# Patient Record
Sex: Male | Born: 1970 | Race: Black or African American | Hispanic: No | Marital: Married | Smoking: Former smoker
Health system: Southern US, Community
[De-identification: ages and names within clinical notes are randomized; demographics above are authoritative.]

## PROBLEM LIST (undated history)

## (undated) DIAGNOSIS — J189 Pneumonia, unspecified organism: Secondary | ICD-10-CM

## (undated) DIAGNOSIS — K219 Gastro-esophageal reflux disease without esophagitis: Secondary | ICD-10-CM

## (undated) DIAGNOSIS — E669 Obesity, unspecified: Secondary | ICD-10-CM

## (undated) DIAGNOSIS — K828 Other specified diseases of gallbladder: Secondary | ICD-10-CM

## (undated) DIAGNOSIS — G43909 Migraine, unspecified, not intractable, without status migrainosus: Secondary | ICD-10-CM

## (undated) DIAGNOSIS — K5090 Crohn's disease, unspecified, without complications: Secondary | ICD-10-CM

## (undated) DIAGNOSIS — Z8669 Personal history of other diseases of the nervous system and sense organs: Secondary | ICD-10-CM

## (undated) DIAGNOSIS — G4730 Sleep apnea, unspecified: Secondary | ICD-10-CM

## (undated) DIAGNOSIS — R0600 Dyspnea, unspecified: Secondary | ICD-10-CM

## (undated) DIAGNOSIS — T7840XA Allergy, unspecified, initial encounter: Secondary | ICD-10-CM

## (undated) DIAGNOSIS — E041 Nontoxic single thyroid nodule: Secondary | ICD-10-CM

## (undated) DIAGNOSIS — K911 Postgastric surgery syndromes: Secondary | ICD-10-CM

## (undated) DIAGNOSIS — I10 Essential (primary) hypertension: Secondary | ICD-10-CM

## (undated) DIAGNOSIS — Z87442 Personal history of urinary calculi: Secondary | ICD-10-CM

## (undated) DIAGNOSIS — F419 Anxiety disorder, unspecified: Secondary | ICD-10-CM

## (undated) DIAGNOSIS — N200 Calculus of kidney: Secondary | ICD-10-CM

## (undated) DIAGNOSIS — J3489 Other specified disorders of nose and nasal sinuses: Secondary | ICD-10-CM

## (undated) HISTORY — DX: Other specified diseases of gallbladder: K82.8

## (undated) HISTORY — PX: FLEXIBLE SIGMOIDOSCOPY: SHX1649

## (undated) HISTORY — PX: UPPER GASTROINTESTINAL ENDOSCOPY: SHX188

## (undated) HISTORY — DX: Calculus of kidney: N20.0

## (undated) HISTORY — DX: Gastro-esophageal reflux disease without esophagitis: K21.9

## (undated) HISTORY — DX: Sleep apnea, unspecified: G47.30

## (undated) HISTORY — DX: Allergy, unspecified, initial encounter: T78.40XA

## (undated) HISTORY — DX: Migraine, unspecified, not intractable, without status migrainosus: G43.909

## (undated) HISTORY — DX: Obesity, unspecified: E66.9

## (undated) HISTORY — DX: Personal history of other diseases of the nervous system and sense organs: Z86.69

## (undated) HISTORY — PX: ESOPHAGOGASTRODUODENOSCOPY: SHX1529

## (undated) HISTORY — DX: Pneumonia, unspecified organism: J18.9

## (undated) HISTORY — DX: Crohn's disease, unspecified, without complications: K50.90

## (undated) HISTORY — PX: SHOULDER SURGERY: SHX246

## (undated) HISTORY — DX: Nontoxic single thyroid nodule: E04.1

## (undated) HISTORY — DX: Other specified disorders of nose and nasal sinuses: J34.89

## (undated) HISTORY — DX: Anxiety disorder, unspecified: F41.9

## (undated) HISTORY — PX: VASECTOMY: SHX75

## (undated) HISTORY — DX: Essential (primary) hypertension: I10

---
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
## 2000-12-28 ENCOUNTER — Encounter: Admission: RE | Admit: 2000-12-28 | Discharge: 2001-01-30 | Payer: Self-pay | Admitting: *Deleted

## 2001-09-18 ENCOUNTER — Ambulatory Visit (HOSPITAL_COMMUNITY): Admission: RE | Admit: 2001-09-18 | Discharge: 2001-09-18 | Payer: Self-pay | Admitting: Gastroenterology

## 2002-04-09 ENCOUNTER — Encounter: Payer: Self-pay | Admitting: Emergency Medicine

## 2002-04-09 ENCOUNTER — Emergency Department (HOSPITAL_COMMUNITY): Admission: EM | Admit: 2002-04-09 | Discharge: 2002-04-09 | Payer: Self-pay | Admitting: Emergency Medicine

## 2002-04-12 ENCOUNTER — Encounter: Payer: Self-pay | Admitting: Emergency Medicine

## 2002-04-12 ENCOUNTER — Emergency Department (HOSPITAL_COMMUNITY): Admission: EM | Admit: 2002-04-12 | Discharge: 2002-04-12 | Payer: Self-pay | Admitting: Emergency Medicine

## 2003-07-04 HISTORY — PX: MULTIPLE TOOTH EXTRACTIONS: SHX2053

## 2004-12-29 ENCOUNTER — Emergency Department (HOSPITAL_COMMUNITY): Admission: EM | Admit: 2004-12-29 | Discharge: 2004-12-30 | Payer: Self-pay | Admitting: Emergency Medicine

## 2006-12-16 ENCOUNTER — Emergency Department (HOSPITAL_COMMUNITY): Admission: EM | Admit: 2006-12-16 | Discharge: 2006-12-16 | Payer: Self-pay | Admitting: Family Medicine

## 2007-02-09 ENCOUNTER — Ambulatory Visit: Payer: Self-pay | Admitting: Family Medicine

## 2007-02-09 ENCOUNTER — Inpatient Hospital Stay (HOSPITAL_COMMUNITY): Admission: EM | Admit: 2007-02-09 | Discharge: 2007-02-11 | Payer: Self-pay | Admitting: Emergency Medicine

## 2007-02-10 ENCOUNTER — Encounter: Payer: Self-pay | Admitting: Gastroenterology

## 2007-02-13 ENCOUNTER — Ambulatory Visit: Payer: Self-pay | Admitting: Gastroenterology

## 2009-07-03 HISTORY — PX: CHOLECYSTECTOMY: SHX55

## 2009-11-04 LAB — LIPID PANEL
HDL: 27 mg/dL — AB (ref 35–70)
LDL Cholesterol: 90 mg/dL
Triglycerides: 133 mg/dL (ref 40–160)

## 2009-12-02 ENCOUNTER — Ambulatory Visit (HOSPITAL_COMMUNITY): Admission: RE | Admit: 2009-12-02 | Discharge: 2009-12-02 | Payer: Self-pay | Admitting: Gastroenterology

## 2010-01-17 ENCOUNTER — Ambulatory Visit (HOSPITAL_COMMUNITY): Admission: RE | Admit: 2010-01-17 | Discharge: 2010-01-17 | Payer: Self-pay | Admitting: Urology

## 2010-09-18 LAB — COMPREHENSIVE METABOLIC PANEL
Albumin: 3.5 g/dL (ref 3.5–5.2)
Alkaline Phosphatase: 96 U/L (ref 39–117)
Calcium: 9.2 mg/dL (ref 8.4–10.5)
Chloride: 106 mEq/L (ref 96–112)
GFR calc Af Amer: >60 mL/min (ref >60)
GFR calc non Af Amer: >60 mL/min (ref >60)
Glucose, Bld: 97 mg/dL (ref 70–99)
Sodium: 141 mEq/L (ref 135–145)
Total Bilirubin: 0.4 mg/dL (ref 0.3–1.2)

## 2010-09-18 LAB — SURGICAL PCR SCREEN: Staphylococcus aureus: NEGATIVE

## 2010-09-18 LAB — DIFFERENTIAL
Basophils Absolute: 0 10*3/uL (ref 0.0–0.1)
Eosinophils Absolute: 0.2 10*3/uL (ref 0.0–0.7)
Lymphs Abs: 1.6 10*3/uL (ref 0.7–4.0)
Monocytes Relative: 17 % — ABNORMAL HIGH (ref 3–12)
Neutro Abs: 1.7 10*3/uL (ref 1.7–7.7)

## 2010-09-18 LAB — CBC
HCT: 43.7 % (ref 39.0–52.0)
MCH: 29.4 pg (ref 26.0–34.0)
MCV: 84.9 fL (ref 78.0–100.0)
Platelets: 188 10*3/uL (ref 150–400)
RDW: 13.9 % (ref 11.5–15.5)

## 2010-11-15 NOTE — H&P (Signed)
Craig Guzman, Craig Guzman                ACCOUNT NO.:  [id number]   MEDICAL RECORD NO.:  [id number]          PATIENT TYPE:  INP   LOCATION:  3086                         FACILITY:  [facility]   PHYSICIAN:  Kasandra Knudsen, M.D.   DATE OF BIRTH:  08/05/1970   DATE OF ADMISSION:  02/09/2007  DATE OF DISCHARGE:                              HISTORY & PHYSICAL   HISTORY OF PRESENT ILLNESS:  This is a 40 year old male with vomiting  blood and coffee grounds starting at 11:30 p.m.  He has vomited multiple  times, and cannot quantify.  This has never happened to him before.  He  was scheduled to see Dr. Collene Mares soon for a questionable EGD.  He has acid  reflux.  He felt normal self yesterday.  He complains of abdominal pain  that is mild.  He denies diarrhea.  He feels like something is caught in  his throat.  The patient is very difficult to understand due to his  mumbling.  His wife came later into the room and said that he has been  nauseous for 2 weeks with about every other day vomiting, decreased p.o.  intake and weight loss.   PAST MEDICAL HISTORY:  1. GERD.  2. Hypertension.  3. History of an antral ulcer, although it was gone on repeat EGD.   MEDICATIONS:  1. Lisinopril 20/25.  2. Verapamil 180 p.o. daily.  3. Nexium.   ALLERGIES:  ZITHROMAX, causes high blood pressure.   PAST SURGICAL HISTORY:  Endoscopy.   FAMILY HISTORY:  He has a dad with stomach problems, and his dad had an  MI in his 51s and a mild stroke.   SOCIAL HISTORY:  Lives with his wife and three kids.  He works at  [employer].  He smokes about one pack every one and a half  days.  Denies alcohol now, though he drank socially in the past.   REVIEW OF SYSTEMS:  See HPI.  In addition, he has noted weight loss, but  says he is trying to lose weight.  Normal bowel movements, no blood, no  tarry stools.  No chest pain.   PHYSICAL EXAMINATION:  VITAL SIGNS:  Temperature 97.6, heart rate 93,  blood pressure 145/94,  respiratory rate 20.  GENERAL:  Not in acute distress; however, ill and uncomfortable  appearing.  HEENT:  Pupils equal, round and react to light, extraocular muscles  intact.  The patient has dry mucous membranes.  There is mild erythema  in his throat.  No exudate.  Per the ED physician, there is edema of the  uvula; however, I was unable to examine the patient, as he refused exam  with tongue depressor.  CARDIOVASCULAR:  Regular rate and rhythm.  No rubs, gallops or murmurs.  PULMONARY:  Clear to auscultation bilaterally.  ABDOMEN:  Obese, soft, mild tender to palpation in the epigastric area.  No rebound, no guarding.  Hypoactive bowel sounds.  EXTREMITIES:  No edema, and 2+ pulses.  MUSCULOSKELETAL:  5/5 strength bilaterally in the upper and lower  extremities.  GU:  Deferred secondary to the patient's  vomiting, unable to tolerate.  SKIN:  No lesions, no contusions.  NEURO:  Alert and oriented x3.  Cranial nerves II-XII intact.   LABORATORY EVALUATION:  Gastric contents were heme positive.  Labs are  as follows:  Sodium 142, potassium 3.4, chloride 106, bicarb 26, BUN 15,  creatinine 1.3, glucose 100, AST elevated at 52, ALT 30, alk phos 92,  total bili 0.8, lipase 22.  White blood cells 7.3, hemoglobin 15.6,  hematocrit 46.9, platelets 229.   ASSESSMENT AND PLAN:  This is a 40 year old male with hematemesis.  1. Hematemesis:  Differential includes gastritis, peptic ulcer      disease, viral infection, food poisoning, Mallory-Weiss tear.      Hemodynamically stable with normal hemoglobin and hematocrit.  We      will keep n.p.o.  We will do a D5 one-half normal saline and 125      for maintenance fluids.  The patient has already received 1 liter      of fluids.  Heme positive of gastric contents could be from all of      the above.  We will start Protonix 40 b.i.d., as well as Zantac q.6      hours.  We are going to check a complete blood count in the morning      to check for  stability of hemoglobin and hematocrit.  Likely, we      will call gastrointestinal in the morning, as the patient was      established already with Dr. Collene Mares in the past.      Esophagogastroduodenoscopy may help [place] look for ulcers, tears and      gastritis.  It appears that the patient would benefit from      inpatient studies since he has been worse of late.  Zofran for      nausea and vomiting.  2. Nausea and vomiting:  Zofran 4 mg intravenous p.r.n. and p.o. with      intravenous fluids.  3. Uvula edema:  Given Benadryl in the emergency department.  Stable      without air compromise; however, we will give one dose of Solu-      Medrol now and repeat as necessary to decrease inflammation.      Telemetry bed for monitoring.  4. Bradycardia:  Mild.  We will get an electrocardiogram at 0800 to      make sure that the patient does not have prolonged QT secondary to      Zofran, as initially the patient was not bradycardic.  5. Hypertension:  Mild increased blood pressure, but the patient has      been actively vomiting.  We are going to hold the blood pressure      medicines while he is n.p.o.  If needed, we can give intravenous      hydralazine or intravenous Vasotec.  6. Prophylaxis:  Sequential compression devices and Protonix.      Kasandra Knudsen, M.D.  Electronically Signed     JT/MEDQ  D:  02/09/2007  T:  02/10/2007  Job:  [id number]

## 2010-11-18 NOTE — Discharge Summary (Signed)
Craig Guzman, Craig Guzman                ACCOUNT NO.:  [id number]   MEDICAL RECORD NO.:  [id number]          PATIENT TYPE:  INP   LOCATION:  5020                         FACILITY:  [facility]   PHYSICIAN:  Jamal Collin. Hensel, M.D.DATE OF BIRTH:  1970/08/13   DATE OF ADMISSION:  02/09/2007  DATE OF DISCHARGE:  02/11/2007                               DISCHARGE SUMMARY   PRIMARY CARE PHYSICIAN:  Dr. Carlota Raspberry at one of the urgency care centers.   CONSULTANTS:  GI, Nelwyn Salisbury, M.D.   PROCEDURES:  The patient had an EGD, which showed 2 antral ulcers and a  hiatal hernia, approximately 1 cm.  Biopsies were taken, and a CLOtest  was taken and is pending.   PERTINENT LABORATORIES:  The patient's hemoglobin on admission was 14.4,  has decreased to 13.4 and then 12.9 on discharge.  The patient glucose  was mildly elevated at 109 and 101, and this can be followed up his  primary care Donnette Macmullen.  The patient also had low albumin of 3.1 on day 2  of his hospitalization.   PRIMARY DIAGNOSES:  GI bleed with 2 antral ulcers identified.   SECONDARY DIAGNOSES:  1. Hiatal hernia.  2. Hypertension.   HOSPITAL COURSE:  1. Hematemesis and GI bleed:  The patient was admitted to the hospital      with history of nausea, vomiting, and hematemesis.  He underwent an      upper endoscopy, which showed 2 antral ulcers.  Biopsies were      taken.  CLO testing for H. pylori was done, and test is still      pending, and the patient was placed on Protonix 40 mg p.o. b.i.d.,      and then after the EGD was started on clears, and his diet was      advanced, which he tolerated well.  Also, on the day of discharge,      sucralfate was started per GI's recommendation.  2. Nausea and vomiting:  The patient was given Zofran, and his diet      was advanced.  By the time of discharge, the patient was taking      p.o. well.  3. Uvula edema:  The patient's uvula was noted to be somewhat      edematous.  He was placed on  Benadryl.  There was never any airway      compromise, and this problem resolved.  4. Bradycardia:  The patient's heart rate was in the high 40s to low      60s.  EKG showed sinus bradycardia but was otherwise normal.  There      was no increased QT.  No action was taken for this.  5. Hypertension:  The patient's blood pressure meds were held during      his hospital stay.  His blood pressure did begin to creep up      somewhat the last day of his hospital stay, up to the 150s/90s.      The patient was discontinued on his home medication regimen.  6. Hypokalemia:  The patient was  noted to be hypokalemic.  This was      repleted, and this problem resolved.   PENDING TEST RESULTS AT THE TIME OF DISCHARGE:  The CLO test for H.  pylori is pending.   CONDITION AT THE TIME OF DISCHARGE:  Stable.   DISPOSITION:  The patient was discharged to home.   DISCHARGE FOLLOWUP:  The patient was instructed to follow up with Dr.  Collene Mares, GI doctor, on September 2 at 10 a.m.  The patient was also  instructed to follow up with Dr. Carlota Raspberry at the urgent care, and the  patient was told to go back within 1 week to see Dr. Carlota Raspberry.   FOLLOWUP ISSUES:  Continued management of patient's blood pressure  medicines and monitoring of the bradycardia that we saw during this  admission and followup for patient's antral ulcers.   DISCHARGE MEDICATIONS:  The patient was discharged on the following  medications:  1. Lisinopril 20 mg p.o. daily.  2. Hydrochlorothiazide 25 mg p.o. daily.  3. Verapamil 180 mg p.o. daily.  4. Sucralfate 1 g by mouth 4 times a day, take 1 hour before meals and      at bedtime.  5. Omeprazole 40 mg tab, take 1 tab twice a day through August 13,      then take one tab per day.      Carin Hock, MD  Electronically Signed      Jamal Collin. Andria Frames, M.D.  Electronically Signed    SO/MEDQ  D:  02/12/2007  T:  02/12/2007  Job:  [id number]   cc:   Earleen Newport, M.D.

## 2010-11-18 NOTE — Procedures (Signed)
[place]  Patient:    Craig Guzman, Craig Guzman Visit Number: [id number] MRN: [id number]          Service Type: END Location: ENDO Attending Physician:  Juanita Craver Dictated by:   Nelwyn Salisbury, M.D. Proc. Date: 09/18/01 Admit Date:  09/18/2001   CC:         Sharene Butters, M.D.   Procedure Report  DATE OF BIRTH:  [birth year]  REFERRING PHYSICIAN:  Sharene Butters, M.D.  PROCEDURE PERFORMED:  Esophagogastroduodenoscopy.  ENDOSCOPIST:  Nelwyn Salisbury, M.D.  INSTRUMENT USED:  Olympus video panendoscope.  INDICATIONS FOR PROCEDURE:  The patient is a 40 year old African-American male with a history of an antral ulcer seen on a recent upper gastrointestinal series.  EGD is being done to further evaluate the patients symptoms and possibly do biopsies for Helicobacter pylori.  The patient has a history of heavy nonsteroidal use including Goody powders in the past.  PREPROCEDURE PREPARATION:  Informed consent was procured from the patient. The patient was fasted for eight hours prior to the procedure.  PREPROCEDURE PHYSICAL:  The patient is a morbidly obese African-American male in no acute distress.  The patient had stable vital signs.  Neck supple. Chest clear to auscultation.  S1, S2 regular.  Abdomen soft with normal bowel sounds.  Epigastric tenderness on palpation with minimal guarding.  No rebound, no rigidity, no hepatosplenomegaly.  DESCRIPTION OF PROCEDURE:  The patient was placed in left lateral decubitus position and sedated with 130 mg of Demerol and 13 mg of Versed intravenously. Once the patient was adequately sedated and maintained on low-flow oxygen and continuous cardiac monitoring, the Olympus video panendoscope was advanced through the mouthpiece, over the tongue, into the esophagus under direct vision.  The entire esophagus appeared normal and without lesions.  The scope was then advanced into the stomach.  There was  prominent antral gastritis with no masses or polyps seen.  The proximal small bowel appeared normal. Retroflexion was not possible as the patient was combative and pulled the scope out.  Biopsies could not be done because the patient was not cooperative.  IMPRESSION: 1. Normal-appearing esophagus and proximal small bowel. 2. Antral gastritis. 2. No ulcers or masses seen.  RECOMMENDATION: 1. Continue  proton pump inhibitors. 2. Avoid all nonsteroidals. 3. Check serology for Helicobacter pylori infection. 4. Outpatient follow-up in the next two weeks. 5. Avoid all nonsteroidals including aspirin. Dictated by:   Nelwyn Salisbury, M.D. Attending Physician:  Juanita Craver DD:  09/18/01 TD:  09/18/01 Job: [id number]

## 2011-03-29 LAB — HEPATIC FUNCTION PANEL
Alkaline Phosphatase: 90 U/L (ref 25–125)
Bilirubin, Total: 0.5 mg/dL

## 2011-04-05 ENCOUNTER — Other Ambulatory Visit: Payer: Self-pay | Admitting: Internal Medicine

## 2011-04-07 ENCOUNTER — Ambulatory Visit
Admission: RE | Admit: 2011-04-07 | Discharge: 2011-04-07 | Disposition: A | Discharge status: Home / Self Care | Payer: 59 | Source: Ambulatory Visit | Attending: Internal Medicine | Admitting: Internal Medicine

## 2011-04-12 ENCOUNTER — Encounter: Payer: Self-pay | Admitting: Internal Medicine

## 2011-04-17 LAB — DIFFERENTIAL
Basophils Absolute: 0
Basophils Relative: 0
Eosinophils Absolute: 0.2
Monocytes Relative: 10
Neutrophils Relative %: 65

## 2011-04-17 LAB — CBC
HCT: 38.5 — ABNORMAL LOW
HCT: 42.5
HCT: 46.9
Hemoglobin: 12.9 — ABNORMAL LOW
Hemoglobin: 13.4
Hemoglobin: 14.4
Hemoglobin: 15.6
MCHC: 33.3
MCHC: 34
MCV: 84.8
MCV: 85.6
Platelets: 178
Platelets: 204
Platelets: 206
RBC: 4.65
RBC: 5.05
RDW: 14
RDW: 14.1 — ABNORMAL HIGH
WBC: 5.4
WBC: 8.9

## 2011-04-17 LAB — TYPE AND SCREEN
ABO/RH(D): O POS
Antibody Screen: NEGATIVE

## 2011-04-17 LAB — BASIC METABOLIC PANEL
BUN: 7
Chloride: 110
GFR calc Af Amer: >60
GFR calc non Af Amer: >60
Glucose, Bld: 101 — ABNORMAL HIGH
Glucose, Bld: 116 — ABNORMAL HIGH
Potassium: 3.6
Potassium: 3.9
Sodium: 140

## 2011-04-17 LAB — COMPREHENSIVE METABOLIC PANEL
BUN: 11
CO2: 26
Calcium: 8.6
Chloride: 108
Creatinine, Ser: 0.88
GFR calc Af Amer: >60
Glucose, Bld: 109 — ABNORMAL HIGH
Total Bilirubin: 0.6

## 2011-04-17 LAB — I-STAT 8, (EC8 V) (CONVERTED LAB)
BUN: 15
Chloride: 106
Glucose, Bld: 100 — ABNORMAL HIGH
Operator id: 192351

## 2011-04-17 LAB — HEPATIC FUNCTION PANEL
AST: 52 — ABNORMAL HIGH
Bilirubin, Direct: 0.1
Indirect Bilirubin: 0.7

## 2011-04-17 LAB — CLOTEST (H. PYLORI), BIOPSY: Helicobacter screen: NEGATIVE

## 2011-04-17 LAB — GASTRIC OCCULT BLOOD (1-CARD TO LAB): Occult Blood, Gastric: POSITIVE — AB

## 2011-04-17 LAB — AMYLASE: Amylase: 85

## 2011-04-17 LAB — H. PYLORI ANTIBODY, IGG: H Pylori IgG: 0.6

## 2011-04-21 ENCOUNTER — Ambulatory Visit (INDEPENDENT_AMBULATORY_CARE_PROVIDER_SITE_OTHER): Payer: 59 | Admitting: Internal Medicine

## 2011-04-21 ENCOUNTER — Encounter: Payer: Self-pay | Admitting: Internal Medicine

## 2011-04-21 VITALS — BP 146/84 | HR 72 | Ht 74.0 in | Wt 287.0 lb

## 2011-04-21 DIAGNOSIS — K219 Gastro-esophageal reflux disease without esophagitis: Secondary | ICD-10-CM

## 2011-04-21 DIAGNOSIS — R1110 Vomiting, unspecified: Secondary | ICD-10-CM

## 2011-04-21 DIAGNOSIS — R6881 Early satiety: Secondary | ICD-10-CM

## 2011-04-21 NOTE — Progress Notes (Signed)
  Subjective:    Patient ID: Craig Guzman, male    DOB: 06-11-1971, 40 y.o.   MRN: [id number]  HPI 40 yo African-American man previously followed by Dr. Collene Mares for GERD. He wanted to establish with a new gastroenterologist. He is only able to "eat a certain amount". He has difficulty with regurgitating some food at the end of a meal. Chronic and intermittent. Dr. Collene Mares switched PPI therapy. He has had an EGD in past - last time had two ulcers with GI bleed. Some intermittent nausea at times, especially in early AM. Had cholecystectomy 20011 - biliary dyskinesia - with partial benefit. Has been once excellent for about 2 years now. No other GI complaints. Has had some headaches also - related to vomiting - 2 weeks ago. Migraine headaches x 5-6 years.  Has been going to school full-time and working full time. Reduced class load with headaches.  Eats small meals, irregular fashion. Weight fluctuates.  Review of Systems For allergies, back pain, fatigue insomnia. He also has anxiety issues. Describe up with what he calls migraine headaches. He said neck muscular injections for those. Topiramate was started in the summer the dose was reduced after cause some shakiness and increased anxiety and poor concentration. Followed [facility]. All other review of systems negative.    Objective:   Physical Exam General: Obese Well-developed, well-nourished and in no acute distress Vitals: Reviewed and listed above Eyes:anicteric. Mouth and posterior pharynx: normal.  Neck: supple w/o thyromegaly or mass.  Lungs: clear. Heart: S1S2, no rubs, murmurs, gallops. Abdomen: soft, non-tender, no hepatosplenomegaly, hernia, or mass and BS+. No splash Lymphatics: no cervical, Ava nodes. Extremities:  no edema Skin no rash. Neuro: nonfocal. A&O x 3.  Psych: appropriate mood and  affect.        Assessment & Plan:  Constellation of early satiety, regurgitation and reflux. History of peptic  ulcer disease though he abused and states in the past. Last endoscopy was then thousand 22. This is all in the setting of headaches, reported to be migraines. Etiology of this is not clear. Question if he has some form of gastroparesis or gastric dysrhythmia perhaps.   I'm going to have him do a gastric emptying study and review the records from his former gastroenterologist and make further determinations pending that. Possibilities include prokinetic agents such as metoclopramide though would try to avoid using those long-term. Possibility of using buspirone for functional dyspepsia problems was mentioned to the patient.

## 2011-04-21 NOTE — Patient Instructions (Addendum)
You have been given a separate informational sheet regarding your tobacco use, the importance of quitting and local resources to help you quit. You have been scheduled for a Gastric Emptying Study at [facility] on [address]. 05/02/11 at 1:00 pm, please arrive at the Radiology Dept. on the 1st floor at 12:45 pm.  Have nothing to eat or drink at least 8 hours prior to the test. Stop any stomach medications (Dexilant) 24 hours prior to test. We will call you with results and plans.

## 2011-05-02 ENCOUNTER — Encounter (HOSPITAL_COMMUNITY)
Admission: RE | Admit: 2011-05-02 | Discharge: 2011-05-02 | Disposition: A | Discharge status: Home / Self Care | Payer: 59 | Source: Ambulatory Visit | Attending: Internal Medicine | Admitting: Internal Medicine

## 2011-05-02 DIAGNOSIS — R112 Nausea with vomiting, unspecified: Secondary | ICD-10-CM | POA: Insufficient documentation

## 2011-05-02 DIAGNOSIS — K3189 Other diseases of stomach and duodenum: Secondary | ICD-10-CM | POA: Insufficient documentation

## 2011-05-02 DIAGNOSIS — R1013 Epigastric pain: Secondary | ICD-10-CM | POA: Insufficient documentation

## 2011-05-02 DIAGNOSIS — R6881 Early satiety: Secondary | ICD-10-CM

## 2011-05-02 DIAGNOSIS — K219 Gastro-esophageal reflux disease without esophagitis: Secondary | ICD-10-CM

## 2011-05-02 MED ORDER — TECHNETIUM TC 99M SULFUR COLLOID
2.0000 | Freq: Once | INTRAVENOUS | Status: AC | PRN
  Administered 2011-05-02: 2 via INTRAVENOUS

## 2011-05-02 NOTE — Progress Notes (Signed)
Quick Note:  Stomach not emptying well = ? Poor squeezing vs. Obstruction  He needs an EGD to evaluate the vomiting and early satiety and GERD - to exclude a blockage - please arrange [facility] is ok ______

## 2011-05-03 ENCOUNTER — Other Ambulatory Visit: Payer: Self-pay

## 2011-05-04 ENCOUNTER — Encounter: Payer: Self-pay | Admitting: Internal Medicine

## 2011-05-04 ENCOUNTER — Ambulatory Visit (AMBULATORY_SURGERY_CENTER): Payer: 59 | Admitting: Internal Medicine

## 2011-05-04 VITALS — BP 141/75 | HR 58 | Temp 97.0°F | Resp 18 | Ht 74.0 in | Wt 270.0 lb

## 2011-05-04 DIAGNOSIS — R933 Abnormal findings on diagnostic imaging of other parts of digestive tract: Secondary | ICD-10-CM

## 2011-05-04 DIAGNOSIS — K2970 Gastritis, unspecified, without bleeding: Secondary | ICD-10-CM

## 2011-05-04 DIAGNOSIS — R1110 Vomiting, unspecified: Secondary | ICD-10-CM

## 2011-05-04 DIAGNOSIS — R6881 Early satiety: Secondary | ICD-10-CM

## 2011-05-04 DIAGNOSIS — K219 Gastro-esophageal reflux disease without esophagitis: Secondary | ICD-10-CM

## 2011-05-04 DIAGNOSIS — K2940 Chronic atrophic gastritis without bleeding: Secondary | ICD-10-CM

## 2011-05-04 MED ORDER — SODIUM CHLORIDE 0.9 % IV SOLN: 500.0000 mL | INTRAVENOUS | Status: DC

## 2011-05-04 MED ORDER — METOCLOPRAMIDE HCL 10 MG PO TABS: 10.0000 mg | ORAL_TABLET | Freq: Three times a day (TID) | ORAL | Status: DC

## 2011-05-04 NOTE — Patient Instructions (Addendum)
The stomach looked inflamed and irritated. That is called gastritis. I took biopsies to understand it better and we will call with those results and further plans. Start metaclopramide - new medication to help with nausea and vomiting. Gatha Mayer, MD, [facility] Please refer to the neon green sheet for instructions regarding activity for the rest of today.  Handout on gastritis and a soft diet. Resume previous medications.

## 2011-05-05 ENCOUNTER — Telehealth: Payer: Self-pay

## 2011-05-05 NOTE — Telephone Encounter (Signed)

## 2011-05-12 ENCOUNTER — Telehealth: Payer: Self-pay | Admitting: Internal Medicine

## 2011-05-12 NOTE — Telephone Encounter (Signed)
Patient c.o cramping with reglan 10 mg.  He has been cutting it in half and that seems to be helping. He will continue 5 mg ac meals and will call back next week if still having problems or continued cramping.

## 2011-05-17 ENCOUNTER — Encounter: Payer: Self-pay | Admitting: Internal Medicine

## 2011-05-17 DIAGNOSIS — K5000 Crohn's disease of small intestine without complications: Secondary | ICD-10-CM | POA: Insufficient documentation

## 2011-05-17 DIAGNOSIS — D71 Functional disorders of polymorphonuclear neutrophils: Secondary | ICD-10-CM

## 2011-05-17 NOTE — Progress Notes (Signed)
This encounter was created in error - please disregard.

## 2011-05-17 NOTE — Progress Notes (Signed)
Quick Note:  Please have him get: 1) anti-neutrophilic cytoplasmic antibody panel 2) angiotensin converting enzyme level 3) CBC 4) ESR 5) VDRL 6) place PPD  All to evaluate the granulomatous gastritis - looking for cause  Use chronic granulomatous disease as dx 9on prob list) ______

## 2011-05-17 NOTE — Progress Notes (Signed)
Quick Note:  He has an inflammatory gastritis "granulomatous"  Cause is not clear  I am investigating more as to what other testing may be needed  He needs REV with me December ______

## 2011-05-18 ENCOUNTER — Other Ambulatory Visit: Payer: Self-pay

## 2011-05-18 DIAGNOSIS — D71 Functional disorders of polymorphonuclear neutrophils: Secondary | ICD-10-CM

## 2011-05-19 ENCOUNTER — Other Ambulatory Visit (INDEPENDENT_AMBULATORY_CARE_PROVIDER_SITE_OTHER): Payer: 59

## 2011-05-19 ENCOUNTER — Encounter (INDEPENDENT_AMBULATORY_CARE_PROVIDER_SITE_OTHER): Payer: 59 | Admitting: Internal Medicine

## 2011-05-19 DIAGNOSIS — D719 Functional disorders of polymorphonuclear neutrophils, unspecified: Secondary | ICD-10-CM

## 2011-05-19 DIAGNOSIS — D71 Functional disorders of polymorphonuclear neutrophils: Secondary | ICD-10-CM

## 2011-05-19 DIAGNOSIS — K219 Gastro-esophageal reflux disease without esophagitis: Secondary | ICD-10-CM

## 2011-05-19 LAB — CBC WITH DIFFERENTIAL/PLATELET
Basophils Absolute: 0 10*3/uL (ref 0.0–0.1)
Eosinophils Absolute: 0.2 10*3/uL (ref 0.0–0.7)
HCT: 42.6 % (ref 39.0–52.0)
Lymphs Abs: 1.7 10*3/uL (ref 0.7–4.0)
MCHC: 33.9 g/dL (ref 30.0–36.0)
MCV: 85.4 fl (ref 78.0–100.0)
Monocytes Absolute: 0.8 10*3/uL (ref 0.1–1.0)
Monocytes Relative: 15.3 % — ABNORMAL HIGH (ref 3.0–12.0)
Platelets: 200 10*3/uL (ref 150.0–400.0)
RDW: 14.8 % — ABNORMAL HIGH (ref 11.5–14.6)

## 2011-05-19 LAB — SEDIMENTATION RATE: Sed Rate: 17 mm/hr (ref 0–22)

## 2011-05-20 LAB — RPR

## 2011-05-20 LAB — ANGIOTENSIN CONVERTING ENZYME: Angiotensin-Converting Enzyme: 6 U/L — ABNORMAL LOW (ref 8–52)

## 2011-05-21 NOTE — Progress Notes (Signed)
This encounter was created in error - please disregard.

## 2011-05-22 ENCOUNTER — Emergency Department (HOSPITAL_COMMUNITY)
Admission: EM | Admit: 2011-05-22 | Discharge: 2011-05-23 | Disposition: A | Discharge status: Home / Self Care | Payer: 59 | Attending: Emergency Medicine | Admitting: Emergency Medicine

## 2011-05-22 ENCOUNTER — Encounter (HOSPITAL_COMMUNITY): Payer: Self-pay | Admitting: *Deleted

## 2011-05-22 ENCOUNTER — Encounter: Payer: Self-pay | Admitting: Internal Medicine

## 2011-05-22 DIAGNOSIS — H53149 Visual discomfort, unspecified: Secondary | ICD-10-CM | POA: Insufficient documentation

## 2011-05-22 DIAGNOSIS — E669 Obesity, unspecified: Secondary | ICD-10-CM | POA: Insufficient documentation

## 2011-05-22 DIAGNOSIS — I10 Essential (primary) hypertension: Secondary | ICD-10-CM | POA: Insufficient documentation

## 2011-05-22 DIAGNOSIS — R110 Nausea: Secondary | ICD-10-CM | POA: Insufficient documentation

## 2011-05-22 DIAGNOSIS — K219 Gastro-esophageal reflux disease without esophagitis: Secondary | ICD-10-CM | POA: Insufficient documentation

## 2011-05-22 DIAGNOSIS — G43909 Migraine, unspecified, not intractable, without status migrainosus: Secondary | ICD-10-CM | POA: Insufficient documentation

## 2011-05-22 DIAGNOSIS — K2940 Chronic atrophic gastritis without bleeding: Secondary | ICD-10-CM | POA: Insufficient documentation

## 2011-05-22 LAB — TB SKIN TEST
Induration: 0
TB Skin Test: NEGATIVE mm

## 2011-05-22 NOTE — ED Notes (Signed)
Headache since 1845 he has headaches.  Nausea no vomiting.  He has to have shots for the pain

## 2011-05-23 MED ORDER — PROMETHAZINE HCL 25 MG/ML IJ SOLN
25.0000 mg | Freq: Once | INTRAMUSCULAR | Status: AC
  Administered 2011-05-23: 25 mg via INTRAMUSCULAR
  Filled 2011-05-23: qty 1

## 2011-05-23 MED ORDER — KETOROLAC TROMETHAMINE 30 MG/ML IJ SOLN
60.0000 mg | Freq: Once | INTRAMUSCULAR | Status: AC
  Administered 2011-05-23: 60 mg via INTRAMUSCULAR
  Filled 2011-05-23 (×2): qty 1

## 2011-05-23 NOTE — ED Notes (Signed)
Pt states that he has a history of migraines. Pt states that he has had a HA since 18:30 this afternoon. Pt states that he tried to take his migraine medication with no relief. Pt states that his HA wraps around his head and is mainly in his occipital area. Pt states he is having light sensitivity and noise sensitivity. Pt alert and oriented and able to follow commands and move extremities.

## 2011-05-23 NOTE — ED Provider Notes (Addendum)
History     CSN: [id number] Arrival date & time: 05/22/2011 10:19 PM   First MD Initiated Contact with Patient 05/23/11 0052      Chief Complaint  Patient presents with  . Headache    (Consider location/radiation/quality/duration/timing/severity/associated sxs/prior treatment) HPI This is a 40 year old black male with a history of migraines. He had a migraine onset yesterday evening about 6:30. The pain is like previous migraines, moderate to severe in intensity. The pain is present in his for head bilaterally and is dull. It is also present in his parietal areas bilaterally which is throbbing. The headache is associated with photophobia and nausea but no vomiting. He denies any focal neurologic deficits. He is not aware of any visual changes but states he has not been wearing his glasses since the headache started so his vision is blurry. He states his headaches usually resolve with cyclobenzaprine, which he took. When this is our he states he usually gets relief with Phenergan and Toradol IM.  Past Medical History  Diagnosis Date  . GERD (gastroesophageal reflux disease)   . HTN (hypertension)   . Gastric ulcer     antral  . Hiatal hernia   . Biliary dyskinesia   . Anxiety   . Obesity   . History of migraine headaches   . Chronic gastritis     granulomatous  . Head ache     Past Surgical History  Procedure Date  . Cholecystectomy 2011    [place]  . Vasectomy     bilateral w/lysis of penale adhesions  . Esophagogastroduodenoscopy 2008, 05/04/11    2008:antral ulcers, hiatal hernia 2012: granulomatous gastritis    Family History  Problem Relation Age of Onset  . Colon polyps Father   . Diabetes Father   . Colon cancer Neg Hx     History  Substance Use Topics  . Smoking status: Current Everyday Smoker -- 0.5 packs/day for 10 years  . Smokeless tobacco: Never Used   Comment: Counseling sheet given in exam room   . Alcohol Use: No      Review of Systems    All other systems reviewed and are negative.    Allergies  Zithromax  Home Medications   Current Outpatient Rx  Name Route Sig Dispense Refill  . CITALOPRAM HYDROBROMIDE 20 MG PO TABS  One tablet by mouth once daily     . CYCLOBENZAPRINE HCL 10 MG PO TABS  As needed     . DEXILANT 60 MG PO CPDR  One daily     . LISINOPRIL-HYDROCHLOROTHIAZIDE 20-12.5 MG PO TABS Oral Take 2 tablets by mouth daily.      Marland Kitchen METOCLOPRAMIDE HCL 10 MG PO TABS Oral Take 5 mg by mouth 3 (three) times daily.      Marland Kitchen ONDANSETRON 4 MG PO TBDP  As needed     . TOPIRAMATE 25 MG PO TABS  50 mg at bedtime     . VERAPAMIL HCL 180 MG (CO) PO TB24 Oral Take 180 mg by mouth 2 (two) times daily.       BP 137/82  Pulse 55  Temp(Src) 98.1 F (36.7 C) (Oral)  Resp 16  SpO2 99%  Physical Exam General: Well-developed, well-nourished male in no acute distress; appearance consistent with age of record HENT: normocephalic, atraumatic Eyes: Photophobia Neck: supple Heart: regular rate and rhythm Lungs: clear to auscultation bilaterally Abdomen: soft; nondistended Extremities: No deformity; full range of motion Neurologic: Awake, alert and oriented; motor function intact in all  extremities and symmetric; no facial droop; normal speech and coordination Skin: Warm and dry     ED Course  Procedures (including critical care time)    MDM  2:40 AM Feels much better after IM Phenergan and Toradol. States is related to home. Photophobia is resolved.        Wynetta Fines, MD 05/23/11 [place], MD 05/23/11 [phone]

## 2011-05-24 ENCOUNTER — Telehealth: Payer: Self-pay | Admitting: Internal Medicine

## 2011-05-24 MED ORDER — PREDNISONE 10 MG PO TABS: 40.0000 mg | ORAL_TABLET | Freq: Every day | ORAL | Status: AC

## 2011-05-24 NOTE — Telephone Encounter (Signed)
I explained that lab work-up for granulomatous gastritis was negative at this tie. We may need additional ANCA antibodies but he is still struggling with epigastric pain and vomiting.  Metaclopramide helps some.  He is also now constipated.  1) Prednisone 40 mg day to start and they are to call in 1 week with update 2) MiraLax twice a day 3) Liquid, soft diet discussed.

## 2011-06-07 ENCOUNTER — Ambulatory Visit (INDEPENDENT_AMBULATORY_CARE_PROVIDER_SITE_OTHER): Payer: 59 | Admitting: Internal Medicine

## 2011-06-07 ENCOUNTER — Encounter: Payer: Self-pay | Admitting: Internal Medicine

## 2011-06-07 DIAGNOSIS — D71 Functional disorders of polymorphonuclear neutrophils: Secondary | ICD-10-CM

## 2011-06-07 DIAGNOSIS — K5900 Constipation, unspecified: Secondary | ICD-10-CM | POA: Insufficient documentation

## 2011-06-07 MED ORDER — DICYCLOMINE HCL 20 MG PO TABS: 20.0000 mg | ORAL_TABLET | Freq: Four times a day (QID) | ORAL | Status: DC | PRN

## 2011-06-07 MED ORDER — POLYETHYLENE GLYCOL 3350 17 GM/SCOOP PO POWD: 17.0000 g | Freq: Every day | ORAL | Status: AC

## 2011-06-07 NOTE — Patient Instructions (Addendum)
You have been given a Gastroparesis Diet to review and follow, please follow step 3. Your prescription(s) has(have) been sent to your pharmacy for you to pick up (Bentyl). Try taking at bedtime to see if that helps. You have been started on Miralax please take as prescribed on medication list. You can purchase this over the counter. Return to see [name] in 1 month. You have been given a work note for today

## 2011-06-07 NOTE — Progress Notes (Signed)
Subjective:    Patient ID: Craig Guzman, male    DOB: 05/31/1971, 40 y.o.   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 I had put him on metoclopramide initially but that caused a fair amount of cramping. He eventually stopped that. The cramping is better though not gone. He is still having problems in the middle night for a couple of hours which is helped by eating some food. He is not vomiting. He thinks the prednisone is helping since starting that about 10 days ago. He is constipated only moving his bowels every few days or so which has been a problem for a while but has become more apparent. He feels drained after a bowel movement. He denies any blood in the stool or rectal bleeding. Size of stool is smaller and sticky. Using apples and apple juice to help with constipation. Allergies  Allergen Reactions  . Zithromax (Azithromycin) Hypertension   Outpatient Prescriptions Prior to Visit  Medication Sig Dispense Refill  . citalopram (CELEXA) 20 MG tablet One tablet by mouth once daily       . cyclobenzaprine (FLEXERIL) 10 MG tablet As needed       . DEXILANT 60 MG capsule One daily       . lisinopril-hydrochlorothiazide (PRINZIDE,ZESTORETIC) 20-12.5 MG per tablet Take 2 tablets by mouth daily.        . ondansetron (ZOFRAN-ODT) 4 MG disintegrating tablet As needed       . topiramate (TOPAMAX) 25 MG tablet 50 mg at bedtime       . verapamil (COVERA HS) 180 MG (CO) 24 hr tablet Take 180 mg by mouth 2 (two) times daily.       . metoCLOPramide (REGLAN) 10 MG tablet Take 5 mg by mouth 3 (three) times daily.         Past Medical History  Diagnosis Date  . GERD (gastroesophageal reflux disease)   . HTN (hypertension)   . Gastric ulcer     antral  . Hiatal hernia   . Biliary dyskinesia   . Anxiety   . Obesity   . History of migraine headaches   . Chronic gastritis     granulomatous   Past Surgical History  Procedure Date  . Cholecystectomy 2011      Rosenbower  . Vasectomy     bilateral w/lysis of penale adhesions  . Esophagogastroduodenoscopy 05/04/11    gastritis   History   Social History  . Marital Status: Married    Spouse Name: N/A    Number of Children: 3  . Years of Education: N/A   Occupational History  . [employer]    Social History Main Topics  . Smoking status: Current Everyday Smoker -- 0.5 packs/day for 10 years    Types: Cigarettes  . Smokeless tobacco: Never Used   Comment: Counseling sheet given in exam room   . Alcohol Use: No  . Drug Use: No  . Sexually Active: None   Other Topics Concern  . None   Social History Narrative  . None   Family History  Problem Relation Age of Onset  . Colon polyps Father   . Diabetes Father   . Colon cancer Neg Hx         Review of Systems Some insomnia worsened with the prednisone    Objective:   Physical Exam Obese no acute distress Abdomen is soft nontender bowel sounds are present       Assessment &  Plan:  Granulomatous gastritis is working diagnosis. He may be responding to the prednisone. We'll continue that, treat the constipation and see him in a month. I will confer with another gastroenterologist about this diagnosis as well. She problem oriented charting also.

## 2011-06-07 NOTE — Assessment & Plan Note (Signed)
Improved somewhat on the prednisone. Not certain if this is cause-and-effect yet we'll continue that and try to find out more about this disorder and by consulting the [facility] physician over the phone first.

## 2011-06-07 NOTE — Assessment & Plan Note (Signed)
This problem raises the question of other bowel involvement. For the time being we are going to continue the prednisone and had been using MiraLax. Colonoscopy may be indicated here.

## 2011-06-22 ENCOUNTER — Telehealth: Payer: Self-pay | Admitting: Internal Medicine

## 2011-06-22 DIAGNOSIS — R109 Unspecified abdominal pain: Secondary | ICD-10-CM

## 2011-06-22 NOTE — Telephone Encounter (Signed)
Spoke to wife Patient still having upper abdominal pain and nausea and vomiting Constipation still a problem  Remains on prednisone 40 mg daily but he did skip a couple of days ? If making him worse  Working about 2 out of every 5 days work week   Told his wife we will order a CT scan abdomen and pelvis with IV and oral contrast re: abdominal pain (upper - 789,09) with nausea and vomiting and constipation  Has a granulomatous gastritis on EGD

## 2011-06-23 NOTE — Telephone Encounter (Signed)
Patient is scheduled for a CT scan on 06/26/11.  He will come pick up instructions and contrast.

## 2011-06-26 ENCOUNTER — Ambulatory Visit (INDEPENDENT_AMBULATORY_CARE_PROVIDER_SITE_OTHER)
Admission: RE | Admit: 2011-06-26 | Discharge: 2011-06-26 | Disposition: A | Discharge status: Home / Self Care | Payer: 59 | Source: Ambulatory Visit | Attending: Internal Medicine | Admitting: Internal Medicine

## 2011-06-26 DIAGNOSIS — R109 Unspecified abdominal pain: Secondary | ICD-10-CM

## 2011-06-26 DIAGNOSIS — K5900 Constipation, unspecified: Secondary | ICD-10-CM

## 2011-06-26 DIAGNOSIS — R112 Nausea with vomiting, unspecified: Secondary | ICD-10-CM

## 2011-06-26 MED ORDER — IOHEXOL 300 MG/ML  SOLN
100.0000 mL | Freq: Once | INTRAMUSCULAR | Status: AC | PRN
  Administered 2011-06-26: 100 mL via INTRAVENOUS

## 2011-06-26 NOTE — Progress Notes (Signed)
Quick Note:  This looks ok Needs to stay on current dose of prednisone on a daily basis I know he has an REV coming up but I think EGD and colonoscopy to follow-up the granulomatous gastritis and evaluate change in bowel habits ? Crohn's disease) is going to make sense later in January - he could schedule that instead of the REV if he wants   ______

## 2011-06-28 ENCOUNTER — Inpatient Hospital Stay: Admission: RE | Admit: 2011-06-28 | Payer: 59 | Source: Ambulatory Visit

## 2011-07-11 ENCOUNTER — Ambulatory Visit: Payer: 59 | Admitting: Internal Medicine

## 2011-07-18 ENCOUNTER — Ambulatory Visit (AMBULATORY_SURGERY_CENTER): Payer: 59 | Admitting: *Deleted

## 2011-07-18 ENCOUNTER — Encounter: Payer: Self-pay | Admitting: Internal Medicine

## 2011-07-18 VITALS — Ht 74.0 in | Wt 305.0 lb

## 2011-07-18 DIAGNOSIS — K5900 Constipation, unspecified: Secondary | ICD-10-CM

## 2011-07-18 DIAGNOSIS — Z1211 Encounter for screening for malignant neoplasm of colon: Secondary | ICD-10-CM

## 2011-07-18 DIAGNOSIS — D71 Functional disorders of polymorphonuclear neutrophils: Secondary | ICD-10-CM

## 2011-07-18 MED ORDER — PEG-KCL-NACL-NASULF-NA ASC-C 100 G PO SOLR: ORAL | Status: DC

## 2011-07-26 ENCOUNTER — Encounter: Payer: Self-pay | Admitting: Internal Medicine

## 2011-07-26 ENCOUNTER — Ambulatory Visit (AMBULATORY_SURGERY_CENTER): Payer: 59 | Admitting: Internal Medicine

## 2011-07-26 VITALS — BP 138/96 | HR 73 | Resp 14 | Ht 74.0 in | Wt 305.0 lb

## 2011-07-26 DIAGNOSIS — K5289 Other specified noninfective gastroenteritis and colitis: Secondary | ICD-10-CM

## 2011-07-26 DIAGNOSIS — K5900 Constipation, unspecified: Secondary | ICD-10-CM

## 2011-07-26 DIAGNOSIS — K2990 Gastroduodenitis, unspecified, without bleeding: Secondary | ICD-10-CM

## 2011-07-26 DIAGNOSIS — R198 Other specified symptoms and signs involving the digestive system and abdomen: Secondary | ICD-10-CM

## 2011-07-26 DIAGNOSIS — K2970 Gastritis, unspecified, without bleeding: Secondary | ICD-10-CM

## 2011-07-26 DIAGNOSIS — D71 Functional disorders of polymorphonuclear neutrophils: Secondary | ICD-10-CM

## 2011-07-26 DIAGNOSIS — K633 Ulcer of intestine: Secondary | ICD-10-CM

## 2011-07-26 DIAGNOSIS — Z1211 Encounter for screening for malignant neoplasm of colon: Secondary | ICD-10-CM

## 2011-07-26 HISTORY — PX: COLONOSCOPY: SHX174

## 2011-07-26 MED ORDER — PREDNISONE 10 MG PO TABS: 20.0000 mg | ORAL_TABLET | Freq: Every day | ORAL | Status: DC

## 2011-07-26 MED ORDER — SODIUM CHLORIDE 0.9 % IV SOLN: 500.0000 mL | INTRAVENOUS | Status: DC

## 2011-07-26 NOTE — Patient Instructions (Addendum)
The stomach inflammation looked better. There were two tiny areas of inflammation seen in the small and large intestine. Once the biopsies are back we will call. I sent another prednisone prescription to your pharmacy for 20 mg daily. Do not stop that unless I tell you to - call if ?'s about that. Gatha Mayer, MD, [facility]  Green and blue discharge instructions reviewed with patient and care partner.  Resume medications as you were taking them prior to your procedure. Take prednisone as prescribe by [name].

## 2011-07-26 NOTE — Progress Notes (Signed)
Patient did not experience any of the following events: a burn prior to discharge; a fall within the facility; wrong site/side/patient/procedure/implant event; or a hospital transfer or hospital admission upon discharge from the facility. (G8907) Patient did not have preoperative order for IV antibiotic SSI prophylaxis. (G8918)  

## 2011-07-26 NOTE — Progress Notes (Signed)
The pt was gagging and coughing with the scope advancement down the esophagus.  Medications were ordered by [name].  The tolerated the exam. Maw  The pt tolerated the colonoscopy well. maw

## 2011-07-26 NOTE — Op Note (Signed)
[facility]. [place], [place]  [id number]  COLONOSCOPY PROCEDURE REPORT  PATIENT:  Craig Guzman, Craig Guzman  MR#:  [id number] BIRTHDATE:  Sep 06, 1970, 40 yrs. old  GENDER:  male ENDOSCOPIST:  Gatha Mayer, MD, [facility]  PROCEDURE DATE:  07/26/2011 PROCEDURE:  Colonoscopy with biopsy ASA CLASS:  Class II INDICATIONS:  change in bowel habits ? Crohn's disease with granulomatous gastritis known MEDICATIONS:   These medications were titrated to patient response per physician's verbal order, There was residual sedation effect present from prior procedure., Fentanyl 50 mcg IV, Versed 4 mg IV  DESCRIPTION OF PROCEDURE:   After the risks benefits and alternatives of the procedure were thoroughly explained, informed consent was obtained.  Digital rectal exam was performed and revealed no abnormalities and normal prostate.   The LB [id number] endoscope was introduced through the anus and advanced to the terminal ileum which was intubated for a short distance, without limitations.  The quality of the prep was excellent, using MoviPrep.  The instrument was then slowly withdrawn as the colon was fully examined. <<PROCEDUREIMAGES>>  FINDINGS:  An ulcer was found terminal ileum One aphthous ulcer and a few red spots. Multiple biopsies were obtained and sent to pathology.  Ulcers in the cecum. In area of appendix - several tiny aphthous ulcers. Multiple biopsies were obtained and sent to pathology.  This was otherwise a normal examination of the colon. Retroflexed views in the rectum revealed no abnormalities.    The time to cecum = 2:21 minutes. The scope was then withdrawn in 10:45 minutes from the cecum and the procedure completed. COMPLICATIONS:  None ENDOSCOPIC IMPRESSION: 1) Ulcer in the terminal ileum 2) Ulcers in the cecum 3) Otherwise normal examination RECOMMENDATIONS: 1) Await biopsy results Will notify restart prednisone at 20 mg daily  Gatha Mayer, MD,  [facility]  CC:  Merri Ray, MD and The Patient  n. eSIGNED:   Gatha Mayer at 07/26/2011 03:23 PM  Wyn Quaker, [phone]

## 2011-07-26 NOTE — Op Note (Signed)
[facility]. [place], [place]  [id number]  ENDOSCOPY PROCEDURE REPORT  PATIENT:  Craig, Guzman  MR#:  [id number] BIRTHDATE:  1971/05/16, 40 yrs. old  GENDER:  male  ENDOSCOPIST:  Gatha Mayer, MD, [facility]  PROCEDURE DATE:  07/26/2011 PROCEDURE:  EGD with biopsy, [address] ASA CLASS:  Class II INDICATIONS:  Re-assess granulomatous gastritis after prednisone therapy  MEDICATIONS:   These medications were titrated to patient response per physician's verbal order, Benadryl 25 mg IV, Fentanyl 50 mcg, Versed 8 mg IV TOPICAL ANESTHETIC:  Cetacaine Spray  DESCRIPTION OF PROCEDURE:   After the risks benefits and alternatives of the procedure were thoroughly explained, informed consent was obtained.  The LB GIF-H180 [id number] endoscope was introduced through the mouth and advanced to the second portion of the duodenum, without limitations.  The instrument was slowly withdrawn as the mucosa was fully examined. <<PROCEDUREIMAGES>>  Moderate gastritis was found in the antrum. Erythema, scattered aphthous ulcers and friable edematous mucosa in most of antrum. Looks less severe than before prednisone. Multiple biopsies were obtained and sent to pathology.  Otherwise the examination was normal.    Retroflexed views revealed no abnormalities.    The scope was then withdrawn from the patient and the procedure completed.  COMPLICATIONS:  None  ENDOSCOPIC IMPRESSION: 1) Moderate gastritis in the antrum - biopsied 2) Otherwise normal examination RECOMMENDATIONS: Prednisone 20 mg daily MAC sedation with propofol next exam (retching and gagging) Will notify with results and plans via phone  Gatha Mayer, MD, [facility]  CC:  Merri Ray, MD The Patient  n. Lorrin Mais:   Gatha Mayer at 07/26/2011 03:16 PM  Wyn Quaker, [phone]

## 2011-07-27 ENCOUNTER — Telehealth: Payer: Self-pay | Admitting: *Deleted

## 2011-07-27 NOTE — Telephone Encounter (Signed)

## 2011-08-01 ENCOUNTER — Encounter: Payer: Self-pay | Admitting: Internal Medicine

## 2011-08-01 NOTE — Progress Notes (Signed)
Quick Note:  Office:  Let him/wife know that it looks like he has Crohn's disease  Need to discuss other treatment - office visit needed  [facility]:  No letter or recall  ______

## 2011-08-08 ENCOUNTER — Ambulatory Visit (INDEPENDENT_AMBULATORY_CARE_PROVIDER_SITE_OTHER): Payer: 59 | Admitting: Internal Medicine

## 2011-08-08 ENCOUNTER — Encounter: Payer: Self-pay | Admitting: Internal Medicine

## 2011-08-08 ENCOUNTER — Other Ambulatory Visit: Payer: 59

## 2011-08-08 VITALS — BP 140/98 | HR 72 | Ht 74.0 in | Wt 307.0 lb

## 2011-08-08 DIAGNOSIS — Z7960 Long term (current) use of unspecified immunomodulators and immunosuppressants: Secondary | ICD-10-CM | POA: Insufficient documentation

## 2011-08-08 DIAGNOSIS — Z79899 Other long term (current) drug therapy: Secondary | ICD-10-CM

## 2011-08-08 DIAGNOSIS — K5090 Crohn's disease, unspecified, without complications: Secondary | ICD-10-CM

## 2011-08-08 NOTE — Progress Notes (Signed)
Subjective:    Patient ID: Craig Guzman, male    DOB: [birth year], 41 y.o.   MRN: [id number]  HPI 41 yo AA man here to follow-up for newy diagnosed Crohn's disease of stomach and ileum/cecum. He has vomited one time since the colonoscopy. Moving his bowels at least every other day. No bleeding reported. Abdominal pain - nocturnal or early AM - epigastric and periumbilical. Severe once a week with an attack. Cramps and doubles over. Lasts just for a few seconds.  Allergies  Allergen Reactions  . Zithromax (Azithromycin) Hypertension   Outpatient Prescriptions Prior to Visit  Medication Sig Dispense Refill  . citalopram (CELEXA) 20 MG tablet One tablet by mouth once daily       . cyclobenzaprine (FLEXERIL) 10 MG tablet As needed       . DEXILANT 60 MG capsule One daily       . lisinopril-hydrochlorothiazide (PRINZIDE,ZESTORETIC) 20-12.5 MG per tablet Take 2 tablets by mouth daily.        . ondansetron (ZOFRAN-ODT) 4 MG disintegrating tablet As needed       . predniSONE (DELTASONE) 10 MG tablet Take 2 tablets (20 mg total) by mouth daily.  100 tablet  1  . topiramate (TOPAMAX) 25 MG tablet 50 mg at bedtime       . verapamil (COVERA HS) 180 MG (CO) 24 hr tablet Take 180 mg by mouth 2 (two) times daily.        Facility-Administered Medications Prior to Visit  Medication Dose Route Frequency Provider Last Rate Last Dose  . 0.9 %  sodium chloride infusion  500 mL Intravenous Continuous Gatha Mayer, MD       Past Medical History  Diagnosis Date  . GERD (gastroesophageal reflux disease)   . HTN (hypertension)   . Gastric ulcer     antral  . Hiatal hernia   . Biliary dyskinesia   . Anxiety   . Obesity   . History of migraine headaches   . Chronic gastritis     granulomatous  . Allergy     trees/ grasses/ animals/dust/mold  . Crohn's gastritis and ileo-colitis    Past Surgical History  Procedure Date  . Cholecystectomy 2011    Rosenbower  . Vasectomy     bilateral w/lysis of  penile adhesions  . Esophagogastroduodenoscopy 05/04/11, 07/26/11    granulomatous gastritis - Crohn's  . Multiple tooth extractions 2005  . Colonoscopy 07/26/11    Crohn's colitis, ileitis   History   Social History  . Marital Status: Married    Spouse Name: N/A    Number of Children: 3  . Years of Education: N/A   Occupational History  . [employer]    Social History Main Topics  . Smoking status: Current Everyday Smoker -- 0.5 packs/day for 10 years    Types: Cigarettes  . Smokeless tobacco: Former Systems developer   Comment: Counseling sheet given in exam room   . Alcohol Use: No  . Drug Use: No  .     Other Topics Concern  . None   Social History Narrative  . None   Family History  Problem Relation Age of Onset  . Colon polyps Father   . Diabetes Father   . Colon cancer Neg Hx   . Hypertension Father   . Hypertension Mother        Review of Systems Sleeping better but still some insomnia.     Objective:   Physical Exam General:  NAD Eyes:  anicteric Lungs:  clear Heart:  S1S2 no rubs, murmurs or gallops Abdomen:  soft and nontender, BS+             Assessment & Plan:

## 2011-08-08 NOTE — Assessment & Plan Note (Addendum)
Plan to start immunomodulator therapy. I reviewed the risks benefits and indications of this therapy and biologic therapy. Long-term prednisone therapy is not a good choice. Mesalamine is not adequate for Crohn's of the upper GI tract.   He will need TPMT phenotype test to determine dosing of the immunomodulators. I have explained the need for chronic lab testing. I reviewed risks of possible lymphoma or leukemia, leukopenia, hepatitis pancreatitis and increased risk of infections.  Will evaluate for hepatitis A and hepatitis B with antibody testing. He says he's been vaccinated for hepatitis B. He should consider a zoster vaccine, I need to check on the length of his steroid treatment to see if he is okay for that at this point. Have recommended he get a Pneumovax and the Tdap booster from primary care. PPD was negative in late 2012.  Once lab testing is in will determine timing of vaccinations and starting immunomodulator therapy. I've given him a handout from the [facility] about immunomodulator therapy as well. His wife is here also.

## 2011-08-08 NOTE — Patient Instructions (Addendum)
Please go to the basement upon leaving today to have your labs done. Please reduce your prednisone to 15 mg daily. Please get a pneumonia and Tdap vaccine and bring the dates back to our office at your next office visit. Return to see [name] in 2 months. Crohn's handout given for you to read and follow.

## 2011-08-08 NOTE — Assessment & Plan Note (Signed)
You're at the beginning of initiating long-term immunomodulator therapy for his Crohn's disease.

## 2011-08-15 ENCOUNTER — Telehealth: Payer: Self-pay | Admitting: Gastroenterology

## 2011-08-15 ENCOUNTER — Encounter: Payer: Self-pay | Admitting: Internal Medicine

## 2011-08-15 NOTE — Telephone Encounter (Signed)
Called [facility] labs to check on 08/08/11 lab results, they stated they would put results in [place] and fax. Waiting on results.

## 2011-08-15 NOTE — Telephone Encounter (Signed)
Message copied by Antonieta Pert on Tue Aug 15, 2011  2:27 PM ------      Message from: Gatha Mayer      Created: Tue Aug 15, 2011  8:00 AM      Regarding: Hepatitis A and B antibodies       Please check on the results of these from 2/5 - seems like they are overdue?

## 2011-08-17 ENCOUNTER — Encounter: Payer: Self-pay | Admitting: Internal Medicine

## 2011-08-17 ENCOUNTER — Telehealth: Payer: Self-pay

## 2011-08-17 DIAGNOSIS — K7460 Unspecified cirrhosis of liver: Secondary | ICD-10-CM

## 2011-08-17 MED ORDER — MERCAPTOPURINE 50 MG PO TABS: ORAL_TABLET | ORAL | Status: DC

## 2011-08-17 NOTE — Telephone Encounter (Signed)
Results received

## 2011-08-17 NOTE — Telephone Encounter (Signed)
Message copied by Marlon Pel on Thu Aug 17, 2011  2:48 PM ------      Message from: Silvano Rusk E      Created: Thu Aug 17, 2011  1:04 PM      Regarding: START 6 mp       Let him/wife know that testing is back (had to get a paper copy from [place] on hepatitis serologies - not resulted in EMR yet - they need to do that            He should reduce prednisone to 15 mg (1 and 1/2 tab)  daily for 2 weeks and then go to 10 mg (1 tab) daily             He cannot have the Zoster vaccine while on current dose of steroids, if he is doing ok we can wean the steroids down further and he could get the vaccine but he will need to stay off immunosuppressants for 30 days after - not sure he will tolerate that.            So I suggest he start 6MP 125 mg daily and have a CBC in 1-2 weeks and then will arrange further labs from there.            He should schedule a follow-up visit for April.            Will need Hepatitis A vaccine but not urgent - is supposed to get Tdap and Pneumovax via primary care.            Let me know if he is having problems (please check) - he had ben improving - if not still ok then will hold on reducing prednisone

## 2011-08-17 NOTE — Telephone Encounter (Signed)
Patient's wife advised of the results and Dr Celesta Aver recommendations he is doing about the same.  She will have the patient get all vaccines at his primary care MD.  I will copy Dr Carlota Raspberry on this note.  I have sent myself a reminder to contact the patient about labs in 2 weeks and to schedule an office visit for April when the new schedule comes out.  She will have Mr Tyrique call back for any questions or concerns.  He will pick up 67m today and get started.

## 2011-08-21 ENCOUNTER — Other Ambulatory Visit: Payer: Self-pay | Admitting: Family Medicine

## 2011-08-21 MED ORDER — CITALOPRAM HYDROBROMIDE 20 MG PO TABS: 20.0000 mg | ORAL_TABLET | Freq: Every day | ORAL | Status: DC

## 2011-08-25 ENCOUNTER — Other Ambulatory Visit: Payer: Self-pay | Admitting: Family Medicine

## 2011-08-31 ENCOUNTER — Encounter: Payer: Self-pay | Admitting: *Deleted

## 2011-09-01 ENCOUNTER — Encounter: Payer: Self-pay | Admitting: Family Medicine

## 2011-09-01 ENCOUNTER — Ambulatory Visit (INDEPENDENT_AMBULATORY_CARE_PROVIDER_SITE_OTHER): Payer: 59 | Admitting: Family Medicine

## 2011-09-01 DIAGNOSIS — I10 Essential (primary) hypertension: Secondary | ICD-10-CM

## 2011-09-01 DIAGNOSIS — K5090 Crohn's disease, unspecified, without complications: Secondary | ICD-10-CM

## 2011-09-01 DIAGNOSIS — Z23 Encounter for immunization: Secondary | ICD-10-CM

## 2011-09-01 DIAGNOSIS — Z0000 Encounter for general adult medical examination without abnormal findings: Secondary | ICD-10-CM

## 2011-09-01 DIAGNOSIS — R351 Nocturia: Secondary | ICD-10-CM

## 2011-09-01 LAB — LIPID PANEL
Cholesterol: 171 mg/dL (ref 0–200)
HDL: 32 mg/dL — ABNORMAL LOW (ref >39)
Total CHOL/HDL Ratio: 5.3 Ratio
Triglycerides: 156 mg/dL — ABNORMAL HIGH (ref <150)

## 2011-09-01 LAB — COMPREHENSIVE METABOLIC PANEL
AST: 20 U/L (ref 0–37)
Albumin: 4.1 g/dL (ref 3.5–5.2)
BUN: 15 mg/dL (ref 6–23)
CO2: 23 mEq/L (ref 19–32)
Calcium: 9.3 mg/dL (ref 8.4–10.5)
Chloride: 106 mEq/L (ref 96–112)
Creat: 0.98 mg/dL (ref 0.50–1.35)
Glucose, Bld: 90 mg/dL (ref 70–99)

## 2011-09-01 MED ORDER — LISINOPRIL-HYDROCHLOROTHIAZIDE 20-12.5 MG PO TABS: 2.0000 | ORAL_TABLET | Freq: Every day | ORAL | Status: DC

## 2011-09-01 MED ORDER — VERAPAMIL HCL 180 MG (CO) PO TB24: 180.0000 mg | ORAL_TABLET | Freq: Two times a day (BID) | ORAL | Status: DC

## 2011-09-01 NOTE — Progress Notes (Signed)
  Subjective:    Patient ID: Craig Guzman, male    DOB: Nov 24, 1970, 41 y.o.   MRN: [id number]  HPI Craig Guzman is a 41 y.o. male Here for CPE.  Last physical with me December 2010. Multiple Ov's past year various sx's - ultimately  diagnosed with Crohn's by [name] in January.  Told needs vaccines that are not live.  Needs tetanus, pneumonia vaccines, hepatitis A vaccination. Has had Hep B vaccination already.  HTN -tolerating current doses.  No new side effects.Episodic HA, but improved recently  Soda at 11 am, no other food today.    Review of Systems  See 13 point ROS on PHS - scanned copy for other items discussed.       Objective:   Physical Exam  Constitutional: He is oriented to person, place, and time. He appears well-developed and well-nourished.       overweight  HENT:  Head: Normocephalic and atraumatic.  Right Ear: External ear normal.  Left Ear: External ear normal.  Eyes: Conjunctivae and EOM are normal. Pupils are equal, round, and reactive to light.  Neck: Neck supple.  Cardiovascular: Normal rate, regular rhythm, normal heart sounds and intact distal pulses.   No murmur heard. Pulmonary/Chest: Effort normal and breath sounds normal.  Abdominal: He exhibits no distension. There is no tenderness. There is no rebound.  Musculoskeletal: Normal range of motion. He exhibits no edema and no tenderness.  Lymphadenopathy:    He has no cervical adenopathy.  Neurological: He is alert and oriented to person, place, and time.  Skin: Skin is warm and dry.  Psychiatric: He has a normal mood and affect. His behavior is normal.       Assessment & Plan:  Craig Guzman is a 41 y.o. male CPE - check CMP, lipids CBC, PSA (nocturia and frequency, but longstanding, und suspect component due to nighttime water intake, prior PSA WNL).  Tdap, Pneumovax given.  Newly diagnosed with Crohn's dz - has follow up with [name].  Hep A vaccine given, and repeat in 6 months.  Pneumovax given.  HTN- controlled at home.  No new med changes.   Recheck 1 month.

## 2011-09-06 ENCOUNTER — Other Ambulatory Visit (INDEPENDENT_AMBULATORY_CARE_PROVIDER_SITE_OTHER): Payer: 59

## 2011-09-06 DIAGNOSIS — K7460 Unspecified cirrhosis of liver: Secondary | ICD-10-CM

## 2011-09-06 LAB — CBC WITH DIFFERENTIAL/PLATELET
Basophils Absolute: 0 10*3/uL (ref 0.0–0.1)
HCT: 43.1 % (ref 39.0–52.0)
Hemoglobin: 14.3 g/dL (ref 13.0–17.0)
Lymphs Abs: 1.7 10*3/uL (ref 0.7–4.0)
MCV: 85.8 fl (ref 78.0–100.0)
Monocytes Absolute: 0.8 10*3/uL (ref 0.1–1.0)
Neutro Abs: 1.9 10*3/uL (ref 1.4–7.7)
Platelets: 181 10*3/uL (ref 150.0–400.0)
RDW: 14.2 % (ref 11.5–14.6)

## 2011-09-07 NOTE — Progress Notes (Signed)
Quick Note:  He needed LFT's also and will need to set up April REV ______

## 2011-09-07 NOTE — Progress Notes (Signed)
Quick Note:  He started 6 MP and needs LFT's by next week ______

## 2011-09-08 ENCOUNTER — Other Ambulatory Visit: Payer: Self-pay

## 2011-09-08 DIAGNOSIS — K5090 Crohn's disease, unspecified, without complications: Secondary | ICD-10-CM

## 2011-09-19 ENCOUNTER — Ambulatory Visit (INDEPENDENT_AMBULATORY_CARE_PROVIDER_SITE_OTHER): Payer: 59 | Admitting: Family Medicine

## 2011-09-19 VITALS — BP 142/94 | HR 75 | Temp 98.8°F | Resp 18 | Ht 72.0 in | Wt 312.6 lb

## 2011-09-19 DIAGNOSIS — R319 Hematuria, unspecified: Secondary | ICD-10-CM

## 2011-09-19 DIAGNOSIS — M549 Dorsalgia, unspecified: Secondary | ICD-10-CM

## 2011-09-19 DIAGNOSIS — R350 Frequency of micturition: Secondary | ICD-10-CM

## 2011-09-19 LAB — POCT CBC
Lymph, poc: 2.3 (ref 0.6–3.4)
MCHC: 33.4 g/dL (ref 31.8–35.4)
MPV: 9.6 fL (ref 0–99.8)
POC Granulocyte: 2.1 (ref 2–6.9)
POC LYMPH PERCENT: 46.8 %L (ref 10–50)
POC MID %: 10.7 %M (ref 0–12)
Platelet Count, POC: 244 10*3/uL (ref 142–424)
RDW, POC: 14.6 %

## 2011-09-19 LAB — POCT URINALYSIS DIPSTICK
Bilirubin, UA: NEGATIVE
Ketones, UA: NEGATIVE
Leukocytes, UA: NEGATIVE
Spec Grav, UA: >=1.03
pH, UA: 5.5

## 2011-09-19 LAB — POCT UA - MICROSCOPIC ONLY
Bacteria, U Microscopic: NEGATIVE
Mucus, UA: POSITIVE
WBC, Ur, HPF, POC: NEGATIVE

## 2011-09-19 MED ORDER — CIPROFLOXACIN HCL 500 MG PO TABS: 500.0000 mg | ORAL_TABLET | Freq: Two times a day (BID) | ORAL | Status: AC

## 2011-09-19 MED ORDER — HYDROCODONE-ACETAMINOPHEN 5-325 MG PO TABS: 1.0000 | ORAL_TABLET | Freq: Four times a day (QID) | ORAL | Status: AC | PRN

## 2011-09-19 NOTE — Progress Notes (Signed)
Subjective:    Patient ID: Craig Guzman, male    DOB: 06-Jan-1971, 41 y.o.   MRN: [id number]  HPI Craig Guzman is a 41 y.o. male Low back pain "from one side to other". Started yesterday, NKI, stomach spasms, but no diarrhea or constipation, no N/V/fever.  Urinating more frequently.  No dysuria.  feels like pain when getting sick.   Headache starting - hx of mirgraine - usually takes flexeril - has some at home available.  No fever, no sick contacts.  No change in activity or known injury.  Last uti few years ago.  No known hx prostatitis.  Review of Systems  Constitutional: Negative for fever and chills.  Respiratory: Negative for cough.   Gastrointestinal: Negative for nausea, vomiting and abdominal pain.  Genitourinary: Positive for urgency, frequency and flank pain. Negative for dysuria, hematuria and difficulty urinating.       Objective:   Physical Exam  Constitutional: He is oriented to person, place, and time. He appears well-developed and well-nourished.  HENT:  Head: Normocephalic and atraumatic.       Appears uncomfortable with headache, but no other distress.  Cardiovascular: Normal rate, regular rhythm, normal heart sounds and intact distal pulses.   Pulmonary/Chest: Effort normal and breath sounds normal.  Abdominal: Soft. There is no tenderness.  Musculoskeletal:       Lumbar back: He exhibits tenderness. He exhibits normal range of motion, no bony tenderness and no edema.       Back:       No apparent cvat.  Lower lumbar ttp. Able to heel and toe walk without difficulty.  Pain in low back when extending from flexed position.  Neurological: He is alert and oriented to person, place, and time.  Skin: Skin is warm and dry. No rash noted.  Psychiatric: He has a normal mood and affect. His behavior is normal.   Results for orders placed in visit on 09/19/11  POCT URINALYSIS DIPSTICK      Component Value Range   Color, UA orange     Clarity, UA cloudy     Glucose,  UA neg     Bilirubin, UA neg     Ketones, UA neg     Spec Grav, UA >=1.030     Blood, UA trace-intact     pH, UA 5.5     Protein, UA neg     Urobilinogen, UA 0.2     Nitrite, UA neg     Leukocytes, UA Negative    POCT UA - MICROSCOPIC ONLY      Component Value Range   WBC, Ur, HPF, POC neg     RBC, urine, microscopic 0-1     Bacteria, U Microscopic neg     Mucus, UA positive     Epithelial cells, urine per micros neg     Crystals, Ur, HPF, POC neg     Casts, Ur, LPF, POC neg     Yeast, UA neg     Amorphous positive    POCT CBC      Component Value Range   WBC 5.0  4.6 - 10.2 (K/uL)   Lymph, poc 2.3  0.6 - 3.4    POC LYMPH PERCENT 46.8  10 - 50 (%L)   MID (cbc) 0.5  0 - 0.9    POC MID % 10.7  0 - 12 (%M)   POC Granulocyte 2.1  2 - 6.9    Granulocyte percent 42.5  37 - 80 (%G)  RBC 5.28  4.69 - 6.13 (M/uL)   Hemoglobin 15.0  14.1 - 18.1 (g/dL)   HCT, POC 44.9  43.5 - 53.7 (%)   MCV 85.0  80 - 97 (fL)   MCH, POC 28.4  27 - 31.2 (pg)   MCHC 33.4  31.8 - 35.4 (g/dL)   RDW, POC 14.6     Platelet Count, POC 244  142 - 424 (K/uL)   MPV 9.6  0 - 99.8 (fL)          Assessment & Plan:  Craig Guzman is a 41 y.o. male  1. Back pain  POCT urinalysis dipstick, POCT UA - Microscopic Only, POCT CBC, PSA  2. Urinary frequency  POCT urinalysis dipstick, POCT UA - Microscopic Only, POCT CBC, PSA    LBP - bilaterral, acute onset with frequency/urgency and hematuria.  DDX prostatitis vs nephrolithiasis (had kidney stone about 10 years ago.)  Start cipro 526m BID, check urine culture, push fluids, filter for urine, Lortab q6hprn, and recheck in 48 hours if not improving

## 2011-09-20 LAB — PSA: PSA: 0.2 ng/mL (ref <=4.00)

## 2011-10-06 ENCOUNTER — Ambulatory Visit (INDEPENDENT_AMBULATORY_CARE_PROVIDER_SITE_OTHER): Payer: 59 | Admitting: Family Medicine

## 2011-10-06 ENCOUNTER — Encounter: Payer: Self-pay | Admitting: Family Medicine

## 2011-10-06 VITALS — BP 135/89 | HR 90 | Temp 97.5°F | Resp 16 | Ht 71.5 in | Wt 316.0 lb

## 2011-10-06 DIAGNOSIS — I10 Essential (primary) hypertension: Secondary | ICD-10-CM

## 2011-10-06 DIAGNOSIS — E785 Hyperlipidemia, unspecified: Secondary | ICD-10-CM

## 2011-10-06 DIAGNOSIS — R319 Hematuria, unspecified: Secondary | ICD-10-CM

## 2011-10-06 DIAGNOSIS — E782 Mixed hyperlipidemia: Secondary | ICD-10-CM

## 2011-10-06 DIAGNOSIS — E876 Hypokalemia: Secondary | ICD-10-CM

## 2011-10-06 LAB — BASIC METABOLIC PANEL
BUN: 16 mg/dL (ref 6–23)
Calcium: 9.1 mg/dL (ref 8.4–10.5)
Creat: 1.32 mg/dL (ref 0.50–1.35)
Glucose, Bld: 83 mg/dL (ref 70–99)
Potassium: 3.8 mEq/L (ref 3.5–5.3)

## 2011-10-06 NOTE — Progress Notes (Signed)
  Subjective:    Patient ID: Craig Guzman, male    DOB: 02-19-1971, 41 y.o.   MRN: [id number]  HPI  S/p CPE 09/01/11, and OV 09/19/11 for LBP, hematuria - suspected nephrolith.  See ov for details. Urine culture and PSA were normal.  Passed few stones, then few days later felt better. No further pain or blood in urine.  Hypokalemia - borderline on last labs.  Takes hctz 12.66m in zestoretic.   Borderline in past as well.   Hyperlipidemia - Had gained weight on prednisone for crohn's.  Now losing weight.  Taking fish oil.  LDL increased to 108. Lipids reviewed with patient from last office visit.   Review of Systems  Constitutional: Negative for fatigue and unexpected weight change.  Eyes: Negative for visual disturbance.  Respiratory: Negative for cough, chest tightness and shortness of breath.   Cardiovascular: Negative for chest pain, palpitations and leg swelling.  Neurological: Negative for dizziness, light-headedness and headaches.       Objective:   Physical Exam  Constitutional: He appears well-developed and well-nourished.  HENT:  Head: Normocephalic and atraumatic.  Eyes: Conjunctivae are normal. Pupils are equal, round, and reactive to light.  Neck: Normal range of motion.  Cardiovascular: Normal rate, regular rhythm, normal heart sounds and intact distal pulses.   Pulmonary/Chest: Effort normal and breath sounds normal.  Abdominal: There is no CVA tenderness.  Musculoskeletal: He exhibits no edema.  Skin: Skin is warm and dry.  Psychiatric: He has a normal mood and affect. His behavior is normal.         Assessment & Plan:  [name] a 41y.o. male  1. Hypokalemia  Basic metabolic panel  2. HTN (hypertension)  Basic metabolic panel  3. Hematuria    4. Hyperlipidemia      HTN, with borderline hypokalemia recently:  Recheck BMP.  eat banana each day since on HCTZ.  `  Hyperlipidemia - borderline. Should improve off prednisone with weight loss.  Recheck  levels in 3 - 4 months.  Hematuria - likely nephrolithiasis - now asx. Consider repeat u/a next ov.  If recurs, bring in stone for analysis. Followup office visit in 3-4 months

## 2011-10-09 ENCOUNTER — Ambulatory Visit (INDEPENDENT_AMBULATORY_CARE_PROVIDER_SITE_OTHER): Payer: 59 | Admitting: Internal Medicine

## 2011-10-09 ENCOUNTER — Other Ambulatory Visit (INDEPENDENT_AMBULATORY_CARE_PROVIDER_SITE_OTHER): Payer: 59

## 2011-10-09 ENCOUNTER — Encounter: Payer: Self-pay | Admitting: Internal Medicine

## 2011-10-09 VITALS — BP 132/90 | HR 84 | Ht 74.0 in | Wt 321.0 lb

## 2011-10-09 DIAGNOSIS — Z79899 Other long term (current) drug therapy: Secondary | ICD-10-CM

## 2011-10-09 DIAGNOSIS — K5090 Crohn's disease, unspecified, without complications: Secondary | ICD-10-CM

## 2011-10-09 DIAGNOSIS — K7460 Unspecified cirrhosis of liver: Secondary | ICD-10-CM

## 2011-10-09 DIAGNOSIS — K219 Gastro-esophageal reflux disease without esophagitis: Secondary | ICD-10-CM

## 2011-10-09 LAB — CBC WITH DIFFERENTIAL/PLATELET
Basophils Absolute: 0 10*3/uL (ref 0.0–0.1)
Basophils Relative: 0.6 % (ref 0.0–3.0)
Eosinophils Absolute: 0.2 10*3/uL (ref 0.0–0.7)
HCT: 46.4 % (ref 39.0–52.0)
Hemoglobin: 15.4 g/dL (ref 13.0–17.0)
Lymphs Abs: 1.9 10*3/uL (ref 0.7–4.0)
MCHC: 33.1 g/dL (ref 30.0–36.0)
Neutro Abs: 2.2 10*3/uL (ref 1.4–7.7)
RBC: 5.38 Mil/uL (ref 4.22–5.81)
RDW: 14.7 % — ABNORMAL HIGH (ref 11.5–14.6)

## 2011-10-09 LAB — HEPATIC FUNCTION PANEL
AST: 23 U/L (ref 0–37)
Albumin: 3.9 g/dL (ref 3.5–5.2)
Total Protein: 7.5 g/dL (ref 6.0–8.3)

## 2011-10-09 MED ORDER — MERCAPTOPURINE 50 MG PO TABS: ORAL_TABLET | ORAL | Status: DC

## 2011-10-09 MED ORDER — HYOSCYAMINE SULFATE 0.125 MG SL SUBL: 0.1250 mg | SUBLINGUAL_TABLET | SUBLINGUAL | Status: DC | PRN

## 2011-10-09 NOTE — Patient Instructions (Addendum)
You have been given a separate informational sheet regarding your tobacco use, the importance of quitting and local resources to help you quit. Your physician has requested that you go to the basement for the following lab work before leaving today: CBC, Hepatic Function  We have sent the following medications to your pharmacy for you to pick up at your convenience: Levsin SL and 6MP  Follow-up in 4 months.

## 2011-10-09 NOTE — Assessment & Plan Note (Signed)
Improved - stay on dexilant

## 2011-10-09 NOTE — Progress Notes (Signed)
  Subjective:    Patient ID: Craig Guzman, male    DOB: 1971-06-17, 41 y.o.   MRN: [id number]  HPI The patient returns for followup of his Crohn's disease, which involves the stomach ileum and colon. He started 6-mercaptopurine about 2 months ago. He has improved. He still gets severe cramps in the upper abdomen the lesser one to 2 minutes and make it difficult to breathe. This occurs once or twice a week. He is not having significant nausea and vomiting. He is off prednisone. He there have procedure started recommended vaccinations through his primary care physician.   Review of Systems Has not lost much of the weight gain on the prednisone    Objective:   Physical Exam General:  NAD obese Eyes:   anicteric Lungs:  clear Heart:  S1S2 no rubs, murmurs or gallops Abdomen:  soft and nontender, BS+     Data Reviewed:  Lab Results  Component Value Date   WBC 5.0 09/19/2011   HGB 15.0 09/19/2011   HCT 44.9 09/19/2011   MCV 85.0 09/19/2011   PLT 181.0 09/06/2011     Chemistry      Component Value Date/Time   NA 141 10/06/2011 1714   K 3.8 10/06/2011 1714   CL 108 10/06/2011 1714   CO2 22 10/06/2011 1714   BUN 16 10/06/2011 1714   CREATININE 1.32 10/06/2011 1714   CREATININE 1.07 01/10/2010 1040      Component Value Date/Time   CALCIUM 9.1 10/06/2011 1714   ALKPHOS 82 09/01/2011 1544   AST 20 09/01/2011 1544   ALT 23 09/01/2011 1544   BILITOT 0.5 09/01/2011 1544               Assessment & Plan:   1. Crohn's disease of stomach and colon   2. Long-term use of immunosuppressant medication   3. GERD (gastroesophageal reflux disease)       He is improved overall. Please see the problem oriented charting below and in the encounter section.  Cirrhosis diagnosis was inadvertently associated with his 6 MP prescription and I could not remove that. That is a mistake.

## 2011-10-09 NOTE — Assessment & Plan Note (Signed)
Vaccinations given/started Lab follow-up today

## 2011-10-09 NOTE — Progress Notes (Signed)
Quick Note:  These labs are ok - let him know He will need a CBC and hepatic function panel again in 3 months ______

## 2011-10-09 NOTE — Assessment & Plan Note (Addendum)
improved on 6-MP 125 mg daily. Fortunately he is off prednisone. I explained it will take some time to see the full effects of medication. I anticipate seeing him in 4 months, at some point we'll need to get 6-TG and 6 MM NP levels.

## 2011-10-10 ENCOUNTER — Other Ambulatory Visit: Payer: Self-pay

## 2011-10-10 DIAGNOSIS — K5090 Crohn's disease, unspecified, without complications: Secondary | ICD-10-CM

## 2011-11-11 ENCOUNTER — Other Ambulatory Visit: Payer: Self-pay | Admitting: Family Medicine

## 2011-11-11 ENCOUNTER — Other Ambulatory Visit: Payer: Self-pay | Admitting: Physician Assistant

## 2011-11-13 ENCOUNTER — Ambulatory Visit (INDEPENDENT_AMBULATORY_CARE_PROVIDER_SITE_OTHER): Payer: 59 | Admitting: Family Medicine

## 2011-11-13 VITALS — BP 130/87 | HR 70 | Temp 98.4°F | Resp 18 | Ht 71.5 in | Wt 321.8 lb

## 2011-11-13 DIAGNOSIS — Z209 Contact with and (suspected) exposure to unspecified communicable disease: Secondary | ICD-10-CM

## 2011-11-13 DIAGNOSIS — R5381 Other malaise: Secondary | ICD-10-CM

## 2011-11-13 DIAGNOSIS — I10 Essential (primary) hypertension: Secondary | ICD-10-CM

## 2011-11-13 DIAGNOSIS — IMO0001 Reserved for inherently not codable concepts without codable children: Secondary | ICD-10-CM

## 2011-11-13 DIAGNOSIS — R5383 Other fatigue: Secondary | ICD-10-CM

## 2011-11-13 DIAGNOSIS — Z79899 Other long term (current) drug therapy: Secondary | ICD-10-CM

## 2011-11-13 DIAGNOSIS — R42 Dizziness and giddiness: Secondary | ICD-10-CM

## 2011-11-13 DIAGNOSIS — M791 Myalgia, unspecified site: Secondary | ICD-10-CM

## 2011-11-13 LAB — POCT CBC
Granulocyte percent: 41 %G (ref 37–80)
Hemoglobin: 15.2 g/dL (ref 14.1–18.1)
MCH, POC: 28.9 pg (ref 27–31.2)
MID (cbc): 0.5 (ref 0–0.9)
MPV: 9.7 fL (ref 0–99.8)
POC MID %: 13.2 %M — AB (ref 0–12)
Platelet Count, POC: 234 10*3/uL (ref 142–424)
RBC: 5.28 M/uL (ref 4.69–6.13)
WBC: 4.1 10*3/uL — AB (ref 4.6–10.2)

## 2011-11-13 LAB — COMPREHENSIVE METABOLIC PANEL
ALT: 21 U/L (ref 0–53)
AST: 24 U/L (ref 0–37)
CO2: 27 mEq/L (ref 19–32)
Chloride: 106 mEq/L (ref 96–112)
Creat: 1.02 mg/dL (ref 0.50–1.35)
Sodium: 140 mEq/L (ref 135–145)
Total Bilirubin: 0.5 mg/dL (ref 0.3–1.2)
Total Protein: 7.3 g/dL (ref 6.0–8.3)

## 2011-11-13 LAB — CK: Total CK: 160 U/L (ref 7–232)

## 2011-11-13 NOTE — Progress Notes (Signed)
Subjective:    Patient ID: Craig Guzman, male    DOB: 1970-07-05, 41 y.o.   MRN: [id number]  HPI Craig Guzman is a 41 y.o. male Hx of Crohn's DZ - on mercaptopurine, HTN, GERD.  Started 2 days ago - diffuse body aches.  Skin felt tight. No fever.  No sore throat, cough, abdominal pain or other new symptoms.  C/o back and neck pain today. Dizzy this am.  Off balance yesterday. No pain in ears.  Decreased food recently.  Foods taste funny, but able to drink water.  Too much pain to dress self today.  Similar sx's in past when has the flu. No recent change in activity or new workouts.  No known sick contacts dtr with runny nose 1 week ago.  Some worsening of reflux.  - has appt this week with GI.  Review of Systems  Constitutional: Negative for fever and chills.  HENT: Negative for hearing loss, ear pain, congestion, rhinorrhea, tinnitus and ear discharge.   Respiratory: Negative for cough, chest tightness and shortness of breath.   Cardiovascular: Negative for chest pain and palpitations.  Musculoskeletal: Positive for myalgias and arthralgias.  Neurological: Positive for dizziness, light-headedness and headaches. Negative for speech difficulty and weakness.       Objective:   Physical Exam  Constitutional: He is oriented to person, place, and time. He appears well-developed and well-nourished. No distress.  HENT:  Head: Normocephalic and atraumatic.  Right Ear: External ear normal. No middle ear effusion.  Left Ear: External ear normal.  No middle ear effusion.  Mouth/Throat: Oropharynx is clear and moist. No oropharyngeal exudate.  Eyes: Conjunctivae and EOM are normal. Pupils are equal, round, and reactive to light. Right eye exhibits no nystagmus. Left eye exhibits no nystagmus.  Cardiovascular: Normal rate, regular rhythm, normal heart sounds and intact distal pulses.  Exam reveals no gallop.   No murmur heard. Pulmonary/Chest: Effort normal and breath sounds normal.  Abdominal:  Soft.  Musculoskeletal:       Guarded with standing - slow to start, but no focal weakness.   Neurological: He is alert and oriented to person, place, and time. He has normal strength. He is not disoriented. No sensory deficit. He displays a negative Romberg sign.       No pronator drift, normal heel to toe. nonfocal.  Skin: Skin is warm and dry. He is not diaphoretic.  Psychiatric: He has a normal mood and affect. His behavior is normal.   Results for orders placed in visit on 11/13/11  POCT CBC      Component Value Range   WBC 4.1 (*) 4.6 - 10.2 (K/uL)   Lymph, poc 1.9  0.6 - 3.4    POC LYMPH PERCENT 45.8  10 - 50 (%L)   MID (cbc) 0.5  0 - 0.9    POC MID % 13.2 (*) 0 - 12 (%M)   POC Granulocyte 1.7 (*) 2 - 6.9    Granulocyte percent 41.0  37 - 80 (%G)   RBC 5.28  4.69 - 6.13 (M/uL)   Hemoglobin 15.2  14.1 - 18.1 (g/dL)   HCT, POC 45.4  43.5 - 53.7 (%)   MCV 85.9  80 - 97 (fL)   MCH, POC 28.9  27 - 31.2 (pg)   MCHC 33.5  31.8 - 35.4 (g/dL)   RDW, POC 14.6     Platelet Count, POC 234  142 - 424 (K/uL)   MPV 9.7  0 - 99.8 (fL)  Assessment & Plan:  Craig Guzman is a 41 y.o. male 1. Myalgia   2. Malaise and fatigue   3. HTN (hypertension)   4. High risk medication use   5. Dizziness    nonfocal neuro exam.  Possible early viral illness vs med reaction, vs lytes.  Check CMP, CK with high risk med use.  Follow up in next 48-72 hours

## 2011-11-21 ENCOUNTER — Telehealth: Payer: Self-pay | Admitting: Internal Medicine

## 2011-11-21 MED ORDER — METOCLOPRAMIDE HCL 5 MG/5ML PO SOLN: 10.0000 mg | Freq: Every day | ORAL | Status: DC

## 2011-11-21 NOTE — Telephone Encounter (Signed)
Left message for patient to call back  

## 2011-11-21 NOTE — Telephone Encounter (Signed)
Patient is having continued issues with reflux.  He reports being woke up at night with reflux.  He is taking his meds prior to a meal, but not helping.  I have asked him to elevate the head, he is not eating within several hours of going to bed.  Dr Carlean Purl do you have any additional recommendations?

## 2011-11-21 NOTE — Telephone Encounter (Signed)
metaclopramide 10 mg before bed #30 1 refill  Rev me in June (2-4 weeks)  This should help within a few days - if it doesn't call back

## 2011-11-21 NOTE — Telephone Encounter (Signed)
Patient advised.  REV scheduled for 12/08/11 8:30.  He is advised to call back if his symptoms don't improve

## 2011-12-08 ENCOUNTER — Other Ambulatory Visit (INDEPENDENT_AMBULATORY_CARE_PROVIDER_SITE_OTHER): Payer: 59

## 2011-12-08 ENCOUNTER — Encounter: Payer: Self-pay | Admitting: Internal Medicine

## 2011-12-08 ENCOUNTER — Ambulatory Visit (INDEPENDENT_AMBULATORY_CARE_PROVIDER_SITE_OTHER): Payer: 59 | Admitting: Internal Medicine

## 2011-12-08 VITALS — BP 124/64 | HR 88 | Ht 74.0 in | Wt 326.6 lb

## 2011-12-08 DIAGNOSIS — G43909 Migraine, unspecified, not intractable, without status migrainosus: Secondary | ICD-10-CM

## 2011-12-08 DIAGNOSIS — Z79899 Other long term (current) drug therapy: Secondary | ICD-10-CM

## 2011-12-08 DIAGNOSIS — K5090 Crohn's disease, unspecified, without complications: Secondary | ICD-10-CM

## 2011-12-08 DIAGNOSIS — K3184 Gastroparesis: Secondary | ICD-10-CM | POA: Insufficient documentation

## 2011-12-08 DIAGNOSIS — G43009 Migraine without aura, not intractable, without status migrainosus: Secondary | ICD-10-CM | POA: Insufficient documentation

## 2011-12-08 LAB — C-REACTIVE PROTEIN: CRP: 1 mg/dL (ref 1–20)

## 2011-12-08 MED ORDER — ONDANSETRON 4 MG PO TBDP: 4.0000 mg | ORAL_TABLET | Freq: Three times a day (TID) | ORAL | Status: DC | PRN

## 2011-12-08 MED ORDER — DIPHENOXYLATE-ATROPINE 2.5-0.025 MG PO TABS: 1.0000 | ORAL_TABLET | Freq: Four times a day (QID) | ORAL | Status: DC | PRN

## 2011-12-08 NOTE — Assessment & Plan Note (Addendum)
?   Toxicity Amylase and lipase are okay so is not suffering pancreatitis from this drug.

## 2011-12-08 NOTE — Assessment & Plan Note (Addendum)
This certainly could be causing some of his GI symptoms, specifically nausea and vomiting and maybe even abdominal pain. Difficult to know for sure in  overall situation though his headache went away when he tooksumitriptan.

## 2011-12-08 NOTE — Assessment & Plan Note (Addendum)
Worse or persistently symptomatic on an intermittent basis at least. He has been on 6 mercaptopurine for about 4 months there should be reaching benefit. I will check thiopurine metabolite levels. Need to consider biologic therapy and have discussed this with him.  His C-reactive protein is only one which goes against active inflammation. I may need to repeat an upper endoscopy plus minus colonoscopy though at least an upper endoscopy. I would probably do that prior to starting biologic therapy. I have prescribed diphenoxylate and atropine for his diarrhea and cramps.  Ondansetron hs refilled  6TG level below therapeutic at 153 and 6MMPN level below toxic at 1189 - increase 6MP to 150/day

## 2011-12-08 NOTE — Patient Instructions (Addendum)
Your physician has requested that you go to the basement for the following lab work before leaving today: Amylase, Lipase, CRP, [facility] Thiopurine Metabolites  We have sent the following medications to your pharmacy for you to pick up at your convenience: Zofran, and written RX given today for Lomotil  You have been given information today on Crohn's and Colitis.  You have also been given a work note.

## 2011-12-08 NOTE — Progress Notes (Addendum)
  Subjective:    Patient ID: Craig Guzman, male    DOB: 05-20-1971, 41 y.o.   MRN: [id number]  HPI The patient returns for followup of his gastric and ileocolonic Crohn's disease. He is still having problems with nausea and vomiting. He seems to have good and bad weeks. Last week was a good week but this which has been bed with intermittent abdominal cramps was doubled him over, diarrhea and nausea and vomiting. He also developed a migraine headache a couple of days ago which seems to be better after using the last of his sumitriptan.  He is planning to go to [place] to visit his mother at the end of the month is concerned he will not be able to eat home cooking.  .Medications, allergies, past medical history, past surgical history, family history and social history are reviewed and updated in the EMR.   Review of Systems No fevers or chills. He does feel weak and more not a lot. He saw his primary care physician in May and was thought to possibly have a viral syndrome.    Objective:   Physical Exam General:  NAD, obese Eyes:   anicteric Lungs:  clear Heart:  S1S2 no rubs, murmurs or gallops Abdomen:  soft and nontender, BS+, obese Ext:   no edema         Assessment & Plan:   1. Crohn's disease of stomach and colon   2. Long-term use of immunosuppressant medication   3. Gastroparesis   4. Migraine headache    6TGN levels are below therapeutic at 153 and 6 MMPN levels are below toxicity range at 1189 Will increase dose to 150 mg /day

## 2011-12-08 NOTE — Assessment & Plan Note (Signed)
This was documented on gastric emptying study. I thought it was probably due to the inflammatory process in his stomach. He is on metoclopramide but he still has intermittent symptoms.

## 2011-12-12 NOTE — Progress Notes (Signed)
Quick Note:  Let him know that labs I did are ok - two were to check pancreas and its ok (can be inflamed by 6 MP) and the other is a marker of inflammation that is ok - suggesting no inflammation though not a foolproof test.  I am waiting on thiopurine metabolite levels from [facility] and we will call those results and plans.  He should hear from [place] by June 21 - if he does not ask him to call us ______

## 2011-12-20 ENCOUNTER — Telehealth: Payer: Self-pay | Admitting: Internal Medicine

## 2011-12-20 DIAGNOSIS — K7460 Unspecified cirrhosis of liver: Secondary | ICD-10-CM

## 2011-12-20 DIAGNOSIS — K5090 Crohn's disease, unspecified, without complications: Secondary | ICD-10-CM

## 2011-12-20 MED ORDER — MERCAPTOPURINE 50 MG PO TABS: 150.0000 mg | ORAL_TABLET | Freq: Every day | ORAL | Status: DC

## 2011-12-20 NOTE — Telephone Encounter (Signed)
Need to increase 6 MP to 150 mg/day as levels were low  How is he doing? I sent new Rx for 30 day supply and 1 refill to [facility]  He needs CBC and LFT's in 2 weeks  Will arrange REV after I see labs - may need an EGD - am weighing options

## 2011-12-20 NOTE — Telephone Encounter (Signed)
Left message for patient to call back  

## 2011-12-20 NOTE — Telephone Encounter (Signed)
[name] please clarify the Rx for me.  141m per day or 125 mg per day ?  Thank you Sir.

## 2011-12-21 MED ORDER — MERCAPTOPURINE 50 MG PO TABS: ORAL_TABLET | ORAL | Status: DC

## 2011-12-21 NOTE — Telephone Encounter (Signed)
Increasing to 150 mg daily - 6 mercaptopurine

## 2011-12-21 NOTE — Telephone Encounter (Signed)
Pharmacy informed of the correct dose , 150 mg per day.

## 2011-12-21 NOTE — Telephone Encounter (Signed)
Patient advised.  I have sent a new rx to the pharmacy with the correct instructions.  He is out of town in 2 weeks.  He will come for labs on 01/10/12.  He states he is doing fine, but does report some occasional vomiting about 2 x a week

## 2011-12-27 ENCOUNTER — Encounter: Payer: Self-pay | Admitting: Family Medicine

## 2012-01-09 ENCOUNTER — Other Ambulatory Visit (INDEPENDENT_AMBULATORY_CARE_PROVIDER_SITE_OTHER): Payer: 59

## 2012-01-09 ENCOUNTER — Other Ambulatory Visit: Payer: Self-pay

## 2012-01-09 DIAGNOSIS — Z79899 Other long term (current) drug therapy: Secondary | ICD-10-CM

## 2012-01-09 DIAGNOSIS — K5090 Crohn's disease, unspecified, without complications: Secondary | ICD-10-CM

## 2012-01-09 LAB — CBC WITH DIFFERENTIAL/PLATELET
Basophils Absolute: 0 10*3/uL (ref 0.0–0.1)
Basophils Relative: 0.6 % (ref 0.0–3.0)
Eosinophils Absolute: 0.2 10*3/uL (ref 0.0–0.7)
Lymphocytes Relative: 32.3 % (ref 12.0–46.0)
MCHC: 33.6 g/dL (ref 30.0–36.0)
MCV: 85.9 fl (ref 78.0–100.0)
Monocytes Absolute: 0.5 10*3/uL (ref 0.1–1.0)
Neutrophils Relative %: 53 % (ref 43.0–77.0)
Platelets: 221 10*3/uL (ref 150.0–400.0)
RDW: 14.4 % (ref 11.5–14.6)

## 2012-01-09 LAB — HEPATIC FUNCTION PANEL
Bilirubin, Direct: 0 mg/dL (ref 0.0–0.3)
Total Bilirubin: 0.6 mg/dL (ref 0.3–1.2)
Total Protein: 7.6 g/dL (ref 6.0–8.3)

## 2012-01-09 NOTE — Progress Notes (Signed)
Quick Note:  Let him know labs look fine  Repeat CBC and LFT's in 1 month re: log-term use immunosuppressants  Please get a symptom update ______

## 2012-01-18 ENCOUNTER — Encounter (HOSPITAL_COMMUNITY): Payer: Self-pay | Admitting: *Deleted

## 2012-01-18 ENCOUNTER — Emergency Department (HOSPITAL_COMMUNITY): Payer: 59

## 2012-01-18 DIAGNOSIS — I10 Essential (primary) hypertension: Secondary | ICD-10-CM | POA: Insufficient documentation

## 2012-01-18 DIAGNOSIS — K219 Gastro-esophageal reflux disease without esophagitis: Secondary | ICD-10-CM | POA: Insufficient documentation

## 2012-01-18 DIAGNOSIS — F411 Generalized anxiety disorder: Secondary | ICD-10-CM | POA: Insufficient documentation

## 2012-01-18 DIAGNOSIS — R51 Headache: Secondary | ICD-10-CM | POA: Insufficient documentation

## 2012-01-18 DIAGNOSIS — R079 Chest pain, unspecified: Secondary | ICD-10-CM | POA: Insufficient documentation

## 2012-01-18 DIAGNOSIS — F17200 Nicotine dependence, unspecified, uncomplicated: Secondary | ICD-10-CM | POA: Insufficient documentation

## 2012-01-18 DIAGNOSIS — K449 Diaphragmatic hernia without obstruction or gangrene: Secondary | ICD-10-CM | POA: Insufficient documentation

## 2012-01-18 LAB — POCT I-STAT, CHEM 8
BUN: 11 mg/dL (ref 6–23)
Calcium, Ion: 1.2 mmol/L (ref 1.12–1.23)
Creatinine, Ser: 1.1 mg/dL (ref 0.50–1.35)
TCO2: 20 mmol/L (ref 0–100)

## 2012-01-18 LAB — CBC
HCT: 44.2 % (ref 39.0–52.0)
Hemoglobin: 15.5 g/dL (ref 13.0–17.0)
MCH: 29.2 pg (ref 26.0–34.0)
MCV: 83.4 fL (ref 78.0–100.0)
RBC: 5.3 MIL/uL (ref 4.22–5.81)

## 2012-01-18 NOTE — ED Notes (Signed)
Pt to ED c/o chest pain.  Pt was laying on his bed and began experiencing intermittent anxiety attacks tonight as well (pt becomes tachycardic, diaphoretic), but this was different.  It felt like his chest was being squeezed.  Pt is being tx for headaches.  He took a flexeril with no relief.

## 2012-01-19 ENCOUNTER — Other Ambulatory Visit: Payer: Self-pay

## 2012-01-19 ENCOUNTER — Emergency Department (HOSPITAL_COMMUNITY)
Admission: EM | Admit: 2012-01-19 | Discharge: 2012-01-19 | Disposition: A | Discharge status: Home / Self Care | Payer: 59 | Attending: Emergency Medicine | Admitting: Emergency Medicine

## 2012-01-19 DIAGNOSIS — R51 Headache: Secondary | ICD-10-CM

## 2012-01-19 DIAGNOSIS — F410 Panic disorder [episodic paroxysmal anxiety] without agoraphobia: Secondary | ICD-10-CM

## 2012-01-19 MED ORDER — SODIUM CHLORIDE 0.9 % IV BOLUS (SEPSIS)
1000.0000 mL | Freq: Once | INTRAVENOUS | Status: AC
  Administered 2012-01-19: 1000 mL via INTRAVENOUS

## 2012-01-19 MED ORDER — LORAZEPAM 1 MG PO TABS
1.0000 mg | ORAL_TABLET | Freq: Once | ORAL | Status: AC
  Administered 2012-01-19: 1 mg via ORAL
  Filled 2012-01-19: qty 1

## 2012-01-19 MED ORDER — METOCLOPRAMIDE HCL 5 MG/ML IJ SOLN
10.0000 mg | Freq: Once | INTRAMUSCULAR | Status: AC
  Administered 2012-01-19: 10 mg via INTRAVENOUS
  Filled 2012-01-19: qty 2

## 2012-01-19 MED ORDER — DIPHENHYDRAMINE HCL 50 MG/ML IJ SOLN
25.0000 mg | Freq: Once | INTRAMUSCULAR | Status: AC
  Administered 2012-01-19: 25 mg via INTRAVENOUS
  Filled 2012-01-19: qty 1

## 2012-01-19 MED ORDER — DEXAMETHASONE SODIUM PHOSPHATE 4 MG/ML IJ SOLN
10.0000 mg | Freq: Once | INTRAMUSCULAR | Status: AC
  Administered 2012-01-19: 10 mg via INTRAVENOUS
  Filled 2012-01-19: qty 1

## 2012-01-19 NOTE — ED Provider Notes (Signed)
History     CSN: [id number]  Arrival date & time 01/18/12  2215   First MD Initiated Contact with Patient 01/19/12 0102      Chief Complaint  Patient presents with  . Chest Pain  . Anxiety  . Headache    (Consider location/radiation/quality/duration/timing/severity/associated sxs/prior treatment) HPI HX per PT, around 7pm started having "anxiety attack" with palpitations and heavy breathing, lasting on and off for 20-30 seconds, multiple episodes. C/o associated chest tightness. Takes lorazepam and one around 7pm, did not help. Now starting to get MHA. MHA located R sided and radiates to back of his head, has associated nausea and photophobia, gets these regualrily. All of his symptoms including his anxiety attack migraine headache are all typical for him. Moderate in severity.   Past Medical History  Diagnosis Date  . GERD (gastroesophageal reflux disease)   . HTN (hypertension)   . Gastric ulcer     antral  . Hiatal hernia   . Biliary dyskinesia   . Anxiety   . Obesity   . History of migraine headaches   . Allergy     trees/ grasses/ animals/dust/mold  . Crohn's disease     Stomach, terminal ileum, cecum  . Kidney stone   . Crohn's disease   . Ulcer     Past Surgical History  Procedure Date  . Cholecystectomy 2011    [facility]  . Vasectomy     bilateral w/lysis of penile adhesions  . Esophagogastroduodenoscopy 05/04/11, 07/26/11    granulomatous gastritis - Crohn's  . Multiple tooth extractions 2005  . Colonoscopy 07/26/11    Crohn's colitis, ileitis    Family History  Problem Relation Age of Onset  . Colon polyps Father   . Diabetes Father   . Colon cancer Neg Hx   . Hypertension Father   . Hypertension Mother     History  Substance Use Topics  . Smoking status: Current Everyday Smoker -- 0.5 packs/day for 10 years    Types: Cigarettes  . Smokeless tobacco: Former Systems developer   Comment: Counseling sheet given in exam room   . Alcohol Use: No       Review of Systems  Constitutional: Negative for fever and chills.  HENT: Negative for neck pain and neck stiffness.   Eyes: Negative for pain.  Respiratory: Negative for shortness of breath.   Cardiovascular: Positive for chest pain.  Gastrointestinal: Negative for abdominal pain.  Genitourinary: Negative for dysuria.  Musculoskeletal: Negative for back pain.  Skin: Negative for rash.  Neurological: Positive for headaches.  Psychiatric/Behavioral: The patient is nervous/anxious.   All other systems reviewed and are negative.    Allergies  Zithromax  Home Medications   Current Outpatient Rx  Name Route Sig Dispense Refill  . BACLOFEN 10 MG PO TABS Oral Take 10 mg by mouth 2 (two) times daily.    Marland Kitchen DEXILANT 60 MG PO CPDR  TAKE ONE CAPSULE BY MOUTH EVERY DAY RECHECK BEFORE OUT 90 capsule 0  . HYOSCYAMINE SULFATE 0.125 MG SL SUBL Sublingual Place 1 tablet (0.125 mg total) under the tongue every 4 (four) hours as needed for cramping. May take 2 tablets 60 tablet 2  . LISINOPRIL-HYDROCHLOROTHIAZIDE 20-12.5 MG PO TABS Oral Take 2 tablets by mouth daily. 180 tablet 1  . MERCAPTOPURINE 50 MG PO TABS Oral Take 150 mg by mouth daily. Give on an empty stomach 1 hour before or 2 hours after meals. Caution: Chemotherapy.    Marland Kitchen METOCLOPRAMIDE HCL 10 MG PO  TABS Oral Take 1 tablet (10 mg total) by mouth 3 (three) times daily before meals. 90 tablet 1  . TOPIRAMATE 25 MG PO TABS Oral Take 150 mg by mouth daily. 50 mg at bedtime    . VERAPAMIL HCL ER (CO) 180 MG PO TB24 Oral Take 1 tablet (180 mg total) by mouth 2 (two) times daily. 180 tablet 1    BP 141/87  Pulse 87  Temp 98.2 F (36.8 C) (Oral)  Resp 15  SpO2 100%  Physical Exam  Constitutional: He is oriented to person, place, and time. He appears well-developed and well-nourished.  HENT:  Head: Normocephalic and atraumatic.  Eyes: Conjunctivae and EOM are normal. Pupils are equal, round, and reactive to light.  Neck: Trachea  normal. Neck supple. No thyromegaly present.  Cardiovascular: Normal rate, regular rhythm, S1 normal, S2 normal and normal pulses.     No systolic murmur is present   No diastolic murmur is present  Pulses:      Radial pulses are 2+ on the right side, and 2+ on the left side.  Pulmonary/Chest: Effort normal and breath sounds normal. He has no wheezes. He has no rhonchi. He has no rales. He exhibits no tenderness.  Abdominal: Soft. Normal appearance and bowel sounds are normal. There is no tenderness. There is no CVA tenderness and negative Murphy's sign.  Musculoskeletal:       BLE:s Calves nontender, no cords or erythema, negative Homans sign  Neurological: He is alert and oriented to person, place, and time. He has normal strength. He displays normal reflexes. No cranial nerve deficit or sensory deficit. Coordination normal. GCS eye subscore is 4. GCS verbal subscore is 5. GCS motor subscore is 6.  Skin: Skin is warm and dry. No rash noted. He is not diaphoretic.  Psychiatric: His speech is normal.       Anxious, otherwise Cooperative and appropriate    ED Course  Procedures (including critical care time)   Results for orders placed during the hospital encounter of 01/19/12  CBC      Component Value Range   WBC 4.3  4.0 - 10.5 K/uL   RBC 5.30  4.22 - 5.81 MIL/uL   Hemoglobin 15.5  13.0 - 17.0 g/dL   HCT 44.2  39.0 - 52.0 %   MCV 83.4  78.0 - 100.0 fL   MCH 29.2  26.0 - 34.0 pg   MCHC 35.1  30.0 - 36.0 g/dL   RDW 14.8  11.5 - 15.5 %   Platelets 239  150 - 400 K/uL  POCT I-STAT, CHEM 8      Component Value Range   Sodium 144  135 - 145 mEq/L   Potassium 3.5  3.5 - 5.1 mEq/L   Chloride 110  96 - 112 mEq/L   BUN 11  6 - 23 mg/dL   Creatinine, Ser 1.10  0.50 - 1.35 mg/dL   Glucose, Bld 100 (*) 70 - 99 mg/dL   Calcium, Ion 1.20  1.12 - 1.23 mmol/L   TCO2 20  0 - 100 mmol/L   Hemoglobin 15.6  13.0 - 17.0 g/dL   HCT 46.0  39.0 - 52.0 %  POCT I-STAT TROPONIN I      Component  Value Range   Troponin i, poc 0.02  0.00 - 0.08 ng/mL   Comment 3           POCT I-STAT TROPONIN I      Component Value Range   Troponin  i, poc 0.00  0.00 - 0.08 ng/mL   Comment 3            Dg Chest 2 View  01/18/2012  *RADIOLOGY REPORT*  Clinical Data: Chest pain  CHEST - 2 VIEW  Comparison: 01/10/2010  Findings: Lung volumes are low with crowding of the bronchovascular markings.  Heart size upper limits of normal.  No pleural effusion. No acute osseous finding.  IMPRESSION: No acute cardiopulmonary process.  Original Report Authenticated By: Arline Asp, M.D.     Date: 01/19/2012  Rate: 85  Rhythm: normal sinus rhythm  QRS Axis: normal  Intervals: normal  ST/T Wave abnormalities: nonspecific ST/T changes  Conduction Disutrbances:none  Narrative Interpretation:   Old EKG Reviewed: none available  IV fluids. Headache cocktail provided. EKG, labs and imaging obtained and reviewed as above.  Medications does not suggest ACS. Screening EKG and serial troponins within normal limits.  Headache improved with medications and fluids as above. Anxiety improved. No change on exam on recheck. Stable for discharge home and primary care followup. Plan close primary care followup.  MDM   Anxiety attack headache  Nursing notes reviewed. Vital signs reviewed. Old records reviewed. Labs, EKG and imaging obtained and reviewed as above. Improved with IV medications as above.      Teressa Lower, MD 01/19/12 [phone]

## 2012-01-19 NOTE — ED Notes (Signed)
Pt states he is having an anxiety attack, Dr Loistine Chance informed. Comfort measures attempted. Wife at bedside

## 2012-01-24 NOTE — Progress Notes (Signed)
Quick Note:  Ask him to set up a follow-up visit with me in august ______

## 2012-02-06 ENCOUNTER — Telehealth: Payer: Self-pay | Admitting: Internal Medicine

## 2012-02-07 ENCOUNTER — Ambulatory Visit (INDEPENDENT_AMBULATORY_CARE_PROVIDER_SITE_OTHER): Payer: 59 | Admitting: Emergency Medicine

## 2012-02-07 VITALS — BP 138/87 | HR 73 | Temp 98.2°F | Resp 18 | Ht 72.0 in | Wt 326.0 lb

## 2012-02-07 DIAGNOSIS — M6281 Muscle weakness (generalized): Secondary | ICD-10-CM

## 2012-02-07 LAB — COMPREHENSIVE METABOLIC PANEL
Albumin: 4.1 g/dL (ref 3.5–5.2)
Alkaline Phosphatase: 90 U/L (ref 39–117)
CO2: 27 mEq/L (ref 19–32)
Glucose, Bld: 87 mg/dL (ref 70–99)
Potassium: 4 mEq/L (ref 3.5–5.3)
Sodium: 139 mEq/L (ref 135–145)
Total Protein: 7.3 g/dL (ref 6.0–8.3)

## 2012-02-07 LAB — POCT CBC
HCT, POC: 50.3 % (ref 43.5–53.7)
Hemoglobin: 15.9 g/dL (ref 14.1–18.1)
Lymph, poc: 1.8 (ref 0.6–3.4)
MCHC: 31.6 g/dL — AB (ref 31.8–35.4)
POC Granulocyte: 1.6 — AB (ref 2–6.9)
RBC: 5.62 M/uL (ref 4.69–6.13)

## 2012-02-07 LAB — TSH: TSH: 1.867 u[IU]/mL (ref 0.350–4.500)

## 2012-02-07 MED ORDER — SUCRALFATE 1 G PO TABS: 1.0000 g | ORAL_TABLET | Freq: Four times a day (QID) | ORAL | Status: DC

## 2012-02-07 NOTE — Telephone Encounter (Signed)
Left message for patient to call back  

## 2012-02-07 NOTE — Telephone Encounter (Signed)
Patient having continued vomiting episodes and continuous nausea despite metoclopramide and dexilant.  He will come in and see [name] tomorrow at 9:45.

## 2012-02-07 NOTE — Progress Notes (Signed)
Date:  02/07/2012   Name:  Craig Guzman   DOB:  Jun 14, 1971   MRN:  [id number]  PCP:  [name], MD    Chief Complaint: Follow-up, Nausea and Fatigue   History of Present Illness:  Craig Guzman is a 41 y.o. very pleasant male patient who presents with the following:  Worsening symptoms of reflux.  waterbrash and nocturnal vomiting.  Nausea and vomiting when bends over.  No fever or chills, loose stool, melena, or BRBPR  Says has long term weakness in arms that interfere with his work and under workup by neurology.  Had EMG/NCS in July but does not know the results of the test yet.  No neck pain or radicular symptoms.  Patient Active Problem List  Diagnosis  . GERD (gastroesophageal reflux disease)  . Crohn's disease of stomach and colon  . Constipation  . Long-term use of immunosuppressant medication  . Gastroparesis  . Migraine headache    Past Medical History  Diagnosis Date  . GERD (gastroesophageal reflux disease)   . HTN (hypertension)   . Gastric ulcer     antral  . Hiatal hernia   . Biliary dyskinesia   . Anxiety   . Obesity   . History of migraine headaches   . Allergy     trees/ grasses/ animals/dust/mold  . Crohn's disease     Stomach, terminal ileum, cecum  . Kidney stone   . Crohn's disease   . Ulcer     Past Surgical History  Procedure Date  . Cholecystectomy 2011    [place]  . Vasectomy     bilateral w/lysis of penile adhesions  . Esophagogastroduodenoscopy 05/04/11, 07/26/11    granulomatous gastritis - Crohn's  . Multiple tooth extractions 2005  . Colonoscopy 07/26/11    Crohn's colitis, ileitis    History  Substance Use Topics  . Smoking status: Current Everyday Smoker -- 0.5 packs/day for 10 years    Types: Cigarettes  . Smokeless tobacco: Former Systems developer   Comment: Counseling sheet given in exam room   . Alcohol Use: No    Family History  Problem Relation Age of Onset  . Colon polyps Father   . Diabetes Father   . Colon  cancer Neg Hx   . Hypertension Father   . Hypertension Mother     Allergies  Allergen Reactions  . Zithromax (Azithromycin) Hypertension    Medication list has been reviewed and updated.  Current Outpatient Prescriptions on File Prior to Visit  Medication Sig Dispense Refill  . baclofen (LIORESAL) 10 MG tablet Take 10 mg by mouth 2 (two) times daily.      Marland Kitchen DEXILANT 60 MG capsule TAKE ONE CAPSULE BY MOUTH EVERY DAY RECHECK BEFORE OUT  90 capsule  0  . hyoscyamine (LEVSIN SL) 0.125 MG SL tablet Place 1 tablet (0.125 mg total) under the tongue every 4 (four) hours as needed for cramping. May take 2 tablets  60 tablet  2  . lisinopril-hydrochlorothiazide (PRINZIDE,ZESTORETIC) 20-12.5 MG per tablet Take 2 tablets by mouth daily.  180 tablet  1  . mercaptopurine (PURINETHOL) 50 MG tablet Take 150 mg by mouth daily. Give on an empty stomach 1 hour before or 2 hours after meals. Caution: Chemotherapy.      . metoCLOPramide (REGLAN) 10 MG tablet Take 1 tablet (10 mg total) by mouth 3 (three) times daily before meals.  90 tablet  1  . topiramate (TOPAMAX) 25 MG tablet Take 150 mg by mouth daily. 50 mg  at bedtime      . verapamil (COVERA HS) 180 MG (CO) 24 hr tablet Take 1 tablet (180 mg total) by mouth 2 (two) times daily.  180 tablet  1    Review of Systems:  As per HPI, otherwise negative.    Physical Examination: Filed Vitals:   02/07/12 1536  BP: 138/87  Pulse: 73  Temp: 98.2 F (36.8 C)  Resp: 18   Filed Vitals:   02/07/12 1536  Height: 6' (1.829 m)  Weight: 326 lb (147.873 kg)   Body mass index is 44.21 kg/(m^2). Ideal Body Weight: Weight in (lb) to have BMI = 25: 183.9   GEN: WDWN, NAD, Non-toxic, A & O x 3 HEENT: Atraumatic, Normocephalic. Neck supple. No masses, No LAD. Ears and Nose: No external deformity. CV: RRR, No M/G/R. No JVD. No thrill. No extra heart sounds. PULM: CTA B, no wheezes, crackles, rhonchi. No retractions. No resp. distress. No accessory muscle  use. ABD: S, NT, ND, +BS. No rebound. No HSM. EXTR: No c/c/e NEURO Normal gait.  PSYCH: Normally interactive. Conversant. Not depressed or anxious appearing.  Calm demeanor.    Assessment and Plan: crohns GERD Weakness EMG and NCS pending results Has appt with GI in am. Start on carafate  Follow up as needed.  Check labs r/o low thyroid and potassium  Roselee Culver, MD

## 2012-02-08 ENCOUNTER — Encounter: Payer: Self-pay | Admitting: Internal Medicine

## 2012-02-08 ENCOUNTER — Ambulatory Visit (INDEPENDENT_AMBULATORY_CARE_PROVIDER_SITE_OTHER): Payer: 59 | Admitting: Internal Medicine

## 2012-02-08 VITALS — BP 134/98 | HR 88 | Ht 72.0 in | Wt 323.1 lb

## 2012-02-08 DIAGNOSIS — IMO0001 Reserved for inherently not codable concepts without codable children: Secondary | ICD-10-CM

## 2012-02-08 DIAGNOSIS — K5090 Crohn's disease, unspecified, without complications: Secondary | ICD-10-CM

## 2012-02-08 DIAGNOSIS — M791 Myalgia, unspecified site: Secondary | ICD-10-CM

## 2012-02-08 DIAGNOSIS — M25511 Pain in right shoulder: Secondary | ICD-10-CM

## 2012-02-08 DIAGNOSIS — M25519 Pain in unspecified shoulder: Secondary | ICD-10-CM

## 2012-02-08 LAB — VITAMIN D 25 HYDROXY (VIT D DEFICIENCY, FRACTURES): Vit D, 25-Hydroxy: 11 ng/mL — ABNORMAL LOW (ref 30–89)

## 2012-02-08 NOTE — Assessment & Plan Note (Signed)
EGD

## 2012-02-08 NOTE — Patient Instructions (Addendum)
  You have been given a separate informational sheet regarding your tobacco use, the importance of quitting and local resources to help you quit.  You have been scheduled for an endoscopy with propofol. Please follow written instructions given to you at your visit today. If you use inhalers (even only as needed), please bring them with you on the day of your procedure.  Make an appointment with the orthopedic Dr. to get your right shoulder pain evaluated.   Try and wait 3 hours between eating and lying down.  You may take Carafate as needed.   We will obtain [facility] for [name].   Hold your Reglan for now.  Thank you for choosing me and [facility].  Gatha Mayer, M.D., [facility]

## 2012-02-08 NOTE — Progress Notes (Addendum)
  Subjective:    Patient ID: Craig Guzman, male    DOB: 07-21-1970, 41 y.o.   MRN: [id number]  HPI The patient returns for followup of his Crohn's disease of the stomach and ileocecum. He still has upper abdominal pain and vomiting. Vomiting remains a predominant issue. He is not making it to work every day and having to come home and is working less and that is causing financial difficulties according to his wife. He notices that if he does not eat for 2 hours before work, with a shift that starts at 11 AM, he will do much better. His constipation is improved, his pain is less though he still has some. He is having heartburn despite his PPI therapy.  His headache therapy was changed to baclofen. He is not convinced that is helping at this point. He has other issues with twitching in his upper extremities and his hands and pain. When questioned more closely has not myalgia, he has shoulder problems particularly on the right, he is unable or raise that up above his head without significant pain if at all. He had an EMG performed he tells me was normal. I do not have that result.  Also having some muscle weakness in arms perceived, he ?'s prednisone. Medications, allergies, past medical history, past surgical history, family history and social history are reviewed and updated in the EMR.   Review of Systems As above    Objective:   Physical Exam Obese, NAD Eyes anicteric Lungs are clear Heart S1S2 no murmur Abdomen is obese, soft and nontender Ext: right shoulder - unable to raise without pain, left is ok        Assessment & Plan:   1. Crohn's disease of stomach and colon   This is improved though he still has to many symptoms. He may need biologic therapy. I discussed this in brief with him today. The first step is to repeat an EGD to assess disease activity in the stomach. She continued diet modification prior to going to work. Hopefully that'll help him work more. He may need  short-term disability depending upon what going on.  See below also.  2. Muscle pain   I think he is probably having side effects her metoclopramide. I've asked him to stop metoclopramide. Note that he does not have any tardive dyskinesia. I have asked for copies of the EMG and [facility].   3. Right shoulder pain   I think he probably has some sort of a rotator cuff issue and she C. orthopedics. His son sees [facility], his wife says she will make an appointment. I asked them to make sure I get a copy.    CC: [name], MD    EMG report of 01/16/2012 is reviewed and is normal.  [facility] note of 01/15/2012 Dr. Ruthann Cancer C. Domingo Cocking - reviewed.  Makes me think that some of his current sxs may be migraine as opposed to Crohn's though hard to sort it out.

## 2012-02-12 ENCOUNTER — Encounter: Payer: Self-pay | Admitting: Emergency Medicine

## 2012-02-12 ENCOUNTER — Other Ambulatory Visit: Payer: Self-pay | Admitting: Emergency Medicine

## 2012-02-12 MED ORDER — ERGOCALCIFEROL 1.25 MG (50000 UT) PO CAPS: 50000.0000 [IU] | ORAL_CAPSULE | ORAL | Status: DC

## 2012-02-19 ENCOUNTER — Ambulatory Visit: Payer: 59 | Admitting: Internal Medicine

## 2012-02-22 ENCOUNTER — Ambulatory Visit (AMBULATORY_SURGERY_CENTER): Payer: 59 | Admitting: Internal Medicine

## 2012-02-22 ENCOUNTER — Encounter: Payer: Self-pay | Admitting: Internal Medicine

## 2012-02-22 VITALS — BP 135/76 | HR 69 | Temp 98.5°F | Resp 16 | Ht 72.0 in | Wt 323.0 lb

## 2012-02-22 DIAGNOSIS — K2970 Gastritis, unspecified, without bleeding: Secondary | ICD-10-CM

## 2012-02-22 DIAGNOSIS — K5090 Crohn's disease, unspecified, without complications: Secondary | ICD-10-CM

## 2012-02-22 DIAGNOSIS — D1330 Benign neoplasm of unspecified part of small intestine: Secondary | ICD-10-CM

## 2012-02-22 DIAGNOSIS — IMO0001 Reserved for inherently not codable concepts without codable children: Secondary | ICD-10-CM

## 2012-02-22 DIAGNOSIS — K2990 Gastroduodenitis, unspecified, without bleeding: Secondary | ICD-10-CM

## 2012-02-22 MED ORDER — SODIUM CHLORIDE 0.9 % IV SOLN: 500.0000 mL | INTRAVENOUS | Status: DC

## 2012-02-22 NOTE — Patient Instructions (Addendum)
The stomach looks better. There was a possible ulcer in the duodenum (intestine) Will call with biopsy results and recommendations.  Thank you for choosing me and [facility].  Gatha Mayer, MD, FACG  YOU HAD AN ENDOSCOPIC PROCEDURE TODAY AT [facility]: Refer to the procedure report that was given to you for any specific questions about what was found during the examination.  If the procedure report does not answer your questions, please call your gastroenterologist to clarify.  If you requested that your care partner not be given the details of your procedure findings, then the procedure report has been included in a sealed envelope for you to review at your convenience later.  YOU SHOULD EXPECT: Some feelings of bloating in the abdomen. Passage of more gas than usual.  Walking can help get rid of the air that was put into your GI tract during the procedure and reduce the bloating. If you had a lower endoscopy (such as a colonoscopy or flexible sigmoidoscopy) you may notice spotting of blood in your stool or on the toilet paper. If you underwent a bowel prep for your procedure, then you may not have a normal bowel movement for a few days.  DIET: Your first meal following the procedure should be a light meal and then it is ok to progress to your normal diet.  A half-sandwich or bowl of soup is an example of a good first meal.  Heavy or fried foods are harder to digest and may make you feel nauseous or bloated.  Likewise meals heavy in dairy and vegetables can cause extra gas to form and this can also increase the bloating.  Drink plenty of fluids but you should avoid alcoholic beverages for 24 hours.  ACTIVITY: Your care partner should take you home directly after the procedure.  You should plan to take it easy, moving slowly for the rest of the day.  You can resume normal activity the day after the procedure however you should NOT DRIVE or use heavy machinery for 24  hours (because of the sedation medicines used during the test).    SYMPTOMS TO REPORT IMMEDIATELY: A gastroenterologist can be reached at any hour.  During normal business hours, 8:30 AM to 5:00 PM Monday through Friday, call [phone].  After hours and on weekends, please call the GI answering service at [phone] who will take a message and have the physician on call contact you.   Following lower endoscopy (colonoscopy or flexible sigmoidoscopy):  Excessive amounts of blood in the stool  Significant tenderness or worsening of abdominal pains  Swelling of the abdomen that is new, acute  Fever of 100F or higher  Following upper endoscopy (EGD)  Vomiting of blood or coffee ground material  New chest pain or pain under the shoulder blades  Painful or persistently difficult swallowing  New shortness of breath  Fever of 100F or higher  Black, tarry-looking stools  FOLLOW UP: If any biopsies were taken you will be contacted by phone or by letter within the next 1-3 weeks.  Call your gastroenterologist if you have not heard about the biopsies in 3 weeks.  Our staff will call the home number listed on your records the next business day following your procedure to check on you and address any questions or concerns that you may have at that time regarding the information given to you following your procedure. This is a courtesy call and so if there is no answer at  the home number and we have not heard from you through the emergency physician on call, we will assume that you have returned to your regular daily activities without incident.  SIGNATURES/CONFIDENTIALITY: You and/or your care partner have signed paperwork which will be entered into your electronic medical record.  These signatures attest to the fact that that the information above on your After Visit Summary has been reviewed and is understood.  Full responsibility of the confidentiality of this discharge information lies with  you and/or your care-partner.   Please follow all discharge instructions given to you by the recovery room nurse. If you have any questions or problems after discharge please call one of the numbers listed above. You will receive a phone call in the am to see how you are doing and answer any questions you may have. Thank you for choosing [facility] for your health care needs.

## 2012-02-22 NOTE — Op Note (Signed)
[facility] [address]  [address] [place], [address]   ENDOSCOPY PROCEDURE REPORT  PATIENT: Craig, Guzman  MR#: [id number] BIRTHDATE: 1970-10-08 , 41  yrs. old GENDER: Male ENDOSCOPIST: Gatha Mayer, MD, [facility] REFERRED BY: PROCEDURE DATE:  02/22/2012 PROCEDURE:  EGD w/ biopsy ASA CLASS:     Class II INDICATIONS: MEDICATIONS: propofol (Diprivan) 336m IV, MAC sedation, administered by CRNA, and These medications were titrated to patient response per physician's verbal order TOPICAL ANESTHETIC: Cetacaine Spray  DESCRIPTION OF PROCEDURE: After the risks benefits and alternatives of the procedure were thoroughly explained, informed consent was obtained.  The LB GIF-H180 [id number] was introduced through the mouth and advanced to the second portion of the duodenum. Without limitations.  The instrument was slowly withdrawn as the mucosa was fully examined.      DUODENUM: A small punctate ulcer was found in the 2nd part of the duodenum.  Biopsies were taken at the center of the ulcer.  STOMACH: Moderate chronic gastritis (inflammation) was found in the gastric antrum.  Multiple biopsies were performed using cold forceps.  Sample sent for histology.  ESOPHAGUS: The mucosa of the esophagus appeared normal.  Retroflexed views revealed no abnormalities.     The scope was then withdrawn from the patient and the procedure completed.  COMPLICATIONS: There were no complications. ENDOSCOPIC IMPRESSION: 1.   Small ulcer was found in the 2nd part of the duodenum 2.   Chronic gastritis (inflammation) was found in the gastric antrum; multiple biopsies this looks better than before - no ulcers today 3.   The mucosa of the esophagus appeared normal  RECOMMENDATIONS: 1.  Await pathology results 2.   Will call with results  REPEAT EXAM:  eSigned:  CGatha Mayer MD, [facility]08/22/2013 12:19 PM   CAE:SLPNPYYGCarlota Raspberry MD and The Patient

## 2012-02-22 NOTE — Progress Notes (Signed)
Patient did not experience any of the following events: a burn prior to discharge; a fall within the facility; wrong site/side/patient/procedure/implant event; or a hospital transfer or hospital admission upon discharge from the facility. (G8907) Patient did not have preoperative order for IV antibiotic SSI prophylaxis. (G8918)  

## 2012-02-23 ENCOUNTER — Telehealth: Payer: Self-pay | Admitting: *Deleted

## 2012-02-23 NOTE — Telephone Encounter (Signed)
  Follow up Call-  Call back number 02/22/2012 05/04/2011  Post procedure Call Back phone  # [phone]  Permission to leave phone message Yes -     Patient questions:  No message left.."person is out of the calling area."

## 2012-03-08 ENCOUNTER — Ambulatory Visit: Payer: 59 | Admitting: Family Medicine

## 2012-03-15 ENCOUNTER — Ambulatory Visit: Payer: 59 | Admitting: Family Medicine

## 2012-03-21 ENCOUNTER — Telehealth: Payer: Self-pay

## 2012-03-21 NOTE — Telephone Encounter (Signed)
[facility] is calling to request medical records on pt and is needing most recent ov notes, labs and ekg. She is also needing sleep study and stress test that was done in 2008 or 2009, please fax info to [phone] if any questions please contact Sunday Spillers @ [phone] ext [phone]

## 2012-03-21 NOTE — Telephone Encounter (Signed)
Faxed over the office note, labs, ekg, and sleep study.  They will need to get the stress test from [facility] we do not have a copy here in our office.

## 2012-03-22 ENCOUNTER — Encounter: Payer: Self-pay | Admitting: Family Medicine

## 2012-03-22 ENCOUNTER — Ambulatory Visit (INDEPENDENT_AMBULATORY_CARE_PROVIDER_SITE_OTHER): Payer: 59 | Admitting: Family Medicine

## 2012-03-22 VITALS — BP 152/103 | HR 85 | Temp 98.0°F | Resp 18 | Ht 72.0 in | Wt 330.4 lb

## 2012-03-22 DIAGNOSIS — Z23 Encounter for immunization: Secondary | ICD-10-CM

## 2012-03-22 DIAGNOSIS — K5900 Constipation, unspecified: Secondary | ICD-10-CM

## 2012-03-22 DIAGNOSIS — I10 Essential (primary) hypertension: Secondary | ICD-10-CM

## 2012-03-22 NOTE — Progress Notes (Signed)
  Subjective:    Patient ID: Craig Guzman, male    DOB: [birth year], 41 y.o.   MRN: [id number]  HPI Craig Guzman is a 41 y.o. 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 Onnie Graham at [facility] ortho.  Scheduled for surgery in 4 days. Pain since January. Taking hydrocodone for pain - 3 - 4 pills per day.  More sore today and attributes the elevated pressure to this today.Pain 6/10, but hasn't taken hydrocodone today. Last BM yesterday.   Usually BP 140/98.  Has been in physical therapy. No chest pains, lightheadedness or dizziness. Did miss 2 days of BP meds this week.   Working on paperwork for short term disability - working with GI office on this.  Hep A vaccine #1 given 09/01/11. No side effects to vaccine. No recent fever.    Review of Systems As above.     Objective:   Physical Exam  Constitutional: He is oriented to person, place, and time. He appears well-developed and well-nourished.       Overweight.   HENT:  Head: Normocephalic and atraumatic.  Eyes: EOM are normal. Pupils are equal, round, and reactive to light.  Neck: No JVD present. Carotid bruit is not present.  Cardiovascular: Normal rate, regular rhythm and normal heart sounds.   No murmur heard. Pulmonary/Chest: Effort normal and breath sounds normal. He has no rales.  Abdominal: Soft. Bowel sounds are normal.  Musculoskeletal: He exhibits no edema.  Neurological: He is alert and oriented to person, place, and time.  Skin: Skin is warm and dry.  Psychiatric: He has a normal mood and affect. His behavior is normal.        Assessment & Plan:  Craig Guzman is a 41 y.o. male 1. Hypertension   2. Constipation   3. Need for hepatitis A immunization    Hep A vaccine # 2 given.   Constipation with narcotic use - start colace as stool softener, increase fluids with fiber. miralax x 1 if needed, or mineral oil enema.   HTN - increase today likely combo of pain and recent missed doses of antihypertensives.   Continue same doses of meds, check home BP's and if remain above 150/100 - call in few days as dose adjustment may be necessary before surgery. rtc precautions.

## 2012-03-29 ENCOUNTER — Ambulatory Visit (INDEPENDENT_AMBULATORY_CARE_PROVIDER_SITE_OTHER): Payer: 59 | Admitting: Internal Medicine

## 2012-03-29 ENCOUNTER — Encounter: Payer: Self-pay | Admitting: Internal Medicine

## 2012-03-29 VITALS — BP 132/84 | HR 80 | Ht 72.0 in | Wt 333.4 lb

## 2012-03-29 DIAGNOSIS — K5900 Constipation, unspecified: Secondary | ICD-10-CM

## 2012-03-29 DIAGNOSIS — Z79899 Other long term (current) drug therapy: Secondary | ICD-10-CM

## 2012-03-29 DIAGNOSIS — K5090 Crohn's disease, unspecified, without complications: Secondary | ICD-10-CM

## 2012-03-29 MED ORDER — ONDANSETRON 4 MG PO TBDP: 4.0000 mg | ORAL_TABLET | Freq: Three times a day (TID) | ORAL | Status: DC | PRN

## 2012-03-29 NOTE — Patient Instructions (Addendum)
You have been given a separate informational sheet regarding your tobacco use, the importance of quitting and local resources to help you quit.  We have sent the following medications to your pharmacy for you to pick up at your convenience: Generic Zofran  Use Miralax every day and take Dulcolax as needed for constipation.  These are both over the counter medicines.  Come to our lab in November and have labs drawn for :  CBC, LFT's   No appointment needed, they are open 7:30-5:30pm.  Thank you for choosing me and [facility].  Gatha Mayer, M.D., [facility]

## 2012-03-29 NOTE — Progress Notes (Signed)
  Subjective:    Patient ID: Craig Guzman, male    DOB: 01-30-1971, 41 y.o.   MRN: [id number]  HPI Patient returns in followup with his wife. He is better with some regurgitation at times but much less nausea and vomiting. He feels he overeats then he'll regurgitate. His last endoscopy showed marked improvement in the inflammation in the stomach.  His headaches are better.  He is more constipated using narcotics for his shoulder. He has been using some oxycodone, he had arthroscopy this week and apparently did not have a rotator cuff problem but had some odynophagia and or fragments perhaps. He is due to start physical therapy.  He has been out of work since early August, apparently unable to get onto short-term disability for his GI problems or for the orthopedic problems. He and his wife indicate there will be some more paper work coming to the office which I told him I be happy to fill out and to try to help as best as possible as I do think that his recurrent gastrointestinal problems and his shoulder problems were making it difficult for him to work. Sounds like a shoulder problems would be the issue.  Medications, allergies, past medical history, past surgical history, family history and social history are reviewed and updated in the EMR.  Review of Systems As above    Objective:   Physical Exam General:  NAD though he is in some pain with a shoulder Eyes:   anicteric Abdomen:  soft and nontender, BS+       Assessment & Plan:   1. Crohn's disease of stomach and colon - improved   2. Long-term use of immunosuppressant medication   3. Constipation from narcotic use because of shoulder pain    1. Please see problem oriented charting as well. 2. Miralax and when necessary Dulcolax for constipation 3. He check LFTs and CBC in November as part of protocol on 6 MP 4. Turned to see me in one month approximately. I think I will need to recheck him related to the out of work status. I  have suggested he try for the short-term disability before because he was leaving work so often when the stomach was bothering him. I think that was reasonable and will try to provide supportive documentation on the forms. I don't think he can work now because of the shoulder and suggested that they contact orthopedics or sent paperwork to them for the appropriate dates as well.  CC: [name], MD and Lennette Bihari SUPPLE, MD

## 2012-03-29 NOTE — Assessment & Plan Note (Signed)
Labs ok Recheck in November (CBC, LFT's)

## 2012-03-29 NOTE — Assessment & Plan Note (Signed)
Improving. Reminded he will still have some dysfunction from probable chronic damage to stomach - though minimal inflammation only now. Narcotics may also affect.

## 2012-04-04 ENCOUNTER — Telehealth: Payer: Self-pay | Admitting: Internal Medicine

## 2012-04-04 NOTE — Telephone Encounter (Signed)
Advised him to speak to [employer].

## 2012-04-04 NOTE — Telephone Encounter (Signed)
Patient's company has denied his short term disability.  He is requesting a letter from [name] stating that he recommended or at least discussed the possibility of short term disability at the office visit on 02/08/12.  He is about to have shoulder surgery so he only needs it from August till the now.  His orthopedic MD is taking care of the future dates for his shoulder.

## 2012-04-05 ENCOUNTER — Encounter: Payer: Self-pay | Admitting: Internal Medicine

## 2012-09-02 ENCOUNTER — Telehealth: Payer: Self-pay

## 2012-09-02 NOTE — Telephone Encounter (Signed)
Patient reports that he is taking his 6MP that this must be a mistake on the insurance part.  He is scheduled for an office visit on 09/25/12

## 2012-09-02 NOTE — Telephone Encounter (Signed)
Left message for patient to call back  

## 2012-09-02 NOTE — Telephone Encounter (Signed)
Message copied by Marlon Pel on Mon Sep 02, 2012  8:58 AM ------      Message from: Gatha Mayer      Created: Fri Aug 30, 2012  5:09 PM      Regarding: compliance?       Not seen since Sept 2013            Received letter from [place] re: not filling meds            Please follow-up with patient ------

## 2012-09-20 DIAGNOSIS — N200 Calculus of kidney: Secondary | ICD-10-CM

## 2012-09-20 HISTORY — DX: Calculus of kidney: N20.0

## 2012-09-25 ENCOUNTER — Other Ambulatory Visit (INDEPENDENT_AMBULATORY_CARE_PROVIDER_SITE_OTHER): Payer: 59

## 2012-09-25 ENCOUNTER — Encounter: Payer: Self-pay | Admitting: Internal Medicine

## 2012-09-25 ENCOUNTER — Ambulatory Visit (INDEPENDENT_AMBULATORY_CARE_PROVIDER_SITE_OTHER): Payer: 59 | Admitting: Internal Medicine

## 2012-09-25 VITALS — BP 100/80 | HR 80 | Ht 72.0 in | Wt 313.2 lb

## 2012-09-25 DIAGNOSIS — K5090 Crohn's disease, unspecified, without complications: Secondary | ICD-10-CM

## 2012-09-25 DIAGNOSIS — Z7960 Long term (current) use of unspecified immunomodulators and immunosuppressants: Secondary | ICD-10-CM

## 2012-09-25 DIAGNOSIS — Z79899 Other long term (current) drug therapy: Secondary | ICD-10-CM

## 2012-09-25 DIAGNOSIS — K3184 Gastroparesis: Secondary | ICD-10-CM

## 2012-09-25 LAB — CBC WITH DIFFERENTIAL/PLATELET
Basophils Relative: 0.6 % (ref 0.0–3.0)
Eosinophils Relative: 4 % (ref 0.0–5.0)
HCT: 46.9 % (ref 39.0–52.0)
Hemoglobin: 15.6 g/dL (ref 13.0–17.0)
Lymphs Abs: 1.7 10*3/uL (ref 0.7–4.0)
MCV: 87.3 fl (ref 78.0–100.0)
Monocytes Absolute: 0.8 10*3/uL (ref 0.1–1.0)
Neutro Abs: 2 10*3/uL (ref 1.4–7.7)
Platelets: 237 10*3/uL (ref 150.0–400.0)
RBC: 5.37 Mil/uL (ref 4.22–5.81)
WBC: 4.7 10*3/uL (ref 4.5–10.5)

## 2012-09-25 LAB — COMPREHENSIVE METABOLIC PANEL
Alkaline Phosphatase: 99 U/L (ref 39–117)
BUN: 15 mg/dL (ref 6–23)
Creatinine, Ser: 1.4 mg/dL (ref 0.4–1.5)
GFR: 71.33 mL/min (ref >60.00)
Glucose, Bld: 104 mg/dL — ABNORMAL HIGH (ref 70–99)
Total Bilirubin: 0.7 mg/dL (ref 0.3–1.2)

## 2012-09-25 LAB — C-REACTIVE PROTEIN: CRP: 0.6 mg/dL (ref 0.5–20.0)

## 2012-09-25 MED ORDER — MERCAPTOPURINE 50 MG PO TABS: 150.0000 mg | ORAL_TABLET | Freq: Every day | ORAL | Status: DC

## 2012-09-25 MED ORDER — METOCLOPRAMIDE HCL 10 MG PO TABS: 10.0000 mg | ORAL_TABLET | Freq: Three times a day (TID) | ORAL | Status: DC | PRN

## 2012-09-25 NOTE — Assessment & Plan Note (Signed)
Improved but still having vomiting and intermittent diarrhea

## 2012-09-25 NOTE — Progress Notes (Signed)
  Subjective:    Patient ID: Craig Guzman, male    DOB: May 07, 1971, 42 y.o.   MRN: [id number]  HPI He has been better overall but every 2-3 weeks has diarrhea. He will vomit or regurgitate if he eats solid food before going to work so he has been eating soft foods like yogurt. This worries his wife. Wt Readings from Last 3 Encounters:  09/25/12 313 lb 3.2 oz (142.067 kg)  03/29/12 333 lb 6 oz (151.218 kg)  03/22/12 330 lb 6.4 oz (149.868 kg)   Medications, allergies, past medical history, past surgical history, family history and social history are reviewed and updated in the EMR.  Review of Systems Shoulder better after surgery Still has some muscle twitching - he says orthopedist told him that will happen    Objective:   Physical Exam General:  NAD Eyes:   anicteric Lungs:  clear Heart:  S1S2 no rubs, murmurs or gallops Abdomen:  soft and nontender, BS+, obese, no splash     Data Reviewed:  Previous GI notes and last EGD/colonoscopy        Assessment & Plan:  Crohn's disease of stomach and colon - Plan: Thiopurine Metabolites  Gastroparesis - Plan: metoCLOPramide (REGLAN) 10 MG tablet  Long-term use of immunosuppressant medication - Plan: Thiopurine Metabolites  1. Retry metaclopramide - watch for side effects 2. Cbc, cmet, crp, thiopurine metabolites 3. Rev 6 weeks 4. Discussed biologics briefly    Chemistry      Component Value Date/Time   NA 141 09/25/2012 1101   K 5.3* 09/25/2012 1101   CL 107 09/25/2012 1101   CO2 27 09/25/2012 1101   BUN 15 09/25/2012 1101   CREATININE 1.4 09/25/2012 1101   CREATININE 1.11 02/07/2012 1610      Component Value Date/Time   CALCIUM 9.7 09/25/2012 1101   ALKPHOS 99 09/25/2012 1101   AST 22 09/25/2012 1101   ALT 30 09/25/2012 1101   BILITOT 0.7 09/25/2012 1101     Lab Results  Component Value Date   WBC 4.7 09/25/2012   HGB 15.6 09/25/2012   HCT 46.9 09/25/2012   MCV 87.3 09/25/2012   PLT 237.0 09/25/2012

## 2012-09-25 NOTE — Patient Instructions (Addendum)
Your physician has requested that you go to the basement for lab work before leaving today.  We have sent medications to your pharmacy for you to pick up at your convenience.  Follow up with [place] in 6 weeks.  Thank you for choosing me and [facility].  Gatha Mayer, M.D., [facility]

## 2012-10-05 ENCOUNTER — Ambulatory Visit (INDEPENDENT_AMBULATORY_CARE_PROVIDER_SITE_OTHER): Payer: 59 | Admitting: Family Medicine

## 2012-10-05 VITALS — BP 140/90 | HR 71 | Temp 98.9°F | Resp 16 | Ht 72.0 in | Wt 318.0 lb

## 2012-10-05 DIAGNOSIS — R110 Nausea: Secondary | ICD-10-CM

## 2012-10-05 DIAGNOSIS — E0789 Other specified disorders of thyroid: Secondary | ICD-10-CM

## 2012-10-05 DIAGNOSIS — E041 Nontoxic single thyroid nodule: Secondary | ICD-10-CM

## 2012-10-05 LAB — POCT CBC
Granulocyte percent: 47.2 %G (ref 37–80)
HCT, POC: 44.9 % (ref 43.5–53.7)
Hemoglobin: 14.3 g/dL (ref 14.1–18.1)
MCV: 89.8 fL (ref 80–97)
POC Granulocyte: 2.5 (ref 2–6.9)

## 2012-10-05 LAB — T4, FREE: Free T4: 1.03 ng/dL (ref 0.80–1.80)

## 2012-10-05 MED ORDER — ONDANSETRON 4 MG PO TBDP: 4.0000 mg | ORAL_TABLET | Freq: Three times a day (TID) | ORAL | Status: DC | PRN

## 2012-10-05 NOTE — Progress Notes (Signed)
[address] [place] [address]   Phone [phone]  Subjective:    Patient ID: Craig Guzman, male    DOB: [birth year], 42 y.o.   MRN: [id number]  HPI Pt presents to clinic with a lump on R side of neck that stared 3 days ago and then yesterday he started to feel sick with a sore throat. - OTC meds - none.  No sick contacts.  He has seasonal allergies but really just lives with the symptoms because all the medications upset his stomach - even the nasal sprays he has used in the past upset his Crohn's.    Review of Systems  Constitutional: Positive for fever (subjective) and chills.  HENT: Positive for ear pain, congestion (typically congested due to his allergies - not different) and sore throat.   Respiratory: Positive for cough.   Musculoskeletal: Positive for myalgias.       Objective:   Physical Exam  Vitals reviewed. Constitutional: He is oriented to person, place, and time. He appears well-developed and well-nourished.  HENT:  Head: Normocephalic and atraumatic.  Right Ear: Hearing, tympanic membrane, external ear and ear canal normal.  Left Ear: Hearing, tympanic membrane, external ear and ear canal normal.  Nose: Mucosal edema (pale) present.  Mouth/Throat: Oropharynx is clear and moist. No oropharyngeal exudate, posterior oropharyngeal edema, posterior oropharyngeal erythema or tonsillar abscesses.  Neck: Trachea normal. Neck supple. Mass (tender mass on the R side about 2-3 cm in size) and thyromegaly (both sides enlarged but only tender mass on the right side) present.  Cardiovascular: Normal rate, regular rhythm and normal heart sounds.   No murmur heard. Pulmonary/Chest: Effort normal and breath sounds normal.  Neurological: He is alert and oriented to person, place, and time.  Skin: Skin is warm and dry. No rash noted. No erythema.  Psychiatric: He has a normal mood and affect. His behavior is normal. Judgment and thought content normal.   Results for orders  placed in visit on 10/05/12  POCT CBC      Result Value Range   WBC 5.4  4.6 - 10.2 K/uL   Lymph, poc 2.2  0.6 - 3.4   POC LYMPH PERCENT 40.5  10 - 50 %L   MID (cbc) 0.7  0 - 0.9   POC MID % 12.3 (*) 0 - 12 %M   POC Granulocyte 2.5  2 - 6.9   Granulocyte percent 47.2  37 - 80 %G   RBC 5.00  4.69 - 6.13 M/uL   Hemoglobin 14.3  14.1 - 18.1 g/dL   HCT, POC 44.9  43.5 - 53.7 %   MCV 89.8  80 - 97 fL   MCH, POC 28.6  27 - 31.2 pg   MCHC 31.8  31.8 - 35.4 g/dL   RDW, POC 15.0     Platelet Count, POC 241  142 - 424 K/uL   MPV 9.3  0 - 99.8 fL        Assessment & Plan:  Solitary nodule of right lobe of thyroid - Plan: TSH, Thyroid Peroxidase Antibody, T4, Free, Thyroid Stimulating Immunoglobulin, POCT CBC  Painful thyroid - Plan: US Soft Tissue Head/Neck  Pt has tender new thyroid nodule and it is concerning for thyroiditis even with a normal pulse.  Will check lab work and [place].  Pt is unable to tolerate NSAIDs due to his Crohn's and I am concerned that putting him on prednisone before his thyroid US might change the results.  Pt is in  agreement to watch and wait. Once we get his results we can make a better decisions on the next step.  Nausea - due to Crohns and his GI forgot to refill - Plan: ondansetron (ZOFRAN-ODT) 4 MG disintegrating tablet   Pt was seen and examined by Dr Linna Darner.

## 2012-10-05 NOTE — Progress Notes (Signed)
Patient examined with Horris Latino PAC.  Very large right thyroid nodule, tender.  Left feels a little large also.  Discussed diagnosis and treatment.

## 2012-10-06 NOTE — Progress Notes (Signed)
Quick Note:  Drug levels ok Will see him at follow-up ______

## 2012-10-07 LAB — THYROID PEROXIDASE ANTIBODY: Thyroperoxidase Ab SerPl-aCnc: <10 IU/mL (ref <35.0)

## 2012-10-07 NOTE — Progress Notes (Signed)
Quick Note:  OK Will discuss further at f/u ?domperidone Continue diet modifications  ______

## 2012-10-08 ENCOUNTER — Ambulatory Visit
Admission: RE | Admit: 2012-10-08 | Discharge: 2012-10-08 | Disposition: A | Discharge status: Home / Self Care | Payer: 59 | Source: Ambulatory Visit | Attending: Physician Assistant | Admitting: Physician Assistant

## 2012-10-08 DIAGNOSIS — E0789 Other specified disorders of thyroid: Secondary | ICD-10-CM

## 2012-10-09 ENCOUNTER — Other Ambulatory Visit: Payer: Self-pay | Admitting: Physician Assistant

## 2012-10-09 DIAGNOSIS — E079 Disorder of thyroid, unspecified: Secondary | ICD-10-CM

## 2012-10-10 LAB — THYROID STIMULATING IMMUNOGLOBULIN: TSI: 103 % baseline (ref <140)

## 2012-10-15 ENCOUNTER — Ambulatory Visit
Admission: RE | Admit: 2012-10-15 | Discharge: 2012-10-15 | Disposition: A | Discharge status: Home / Self Care | Payer: 59 | Source: Ambulatory Visit | Attending: Physician Assistant | Admitting: Physician Assistant

## 2012-10-15 ENCOUNTER — Other Ambulatory Visit (HOSPITAL_COMMUNITY)
Admission: RE | Admit: 2012-10-15 | Discharge: 2012-10-15 | Disposition: A | Discharge status: Home / Self Care | Payer: 59 | Source: Ambulatory Visit | Attending: Diagnostic Radiology | Admitting: Diagnostic Radiology

## 2012-10-15 DIAGNOSIS — E049 Nontoxic goiter, unspecified: Secondary | ICD-10-CM | POA: Insufficient documentation

## 2012-10-15 DIAGNOSIS — E079 Disorder of thyroid, unspecified: Secondary | ICD-10-CM

## 2012-10-22 ENCOUNTER — Other Ambulatory Visit: Payer: Self-pay | Admitting: Physician Assistant

## 2012-10-22 DIAGNOSIS — R9389 Abnormal findings on diagnostic imaging of other specified body structures: Secondary | ICD-10-CM

## 2012-10-28 ENCOUNTER — Ambulatory Visit (INDEPENDENT_AMBULATORY_CARE_PROVIDER_SITE_OTHER): Payer: 59 | Admitting: Surgery

## 2012-10-28 ENCOUNTER — Encounter (INDEPENDENT_AMBULATORY_CARE_PROVIDER_SITE_OTHER): Payer: Self-pay | Admitting: Surgery

## 2012-10-28 VITALS — BP 160/90 | HR 53 | Temp 97.0°F | Resp 18 | Ht 73.0 in | Wt 318.8 lb

## 2012-10-28 DIAGNOSIS — E041 Nontoxic single thyroid nodule: Secondary | ICD-10-CM

## 2012-10-28 LAB — T4: T4, Total: 7.9 ug/dL (ref 5.0–12.5)

## 2012-10-28 NOTE — Progress Notes (Signed)
Patient ID: Craig Guzman., male   DOB: 1970/10/20, 42 y.o.   MRN: [id number]  Chief Complaint  Patient presents with  . New Evaluation    eval abn thyroid    HPI Craig Guzman. is a 42 y.o. male.  Patient sent request of Dr.Auvbere 4 right thyroid nodule. He had some tenderness in his thyroid gland. Ultrasound showed a 3.5 cm cystic lesion right thyroid lobe and multiple other sub centimeter lesions. The 3.5 centimeters mass was aspirated with benign findings. The cyst appeared to resolve. He is start therapy for his Crohn's disease and he is sent in consultation for this cyst. No history of hyper or hypothyroidism. Denies any significant neck pain and has had minimal hoarseness yesterday but otherwise has felt well. HPI  Past Medical History  Diagnosis Date  . GERD (gastroesophageal reflux disease)   . HTN (hypertension)   . Gastric ulcer     antral  . Biliary dyskinesia   . Anxiety   . Obesity   . History of migraine headaches   . Allergy     trees/ grasses/ animals/dust/mold  . Crohn's disease     Stomach, terminal ileum, cecum  . Kidney stone 09/20/2012    Past Surgical History  Procedure Laterality Date  . Cholecystectomy  2011    [place]  . Vasectomy      bilateral w/lysis of penile adhesions  . Esophagogastroduodenoscopy  05/04/11, 07/26/11    granulomatous gastritis - Crohn's  . Multiple tooth extractions  2005  . Colonoscopy  07/26/11    Crohn's colitis, ileitis  . Shoulder surgery      right  . Joint replacement      Family History  Problem Relation Age of Onset  . Colon polyps Father   . Diabetes Father   . Hypertension Father   . Colon cancer Neg Hx   . Rectal cancer Neg Hx   . Stomach cancer Neg Hx   . Hypertension Mother   . Asthma Mother   . Heart disease Maternal Grandmother     Social History History  Substance Use Topics  . Smoking status: Current Every Day Smoker -- 1.00 packs/day for 10 years    Types: Cigarettes  . Smokeless  tobacco: Former Systems developer     Comment: Counseling sheet given in exam room   . Alcohol Use: No    Allergies  Allergen Reactions  . Zithromax (Azithromycin) Hypertension    Current Outpatient Prescriptions  Medication Sig Dispense Refill  . cyclobenzaprine (FLEXERIL) 10 MG tablet Take 10 mg by mouth 3 (three) times daily as needed.      Marland Kitchen DEXILANT 60 MG capsule TAKE ONE CAPSULE BY MOUTH EVERY DAY RECHECK BEFORE OUT  90 capsule  0  . lisinopril-hydrochlorothiazide (PRINZIDE,ZESTORETIC) 20-12.5 MG per tablet Take 2 tablets by mouth daily.  180 tablet  1  . mercaptopurine (PURINETHOL) 50 MG tablet Take 3 tablets (150 mg total) by mouth daily. Give on an empty stomach 1 hour before or 2 hours after meals. Caution: Chemotherapy.  90 tablet  3  . topiramate (TOPAMAX) 25 MG tablet Take 75 mg by mouth at bedtime.       . verapamil (COVERA HS) 180 MG (CO) 24 hr tablet Take 1 tablet (180 mg total) by mouth 2 (two) times daily.  180 tablet  1  . ondansetron (ZOFRAN-ODT) 4 MG disintegrating tablet Take 1 tablet (4 mg total) by mouth every 8 (eight) hours as needed for nausea. As needed  20 tablet  0   No current facility-administered medications for this visit.    Review of Systems Review of Systems  Constitutional: Negative.   HENT: Negative.   Eyes: Negative.   Respiratory: Negative.   Cardiovascular: Negative.   Gastrointestinal: Positive for abdominal pain and blood in stool.  Endocrine: Negative.   Genitourinary: Negative.   Musculoskeletal: Negative.   Skin: Negative.   Neurological: Negative.   Hematological: Negative.   Psychiatric/Behavioral: Negative.     Blood pressure 160/90, pulse 53, temperature 97 F (36.1 C), temperature source Temporal, resp. rate 18, height 6' 1"  (1.854 m), weight 318 lb 12.8 oz (144.607 kg).  Physical Exam Physical Exam  Constitutional: He is oriented to person, place, and time. He appears well-developed and well-nourished.  HENT:  Head: Normocephalic  and atraumatic.  Eyes: EOM are normal. Pupils are equal, round, and reactive to light.  Neck: Trachea normal, normal range of motion and phonation normal. Neck supple. No tracheal tenderness present. No tracheal deviation present. Thyromegaly present. No mass present.  Cardiovascular: Normal rate.   Pulmonary/Chest: Effort normal.  Abdominal: Soft.  Lymphadenopathy:    He has no cervical adenopathy.  Neurological: He is alert and oriented to person, place, and time.  Skin: Skin is warm and dry.  Psychiatric: He has a normal mood and affect. His behavior is normal. Judgment and thought content normal.    Data Reviewed  Clinical Data : tender thyroid mass.  THYROID ULTRASOUND  Technique: Ultrasound examination of the thyroid gland and adjacent  soft tissues was performed.  Comparison: None.  Findings:  Right thyroid lobe: 35 x 38 x 59 mm, homogeneous background  parenchyma  Left thyroid lobe: 18 x 23 x 47 mm  Isthmus: 8.1 mm in thickness  Focal nodules: 27 x 31 x 35 mm complex mostly cystic, inferior  right  At least four additional small hypoechoic / cystic lesions  bilaterally all less than 7 mm maximum diameter.  Lymphadenopathy: None visualized.  IMPRESSION:  1. 3.5 cm dominant right thyroid lesion. Findings meet consensus  criteria for biopsy. Ultrasound-guided fine needle aspiration  should be considered, as per the consensus statement: Management of  Thyroid Nodules Detected at [place]: Society of Radiologists in  [place]. Radiology 2005; 237:794-  800.  Original Report Authenticated By: D. 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 This appears to be a benign colloid nodule with no signs of atypia. Risk of malignancy is under 5%. Recommend close followup for now and may begin therapy for Crohn's. Return to clinic 6 months for  ultrasound. Will check TSH and T4 levels. Discussed with patient and family today.       [name] A. 10/28/2012, 12:35 PM

## 2012-10-28 NOTE — Patient Instructions (Signed)
Return in 6 months. Will check some blood work.

## 2012-11-04 ENCOUNTER — Ambulatory Visit (INDEPENDENT_AMBULATORY_CARE_PROVIDER_SITE_OTHER): Payer: 59 | Admitting: Internal Medicine

## 2012-11-04 ENCOUNTER — Other Ambulatory Visit: Payer: 59

## 2012-11-04 ENCOUNTER — Encounter: Payer: Self-pay | Admitting: Internal Medicine

## 2012-11-04 ENCOUNTER — Telehealth: Payer: Self-pay | Admitting: Internal Medicine

## 2012-11-04 VITALS — BP 144/90 | HR 84 | Ht 72.05 in | Wt 315.0 lb

## 2012-11-04 DIAGNOSIS — K5090 Crohn's disease, unspecified, without complications: Secondary | ICD-10-CM

## 2012-11-04 DIAGNOSIS — K3184 Gastroparesis: Secondary | ICD-10-CM

## 2012-11-04 MED ORDER — AMBULATORY NON FORMULARY MEDICATION: 10.0000 mg | Freq: Three times a day (TID) | Status: DC

## 2012-11-04 NOTE — Telephone Encounter (Signed)
Patient reports that he is having epigastric pain.  He reports that he notes he has terrible pain in his stomach if he doesn't get enough sleep.  He has been taking his Dexilant.  He will come in today and see [name] at 4:00

## 2012-11-04 NOTE — Progress Notes (Signed)
  Subjective:    Patient ID: Craig Guzman, male    DOB: June 01, 1971, 42 y.o.   MRN: [id number]  HPI The patient is here for followup. When last seen he was having upper abdominal pain, nausea and vomiting intermittently. He was modified his diet to help with that somewhat. He has been missing quite a bit of work which is a problem for him. If he misses more time, he did lose his job. FMLA paperwork will be filled out soon.  He tried metoclopramide, we newly had some problems with that in the past, see if that would help that that caused quite a bit of cramping he said he couldn't tolerate it. His 6-TG levels were therapeutic. He continues to have some diarrhea and upper abdominal cramps and intermittent nausea and vomiting. Things are really about the same.  Medications, allergies, past medical history, past surgical history, family history and social history are reviewed and updated in the EMR.  Review of Systems He had a thyroid nodule - it was aspirated and was a benign colloid nodule.    Objective:   Physical Exam General:  NAD Eyes:   anicteric Abdomen:  soft and  Mildly tender upper abdomen, BS+ Ext:   no edema    Assessment & Plan:   1. Crohn's disease of stomach and colon   2. Gastroparesis    1. He is about the same - could not tolerate the metaclopramide due to cramps 2. Time to check IBD serology and will also try domperidone 10 mg tid 3. If this not helpful then biologic Tx seems like next step 4. Emmi biologic Tx patient education tool to be viewed

## 2012-11-04 NOTE — Patient Instructions (Addendum)
Your physician has requested that you go to the basement for the following lab work before leaving today: IBD expanded panel  Today we have faxed the rx for Domperidone to [facility], they will mail this to you.  Please go to [email or web address] and view a short video on Crohn's disease and treatment options.  Thank you for choosing me and [facility].  Gatha Mayer, M.D., [facility]   Mailed patient emmi code.

## 2012-11-06 LAB — IBD EXPANDED PANEL
ALCA: 4 units (ref 0–60)
AMCA: 36 units (ref 0–100)
Atypical pANCA: NEGATIVE

## 2012-11-11 ENCOUNTER — Ambulatory Visit: Payer: 59 | Admitting: Internal Medicine

## 2012-11-11 NOTE — Progress Notes (Signed)
Quick Note:  The IBD panel is not consistent with Crohn's  We do know he has an inflammatory process in the stomach and colon, though  At this point would be less likely to use Remcade or other biologics Would like to see how he does with domperidone - should be getting this mailed to him soon  Please check that he has heard from pharmacy and set up REV in about 4-6 weeks ______

## 2012-11-12 ENCOUNTER — Telehealth: Payer: Self-pay

## 2012-11-12 NOTE — Telephone Encounter (Signed)
Message copied by Marlon Pel on Tue Nov 12, 2012 10:23 AM ------      Message from: Silvano Rusk E      Created: Mon Nov 11, 2012  8:11 PM       The IBD panel is not consistent with Crohn's       We do know he has an inflammatory process in the stomach and colon, though            At this point would be less likely to use Remcade or other biologics      Would like to see how he does with domperidone - should be getting this mailed to him soon       Please check that he has heard from pharmacy and set up REV in about 4-6 weeks ------

## 2012-11-12 NOTE — Telephone Encounter (Signed)
Patient aware He is scheduled for 12/24/12 11:30 He is provided the number to [facility] [phone] so they can verify his demographics for domperidone

## 2012-11-26 ENCOUNTER — Ambulatory Visit (INDEPENDENT_AMBULATORY_CARE_PROVIDER_SITE_OTHER): Payer: 59 | Admitting: Family Medicine

## 2012-11-26 VITALS — BP 154/108 | HR 96 | Temp 98.4°F | Resp 20 | Ht 72.0 in | Wt 309.8 lb

## 2012-11-26 DIAGNOSIS — G43901 Migraine, unspecified, not intractable, with status migrainosus: Secondary | ICD-10-CM

## 2012-11-26 DIAGNOSIS — I10 Essential (primary) hypertension: Secondary | ICD-10-CM | POA: Insufficient documentation

## 2012-11-26 DIAGNOSIS — M549 Dorsalgia, unspecified: Secondary | ICD-10-CM

## 2012-11-26 LAB — POCT URINALYSIS DIPSTICK
Blood, UA: NEGATIVE
Glucose, UA: NEGATIVE
Ketones, UA: 15
Leukocytes, UA: NEGATIVE
Nitrite, UA: NEGATIVE
Protein, UA: 30
Spec Grav, UA: 1.025
Urobilinogen, UA: 1
pH, UA: 6

## 2012-11-26 MED ORDER — HYDROCODONE-ACETAMINOPHEN 5-325 MG PO TABS: 1.0000 | ORAL_TABLET | Freq: Four times a day (QID) | ORAL | Status: DC | PRN

## 2012-11-26 MED ORDER — KETOROLAC TROMETHAMINE 60 MG/2ML IM SOLN
60.0000 mg | Freq: Once | INTRAMUSCULAR | Status: AC
  Administered 2012-11-26: 60 mg via INTRAMUSCULAR

## 2012-11-26 MED ORDER — CYCLOBENZAPRINE HCL 10 MG PO TABS: 10.0000 mg | ORAL_TABLET | Freq: Three times a day (TID) | ORAL | Status: DC | PRN

## 2012-11-26 MED ORDER — TOPIRAMATE 25 MG PO TABS: 75.0000 mg | ORAL_TABLET | Freq: Every day | ORAL | Status: DC

## 2012-11-26 NOTE — Progress Notes (Signed)
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  He has taken flexeril, hydrocodone, and oxycodone without much relief.  He did vomit Sunday night.  He did have scotoma yesterday  He missed work today.Marland Kitchen  He states the headae is bifrontal and bitemporal radiating back to occiput.  The headache is nonpositional, but worse when he sits down and stops moving.  No fever, head injury  He has had a stiffness in his back for three weeks.  Objective: alert, seen with wife Mental status:  Normal Neck:  Supple Fundi: Neuro:  Intact cranial nerves, gait Results for orders placed in visit on 11/26/12  POCT URINALYSIS DIPSTICK      Result Value Range   Color, UA amber     Clarity, UA clear     Glucose, UA neg     Bilirubin, UA small     Ketones, UA 15     Spec Grav, UA 1.025     Blood, UA neg     pH, UA 6.0     Protein, UA 30     Urobilinogen, UA 1.0     Nitrite, UA neg     Leukocytes, UA Negative      Assessment:  Status migrainous, back pain  Plan:  Restart the  topamax Hydrocodone tonight for pain Note for work  [employer]

## 2012-11-26 NOTE — Patient Instructions (Addendum)
Migraine Headache A migraine headache is an intense, throbbing pain on one or both sides of your head. A migraine can last for 30 minutes to several hours. CAUSES  The exact cause of a migraine headache is not always known. However, a migraine may be caused when nerves in the brain become irritated and release chemicals that cause inflammation. This causes pain. SYMPTOMS  Pain on one or both sides of your head.  Pulsating or throbbing pain.  Severe pain that prevents daily activities.  Pain that is aggravated by any physical activity.  Nausea, vomiting, or both.  Dizziness.  Pain with exposure to bright lights, loud noises, or activity.  General sensitivity to bright lights, loud noises, or smells. Before you get a migraine, you may get warning signs that a migraine is coming (aura). An aura may include:  Seeing flashing lights.  Seeing bright spots, halos, or zig-zag lines.  Having tunnel vision or blurred vision.  Having feelings of numbness or tingling.  Having trouble talking.  Having muscle weakness. MIGRAINE TRIGGERS  Alcohol.  Smoking.  Stress.  Menstruation.  Aged cheeses.  Foods or drinks that contain nitrates, glutamate, aspartame, or tyramine.  Lack of sleep.  Chocolate.  Caffeine.  Hunger.  Physical exertion.  Fatigue.  Medicines used to treat chest pain (nitroglycerine), birth control pills, estrogen, and some blood pressure medicines. DIAGNOSIS  A migraine headache is often diagnosed based on:  Symptoms.  Physical examination.  A CT scan or MRI of your head. TREATMENT Medicines may be given for pain and nausea. Medicines can also be given to help prevent recurrent migraines.  HOME CARE INSTRUCTIONS  Only take over-the-counter or prescription medicines for pain or discomfort as directed by your caregiver. The use of long-term narcotics is not recommended.  Lie down in a dark, quiet room when you have a migraine.  Keep a journal  to find out what may trigger your migraine headaches. For example, write down:  What you eat and drink.  How much sleep you get.  Any change to your diet or medicines.  Limit alcohol consumption.  Quit smoking if you smoke.  Get 7 to 9 hours of sleep, or as recommended by your caregiver.  Limit stress.  Keep lights dim if bright lights bother you and make your migraines worse. SEEK IMMEDIATE MEDICAL CARE IF:   Your migraine becomes severe.  You have a fever.  You have a stiff neck.  You have vision loss.  You have muscular weakness or loss of muscle control.  You start losing your balance or have trouble walking.  You feel faint or pass out.  You have severe symptoms that are different from your first symptoms. MAKE SURE YOU:   Understand these instructions.  Will watch your condition.  Will get help right away if you are not doing well or get worse. Document Released: 06/19/2005 Document Revised: 09/11/2011 Document Reviewed: 06/09/2011 [facility] Patient Information 2014 [place], [place].

## 2012-12-24 ENCOUNTER — Other Ambulatory Visit (INDEPENDENT_AMBULATORY_CARE_PROVIDER_SITE_OTHER): Payer: 59

## 2012-12-24 ENCOUNTER — Ambulatory Visit (INDEPENDENT_AMBULATORY_CARE_PROVIDER_SITE_OTHER): Payer: 59 | Admitting: Internal Medicine

## 2012-12-24 ENCOUNTER — Encounter: Payer: Self-pay | Admitting: Internal Medicine

## 2012-12-24 VITALS — BP 142/94 | HR 78 | Ht 74.0 in | Wt 322.4 lb

## 2012-12-24 DIAGNOSIS — Z79899 Other long term (current) drug therapy: Secondary | ICD-10-CM

## 2012-12-24 DIAGNOSIS — K50919 Crohn's disease, unspecified, with unspecified complications: Secondary | ICD-10-CM

## 2012-12-24 DIAGNOSIS — K5090 Crohn's disease, unspecified, without complications: Secondary | ICD-10-CM

## 2012-12-24 DIAGNOSIS — K3184 Gastroparesis: Secondary | ICD-10-CM

## 2012-12-24 LAB — COMPREHENSIVE METABOLIC PANEL
ALT: 30 U/L (ref 0–53)
AST: 24 U/L (ref 0–37)
Alkaline Phosphatase: 105 U/L (ref 39–117)
Sodium: 139 mEq/L (ref 135–145)
Total Bilirubin: 0.5 mg/dL (ref 0.3–1.2)
Total Protein: 7.6 g/dL (ref 6.0–8.3)

## 2012-12-24 LAB — CBC WITH DIFFERENTIAL/PLATELET
Basophils Absolute: 0.1 10*3/uL (ref 0.0–0.1)
HCT: 43.7 % (ref 39.0–52.0)
Lymphs Abs: 1.8 10*3/uL (ref 0.7–4.0)
MCV: 87 fl (ref 78.0–100.0)
Monocytes Absolute: 0.7 10*3/uL (ref 0.1–1.0)
Neutrophils Relative %: 45.4 % (ref 43.0–77.0)
Platelets: 208 10*3/uL (ref 150.0–400.0)
RDW: 15.7 % — ABNORMAL HIGH (ref 11.5–14.6)

## 2012-12-24 MED ORDER — DIPHENOXYLATE-ATROPINE 2.5-0.025 MG PO TABS: 1.0000 | ORAL_TABLET | Freq: Four times a day (QID) | ORAL | Status: AC | PRN

## 2012-12-24 MED ORDER — ONDANSETRON 8 MG PO TBDP: 8.0000 mg | ORAL_TABLET | Freq: Three times a day (TID) | ORAL | Status: DC | PRN

## 2012-12-24 NOTE — Patient Instructions (Addendum)
We have sent the following medications to your pharmacy for you to pick up at your convenience: CBC/diff, CMET, Hepatitis B surface Antigen  We have sent the following medications to your pharmacy for you to pick up at your convenience: Zofran  We have given you a printed rx for Lomotil.  Today you have been given a PPD, please come back on Thurs. To have it read within 48-72 hours.  No appointment needed.  We are giving you information on Biologic Therapy  To read.  Barb Merino, CGRN who is Dr. Celesta Aver nurse will be in touch about starting this.  We are giving you some web sites to view information on this as well.   I appreciate the opportunity to care for you.

## 2012-12-24 NOTE — Progress Notes (Signed)
  Subjective:    Patient ID: Craig Guzman, male    DOB: 09-19-1970, 42 y.o.   MRN: [id number]  HPI Patient is here with his wife. He was last seen on May 5. At that time and IBD panel was drawn, and that came back negative for inflammatory bowel disease or Crohn's disease. Domperidone was begun. He does think things are somewhat better. He is only able to take liquids most of the time before he works, if he eats solid foods he will bring this back up and vomited. He continues to struggle of migraines at times as well, and at the end of may need a refill and is migraine medication because he had run out. He will get nausea and vomiting with those as well. He has noted an increase in diarrhea and continues with persistent intermittent crampy abdominal pain. He is having 5 stools at night, they're loose and watery about every other night, as opposed to once or twice a week before. That may be worse since starting domperidone but he is not entirely clear. Overall he feels somewhat better on the domperidone but still is not doing well with the ongoing issues as described above and previous notes. Medications, allergies, past medical history, past surgical history, family history and social history are reviewed and updated in the EMR.   Review of Systems He has a neck and headache today, he thinks he might have slept drawn.    Objective:   Physical Exam General:  Mildly ill, obese no acute distress Eyes:   anicteric Lungs:  clear Heart:  S1S2 no rubs, murmurs or gallops Abdomen:  soft and nontender, BS+, there is no succussion splash   Data Reviewed:  As per history of present illness Also reviewed primary care notes from may     Assessment & Plan:   1. Crohn's disease, unspecified complication   2. Gastroparesis   3. Long-term use of immunosuppressant medication

## 2012-12-24 NOTE — Assessment & Plan Note (Signed)
He is intolerant of metoclopramide. It sounds like he is somewhat better on domperidone though only a little bit. I think  believe this is based on his chronic granulomatous inflammation thought to be Crohn's disease, the next that will be biologic therapy. Continue domperidone for now. Keep in mind that it could be causing some of the increase in diarrhea.

## 2012-12-24 NOTE — Assessment & Plan Note (Addendum)
He is due for liver tests today, he had a CBC 2 months ago but am going to get that as well prior to his biologic therapy. Otherwise no red flag here as best I can tell.

## 2012-12-24 NOTE — Assessment & Plan Note (Addendum)
I think overall he has been better since he's been on immune modulator, I have proven decreasing gastritis an endoscopy but he still has a significant number of symptoms. He continues with nausea and vomiting and difficulty tolerating solid foods, he has an increase in diarrhea. He is on a good dose of immunomodulators. Therapeutic levels in the past. The next that will be biologic therapy. I've explained the risks benefits and indications. I've also explained that the IBD profile was negative for IBD but that that's not perfect and we know he has a granulomatous disease. I offered tertiary referral. He and his wife have decided to stay the course year and we will go ahead and pursue biologic therapy. I reviewed the risks benefits and indications, a Crohn's and colitis Foundation handout was given again and have also asked him to review the cc FA video on biologic therapy available through [facility]. PPD administered today hepatitis B surface antigen checked as well, he is immune to hepatitis B as best I know but I being complete here. Lomotil generic refill #60. I hope this will not interfere with as gastric harasses symptoms but he has cramps and diarrhea at times as well.

## 2012-12-25 NOTE — Progress Notes (Signed)
Quick Note:  Labs ok - waiting on PPD to get started w/ biologic ______

## 2012-12-26 ENCOUNTER — Other Ambulatory Visit: Payer: Self-pay

## 2012-12-26 MED ORDER — ADALIMUMAB 40 MG/0.8ML ~~LOC~~ KIT: PACK | SUBCUTANEOUS | Status: DC

## 2012-12-30 ENCOUNTER — Telehealth: Payer: Self-pay | Admitting: Internal Medicine

## 2012-12-30 NOTE — Telephone Encounter (Signed)
His wife will contact the pharmacy and check on the status of the Humira rx.  They will call me for a discount card prior to picking it up.

## 2012-12-31 NOTE — Telephone Encounter (Signed)
I spoke with [facility] and they are processing the rx for Humira [phone]

## 2013-01-01 ENCOUNTER — Encounter: Payer: Self-pay | Admitting: Internal Medicine

## 2013-01-01 NOTE — Telephone Encounter (Signed)
Robin aware that I am waiting for prior auth to come from [place] and she is asked to come pick up talking pen and discount card.

## 2013-01-01 NOTE — Telephone Encounter (Signed)
Error

## 2013-01-02 ENCOUNTER — Other Ambulatory Visit: Payer: Self-pay | Admitting: Gastroenterology

## 2013-01-06 NOTE — Telephone Encounter (Signed)
Prior auth form sent to [facility]

## 2013-01-07 NOTE — Telephone Encounter (Signed)
Humira has been approved until 01/2015.  I have left a message for the patient and his wife to give [facility] the discount card information and when they have the Humira to call me to arrange teaching

## 2013-01-14 ENCOUNTER — Ambulatory Visit (INDEPENDENT_AMBULATORY_CARE_PROVIDER_SITE_OTHER): Payer: 59 | Admitting: Emergency Medicine

## 2013-01-14 VITALS — BP 140/102 | HR 86 | Temp 98.0°F | Resp 16 | Ht 74.0 in | Wt 324.0 lb

## 2013-01-14 DIAGNOSIS — G43109 Migraine with aura, not intractable, without status migrainosus: Secondary | ICD-10-CM

## 2013-01-14 DIAGNOSIS — G43901 Migraine, unspecified, not intractable, with status migrainosus: Secondary | ICD-10-CM

## 2013-01-14 MED ORDER — KETOROLAC TROMETHAMINE 60 MG/2ML IM SOLN
60.0000 mg | Freq: Once | INTRAMUSCULAR | Status: AC
  Administered 2013-01-14: 60 mg via INTRAMUSCULAR

## 2013-01-14 MED ORDER — HYDROCODONE-ACETAMINOPHEN 5-325 MG PO TABS: 1.0000 | ORAL_TABLET | Freq: Four times a day (QID) | ORAL | Status: DC | PRN

## 2013-01-14 MED ORDER — PROMETHAZINE HCL 25 MG/ML IJ SOLN
50.0000 mg | Freq: Once | INTRAMUSCULAR | Status: AC
  Administered 2013-01-14: 50 mg via INTRAMUSCULAR

## 2013-01-14 NOTE — Patient Instructions (Addendum)
Migraine Headache A migraine headache is an intense, throbbing pain on one or both sides of your head. A migraine can last for 30 minutes to several hours. CAUSES  The exact cause of a migraine headache is not always known. However, a migraine may be caused when nerves in the brain become irritated and release chemicals that cause inflammation. This causes pain. SYMPTOMS  Pain on one or both sides of your head.  Pulsating or throbbing pain.  Severe pain that prevents daily activities.  Pain that is aggravated by any physical activity.  Nausea, vomiting, or both.  Dizziness.  Pain with exposure to bright lights, loud noises, or activity.  General sensitivity to bright lights, loud noises, or smells. Before you get a migraine, you may get warning signs that a migraine is coming (aura). An aura may include:  Seeing flashing lights.  Seeing bright spots, halos, or zig-zag lines.  Having tunnel vision or blurred vision.  Having feelings of numbness or tingling.  Having trouble talking.  Having muscle weakness. MIGRAINE TRIGGERS  Alcohol.  Smoking.  Stress.  Menstruation.  Aged cheeses.  Foods or drinks that contain nitrates, glutamate, aspartame, or tyramine.  Lack of sleep.  Chocolate.  Caffeine.  Hunger.  Physical exertion.  Fatigue.  Medicines used to treat chest pain (nitroglycerine), birth control pills, estrogen, and some blood pressure medicines. DIAGNOSIS  A migraine headache is often diagnosed based on:  Symptoms.  Physical examination.  A CT scan or MRI of your head. TREATMENT Medicines may be given for pain and nausea. Medicines can also be given to help prevent recurrent migraines.  HOME CARE INSTRUCTIONS  Only take over-the-counter or prescription medicines for pain or discomfort as directed by your caregiver. The use of long-term narcotics is not recommended.  Lie down in a dark, quiet room when you have a migraine.  Keep a journal  to find out what may trigger your migraine headaches. For example, write down:  What you eat and drink.  How much sleep you get.  Any change to your diet or medicines.  Limit alcohol consumption.  Quit smoking if you smoke.  Get 7 to 9 hours of sleep, or as recommended by your caregiver.  Limit stress.  Keep lights dim if bright lights bother you and make your migraines worse. SEEK IMMEDIATE MEDICAL CARE IF:   Your migraine becomes severe.  You have a fever.  You have a stiff neck.  You have vision loss.  You have muscular weakness or loss of muscle control.  You start losing your balance or have trouble walking.  You feel faint or pass out.  You have severe symptoms that are different from your first symptoms. MAKE SURE YOU:   Understand these instructions.  Will watch your condition.  Will get help right away if you are not doing well or get worse. Document Released: 06/19/2005 Document Revised: 09/11/2011 Document Reviewed: 06/09/2011 [facility] Patient Information 2014 [place], [place].

## 2013-01-14 NOTE — Progress Notes (Signed)
[facility] [address], [place] [address] [phone]  Date:  01/14/2013   Name:  Adhvik Canady   DOB:  [birth year]   MRN:  [id number]  PCP:  [name], MD    Chief Complaint: Migraine   History of Present Illness:  Horrace Hanak is a 42 y.o. very pleasant male patient who presents with the following:  Says that his headache has been present since Sunday.  Nauseated and vomiting.  No relief with current medications.  Has missed several workdays.  No fever or chills. No wheezing or shortness of breath, antecedent illness or injury.  Pain is consistent with his usual headache.  Has photophobia.  Seen two weeks ago by Dr Joseph Art.  No improvement with over the counter medications or other home remedies. Denies other complaint or health concern today.   Patient Active Problem List   Diagnosis Date Noted  . Hypertension 11/26/2012  . Gastroparesis 12/08/2011  . Migraine headache 12/08/2011  . Long-term use of immunosuppressant medication 08/08/2011  . Constipation 06/07/2011  . Crohn's disease of stomach and colon 05/17/2011  . GERD (gastroesophageal reflux disease) 04/21/2011    Past Medical History  Diagnosis Date  . GERD (gastroesophageal reflux disease)   . HTN (hypertension)   . Gastric ulcer     antral  . Biliary dyskinesia   . Anxiety   . Obesity   . History of migraine headaches   . Allergy     trees/ grasses/ animals/dust/mold  . Crohn's disease     Stomach, terminal ileum, cecum  . Kidney stone 09/20/2012  . Colloid thyroid nodule     Past Surgical History  Procedure Laterality Date  . Cholecystectomy  2011    Rosenbower  . Vasectomy      bilateral w/lysis of penile adhesions  . Esophagogastroduodenoscopy  05/04/11, 07/26/11    granulomatous gastritis - Crohn's  . Multiple tooth extractions  2005  . Colonoscopy  07/26/11    Crohn's colitis, ileitis  . Shoulder surgery      right  . Joint replacement      History   Substance Use Topics  . Smoking status: Current Every Day Smoker -- 1.00 packs/day for 14 years    Types: Cigarettes  . Smokeless tobacco: Former Systems developer     Comment: Counseling sheet given in exam room   . Alcohol Use: No    Family History  Problem Relation Age of Onset  . Colon polyps Father   . Diabetes Father   . Hypertension Father   . Colon cancer Neg Hx   . Rectal cancer Neg Hx   . Stomach cancer Neg Hx   . Hypertension Mother   . Asthma Mother   . Heart disease Maternal Grandmother     Allergies  Allergen Reactions  . Zithromax (Azithromycin) Hypertension    Medication list has been reviewed and updated.  Current Outpatient Prescriptions on File Prior to Visit  Medication Sig Dispense Refill  . adalimumab (HUMIRA PEN STARTER) 40 MG/0.8ML injection Inject 160 mg sq on day 1, then 80 mg sq in 14 days  1 each  0  . AMBULATORY NON FORMULARY MEDICATION Take 10 mg by mouth 3 (three) times daily before meals. Medication Name: Domperidone  90 capsule  1  . cyclobenzaprine (FLEXERIL) 10 MG tablet Take 1 tablet (10 mg total) by mouth 3 (three) times daily as needed.  30 tablet  11  . DEXILANT 60 MG capsule TAKE  ONE CAPSULE BY MOUTH EVERY DAY RECHECK BEFORE OUT  90 capsule  0  . HYDROcodone-acetaminophen (NORCO) 5-325 MG per tablet Take 1 tablet by mouth every 6 (six) hours as needed for pain.  30 tablet  0  . lisinopril-hydrochlorothiazide (PRINZIDE,ZESTORETIC) 20-12.5 MG per tablet Take 2 tablets by mouth daily.  180 tablet  1  . mercaptopurine (PURINETHOL) 50 MG tablet Take 3 tablets (150 mg total) by mouth daily. Give on an empty stomach 1 hour before or 2 hours after meals. Caution: Chemotherapy.  90 tablet  3  . ondansetron (ZOFRAN-ODT) 8 MG disintegrating tablet Take 1 tablet (8 mg total) by mouth every 8 (eight) hours as needed for nausea.  30 tablet  3  . topiramate (TOPAMAX) 25 MG tablet Take 3 tablets (75 mg total) by mouth at bedtime.  90 tablet  11  . verapamil (COVERA  HS) 180 MG (CO) 24 hr tablet Take 1 tablet (180 mg total) by mouth 2 (two) times daily.  180 tablet  1  . adalimumab (HUMIRA PEN) 40 MG/0.8ML injection Inject 40 mg sq every 14 days, after the completion of starter kit  2 each  4   No current facility-administered medications on file prior to visit.    Review of Systems:  As per HPI, otherwise negative.    Physical Examination: Filed Vitals:   01/14/13 1501  BP: 140/102  Pulse: 86  Temp: 98 F (36.7 C)  Resp: 16   Filed Vitals:   01/14/13 1501  Height: 6' 2"  (1.88 m)  Weight: 324 lb (146.965 kg)   Body mass index is 41.58 kg/(m^2). Ideal Body Weight: Weight in (lb) to have BMI = 25: 194.3  GEN: morbidly obsese, ill appearing, Non-toxic, A & O x 3 HEENT: Atraumatic, Normocephalic. Neck supple. No masses, No LAD. Ears and Nose: No external deformity. CV: RRR, No M/G/R. No JVD. No thrill. No extra heart sounds. PULM: CTA B, no wheezes, crackles, rhonchi. No retractions. No resp. distress. No accessory muscle use. ABD: S, NT, ND, +BS. No rebound. No HSM. EXTR: No c/c/e NEURO Normal gait. PRRERLA EOMI CN 2-12 intact PSYCH: Normally interactive. Conversant. Not depressed or anxious appearing.  Calm demeanor.    Assessment and Plan: Migraine Dr Federico Flake refilled his norco Toradol 60 and phenergan 50 Follow up with headache clinic   Signed,  Ellison Carwin, MD

## 2013-01-21 ENCOUNTER — Telehealth: Payer: Self-pay | Admitting: Internal Medicine

## 2013-01-21 NOTE — Telephone Encounter (Signed)
Patient picked up Humira today.  They will come for teaching tomorrow at 9:30

## 2013-01-24 ENCOUNTER — Other Ambulatory Visit: Payer: Self-pay | Admitting: Family Medicine

## 2013-02-26 ENCOUNTER — Other Ambulatory Visit (INDEPENDENT_AMBULATORY_CARE_PROVIDER_SITE_OTHER): Payer: Self-pay

## 2013-02-26 DIAGNOSIS — E041 Nontoxic single thyroid nodule: Secondary | ICD-10-CM

## 2013-02-27 ENCOUNTER — Telehealth (INDEPENDENT_AMBULATORY_CARE_PROVIDER_SITE_OTHER): Payer: Self-pay | Admitting: General Surgery

## 2013-02-27 NOTE — Telephone Encounter (Signed)
Spoke with wife and explained that we have set up her husband for his LFT [place] head/neck at [facility] located at [place] on 03/28/13 at 1:00.  Explained that there is no prep but that Sharyn Lull, Dr. Josetta Huddle assistant, mailed some [facility] lab work to their house and that he would need to have those labs drawn the same day and in the same building.  The wife expressed understanding.

## 2013-03-20 ENCOUNTER — Other Ambulatory Visit: Payer: Self-pay

## 2013-03-20 DIAGNOSIS — I10 Essential (primary) hypertension: Secondary | ICD-10-CM

## 2013-03-20 DIAGNOSIS — G43901 Migraine, unspecified, not intractable, with status migrainosus: Secondary | ICD-10-CM

## 2013-03-20 MED ORDER — TOPIRAMATE 25 MG PO TABS: 75.0000 mg | ORAL_TABLET | Freq: Every day | ORAL | Status: DC

## 2013-03-28 ENCOUNTER — Ambulatory Visit
Admission: RE | Admit: 2013-03-28 | Discharge: 2013-03-28 | Disposition: A | Discharge status: Home / Self Care | Payer: 59 | Source: Ambulatory Visit | Attending: Surgery | Admitting: Surgery

## 2013-03-28 DIAGNOSIS — E041 Nontoxic single thyroid nodule: Secondary | ICD-10-CM

## 2013-04-03 ENCOUNTER — Telehealth: Payer: Self-pay | Admitting: Internal Medicine

## 2013-04-03 MED ORDER — ADALIMUMAB 40 MG/0.8ML ~~LOC~~ KIT: 40.0000 mg | PACK | Freq: Once | SUBCUTANEOUS | Status: DC

## 2013-04-03 NOTE — Telephone Encounter (Signed)
I spoke with the patient's wife  The pen failed when injecting.  She states that everything went as normal, but when they pulled the pen back all the medicine ran out.  She states she could see the needle mark on the skin, but he did not feel any burning with the injection.  I will provide a replacement pen out of our sample stock, [name] will come pick it up in the am. Humira will replace the pen to the office

## 2013-04-03 NOTE — Telephone Encounter (Signed)
Left message for patient to call back  

## 2013-04-04 ENCOUNTER — Ambulatory Visit (INDEPENDENT_AMBULATORY_CARE_PROVIDER_SITE_OTHER): Payer: 59 | Admitting: Surgery

## 2013-04-04 ENCOUNTER — Encounter (INDEPENDENT_AMBULATORY_CARE_PROVIDER_SITE_OTHER): Payer: Self-pay | Admitting: Surgery

## 2013-04-04 VITALS — BP 150/100 | HR 88 | Temp 98.2°F | Resp 14 | Ht 74.0 in | Wt 326.6 lb

## 2013-04-04 DIAGNOSIS — E041 Nontoxic single thyroid nodule: Secondary | ICD-10-CM

## 2013-04-04 NOTE — Progress Notes (Signed)
Patient ID: Craig Guzman, male   DOB: 10-26-1970, 42 y.o.   MRN: [id number]  Chief Complaint  Patient presents with  . Follow-up    LTFU 6 mo thyroid    HPI Craig Guzman is a 42 y.o. male.  Patient sent request of Dr.Auvbere for  right thyroid nodule. He had some tenderness in his thyroid gland. Ultrasound showed a 3.5 cm cystic lesion right thyroid lobe and multiple other sub centimeter lesions. The 3.5 centimeters mass was aspirated with benign findings. The cyst appeared to resolve. He is start therapy for his Crohn's disease and he is sent in consultation for this cyst. No history of hyper or hypothyroidism. Denies any significant neck pain and has had minimal hoarseness yesterday but otherwise has felt well. Pt here for 6 month follow up.  On Humara for Crohn's. HPI  Past Medical History  Diagnosis Date  . GERD (gastroesophageal reflux disease)   . HTN (hypertension)   . Gastric ulcer     antral  . Biliary dyskinesia   . Anxiety   . Obesity   . History of migraine headaches   . Allergy     trees/ grasses/ animals/dust/mold  . Crohn's disease     Stomach, terminal ileum, cecum  . Kidney stone 09/20/2012  . Colloid thyroid nodule     Past Surgical History  Procedure Laterality Date  . Cholecystectomy  2011    Rosenbower  . Vasectomy      bilateral w/lysis of penile adhesions  . Esophagogastroduodenoscopy  05/04/11, 07/26/11    granulomatous gastritis - Crohn's  . Multiple tooth extractions  2005  . Colonoscopy  07/26/11    Crohn's colitis, ileitis  . Shoulder surgery      right  . Joint replacement      Family History  Problem Relation Age of Onset  . Colon polyps Father   . Diabetes Father   . Hypertension Father   . Colon cancer Neg Hx   . Rectal cancer Neg Hx   . Stomach cancer Neg Hx   . Hypertension Mother   . Asthma Mother   . Heart disease Maternal Grandmother     Social History History  Substance Use Topics  . Smoking status: Current Every Day  Smoker -- 1.00 packs/day for 14 years    Types: Cigarettes  . Smokeless tobacco: Former Systems developer     Comment: Counseling sheet given in exam room   . Alcohol Use: No    Allergies  Allergen Reactions  . Zithromax [Azithromycin] Hypertension    Current Outpatient Prescriptions  Medication Sig Dispense Refill  . adalimumab (HUMIRA PEN STARTER) 40 MG/0.8ML injection Inject 160 mg sq on day 1, then 80 mg sq in 14 days  1 each  0  . adalimumab (HUMIRA PEN) 40 MG/0.8ML injection Inject 40 mg sq every 14 days, after the completion of starter kit  2 each  4  . adalimumab (HUMIRA PEN) 40 MG/0.8ML injection Inject 0.8 mLs (40 mg total) into the skin once.  1 each  0  . cyclobenzaprine (FLEXERIL) 10 MG tablet Take 1 tablet (10 mg total) by mouth 3 (three) times daily as needed.  30 tablet  11  . DEXILANT 60 MG capsule TAKE ONE CAPSULE BY MOUTH EVERY DAY RECHECK BEFORE OUT  90 capsule  0  . HYDROcodone-acetaminophen (NORCO) 5-325 MG per tablet Take 1 tablet by mouth every 6 (six) hours as needed for pain.  30 tablet  0  . lisinopril-hydrochlorothiazide (  PRINZIDE,ZESTORETIC) 20-12.5 MG per tablet TAKE 2 TABLETS BY MOUTH DAILY.  180 tablet  0  . mercaptopurine (PURINETHOL) 50 MG tablet Take 3 tablets (150 mg total) by mouth daily. Give on an empty stomach 1 hour before or 2 hours after meals. Caution: Chemotherapy.  90 tablet  3  . ondansetron (ZOFRAN-ODT) 8 MG disintegrating tablet Take 1 tablet (8 mg total) by mouth every 8 (eight) hours as needed for nausea.  30 tablet  3  . topiramate (TOPAMAX) 25 MG tablet Take 3 tablets (75 mg total) by mouth at bedtime.  270 tablet  2  . verapamil (COVERA HS) 180 MG (CO) 24 hr tablet Take 1 tablet (180 mg total) by mouth 2 (two) times daily.  180 tablet  1   No current facility-administered medications for this visit.    Review of Systems Review of Systems  Constitutional: Negative.   HENT: Negative.   Eyes: Negative.   Respiratory: Negative.   Cardiovascular:  Negative.   Gastrointestinal: Positive for abdominal pain and blood in stool.  Endocrine: Negative.   Genitourinary: Negative.   Musculoskeletal: Negative.   Skin: Negative.   Neurological: Negative.   Hematological: Negative.   Psychiatric/Behavioral: Negative.     Blood pressure 150/100, pulse 88, temperature 98.2 F (36.8 C), temperature source Temporal, resp. rate 14, height 6' 2"  (1.88 m), weight 326 lb 9.6 oz (148.145 kg).  Physical Exam Physical Exam  Constitutional: He is oriented to person, place, and time. He appears well-developed and well-nourished.  HENT:  Head: Normocephalic and atraumatic.  Eyes: EOM are normal. Pupils are equal, round, and reactive to light.  Neck: Trachea normal, normal range of motion and phonation normal. Neck supple. No tracheal tenderness present. No tracheal deviation present. Thyromegaly present. No mass present.  Cardiovascular: Normal rate.   Pulmonary/Chest: Effort normal.  Abdominal: Soft.  Lymphadenopathy:    He has no cervical adenopathy.  Neurological: He is alert and oriented to person, place, and time.  Skin: Skin is warm and dry.  Psychiatric: He has a normal mood and affect. His behavior is normal. Judgment and thought content normal.    Data Reviewed CLINICAL DATA: Followup thyroid nodule.  EXAM:  THYROID ULTRASOUND  TECHNIQUE:  Ultrasound examination of the thyroid gland and adjacent soft  tissues was performed.  COMPARISON: 10/08/2012.  FINDINGS:  Right thyroid lobe  Measurements: 5.1 cm x 1.7 cm x 2.2 cm. The gland demonstrates a  diffusely heterogeneous echotexture. Small hypoechoic nodule in the  posterior midpole measures 9.3 mm in greatest dimension. It contains  central calcification. There is a 4 mm cystic nodule along the upper  pole.  Left thyroid lobe  Measurements: 4.8 cm x 1.7 cm x 2.5 cm. The gland is diffusely  heterogeneous in its echotexture. There are several small nodules.  These are all  hypoechoic or cystic. There is a 5.7 mm nodule that is  more complex with small internal calcifications. This lies in the  posterior midpole. A 5.6 mm cystic nodule lies in the lower pole.  Isthmus  Thickness: 9 mm. 7.6 mm heterogeneous hypoechoic nodule lies along  the right aspect of the isthmus.  Lymphadenopathy  There are scattered prominent lymph nodes, the largest on the left  measuring 9 mm in short axis.  IMPRESSION:  1. The large complex cystic nodule seen in the right lobe on the  prior study is no longer evident. It was biopsied on 10/15/12. The  hypoechoic nodule with small calcifications which now lies  in the  midpole may reflect the residual of that larger previous nodule.  2. Multiple small nodules are evident as described above. No nodule  is larger than 9 mm in greatest dimension. A nodule along the right  isthmus was not evident on the prior study.  3. Prominent to slightly enlarged lymph nodes, the largest measuring  9 mm in short axis. Only 1 small lymph node is present on the prior  exam, suggesting that the lymph nodes may have increased in the  interval.  4. Despite the small size of the thyroid nodules, in presence of  prominent to slightly enlarged lymph nodes indicates continued close  surveillance.  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  Return 6 months.  Smoking cessation discussed.  Mild LA probably secondary to Humira. Will follow.       Bradee Common A. 04/04/2013, 12:12 PM

## 2013-04-04 NOTE — Patient Instructions (Signed)
Return 6 months.  U/S looks good.  Stop smoking.

## 2013-04-05 LAB — TSH: TSH: 1.295 u[IU]/mL (ref 0.350–4.500)

## 2013-04-05 LAB — T4: T4, Total: 10.4 ug/dL (ref 5.0–12.5)

## 2013-04-24 ENCOUNTER — Telehealth: Payer: Self-pay | Admitting: Internal Medicine

## 2013-04-24 NOTE — Telephone Encounter (Signed)
I have left a message for Craig Guzman or Craig Guzman that I have scheduled an appt for tomorrow at 11:00.  They are asked to call back and confirm the appt.

## 2013-04-25 ENCOUNTER — Ambulatory Visit: Payer: 59 | Admitting: Internal Medicine

## 2013-04-25 NOTE — Telephone Encounter (Signed)
Patient did not return call or come for today's appt.  I have canceled the appt (not a no show).  He is currently scheduled for 06/03/13.  I will await a return call from the patient

## 2013-04-29 NOTE — Telephone Encounter (Signed)
Left message for patient to call back  

## 2013-04-29 NOTE — Telephone Encounter (Signed)
Patient is rescheduled to 05/01/13 10:30 with [name]

## 2013-04-29 NOTE — Telephone Encounter (Signed)
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

## 2013-05-01 ENCOUNTER — Ambulatory Visit (INDEPENDENT_AMBULATORY_CARE_PROVIDER_SITE_OTHER): Payer: 59 | Admitting: Internal Medicine

## 2013-05-01 ENCOUNTER — Encounter: Payer: Self-pay | Admitting: Internal Medicine

## 2013-05-01 ENCOUNTER — Other Ambulatory Visit (INDEPENDENT_AMBULATORY_CARE_PROVIDER_SITE_OTHER): Payer: 59

## 2013-05-01 VITALS — BP 134/100 | HR 84 | Ht 71.75 in | Wt 318.5 lb

## 2013-05-01 DIAGNOSIS — Z79899 Other long term (current) drug therapy: Secondary | ICD-10-CM

## 2013-05-01 DIAGNOSIS — K3184 Gastroparesis: Secondary | ICD-10-CM

## 2013-05-01 DIAGNOSIS — Z23 Encounter for immunization: Secondary | ICD-10-CM

## 2013-05-01 DIAGNOSIS — K5090 Crohn's disease, unspecified, without complications: Secondary | ICD-10-CM

## 2013-05-01 LAB — CBC WITH DIFFERENTIAL/PLATELET
Basophils Absolute: 0.1 10*3/uL (ref 0.0–0.1)
Basophils Relative: 1.2 % (ref 0.0–3.0)
Hemoglobin: 16.2 g/dL (ref 13.0–17.0)
Lymphocytes Relative: 40.8 % (ref 12.0–46.0)
Monocytes Relative: 12.9 % — ABNORMAL HIGH (ref 3.0–12.0)
Neutro Abs: 2.5 10*3/uL (ref 1.4–7.7)
RBC: 5.57 Mil/uL (ref 4.22–5.81)
RDW: 14.4 % (ref 11.5–14.6)
WBC: 6.2 10*3/uL (ref 4.5–10.5)

## 2013-05-01 LAB — HEPATIC FUNCTION PANEL
AST: 29 U/L (ref 0–37)
Albumin: 4.3 g/dL (ref 3.5–5.2)
Alkaline Phosphatase: 89 U/L (ref 39–117)

## 2013-05-01 NOTE — Assessment & Plan Note (Addendum)
Better on Humira but still has abdominal pain and nausea vomiting though less- to continue this and azathioprine RTC 6 months

## 2013-05-01 NOTE — Assessment & Plan Note (Addendum)
Try 5 mg metaclopramide and diet modification Refer to Dr. Derrill Kay at [facility] for additional thoughts on this complicated situation Gastroparesis diet again Add Boost/Ensure

## 2013-05-01 NOTE — Assessment & Plan Note (Signed)
Flu vaccine today CBC and LFT's today

## 2013-05-01 NOTE — Patient Instructions (Addendum)
You have been given a separate informational sheet regarding your tobacco use, the importance of quitting and local resources to help you quit.  Your physician has requested that you go to the basement for the following lab work before leaving today: CBC/diff, LFT's   Today you have been given a flu vaccine.  We will be in touch about an appointment with Dr. Derrill Kay at [facility] in [place].  Today we are providing you with a Gastroparesis diet handout to read and follow.  I appreciate the opportunity to care for you.

## 2013-05-01 NOTE — Progress Notes (Signed)
Subjective:    Patient ID: Craig Guzman, male    DOB: [birth year], 42 y.o.   MRN: [id number]  HPI Area returns for followup of his Crohn's disease of the upper and lower GI tract as well as gastroparesis. He still struggling vomiting if she eats solid food, but typically going to work so he will avoid eating solid food before and during work other than eating small amounts of peanut butter crackers and drinking fluids. Ondansetron has not helped his nausea and vomiting. He is been on Humira since July and believes that he is improved overall with less pain and less attacks of nausea and vomiting. He has not had fever or chills. He reports that he try the domperidone and that did not seem to make a difference. He is asking about trying a lower dose of metoclopramide, and trying 5 mg dose to see if that makes a difference without causing crampy abdominal pain like he had when he took 10 mg dose. When I had seen his wife last week, she indicated that he was under some pressure from his employer to try to quit work angle 1 disability which he is not interested in doing. Wt Readings from Last 3 Encounters:  05/01/13 318 lb 8 oz (144.471 kg)  04/04/13 326 lb 9.6 oz (148.145 kg)  01/14/13 324 lb (146.965 kg)   Allergies  Allergen Reactions  . Zithromax [Azithromycin] Hypertension   Outpatient Prescriptions Prior to Visit  Medication Sig Dispense Refill  . cyclobenzaprine (FLEXERIL) 10 MG tablet Take 1 tablet (10 mg total) by mouth 3 (three) times daily as needed.  30 tablet  11  . DEXILANT 60 MG capsule TAKE ONE CAPSULE BY MOUTH EVERY DAY RECHECK BEFORE OUT  90 capsule  0  . HYDROcodone-acetaminophen (NORCO) 5-325 MG per tablet Take 1 tablet by mouth every 6 (six) hours as needed for pain.  30 tablet  0  . lisinopril-hydrochlorothiazide (PRINZIDE,ZESTORETIC) 20-12.5 MG per tablet TAKE 2 TABLETS BY MOUTH DAILY.  180 tablet  0  . mercaptopurine (PURINETHOL) 50 MG tablet Take 3 tablets (150 mg total) by  mouth daily. Give on an empty stomach 1 hour before or 2 hours after meals. Caution: Chemotherapy.  90 tablet  3  . ondansetron (ZOFRAN-ODT) 8 MG disintegrating tablet Take 1 tablet (8 mg total) by mouth every 8 (eight) hours as needed for nausea.  30 tablet  3  . topiramate (TOPAMAX) 25 MG tablet Take 3 tablets (75 mg total) by mouth at bedtime.  270 tablet  2  . verapamil (COVERA HS) 180 MG (CO) 24 hr tablet Take 1 tablet (180 mg total) by mouth 2 (two) times daily.  180 tablet  1  . adalimumab (HUMIRA PEN STARTER) 40 MG/0.8ML injection Inject 160 mg sq on day 1, then 80 mg sq in 14 days  1 each  0  . adalimumab (HUMIRA PEN) 40 MG/0.8ML injection Inject 40 mg sq every 14 days, after the completion of starter kit  2 each  4  . adalimumab (HUMIRA PEN) 40 MG/0.8ML injection Inject 0.8 mLs (40 mg total) into the skin once.  1 each  0   No facility-administered medications prior to visit.   Past Medical History  Diagnosis Date  . GERD (gastroesophageal reflux disease)   . HTN (hypertension)   . Gastric ulcer     antral  . Biliary dyskinesia   . Anxiety   . Obesity   . History of migraine headaches   . Allergy  trees/ grasses/ animals/dust/mold  . Crohn's disease     Stomach, terminal ileum, cecum  . Kidney stone 09/20/2012  . Colloid thyroid nodule    Past Surgical History  Procedure Laterality Date  . Cholecystectomy  2011    Rosenbower  . Vasectomy      bilateral w/lysis of penile adhesions  . Esophagogastroduodenoscopy  05/04/11, 07/26/11    granulomatous gastritis - Crohn's  . Multiple tooth extractions  2005  . Colonoscopy  07/26/11    Crohn's colitis, ileitis  . Shoulder surgery      right  . Joint replacement     History   Social History  . Marital Status: Married    Spouse Name: N/A    Number of Children: 3  . Years of Education: N/A   Occupational History  . [employer]   .     Social History Main Topics  . Smoking status: Current Every Day Smoker -- 1.00  packs/day for 14 years    Types: Cigarettes  . Smokeless tobacco: Former Systems developer     Comment: Counseling sheet given in exam room   . Alcohol Use: No  . Drug Use: No  . Sexual Activity: None   Other Topics Concern  . None   Social History Narrative  . None   Family History  Problem Relation Age of Onset  . Colon polyps Father   . Diabetes Father   . Hypertension Father   . Colon cancer Neg Hx   . Rectal cancer Neg Hx   . Stomach cancer Neg Hx   . Hypertension Mother   . Asthma Mother   . Heart disease Maternal Grandmother    Review of Systems As above and he has had a colloid thyroid nodule aspirated and followed up with surgery about this is in observation    Objective:   Physical Exam General:  NAD, obese Eyes:   anicteric Lungs:  clear Heart:  S1S2 no rubs, murmurs or gallops Abdomen:  soft and nontender, BS+, obese, no splash Ext:   no edema  Data Reviewed:  Lab Results  Component Value Date   WBC 5.1 12/24/2012   HGB 15.0 12/24/2012   HCT 43.7 12/24/2012   MCV 87.0 12/24/2012   PLT 208.0 12/24/2012   Lab Results  Component Value Date   ALT 30 12/24/2012   AST 24 12/24/2012   ALKPHOS 105 12/24/2012   BILITOT 0.5 12/24/2012      Assessment & Plan:   1. Crohn's disease   2. Gastroparesis   3. Long-term use of immunosuppressant medication

## 2013-05-03 NOTE — Progress Notes (Signed)
Quick Note:  Labs are ok Needs to repeat LFT/CBC in 3 months ______

## 2013-05-05 ENCOUNTER — Other Ambulatory Visit: Payer: Self-pay

## 2013-05-05 DIAGNOSIS — K5090 Crohn's disease, unspecified, without complications: Secondary | ICD-10-CM

## 2013-06-03 ENCOUNTER — Ambulatory Visit: Payer: 59 | Admitting: Internal Medicine

## 2013-07-08 ENCOUNTER — Telehealth: Payer: Self-pay | Admitting: Internal Medicine

## 2013-07-08 DIAGNOSIS — R197 Diarrhea, unspecified: Secondary | ICD-10-CM

## 2013-07-08 NOTE — Telephone Encounter (Signed)
I spoke with [name], patient's wife, she will pick up the containers today and return them in the am.  He will keep the appt for tomorrow at 3:00.  He is aware to push fluids and remain on a bland diet until office visit tomorrow

## 2013-07-08 NOTE — Telephone Encounter (Signed)
Stool c diff pcr, cx and wbc if possible before visit

## 2013-07-08 NOTE — Telephone Encounter (Signed)
Left message for patient to call back  

## 2013-07-08 NOTE — Telephone Encounter (Signed)
Patient with 3 day history of continuous watery diarrhea.  NO vomiting or fever.  Nobody else in the house has been sick.  No recent antibiotics.  He is scheduled to come in and see Tye Savoy RNP tomorrow at 3:00.  [name] any other recommendations until office visit tomorrow?

## 2013-07-09 ENCOUNTER — Ambulatory Visit (INDEPENDENT_AMBULATORY_CARE_PROVIDER_SITE_OTHER): Payer: 59 | Admitting: Nurse Practitioner

## 2013-07-09 ENCOUNTER — Encounter: Payer: Self-pay | Admitting: Nurse Practitioner

## 2013-07-09 ENCOUNTER — Ambulatory Visit: Payer: 59

## 2013-07-09 VITALS — BP 140/92 | HR 82 | Ht 73.0 in | Wt 324.6 lb

## 2013-07-09 DIAGNOSIS — K5090 Crohn's disease, unspecified, without complications: Secondary | ICD-10-CM

## 2013-07-09 DIAGNOSIS — R197 Diarrhea, unspecified: Secondary | ICD-10-CM

## 2013-07-09 NOTE — Patient Instructions (Addendum)
Please follow B R A T diet Keep drinking plenty of fluids We are waiting on stool samples to come back, once we have them we will call you You can purchase Imodium over the counter and take 2 mg as needed for diarrhea _________________________________________________________________________  Diet for Diarrhea, Adult Frequent, runny stools (diarrhea) may be caused or worsened by food or drink. Diarrhea may be relieved by changing your diet. Since diarrhea can last up to 7 days, it is easy for you to lose too much fluid from the body and become dehydrated. Fluids that are lost need to be replaced. Along with a modified diet, make sure you drink enough fluids to keep your urine clear or pale yellow. DIET INSTRUCTIONS  Ensure adequate fluid intake (hydration): have 1 cup (8 oz) of fluid for each diarrhea episode. Avoid fluids that contain simple sugars or sports drinks, fruit juices, whole milk products, and sodas. Your urine should be clear or pale yellow if you are drinking enough fluids. Hydrate with an oral rehydration solution that you can purchase at pharmacies, retail stores, and online. You can prepare an oral rehydration solution at home by mixing the following ingredients together:    tsp table salt.   tsp baking soda.   tsp salt substitute containing potassium chloride.  1  tablespoons sugar.  1 L (34 oz) of water.  Certain foods and beverages may increase the speed at which food moves through the gastrointestinal (GI) tract. These foods and beverages should be avoided and include:  Caffeinated and alcoholic beverages.  High-fiber foods, such as raw fruits and vegetables, nuts, seeds, and whole grain breads and cereals.  Foods and beverages sweetened with sugar alcohols, such as xylitol, sorbitol, and mannitol.  Some foods may be well tolerated and may help thicken stool including:  Starchy foods, such as rice, toast, pasta, low-sugar cereal, oatmeal, grits, baked potatoes,  crackers, and bagels.   Bananas.   Applesauce.  Add probiotic-rich foods to help increase healthy bacteria in the GI tract, such as yogurt and fermented milk products. RECOMMENDED FOODS AND BEVERAGES Starches Choose foods with less than 2 g of fiber per serving.  Recommended:  White, [place], and pita breads, plain rolls, buns, bagels. Plain muffins, matzo. Soda, saltine, or graham crackers. Pretzels, melba toast, zwieback. Cooked cereals made with water: cornmeal, farina, cream cereals. Dry cereals: refined corn, wheat, rice. Potatoes prepared any way without skins, refined macaroni, spaghetti, noodles, refined rice.  Avoid:  Bread, rolls, or crackers made with whole wheat, multi-grains, rye, bran seeds, nuts, or coconut. Corn tortillas or taco shells. Cereals containing whole grains, multi-grains, bran, coconut, nuts, raisins. Cooked or dry oatmeal. Coarse wheat cereals, granola. Cereals advertised as "high-fiber." Potato skins. Whole grain pasta, wild or brown rice. Popcorn. Sweet potatoes, yams. Sweet rolls, doughnuts, waffles, pancakes, sweet breads. Vegetables  Recommended: Strained tomato and vegetable juices. Most well-cooked and canned vegetables without seeds. Fresh: Tender lettuce, cucumber without the skin, cabbage, spinach, bean sprouts.  Avoid: Fresh, cooked, or canned: Artichokes, baked beans, beet greens, broccoli, Brussels sprouts, corn, kale, legumes, peas, sweet potatoes. Cooked: Green or red cabbage, spinach. Avoid large servings of any vegetables because vegetables shrink when cooked, and they contain more fiber per serving than fresh vegetables. Fruit  Recommended: Cooked or canned: Apricots, applesauce, cantaloupe, cherries, fruit cocktail, grapefruit, grapes, kiwi, mandarin oranges, peaches, pears, plums, watermelon. Fresh: Apples without skin, ripe banana, grapes, cantaloupe, cherries, grapefruit, peaches, oranges, plums. Keep servings limited to  cup or 1  piece.  Avoid: Fresh: Apples with skin, apricots, mangoes, pears, raspberries, strawberries. Prune juice, stewed or dried prunes. Dried fruits, raisins, dates. Large servings of all fresh fruits. Protein  Recommended: Ground or well-cooked tender beef, ham, veal, lamb, pork, or poultry. Eggs. Fish, oysters, shrimp, lobster, other seafoods. Liver, organ meats.  Avoid: Tough, fibrous meats with gristle. Peanut butter, smooth or chunky. Cheese, nuts, seeds, legumes, dried peas, beans, lentils. Dairy  Recommended: Yogurt, lactose-free milk, kefir, drinkable yogurt, buttermilk, soy milk, or plain hard cheese.  Avoid: Milk, chocolate milk, beverages made with milk, such as milkshakes. Soups  Recommended: Bouillon, broth, or soups made from allowed foods. Any strained soup.  Avoid: Soups made from vegetables that are not allowed, cream or milk-based soups. Desserts and Sweets  Recommended: Sugar-free gelatin, sugar-free frozen ice pops made without sugar alcohol.  Avoid: Plain cakes and cookies, pie made with fruit, pudding, custard, cream pie. Gelatin, fruit, ice, sherbet, frozen ice pops. Ice cream, ice milk without nuts. Plain hard candy, honey, jelly, molasses, syrup, sugar, chocolate syrup, gumdrops, marshmallows. Fats and Oils  Recommended: Limit fats to less than 8 tsp per day.  Avoid: Seeds, nuts, olives, avocados. Margarine, butter, cream, mayonnaise, salad oils, plain salad dressings. Plain gravy, crisp bacon without rind. Beverages  Recommended: Water, decaffeinated teas, oral rehydration solutions, sugar-free beverages not sweetened with sugar alcohols.  Avoid: Fruit juices, caffeinated beverages (coffee, tea, soda), alcohol, sports drinks, or lemon-lime soda. Condiments  Recommended: Ketchup, mustard, horseradish, vinegar, cocoa powder. Spices in moderation: allspice, basil, bay leaves, celery powder or leaves, cinnamon, cumin powder, curry powder, ginger, mace, marjoram,  onion or garlic powder, oregano, paprika, parsley flakes, ground pepper, rosemary, sage, savory, tarragon, thyme, turmeric.  Avoid: Coconut, honey. Document Released: 09/09/2003 Document Revised: 03/13/2012 Document Reviewed: 11/03/2011 [facility] Patient Information 2014 [place].

## 2013-07-09 NOTE — Progress Notes (Signed)
     History of Present Illness:  Patient is a 43 year old male followed by Dr Carlean Purl for Crohn's disease of the upper and lower GI tract as well as gastroparesis. He is on 169m 631mand 4070mf Humira Q 2 weeks. Patient was seen in follow up late October as which time he complained on ongoing vomiting with solids.  Zofran had not helped. Domeperidone didn't really help either. He was tried on low dose Reglan and [facility].   Patient is here with wife for a three day history of diarrhea. His typical bowel pattern consists of 1 to 2 solid bowel movements a day. Early Monday morning he developed acute diarrhea, had about 9 bowel movements that day. Yesterday stools still watery, about 7 bowel movements total. Today he's only had 2 watery bowel movements. Stools nonbloody. No abdominal pain just a lot of gurgling in bowel gas. No fevers. No recent antibiotics. No sick contacts. Patient has already submitted stool studies to us,[place]ults are pending.   Current Medications, Allergies, Past Medical History, Past Surgical History, Family History and Social History were reviewed in [employer].  Physical Exam: General: Pleasant, Obese black male in no acute distress Head: Normocephalic and atraumatic Eyes:  sclerae anicteric, conjunctiva pink  Ears: Normal auditory acuity Lungs: Clear throughout to auscultation Heart: Regular rate and rhythm Abdomen: Soft, non distended, non-tender. No masses, no hepatomegaly. Normal bowel sounds Musculoskeletal: Symmetrical with no gross deformities  Extremities: No edema  Neurological: Alert oriented x 4, grossly nonfocal Psychological:  Alert and cooperative. Normal mood and affect  Assessment and Recommendations: 1. [age]ar old male with upper and lower GI tract Crohn's disease, maintained on 6-MP 150 mg daily and Humira 40 mg subcutaneous every 2 weeks. Here with acute diarrhea x3  days. Though stools still watery, he's had only 2 bowel movements today (he was having close to 9 a day at onset). Suspect this is infectious in nature. At this point, since diarrhea is getting better, will await stool studies to make further recommendations. Patient does not look toxic, abdominal exam is benign.  2. Gastroparesis. He is still having difficult with solids but doing okay with liquids, especially since adding low dose Reglan. [facility]

## 2013-07-10 ENCOUNTER — Encounter: Payer: Self-pay | Admitting: Nurse Practitioner

## 2013-07-10 DIAGNOSIS — R197 Diarrhea, unspecified: Secondary | ICD-10-CM | POA: Insufficient documentation

## 2013-07-10 LAB — CLOSTRIDIUM DIFFICILE BY PCR: Toxigenic C. Difficile by PCR: NOT DETECTED

## 2013-07-10 LAB — FECAL LACTOFERRIN, QUANT: Lactoferrin: NEGATIVE

## 2013-07-11 ENCOUNTER — Encounter: Payer: Self-pay | Admitting: Internal Medicine

## 2013-07-11 ENCOUNTER — Other Ambulatory Visit: Payer: Self-pay

## 2013-07-11 ENCOUNTER — Other Ambulatory Visit (INDEPENDENT_AMBULATORY_CARE_PROVIDER_SITE_OTHER): Payer: 59

## 2013-07-11 ENCOUNTER — Ambulatory Visit (AMBULATORY_SURGERY_CENTER): Payer: Self-pay | Admitting: *Deleted

## 2013-07-11 ENCOUNTER — Telehealth: Payer: Self-pay

## 2013-07-11 VITALS — Ht 73.0 in | Wt 324.0 lb

## 2013-07-11 DIAGNOSIS — R197 Diarrhea, unspecified: Secondary | ICD-10-CM

## 2013-07-11 DIAGNOSIS — K5090 Crohn's disease, unspecified, without complications: Secondary | ICD-10-CM

## 2013-07-11 LAB — CBC WITH DIFFERENTIAL/PLATELET
BASOS PCT: 0.6 % (ref 0.0–3.0)
Basophils Absolute: 0 10*3/uL (ref 0.0–0.1)
EOS PCT: 8.1 % — AB (ref 0.0–5.0)
Eosinophils Absolute: 0.4 10*3/uL (ref 0.0–0.7)
HCT: 47.1 % (ref 39.0–52.0)
Hemoglobin: 16.2 g/dL (ref 13.0–17.0)
LYMPHS PCT: 40.8 % (ref 12.0–46.0)
Lymphs Abs: 2.1 10*3/uL (ref 0.7–4.0)
MCHC: 34.4 g/dL (ref 30.0–36.0)
MCV: 85 fl (ref 78.0–100.0)
MONOS PCT: 17.9 % — AB (ref 3.0–12.0)
Monocytes Absolute: 0.9 10*3/uL (ref 0.1–1.0)
NEUTROS ABS: 1.7 10*3/uL (ref 1.4–7.7)
Neutrophils Relative %: 32.6 % — ABNORMAL LOW (ref 43.0–77.0)
Platelets: 199 10*3/uL (ref 150.0–400.0)
RBC: 5.55 Mil/uL (ref 4.22–5.81)
RDW: 14.3 % (ref 11.5–14.6)
WBC: 5.1 10*3/uL (ref 4.5–10.5)

## 2013-07-11 LAB — HEPATIC FUNCTION PANEL
ALBUMIN: 3.5 g/dL (ref 3.5–5.2)
ALK PHOS: 96 U/L (ref 39–117)
ALT: 36 U/L (ref 0–53)
AST: 29 U/L (ref 0–37)
Bilirubin, Direct: 0.1 mg/dL (ref 0.0–0.3)
TOTAL PROTEIN: 7.4 g/dL (ref 6.0–8.3)
Total Bilirubin: 0.5 mg/dL (ref 0.3–1.2)

## 2013-07-11 MED ORDER — DIPHENOXYLATE-ATROPINE 2.5-0.025 MG PO TABS: ORAL_TABLET | ORAL | Status: DC

## 2013-07-11 NOTE — Addendum Note (Signed)
Addended by: Marlon Pel on: 07/11/2013 11:49 AM   Modules accepted: Orders

## 2013-07-11 NOTE — Progress Notes (Signed)
Quick Note:  Lomotil 1-2 q 6 hrs prn # 90 no refill Stay hydrated - stay on BRAT Order blood test "Cytomegalovirus by PCR - qualtitative" need to get today Any fever? ______

## 2013-07-11 NOTE — Telephone Encounter (Signed)
Message copied by Marlon Pel on Fri Jul 11, 2013 11:50 AM ------      Message from: Gatha Mayer      Created: Fri Jul 11, 2013 10:49 AM      Regarding: flex sig       Please also book him into Monday flex sig spot - opening Mon AM also            Needs enema x 1 to prep - we could give when he gets here if necessary ------

## 2013-07-11 NOTE — Progress Notes (Signed)
Pt told he can either pick up an enema at the drug store and give it himself at home or we could do it here when he gets here Monday

## 2013-07-11 NOTE — Telephone Encounter (Signed)
Pre-visit today 1:00, flex 07/14/13 10:30

## 2013-07-11 NOTE — Progress Notes (Signed)
Agree with Ms. Guenther's assessment and plan. Gatha Mayer, MD, [facility]

## 2013-07-11 NOTE — Progress Notes (Signed)
Quick Note:  Stool studies looking negative Please get a symptom update ______

## 2013-07-11 NOTE — Progress Notes (Unsigned)
This encounter was created in error - please disregard.

## 2013-07-13 ENCOUNTER — Other Ambulatory Visit: Payer: Self-pay | Admitting: Family Medicine

## 2013-07-13 LAB — STOOL CULTURE

## 2013-07-13 LAB — CYTOMEGALOVIRUS PCR, QUALITATIVE: CMV DNA, Qual PCR: NOT DETECTED

## 2013-07-14 ENCOUNTER — Other Ambulatory Visit: Payer: Self-pay | Admitting: Internal Medicine

## 2013-07-14 ENCOUNTER — Ambulatory Visit (AMBULATORY_SURGERY_CENTER): Payer: 59 | Admitting: Internal Medicine

## 2013-07-14 ENCOUNTER — Encounter: Payer: Self-pay | Admitting: Internal Medicine

## 2013-07-14 VITALS — BP 147/85 | HR 66 | Temp 98.6°F | Resp 16 | Ht 73.0 in | Wt 324.0 lb

## 2013-07-14 DIAGNOSIS — K5289 Other specified noninfective gastroenteritis and colitis: Secondary | ICD-10-CM

## 2013-07-14 DIAGNOSIS — K50919 Crohn's disease, unspecified, with unspecified complications: Secondary | ICD-10-CM

## 2013-07-14 DIAGNOSIS — K5090 Crohn's disease, unspecified, without complications: Secondary | ICD-10-CM

## 2013-07-14 DIAGNOSIS — R197 Diarrhea, unspecified: Secondary | ICD-10-CM

## 2013-07-14 MED ORDER — SODIUM CHLORIDE 0.9 % IV SOLN: 500.0000 mL | INTRAVENOUS | Status: DC

## 2013-07-14 NOTE — Assessment & Plan Note (Signed)
Slight patchy erythema in one area of left colon - bxs taken

## 2013-07-14 NOTE — Op Note (Signed)
[place]  [address] [place], [id number]   FLEXIBLE SIGMOIDOSCOPY PROCEDURE REPORT  PATIENT: Craig, Guzman  MR#: #[id number] BIRTHDATE: [birth year] , 42  yrs. old GENDER: Male ENDOSCOPIST: Gatha Mayer, MD, [facility] PROCEDURE DATE:  07/14/2013 PROCEDURE:   Sigmoidoscopy with biopsy ASA CLASS:   Class III INDICATIONS:unexplained diarrhea.   high risk patient with previously diagnosed Crohn's Disease. MEDICATIONS: propofol (Diprivan) 269m IV, MAC sedation, administered by CRNA, and These medications were titrated to patient response per physician's verbal order  DESCRIPTION OF PROCEDURE:   After the risks benefits and alternatives of the procedure were thoroughly explained, informed consent was obtained.  revealed tenderness of the rectum. The LB PFC-H190 [id number] endoscope was introduced through the anus  and advanced to the descending colon , limited by nothing.  No adverse events experienced.   The quality of the prep was good .  The instrument was then slowly withdrawn as the mucosa was fully examined.       There was some patchy erythema in the descending colon - very mild. Otherwise normal. I took cold biopsies of the erythema and normal mucosa.        Retroflexed views revealed no abnormalities.    The scope was then withdrawn from the patient and the procedure terminated.  COMPLICATIONS: There were no complications.  ENDOSCOPIC IMPRESSION: Mild erythema - small area, otherwise normal. Biopsies taken. - sounds like may finally be improving RECOMMENDATIONS: 1.  await biopsy results - will call 2.   Work note for today      eSigned:  CGatha Mayer MD, [facility]01/06/2014 11:09 AM   CC:The Patient

## 2013-07-14 NOTE — Progress Notes (Signed)
Procedure ends, to recovery, report given and VSS. 

## 2013-07-14 NOTE — Progress Notes (Signed)
Called to room to assist during endoscopic procedure.  Patient ID and intended procedure confirmed with present staff. Received instructions for my participation in the procedure from the performing physician.  

## 2013-07-14 NOTE — Patient Instructions (Addendum)
It sounds like things may be coming back to normal. I took biopsies of the colon to look for infection - no infections on other studies but the biopsies help me look for a virus that may only show up that way.  We will call with results. i hope you continue to improve.  I appreciate the opportunity to care for you. Gatha Mayer, MD, FACG  YOU HAD AN ENDOSCOPIC PROCEDURE TODAY AT [facility]: Refer to the procedure report that was given to you for any specific questions about what was found during the examination.  If the procedure report does not answer your questions, please call your gastroenterologist to clarify.  If you requested that your care partner not be given the details of your procedure findings, then the procedure report has been included in a sealed envelope for you to review at your convenience later.  YOU SHOULD EXPECT: Some feelings of bloating in the abdomen. Passage of more gas than usual.  Walking can help get rid of the air that was put into your GI tract during the procedure and reduce the bloating. If you had a lower endoscopy (such as a colonoscopy or flexible sigmoidoscopy) you may notice spotting of blood in your stool or on the toilet paper. If you underwent a bowel prep for your procedure, then you may not have a normal bowel movement for a few days.  DIET: Your first meal following the procedure should be a light meal and then it is ok to progress to your normal diet.  A half-sandwich or bowl of soup is an example of a good first meal.  Heavy or fried foods are harder to digest and may make you feel nauseous or bloated.  Likewise meals heavy in dairy and vegetables can cause extra gas to form and this can also increase the bloating.  Drink plenty of fluids but you should avoid alcoholic beverages for 24 hours.  ACTIVITY: Your care partner should take you home directly after the procedure.  You should plan to take it easy, moving slowly for the rest of  the day.  You can resume normal activity the day after the procedure however you should NOT DRIVE or use heavy machinery for 24 hours (because of the sedation medicines used during the test).    SYMPTOMS TO REPORT IMMEDIATELY: A gastroenterologist can be reached at any hour.  During normal business hours, 8:30 AM to 5:00 PM Monday through Friday, call [phone].  After hours and on weekends, please call the GI answering service at [phone] who will take a message and have the physician on call contact you.   Following lower endoscopy (colonoscopy or flexible sigmoidoscopy):  Excessive amounts of blood in the stool  Significant tenderness or worsening of abdominal pains  Swelling of the abdomen that is new, acute  Fever of 100F or higher  FOLLOW UP: If any biopsies were taken you will be contacted by phone or by letter within the next 1-3 weeks.  Call your gastroenterologist if you have not heard about the biopsies in 3 weeks.  Our staff will call the home number listed on your records the next business day following your procedure to check on you and address any questions or concerns that you may have at that time regarding the information given to you following your procedure. This is a courtesy call and so if there is no answer at the home number and we have not heard from you through  the emergency physician on call, we will assume that you have returned to your regular daily activities without incident.  SIGNATURES/CONFIDENTIALITY: You and/or your care partner have signed paperwork which will be entered into your electronic medical record.  These signatures attest to the fact that that the information above on your After Visit Summary has been reviewed and is understood.  Full responsibility of the confidentiality of this discharge information lies with you and/or your care-partner.  Biopsies taken, wait results, will call with results.  Work note given.

## 2013-07-15 ENCOUNTER — Telehealth: Payer: Self-pay | Admitting: *Deleted

## 2013-07-15 NOTE — Telephone Encounter (Signed)
Number identifier, left message, follow-up

## 2013-07-21 NOTE — Progress Notes (Signed)
Quick Note:  OK Ask him to follow-up in Feb and call back sooner if problems/worse ______

## 2013-07-21 NOTE — Progress Notes (Signed)
Quick Note:  Slight inflammation only No infections  How is he?  No letter or recall from [place] ______

## 2013-08-06 ENCOUNTER — Telehealth: Payer: Self-pay | Admitting: Internal Medicine

## 2013-08-06 NOTE — Telephone Encounter (Signed)
Patient's wife calling to report that when patient burps it smells like it does when he passes gas. He continues to have diarrhea and some nausea. Denies stomach pain. Moved patient's return OV to 08/08/13 at 9:45 AM.

## 2013-08-08 ENCOUNTER — Encounter: Payer: Self-pay | Admitting: Internal Medicine

## 2013-08-08 ENCOUNTER — Ambulatory Visit (INDEPENDENT_AMBULATORY_CARE_PROVIDER_SITE_OTHER): Payer: 59 | Admitting: Internal Medicine

## 2013-08-08 VITALS — BP 162/92 | HR 80 | Ht 71.75 in | Wt 328.2 lb

## 2013-08-08 DIAGNOSIS — K3184 Gastroparesis: Secondary | ICD-10-CM

## 2013-08-08 DIAGNOSIS — K5090 Crohn's disease, unspecified, without complications: Secondary | ICD-10-CM

## 2013-08-08 DIAGNOSIS — R197 Diarrhea, unspecified: Secondary | ICD-10-CM

## 2013-08-08 MED ORDER — SACCHAROMYCES BOULARDII 250 MG PO CAPS: 250.0000 mg | ORAL_CAPSULE | Freq: Two times a day (BID) | ORAL | Status: DC

## 2013-08-08 NOTE — Assessment & Plan Note (Addendum)
Overall better I think - he is recovering from the diarrheal illness of unclear cause Wait to see Dr. Derrill Kay - if deteriorates again then will have him undergo CT-E

## 2013-08-08 NOTE — Assessment & Plan Note (Addendum)
resolving at this time though he has lingering problems with bloating and belching and gas. Will add Florastor probiotic twice a day for one month. Should this flare again, I would consider a CT enterography.

## 2013-08-08 NOTE — Patient Instructions (Addendum)
You have been given a separate informational sheet regarding your tobacco use, the importance of quitting and local resources to help you quit.  Today you have been given Florastor probiotic to take one capsule twice a day.  Call us after your visit to Dr. Derrill Kay in March with an update.   I appreciate the opportunity to care for you.

## 2013-08-08 NOTE — Assessment & Plan Note (Addendum)
Waiting to see Dr. Derrill Kay, he continues to do better than he had in the past though he does still have intermittent symptoms.

## 2013-08-08 NOTE — Progress Notes (Signed)
         Subjective:    Patient ID: Airen Stiehl, male    DOB: 29-Dec-1970, 43 y.o.   MRN: [id number]  HPI Areas here for followup of his upper and lower Crohn's disease. He has recently had diarrhea of unclear etiology. It has spontaneously improved. Flexible sigmoidoscopy was unrevealing. He reports resolution of loose stool sometime in the last week. He continues to have intermittent regurgitation and vomiting issues. He is complaining of foul-smelling flatus and belching, saying that his burps smell just as bad as his flatus. He is due to go to [facility] to see Dr. Derrill Kay, in March. Wt Readings from Last 3 Encounters:  08/08/13 328 lb 4 oz (148.893 kg)  07/14/13 324 lb (146.965 kg)  07/11/13 324 lb (146.965 kg)   Review of Systems As above    Objective:   Physical Exam General:  NAD, obese Heart:  S1S2 no rubs, murmurs or gallops Abdomen:  soft and nontender, BS+    Data Reviewed:  Flexible sigmoidoscopy and biopsies from last month     Assessment & Plan:   1. Crohn's disease of stomach and colon   2. Diarrhea   3. Gastroparesis

## 2013-08-22 ENCOUNTER — Other Ambulatory Visit: Payer: Self-pay | Admitting: Internal Medicine

## 2013-08-22 NOTE — Telephone Encounter (Signed)
May I refill Craig Guzman?

## 2013-08-22 NOTE — Telephone Encounter (Signed)
This came to my basket this AM, would you like me to refill?

## 2013-08-25 ENCOUNTER — Ambulatory Visit (INDEPENDENT_AMBULATORY_CARE_PROVIDER_SITE_OTHER): Payer: 59 | Admitting: Family Medicine

## 2013-08-25 ENCOUNTER — Encounter: Payer: Self-pay | Admitting: Family Medicine

## 2013-08-25 VITALS — BP 178/118 | HR 81 | Temp 98.2°F | Resp 16 | Ht 72.5 in | Wt 324.0 lb

## 2013-08-25 DIAGNOSIS — G43909 Migraine, unspecified, not intractable, without status migrainosus: Secondary | ICD-10-CM

## 2013-08-25 DIAGNOSIS — I10 Essential (primary) hypertension: Secondary | ICD-10-CM

## 2013-08-25 DIAGNOSIS — E785 Hyperlipidemia, unspecified: Secondary | ICD-10-CM

## 2013-08-25 DIAGNOSIS — I160 Hypertensive urgency: Secondary | ICD-10-CM

## 2013-08-25 LAB — COMPLETE METABOLIC PANEL WITH GFR
ALT: 30 U/L (ref 0–53)
AST: 26 U/L (ref 0–37)
Albumin: 4.1 g/dL (ref 3.5–5.2)
Alkaline Phosphatase: 95 U/L (ref 39–117)
BUN: 10 mg/dL (ref 6–23)
CALCIUM: 9 mg/dL (ref 8.4–10.5)
CO2: 26 meq/L (ref 19–32)
Chloride: 106 mEq/L (ref 96–112)
Creat: 1.03 mg/dL (ref 0.50–1.35)
GFR, Est Non African American: >89 mL/min
Glucose, Bld: 96 mg/dL (ref 70–99)
Potassium: 4.4 mEq/L (ref 3.5–5.3)
SODIUM: 139 meq/L (ref 135–145)
TOTAL PROTEIN: 7.5 g/dL (ref 6.0–8.3)
Total Bilirubin: 0.4 mg/dL (ref 0.2–1.2)

## 2013-08-25 LAB — LIPID PANEL
Cholesterol: 158 mg/dL (ref 0–200)
HDL: 30 mg/dL — AB (ref >39)
LDL CALC: 88 mg/dL (ref 0–99)
Total CHOL/HDL Ratio: 5.3 Ratio
Triglycerides: 201 mg/dL — ABNORMAL HIGH (ref <150)
VLDL: 40 mg/dL (ref 0–40)

## 2013-08-25 MED ORDER — VERAPAMIL HCL 180 MG (CO) PO TB24: 360.0000 mg | ORAL_TABLET | Freq: Every day | ORAL | Status: DC

## 2013-08-25 MED ORDER — LISINOPRIL-HYDROCHLOROTHIAZIDE 20-12.5 MG PO TABS: ORAL_TABLET | ORAL | Status: DC

## 2013-08-25 MED ORDER — HYDROCODONE-ACETAMINOPHEN 5-325 MG PO TABS: 1.0000 | ORAL_TABLET | Freq: Four times a day (QID) | ORAL | Status: DC | PRN

## 2013-08-25 NOTE — Progress Notes (Signed)
Subjective:    Patient ID: Craig Guzman, male    DOB: 1970/11/05, 43 y.o.   MRN: [id number]  HPI Craig Guzman is a 43 y.o. male  Here for follow up HTN - last seen by me in 03/2012. Also has appt scheduled March 30th for CPE. Has been under care for Crohn's with [name], and appt with [facility] next month.   Out of verapamil 19m qd. for past week. Takes lisinopril/hct 20/12.581m- 2 qd - no missed doses. Staff note reviewed, but he had leftover rx that was taken. Compliant with meds except verapamil recently. Headache for past 2 days - no slurred speech, no arm/leg weakness.no chest pain. Urinating normally.  No diplopia or vision changes. Usual BP 124/82 when taking medicines. No new side effects of meds when taking them.exercise 2x/week.   Fasting currently.   additionally needs refill of hydrocodone for migraines - did not see HA specialist yet.  Typical ha today as migraines in past, but out of hydrocodone that has helped with pain before.   Lab Results  Component Value Date   CHOL 171 09/01/2011   HDL 32* 09/01/2011   LDLCALC 108* 09/01/2011   TRIG 156* 09/01/2011   CHOLHDL 5.3 09/01/2011         Chemistry      Component Value Date/Time   NA 139 12/24/2012 1239   K 3.7 12/24/2012 1239   CL 106 12/24/2012 1239   CO2 26 12/24/2012 1239   BUN 11 12/24/2012 1239   CREATININE 1.1 12/24/2012 1239   CREATININE 1.11 02/07/2012 1610      Component Value Date/Time   CALCIUM 9.1 12/24/2012 1239   ALKPHOS 96 07/11/2013 1246   AST 29 07/11/2013 1246   ALT 36 07/11/2013 1246   BILITOT 0.5 07/11/2013 1246        Patient Active Problem List   Diagnosis Date Noted  . Diarrhea 07/10/2013  . 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 Hypertension 11/26/2012  . Gastroparesis 12/08/2011  . Migraine headache 12/08/2011  . Long-term use of immunosuppressant medication- 6 MPand Humira 08/08/2011  . Crohn's disease of stomach and colon 05/17/2011  . GERD (gastroesophageal reflux disease) 04/21/2011    Past Medical History  Diagnosis Date  . GERD (gastroesophageal reflux disease)   . HTN (hypertension)   . Gastric ulcer     antral  . Biliary dyskinesia   . Anxiety   . Obesity   . History of migraine headaches   . Allergy     trees/ grasses/ animals/dust/mold  . Crohn's disease     Stomach, terminal ileum, cecum  . Kidney stone 09/20/2012  . Colloid thyroid nodule    Past Surgical History  Procedure Laterality Date  . Cholecystectomy  2011    Rosenbower  . Vasectomy      bilateral w/lysis of penile adhesions  . Esophagogastroduodenoscopy  05/04/11, 07/26/11    granulomatous gastritis - Crohn's  . Multiple tooth extractions  2005  . Colonoscopy  07/26/11    Crohn's colitis, ileitis  . Shoulder surgery      right  . Joint replacement    . Flexible sigmoidoscopy     Allergies  Allergen Reactions  . Zithromax [Azithromycin] Hypertension   Prior to Admission medications   Medication Sig Start Date End Date Taking? Authorizing Provider  cyclobenzaprine (FLEXERIL) 10 MG tablet Take 1 tablet (10 mg total) by mouth 3 (three) times daily as needed. 11/26/12  Yes KuRobyn HaberMD  DERoss Marcus  60 MG capsule TAKE ONE CAPSULE BY MOUTH EVERY DAY RECHECK BEFORE OUT 11/11/11  Yes Ryan M Dunn, PA-C  diphenoxylate-atropine (LOMOTIL) 2.5-0.025 MG per tablet Take 1-2 po q 6 hours prn diarrhea 07/11/13  Yes Gatha Mayer, MD  HUMIRA PEN 40 MG/0.8ML injection INJECT 1 PEN              SUBCUTANEOUSLY EVERY      OTHER WEEK 08/22/13  Yes Gatha Mayer, MD  HYDROcodone-acetaminophen (NORCO) 5-325 MG per tablet Take 1 tablet by mouth every 6 (six) hours as needed for pain. 01/14/13  Yes Robyn Haber, MD  lisinopril-hydrochlorothiazide (PRINZIDE,ZESTORETIC) 20-12.5 MG per tablet TAKE 2 TABLETS BY MOUTH DAILY. 01/24/13  Yes Robyn Haber, MD  mercaptopurine (PURINETHOL) 50 MG tablet Take 3 tablets (150 mg total) by mouth daily. Give on an empty stomach 1 hour before or 2 hours after meals. Caution:  Chemotherapy. 09/25/12  Yes Gatha Mayer, MD  ondansetron (ZOFRAN-ODT) 8 MG disintegrating tablet Take 1 tablet (8 mg total) by mouth every 8 (eight) hours as needed for nausea. 12/24/12  Yes Gatha Mayer, MD  saccharomyces boulardii (FLORASTOR) 250 MG capsule Take 1 capsule (250 mg total) by mouth 2 (two) times daily. 08/08/13  Yes Gatha Mayer, MD  topiramate (TOPAMAX) 25 MG tablet Take 3 tablets (75 mg total) by mouth at bedtime. 03/20/13  Yes Robyn Haber, MD  verapamil (COVERA HS) 180 MG (CO) 24 hr tablet Take 1 tablet (180 mg total) by mouth 2 (two) times daily. 09/01/11  Yes [name], MD   History   Social History  . Marital Status: Married    Spouse Name: N/A    Number of Children: 3  . Years of Education: N/A   Occupational History  . [employer]   .     Social History Main Topics  . Smoking status: Current Every Day Smoker -- 1.00 packs/day for 14 years    Types: Cigarettes  . Smokeless tobacco: Former Systems developer     Comment: Counseling sheet given in exam room   . Alcohol Use: No  . Drug Use: No  . Sexual Activity: Not on file   Other Topics Concern  . Not on file   Social History Narrative  . No narrative on file      Review of Systems  Constitutional: Negative for fever, chills, fatigue and unexpected weight change.  Eyes: Negative for visual disturbance.  Respiratory: Negative for cough, chest tightness and shortness of breath.   Cardiovascular: Negative for chest pain, palpitations and leg swelling.  Gastrointestinal: Negative for abdominal pain and blood in stool.  Genitourinary: Negative for decreased urine volume and difficulty urinating.  Skin: Negative for rash.  Neurological: Positive for headaches. Negative for dizziness, syncope, facial asymmetry, speech difficulty, weakness and light-headedness.       Objective:   Physical Exam  Vitals reviewed. Constitutional: He is oriented to person, place, and time. He appears well-developed and  well-nourished.  HENT:  Head: Normocephalic and atraumatic.  Eyes: EOM are normal. Pupils are equal, round, and reactive to light.  Neck: No JVD present. Carotid bruit is not present.  Cardiovascular: Normal rate, regular rhythm and normal heart sounds.   No murmur heard. Pulmonary/Chest: Effort normal and breath sounds normal. He has no rales.  Musculoskeletal: He exhibits no edema.  Neurological: He is alert and oriented to person, place, and time. He has normal strength. No cranial nerve deficit. He displays a negative Romberg sign.  Coordination and gait normal. GCS eye subscore is 4. GCS verbal subscore is 5. GCS motor subscore is 6.  nonfocal exam, no pronator drift.   Skin: Skin is warm and dry.  Psychiatric: He has a normal mood and affect.       Assessment & Plan:   Craig Guzman is a 43 y.o. male Migraine - Plan: HYDROcodone-acetaminophen (Sobieski) 5-325 MG per tablet without mention of status migrainosus - typical HA, migraine possible but may also be d/t uncontrolled HTN. nonfocal neuro exam, but ER/911 precautions discussed below. Will return in next week to discuss mgt plan further, including HA specialist and preventative regimen.   Hypertensive urgency - Plan: lisinopril-hydrochlorothiazide (PRINZIDE,ZESTORETIC) 20-12.5 MG per tablet, verapamil (COVERA HS) 180 MG (CO) 24 hr tablet - med nonadherent to verapamil.  No concerning findings on exam, restart verapamil, continue zestoretic at same dose, check home BP's and RTC/ER precautions given.   Hyperlipidemia - lipid panel pending as not checked since 2013 - borderline low HDL, elevated HDL and trigs then.    Meds ordered this encounter  Medications  . HYDROcodone-acetaminophen (NORCO) 5-325 MG per tablet    Sig: Take 1 tablet by mouth every 6 (six) hours as needed.    Dispense:  20 tablet    Refill:  0  . lisinopril-hydrochlorothiazide (PRINZIDE,ZESTORETIC) 20-12.5 MG per tablet    Sig: TAKE 2 TABLETS BY MOUTH DAILY.      Dispense:  180 tablet    Refill:  1  . verapamil (COVERA HS) 180 MG (CO) 24 hr tablet    Sig: Take 2 tablets (360 mg total) by mouth daily.    Dispense:  180 tablet    Refill:  1   Patient Instructions  Restart verapamil and check your blood pressures on home machine.  If not under 140/90 in next 3-4 days - return for recheck, otherwise recheck with me next week to check blood pressure on medicines and discuss your migraines. I will refill hydrocodone today until we can discuss this in more detail next office visit. Return to the clinic or go to the nearest emergency room if any of your symptoms worsen or new symptoms occur including but not limited to chest pain, worsened headache, vision changes, dizziness or weakness.  You should receive a call or letter about your lab results within the next week to 10 days.  Hypertension As your heart beats, it forces blood through your arteries. This force is your blood pressure. If the pressure is too high, it is called hypertension (HTN) or high blood pressure. HTN is dangerous because you may have it and not know it. High blood pressure may mean that your heart has to work harder to pump blood. Your arteries may be narrow or stiff. The extra work puts you at risk for heart disease, stroke, and other problems.  Blood pressure consists of two numbers, a higher number over a lower, 110/72, for example. It is stated as "110 over 72." The ideal is below 120 for the top number (systolic) and under 80 for the bottom (diastolic). Write down your blood pressure today. You should pay close attention to your blood pressure if you have certain conditions such as:  Heart failure.  Prior heart attack.  Diabetes  Chronic kidney disease.  Prior stroke.  Multiple risk factors for heart disease. To see if you have HTN, your blood pressure should be measured while you are seated with your arm held at the level of the heart. It  should be measured at least twice. A  one-time elevated blood pressure reading (especially in the Emergency Department) does not mean that you need treatment. There may be conditions in which the blood pressure is different between your right and left arms. It is important to see your caregiver soon for a recheck. Most people have essential hypertension which means that there is not a specific cause. This type of high blood pressure may be lowered by changing lifestyle factors such as:  Stress.  Smoking.  Lack of exercise.  Excessive weight.  Drug/tobacco/alcohol use.  Eating less salt. Most people do not have symptoms from high blood pressure until it has caused damage to the body. Effective treatment can often prevent, delay or reduce that damage. TREATMENT  When a cause has been identified, treatment for high blood pressure is directed at the cause. There are a large number of medications to treat HTN. These fall into several categories, and your caregiver will help you select the medicines that are best for you. Medications may have side effects. You should review side effects with your caregiver. If your blood pressure stays high after you have made lifestyle changes or started on medicines,   Your medication(s) may need to be changed.  Other problems may need to be addressed.  Be certain you understand your prescriptions, and know how and when to take your medicine.  Be sure to follow up with your caregiver within the time frame advised (usually within two weeks) to have your blood pressure rechecked and to review your medications.  If you are taking more than one medicine to lower your blood pressure, make sure you know how and at what times they should be taken. Taking two medicines at the same time can result in blood pressure that is too low. SEEK IMMEDIATE MEDICAL CARE IF:  You develop a severe headache, blurred or changing vision, or confusion.  You have unusual weakness or numbness, or a faint feeling.  You  have severe chest or abdominal pain, vomiting, or breathing problems. MAKE SURE YOU:   Understand these instructions.  Will watch your condition.  Will get help right away if you are not doing well or get worse. Document Released: 06/19/2005 Document Revised: 09/11/2011 Document Reviewed: 02/07/2008 [facility] Patient Information 2014 [place].

## 2013-08-25 NOTE — Patient Instructions (Addendum)
Restart verapamil and check your blood pressures on home machine.  If not under 140/90 in next 3-4 days - return for recheck, otherwise recheck with me next week to check blood pressure on medicines and discuss your migraines. I will refill hydrocodone today until we can discuss this in more detail next office visit. Return to the clinic or go to the nearest emergency room if any of your symptoms worsen or new symptoms occur including but not limited to chest pain, worsened headache, vision changes, dizziness or weakness.  You should receive a call or letter about your lab results within the next week to 10 days.  Hypertension As your heart beats, it forces blood through your arteries. This force is your blood pressure. If the pressure is too high, it is called hypertension (HTN) or high blood pressure. HTN is dangerous because you may have it and not know it. High blood pressure may mean that your heart has to work harder to pump blood. Your arteries may be narrow or stiff. The extra work puts you at risk for heart disease, stroke, and other problems.  Blood pressure consists of two numbers, a higher number over a lower, 110/72, for example. It is stated as "110 over 72." The ideal is below 120 for the top number (systolic) and under 80 for the bottom (diastolic). Write down your blood pressure today. You should pay close attention to your blood pressure if you have certain conditions such as:  Heart failure.  Prior heart attack.  Diabetes  Chronic kidney disease.  Prior stroke.  Multiple risk factors for heart disease. To see if you have HTN, your blood pressure should be measured while you are seated with your arm held at the level of the heart. It should be measured at least twice. A one-time elevated blood pressure reading (especially in the Emergency Department) does not mean that you need treatment. There may be conditions in which the blood pressure is different between your right and left  arms. It is important to see your caregiver soon for a recheck. Most people have essential hypertension which means that there is not a specific cause. This type of high blood pressure may be lowered by changing lifestyle factors such as:  Stress.  Smoking.  Lack of exercise.  Excessive weight.  Drug/tobacco/alcohol use.  Eating less salt. Most people do not have symptoms from high blood pressure until it has caused damage to the body. Effective treatment can often prevent, delay or reduce that damage. TREATMENT  When a cause has been identified, treatment for high blood pressure is directed at the cause. There are a large number of medications to treat HTN. These fall into several categories, and your caregiver will help you select the medicines that are best for you. Medications may have side effects. You should review side effects with your caregiver. If your blood pressure stays high after you have made lifestyle changes or started on medicines,   Your medication(s) may need to be changed.  Other problems may need to be addressed.  Be certain you understand your prescriptions, and know how and when to take your medicine.  Be sure to follow up with your caregiver within the time frame advised (usually within two weeks) to have your blood pressure rechecked and to review your medications.  If you are taking more than one medicine to lower your blood pressure, make sure you know how and at what times they should be taken. Taking two medicines at the  same time can result in blood pressure that is too low. SEEK IMMEDIATE MEDICAL CARE IF:  You develop a severe headache, blurred or changing vision, or confusion.  You have unusual weakness or numbness, or a faint feeling.  You have severe chest or abdominal pain, vomiting, or breathing problems. MAKE SURE YOU:   Understand these instructions.  Will watch your condition.  Will get help right away if you are not doing well or get  worse. Document Released: 06/19/2005 Document Revised: 09/11/2011 Document Reviewed: 02/07/2008 [facility] Patient Information 2014 [place].

## 2013-08-25 NOTE — Progress Notes (Signed)
Spoke to pharmacy [facility], patient last filled 90 day supply of the Lisinopril in July 2014, and has not filled the Verapamil there ever. Patient does not appear to be taking his medications as directed.

## 2013-08-27 ENCOUNTER — Ambulatory Visit: Payer: 59 | Admitting: Internal Medicine

## 2013-08-28 ENCOUNTER — Telehealth: Payer: Self-pay

## 2013-08-28 NOTE — Telephone Encounter (Signed)
Patient's wife is calling to update Dr. Carlota Raspberry with patient's blood pressure reading that he did at home. They are as follows: Monday- 148/98, Tuesday- 163/98, Wednesday- 148/92; 134/84; 143/92, Thursday- 165/120;198/120  Please call [phone]

## 2013-08-29 ENCOUNTER — Other Ambulatory Visit: Payer: Self-pay | Admitting: Internal Medicine

## 2013-08-29 ENCOUNTER — Telehealth: Payer: Self-pay

## 2013-08-29 NOTE — Telephone Encounter (Signed)
Informed patient and he said he will arrive early there to fill out the paperwork because his got miss placed.

## 2013-08-29 NOTE — Telephone Encounter (Signed)
Message copied by [place], Monea Pesantez E on Fri Aug 29, 2013  8:56 AM ------      Message from: [place], Bronc Brosseau E      Created: Wed Aug 20, 2013  8:28 AM                   ----- Message -----         From: Kurtis Anastasia E [place], CMA         Sent: 08/20/2013           To: Nija Koopman E [place], [place]            Call and remind him of his Dr. Derrill Kay appointment at [facility] 09/03/2013 at 9 AM.  ------

## 2013-08-31 NOTE — Telephone Encounter (Signed)
Still some variability and still elevated. Recheck this week to look at hypertension regimen as planned and bring meter to make sure these readings are accurate. Any change/increase in HA or other new symptoms - here or ER sooner.

## 2013-09-01 NOTE — Telephone Encounter (Signed)
Pt.notified

## 2013-09-19 ENCOUNTER — Ambulatory Visit (INDEPENDENT_AMBULATORY_CARE_PROVIDER_SITE_OTHER): Payer: 59 | Admitting: Family Medicine

## 2013-09-19 ENCOUNTER — Telehealth: Payer: Self-pay | Admitting: Internal Medicine

## 2013-09-19 VITALS — BP 148/86 | HR 72 | Temp 98.1°F | Resp 16 | Ht 73.0 in | Wt 324.0 lb

## 2013-09-19 DIAGNOSIS — Z113 Encounter for screening for infections with a predominantly sexual mode of transmission: Secondary | ICD-10-CM

## 2013-09-19 DIAGNOSIS — Z202 Contact with and (suspected) exposure to infections with a predominantly sexual mode of transmission: Secondary | ICD-10-CM

## 2013-09-19 DIAGNOSIS — I10 Essential (primary) hypertension: Secondary | ICD-10-CM

## 2013-09-19 NOTE — Progress Notes (Addendum)
[facility] [address] [place] [address] [phone]  Date:  09/19/2013   Name:  Craig Guzman   DOB:  09-03-1970   MRN:  [id number]  PCP:  [name], MD    Chief Complaint: Hypertension and Exposure to STD   History of Present Illness:  Craig Guzman is a 43 y.o. very pleasant male patient who presents with the following:  He was here about 3 weeks ago- at that time he has been out of her verapamil for about one week.  He was still on his lisinopril/ hctz.  He was started back on her verapamil.  At that time BP was 178/110.   His wife was recently tested for STI and found to be positive for chlamydia.  She did not have gonorrhea.    Checking his BP at home- running 120-140/80-90.   He has not noted any urinary sx- no penile discharge.   He last urinated a couple of hours ago.    Patient Active Problem List   Diagnosis Date Noted  . Diarrhea 07/10/2013  . 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 Hypertension 11/26/2012  . Gastroparesis 12/08/2011  . Migraine headache 12/08/2011  . Long-term use of immunosuppressant medication- 6 MPand Humira 08/08/2011  . Crohn's disease of stomach and colon 05/17/2011  . GERD (gastroesophageal reflux disease) 04/21/2011    Past Medical History  Diagnosis Date  . GERD (gastroesophageal reflux disease)   . HTN (hypertension)   . Gastric ulcer     antral  . Biliary dyskinesia   . Anxiety   . Obesity   . History of migraine headaches   . Allergy     trees/ grasses/ animals/dust/mold  . Crohn's disease     Stomach, terminal ileum, cecum  . Kidney stone 09/20/2012  . Colloid thyroid nodule     Past Surgical History  Procedure Laterality Date  . Cholecystectomy  2011    Rosenbower  . Vasectomy      bilateral w/lysis of penile adhesions  . Esophagogastroduodenoscopy  05/04/11, 07/26/11    granulomatous gastritis - Crohn's  . Multiple tooth extractions  2005  . Colonoscopy  07/26/11    Crohn's  colitis, ileitis  . Shoulder surgery      right  . Joint replacement    . Flexible sigmoidoscopy      History  Substance Use Topics  . Smoking status: Current Every Day Smoker -- 1.00 packs/day for 14 years    Types: Cigarettes  . Smokeless tobacco: Former Systems developer     Comment: Counseling sheet given in exam room   . Alcohol Use: No    Family History  Problem Relation Age of Onset  . Colon polyps Father   . Diabetes Father   . Hypertension Father   . Colon cancer Neg Hx   . Rectal cancer Neg Hx   . Stomach cancer Neg Hx   . Esophageal cancer Neg Hx   . Hypertension Mother   . Asthma Mother   . Heart disease Maternal Grandmother     Allergies  Allergen Reactions  . Zithromax [Azithromycin] Hypertension    Medication list has been reviewed and updated.  Current Outpatient Prescriptions on File Prior to Visit  Medication Sig Dispense Refill  . cyclobenzaprine (FLEXERIL) 10 MG tablet Take 1 tablet (10 mg total) by mouth 3 (three) times daily as needed.  30 tablet  11  . DEXILANT 60 MG capsule TAKE ONE CAPSULE BY MOUTH EVERY  DAY RECHECK BEFORE OUT  90 capsule  0  . diphenoxylate-atropine (LOMOTIL) 2.5-0.025 MG per tablet Take 1-2 po q 6 hours prn diarrhea  90 tablet  0  . HUMIRA PEN 40 MG/0.8ML injection INJECT 1 PEN              SUBCUTANEOUSLY EVERY      OTHER WEEK  2 each  5  . HYDROcodone-acetaminophen (NORCO) 5-325 MG per tablet Take 1 tablet by mouth every 6 (six) hours as needed.  20 tablet  0  . lisinopril-hydrochlorothiazide (PRINZIDE,ZESTORETIC) 20-12.5 MG per tablet TAKE 2 TABLETS BY MOUTH DAILY.  180 tablet  1  . mercaptopurine (PURINETHOL) 50 MG tablet Take 3 tablets (150 mg total) by mouth daily. Give on an empty stomach 1 hour before or 2 hours after meals. Caution: Chemotherapy.  90 tablet  3  . ondansetron (ZOFRAN-ODT) 8 MG disintegrating tablet Take 1 tablet (8 mg total) by mouth every 8 (eight) hours as needed for nausea.  30 tablet  3  . saccharomyces  boulardii (FLORASTOR) 250 MG capsule Take 1 capsule (250 mg total) by mouth 2 (two) times daily.  60 capsule  0  . topiramate (TOPAMAX) 25 MG tablet Take 3 tablets (75 mg total) by mouth at bedtime.  270 tablet  2  . verapamil (COVERA HS) 180 MG (CO) 24 hr tablet Take 2 tablets (360 mg total) by mouth daily.  180 tablet  1   No current facility-administered medications on file prior to visit.    Review of Systems:  As per HPI- otherwise negative. He denies any sx- no penile discharge or lesions, no pain.  Physical Examination: Filed Vitals:   09/19/13 1738  BP: 148/86  Pulse: 72  Temp: 98.1 F (36.7 C)  Resp: 16   Filed Vitals:   09/19/13 1738  Height: 6' 1"  (1.854 m)  Weight: 324 lb (146.965 kg)   Body mass index is 42.76 kg/(m^2). Ideal Body Weight: Weight in (lb) to have BMI = 25: 189.1  GEN: WDWN, NAD, Non-toxic, A & O x 3, morbid obestiy HEENT: Atraumatic, Normocephalic. Neck supple. No masses, No LAD. Ears and Nose: No external deformity. CV: RRR, No M/G/R. No JVD. No thrill. No extra heart sounds. PULM: CTA B, no wheezes, crackles, rhonchi. No retractions. No resp. distress. No accessory muscle use. EXTR: No c/c/e NEURO Normal gait.  PSYCH: Normally interactive. Conversant. Not depressed or anxious appearing.  Calm demeanor.    Assessment and Plan: Routine screening for STI (sexually transmitted infection) - Plan: GC/Chlamydia Probe Amp  HTN (hypertension)  Bp is under better control.  Continue medications.   Will do uriprobe today.  He is on humira and has Crohn disease- await results prior to tx.    Signed Lamar Blinks, MD  Called 3/23: received his uriprobe. It is negative.  However given his wife's recent positive result he would like to be treated just to be on the safe side.  Treat with doxy 100 BID for 7 days as he is allergic to azithromycin

## 2013-09-19 NOTE — Patient Instructions (Signed)
I will be in touch with your labs.  No sex until we have your results and you are treated if necessary.  Continue to take your BP medications.

## 2013-09-19 NOTE — Telephone Encounter (Signed)
Wife recently diagnosed with chlamydia.  Patient is asymptomatic.  She is asking if he needs to be screened.  Per [name] patient needs to go to an Urgent Care and be tested.  They should call with the results.

## 2013-09-22 ENCOUNTER — Encounter: Payer: Self-pay | Admitting: Family Medicine

## 2013-09-22 LAB — GC/CHLAMYDIA PROBE AMP
CT PROBE, AMP APTIMA: NEGATIVE
GC Probe RNA: NEGATIVE

## 2013-09-22 MED ORDER — DOXYCYCLINE HYCLATE 100 MG PO TABS: 100.0000 mg | ORAL_TABLET | Freq: Two times a day (BID) | ORAL | Status: DC

## 2013-09-22 NOTE — Addendum Note (Signed)
Addended by: Lamar Blinks C on: 09/22/2013 08:19 PM   Modules accepted: Orders

## 2013-09-29 ENCOUNTER — Encounter: Payer: 59 | Admitting: Family Medicine

## 2013-10-01 ENCOUNTER — Other Ambulatory Visit: Payer: Self-pay

## 2013-10-01 ENCOUNTER — Telehealth: Payer: Self-pay

## 2013-10-01 DIAGNOSIS — K5090 Crohn's disease, unspecified, without complications: Secondary | ICD-10-CM

## 2013-10-01 NOTE — Telephone Encounter (Signed)
Patient advised to come for CBC and hepatic routine labs.

## 2013-10-01 NOTE — Telephone Encounter (Signed)
Message copied by Marlon Pel on Wed Oct 01, 2013  9:50 AM ------      Message from: Marlon Pel      Created: Tue Aug 05, 2013 10:53 AM       Needs labs- Carlean Purl reviewed 1/9 labs and changed it to April ------

## 2013-10-20 ENCOUNTER — Other Ambulatory Visit (INDEPENDENT_AMBULATORY_CARE_PROVIDER_SITE_OTHER): Payer: 59

## 2013-10-20 DIAGNOSIS — K5090 Crohn's disease, unspecified, without complications: Secondary | ICD-10-CM

## 2013-10-20 LAB — HEPATIC FUNCTION PANEL
ALK PHOS: 98 U/L (ref 39–117)
ALT: 41 U/L (ref 0–53)
AST: 34 U/L (ref 0–37)
Albumin: 3.8 g/dL (ref 3.5–5.2)
BILIRUBIN DIRECT: 0.1 mg/dL (ref 0.0–0.3)
BILIRUBIN TOTAL: 0.7 mg/dL (ref 0.3–1.2)
TOTAL PROTEIN: 7.9 g/dL (ref 6.0–8.3)

## 2013-10-20 LAB — CBC WITH DIFFERENTIAL/PLATELET
BASOS ABS: 0 10*3/uL (ref 0.0–0.1)
Basophils Relative: 0.5 % (ref 0.0–3.0)
EOS ABS: 0.2 10*3/uL (ref 0.0–0.7)
Eosinophils Relative: 3.3 % (ref 0.0–5.0)
HCT: 46.5 % (ref 39.0–52.0)
Hemoglobin: 15.8 g/dL (ref 13.0–17.0)
Lymphocytes Relative: 42.2 % (ref 12.0–46.0)
Lymphs Abs: 2.5 10*3/uL (ref 0.7–4.0)
MCHC: 33.9 g/dL (ref 30.0–36.0)
MCV: 85.4 fl (ref 78.0–100.0)
MONO ABS: 0.9 10*3/uL (ref 0.1–1.0)
Monocytes Relative: 14.8 % — ABNORMAL HIGH (ref 3.0–12.0)
NEUTROS PCT: 39.2 % — AB (ref 43.0–77.0)
Neutro Abs: 2.3 10*3/uL (ref 1.4–7.7)
PLATELETS: 203 10*3/uL (ref 150.0–400.0)
RBC: 5.44 Mil/uL (ref 4.22–5.81)
RDW: 13.9 % (ref 11.5–14.6)
WBC: 5.9 10*3/uL (ref 4.5–10.5)

## 2013-10-21 ENCOUNTER — Other Ambulatory Visit: Payer: Self-pay

## 2013-10-21 ENCOUNTER — Encounter (INDEPENDENT_AMBULATORY_CARE_PROVIDER_SITE_OTHER): Payer: Self-pay | Admitting: Surgery

## 2013-10-21 ENCOUNTER — Ambulatory Visit (INDEPENDENT_AMBULATORY_CARE_PROVIDER_SITE_OTHER): Payer: 59 | Admitting: Surgery

## 2013-10-21 VITALS — BP 142/84 | HR 80 | Temp 97.6°F | Resp 16 | Ht 73.0 in | Wt 321.0 lb

## 2013-10-21 DIAGNOSIS — K5090 Crohn's disease, unspecified, without complications: Secondary | ICD-10-CM

## 2013-10-21 DIAGNOSIS — E041 Nontoxic single thyroid nodule: Secondary | ICD-10-CM

## 2013-10-21 NOTE — Patient Instructions (Signed)
WILL CHECK U/S  AND BLOOD WORK TO ENSURE STABILITY.  IF U/S UNCHANGED,  NO FURTHER FOLLOW UP NEEDED.

## 2013-10-21 NOTE — Progress Notes (Signed)
Patient ID: Craig Guzman, male   DOB: June 25, 1971, 43 y.o.   MRN: [id number]  Chief Complaint  Patient presents with  . Follow-up    LTFU    HPI Craig Guzman is a 43 y.o. male.  Patient sent request of Dr.Auvbere for  right thyroid nodule. He had some tenderness in his thyroid gland. Ultrasound showed a 3.5 cm cystic lesion right thyroid lobe and multiple other sub centimeter lesions. The 3.5 centimeters mass was aspirated with benign findings. The cyst appeared to resolve. He is start therapy for his Crohn's disease and he is sent in consultation for this cyst. No history of hyper or hypothyroidism. Denies any significant neck pain and has had minimal hoarseness yesterday but otherwise has felt well. Pt here for 6 month follow up.  On Humara for Crohn's. HPI  Past Medical History  Diagnosis Date  . GERD (gastroesophageal reflux disease)   . HTN (hypertension)   . Gastric ulcer     antral  . Biliary dyskinesia   . Anxiety   . Obesity   . History of migraine headaches   . Allergy     trees/ grasses/ animals/dust/mold  . Crohn's disease     Stomach, terminal ileum, cecum  . Kidney stone 09/20/2012  . Colloid thyroid nodule     Past Surgical History  Procedure Laterality Date  . Cholecystectomy  2011    [place]  . Vasectomy      bilateral w/lysis of penile adhesions  . Esophagogastroduodenoscopy  05/04/11, 07/26/11    granulomatous gastritis - Crohn's  . Multiple tooth extractions  2005  . Colonoscopy  07/26/11    Crohn's colitis, ileitis  . Shoulder surgery      right  . Joint replacement    . Flexible sigmoidoscopy      Family History  Problem Relation Age of Onset  . Colon polyps Father   . Diabetes Father   . Hypertension Father   . Colon cancer Neg Hx   . Rectal cancer Neg Hx   . Stomach cancer Neg Hx   . Esophageal cancer Neg Hx   . Hypertension Mother   . Asthma Mother   . Heart disease Maternal Grandmother     Social History History  Substance Use  Topics  . Smoking status: Current Every Day Smoker -- 1.00 packs/day for 14 years    Types: Cigarettes  . Smokeless tobacco: Former Systems developer     Comment: Counseling sheet given in exam room   . Alcohol Use: No    Allergies  Allergen Reactions  . Zithromax [Azithromycin] Hypertension    Current Outpatient Prescriptions  Medication Sig Dispense Refill  . cyclobenzaprine (FLEXERIL) 10 MG tablet Take 1 tablet (10 mg total) by mouth 3 (three) times daily as needed.  30 tablet  11  . DEXILANT 60 MG capsule TAKE ONE CAPSULE BY MOUTH EVERY DAY RECHECK BEFORE OUT  90 capsule  0  . diphenoxylate-atropine (LOMOTIL) 2.5-0.025 MG per tablet Take 1-2 po q 6 hours prn diarrhea  90 tablet  0  . doxycycline (VIBRA-TABS) 100 MG tablet Take 1 tablet (100 mg total) by mouth 2 (two) times daily.  14 tablet  0  . HUMIRA PEN 40 MG/0.8ML injection INJECT 1 PEN              SUBCUTANEOUSLY EVERY      OTHER WEEK  2 each  5  . HYDROcodone-acetaminophen (NORCO) 5-325 MG per tablet Take 1 tablet by mouth every  6 (six) hours as needed.  20 tablet  0  . lisinopril-hydrochlorothiazide (PRINZIDE,ZESTORETIC) 20-12.5 MG per tablet TAKE 2 TABLETS BY MOUTH DAILY.  180 tablet  1  . mercaptopurine (PURINETHOL) 50 MG tablet Take 3 tablets (150 mg total) by mouth daily. Give on an empty stomach 1 hour before or 2 hours after meals. Caution: Chemotherapy.  90 tablet  3  . ondansetron (ZOFRAN-ODT) 8 MG disintegrating tablet Take 1 tablet (8 mg total) by mouth every 8 (eight) hours as needed for nausea.  30 tablet  3  . saccharomyces boulardii (FLORASTOR) 250 MG capsule Take 1 capsule (250 mg total) by mouth 2 (two) times daily.  60 capsule  0  . topiramate (TOPAMAX) 25 MG tablet Take 3 tablets (75 mg total) by mouth at bedtime.  270 tablet  2  . verapamil (COVERA HS) 180 MG (CO) 24 hr tablet Take 2 tablets (360 mg total) by mouth daily.  180 tablet  1   No current facility-administered medications for this visit.    Review of  Systems Review of Systems  Constitutional: Negative.   HENT: Negative.   Eyes: Negative.   Respiratory: Negative.   Cardiovascular: Negative.   Gastrointestinal: Positive for abdominal pain and blood in stool.  Endocrine: Negative.   Genitourinary: Negative.   Musculoskeletal: Negative.   Skin: Negative.   Neurological: Negative.   Hematological: Negative.   Psychiatric/Behavioral: Negative.     Blood pressure 142/84, pulse 80, temperature 97.6 F (36.4 C), temperature source Temporal, resp. rate 16, height 6' 1"  (1.854 m), weight 321 lb (145.605 kg).  Physical Exam Physical Exam  Constitutional: He is oriented to person, place, and time. He appears well-developed and well-nourished.  HENT:  Head: Normocephalic and atraumatic.  Eyes: EOM are normal. Pupils are equal, round, and reactive to light.  Neck: Trachea normal, normal range of motion and phonation normal. Neck supple. No tracheal tenderness present. No tracheal deviation present. Thyromegaly present. No mass present.  Cardiovascular: Normal rate.   Pulmonary/Chest: Effort normal.  Abdominal: Soft.  Lymphadenopathy:    He has no cervical adenopathy.  Neurological: He is alert and oriented to person, place, and time.  Skin: Skin is warm and dry.  Psychiatric: He has a normal mood and affect. His behavior is normal. Judgment and thought content normal.    Data Reviewed CLINICAL DATA: Followup thyroid nodule.  EXAM:  THYROID ULTRASOUND  TECHNIQUE:  Ultrasound examination of the thyroid gland and adjacent soft  tissues was performed.  COMPARISON: 10/08/2012.  FINDINGS:  Right thyroid lobe  Measurements: 5.1 cm x 1.7 cm x 2.2 cm. The gland demonstrates a  diffusely heterogeneous echotexture. Small hypoechoic nodule in the  posterior midpole measures 9.3 mm in greatest dimension. It contains  central calcification. There is a 4 mm cystic nodule along the upper  pole.  Left thyroid lobe  Measurements: 4.8 cm x 1.7  cm x 2.5 cm. The gland is diffusely  heterogeneous in its echotexture. There are several small nodules.  These are all hypoechoic or cystic. There is a 5.7 mm nodule that is  more complex with small internal calcifications. This lies in the  posterior midpole. A 5.6 mm cystic nodule lies in the lower pole.  Isthmus  Thickness: 9 mm. 7.6 mm heterogeneous hypoechoic nodule lies along  the right aspect of the isthmus.  Lymphadenopathy  There are scattered prominent lymph nodes, the largest on the left  measuring 9 mm in short axis.  IMPRESSION:  1. The  large complex cystic nodule seen in the right lobe on the  prior study is no longer evident. It was biopsied on 10/15/12. The  hypoechoic nodule with small calcifications which now lies in the  midpole may reflect the residual of that larger previous nodule.  2. Multiple small nodules are evident as described above. No nodule  is larger than 9 mm in greatest dimension. A nodule along the right  isthmus was not evident on the prior study.  3. Prominent to slightly enlarged lymph nodes, the largest measuring  9 mm in short axis. Only 1 small lymph node is present on the prior  exam, suggesting that the lymph nodes may have increased in the  interval.  4. Despite the small size of the thyroid nodules, in presence of  prominent to slightly enlarged lymph nodes indicates continued close  surveillance.  Assessment    Thyroid nodule with benign FNA favoring colloid nodule    Plan   stable exam.  Repeat U/S.  If stable no further follow up needed.        Jaelee Laughter A. Denesha Brouse 10/21/2013, 5:17 PM

## 2013-10-23 ENCOUNTER — Telehealth (INDEPENDENT_AMBULATORY_CARE_PROVIDER_SITE_OTHER): Payer: Self-pay

## 2013-10-23 ENCOUNTER — Ambulatory Visit
Admission: RE | Admit: 2013-10-23 | Discharge: 2013-10-23 | Disposition: A | Discharge status: Home / Self Care | Payer: 59 | Source: Ambulatory Visit | Attending: Surgery | Admitting: Surgery

## 2013-10-23 DIAGNOSIS — E041 Nontoxic single thyroid nodule: Secondary | ICD-10-CM

## 2013-10-23 LAB — TSH: TSH: 1.513 u[IU]/mL (ref 0.350–4.500)

## 2013-10-23 LAB — T4: T4, Total: 8 ug/dL (ref 5.0–12.5)

## 2013-10-23 NOTE — Telephone Encounter (Signed)
LMOM> [place] stable, No further follow up needed in our office.

## 2013-10-23 NOTE — Telephone Encounter (Signed)
Patient return call to office- patient given [place] results (Stable) and made aware No further follow up needed in our office.

## 2013-10-23 NOTE — Telephone Encounter (Signed)
Message copied by Carlene Coria on Thu Oct 23, 2013  3:46 PM ------      Message from: Erroll Luna A      Created: Thu Oct 23, 2013  1:56 PM       Looks good no further follow up needed. ------

## 2013-10-27 ENCOUNTER — Ambulatory Visit (INDEPENDENT_AMBULATORY_CARE_PROVIDER_SITE_OTHER): Payer: 59 | Admitting: Family Medicine

## 2013-10-27 ENCOUNTER — Encounter: Payer: Self-pay | Admitting: Family Medicine

## 2013-10-27 VITALS — BP 140/92 | HR 78 | Temp 98.7°F | Resp 16 | Ht 72.5 in | Wt 318.0 lb

## 2013-10-27 DIAGNOSIS — Z0000 Encounter for general adult medical examination without abnormal findings: Secondary | ICD-10-CM

## 2013-10-27 DIAGNOSIS — E669 Obesity, unspecified: Secondary | ICD-10-CM

## 2013-10-27 DIAGNOSIS — I10 Essential (primary) hypertension: Secondary | ICD-10-CM

## 2013-10-27 LAB — POCT URINALYSIS DIPSTICK
Bilirubin, UA: NEGATIVE
Blood, UA: NEGATIVE
GLUCOSE UA: NEGATIVE
LEUKOCYTES UA: NEGATIVE
NITRITE UA: NEGATIVE
SPEC GRAV UA: 1.025
UROBILINOGEN UA: 1
pH, UA: 7

## 2013-10-27 NOTE — Patient Instructions (Addendum)
Keep a record of your blood pressures outside of the office and bring them to the next office visit - if they are remaining above 140/90 over the next month or two - we may need to adjust your medicines.  Work on walking for exercise - 150 minutes per week, and watching food choices for weight loss.  Call me before your medicines run out - recheck in 6 months.   Keeping you healthy  Get these tests  Blood pressure- Have your blood pressure checked once a year by your healthcare provider.  Normal blood pressure is 120/80.  Weight- Have your body mass index (BMI) calculated to screen for obesity.  BMI is a measure of body fat based on height and weight. You can also calculate your own BMI at [employer]it.  Cholesterol- Have your cholesterol checked regularly starting at age 20, sooner may be necessary if you have diabetes, high blood pressure, if a family member developed heart diseases at an early age or if you smoke.   Chlamydia, HIV, and other sexual transmitted disease- Get screened each year until the age of 76 then within three months of each new sexual partner.  Diabetes- Have your blood sugar checked regularly if you have high blood pressure, high cholesterol, a family history of diabetes or if you are overweight.  Get these vaccines  Flu shot- Every fall.  Tetanus shot- Every 10 years.  Menactra- Single dose; prevents meningitis.  Take these steps  Don't smoke- If you do smoke, ask your healthcare provider about quitting. For tips on how to quit, go to www.smokefree.gov or call 1-800-QUIT-NOW.  Be physically active- Exercise 5 days a week for at least 30 minutes.  If you are not already physically active start slow and gradually work up to 30 minutes of moderate physical activity.  Examples of moderate activity include walking briskly, mowing the yard, dancing, swimming bicycling, etc.  Eat a healthy diet- Eat a variety of healthy foods such as fruits, vegetables,  low fat milk, low fat cheese, yogurt, lean meats, poultry, fish, beans, tofu, etc.  For more information on healthy eating, go to www.thenutritionsource.org  Drink alcohol in moderation- Limit alcohol intake two drinks or less a day.  Never drink and drive.  Dentist- Brush and floss teeth twice daily; visit your dentis twice a year.  Depression-Your emotional health is as important as your physical health.  If you're feeling down, losing interest in things you normally enjoy please talk with your healthcare provider.  Gun Safety- If you keep a gun in your home, keep it unloaded and with the safety lock on.  Bullets should be stored separately.  Helmet use- Always wear a helmet when riding a motorcycle, bicycle, rollerblading or skateboarding.  Safe sex- If you may be exposed to a sexually transmitted infection, use a condom  Seat belts- Seat bels can save your life; always wear one.  Smoke/Carbon Monoxide detectors- These detectors need to be installed on the appropriate level of your home.  Replace batteries at least once a year.  Skin Cancer- When out in the sun, cover up and use sunscreen SPF 15 or higher. Violence- If anyone is threatening or hurting you, please tell your healthcare provider.

## 2013-10-27 NOTE — Progress Notes (Signed)
   Subjective:    Patient ID: Craig Guzman, male    DOB: 03-26-1971, 43 y.o.   MRN: [id number]  HPI    Review of Systems  Constitutional: Negative.   Eyes: Negative.   Respiratory: Negative.   Cardiovascular: Negative.   Gastrointestinal: Positive for nausea, vomiting, abdominal pain, diarrhea and constipation.  Endocrine: Negative.   Genitourinary: Negative.   Musculoskeletal: Negative.   Skin: Negative.   Allergic/Immunologic: Positive for environmental allergies.  Neurological: Positive for headaches.  Hematological: Negative.   Psychiatric/Behavioral: Negative.        Objective:   Physical Exam        Assessment & Plan:

## 2013-10-27 NOTE — Progress Notes (Addendum)
Subjective:  This chart was scribed for Craig Ray, MD by [name], Scribe.  This patient was seen in [facility] and the patient's care was started at 4:36 PM.   Patient ID: Craig Guzman, male    DOB: 21-Sep-1970, 43 y.o.   MRN: [id number]  HPI  Craig Guzman is a 43 y.o. male PCP: [name], MD   Pt presents for an annual exam and medication refills.  Multiple medical problems per problem list, including migraine headaches, Crohn's disease, hypertension, GERD, and gastroparesis (thought to be from Crohn's).    Hypertension: Last office visit with me on 08/25/13.  He had been out of his verapamil for the past week but had been taking his lisinopril-HCTZ 2 pills/day.  BP at that visit was 178/118.  He did have a CMET and lipid panel performed on 2/23.  GFR was greater than 89, remainder of CMP normal.  Total cholesterol was 158, LDL 88, HDL 30.  He had been checking BP at home but states his cuff is too small for his arm.  He was getting readings around 160/120, with one reading of 198/135 which he thinks was due to the cuff being too small.  At [facility] he has been getting readings of around 140-142/89-92.  He has had issues with his diet due to his GI issues.  He states he only eats about 1x/day.  Physical: Tetanus - Last Tdap March 2013. Dentist - He had a dental appointment today which was cancelled, but he will return in 3 days Ophtho - Last saw ophthalmologist last year and plans to make an appointment next week. Shingles - States he recently received vaccine Pneumonia - Received pneumovax March 2013 Hepatitis A - Received vaccination March 2013 Prostate cancer screening - Normal PSA at 0.20 in March 2013.  He denies family h/o prostate cancer. Exercise - He has been walking some, but he has to moderate this because if he eats while walking he vomits.  Recent possible STD exposure:  Pt was recently treated for possible exposure to chlamydia in March.  He has had no  symptoms since then including pain in testicles or penis, discharge, or any other GU symptoms.  He has not been with any new partners.  GI Issues: Received stomach emptying test at [facility] last week.  Will receive colonoscopy and endoscopy next month.  GI at [facility] is [name] and he is also seen at [facility].    Migraine headaches: Occur after vomiting and GI has determines that emesis is the likely cause of his headaches.  He is not seen by a neurologist for this.  He has had no headache in the past 2 1/2 weeks.  Rash: He reports a rash on both ears which he is wondering whether may be a side effect of Humira.  He describes the rash as "itchy bumps" behind the ears.  He has not attempted to treat rash.  "Warts" on left and right 3rd fingers: Pt states these have been present for several years.      Patient Active Problem List   Diagnosis Date Noted  . Morbid obesity 09/19/2013  . Diarrhea 07/10/2013  . 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 Hypertension 11/26/2012  . Gastroparesis 12/08/2011  . Migraine headache 12/08/2011  . Long-term use of immunosuppressant medication- 6 MPand Humira 08/08/2011  . Crohn's disease of stomach and colon 05/17/2011  . GERD (gastroesophageal reflux disease) 04/21/2011    Past Medical History  Diagnosis Date  .  GERD (gastroesophageal reflux disease)   . HTN (hypertension)   . Gastric ulcer     antral  . Biliary dyskinesia   . Anxiety   . Obesity   . History of migraine headaches   . Allergy     trees/ grasses/ animals/dust/mold  . Crohn's disease     Stomach, terminal ileum, cecum  . Kidney stone 09/20/2012  . Colloid thyroid nodule     Past Surgical History  Procedure Laterality Date  . Cholecystectomy  2011    [place]  . Vasectomy      bilateral w/lysis of penile adhesions  . Esophagogastroduodenoscopy  05/04/11, 07/26/11    granulomatous gastritis - Crohn's  . Multiple tooth extractions  2005  . Colonoscopy  07/26/11    Crohn's  colitis, ileitis  . Shoulder surgery      right  . Joint replacement    . Flexible sigmoidoscopy      Allergies  Allergen Reactions  . Zithromax [Azithromycin] Hypertension    Prior to Admission medications   Medication Sig Start Date End Date Taking? Authorizing Provider  cyclobenzaprine (FLEXERIL) 10 MG tablet Take 1 tablet (10 mg total) by mouth 3 (three) times daily as needed. 11/26/12   Robyn Haber, MD  DEXILANT 60 MG capsule TAKE ONE CAPSULE BY MOUTH EVERY DAY RECHECK BEFORE OUT 11/11/11   Rise Mu, PA-C  diphenoxylate-atropine (LOMOTIL) 2.5-0.025 MG per tablet Take 1-2 po q 6 hours prn diarrhea 07/11/13   Gatha Mayer, MD  doxycycline (VIBRA-TABS) 100 MG tablet Take 1 tablet (100 mg total) by mouth 2 (two) times daily. 09/22/13   Darreld Mclean, MD  HUMIRA PEN 40 MG/0.8ML injection INJECT 1 PEN              SUBCUTANEOUSLY EVERY      OTHER WEEK 08/22/13   Gatha Mayer, MD  HYDROcodone-acetaminophen [facility]) 5-325 MG per tablet Take 1 tablet by mouth every 6 (six) hours as needed. 08/25/13   [name], MD  lisinopril-hydrochlorothiazide (PRINZIDE,ZESTORETIC) 20-12.5 MG per tablet TAKE 2 TABLETS BY MOUTH DAILY. 08/25/13   [name], MD  mercaptopurine (PURINETHOL) 50 MG tablet Take 3 tablets (150 mg total) by mouth daily. Give on an empty stomach 1 hour before or 2 hours after meals. Caution: Chemotherapy. 09/25/12   Gatha Mayer, MD  ondansetron (ZOFRAN-ODT) 8 MG disintegrating tablet Take 1 tablet (8 mg total) by mouth every 8 (eight) hours as needed for nausea. 12/24/12   Gatha Mayer, MD  saccharomyces boulardii (FLORASTOR) 250 MG capsule Take 1 capsule (250 mg total) by mouth 2 (two) times daily. 08/08/13   Gatha Mayer, MD  topiramate (TOPAMAX) 25 MG tablet Take 3 tablets (75 mg total) by mouth at bedtime. 03/20/13   Robyn Haber, MD  verapamil (COVERA HS) 180 MG (CO) 24 hr tablet Take 2 tablets (360 mg total) by mouth daily. 08/25/13   [name], MD     History   Social History  . Marital Status: Married    Spouse Name: N/A    Number of Children: 3  . Years of Education: N/A   Occupational History  . [employer]   .     Social History Main Topics  . Smoking status: Current Every Day Smoker -- 1.00 packs/day for 14 years    Types: Cigarettes  . Smokeless tobacco: Former Systems developer     Comment: Counseling sheet given in exam room   . Alcohol Use:  No  . Drug Use: No  . Sexual Activity: Yes   Other Topics Concern  . Not on file   Social History Narrative   Married. Education: [employer].      Review of Systems  Constitutional: Negative.   Eyes: Negative.   Respiratory: Negative.   Cardiovascular: Negative.   Gastrointestinal: Positive for nausea, vomiting, abdominal pain, diarrhea and constipation.  Endocrine: Negative.   Genitourinary: Negative.   Musculoskeletal: Negative.   Skin: Positive for rash.       "warts" on left and right 3rd fingers  Allergic/Immunologic: Positive for environmental allergies.  Neurological: Positive for headaches.  Hematological: Negative.   Psychiatric/Behavioral: Negative.         Objective:   Physical Exam  Skin:  Few small papules behind left and right ear.  Minimal excoriation Thickened skin over volar aspect of left 3rd finger PIP, with a central indentation     Filed Vitals:   10/27/13 1622  BP: 140/92  Pulse: 78  Temp: 98.7 F (37.1 C)  TempSrc: Oral  Resp: 16  Height: 6' 0.5" (1.842 m)  Weight: 318 lb (144.244 kg)  SpO2: 97%  Body mass index is 42.51 kg/(m^2).    Visual Acuity Screening   Right eye Left eye Both eyes  Without correction:     With correction: 20/15 20/15 20/15     Results for orders placed in visit on 10/27/13  POCT URINALYSIS DIPSTICK      Result Value Ref Range   Color, UA yellow     Clarity, UA clear     Glucose, UA neg     Bilirubin, UA neg     Ketones, UA trace     Spec Grav, UA 1.025     Blood, UA neg     pH, UA 7.0     Protein,  UA trace     Urobilinogen, UA 1.0     Nitrite, UA neg     Leukocytes, UA Negative          Assessment & Plan:  Domingos Riggi is a 43 y.o. male Routine general medical examination at a health care facility - Plan: POCT urinalysis dipstick  Anticipatory guidance given per handout below. No bloodwork today as reviewed recent labs from other providers. Discussed exercise/weight loss, but some challenges with his underlying Crohn's dz and gastroparesis. Goal of moderate intensity exercise 150 mins per week.   Unspecified essential hypertension - improved reading now that he is back on usual regimen. Plans on obtaining appropriate home BP cuff, and checking outside BP's. If still elevated, consider adding low dose spirinolactone based on current higher doses of other meds.    Obesity, unspecified - as above. Gaol of moderate intensity exercise or even walking as tolerated with careful food choices.   Recheck in 6 months. Sooner if BP remains elevated out of office.    No orders of the defined types were placed in this encounter.   Patient Instructions  Keep a record of your blood pressures outside of the office and bring them to the next office visit - if they are remaining above 140/90 over the next month or two - we may need to adjust your medicines.  Work on walking for exercise - 150 minutes per week, and watching food choices for weight loss.  Call me before your medicines run out - recheck in 6 months.   Keeping you healthy  Get these tests  Blood pressure- Have your blood pressure checked once  a year by your healthcare provider.  Normal blood pressure is 120/80.  Weight- Have your body mass index (BMI) calculated to screen for obesity.  BMI is a measure of body fat based on height and weight. You can also calculate your own BMI at GravelBags.it.  Cholesterol- Have your cholesterol checked regularly starting at age 56, sooner may be necessary if you have diabetes,  high blood pressure, if a family member developed heart diseases at an early age or if you smoke.   Chlamydia, HIV, and other sexual transmitted disease- Get screened each year until the age of 47 then within three months of each new sexual partner.  Diabetes- Have your blood sugar checked regularly if you have high blood pressure, high cholesterol, a family history of diabetes or if you are overweight.  Get these vaccines  Flu shot- Every fall.  Tetanus shot- Every 10 years.  Menactra- Single dose; prevents meningitis.  Take these steps  Don't smoke- If you do smoke, ask your healthcare provider about quitting. For tips on how to quit, go to www.smokefree.gov or call 1-800-QUIT-NOW.  Be physically active- Exercise 5 days a week for at least 30 minutes.  If you are not already physically active start slow and gradually work up to 30 minutes of moderate physical activity.  Examples of moderate activity include walking briskly, mowing the yard, dancing, swimming bicycling, etc.  Eat a healthy diet- Eat a variety of healthy foods such as fruits, vegetables, low fat milk, low fat cheese, yogurt, lean meats, poultry, fish, beans, tofu, etc.  For more information on healthy eating, go to www.thenutritionsource.org  Drink alcohol in moderation- Limit alcohol intake two drinks or less a day.  Never drink and drive.  Dentist- Brush and floss teeth twice daily; visit your dentis twice a year.  Depression-Your emotional health is as important as your physical health.  If you're feeling down, losing interest in things you normally enjoy please talk with your healthcare provider.  Gun Safety- If you keep a gun in your home, keep it unloaded and with the safety lock on.  Bullets should be stored separately.  Helmet use- Always wear a helmet when riding a motorcycle, bicycle, rollerblading or skateboarding.  Safe sex- If you may be exposed to a sexually transmitted infection, use a condom  Seat  belts- Seat bels can save your life; always wear one.  Smoke/Carbon Monoxide detectors- These detectors need to be installed on the appropriate level of your home.  Replace batteries at least once a year.  Skin Cancer- When out in the sun, cover up and use sunscreen SPF 15 or higher. Violence- If anyone is threatening or hurting you, please tell your healthcare provider.       I personally performed the services described in this documentation, which was scribed in my presence. The recorded information has been reviewed and considered, and addended by me as needed.

## 2013-12-30 ENCOUNTER — Telehealth: Payer: Self-pay | Admitting: Internal Medicine

## 2013-12-30 NOTE — Telephone Encounter (Signed)
Spoke with the spouse. The patient has recently had to work swing shifts (going from 1st shift to 3rd shift). Since starting this he has had headaches, GI upset and decreased appetite. He has also had nausea and vomiting. Stools are diarrhea but he does not report bloody stools. Craig Guzman has FMLA for these issues. He is asking for a letter from you stating the swing shifts likely cause an increase in his GI symptoms and could cause him missed time cfrom work. Can this letter be written or does he need an office visit to discuss? His referral to Dr Lacinda Axon at [facility] has been rescheduled to November.

## 2013-12-31 ENCOUNTER — Encounter: Payer: Self-pay | Admitting: Internal Medicine

## 2013-12-31 DIAGNOSIS — R110 Nausea: Secondary | ICD-10-CM | POA: Insufficient documentation

## 2013-12-31 NOTE — Telephone Encounter (Signed)
Spouse notified-she will come pick up the letter from the front desk-appointment scheduled for 03/02/14 at 3:45pm

## 2013-12-31 NOTE — Telephone Encounter (Signed)
Letter will be written - will get to you He needs to schedule a f/u me July or August

## 2014-02-11 ENCOUNTER — Telehealth: Payer: Self-pay | Admitting: Gastroenterology

## 2014-02-11 NOTE — Telephone Encounter (Signed)
He called tonight with several days of worse than usual diarrhea (usually 2-3 loose stools daily, now with 9 loose stools daily, non-bloody).  ABd pain but no distention.  Foul smelling belches, lots of gas.    Unclear what is causing all this, likely combination of crohns, gastroparesis.    He does not sound like he needs to go to ER  Patty, Can he be seen Thurs or Friday by MD or extender? He is expecting a call in AM on Thursday.  Thanks

## 2014-02-12 NOTE — Telephone Encounter (Signed)
The pt has had abd pain that doubles him over with very foul gas and diarrhea approx 9 times daily appt with Nevin Bloodgood for 02/13/14 130 pm he is aware

## 2014-02-13 ENCOUNTER — Ambulatory Visit: Payer: 59 | Admitting: Nurse Practitioner

## 2014-02-27 ENCOUNTER — Ambulatory Visit (INDEPENDENT_AMBULATORY_CARE_PROVIDER_SITE_OTHER): Payer: 59 | Admitting: Family Medicine

## 2014-02-27 ENCOUNTER — Ambulatory Visit (INDEPENDENT_AMBULATORY_CARE_PROVIDER_SITE_OTHER): Payer: 59

## 2014-02-27 VITALS — BP 138/86 | HR 79 | Temp 98.4°F | Resp 16 | Ht 72.0 in | Wt 311.0 lb

## 2014-02-27 DIAGNOSIS — R05 Cough: Secondary | ICD-10-CM

## 2014-02-27 DIAGNOSIS — J189 Pneumonia, unspecified organism: Secondary | ICD-10-CM

## 2014-02-27 DIAGNOSIS — D849 Immunodeficiency, unspecified: Secondary | ICD-10-CM

## 2014-02-27 DIAGNOSIS — D899 Disorder involving the immune mechanism, unspecified: Secondary | ICD-10-CM

## 2014-02-27 DIAGNOSIS — R059 Cough, unspecified: Secondary | ICD-10-CM

## 2014-02-27 DIAGNOSIS — R0602 Shortness of breath: Secondary | ICD-10-CM

## 2014-02-27 HISTORY — DX: Pneumonia, unspecified organism: J18.9

## 2014-02-27 LAB — POCT CBC
Granulocyte percent: 41.9 %G (ref 37–80)
HCT, POC: 49.3 % (ref 43.5–53.7)
HEMOGLOBIN: 15.7 g/dL (ref 14.1–18.1)
LYMPH, POC: 2.6 (ref 0.6–3.4)
MCH, POC: 27.5 pg (ref 27–31.2)
MCHC: 31.9 g/dL (ref 31.8–35.4)
MCV: 86.1 fL (ref 80–97)
MID (cbc): 0.9 (ref 0–0.9)
MPV: 8.5 fL (ref 0–99.8)
POC Granulocyte: 2.5 (ref 2–6.9)
POC LYMPH PERCENT: 43.5 %L (ref 10–50)
POC MID %: 14.6 % — AB (ref 0–12)
Platelet Count, POC: 192 10*3/uL (ref 142–424)
RBC: 5.72 M/uL (ref 4.69–6.13)
RDW, POC: 14.3 %
WBC: 6 10*3/uL (ref 4.6–10.2)

## 2014-02-27 MED ORDER — HYDROCOD POLST-CHLORPHEN POLST 10-8 MG/5ML PO LQCR: 5.0000 mL | Freq: Two times a day (BID) | ORAL | Status: DC | PRN

## 2014-02-27 MED ORDER — GUAIFENESIN ER 1200 MG PO TB12: 1.0000 | ORAL_TABLET | Freq: Two times a day (BID) | ORAL | Status: AC

## 2014-02-27 MED ORDER — DOXYCYCLINE HYCLATE 100 MG PO TABS: 100.0000 mg | ORAL_TABLET | Freq: Two times a day (BID) | ORAL | Status: DC

## 2014-02-27 NOTE — Progress Notes (Signed)
Subjective:    Patient ID: Craig Guzman, male    DOB: 1971-01-13, 43 y.o.   MRN: [id number]  HPI Pt presents to clinic with 6 day h/o cold symptoms and chest congestion and the feeling that he has mucus in his chest and throat but he cannot get it out.  He has some SOB yesterday but no wheezing.  He thinks his mother has asthma but it is allergy related from when they moved in [place].  He is a smoker.   Review of Systems  Constitutional: Positive for chills. Negative for fever.  HENT: Positive for congestion, postnasal drip (green) and sore throat. Negative for rhinorrhea.   Respiratory: Positive for cough and shortness of breath (yesterday with activty). Negative for wheezing.        No h/o asthma, smoker - 1/2 ppd for 10 years  Gastrointestinal: Negative.   Neurological: Positive for headaches.  Psychiatric/Behavioral: Positive for sleep disturbance (2nd to cough).        Objective:   Physical Exam  Vitals reviewed. Constitutional: He is oriented to person, place, and time. He appears well-developed and well-nourished.  HENT:  Head: Normocephalic and atraumatic.  Right Ear: Hearing, tympanic membrane, external ear and ear canal normal.  Left Ear: Hearing, tympanic membrane, external ear and ear canal normal.  Nose: Nose normal.  Mouth/Throat: Uvula is midline, oropharynx is clear and moist and mucous membranes are normal.  Cardiovascular: Normal rate and normal heart sounds.   No murmur heard. Pulmonary/Chest: Effort normal and breath sounds normal. He has no wheezes.  Wheezing sounding cough  Neurological: He is alert and oriented to person, place, and time.  Skin: Skin is warm and dry.  Psychiatric: He has a normal mood and affect. His behavior is normal. Judgment and thought content normal.    Results for orders placed in visit on 02/27/14  POCT CBC      Result Value Ref Range   WBC 6.0  4.6 - 10.2 K/uL   Lymph, poc 2.6  0.6 - 3.4   POC LYMPH PERCENT 43.5  10  - 50 %L   MID (cbc) 0.9  0 - 0.9   POC MID % 14.6 (*) 0 - 12 %M   POC Granulocyte 2.5  2 - 6.9   Granulocyte percent 41.9  37 - 80 %G   RBC 5.72  4.69 - 6.13 M/uL   Hemoglobin 15.7  14.1 - 18.1 g/dL   HCT, POC 49.3  43.5 - 53.7 %   MCV 86.1  80 - 97 fL   MCH, POC 27.5  27 - 31.2 pg   MCHC 31.9  31.8 - 35.4 g/dL   RDW, POC 14.3     Platelet Count, POC 192  142 - 424 K/uL   MPV 8.5  0 - 99.8 fL   [facility] reading (PRIMARY) by  Dr. Tamala Julian.  ? Patchy RML infiltrate     Assessment & Plan:  Cough - Plan: POCT CBC, DG Chest 2 View, chlorpheniramine-HYDROcodone (TUSSIONEX PENNKINETIC ER) 10-8 MG/5ML LQCR, Guaifenesin (MUCINEX MAXIMUM STRENGTH) 1200 MG TB12, doxycycline (VIBRA-TABS) 100 MG tablet  SOB (shortness of breath)  Immunocompromised patient  Pt has either a small or early PNA in the RML and with his immunocompromised status we will cover for atypicals which is the most likely type of bacteria.  We will treat symptoms and cover for infections.  Windell Hummingbird PA-C  [facility] 02/27/2014 8:23 PM

## 2014-02-28 NOTE — Progress Notes (Signed)
History and physical examinations reviewed.  CXR reviewed. Agree with assessment and plan.

## 2014-03-02 ENCOUNTER — Telehealth: Payer: Self-pay | Admitting: Family Medicine

## 2014-03-02 ENCOUNTER — Other Ambulatory Visit (INDEPENDENT_AMBULATORY_CARE_PROVIDER_SITE_OTHER): Payer: 59

## 2014-03-02 ENCOUNTER — Encounter: Payer: Self-pay | Admitting: Internal Medicine

## 2014-03-02 ENCOUNTER — Ambulatory Visit (INDEPENDENT_AMBULATORY_CARE_PROVIDER_SITE_OTHER): Payer: 59 | Admitting: Internal Medicine

## 2014-03-02 VITALS — BP 160/120 | HR 86 | Ht 72.0 in | Wt 315.4 lb

## 2014-03-02 DIAGNOSIS — Z79899 Other long term (current) drug therapy: Secondary | ICD-10-CM

## 2014-03-02 DIAGNOSIS — K219 Gastro-esophageal reflux disease without esophagitis: Secondary | ICD-10-CM

## 2014-03-02 DIAGNOSIS — K5090 Crohn's disease, unspecified, without complications: Secondary | ICD-10-CM

## 2014-03-02 DIAGNOSIS — K50919 Crohn's disease, unspecified, with unspecified complications: Secondary | ICD-10-CM

## 2014-03-02 DIAGNOSIS — Z7960 Long term (current) use of unspecified immunomodulators and immunosuppressants: Secondary | ICD-10-CM

## 2014-03-02 DIAGNOSIS — K3184 Gastroparesis: Secondary | ICD-10-CM

## 2014-03-02 LAB — HEPATIC FUNCTION PANEL
ALT: 57 U/L — ABNORMAL HIGH (ref 0–53)
AST: 46 U/L — ABNORMAL HIGH (ref 0–37)
Albumin: 3.5 g/dL (ref 3.5–5.2)
Alkaline Phosphatase: 113 U/L (ref 39–117)
BILIRUBIN TOTAL: 0.5 mg/dL (ref 0.2–1.2)
Bilirubin, Direct: 0.1 mg/dL (ref 0.0–0.3)
Total Protein: 7.6 g/dL (ref 6.0–8.3)

## 2014-03-02 LAB — CBC WITH DIFFERENTIAL/PLATELET
BASOS PCT: 0.4 % (ref 0.0–3.0)
Basophils Absolute: 0 10*3/uL (ref 0.0–0.1)
Eosinophils Absolute: 0.3 10*3/uL (ref 0.0–0.7)
Eosinophils Relative: 7.1 % — ABNORMAL HIGH (ref 0.0–5.0)
HCT: 44.7 % (ref 39.0–52.0)
Hemoglobin: 14.9 g/dL (ref 13.0–17.0)
Lymphocytes Relative: 41 % (ref 12.0–46.0)
Lymphs Abs: 1.8 10*3/uL (ref 0.7–4.0)
MCHC: 33.4 g/dL (ref 30.0–36.0)
MCV: 84.7 fl (ref 78.0–100.0)
MONO ABS: 1 10*3/uL (ref 0.1–1.0)
NEUTROS PCT: 27.5 % — AB (ref 43.0–77.0)
Neutro Abs: 1.2 10*3/uL — ABNORMAL LOW (ref 1.4–7.7)
Platelets: 194 10*3/uL (ref 150.0–400.0)
RBC: 5.28 Mil/uL (ref 4.22–5.81)
RDW: 14.1 % (ref 11.5–15.5)
WBC: 4.4 10*3/uL (ref 4.0–10.5)

## 2014-03-02 NOTE — Telephone Encounter (Signed)
LMOM to make f/u appt with Dr. Carlota Raspberry

## 2014-03-02 NOTE — Patient Instructions (Addendum)
Your physician has requested that you go to the basement for the following lab work before leaving today: Hepatic Function Panel  We want you to make sure and take your blood pressure medicine and follow up with [facility] about your elevated readings.  [name] wants you to re-start your Humira after your done with your pneumonia treatment.  Follow up with [place] in 6 months.   I appreciate the opportunity to care for you.

## 2014-03-02 NOTE — Assessment & Plan Note (Addendum)
Stay on PPI and Carafate. Weight loss is recommended. Dietitian referral will be made.

## 2014-03-02 NOTE — Assessment & Plan Note (Signed)
LFTs 

## 2014-03-02 NOTE — Telephone Encounter (Signed)
Message copied by Chinita Pester on Mon Mar 02, 2014  4:20 PM ------      Message from: Mancel Bale      Created: Fri Feb 27, 2014  8:19 PM       Recheck with Dr Carlota Raspberry as soon as he has an appt available ------

## 2014-03-02 NOTE — Assessment & Plan Note (Addendum)
Patient tells me that Dr.Koch does not think he has Crohn's disease. That is certainly possible, though biopsies in 2010 suggested that particularly in the EGD with the granulomatous gastritis. The patient and his wife are aware that I've been uncertain of this diagnosis but given his persistent symptoms I have treated him for inflammatory bowel disease and he doesn't seem to have responded at least some.  I will discuss with Dr. Derrill Kay, perhaps the biopsies from before should be reviewed. It may make sense to try to withdrawal the immunosuppressants rather than commit to lifetime of this.

## 2014-03-02 NOTE — Progress Notes (Addendum)
   Subjective:    Patient ID: Craig Guzman, male    DOB: Nov 01, 1970, 43 y.o.   MRN: [id number]  HPI Craig Guzman is here for followup. Dr. Derrill Kay has evaluated him, and thinks that his upper GI symptoms are probably related to GERD which is atypical. He is on lansoprazole 30 mg twice a day and Carafate and seems somewhat better. However he is back on third shift and this is upsetting him some and causing some more nausea and vomiting. Had recent diarrhea which resolved. He takes his blood pressure medicines at night before he goes to work. Blood pressure elevated today. No symptoms from that.  Review of Systems Suspected atypical pneumonia or bronchitis, being treated with doxycycline.    Objective:   Physical Exam General:  NAD, obese pleasant black male Eyes:   anicteric Lungs:  clear Heart:  S1S2 no rubs, murmurs or gallops Abdomen:  Obese, soft and nontender, BS+ Ext:   no edema    Data Reviewed:  [facility] GI notes, he had a four-hour gastric emptying study that showed a normal emptying so it is not thought that he has gastroparesis at this time.  The 24 hour pH impedance study off PPI showed normal acid reflux 5 of 8 episodes of nausea correlated with reflux events.  Esophageal manometry showed normal LES function and low amplitude peristaltic contractions with decreased bolus clearance  Gastric antrum biopsy showed chronic gastritis with intestinal metaplasia. No granulomas.  Colonoscopy was normal except for internal hemorrhoids  He does not have bacterial overgrowth per hydrogen breath test       Assessment & Plan:  Crohn's disease of stomach and colon-suspected Patient tells me that Dr.Koch does not think he has Crohn's disease. That is certainly possible, though biopsies in 2010 suggested that particularly in the EGD with the granulomatous gastritis. The patient and his wife are aware that I've been uncertain of this diagnosis but given his persistent symptoms I have  treated him for inflammatory bowel disease and he doesn't seem to have responded at least some.  I will discuss with Dr. Derrill Kay, perhaps the biopsies from before should be reviewed. It may make sense to try to withdrawal the immunosuppressants rather than commit to lifetime of this.  Long-term use of immunosuppressant medication- 6 MPand Humira LFT's   Gastroparesis??? 4 hour gastric emptying study now NL Could be that since granulomatous gastritis is Tx the gastric emptying is better   GERD (gastroesophageal reflux disease) Stay on PPI and Carafate. Weight loss is recommended. Dietitian referral will be made.   Cc: Milderd Meager, MD

## 2014-03-02 NOTE — Assessment & Plan Note (Signed)
4 hour gastric emptying study now NL Could be that since granulomatous gastritis is Tx the gastric emptying is better

## 2014-03-04 ENCOUNTER — Telehealth: Payer: Self-pay

## 2014-03-04 DIAGNOSIS — K2960 Other gastritis without bleeding: Secondary | ICD-10-CM

## 2014-03-04 NOTE — Telephone Encounter (Signed)
I have faxed a request to [facility] pathology to transfer the slides to [facility] to Dr. Reinaldo Meeker.

## 2014-03-04 NOTE — Telephone Encounter (Signed)
I have left a message at [facility] pathology with Meribeth Mattes to arrange transfer of pathology slides

## 2014-03-04 NOTE — Telephone Encounter (Signed)
Message copied by Marlon Pel on Wed Mar 04, 2014  3:23 PM ------      Message from: Silvano Rusk E      Created: Wed Mar 04, 2014  2:51 PM      Regarding: RE: outside pathology review       Forgot to say some key things            Want his pathology reviewd at [facility] by Dr. Blythe Stanford      ----- Message -----         From: Kellie Moor, RN         Sent: 03/04/2014   1:53 PM           To: Gatha Mayer, MD      Subject: RE: outside pathology review                             I am not following, what are you asking me to do??       ----- Message -----         From: Gatha Mayer, MD         Sent: 03/04/2014  12:22 PM           To: Kellie Moor, RN      Subject: outside pathology review                                 I need to have an outside pathology review of Craig Guzman's pathology (GI biopsies) from 2008 forward please                  Reason is to render an opinion on granulomatous gastritis              ------

## 2014-03-25 ENCOUNTER — Other Ambulatory Visit: Payer: Self-pay

## 2014-03-25 MED ORDER — MERCAPTOPURINE 50 MG PO TABS: 150.0000 mg | ORAL_TABLET | Freq: Every day | ORAL | Status: DC

## 2014-05-11 ENCOUNTER — Ambulatory Visit: Payer: Self-pay | Admitting: Family Medicine

## 2014-08-24 ENCOUNTER — Ambulatory Visit (INDEPENDENT_AMBULATORY_CARE_PROVIDER_SITE_OTHER): Payer: Self-pay | Admitting: Family Medicine

## 2014-08-24 VITALS — BP 128/82 | HR 74 | Temp 98.0°F | Resp 17 | Ht 72.0 in | Wt 296.0 lb

## 2014-08-24 DIAGNOSIS — G43011 Migraine without aura, intractable, with status migrainosus: Secondary | ICD-10-CM

## 2014-08-24 DIAGNOSIS — F17200 Nicotine dependence, unspecified, uncomplicated: Secondary | ICD-10-CM

## 2014-08-24 DIAGNOSIS — K2100 Gastro-esophageal reflux disease with esophagitis, without bleeding: Secondary | ICD-10-CM

## 2014-08-24 DIAGNOSIS — R197 Diarrhea, unspecified: Secondary | ICD-10-CM

## 2014-08-24 DIAGNOSIS — G43009 Migraine without aura, not intractable, without status migrainosus: Secondary | ICD-10-CM

## 2014-08-24 DIAGNOSIS — Z720 Tobacco use: Secondary | ICD-10-CM

## 2014-08-24 DIAGNOSIS — J322 Chronic ethmoidal sinusitis: Secondary | ICD-10-CM

## 2014-08-24 DIAGNOSIS — I10 Essential (primary) hypertension: Secondary | ICD-10-CM

## 2014-08-24 DIAGNOSIS — R002 Palpitations: Secondary | ICD-10-CM

## 2014-08-24 LAB — POCT CBC
GRANULOCYTE PERCENT: 48.5 % (ref 37–80)
HEMATOCRIT: 49.5 % (ref 43.5–53.7)
HEMOGLOBIN: 16.1 g/dL (ref 14.1–18.1)
Lymph, poc: 2.5 (ref 0.6–3.4)
MCH: 27.9 pg (ref 27–31.2)
MCHC: 32.5 g/dL (ref 31.8–35.4)
MCV: 85.8 fL (ref 80–97)
MID (cbc): 0.6 (ref 0–0.9)
MPV: 8.4 fL (ref 0–99.8)
POC GRANULOCYTE: 2.9 (ref 2–6.9)
POC LYMPH PERCENT: 41.3 %L (ref 10–50)
POC MID %: 10.2 % (ref 0–12)
Platelet Count, POC: 215 10*3/uL (ref 142–424)
RBC: 5.77 M/uL (ref 4.69–6.13)
RDW, POC: 15.2 %
WBC: 6 10*3/uL (ref 4.6–10.2)

## 2014-08-24 LAB — COMPREHENSIVE METABOLIC PANEL
ALK PHOS: 160 U/L — AB (ref 39–117)
ALT: 44 U/L (ref 0–53)
AST: 29 U/L (ref 0–37)
Albumin: 3.9 g/dL (ref 3.5–5.2)
BUN: 13 mg/dL (ref 6–23)
CO2: 24 mEq/L (ref 19–32)
Calcium: 9.4 mg/dL (ref 8.4–10.5)
Chloride: 106 mEq/L (ref 96–112)
Creat: 1.09 mg/dL (ref 0.50–1.35)
Glucose, Bld: 87 mg/dL (ref 70–99)
Potassium: 4 mEq/L (ref 3.5–5.3)
SODIUM: 138 meq/L (ref 135–145)
Total Bilirubin: 0.4 mg/dL (ref 0.2–1.2)
Total Protein: 7.9 g/dL (ref 6.0–8.3)

## 2014-08-24 MED ORDER — DIPHENOXYLATE-ATROPINE 2.5-0.025 MG PO TABS: ORAL_TABLET | ORAL | Status: DC

## 2014-08-24 MED ORDER — VERAPAMIL HCL 180 MG (CO) PO TB24: 360.0000 mg | ORAL_TABLET | Freq: Every day | ORAL | Status: DC

## 2014-08-24 MED ORDER — CEFDINIR 300 MG PO CAPS: 600.0000 mg | ORAL_CAPSULE | Freq: Every day | ORAL | Status: DC

## 2014-08-24 MED ORDER — FLUTICASONE PROPIONATE 50 MCG/ACT NA SUSP: 2.0000 | Freq: Every day | NASAL | Status: DC

## 2014-08-24 MED ORDER — LISINOPRIL-HYDROCHLOROTHIAZIDE 20-12.5 MG PO TABS: ORAL_TABLET | ORAL | Status: DC

## 2014-08-24 MED ORDER — TOPIRAMATE 25 MG PO TABS: 75.0000 mg | ORAL_TABLET | Freq: Every day | ORAL | Status: DC

## 2014-08-24 MED ORDER — LANSOPRAZOLE 30 MG PO CPDR: 30.0000 mg | DELAYED_RELEASE_CAPSULE | Freq: Two times a day (BID) | ORAL | Status: DC

## 2014-08-24 NOTE — Patient Instructions (Signed)
Seriously consider stopping smoking. Pick a focal point at a date about 4-6 weeks ahead of time. [place] out on your calendar, right down how many cigarettes you have a lot of for each day such as 20 a day for 3 days, 19 a day for 3 days, 18 a day for 3 days, etc. Set your goal at being down to 6 or 8 cigarettes by your quit date. On that day stop all cigarettes and puffiness out. If you need to you can to some nicotine gum or wear nicotine patches, but the most important thing is that you get off all cigarettes. If you do use the gum or patch, you will then need to wean off that.  Get daily exercise  Continue current medications  Use fluticasone nose spray 2 sprays each nostril twice daily for about 4 days, then once daily to open up your sinuses  Take the antibiotic, cefdinir, one pill twice daily for infection  Referral is being made to a cardiologist for you. Someone will contact you as to when that referral is for. If you have any major or prolonged episodes of the heart racing you should come in sooner or go to the emergency room if necessary.  Plan to follow-up with Dr. Nyoka Cowden in about June. Called to find out when he is in the office and come in and see him, or go ahead and make an appointment to see him at the [facility] next door.

## 2014-08-24 NOTE — Progress Notes (Signed)
Subjective: The patient is here for many things.  He needs refills on all  Medications.  He has been having problems with little episodes of anxiety. He feels his heart racing a way for a few minutes. The longest it is happened was for about 4 min. Last week. It is been happening for some time. It gets very anxious feeling. However these episodes do not come on In he is anxious. Rather they come on and make him feel anxious.He does not have any chest pain associated with them. No nausea or vomiting.   He has been having problems all so with head congestion for the past 10 days or so. He has been blowing some mucus out of his nose. He feels a lot of pressure behind his eyes which gives him a headache. He says this is how he has felt with sinus infections in the past  He has a history of migraine headaches. He rarely uses his Norco pain reliever, but does on occasion. The last prescription has lasted since a year ago. He also takes a muscle relaxant for this. He is on Topamax for this.  He has a history of reflux fairly well controlled with his medication for acid reduction, but he hasAbout run out of this.  He has hypertensive history and needs his medications for that.  Review of systems included the head congestin and pain. No breathing difficulty or call. Heart regular without murmur's.Occasional diarrhea for which he takes low modal  Objective: TMs normal. Eyes perrl. Throat clear. Has a little exudate on the left tonsil actually. The throat is a little sore. Chest clear. Heart regular. Abdomen soft and nontender. Extremities without edema.  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 Labs pending. rrewrote his medications. Please see orders. Refer to cardiology.  I suspect that  The palpitations are what is causing his anxious feeling, and he  probably will need a  Continue other medications. Treat him for the prolonged sinus infection.  (computer problems with this report)

## 2014-09-02 ENCOUNTER — Encounter: Payer: Self-pay | Admitting: Internal Medicine

## 2014-09-04 ENCOUNTER — Encounter: Payer: Self-pay | Admitting: Cardiology

## 2014-09-04 NOTE — Progress Notes (Signed)
Patient ID: Craig Guzman, male   DOB: [birth year], 44 y.o.   MRN: [id number]   Ranell, Skibinski    Date of visit:  09/04/2014 DOB:  1970-08-08    Age:  89 yrs. Medical record number:  [id number]     Account number:  [phone] Primary Care Provider: XMIWOE,HOZYY ____________________________ CURRENT DIAGNOSES  1. Palpitations  2. Essential hypertension  3. Crohn's Disease Of Small Intestine  4. Morbid (severe) obesity ____________________________ ALLERGIES  Zithromax, Intolerance-unknown ____________________________ MEDICATIONS  1. cyclobenzaprine 10 mg tablet, PRN  2. Lomotil 2.5 mg-0.025 mg tablet, PRN  3. Prevacid 30 mg capsule,delayed release, BID  4. lisinopril 20 mg-hydrochlorothiazide 12.5 mg tablet, 2 p.o. daily  5. Topamax 25 mg tablet, 3 qhs  6. verapamil ER 180 mg 24 hr capsule,extended release, 2 p.o. daily  7. cefdinir 300 mg capsule, BID ____________________________ CHIEF COMPLAINTS  Palpitations ____________________________ HISTORY OF PRESENT ILLNESS This 44 year old male is seen for evaluation of palpitations. The patient has a history of possible Crohn's disease and is on immunosuppressives for several years but evidently has been taken off of this now. He has been morbidly obese and has hypertension but states it is normally under good control. He denies angina and has no, orthopnea or edema. He recently has had some episodes of what he calls anxiety were his heart will race. The longest it has lasted has been about 10 minutes. He states that they occur about twice a month and associated with an anxious feeling although he has not checked his pulse during the episodes. He has been having these evidently for about 8 years. There are really no precipitating or aggravating factors. ____________________________ PAST HISTORY  Past Medical Illnesses:  hypertension, Crohn's disease, hiatal hernia, morbid obesity;  Cardiovascular Illnesses:  no previous history of cardiac disease.;   Surgical Procedures:  cholecystectomy (lap), shoulder repair-rt, vasectomy, cyst neck;  NYHA Classification:  I;  Canadian Angina Classification:  Class 0: Asymptomatic;  Cardiology Procedures-Invasive:  no history of prior cardiac procedures;  Cardiology Procedures-Noninvasive:  treadmill, event monitor March 2016;  LVEF not documented,   ____________________________ CARDIO-PULMONARY TEST DATES EKG Date:  09/04/2014;  Holter/Event Monitor Date: 09/04/2014;  Chest Xray Date: 02/28/2014;   ____________________________ FAMILY HISTORY Brother -- Brother alive and well Father -- Father alive with problem, Diabetes mellitus type 2, Hypertension Mother -- Mother alive with problem, Hypertension, Asthma Sister -- Sister alive and well ____________________________ SOCIAL HISTORY Alcohol Use:  no alcohol use;  Smoking:  smokes, 20 pack year history;  Diet:  regular diet;  Lifestyle:  married;  Exercise:  no regular exercise;  Occupation:  unemployed and Environmental health practitioner;  Residence:  lives with wife;   ____________________________ [place]:  obesity  Integumentary:no rashes or new skin lesions. Eyes: wears eye glasses/contact lenses Ears, Nose, Throat, Mouth:  denies any hearing loss, epistaxis, hoarseness or difficulty speaking. Respiratory: denies dyspnea, cough, wheezing or hemoptysis. Cardiovascular:  please review HPI Abdominal: see HPI Genitourinary-Male: no dysuria, urgency, frequency, or nocturia  Musculoskeletal:  denies arthritis, venous insufficiency, or muscle weakness Neurological:  history of migraine headaches Psychiatric:  denies depression or anxiety  ____________________________ PHYSICAL EXAMINATION VITAL SIGNS  Blood Pressure:  182/110 Sitting, Right arm, large cuff  , 176/114 Standing, Right arm and large cuff   Pulse:  76/min. Weight:  300.00 lbs. Height:  72"BMI: 40  Constitutional:  pleasant African Americian male in no acute distress, severely obese Skin:  warm  and dry to touch, no apparent skin  lesions, or masses noted. Head:  normocephalic, normal hair pattern, no masses or tenderness Eyes:  EOMS Intact, PERRLA, C and S clear, Funduscopic exam not done. ENT:  ears, nose and throat reveal no gross abnormalities.  Dentition good. Neck:  supple, without massess. No JVD, thyromegaly or carotid bruits. Carotid upstroke normal. Chest:  normal symmetry, clear to auscultation. Cardiac:  regular rhythm, normal S1 and S2, No S3 or S4, no murmurs, gallops or rubs detected. Abdomen:  abdomen soft,non-tender, no masses, no hepatospenomegaly, or aneurysm noted Peripheral Pulses:  the femoral,dorsalis pedis, and posterior tibial pulses are full and equal bilaterally with no bruits auscultated. Extremities & Back:  no deformities, clubbing, cyanosis, erythema or edema observed. Normal muscle strength and tone. Neurological:  no gross motor or sensory deficits noted, affect appropriate, oriented x3. ____________________________ IMPRESSIONS/PLAN  1. Episodic palpitations rule out SVT or other arrhythmia 2. Hypertension-not well controlled in the office today 3. Morbid obesity 4. Possible history of Crohn's disease  Recommendations:  Have patient wear an event monitor. His EKG shows voltage for LVH but is otherwise normal. ____________________________ TODAYS ORDERS  1. [place]: Today  2. Return Visit: 1 month  3. 12 Lead EKG: Today                       ____________________________ Cardiology Physician:  Kerry Hough MD [facility]

## 2014-11-25 ENCOUNTER — Other Ambulatory Visit: Payer: Self-pay | Admitting: Family Medicine

## 2014-12-01 ENCOUNTER — Other Ambulatory Visit: Payer: Self-pay | Admitting: Internal Medicine

## 2014-12-01 ENCOUNTER — Ambulatory Visit (INDEPENDENT_AMBULATORY_CARE_PROVIDER_SITE_OTHER): Payer: Self-pay | Admitting: Internal Medicine

## 2014-12-01 VITALS — HR 90 | Temp 98.9°F | Ht 73.0 in | Wt 295.0 lb

## 2014-12-01 DIAGNOSIS — M79604 Pain in right leg: Secondary | ICD-10-CM

## 2014-12-01 DIAGNOSIS — R5383 Other fatigue: Secondary | ICD-10-CM

## 2014-12-01 DIAGNOSIS — J0100 Acute maxillary sinusitis, unspecified: Secondary | ICD-10-CM

## 2014-12-01 DIAGNOSIS — M545 Low back pain: Secondary | ICD-10-CM

## 2014-12-01 DIAGNOSIS — R748 Abnormal levels of other serum enzymes: Secondary | ICD-10-CM

## 2014-12-01 DIAGNOSIS — R059 Cough, unspecified: Secondary | ICD-10-CM

## 2014-12-01 DIAGNOSIS — R109 Unspecified abdominal pain: Secondary | ICD-10-CM

## 2014-12-01 DIAGNOSIS — R05 Cough: Secondary | ICD-10-CM

## 2014-12-01 LAB — POCT UA - MICROSCOPIC ONLY
CASTS, UR, LPF, POC: NEGATIVE
CRYSTALS, UR, HPF, POC: NEGATIVE
Yeast, UA: NEGATIVE

## 2014-12-01 LAB — GLUCOSE, POCT (MANUAL RESULT ENTRY): POC GLUCOSE: 87 mg/dL (ref 70–99)

## 2014-12-01 LAB — POCT CBC
Granulocyte percent: 59.7 %G (ref 37–80)
HCT, POC: 49.2 % (ref 43.5–53.7)
Hemoglobin: 15.8 g/dL (ref 14.1–18.1)
LYMPH, POC: 2.4 (ref 0.6–3.4)
MCH, POC: 27.3 pg (ref 27–31.2)
MCHC: 32.2 g/dL (ref 31.8–35.4)
MCV: 84.9 fL (ref 80–97)
MID (cbc): 0.5 (ref 0–0.9)
MPV: 8.4 fL (ref 0–99.8)
PLATELET COUNT, POC: 212 10*3/uL (ref 142–424)
POC GRANULOCYTE: 4.4 (ref 2–6.9)
POC LYMPH %: 32.9 % (ref 10–50)
POC MID %: 7.4 % (ref 0–12)
RBC: 5.8 M/uL (ref 4.69–6.13)
RDW, POC: 14.2 %
WBC: 7.4 10*3/uL (ref 4.6–10.2)

## 2014-12-01 LAB — POCT URINALYSIS DIPSTICK
GLUCOSE UA: NEGATIVE
Ketones, UA: 15
Leukocytes, UA: NEGATIVE
Nitrite, UA: NEGATIVE
PH UA: 6
Protein, UA: 30
RBC UA: NEGATIVE
SPEC GRAV UA: 1.025
UROBILINOGEN UA: 1

## 2014-12-01 MED ORDER — CYCLOBENZAPRINE HCL 10 MG PO TABS: 10.0000 mg | ORAL_TABLET | Freq: Every day | ORAL | Status: DC

## 2014-12-01 MED ORDER — AMOXICILLIN 500 MG PO CAPS: 1000.0000 mg | ORAL_CAPSULE | Freq: Two times a day (BID) | ORAL | Status: AC

## 2014-12-01 MED ORDER — HYDROCODONE-ACETAMINOPHEN 5-325 MG PO TABS: 1.0000 | ORAL_TABLET | Freq: Four times a day (QID) | ORAL | Status: DC | PRN

## 2014-12-01 NOTE — Progress Notes (Signed)
Subjective:    Patient ID: Craig Guzman, male    DOB: 04-13-1971, 44 y.o.   MRN: [id number] This chart was scribed for Tami Lin, MD by Marti Sleigh, Medical Scribe. This patient was seen in Room 11 and the patient's care was started a 9:21 PM.  Chief Complaint  Patient presents with  . Cough  . Headache  . Tinnitus  . Nasal Congestion  . Flank Pain    Right-Poss. Kidney Stone  . Hand Injury    Left-Smashed fingers in car window today    HPI HPI Comments: Craig Guzman is a 44 y.o. male with a past hx of repeated kidney stones and crones disease who presents to [facility] reporting for multiple complaints. Pt has not felt well for the last two months and has had a productive cough and nasal congestion intermittently, but states it worsened in the last week. Pt also states he has had back pain with associated trouble bending over since Thursday or Friday. Pt endorses nocturia, increased from baseline. Pt states he had to get up and urinate every 15-20 minutes to urinate. Pt denies fever or blood in stool. Pt states if he eats and walks he usually vomits. Pt state he must sit after he eats in order to keep food down. 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 Pt endorses night sweats yesterday. Pt states he has a past diagnosis of crohnes disease. Pt states he has been off humera for two months. Dx unsure.  Patient Active Problem List   Diagnosis Date Noted  . Morbid obesity 09/19/2013  . Diarrhea 07/10/2013  . 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 Hypertension 11/26/2012  . Gastroparesis??? 12/08/2011  . Migraine headache 12/08/2011  . Long-term use of immunosuppressant medication- 6 MPand Humira 08/08/2011  . Crohn's disease of stomach and colon-suspected 05/17/2011  . GERD (gastroesophageal reflux disease) 04/21/2011   Cough prod in am No fever Lots of sin press L>R No ST,CP No night sw  Pt works at night, and sleeps through the day. Pt's wife  states he has not been feeling well, and he's been spending more time in bed  Hx elev Alk Ptase needing reck  New job 1 week  Review of Systems  Constitutional: Positive for diaphoresis. Negative for fever, chills and unexpected weight change.       Fatigue 1 week  HENT: Positive for congestion and rhinorrhea. Negative for trouble swallowing.   Eyes: Negative for visual disturbance.  Respiratory: Positive for cough. Negative for shortness of breath and wheezing.   Cardiovascular: Negative for chest pain, palpitations and leg swelling.  Gastrointestinal: Positive for vomiting. Negative for abdominal pain.       Posttussive  Genitourinary: Positive for frequency and flank pain. Negative for dysuria, hematuria and testicular pain.  Musculoskeletal: Positive for back pain. Negative for gait problem.  Neurological: Positive for headaches. Negative for dizziness and light-headedness.  Psychiatric/Behavioral: Negative for behavioral problems.       Objective:   Physical Exam  Constitutional: He is oriented to person, place, and time. He appears well-developed and well-nourished.  HENT:  Head: Normocephalic and atraumatic.  Right Ear: External ear normal.  Left Ear: External ear normal.  Mouth/Throat: Oropharynx is clear and moist.  Purulent nasal d/c and tender L max area  Eyes: Conjunctivae and EOM are normal. Pupils are equal, round, and reactive to light.  Neck: Normal range of motion. Neck supple. No thyromegaly present.  Cardiovascular: Normal rate,  regular rhythm and normal heart sounds.   No murmur heard. Pulmonary/Chest: Effort normal and breath sounds normal. No respiratory distress. He has no wheezes.  Abdominal: There is no tenderness.  Musculoskeletal: Normal range of motion. He exhibits no edema.  Straight leg raise positive on right and L at 75degr No distal sens or moor losses DTR symm.  Lymphadenopathy:    He has no cervical adenopathy.  Neurological: He is alert  and oriented to person, place, and time. Coordination normal.  Skin: Skin is warm and dry. He is not diaphoretic.  Psychiatric: He has a normal mood and affect. His behavior is normal.  Nursing note and vitals reviewed. Pulse 90  Temp(Src) 98.9 F (37.2 C) (Oral)  Ht 6' 1"  (1.854 m)  Wt 295 lb (133.811 kg)  BMI 38.93 kg/m2  SpO2 99%   Results for orders placed or performed in visit on 12/01/14  POCT UA - Microscopic Only  Result Value Ref Range   WBC, Ur, HPF, POC 2-4    RBC, urine, microscopic 1-3    Bacteria, U Microscopic trace    Mucus, UA 1+    Epithelial cells, urine per micros 2-3    Crystals, Ur, HPF, POC neg    Casts, Ur, LPF, POC neg    Yeast, UA neg   POCT urinalysis dipstick  Result Value Ref Range   Color, UA dark yellow    Clarity, UA clear    Glucose, UA neg    Bilirubin, UA small    Ketones, UA 15    Spec Grav, UA 1.025    Blood, UA neg    pH, UA 6.0    Protein, UA 30    Urobilinogen, UA 1.0    Nitrite, UA neg    Leukocytes, UA Negative         Assessment & Plan:  Right flank pain - Plan: POCT UA - Microscopic Only, POCT urinalysis dipstick, Comprehensive metabolic panel  Other fatigue -? 2 to #4  Cough -   Acute maxillary sinusitis- rx amox  Lumbar pain with radiation down both legs-exercises/meds 2weeks then reck if no change  Elevated alkaline phosphatase level-if incr,fractionate-   Meds ordered this encounter  Medications  . amoxicillin (AMOXIL) 500 MG capsule    Sig: Take 2 capsules (1,000 mg total) by mouth 2 (two) times daily.    Dispense:  40 capsule    Refill:  0  . cyclobenzaprine (FLEXERIL) 10 MG tablet    Sig: Take 1 tablet (10 mg total) by mouth at bedtime.    Dispense:  30 tablet    Refill:  0  . HYDROcodone-acetaminophen (NORCO) 5-325 MG per tablet    Sig: Take 1 tablet by mouth every 6 (six) hours as needed.    Dispense:  25 tablet    Refill:  0    I have completed the patient encounter in its entirety as  documented by the scribe, with editing by me where necessary. Jariyah Hackley P. Laney Pastor, M.D.

## 2014-12-02 LAB — COMPREHENSIVE METABOLIC PANEL
ALT: 21 U/L (ref 0–53)
AST: 23 U/L (ref 0–37)
Albumin: 3.7 g/dL (ref 3.5–5.2)
Alkaline Phosphatase: 127 U/L — ABNORMAL HIGH (ref 39–117)
BUN: 10 mg/dL (ref 6–23)
CO2: 27 meq/L (ref 19–32)
Calcium: 9.1 mg/dL (ref 8.4–10.5)
Chloride: 101 mEq/L (ref 96–112)
Creat: 1.11 mg/dL (ref 0.50–1.35)
GLUCOSE: 89 mg/dL (ref 70–99)
POTASSIUM: 3.8 meq/L (ref 3.5–5.3)
SODIUM: 138 meq/L (ref 135–145)
Total Bilirubin: 0.6 mg/dL (ref 0.2–1.2)
Total Protein: 7.2 g/dL (ref 6.0–8.3)

## 2014-12-08 LAB — ALKALINE PHOSPHATASE ISOENZYMES
Alkaline Phonsphatase: 111 U/L (ref 40–115)
Bone Isoenzymes: 32 % (ref 28–66)
Intestinal Isoenzymes: 0 % — ABNORMAL LOW (ref 1–24)
Liver Isoenzymes: 68 % (ref 25–69)
Macrohepatic isoenzymes: 0 %

## 2014-12-17 ENCOUNTER — Telehealth: Payer: Self-pay | Admitting: Internal Medicine

## 2014-12-17 NOTE — Telephone Encounter (Signed)
Left message for patient to call back  

## 2014-12-18 NOTE — Telephone Encounter (Signed)
Patient reports that he has changed jobs and since starting he has had two episodes "I actually think it was what I ate" but he had to be out of work.  His new insurance is active in August and I have scheduled him an office visit with you for 02/15/15.  His job has asked that he file for intermittent FMLA.  He is asked to deliver the papers to [facility] and we will take care of it.  He is doing well otherwise.  He will call me for any problems prior to the office visit in August.

## 2014-12-22 ENCOUNTER — Encounter (HOSPITAL_COMMUNITY): Payer: Self-pay | Admitting: Emergency Medicine

## 2014-12-22 ENCOUNTER — Emergency Department (HOSPITAL_COMMUNITY): Payer: Self-pay

## 2014-12-22 ENCOUNTER — Emergency Department (HOSPITAL_COMMUNITY)
Admission: EM | Admit: 2014-12-22 | Discharge: 2014-12-22 | Disposition: A | Discharge status: Home / Self Care | Payer: Self-pay | Attending: Emergency Medicine | Admitting: Emergency Medicine

## 2014-12-22 DIAGNOSIS — K219 Gastro-esophageal reflux disease without esophagitis: Secondary | ICD-10-CM | POA: Insufficient documentation

## 2014-12-22 DIAGNOSIS — Z8701 Personal history of pneumonia (recurrent): Secondary | ICD-10-CM | POA: Insufficient documentation

## 2014-12-22 DIAGNOSIS — E669 Obesity, unspecified: Secondary | ICD-10-CM | POA: Insufficient documentation

## 2014-12-22 DIAGNOSIS — Z79899 Other long term (current) drug therapy: Secondary | ICD-10-CM | POA: Insufficient documentation

## 2014-12-22 DIAGNOSIS — Z7951 Long term (current) use of inhaled steroids: Secondary | ICD-10-CM | POA: Insufficient documentation

## 2014-12-22 DIAGNOSIS — K529 Noninfective gastroenteritis and colitis, unspecified: Secondary | ICD-10-CM | POA: Insufficient documentation

## 2014-12-22 DIAGNOSIS — Z87442 Personal history of urinary calculi: Secondary | ICD-10-CM | POA: Insufficient documentation

## 2014-12-22 DIAGNOSIS — K5090 Crohn's disease, unspecified, without complications: Secondary | ICD-10-CM | POA: Insufficient documentation

## 2014-12-22 DIAGNOSIS — Z720 Tobacco use: Secondary | ICD-10-CM | POA: Insufficient documentation

## 2014-12-22 DIAGNOSIS — G43909 Migraine, unspecified, not intractable, without status migrainosus: Secondary | ICD-10-CM | POA: Insufficient documentation

## 2014-12-22 DIAGNOSIS — I10 Essential (primary) hypertension: Secondary | ICD-10-CM | POA: Insufficient documentation

## 2014-12-22 DIAGNOSIS — Z8659 Personal history of other mental and behavioral disorders: Secondary | ICD-10-CM | POA: Insufficient documentation

## 2014-12-22 LAB — CBC WITH DIFFERENTIAL/PLATELET
BASOS ABS: 0 10*3/uL (ref 0.0–0.1)
BASOS PCT: 0 % (ref 0–1)
EOS ABS: 0.2 10*3/uL (ref 0.0–0.7)
Eosinophils Relative: 3 % (ref 0–5)
HCT: 43.3 % (ref 39.0–52.0)
Hemoglobin: 15.1 g/dL (ref 13.0–17.0)
Lymphocytes Relative: 34 % (ref 12–46)
Lymphs Abs: 1.9 10*3/uL (ref 0.7–4.0)
MCH: 28.9 pg (ref 26.0–34.0)
MCHC: 34.9 g/dL (ref 30.0–36.0)
MCV: 82.8 fL (ref 78.0–100.0)
Monocytes Absolute: 0.7 10*3/uL (ref 0.1–1.0)
Monocytes Relative: 13 % — ABNORMAL HIGH (ref 3–12)
NEUTROS ABS: 2.8 10*3/uL (ref 1.7–7.7)
NEUTROS PCT: 50 % (ref 43–77)
PLATELETS: 190 10*3/uL (ref 150–400)
RBC: 5.23 MIL/uL (ref 4.22–5.81)
RDW: 14.3 % (ref 11.5–15.5)
WBC: 5.5 10*3/uL (ref 4.0–10.5)

## 2014-12-22 LAB — URINALYSIS, ROUTINE W REFLEX MICROSCOPIC
Bilirubin Urine: NEGATIVE
Glucose, UA: NEGATIVE mg/dL
Hgb urine dipstick: NEGATIVE
KETONES UR: 15 mg/dL — AB
LEUKOCYTES UA: NEGATIVE
NITRITE: NEGATIVE
PROTEIN: 30 mg/dL — AB
SPECIFIC GRAVITY, URINE: 1.021 (ref 1.005–1.030)
Urobilinogen, UA: 1 mg/dL (ref 0.0–1.0)
pH: 7 (ref 5.0–8.0)

## 2014-12-22 LAB — COMPREHENSIVE METABOLIC PANEL
ALBUMIN: 3.4 g/dL — AB (ref 3.5–5.0)
ALK PHOS: 101 U/L (ref 38–126)
ALT: 28 U/L (ref 17–63)
AST: 29 U/L (ref 15–41)
Anion gap: 6 (ref 5–15)
BUN: 8 mg/dL (ref 6–20)
CO2: 27 mmol/L (ref 22–32)
CREATININE: 1.24 mg/dL (ref 0.61–1.24)
Calcium: 8.9 mg/dL (ref 8.9–10.3)
Chloride: 106 mmol/L (ref 101–111)
GFR calc Af Amer: >60 mL/min (ref >60)
GFR calc non Af Amer: >60 mL/min (ref >60)
Glucose, Bld: 94 mg/dL (ref 65–99)
Potassium: 3.6 mmol/L (ref 3.5–5.1)
Sodium: 139 mmol/L (ref 135–145)
Total Bilirubin: 0.6 mg/dL (ref 0.3–1.2)
Total Protein: 7.2 g/dL (ref 6.5–8.1)

## 2014-12-22 LAB — URINE MICROSCOPIC-ADD ON

## 2014-12-22 MED ORDER — MORPHINE SULFATE 4 MG/ML IJ SOLN
4.0000 mg | Freq: Once | INTRAMUSCULAR | Status: AC
  Administered 2014-12-22: 4 mg via INTRAVENOUS
  Filled 2014-12-22: qty 1

## 2014-12-22 MED ORDER — PROMETHAZINE HCL 25 MG PO TABS: 25.0000 mg | ORAL_TABLET | Freq: Three times a day (TID) | ORAL | Status: DC | PRN

## 2014-12-22 MED ORDER — IOHEXOL 300 MG/ML  SOLN: 25.0000 mL | Freq: Once | INTRAMUSCULAR | Status: DC | PRN

## 2014-12-22 MED ORDER — SODIUM CHLORIDE 0.9 % IV BOLUS (SEPSIS)
1000.0000 mL | Freq: Once | INTRAVENOUS | Status: AC
  Administered 2014-12-22: 1000 mL via INTRAVENOUS

## 2014-12-22 MED ORDER — OXYCODONE-ACETAMINOPHEN 5-325 MG PO TABS: 1.0000 | ORAL_TABLET | Freq: Four times a day (QID) | ORAL | Status: DC | PRN

## 2014-12-22 MED ORDER — IOHEXOL 300 MG/ML  SOLN
100.0000 mL | Freq: Once | INTRAMUSCULAR | Status: AC | PRN
  Administered 2014-12-22: 100 mL via INTRAVENOUS

## 2014-12-22 NOTE — ED Notes (Signed)
RN attempt IV;2nd RN to attempt to place IV

## 2014-12-22 NOTE — ED Notes (Signed)
Patient transported to CT 

## 2014-12-22 NOTE — ED Notes (Signed)
Unable to obtain IV access x3 nurses. Dr. Aline Brochure made aware of need for ultrasound IV. Equipment at bedside.

## 2014-12-22 NOTE — ED Notes (Signed)
Attempted IV access with no success.

## 2014-12-22 NOTE — Progress Notes (Signed)
[facility] [phone]  Spoke to patient regarding primary care resources and the [facility] orange card. Follow up appointment made with the [facility] for Friday June 24,2016 @ 3:30pm, pt verbalized understanding of the appointment. My contact information provided for any future questions or concerns. No other [facility] needs identified at this time.

## 2014-12-22 NOTE — ED Provider Notes (Signed)
CSN: [id number]     Arrival date & time 12/22/14  7482 History   First MD Initiated Contact with Patient 12/22/14 [phone]     Chief Complaint  Patient presents with  . Abdominal Pain     (Consider location/radiation/quality/duration/timing/severity/associated sxs/prior Treatment) HPI Patient presents to the emergency department with left lower abdominal pain that started last week.  The patient states that he did have an episode of vomiting yesterday and some diarrhea.  The patient states that he did not take any medications prior to arrival.  Patient denies headache, blurred vision, weakness, dizziness, chest pain, shortness of breath, back pain, dysuria, bloody stool, hematemesis, fever or syncope.  The patient states that palpation seems to make this pain worse Past Medical History  Diagnosis Date  . GERD (gastroesophageal reflux disease)   . HTN (hypertension)   . Gastric ulcer     antral  . Biliary dyskinesia   . Anxiety   . Obesity   . History of migraine headaches   . Allergy     trees/ grasses/ animals/dust/mold  . Crohn's disease     Stomach, terminal ileum, cecum  . Kidney stone 09/20/2012  . Colloid thyroid nodule   . Pneumonia 02/27/2014    ED   Past Surgical History  Procedure Laterality Date  . Cholecystectomy  2011    [place]  . Vasectomy      bilateral w/lysis of penile adhesions  . Esophagogastroduodenoscopy  05/04/11, 07/26/11    granulomatous gastritis - Crohn's  . Multiple tooth extractions  2005  . Colonoscopy  07/26/11    Crohn's colitis, ileitis  . Shoulder surgery      right  . Joint replacement    . Flexible sigmoidoscopy     Family History  Problem Relation Age of Onset  . Colon polyps Father   . Diabetes Father   . Hypertension Father   . Colon cancer Neg Hx   . Rectal cancer Neg Hx   . Stomach cancer Neg Hx   . Esophageal cancer Neg Hx   . Hypertension Mother   . Asthma Mother   . Heart disease Maternal Grandmother    History   Substance Use Topics  . Smoking status: Current Every Day Smoker -- 1.00 packs/day for 21 years    Types: Cigarettes  . Smokeless tobacco: Former Systems developer    Types: Snuff     Comment: Counseling sheet given in exam room   . Alcohol Use: No    Review of Systems All other systems negative except as documented in the HPI. All pertinent positives and negatives as reviewed in the HPI.    Allergies  Zithromax  Home Medications   Prior to Admission medications   Medication Sig Start Date End Date Taking? Authorizing Provider  acetaminophen (TYLENOL) 325 MG tablet Take 650 mg by mouth every 6 (six) hours as needed for mild pain.   Yes Historical Provider, MD  cyclobenzaprine (FLEXERIL) 10 MG tablet Take 1 tablet (10 mg total) by mouth 3 (three) times daily as needed. 11/26/12  Yes Robyn Haber, MD  lansoprazole (PREVACID) 30 MG capsule Take 1 capsule (30 mg total) by mouth 2 (two) times daily before a meal. 08/24/14  Yes Posey Boyer, MD  lisinopril-hydrochlorothiazide (PRINZIDE,ZESTORETIC) 20-12.5 MG per tablet TAKE 2 TABLETS BY MOUTH DAILY. 08/24/14  Yes Posey Boyer, MD  verapamil (CALAN-SR) 180 MG CR tablet TAKE 2 TABLETS (360 MG TOTAL) BY MOUTH DAILY. 11/26/14  Yes Mancel Bale, PA-C  cyclobenzaprine (FLEXERIL) 10 MG tablet Take 1 tablet (10 mg total) by mouth at bedtime. Patient not taking: Reported on 12/22/2014 12/01/14   Leandrew Koyanagi, MD  diphenoxylate-atropine (LOMOTIL) 2.5-0.025 MG per tablet Take 1-2 po q 6 hours prn diarrhea Patient not taking: Reported on 12/22/2014 08/24/14   Posey Boyer, MD  fluticasone [facility]) 50 MCG/ACT nasal spray Place 2 sprays into both nostrils daily. Patient not taking: Reported on 12/22/2014 08/24/14   Posey Boyer, MD  HYDROcodone-acetaminophen [facility]) 5-325 MG per tablet Take 1 tablet by mouth every 6 (six) hours as needed. Patient not taking: Reported on 12/22/2014 12/01/14   Leandrew Koyanagi, MD  topiramate (TOPAMAX) 25 MG tablet Take 3  tablets (75 mg total) by mouth at bedtime. Patient not taking: Reported on 12/22/2014 08/24/14   Posey Boyer, MD   BP 154/94 mmHg  Pulse 59  Temp(Src) 98.3 F (36.8 C) (Oral)  Resp 16  Ht 6' 1"  (1.854 m)  Wt 295 lb (133.811 kg)  BMI 38.93 kg/m2  SpO2 97% Physical Exam  Constitutional: He is oriented to person, place, and time. He appears well-developed and well-nourished. No distress.  HENT:  Head: Normocephalic and atraumatic.  Mouth/Throat: Oropharynx is clear and moist.  Eyes: Pupils are equal, round, and reactive to light.  Neck: Normal range of motion. Neck supple.  Cardiovascular: Normal rate, regular rhythm and normal heart sounds.  Exam reveals no gallop and no friction rub.   No murmur heard. Pulmonary/Chest: Effort normal and breath sounds normal. No respiratory distress.  Abdominal: Soft. Bowel sounds are normal. He exhibits no distension. There is tenderness. There is no rebound and no guarding.  Neurological: He is alert and oriented to person, place, and time. He exhibits normal muscle tone. Coordination normal.  Skin: Skin is warm and dry. No rash noted. No erythema.  Nursing note and vitals reviewed.   ED Course  Procedures (including critical care time) Labs Review Labs Reviewed  CBC WITH DIFFERENTIAL/PLATELET - Abnormal; Notable for the following:    Monocytes Relative 13 (*)    All other components within normal limits  COMPREHENSIVE METABOLIC PANEL - Abnormal; Notable for the following:    Albumin 3.4 (*)    All other components within normal limits  URINALYSIS, ROUTINE W REFLEX MICROSCOPIC (NOT AT [facility]) - Abnormal; Notable for the following:    Color, Urine AMBER (*)    Ketones, ur 15 (*)    Protein, ur 30 (*)    All other components within normal limits  URINE MICROSCOPIC-ADD ON - Abnormal; Notable for the following:    Casts GRANULAR CAST (*)    All other components within normal limits    Imaging Review Ct Abdomen Pelvis W  Contrast  12/22/2014   CLINICAL DATA:  Left-sided abdominal pain. History of Crohn's disease  EXAM: CT ABDOMEN AND PELVIS WITH CONTRAST  TECHNIQUE: Multidetector CT imaging of the abdomen and pelvis was performed using the standard protocol following bolus administration of intravenous contrast. Oral contrast was also administered.  CONTRAST:  124m OMNIPAQUE IOHEXOL 300 MG/ML  SOLN  COMPARISON:  June 26, 2011  FINDINGS: There is slight bibasilar lung atelectatic change. Lung bases are otherwise clear. No renal or ureteral calculi. No hydronephrosis.  No focal liver lesions are identified. Gallbladder is absent. There is no appreciable biliary duct dilatation.  Spleen, pancreas, and adrenals appear normal.  There is a prominent column of Bertin in the left kidney, an anatomic variant which is stable. There  is no renal mass or hydronephrosis on either side. There is no renal or ureteral calculus on either side.  In the pelvis, the urinary bladder is midline with wall thickness within normal limits. There is no appreciable pelvic mass or pelvic fluid collection. Appendix region appears normal.  There is some mild thickening of the mesentery in the left lower quadrant anteriorly with a nearby loop of bowel at the origin of the sigmoid colon. There is no localized diverticulum in this area. Bowel wall does not appear appreciably thickened in this area.  There are several small peripancreatic lymph nodes which appear stable compared to the prior study. No new lymph node prominence is identified. There is no frank adenopathy in the abdomen or pelvis by size criteria. There is no bowel obstruction. No free air or portal venous air. There is no ascites or abscess in the abdomen pelvis. There is no abdominal aortic aneurysm. There are no blastic or lytic bone lesions. There is degenerative change in the lumbar spine with vacuum phenomenon at L4-5 and L5-S1.  IMPRESSION: There is mild mesenteric inflammation in the left  lower quadrant adjacent to the origin of the sigmoid colon. There is no appreciable diverticulum in this area. The wall of the colon in this area does not appear thickened, although it is subtly ill-defined. Suspect localized colitis in this area with adjacent mesenteric inflammation. No abscess or evidence of perforation.  No other inflammatory focus identified. Gallbladder absent. No bowel obstruction.  No renal or ureteral calculi.  No hydronephrosis.  Note that the region of the terminal ileum appears unremarkable by CT.   Electronically Signed   By: Lowella Grip III M.D.   On: 12/22/2014 09:33      The patient will be discharged home and he is given follow-up appointment for this Friday by the case manager.  Patient is advised return here as needed.  We will give him symptomatic control  Dalia Heading, PA-C 12/22/14 1138  Pamella Pert, MD 12/22/14 2248

## 2014-12-22 NOTE — Discharge Instructions (Signed)
Follow-up as directed Friday with your doctor.  Return here as needed

## 2014-12-22 NOTE — ED Notes (Signed)
Per pt he began experiencing sharp lower left-sided abdominal pain although there is dull pain that goes the entire lower abdomen.  Pt reports reports he vomited on Sunday after he ate.  Hx of Crones, however he states this is much different.  Was able to keep food down since 3am this morning.

## 2014-12-23 ENCOUNTER — Telehealth: Payer: Self-pay | Admitting: Internal Medicine

## 2014-12-23 NOTE — Telephone Encounter (Signed)
Patient went to the ER for abdominal pain and vomiting.  He was sent home with oxycodone for the pain.  Feeling better has only taken one dose of oxycodone last night at 11:00.  He reports that his pain is a 3 on a scale of 1-10.  He dropped off his intermittent FMLA paperwork on Monday.  CT in the ER showed mesenteric inflammation around the sigmoid colon.  [name] will you please review ER note and the CT and advise what we need to do from here.  He asked that when I call back I call his wife [name], he just got off work and is going to try and go to sleep.

## 2014-12-23 NOTE — Telephone Encounter (Signed)
I spoke with [name] she will have Craig Guzman here tomorrow at 3:30

## 2014-12-23 NOTE — Telephone Encounter (Signed)
He is too complicated to handle over the phone in my opinion so I am willing to see him tomorrow afternoon at 330 if we could do that.

## 2014-12-23 NOTE — Telephone Encounter (Signed)
The med list in his chart is accurate.  I reviewed them all with his wife Craig Guzman.  He has not had Humira since December.  She notes not adverse effects being off of it.

## 2014-12-23 NOTE — Telephone Encounter (Signed)
I think CT scan is ok - so are his labs Sounds like some of the same problems he has had over the years  I need an accurate list of meds he is taking now  Also how long since he has had Humira and do they think it was helping?

## 2014-12-24 ENCOUNTER — Encounter: Payer: Self-pay | Admitting: Internal Medicine

## 2014-12-24 ENCOUNTER — Ambulatory Visit (INDEPENDENT_AMBULATORY_CARE_PROVIDER_SITE_OTHER): Payer: Self-pay | Admitting: Internal Medicine

## 2014-12-24 VITALS — BP 158/102 | HR 80 | Ht 72.0 in | Wt 290.0 lb

## 2014-12-24 DIAGNOSIS — R1031 Right lower quadrant pain: Secondary | ICD-10-CM

## 2014-12-24 DIAGNOSIS — K50919 Crohn's disease, unspecified, with unspecified complications: Secondary | ICD-10-CM

## 2014-12-24 DIAGNOSIS — R1032 Left lower quadrant pain: Secondary | ICD-10-CM

## 2014-12-24 DIAGNOSIS — K3184 Gastroparesis: Secondary | ICD-10-CM

## 2014-12-24 NOTE — Patient Instructions (Signed)
  Please  Keep your August 15th  9:30AM appointment with Dr Carlean Purl.     Today we are giving you a low fiber diet to read and follow.  Please follow up with your PCP regarding your elevated blood pressure.  I appreciate the opportunity to care for you. Silvano Rusk, MD, [facility]

## 2014-12-24 NOTE — Progress Notes (Signed)
   Subjective:    Patient ID: Craig Guzman, male    DOB: 05-19-1971, 44 y.o.   MRN: [id number] Cc: abdominal pain HPI Jafari returns after a long interval..he has been changing jobs and is not yet on new insurance. He has a long hx of granulomatous gastritis and also had right colon ulcers and dx of Crohn's. Had been treated with Humira but not on that x several months now. Does not think he is worse re: chronic intermittent vomiting off that medication overall but had a severe lower abdominal pain that led to ED visit in past few days. He is better - labs and CT abd/pelvis were unrevealing. Says that was a new type of pain. Has promethazine and narcotic analgesic at home.  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 Wife says has missed work a fair amount and concerned about maintaining that job.  Wt Readings from Last 3 Encounters:  12/24/14 290 lb (131.543 kg)  12/22/14 295 lb (133.811 kg)  12/01/14 295 lb (133.811 kg)   Moving bowels ok. Does get headaches when he has nausea and vomiting  Medications, allergies, past medical history, past surgical history, family history and social history are reviewed and updated in the EMR.   Review of Systems Has lost weight in past year was #315 in 01/2014    Objective:   Physical Exam @BP  158/102 mmHg  Pulse 80  Ht 6' (1.829 m)  Wt 290 lb (131.543 kg)  BMI 39.32 kg/m2@  General:  NAD Eyes:   anicteric Lungs:  clear Heart:: S1S2 no rubs, murmurs or gallops Abdomen:  soft and nontender, BS+ - bese Ext:   no edema, cyanosis or clubbing    Data Reviewed:  Labs and CT reports in EMR    Assessment & Plan:  Bilateral lower abdominal pain  Crohn's disease, unspecified complication  Gastroparesis???  Crohn's disease of stomach and colon-suspected Need to see if he can get Humira samples or restart when new Rx plan begins ? If this abd pain episode related to Crohn's   Gastroparesis??? Still has the intermittent  vomiting Still ? Possible migraine association Cause not entirely clear Low fiber diet

## 2014-12-25 ENCOUNTER — Ambulatory Visit: Payer: Self-pay

## 2014-12-25 ENCOUNTER — Telehealth: Payer: Self-pay

## 2014-12-25 NOTE — Telephone Encounter (Signed)
I spoke with Craig Guzman patient's wife and they believe that it will be [employer]. I have enough Cimza samples for the induction and the first maintenance injections assuming you order 400 mg on 0,2,4 weeks then 400 mg every month.  This would last till the last week in August then he would be due for the next one last week of September.  This would give [place] time to get it pre-certed with his insurance and if he has been on the drug and doing well should get approved.  Please advise if ok to start him on the Cimzia samples and ok to keep the above schedule?

## 2014-12-25 NOTE — Assessment & Plan Note (Addendum)
Still has the intermittent vomiting Still ? Possible migraine association Cause not entirely clear Low fiber diet

## 2014-12-25 NOTE — Telephone Encounter (Signed)
-----   Message from Gatha Mayer, MD sent at 12/25/2014  4:29 AM EDT ----- Regarding: ? Humira samples vs Cimzia Please ask wife or patient what insurance the new insurance company will be  Considering restarting Humira vs trying Cimzia

## 2014-12-25 NOTE — Assessment & Plan Note (Addendum)
Need to see if he can get Humira samples or restart when new Rx plan begins ? If this abd pain episode related to Crohn's

## 2014-12-25 NOTE — Telephone Encounter (Signed)
Let's do that and also ? If he is up to date on TB testing

## 2014-12-25 NOTE — Telephone Encounter (Signed)
I spoke with Craig Guzman.  She will have Hakan come Monday 12/28/14 9:45 ish for PPD.  They are aware that he will need to come back Wed 12/30/14 to have it read and Cimzia instructions.  He has used Humira in the past, just needs instructions for self/significant other Cimzia injection.

## 2014-12-25 NOTE — Telephone Encounter (Signed)
Patient needs PPD I left a message for his wife to call back.

## 2014-12-28 ENCOUNTER — Ambulatory Visit (INDEPENDENT_AMBULATORY_CARE_PROVIDER_SITE_OTHER): Payer: Self-pay | Admitting: Internal Medicine

## 2014-12-28 DIAGNOSIS — K50919 Crohn's disease, unspecified, with unspecified complications: Secondary | ICD-10-CM

## 2014-12-28 DIAGNOSIS — Z7960 Long term (current) use of unspecified immunomodulators and immunosuppressants: Secondary | ICD-10-CM

## 2014-12-28 DIAGNOSIS — Z79899 Other long term (current) drug therapy: Secondary | ICD-10-CM

## 2014-12-30 ENCOUNTER — Encounter: Payer: Self-pay | Admitting: Internal Medicine

## 2014-12-30 ENCOUNTER — Telehealth: Payer: Self-pay | Admitting: Internal Medicine

## 2014-12-30 LAB — TB SKIN TEST
Induration: 0 mm
TB SKIN TEST: NEGATIVE

## 2014-12-30 MED ORDER — CERTOLIZUMAB PEGOL 2 X 200 MG/ML ~~LOC~~ KIT: PACK | SUBCUTANEOUS | Status: DC

## 2014-12-30 NOTE — Telephone Encounter (Signed)
Yes and yes Note on the way

## 2014-12-30 NOTE — Telephone Encounter (Signed)
Patient and his wife came in for PPD and Cimzia teaching.  His wife properly demonstrated injection using aseptic technique.  They were given enough samples today of Cimzia for weeks 2, 4 and then one month.  They will call once they have the insurance information so we can start the pre-cert process for Cimzia.  They understand to call me for any additional questions or concerns

## 2014-12-30 NOTE — Telephone Encounter (Signed)
Patient has been out of work again since Monday due to abdominal pain and nausea/  Can we write him a note.  He is due to come today for PPD reading. Can I start him on Cimzia today if PPD negative, I have it here???????

## 2014-12-30 NOTE — Telephone Encounter (Signed)
Patient will come today between 2 and 3 for teaching and to have PPD read

## 2014-12-30 NOTE — Telephone Encounter (Signed)
I left a message for [name] to call back to set up a time to come today for the injections and have PPD read

## 2015-02-10 ENCOUNTER — Emergency Department (HOSPITAL_COMMUNITY)
Admission: EM | Admit: 2015-02-10 | Discharge: 2015-02-11 | Disposition: A | Discharge status: Home / Self Care | Payer: BLUE CROSS/BLUE SHIELD | Attending: Emergency Medicine | Admitting: Emergency Medicine

## 2015-02-10 ENCOUNTER — Encounter (HOSPITAL_COMMUNITY): Payer: Self-pay | Admitting: Emergency Medicine

## 2015-02-10 DIAGNOSIS — I10 Essential (primary) hypertension: Secondary | ICD-10-CM | POA: Diagnosis not present

## 2015-02-10 DIAGNOSIS — G43909 Migraine, unspecified, not intractable, without status migrainosus: Secondary | ICD-10-CM | POA: Insufficient documentation

## 2015-02-10 DIAGNOSIS — K219 Gastro-esophageal reflux disease without esophagitis: Secondary | ICD-10-CM | POA: Diagnosis not present

## 2015-02-10 DIAGNOSIS — Z720 Tobacco use: Secondary | ICD-10-CM | POA: Insufficient documentation

## 2015-02-10 DIAGNOSIS — Z79899 Other long term (current) drug therapy: Secondary | ICD-10-CM | POA: Insufficient documentation

## 2015-02-10 DIAGNOSIS — E669 Obesity, unspecified: Secondary | ICD-10-CM | POA: Diagnosis not present

## 2015-02-10 DIAGNOSIS — Z7951 Long term (current) use of inhaled steroids: Secondary | ICD-10-CM | POA: Insufficient documentation

## 2015-02-10 DIAGNOSIS — Z8701 Personal history of pneumonia (recurrent): Secondary | ICD-10-CM | POA: Diagnosis not present

## 2015-02-10 DIAGNOSIS — N390 Urinary tract infection, site not specified: Secondary | ICD-10-CM | POA: Insufficient documentation

## 2015-02-10 DIAGNOSIS — K5900 Constipation, unspecified: Secondary | ICD-10-CM | POA: Diagnosis not present

## 2015-02-10 DIAGNOSIS — R300 Dysuria: Secondary | ICD-10-CM | POA: Diagnosis present

## 2015-02-10 DIAGNOSIS — Z87442 Personal history of urinary calculi: Secondary | ICD-10-CM | POA: Insufficient documentation

## 2015-02-10 DIAGNOSIS — Z8659 Personal history of other mental and behavioral disorders: Secondary | ICD-10-CM | POA: Diagnosis not present

## 2015-02-10 NOTE — ED Provider Notes (Signed)
CSN: [id number]     Arrival date & time 02/10/15  2244 History  This chart was scribed for Rolland Porter, MD by Hansel Feinstein, ED Scribe. This patient was seen in room WA02/WA02 and the patient's care was started at 1:01 AM.     Chief Complaint  Patient presents with  . Dysuria   The history is provided by the patient. No language interpreter was used.    HPI Comments: Craig Guzman is a 44 y.o. male with Hx of GERD, HTN, biliary dyskinesia, Crohn's disease, renal calculi who presents to the Emergency Department complaining of moderate dysuria onset 2 days ago. He states associated constipation (last BM was yesterday; first time in 2 days), lower back pain (worse on the right), chills, dark urine, penile and testicular pain before and immediately after urination, but not during urination. Pt notes that back pain is worsened with ambulation and in semi-fowlers position. He states similar Sx from past episodes of renal calculi. He states he is normally able to pass his kidney stones without urological intervention. Pt states no sick contact.  Pt smokes 1 ppd. He is a non-drinker. He works in Theatre manager at [employer]. He denies new nausea (baseline due to Crohn's), new vomiting (last episode one week ago), diarrhea, fever, scrotal swelling, penile or testicular/scotum swelling.     PCP Dr. Carlota Raspberry.  Past Medical History  Diagnosis Date  . GERD (gastroesophageal reflux disease)   . HTN (hypertension)   . Gastric ulcer     antral  . Biliary dyskinesia   . Anxiety   . Obesity   . History of migraine headaches   . Allergy     trees/ grasses/ animals/dust/mold  . Crohn's disease     Stomach, terminal ileum, cecum  . Kidney stone 09/20/2012  . Colloid thyroid nodule   . Pneumonia 02/27/2014    ED   Past Surgical History  Procedure Laterality Date  . Cholecystectomy  2011    [place]  . Vasectomy      bilateral w/lysis of penile adhesions  . Esophagogastroduodenoscopy  05/04/11,  07/26/11    granulomatous gastritis - Crohn's  . Multiple tooth extractions  2005  . Colonoscopy  07/26/11    Crohn's colitis, ileitis  . Shoulder surgery      right  . Joint replacement    . Flexible sigmoidoscopy     Family History  Problem Relation Age of Onset  . Colon polyps Father   . Diabetes Father   . Hypertension Father   . Colon cancer Neg Hx   . Rectal cancer Neg Hx   . Stomach cancer Neg Hx   . Esophageal cancer Neg Hx   . Hypertension Mother   . Asthma Mother   . Heart disease Maternal Grandmother    Social History  Substance Use Topics  . Smoking status: Current Every Day Smoker -- 1.00 packs/day for 21 years    Types: Cigarettes  . Smokeless tobacco: Former Systems developer    Types: Snuff     Comment: Counseling sheet given in exam room   . Alcohol Use: No  employed Lives with spouse  Review of Systems  Constitutional: Positive for chills. Negative for fever.  Gastrointestinal: Positive for constipation. Negative for nausea, vomiting and diarrhea.  Genitourinary: Positive for dysuria, penile pain and testicular pain. Negative for penile swelling and scrotal swelling.  Musculoskeletal: Positive for back pain.  All other systems reviewed and are negative.  Allergies  Zithromax  Home Medications  Prior to Admission medications   Medication Sig Start Date End Date Taking? Authorizing Provider  acetaminophen (TYLENOL) 325 MG tablet Take 650 mg by mouth every 6 (six) hours as needed for mild pain.   Yes Historical Provider, MD  Certolizumab Pegol (CIMZIA PREFILLED) 2 X 200 MG/ML KIT Inject 400 mg into skin on day 0, 2 and 4 weeks then 400 mg into skin monthly 12/30/14  Yes Gatha Mayer, MD  cyclobenzaprine (FLEXERIL) 10 MG tablet Take 1 tablet (10 mg total) by mouth 3 (three) times daily as needed. 11/26/12  Yes Robyn Haber, MD  diphenoxylate-atropine (LOMOTIL) 2.5-0.025 MG per tablet Take 1-2 po q 6 hours prn diarrhea 08/24/14  Yes Posey Boyer, MD  fluticasone  [facility]) 50 MCG/ACT nasal spray Place 2 sprays into both nostrils daily. 08/24/14  Yes Posey Boyer, MD  HYDROcodone-acetaminophen (NORCO) 5-325 MG per tablet Take 1 tablet by mouth every 6 (six) hours as needed. 12/01/14  Yes Leandrew Koyanagi, MD  lansoprazole (PREVACID) 30 MG capsule Take 1 capsule (30 mg total) by mouth 2 (two) times daily before a meal. 08/24/14  Yes Posey Boyer, MD  lisinopril-hydrochlorothiazide (PRINZIDE,ZESTORETIC) 20-12.5 MG per tablet TAKE 2 TABLETS BY MOUTH DAILY. Patient taking differently: Take 2 tablets by mouth daily.  08/24/14  Yes Posey Boyer, MD  Multiple Vitamin (MULTIVITAMIN WITH MINERALS) TABS tablet Take 1 tablet by mouth daily.   Yes Historical Provider, MD  oxyCODONE-acetaminophen (PERCOCET/ROXICET) 5-325 MG per tablet Take 1 tablet by mouth every 6 (six) hours as needed for severe pain. 12/22/14  Yes Christopher Lawyer, PA-C  promethazine (PHENERGAN) 25 MG tablet Take 1 tablet (25 mg total) by mouth every 8 (eight) hours as needed for nausea or vomiting. 12/22/14  Yes Dalia Heading, PA-C  topiramate (TOPAMAX) 25 MG tablet Take 3 tablets (75 mg total) by mouth at bedtime. 08/24/14  Yes Posey Boyer, MD  verapamil (CALAN-SR) 180 MG CR tablet TAKE 2 TABLETS (360 MG TOTAL) BY MOUTH DAILY. 11/26/14  Yes Mancel Bale, PA-C  cephALEXin (KEFLEX) 500 MG capsule Take 1 capsule (500 mg total) by mouth 3 (three) times daily. 02/11/15   Rolland Porter, MD  phenazopyridine (PYRIDIUM) 200 MG tablet Take 1 tablet (200 mg total) by mouth 3 (three) times daily. 02/11/15   Rolland Porter, MD   BP 159/97 mmHg  Pulse 82  Temp(Src) 98.8 F (37.1 C) (Oral)  Resp 22  SpO2 100%  Vital signs normal    Physical Exam  Constitutional: He is oriented to person, place, and time. He appears well-developed and well-nourished.  Non-toxic appearance. He does not appear ill. No distress.  HENT:  Head: Normocephalic and atraumatic.  Right Ear: External ear normal.  Left Ear: External  ear normal.  Nose: Nose normal. No mucosal edema or rhinorrhea.  Mouth/Throat: Oropharynx is clear and moist and mucous membranes are normal. No dental abscesses or uvula swelling.  Eyes: Conjunctivae and EOM are normal. Pupils are equal, round, and reactive to light.  Neck: Normal range of motion and full passive range of motion without pain. Neck supple.  Cardiovascular: Normal rate, regular rhythm and normal heart sounds.  Exam reveals no gallop and no friction rub.   No murmur heard. Pulmonary/Chest: Effort normal and breath sounds normal. No respiratory distress. He has no wheezes. He has no rhonchi. He has no rales. He exhibits no tenderness and no crepitus.  Abdominal: Soft. Normal appearance and bowel sounds are normal. He exhibits no distension.  There is tenderness. There is no rebound and no guarding.    Lower right flank pain to palpation. Diffuse right-sided abdominal TTP.   Genitourinary:  Normal external genitalia. Not circumcised. Tenderness of the right testicle, but it is not enlarged.  Musculoskeletal: Normal range of motion. He exhibits no edema or tenderness.       Back:  Moves all extremities well.   Neurological: He is alert and oriented to person, place, and time. He has normal strength. No cranial nerve deficit.  Skin: Skin is warm, dry and intact. No rash noted. No erythema. No pallor.  Psychiatric: He has a normal mood and affect. His speech is normal and behavior is normal. His mood appears not anxious.  Nursing note and vitals reviewed.   ED Course  Procedures (including critical care time)  Medications  0.9 %  sodium chloride infusion (0 mLs Intravenous Stopped 02/11/15 0315)    Followed by  0.9 %  sodium chloride infusion (not administered)  fentaNYL (SUBLIMAZE) injection 50 mcg (50 mcg Intravenous Given 02/11/15 0217)  ondansetron (ZOFRAN) injection 4 mg (4 mg Intravenous Given 02/11/15 0217)  cefTRIAXone (ROCEPHIN) 2 g in dextrose 5 % 50 mL IVPB (0 g  Intravenous Stopped 02/11/15 0230)  sodium chloride 0.9 % bolus 1,000 mL (0 mLs Intravenous Stopped 02/11/15 0445)  phenazopyridine (PYRIDIUM) tablet 200 mg (200 mg Oral Given 02/11/15 0348)  acetaminophen (TYLENOL) tablet 1,000 mg (1,000 mg Oral Given 02/11/15 0525)    DIAGNOSTIC STUDIES: Oxygen Saturation is 100% on RA, normal by my interpretation.    COORDINATION OF CARE: 1:17 AM Discussed treatment plan with pt at bedside and pt agreed to plan.   Patient was given IV fluids and IV antibiotics. He also was given IV pain and nausea medicine.  3:28 AM Discussed labs. Advised pt that he had dark urine and elevated leukocytes, likely indicating a UTI. Discussed CAT scan findings that he had some edema of the bladder wall consistent with a UTI, but no renal calculi. The inflammation that was seen in his left colon on his last CT scan is gone. Pt was advised to increase fluid intake. Notified pt of symptoms that would cause him to return to the ED.   Labs Review Results for orders placed or performed during the hospital encounter of 02/10/15  Urinalysis, Routine w reflex microscopic-may I&O cath if menses (not at [facility])  Result Value Ref Range   Color, Urine ORANGE (A) YELLOW   APPearance CLOUDY (A) CLEAR   Specific Gravity, Urine 1.033 (H) 1.005 - 1.030   pH 6.0 5.0 - 8.0   Glucose, UA 100 (A) NEGATIVE mg/dL   Hgb urine dipstick NEGATIVE NEGATIVE   Bilirubin Urine SMALL (A) NEGATIVE   Ketones, ur NEGATIVE NEGATIVE mg/dL   Protein, ur 30 (A) NEGATIVE mg/dL   Urobilinogen, UA 1.0 0.0 - 1.0 mg/dL   Nitrite NEGATIVE NEGATIVE   Leukocytes, UA MODERATE (A) NEGATIVE  Urine microscopic-add on  Result Value Ref Range   Squamous Epithelial / LPF RARE RARE   WBC, UA 21-50 <3 WBC/hpf   RBC / HPF 0-2 <3 RBC/hpf   Bacteria, UA FEW (A) RARE   Urine-Other MUCOUS PRESENT   Comprehensive metabolic panel  Result Value Ref Range   Sodium 138 135 - 145 mmol/L   Potassium 3.5 3.5 - 5.1 mmol/L    Chloride 107 101 - 111 mmol/L   CO2 23 22 - 32 mmol/L   Glucose, Bld 98 65 - 99 mg/dL  BUN 12 6 - 20 mg/dL   Creatinine, Ser 1.05 0.61 - 1.24 mg/dL   Calcium 8.8 (L) 8.9 - 10.3 mg/dL   Total Protein 7.4 6.5 - 8.1 g/dL   Albumin 3.7 3.5 - 5.0 g/dL   AST 25 15 - 41 U/L   ALT 25 17 - 63 U/L   Alkaline Phosphatase 93 38 - 126 U/L   Total Bilirubin 0.9 0.3 - 1.2 mg/dL   GFR calc non Af Amer >60 >60 mL/min   GFR calc Af Amer >60 >60 mL/min   Anion gap 8 5 - 15  CBC with Differential  Result Value Ref Range   WBC 9.8 4.0 - 10.5 K/uL   RBC 4.82 4.22 - 5.81 MIL/uL   Hemoglobin 13.8 13.0 - 17.0 g/dL   HCT 40.5 39.0 - 52.0 %   MCV 84.0 78.0 - 100.0 fL   MCH 28.6 26.0 - 34.0 pg   MCHC 34.1 30.0 - 36.0 g/dL   RDW 14.5 11.5 - 15.5 %   Platelets 159 150 - 400 K/uL   Neutrophils Relative % 63 43 - 77 %   Neutro Abs 6.2 1.7 - 7.7 K/uL   Lymphocytes Relative 21 12 - 46 %   Lymphs Abs 2.1 0.7 - 4.0 K/uL   Monocytes Relative 14 (H) 3 - 12 %   Monocytes Absolute 1.3 (H) 0.1 - 1.0 K/uL   Eosinophils Relative 2 0 - 5 %   Eosinophils Absolute 0.2 0.0 - 0.7 K/uL   Basophils Relative 0 0 - 1 %   Basophils Absolute 0.0 0.0 - 0.1 K/uL    Laboratory interpretation all normal except UTI   Imaging Review Ct Renal Stone Study  02/11/2015   CLINICAL DATA:  Dysuria and groin region pain, more severe on the right than on the left. History of Crohn's disease  EXAM: CT ABDOMEN AND PELVIS WITHOUT CONTRAST  TECHNIQUE: Multidetector CT imaging of the abdomen and pelvis was performed following the standard protocol without oral or intravenous contrast material administration.  COMPARISON:  December 22, 2014  FINDINGS: Lung bases are clear.  No focal liver lesions are identified on this noncontrast enhanced study. Gallbladder is surgically absent. There is no biliary duct dilatation.  Spleen, pancreas, and adrenals appear normal.  Kidneys bilaterally show no mass or hydronephrosis on either side. There is no renal or  ureteral calculus on either side.  In the pelvis, the urinary bladder is partially decompressed. The wall thickness of the bladder is upper normal. Prostate is slightly prominent for age. There is no pelvic mass or pelvic fluid collection. Since the prior study, there has been interval resolution of the mesenteric inflammation in the left lower quadrant. No inflammation is noted in the mesenteric a on this study.  There is a small ventral hernia containing only fat. The appendix region appears normal. Terminal ileum appears normal on this study.  There is no bowel obstruction. No free air or portal venous air. There is no ascites, adenopathy, or abscess in the abdomen or pelvis. There is no abdominal aortic aneurysm. There is degenerative change in the lumbar spine with vacuum phenomenon at L4-5 and L5-S1. There are no blastic or lytic bone lesions.  IMPRESSION: No renal or ureteral calculus. No hydronephrosis. Urinary bladder wall thickness is upper normal. Given patient's clinical symptoms, it may be prudent to correlate with urinalysis to assess for possible degree of cystitis. Prostate is noted to be rather prominent for age.  No bowel obstruction. No  abscess. No mesenteric inflammation or bowel wall thickening. Terminal ileum region appears unremarkable. No fistula is seen. No periappendiceal region inflammation. Gallbladder absent.   Electronically Signed   By: Lowella Grip III M.D.   On: 02/11/2015 02:37     EKG Interpretation None      MDM   Final diagnoses:  Urinary tract infection without hematuria, site unspecified    Discharge Medication List as of 02/11/2015  4:58 AM    START taking these medications   Details  cephALEXin (KEFLEX) 500 MG capsule Take 1 capsule (500 mg total) by mouth 3 (three) times daily., Starting 02/11/2015, Until Discontinued, Print    phenazopyridine (PYRIDIUM) 200 MG tablet Take 1 tablet (200 mg total) by mouth 3 (three) times daily., Starting 02/11/2015,  Until Discontinued, Print        Plan discharge  Rolland Porter, MD, FACEP   I personally performed the services described in this documentation, which was scribed in my presence. The recorded information has been reviewed and considered.  Rolland Porter, MD, FACEP   I personally performed the services described in this documentation, which was scribed in my presence. The recorded information has been reviewed and considered.  Rolland Porter, MD, Barbette Or, MD 02/11/15 [phone]

## 2015-02-10 NOTE — ED Notes (Signed)
Pt states it is painful to urinate  Pt states it hurts when he starts to pee and at the end of the stream  Pt states when he is done he has a throbbing pain in his testicles  Pt is c/o lower back pain  Pt states sxs started a couple days ago  Pt states his urine also has a bad odor to  Pt states his urine had blood in it Monday but it has gotten lighter in color

## 2015-02-11 ENCOUNTER — Emergency Department (HOSPITAL_COMMUNITY): Payer: BLUE CROSS/BLUE SHIELD

## 2015-02-11 LAB — URINALYSIS, ROUTINE W REFLEX MICROSCOPIC
GLUCOSE, UA: 100 mg/dL — AB
HGB URINE DIPSTICK: NEGATIVE
Ketones, ur: NEGATIVE mg/dL
Nitrite: NEGATIVE
PROTEIN: 30 mg/dL — AB
Specific Gravity, Urine: 1.033 — ABNORMAL HIGH (ref 1.005–1.030)
Urobilinogen, UA: 1 mg/dL (ref 0.0–1.0)
pH: 6 (ref 5.0–8.0)

## 2015-02-11 LAB — CBC WITH DIFFERENTIAL/PLATELET
BASOS PCT: 0 % (ref 0–1)
Basophils Absolute: 0 10*3/uL (ref 0.0–0.1)
EOS ABS: 0.2 10*3/uL (ref 0.0–0.7)
Eosinophils Relative: 2 % (ref 0–5)
HCT: 40.5 % (ref 39.0–52.0)
Hemoglobin: 13.8 g/dL (ref 13.0–17.0)
Lymphocytes Relative: 21 % (ref 12–46)
Lymphs Abs: 2.1 10*3/uL (ref 0.7–4.0)
MCH: 28.6 pg (ref 26.0–34.0)
MCHC: 34.1 g/dL (ref 30.0–36.0)
MCV: 84 fL (ref 78.0–100.0)
Monocytes Absolute: 1.3 10*3/uL — ABNORMAL HIGH (ref 0.1–1.0)
Monocytes Relative: 14 % — ABNORMAL HIGH (ref 3–12)
Neutro Abs: 6.2 10*3/uL (ref 1.7–7.7)
Neutrophils Relative %: 63 % (ref 43–77)
PLATELETS: 159 10*3/uL (ref 150–400)
RBC: 4.82 MIL/uL (ref 4.22–5.81)
RDW: 14.5 % (ref 11.5–15.5)
WBC: 9.8 10*3/uL (ref 4.0–10.5)

## 2015-02-11 LAB — COMPREHENSIVE METABOLIC PANEL
ALBUMIN: 3.7 g/dL (ref 3.5–5.0)
ALK PHOS: 93 U/L (ref 38–126)
ALT: 25 U/L (ref 17–63)
AST: 25 U/L (ref 15–41)
Anion gap: 8 (ref 5–15)
BUN: 12 mg/dL (ref 6–20)
CO2: 23 mmol/L (ref 22–32)
Calcium: 8.8 mg/dL — ABNORMAL LOW (ref 8.9–10.3)
Chloride: 107 mmol/L (ref 101–111)
Creatinine, Ser: 1.05 mg/dL (ref 0.61–1.24)
GFR calc Af Amer: >60 mL/min (ref >60)
GFR calc non Af Amer: >60 mL/min (ref >60)
GLUCOSE: 98 mg/dL (ref 65–99)
POTASSIUM: 3.5 mmol/L (ref 3.5–5.1)
SODIUM: 138 mmol/L (ref 135–145)
TOTAL PROTEIN: 7.4 g/dL (ref 6.5–8.1)
Total Bilirubin: 0.9 mg/dL (ref 0.3–1.2)

## 2015-02-11 LAB — URINE MICROSCOPIC-ADD ON

## 2015-02-11 MED ORDER — PHENAZOPYRIDINE HCL 200 MG PO TABS
200.0000 mg | ORAL_TABLET | Freq: Once | ORAL | Status: AC
  Administered 2015-02-11: 200 mg via ORAL
  Filled 2015-02-11: qty 1

## 2015-02-11 MED ORDER — FENTANYL CITRATE (PF) 100 MCG/2ML IJ SOLN
50.0000 ug | Freq: Once | INTRAMUSCULAR | Status: AC
  Administered 2015-02-11: 50 ug via INTRAVENOUS
  Filled 2015-02-11: qty 2

## 2015-02-11 MED ORDER — SODIUM CHLORIDE 0.9 % IV BOLUS (SEPSIS)
1000.0000 mL | Freq: Once | INTRAVENOUS | Status: AC
  Administered 2015-02-11: 1000 mL via INTRAVENOUS

## 2015-02-11 MED ORDER — SODIUM CHLORIDE 0.9 % IV SOLN
1000.0000 mL | Freq: Once | INTRAVENOUS | Status: AC
  Administered 2015-02-11: 1000 mL via INTRAVENOUS

## 2015-02-11 MED ORDER — PHENAZOPYRIDINE HCL 200 MG PO TABS: 200.0000 mg | ORAL_TABLET | Freq: Three times a day (TID) | ORAL | Status: DC

## 2015-02-11 MED ORDER — SODIUM CHLORIDE 0.9 % IV SOLN: 1000.0000 mL | INTRAVENOUS | Status: DC

## 2015-02-11 MED ORDER — ACETAMINOPHEN 500 MG PO TABS
1000.0000 mg | ORAL_TABLET | Freq: Once | ORAL | Status: AC
  Administered 2015-02-11: 1000 mg via ORAL
  Filled 2015-02-11: qty 2

## 2015-02-11 MED ORDER — CEPHALEXIN 500 MG PO CAPS: 500.0000 mg | ORAL_CAPSULE | Freq: Three times a day (TID) | ORAL | Status: DC

## 2015-02-11 MED ORDER — ONDANSETRON HCL 4 MG/2ML IJ SOLN
4.0000 mg | Freq: Once | INTRAMUSCULAR | Status: AC
  Administered 2015-02-11: 4 mg via INTRAVENOUS
  Filled 2015-02-11: qty 2

## 2015-02-11 MED ORDER — DEXTROSE 5 % IV SOLN
2.0000 g | Freq: Once | INTRAVENOUS | Status: AC
  Administered 2015-02-11: 2 g via INTRAVENOUS
  Filled 2015-02-11 (×2): qty 2

## 2015-02-11 NOTE — ED Notes (Signed)
Error in documentation blood culture drawn in right hand and not peripheral

## 2015-02-11 NOTE — Discharge Instructions (Signed)
Drink plenty of fluids. Take the antibiotics until gone. Take the pyridium for pain on urination. Take ibuprofen 600 mg + acetaminophen 1000 mg 4 times a day for pain or fever. Return to the ED if you develop fever, pain in your flank or have uncontrolled vomiting or pain.  Urinary Tract Infection A urinary tract infection (UTI) can occur any place along the urinary tract. The tract includes the kidneys, ureters, bladder, and urethra. A type of germ called bacteria often causes a UTI. UTIs are often helped with antibiotic medicine.  HOME CARE   If given, take antibiotics as told by your doctor. Finish them even if you start to feel better.  Drink enough fluids to keep your pee (urine) clear or pale yellow.  Avoid tea, drinks with caffeine, and bubbly (carbonated) drinks.  Pee often. Avoid holding your pee in for a long time.  Pee before and after having sex (intercourse).  Wipe from front to back after you poop (bowel movement) if you are a woman. Use each tissue only once. GET HELP RIGHT AWAY IF:   You have back pain.  You have lower belly (abdominal) pain.  You have chills.  You feel sick to your stomach (nauseous).  You throw up (vomit).  Your burning or discomfort with peeing does not go away.  You have a fever.  Your symptoms are not better in 3 days. MAKE SURE YOU:   Understand these instructions.  Will watch your condition.  Will get help right away if you are not doing well or get worse. Document Released: 12/06/2007 Document Revised: 03/13/2012 Document Reviewed: 01/18/2012 [facility] Patient Information 2015 [place], [place]. This information is not intended to replace advice given to you by your health care provider. Make sure you discuss any questions you have with your health care provider.

## 2015-02-13 LAB — URINE CULTURE

## 2015-02-15 ENCOUNTER — Ambulatory Visit: Payer: Self-pay | Admitting: Internal Medicine

## 2015-02-15 ENCOUNTER — Telehealth: Payer: Self-pay | Admitting: Internal Medicine

## 2015-02-15 ENCOUNTER — Telehealth (HOSPITAL_COMMUNITY): Payer: Self-pay

## 2015-02-15 NOTE — Telephone Encounter (Signed)
Left message and asked him to call back re: symptom update

## 2015-02-15 NOTE — Telephone Encounter (Signed)
Post ED Visit - Positive Culture Follow-up  Culture report reviewed by antimicrobial stewardship pharmacist: []  Wes [place], Pharm.D., BCPS []  Heide Guile, Pharm.D., BCPS []  Alycia Rossetti, Pharm.D., BCPS [x]  [place], Pharm.D., BCPS, AAHIVP []  Legrand [place]o, Pharm.D., BCPS, AAHIVP []  Isac Sarna, Pharm.D., BCPS  Positive Urine culture, >/= 100,000 colonies -> E Coli Treated with Cephalexin, organism sensitive to the same and no further patient follow-up is required at this time.  Dortha Kern 02/15/2015, 4:47 AM

## 2015-02-16 LAB — CULTURE, BLOOD (ROUTINE X 2)
CULTURE: NO GROWTH
Culture: NO GROWTH

## 2015-02-23 NOTE — Telephone Encounter (Signed)
Spoke to wife Overall better but still w/ episodic vomiting Last week had nausea and vomiting problems with UTI and the Abx  Needs Cinmzia samples for Sept as is still waiting for insurance benefits

## 2015-02-23 NOTE — Telephone Encounter (Signed)
I spoke with Craig Guzman, he will get me another set of samples for Craig Guzman.  He is not due until the end of September.  I will contact the patient once the samples arrive.

## 2015-02-24 NOTE — Telephone Encounter (Signed)
I left a message for the patient to come pick up a sample of cimzia

## 2015-02-25 NOTE — Telephone Encounter (Signed)
Craig Guzman will come pick up a sample this afternoon

## 2015-03-18 ENCOUNTER — Other Ambulatory Visit: Payer: Self-pay | Admitting: Internal Medicine

## 2015-03-18 ENCOUNTER — Encounter (HOSPITAL_COMMUNITY): Payer: Self-pay | Admitting: *Deleted

## 2015-03-18 ENCOUNTER — Emergency Department (HOSPITAL_COMMUNITY)
Admission: EM | Admit: 2015-03-18 | Discharge: 2015-03-18 | Disposition: A | Discharge status: Home / Self Care | Payer: BLUE CROSS/BLUE SHIELD | Attending: Emergency Medicine | Admitting: Emergency Medicine

## 2015-03-18 DIAGNOSIS — Z87442 Personal history of urinary calculi: Secondary | ICD-10-CM | POA: Insufficient documentation

## 2015-03-18 DIAGNOSIS — K219 Gastro-esophageal reflux disease without esophagitis: Secondary | ICD-10-CM | POA: Diagnosis not present

## 2015-03-18 DIAGNOSIS — Z79899 Other long term (current) drug therapy: Secondary | ICD-10-CM | POA: Diagnosis not present

## 2015-03-18 DIAGNOSIS — Z7951 Long term (current) use of inhaled steroids: Secondary | ICD-10-CM | POA: Diagnosis not present

## 2015-03-18 DIAGNOSIS — Z720 Tobacco use: Secondary | ICD-10-CM | POA: Diagnosis not present

## 2015-03-18 DIAGNOSIS — N12 Tubulo-interstitial nephritis, not specified as acute or chronic: Secondary | ICD-10-CM | POA: Insufficient documentation

## 2015-03-18 DIAGNOSIS — E669 Obesity, unspecified: Secondary | ICD-10-CM | POA: Diagnosis not present

## 2015-03-18 DIAGNOSIS — Z792 Long term (current) use of antibiotics: Secondary | ICD-10-CM | POA: Diagnosis not present

## 2015-03-18 DIAGNOSIS — Z8701 Personal history of pneumonia (recurrent): Secondary | ICD-10-CM | POA: Insufficient documentation

## 2015-03-18 DIAGNOSIS — Z87440 Personal history of urinary (tract) infections: Secondary | ICD-10-CM | POA: Insufficient documentation

## 2015-03-18 DIAGNOSIS — K5090 Crohn's disease, unspecified, without complications: Secondary | ICD-10-CM | POA: Insufficient documentation

## 2015-03-18 DIAGNOSIS — Z8659 Personal history of other mental and behavioral disorders: Secondary | ICD-10-CM | POA: Insufficient documentation

## 2015-03-18 DIAGNOSIS — I10 Essential (primary) hypertension: Secondary | ICD-10-CM | POA: Insufficient documentation

## 2015-03-18 DIAGNOSIS — R300 Dysuria: Secondary | ICD-10-CM | POA: Diagnosis present

## 2015-03-18 LAB — URINALYSIS, ROUTINE W REFLEX MICROSCOPIC
Glucose, UA: NEGATIVE mg/dL
Ketones, ur: 15 mg/dL — AB
NITRITE: POSITIVE — AB
PROTEIN: 30 mg/dL — AB
Specific Gravity, Urine: 1.029 (ref 1.005–1.030)
UROBILINOGEN UA: 1 mg/dL (ref 0.0–1.0)
pH: 6 (ref 5.0–8.0)

## 2015-03-18 LAB — URINE MICROSCOPIC-ADD ON

## 2015-03-18 MED ORDER — LEVOFLOXACIN 750 MG PO TABS
750.0000 mg | ORAL_TABLET | Freq: Once | ORAL | Status: AC
  Administered 2015-03-18: 750 mg via ORAL
  Filled 2015-03-18: qty 1

## 2015-03-18 MED ORDER — HYDROCHLOROTHIAZIDE 12.5 MG PO CAPS
12.5000 mg | ORAL_CAPSULE | Freq: Once | ORAL | Status: AC
  Administered 2015-03-18: 12.5 mg via ORAL
  Filled 2015-03-18: qty 1

## 2015-03-18 MED ORDER — LISINOPRIL 20 MG PO TABS
20.0000 mg | ORAL_TABLET | Freq: Once | ORAL | Status: AC
Start: 2015-03-18 — End: 2015-03-18
  Administered 2015-03-18: 20 mg via ORAL
  Filled 2015-03-18: qty 1

## 2015-03-18 MED ORDER — LEVOFLOXACIN 750 MG PO TABS: 750.0000 mg | ORAL_TABLET | Freq: Every day | ORAL | Status: DC

## 2015-03-18 MED ORDER — IBUPROFEN 800 MG PO TABS
800.0000 mg | ORAL_TABLET | Freq: Once | ORAL | Status: DC
  Filled 2015-03-18: qty 1

## 2015-03-18 MED ORDER — VERAPAMIL HCL ER 180 MG PO TBCR
180.0000 mg | EXTENDED_RELEASE_TABLET | Freq: Once | ORAL | Status: AC
  Administered 2015-03-18: 180 mg via ORAL
  Filled 2015-03-18: qty 1

## 2015-03-18 MED ORDER — VERAPAMIL HCL 120 MG PO TABS
180.0000 mg | ORAL_TABLET | Freq: Once | ORAL | Status: DC
  Filled 2015-03-18: qty 1.5

## 2015-03-18 MED ORDER — METOCLOPRAMIDE HCL 10 MG PO TABS
10.0000 mg | ORAL_TABLET | Freq: Once | ORAL | Status: AC
  Administered 2015-03-18: 10 mg via ORAL
  Filled 2015-03-18: qty 1

## 2015-03-18 MED ORDER — ACETAMINOPHEN 325 MG PO TABS
650.0000 mg | ORAL_TABLET | Freq: Once | ORAL | Status: AC
  Administered 2015-03-18: 650 mg via ORAL
  Filled 2015-03-18: qty 2

## 2015-03-18 NOTE — ED Provider Notes (Signed)
CSN: [id number]     Arrival date & time 03/18/15  0009 History  This chart was scribed for Everlene Balls, MD by Meriel Pica, ED Scribe. This patient was seen in room D30C/D30C and the patient's care was started 2:39 AM.   Chief Complaint  Patient presents with  . Dysuria   The history is provided by the patient. No language interpreter was used.   HPI Comments: Craig Guzman is a 44 y.o. male, with a PMhx of UTI, renal calculi, Crohn's disease, and HTN, who presents to the Emergency Department complaining of sudden onset, constant, moderate dysuria that began 1 day ago. Pt reports pain before and after urination which he notes is similar to his past experience with a UTI. He associates a malodorous smell to urine and bilateral lower back pain that has been present for 1 week. Per wife, the pt has also been experiencing redness to distal penis.The pt was diagnosed with a UTI 3 weeks ago and given IV antibiotics, and prescribed Keflex and Pyridium which he was compliant with. PFhx of enlarged prostate. Denies hematuria or PMhx of enlarged prostate.   Past Medical History  Diagnosis Date  . GERD (gastroesophageal reflux disease)   . HTN (hypertension)   . Gastric ulcer     antral  . Biliary dyskinesia   . Anxiety   . Obesity   . History of migraine headaches   . Allergy     trees/ grasses/ animals/dust/mold  . Crohn's disease     Stomach, terminal ileum, cecum  . Kidney stone 09/20/2012  . Colloid thyroid nodule   . Pneumonia 02/27/2014    ED   Past Surgical History  Procedure Laterality Date  . Cholecystectomy  2011    [place]  . Vasectomy      bilateral w/lysis of penile adhesions  . Esophagogastroduodenoscopy  05/04/11, 07/26/11    granulomatous gastritis - Crohn's  . Multiple tooth extractions  2005  . Colonoscopy  07/26/11    Crohn's colitis, ileitis  . Shoulder surgery      right  . Joint replacement    . Flexible sigmoidoscopy     Family History  Problem Relation  Age of Onset  . Colon polyps Father   . Diabetes Father   . Hypertension Father   . Colon cancer Neg Hx   . Rectal cancer Neg Hx   . Stomach cancer Neg Hx   . Esophageal cancer Neg Hx   . Hypertension Mother   . Asthma Mother   . Heart disease Maternal Grandmother    Social History  Substance Use Topics  . Smoking status: Current Every Day Smoker -- 1.00 packs/day for 21 years    Types: Cigarettes  . Smokeless tobacco: Former Systems developer    Types: Snuff     Comment: Counseling sheet given in exam room   . Alcohol Use: No    Review of Systems 10 Systems reviewed and are negative for acute change except as noted in the HPI.  Allergies  Zithromax  Home Medications   Prior to Admission medications   Medication Sig Start Date End Date Taking? Authorizing Provider  acetaminophen (TYLENOL) 325 MG tablet Take 650 mg by mouth every 6 (six) hours as needed for mild pain.    Historical Provider, MD  cephALEXin (KEFLEX) 500 MG capsule Take 1 capsule (500 mg total) by mouth 3 (three) times daily. 02/11/15   Rolland Porter, MD  Certolizumab Pegol (CIMZIA PREFILLED) 2 X 200 MG/ML KIT Inject  400 mg into skin on day 0, 2 and 4 weeks then 400 mg into skin monthly 12/30/14   Gatha Mayer, MD  cyclobenzaprine (FLEXERIL) 10 MG tablet Take 1 tablet (10 mg total) by mouth 3 (three) times daily as needed. 11/26/12   Robyn Haber, MD  diphenoxylate-atropine (LOMOTIL) 2.5-0.025 MG per tablet Take 1-2 po q 6 hours prn diarrhea 08/24/14   Posey Boyer, MD  fluticasone [facility]) 50 MCG/ACT nasal spray Place 2 sprays into both nostrils daily. 08/24/14   Posey Boyer, MD  HYDROcodone-acetaminophen (NORCO) 5-325 MG per tablet Take 1 tablet by mouth every 6 (six) hours as needed. 12/01/14   Leandrew Koyanagi, MD  lansoprazole (PREVACID) 30 MG capsule Take 1 capsule (30 mg total) by mouth 2 (two) times daily before a meal. 08/24/14   Posey Boyer, MD  lisinopril-hydrochlorothiazide (PRINZIDE,ZESTORETIC) 20-12.5 MG  per tablet TAKE 2 TABLETS BY MOUTH DAILY. Patient taking differently: Take 2 tablets by mouth daily.  08/24/14   Posey Boyer, MD  Multiple Vitamin (MULTIVITAMIN WITH MINERALS) TABS tablet Take 1 tablet by mouth daily.    Historical Provider, MD  oxyCODONE-acetaminophen (PERCOCET/ROXICET) 5-325 MG per tablet Take 1 tablet by mouth every 6 (six) hours as needed for severe pain. 12/22/14   Dalia Heading, PA-C  phenazopyridine (PYRIDIUM) 200 MG tablet Take 1 tablet (200 mg total) by mouth 3 (three) times daily. 02/11/15   Rolland Porter, MD  promethazine (PHENERGAN) 25 MG tablet Take 1 tablet (25 mg total) by mouth every 8 (eight) hours as needed for nausea or vomiting. 12/22/14   Dalia Heading, PA-C  topiramate (TOPAMAX) 25 MG tablet Take 3 tablets (75 mg total) by mouth at bedtime. 08/24/14   Posey Boyer, MD  verapamil (CALAN-SR) 180 MG CR tablet TAKE 2 TABLETS (360 MG TOTAL) BY MOUTH DAILY. 11/26/14   Mancel Bale, PA-C   BP 174/107 mmHg  Pulse 74  Temp(Src) 98.3 F (36.8 C) (Oral)  Resp 20  Ht _0  (1.854 m)  Wt 289 lb 7 oz (131.288 kg)  BMI 38.19 kg/m2  SpO2 98% Physical Exam  Constitutional: He is oriented to person, place, and time. Vital signs are normal. He appears well-developed and well-nourished.  Non-toxic appearance. He does not appear ill. No distress.  HENT:  Head: Normocephalic and atraumatic.  Nose: Nose normal.  Mouth/Throat: Oropharynx is clear and moist. No oropharyngeal exudate.  Eyes: Conjunctivae and EOM are normal. Pupils are equal, round, and reactive to light. No scleral icterus.  Neck: Normal range of motion. Neck supple. No tracheal deviation, no edema, no erythema and normal range of motion present. No thyroid mass and no thyromegaly present.  Cardiovascular: Normal rate, regular rhythm, S1 normal, S2 normal, normal heart sounds, intact distal pulses and normal pulses.  Exam reveals no gallop and no friction rub.   No murmur heard. Pulses:      Radial  pulses are 2+ on the right side, and 2+ on the left side.       Dorsalis pedis pulses are 2+ on the right side, and 2+ on the left side.  Pulmonary/Chest: Effort normal and breath sounds normal. No respiratory distress. He has no wheezes. He has no rhonchi. He has no rales.  Abdominal: Soft. Normal appearance and bowel sounds are normal. He exhibits no distension, no ascites and no mass. There is no hepatosplenomegaly. There is no tenderness. There is no rebound, no guarding and no CVA tenderness.  Genitourinary:  Uncircumcised  male, no tenderness, no redness, no lesions   Musculoskeletal: Normal range of motion. He exhibits no edema or tenderness.  Lymphadenopathy:    He has no cervical adenopathy.  Neurological: He is alert and oriented to person, place, and time. He has normal strength. No cranial nerve deficit or sensory deficit.  Skin: Skin is warm, dry and intact. No petechiae and no rash noted. He is not diaphoretic. No erythema. No pallor.  Psychiatric: He has a normal mood and affect. His behavior is normal. Judgment normal.  Nursing note and vitals reviewed.   ED Course  Procedures  DIAGNOSTIC STUDIES: Oxygen Saturation is 98% on RA, normal by my interpretation.    COORDINATION OF CARE: 2:45 AM Discussed treatment plan with pt. Pt acknowledges and agrees to plan.   Labs Review Labs Reviewed  URINALYSIS, ROUTINE W REFLEX MICROSCOPIC (NOT AT [facility]) - Abnormal; Notable for the following:    APPearance CLOUDY (*)    Hgb urine dipstick LARGE (*)    Bilirubin Urine SMALL (*)    Ketones, ur 15 (*)    Protein, ur 30 (*)    Nitrite POSITIVE (*)    Leukocytes, UA LARGE (*)    All other components within normal limits  URINE MICROSCOPIC-ADD ON - Abnormal; Notable for the following:    Squamous Epithelial / LPF FEW (*)    Bacteria, UA FEW (*)    All other components within normal limits    Imaging Review No results found. I have personally reviewed and evaluated these images  and lab results as part of my medical decision-making.   EKG Interpretation None      MDM   Final diagnoses:  None    Patient presents to the emergency primary for dysuria and redness around his penis. Physical exam does not reveal any infection around the uncircumcised penis. His urinalysis does reveal an infection. He'll be treated with Levaquin. He does have a history in his family of enlarged prostate although he states that his prostate is normal.  He also has intermittent kidney stones for currently is not having any pain. I do not believe CT scan is warranted. Patient recently began a new immunosuppressive for his Crohn's disease, this may be because of his frequent UTIs. He has an appointment with his Crohn's disease physician and will discuss his urine infections with them. We'll discharge with 4 days of Levaquin to complete his treatment. He appears well and in no acute distress, his vital signs remain within his normal limits, he is asymptomatic from his high blood pressure, he is safe for discharge.  I personally performed the services described in this documentation, which was scribed in my presence. The recorded information has been reviewed and is accurate.   Everlene Balls, MD 03/18/15 [phone]

## 2015-03-18 NOTE — ED Notes (Signed)
Patient left at this time with all belongings. 

## 2015-03-18 NOTE — Discharge Instructions (Signed)
Urinary Tract Infection Mr. Gerding, your urine shows an infection, take antibiotics for 5 days for treatment. Follow-up with your doctors regarding your new medication and frequent urinary infections. If symptoms worsen come back to emergency department immediately. Thank you. A urinary tract infection (UTI) can occur any place along the urinary tract. The tract includes the kidneys, ureters, bladder, and urethra. A type of germ called bacteria often causes a UTI. UTIs are often helped with antibiotic medicine.  HOME CARE   If given, take antibiotics as told by your doctor. Finish them even if you start to feel better.  Drink enough fluids to keep your pee (urine) clear or pale yellow.  Avoid tea, drinks with caffeine, and bubbly (carbonated) drinks.  Pee often. Avoid holding your pee in for a long time.  Pee before and after having sex (intercourse).  Wipe from front to back after you poop (bowel movement) if you are a woman. Use each tissue only once. GET HELP RIGHT AWAY IF:   You have back pain.  You have lower belly (abdominal) pain.  You have chills.  You feel sick to your stomach (nauseous).  You throw up (vomit).  Your burning or discomfort with peeing does not go away.  You have a fever.  Your symptoms are not better in 3 days. MAKE SURE YOU:   Understand these instructions.  Will watch your condition.  Will get help right away if you are not doing well or get worse. Document Released: 12/06/2007 Document Revised: 03/13/2012 Document Reviewed: 01/18/2012 [facility] Patient Information 2015 [place], [place]. This information is not intended to replace advice given to you by your health care provider. Make sure you discuss any questions you have with your health care provider.

## 2015-03-18 NOTE — ED Notes (Signed)
Patient was seen at [facility] for UTI and is having the same symptoms

## 2015-04-07 ENCOUNTER — Ambulatory Visit (INDEPENDENT_AMBULATORY_CARE_PROVIDER_SITE_OTHER): Payer: BLUE CROSS/BLUE SHIELD | Admitting: Internal Medicine

## 2015-04-07 ENCOUNTER — Encounter: Payer: Self-pay | Admitting: Internal Medicine

## 2015-04-07 VITALS — BP 160/90 | HR 75 | Ht 73.0 in | Wt 294.2 lb

## 2015-04-07 DIAGNOSIS — Z23 Encounter for immunization: Secondary | ICD-10-CM | POA: Diagnosis not present

## 2015-04-07 DIAGNOSIS — Z79899 Other long term (current) drug therapy: Secondary | ICD-10-CM

## 2015-04-07 DIAGNOSIS — Z7960 Long term (current) use of unspecified immunomodulators and immunosuppressants: Secondary | ICD-10-CM

## 2015-04-07 DIAGNOSIS — K219 Gastro-esophageal reflux disease without esophagitis: Secondary | ICD-10-CM | POA: Diagnosis not present

## 2015-04-07 DIAGNOSIS — K50918 Crohn's disease, unspecified, with other complication: Secondary | ICD-10-CM | POA: Diagnosis not present

## 2015-04-07 DIAGNOSIS — K3184 Gastroparesis: Secondary | ICD-10-CM | POA: Diagnosis not present

## 2015-04-07 MED ORDER — OMEPRAZOLE-SODIUM BICARBONATE 40-1100 MG PO CAPS: 1.0000 | ORAL_CAPSULE | Freq: Every day | ORAL | Status: DC

## 2015-04-07 MED ORDER — PROCHLORPERAZINE 25 MG RE SUPP: 25.0000 mg | Freq: Two times a day (BID) | RECTAL | Status: DC | PRN

## 2015-04-07 NOTE — Patient Instructions (Addendum)
Today you have been given a flu shot.   We have sent the medications to your pharmacy for you to pick up at your convenience.  Follow up with [place] in 4 months please call in January for an appointment.  I appreciate the opportunity to care for you. Silvano Rusk, MD, [facility]

## 2015-04-07 NOTE — Assessment & Plan Note (Signed)
Continue Cimzia

## 2015-04-07 NOTE — Assessment & Plan Note (Addendum)
Compazine suppository prn Zegerid qhs

## 2015-04-07 NOTE — Assessment & Plan Note (Signed)
Flu vaccine

## 2015-04-07 NOTE — Assessment & Plan Note (Signed)
Zegerid qhs

## 2015-04-07 NOTE — Progress Notes (Signed)
   Subjective:    Patient ID: Craig Guzman, male    DOB: 18-Oct-1970, 44 y.o.   MRN: [id number] Chief complaint: Follow-up Crohn's disease recurrent vomiting HPI  This 44 year old African-American man is here for follow-up, I have him diagnosed with Crohn's of the upper GI tract and the colon and ileum. He's had problems with recurrent vomiting and delayed gastric emptying. He is currently on Cimzia Still vomiting - nausea when awakens and vomitis bile, etc  BM's ok no constipation or diarrhea  Wt Readings from Last 3 Encounters:  04/07/15 294 lb 3.2 oz (133.448 kg)  03/18/15 289 lb 7 oz (131.288 kg)  12/24/14 290 lb (131.543 kg)  Medications, allergies, past medical history, past surgical history, family history and social history are reviewed and updated in the EMR.  Review of Systems As above no fevers.    Objective:   Physical Exam @BP  160/90 mmHg  Pulse 75  Ht 6' 1"  (1.854 m)  Wt 294 lb 3.2 oz (133.448 kg)  BMI 38.82 kg/m2@  General:  NAD Eyes:   anicteric Lungs:  clear Heart:: S1S2 no rubs, murmurs or gallops Abdomen:  soft and nontender, BS+ Ext:   no edema, cyanosis or clubbing     Assessment & Plan:  Gastroparesis??? Compazine suppository prn Zegerid qhs   GERD (gastroesophageal reflux disease) Zegerid qhs  Crohn's disease of stomach and colon-suspected Continue Cimzia  Long-term use of immunosuppressant medication- Cimzia Flu vaccine   See me 4 months   Unfortunately. Call back 2 days after the visit and he has lost his job. We will get him samples of the Cimzia for right now. He is asking about pursuing disability. It's been difficult to help him with his vomiting problems even though he has had histologic improvement on the inflammation. I have [facility] to [facility] and we haven't really turned up much else either. I'm hopeful that taking Zegerid before he goes to bed may help with this problem he has with nausea and regurgitation vomiting upon  awakening.  At this point without insurance it would be problematic but I think an evaluation at another center could prove useful. He doesn't seem to have what sounds like rumination syndrome to me but I have not been able to completely determine why he has the problems he does, though my working diagnosis is inflammatory bowel disease.

## 2015-04-09 ENCOUNTER — Telehealth: Payer: Self-pay | Admitting: Internal Medicine

## 2015-04-09 NOTE — Telephone Encounter (Signed)
Craig Guzman lost his job today and has no insurance Not able to afford rx sent yesterday was $202 I have a call into [facility] about options for Boomer Winders and his wife are asking if you thought he should try to file for disability? In your opinion is his case severe enough to get it approved?

## 2015-04-09 NOTE — Telephone Encounter (Signed)
Craig Guzman will sample him.  We will get together paperwork to apply for indigent program.

## 2015-04-13 NOTE — Telephone Encounter (Signed)
Patient notified He will come pick up a sample for this month of Cimzia and sign [employer]implicity form.  He is aware that we are applying to the free assistance program for Cimzia.   Form in your office please sign and give back to me.

## 2015-04-13 NOTE — Telephone Encounter (Signed)
Maybe is answer for the disability  i want to look over everything on him again and I will contact him in coming days

## 2015-04-14 ENCOUNTER — Other Ambulatory Visit: Payer: Self-pay

## 2015-04-14 MED ORDER — CERTOLIZUMAB PEGOL 2 X 200 MG/ML ~~LOC~~ KIT: 400.0000 mg | PACK | SUBCUTANEOUS | Status: DC

## 2015-04-14 NOTE — Progress Notes (Signed)
Application for Cimzia faxed.  Patient and his wife will come sign application tomorrow

## 2015-05-13 NOTE — Telephone Encounter (Signed)
I spoke with assistance program and they are waiting of income verification from the patient. I spoke with the patient's wife and she will contact them and get all that information to them

## 2015-06-07 ENCOUNTER — Ambulatory Visit (INDEPENDENT_AMBULATORY_CARE_PROVIDER_SITE_OTHER): Payer: Self-pay | Admitting: Emergency Medicine

## 2015-06-07 VITALS — BP 142/102 | HR 99 | Temp 98.5°F | Resp 16 | Ht 73.0 in | Wt 302.0 lb

## 2015-06-07 DIAGNOSIS — I10 Essential (primary) hypertension: Secondary | ICD-10-CM

## 2015-06-07 DIAGNOSIS — J209 Acute bronchitis, unspecified: Secondary | ICD-10-CM

## 2015-06-07 DIAGNOSIS — J0140 Acute pansinusitis, unspecified: Secondary | ICD-10-CM

## 2015-06-07 MED ORDER — AMOXICILLIN-POT CLAVULANATE 875-125 MG PO TABS: 1.0000 | ORAL_TABLET | Freq: Two times a day (BID) | ORAL | Status: DC

## 2015-06-07 MED ORDER — HYDROCOD POLST-CPM POLST ER 10-8 MG/5ML PO SUER: 5.0000 mL | Freq: Two times a day (BID) | ORAL | Status: DC

## 2015-06-07 NOTE — Patient Instructions (Signed)

## 2015-06-07 NOTE — Progress Notes (Signed)
Subjective:  Patient ID: Craig Guzman, male    DOB: 1970-11-23  Age: 44 y.o. MRN: [id number]  CC: Nasal Congestion; nose bleed; Hoarse; and Cough   HPI Craig Guzman presents  he has some pain history of 4 days of intermittent wheeze. He is concerned that he has sinus infection. Has nasal congestion and a purulent occasional brown nasal drainage. Postnasal drainage which is purulent color. He has a cough productive purulent sputum. He has no wheezing or shortness of breath. He has no sore throat or ear pain. Has no fever or chills. No nausea or vomiting. No stool change. No rash. No improvement with over-the-counter medication she history of prior sinus infection  History Craig Guzman has a past medical history of GERD (gastroesophageal reflux disease); HTN (hypertension); Gastric ulcer; Biliary dyskinesia; Anxiety; Obesity; History of migraine headaches; Allergy; Crohn's disease ([place]); Kidney stone (09/20/2012); Colloid thyroid nodule; and Pneumonia (02/27/2014).   He has past surgical history that includes Cholecystectomy (2011); Vasectomy; Esophagogastroduodenoscopy (05/04/11, 07/26/11); Multiple tooth extractions (2005); Colonoscopy (07/26/11); Shoulder surgery; Joint replacement; and Flexible sigmoidoscopy.   His  family history includes Asthma in his mother; Colon polyps in his father; Diabetes in his father; Heart disease in his maternal grandmother; Hypertension in his father and mother. There is no history of Colon cancer, Rectal cancer, Stomach cancer, or Esophageal cancer.  He   reports that he has been smoking Cigarettes.  He has a 21 pack-year smoking history. He has quit using smokeless tobacco. His smokeless tobacco use included Snuff. He reports that he does not drink alcohol or use illicit drugs.  Outpatient Prescriptions Prior to Visit  Medication Sig Dispense Refill  . Certolizumab Pegol (CIMZIA PREFILLED) 2 X 200 MG/ML KIT Inject 400 mg into the skin every 30 (thirty) days.  Inject 400 mg or 2 pens into skin once a month 6 each 3  . cyclobenzaprine (FLEXERIL) 10 MG tablet Take 1 tablet (10 mg total) by mouth 3 (three) times daily as needed. 30 tablet 11  . lisinopril-hydrochlorothiazide (PRINZIDE,ZESTORETIC) 20-12.5 MG per tablet TAKE 2 TABLETS BY MOUTH DAILY. (Patient taking differently: Take 2 tablets by mouth daily. ) 180 tablet 3  . omeprazole-sodium bicarbonate (ZEGERID) 40-1100 MG capsule Take 1 capsule by mouth at bedtime. 30 capsule 5  . prochlorperazine (COMPRO) 25 MG suppository Place 1 suppository (25 mg total) rectally every 12 (twelve) hours as needed for nausea or vomiting. 12 suppository 1  . verapamil (CALAN-SR) 180 MG CR tablet TAKE 2 TABLETS (360 MG TOTAL) BY MOUTH DAILY. 180 tablet 0   No facility-administered medications prior to visit.    Social History   Social History  . Marital Status: Married    Spouse Name: N/A  . Number of Children: 3  . Years of Education: N/A   Occupational History  . [employer]    Social History Main Topics  . Smoking status: Current Every Day Smoker -- 1.00 packs/day for 21 years    Types: Cigarettes  . Smokeless tobacco: Former Systems developer    Types: Snuff     Comment: Counseling sheet given in exam room   . Alcohol Use: No  . Drug Use: No  . Sexual Activity: Yes   Other Topics Concern  . None   Social History Narrative   Married. Education: [employer].      Review of Systems  Constitutional: Negative for fever, chills and appetite change.  HENT: Positive for congestion, nosebleeds, postnasal drip, sinus pressure and sore throat. Negative for  ear pain.   Eyes: Negative for pain and redness.  Respiratory: Positive for cough. Negative for shortness of breath and wheezing.   Cardiovascular: Negative for leg swelling.  Gastrointestinal: Negative for nausea, vomiting, abdominal pain, diarrhea, constipation and blood in stool.  Endocrine: Negative for polyuria.  Genitourinary: Negative for  dysuria, urgency, frequency and flank pain.  Musculoskeletal: Negative for gait problem.  Skin: Negative for rash.  Neurological: Negative for weakness and headaches.  Psychiatric/Behavioral: Negative for confusion and decreased concentration. The patient is not nervous/anxious.     Objective:  BP 142/102 mmHg  Pulse 99  Temp(Src) 98.5 F (36.9 C) (Oral)  Resp 16  Ht _0  (1.854 m)  Wt 302 lb (136.986 kg)  BMI 39.85 kg/m2  SpO2 98%  Physical Exam  Constitutional: He is oriented to person, place, and time. He appears well-developed and well-nourished. No distress.  HENT:  Head: Normocephalic and atraumatic.  Right Ear: External ear normal.  Left Ear: External ear normal.  Nose: Nose normal.  Eyes: Conjunctivae and EOM are normal. Pupils are equal, round, and reactive to light. No scleral icterus.  Neck: Normal range of motion. Neck supple. No tracheal deviation present.  Cardiovascular: Normal rate, regular rhythm and normal heart sounds.   Pulmonary/Chest: Effort normal. No respiratory distress. He has no wheezes. He has no rales.  Abdominal: He exhibits no mass. There is no tenderness. There is no rebound and no guarding.  Musculoskeletal: He exhibits no edema.  Lymphadenopathy:    He has no cervical adenopathy.  Neurological: He is alert and oriented to person, place, and time. Coordination normal.  Skin: Skin is warm and dry. No rash noted.  Psychiatric: He has a normal mood and affect. His behavior is normal. Thought content normal.      Assessment & Plan:   Arend was seen today for nasal congestion, nose bleed, hoarse and cough.  Diagnoses and all orders for this visit:  Acute bronchitis, unspecified organism  Acute pansinusitis, recurrence not specified  Essential hypertension, benign  Other orders -     chlorpheniramine-HYDROcodone (TUSSIONEX PENNKINETIC ER) 10-8 MG/5ML SUER; Take 5 mLs by mouth 2 (two) times daily. -     amoxicillin-clavulanate  (AUGMENTIN) 875-125 MG tablet; Take 1 tablet by mouth 2 (two) times daily.  I am having Craig Guzman start on chlorpheniramine-HYDROcodone and amoxicillin-clavulanate. I am also having him maintain his cyclobenzaprine, lisinopril-hydrochlorothiazide, verapamil, omeprazole-sodium bicarbonate, prochlorperazine, and Certolizumab Pegol.  Meds ordered this encounter  Medications  . chlorpheniramine-HYDROcodone (TUSSIONEX PENNKINETIC ER) 10-8 MG/5ML SUER    Sig: Take 5 mLs by mouth 2 (two) times daily.    Dispense:  60 mL    Refill:  0  . amoxicillin-clavulanate (AUGMENTIN) 875-125 MG tablet    Sig: Take 1 tablet by mouth 2 (two) times daily.    Dispense:  20 tablet    Refill:  0    Appropriate red flag conditions were discussed with the patient as well as actions that should be taken.  Patient expressed his understanding.  Follow-up: Return if symptoms worsen or fail to improve.  Roselee Culver, MD

## 2015-11-03 ENCOUNTER — Other Ambulatory Visit: Payer: Self-pay | Admitting: Family Medicine

## 2016-03-10 ENCOUNTER — Ambulatory Visit (INDEPENDENT_AMBULATORY_CARE_PROVIDER_SITE_OTHER): Payer: Managed Care, Other (non HMO) | Admitting: Family Medicine

## 2016-03-10 VITALS — BP 156/98 | HR 60 | Temp 98.2°F | Resp 20 | Ht 71.5 in | Wt 299.4 lb

## 2016-03-10 DIAGNOSIS — I10 Essential (primary) hypertension: Secondary | ICD-10-CM | POA: Diagnosis not present

## 2016-03-10 DIAGNOSIS — K219 Gastro-esophageal reflux disease without esophagitis: Secondary | ICD-10-CM

## 2016-03-10 DIAGNOSIS — Z13220 Encounter for screening for lipoid disorders: Secondary | ICD-10-CM

## 2016-03-10 DIAGNOSIS — G43809 Other migraine, not intractable, without status migrainosus: Secondary | ICD-10-CM

## 2016-03-10 DIAGNOSIS — L84 Corns and callosities: Secondary | ICD-10-CM

## 2016-03-10 MED ORDER — OMEPRAZOLE-SODIUM BICARBONATE 40-1100 MG PO CAPS: 1.0000 | ORAL_CAPSULE | Freq: Every day | ORAL | 1 refills | Status: DC

## 2016-03-10 MED ORDER — LISINOPRIL-HYDROCHLOROTHIAZIDE 20-12.5 MG PO TABS: 2.0000 | ORAL_TABLET | Freq: Every day | ORAL | 1 refills | Status: DC

## 2016-03-10 MED ORDER — VERAPAMIL HCL ER 180 MG PO TBCR: EXTENDED_RELEASE_TABLET | ORAL | 1 refills | Status: DC

## 2016-03-10 MED ORDER — CYCLOBENZAPRINE HCL 10 MG PO TABS: 10.0000 mg | ORAL_TABLET | Freq: Three times a day (TID) | ORAL | 3 refills | Status: DC | PRN

## 2016-03-10 NOTE — Patient Instructions (Addendum)
  Same dose of blood pressure medications for now, follow up with me in 2 weeks to make sure that the levels are controlled. Bring a record of your outside readings.  I referred you to neurology for second opinion on migraine treatment. Flexeril was refilled for now.Return to the clinic or go to the nearest emergency room if any of your symptoms worsen or new symptoms occur.  The areas on her feet appear to be corns or less likely plantar warts. Try the over-the-counter corn treatment and pads as discussed, and recheck in 2 weeks for blood pressure as well as to examine those areas.  Return to the clinic or go to the nearest emergency room if any of your symptoms worsen or new symptoms occur.    IF you received an x-ray today, you will receive an invoice from [facility]. Please contact [facility] at [phone] with questions or concerns regarding your invoice.   IF you received labwork today, you will receive an invoice from [employer]. Please contact [facility] at [phone] with questions or concerns regarding your invoice.   Our billing staff will not be able to assist you with questions regarding bills from these companies.  You will be contacted with the lab results as soon as they are available. The fastest way to get your results is to activate your My Chart account. Instructions are located on the last page of this paperwork. If you have not heard from [place] regarding the results in 2 weeks, please contact this office.

## 2016-03-10 NOTE — Progress Notes (Signed)
Subjective:  By signing my name below, I, Moises Blood, attest that this documentation has been prepared under the direction and in the presence of Merri Ray, MD. Electronically Signed: Moises Blood, [place]. 03/10/2016 , 6:18 PM .  Patient was seen in Room 1 .   Patient ID: Craig Guzman, male    DOB: 1971-03-27, 45 y.o.   MRN: [id number] Chief Complaint  Patient presents with  . Medication Refill    Flexeril; Lisinopril/HCTZ; Zegrid; Calan-SR   HPI Craig Guzman is a 45 y.o. male Usual patient of mine, but hasn't been seen for some time. Here for refill of his medications. His last full meal was last night; though he did have some Cheetos earlier today.   HTN He's on lisinopril-HCTZ 2 tablets and Calan-SR 2 tablets daily.   Lab Results  Component Value Date   CREATININE 1.05 02/11/2015   His BP has been usually running around 130/90. He denies chest pain, shortness of breath, chronic cough, lightheadedness or dizziness.   Crohn's disease He has history of Crohn's disease. He's followed by [name] for this.   GERD He has history of GERD and takes Zegerid for it. He's also followed by [name] for this as well. He hasn't seen [name] in a while.   Headache  He's been having his usual migraine headaches, with similar issues in the past. He was previously seen by Dr. Earley Favor but after some complications, he has stopped going to him for follow up. He has an aura with his migraines where the front and back of his head will tense up. Afterwards, sound will become piercing along with light sensitivity with nausea. He takes flexeril prn for relief.   Foot pain He also mentions having some pain with scaling dried skin in the bottom of his feet bilaterally.   Family His son is currently studying Sports Medicine and playing football at [employer].   Patient Active Problem List   Diagnosis Date Noted  . Morbid obesity ([place]) 09/19/2013  . Diarrhea 07/10/2013  . 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 Hypertension 11/26/2012  . Gastroparesis??? 12/08/2011  . Migraine headache 12/08/2011  . Long-term use of immunosuppressant medication- Cimzia 08/08/2011  . Crohn's disease of stomach and colon-suspected 05/17/2011  . GERD (gastroesophageal reflux disease) 04/21/2011   Past Medical History:  Diagnosis Date  . Allergy    trees/ grasses/ animals/dust/mold  . Anxiety   . Biliary dyskinesia   . Colloid thyroid nodule   . Crohn's disease ([place])    Stomach, terminal ileum, cecum  . Gastric ulcer    antral  . GERD (gastroesophageal reflux disease)   . History of migraine headaches   . HTN (hypertension)   . Kidney stone 09/20/2012  . Obesity   . Pneumonia 02/27/2014   ED   Past Surgical History:  Procedure Laterality Date  . CHOLECYSTECTOMY  2011   [place]  . COLONOSCOPY  07/26/11   Crohn's colitis, ileitis  . ESOPHAGOGASTRODUODENOSCOPY  05/04/11, 07/26/11   granulomatous gastritis - Crohn's  . FLEXIBLE SIGMOIDOSCOPY    . JOINT REPLACEMENT    . MULTIPLE TOOTH EXTRACTIONS  2005  . SHOULDER SURGERY     right  . VASECTOMY     bilateral w/lysis of penile adhesions   Allergies  Allergen Reactions  . Zithromax [Azithromycin] Hypertension   Prior to Admission medications   Medication Sig Start Date End Date Taking? Authorizing Provider  Certolizumab Pegol (CIMZIA PREFILLED) 2 X 200 MG/ML KIT  Inject 400 mg into the skin every 30 (thirty) days. Inject 400 mg or 2 pens into skin once a month 04/14/15  Yes Gatha Mayer, MD  cyclobenzaprine (FLEXERIL) 10 MG tablet Take 1 tablet (10 mg total) by mouth 3 (three) times daily as needed. 11/26/12  Yes Robyn Haber, MD  lisinopril-hydrochlorothiazide (PRINZIDE,ZESTORETIC) 20-12.5 MG tablet TAKE 2 TABLETS BY MOUTH DAILY. 11/06/15  Yes Posey Boyer, MD  omeprazole-sodium bicarbonate (ZEGERID) 40-1100 MG capsule Take 1 capsule by mouth at bedtime. 04/07/15  Yes Gatha Mayer, MD  prochlorperazine (COMPRO) 25 MG  suppository Place 1 suppository (25 mg total) rectally every 12 (twelve) hours as needed for nausea or vomiting. 04/07/15  Yes Gatha Mayer, MD  verapamil (CALAN-SR) 180 MG CR tablet TAKE 2 TABLETS (360 MG TOTAL) BY MOUTH DAILY. 11/26/14  Yes Mancel Bale, PA-C   Social History   Social History  . Marital status: Married    Spouse name: N/A  . Number of children: 3  . Years of education: N/A   Occupational History  . [employer]    Social History Main Topics  . Smoking status: Current Every Day Smoker    Packs/day: 1.00    Years: 21.00    Types: Cigarettes  . Smokeless tobacco: Former Systems developer    Types: Snuff     Comment: Counseling sheet given in exam room   . Alcohol use No  . Drug use: No  . Sexual activity: Yes   Other Topics Concern  . Not on file   Social History Narrative   Married. Education: [employer].    Review of Systems  Constitutional: Negative for fatigue and unexpected weight change.  Eyes: Negative for visual disturbance.  Respiratory: Negative for cough, chest tightness and shortness of breath.   Cardiovascular: Negative for chest pain, palpitations and leg swelling.  Gastrointestinal: Negative for abdominal pain and blood in stool.  Musculoskeletal: Negative for gait problem and myalgias.  Skin:       Dried scaly skin, bottom of his feet  Neurological: Positive for headaches. Negative for dizziness and light-headedness.       Objective:   Physical Exam  Constitutional: He is oriented to person, place, and time. He appears well-developed and well-nourished.  HENT:  Head: Normocephalic and atraumatic.  Eyes: EOM are normal. Pupils are equal, round, and reactive to light.  Neck: No JVD present. Carotid bruit is not present.  Cardiovascular: Normal rate, regular rhythm and normal heart sounds.   No murmur heard. Pulmonary/Chest: Effort normal and breath sounds normal. He has no rales.  Musculoskeletal: He exhibits no edema.  Neurological: He  is alert and oriented to person, place, and time.  Skin: Skin is warm and dry.  Dried skin in both feet: Left foot: small possible corn in the arch area of his left foot, no surrounding erythema with slightly thickened callus Right foot: similar appearing lesion in his left foot at the base of the 4th or 5th metatarsal distal aspect with slight verruca appearance  Psychiatric: He has a normal mood and affect.  Vitals reviewed.   Vitals:   03/10/16 1726  BP: (!) 156/98  Pulse: 60  Resp: 20  Temp: 98.2 F (36.8 C)  TempSrc: Oral  SpO2: 98%  Weight: 299 lb 6.4 oz (135.8 kg)  Height: 5' 11.5" (1.816 m)       Assessment & Plan:    Craig Guzman is a 45 y.o. male Essential hypertension - Plan: lisinopril-hydrochlorothiazide ([place])  20-12.5 MG tablet, verapamil (CALAN-SR) 180 MG CR tablet, COMPLETE METABOLIC PANEL WITH GFR, Lipid panel  -Decreased control, but in setting of headache, may be slightly elevated. Monitor home blood pressure readings, recheck in the next few weeks as headache improves to see if additional medication changes needed. Sooner if new symptoms.  Other migraine without status migrainosus, not intractable - Plan: Ambulatory referral to Neurology, cyclobenzaprine (FLEXERIL) 10 MG tablet  - Refilled Flexeril as this has helped his symptoms in the past. Indicating possible tension component. Requested new headache specialist. Neuro referral placed.  Gastroesophageal reflux disease, esophagitis presence not specified - Plan: omeprazole-sodium bicarbonate (ZEGERID) 40-1100 MG capsule  -Continue Zegerid. Discussed if this is not covered, can discuss either with his gastroneurologist or consider other PPI.  Corn of foot  -Suspected corns on feet versus plantar wart. Over-the-counter salicylic acid and corn treatment, recheck in 2 weeks.  Screening for hyperlipidemia - Plan: COMPLETE METABOLIC PANEL WITH GFR, Lipid panel   Meds ordered this encounter    Medications  . cyclobenzaprine (FLEXERIL) 10 MG tablet    Sig: Take 1 tablet (10 mg total) by mouth 3 (three) times daily as needed.    Dispense:  30 tablet    Refill:  3  . lisinopril-hydrochlorothiazide (PRINZIDE,ZESTORETIC) 20-12.5 MG tablet    Sig: Take 2 tablets by mouth daily.    Dispense:  180 tablet    Refill:  1  . omeprazole-sodium bicarbonate (ZEGERID) 40-1100 MG capsule    Sig: Take 1 capsule by mouth at bedtime.    Dispense:  90 capsule    Refill:  1  . verapamil (CALAN-SR) 180 MG CR tablet    Sig: TAKE 2 TABLETS (360 MG TOTAL) BY MOUTH DAILY.    Dispense:  180 tablet    Refill:  1   Patient Instructions     Same dose of blood pressure medications for now, follow up with me in 2 weeks to make sure that the levels are controlled. Bring a record of your outside readings.  I referred you to neurology for second opinion on migraine treatment. Flexeril was refilled for now.Return to the clinic or go to the nearest emergency room if any of your symptoms worsen or new symptoms occur.  The areas on her feet appear to be corns or less likely plantar warts. Try the over-the-counter corn treatment and pads as discussed, and recheck in 2 weeks for blood pressure as well as to examine those areas.  Return to the clinic or go to the nearest emergency room if any of your symptoms worsen or new symptoms occur.    IF you received an x-ray today, you will receive an invoice from [facility]. Please contact [facility] at [phone] with questions or concerns regarding your invoice.   IF you received labwork today, you will receive an invoice from [facility]. Please contact [facility] at [phone] with questions or concerns regarding your invoice.   Our billing staff will not be able to assist you with questions regarding bills from these companies.  You will be contacted with the lab results as soon as they are available. The fastest  way to get your results is to activate your My Chart account. Instructions are located on the last page of this paperwork. If you have not heard from [place] regarding the results in 2 weeks, please contact this office.        I personally performed the services described in this documentation, which was scribed  in my presence. The recorded information has been reviewed and considered, and addended by me as needed.   Signed,   Merri Ray, MD [facility].  03/12/16 1:18 PM

## 2016-03-11 LAB — LIPID PANEL
Cholesterol: 161 mg/dL (ref 125–200)
HDL: 31 mg/dL — AB (ref >=40)
LDL CALC: 98 mg/dL (ref <130)
TRIGLYCERIDES: 162 mg/dL — AB (ref <150)
Total CHOL/HDL Ratio: 5.2 Ratio — ABNORMAL HIGH (ref <=5.0)
VLDL: 32 mg/dL — AB (ref <30)

## 2016-03-11 LAB — COMPLETE METABOLIC PANEL WITH GFR
ALT: 26 U/L (ref 9–46)
AST: 23 U/L (ref 10–40)
Albumin: 4.1 g/dL (ref 3.6–5.1)
Alkaline Phosphatase: 97 U/L (ref 40–115)
BUN: 11 mg/dL (ref 7–25)
CHLORIDE: 103 mmol/L (ref 98–110)
CO2: 23 mmol/L (ref 20–31)
CREATININE: 1.17 mg/dL (ref 0.60–1.35)
Calcium: 9.1 mg/dL (ref 8.6–10.3)
GFR, EST AFRICAN AMERICAN: 87 mL/min (ref >=60)
GFR, EST NON AFRICAN AMERICAN: 75 mL/min (ref >=60)
Glucose, Bld: 89 mg/dL (ref 65–99)
Potassium: 4.1 mmol/L (ref 3.5–5.3)
Sodium: 141 mmol/L (ref 135–146)
Total Bilirubin: 0.6 mg/dL (ref 0.2–1.2)
Total Protein: 7.5 g/dL (ref 6.1–8.1)

## 2016-03-16 ENCOUNTER — Telehealth: Payer: Self-pay

## 2016-03-16 NOTE — Telephone Encounter (Signed)
PA approved on covermymeds for Zegerid 40 mg QD through 07/02/16. Notified pharm.

## 2016-03-25 ENCOUNTER — Ambulatory Visit (INDEPENDENT_AMBULATORY_CARE_PROVIDER_SITE_OTHER): Payer: Managed Care, Other (non HMO) | Admitting: Family Medicine

## 2016-03-25 ENCOUNTER — Encounter: Payer: Self-pay | Admitting: Family Medicine

## 2016-03-25 VITALS — BP 160/112 | HR 84 | Temp 98.0°F | Resp 18 | Ht 71.5 in | Wt 306.0 lb

## 2016-03-25 DIAGNOSIS — J069 Acute upper respiratory infection, unspecified: Secondary | ICD-10-CM

## 2016-03-25 DIAGNOSIS — I10 Essential (primary) hypertension: Secondary | ICD-10-CM | POA: Diagnosis not present

## 2016-03-25 DIAGNOSIS — R351 Nocturia: Secondary | ICD-10-CM

## 2016-03-25 DIAGNOSIS — E669 Obesity, unspecified: Secondary | ICD-10-CM | POA: Diagnosis not present

## 2016-03-25 DIAGNOSIS — G4710 Hypersomnia, unspecified: Secondary | ICD-10-CM

## 2016-03-25 DIAGNOSIS — R400 Somnolence: Secondary | ICD-10-CM

## 2016-03-25 DIAGNOSIS — R0683 Snoring: Secondary | ICD-10-CM

## 2016-03-25 LAB — PSA: PSA: 0.2 ng/mL (ref <=4.0)

## 2016-03-25 LAB — POC MICROSCOPIC URINALYSIS (UMFC)

## 2016-03-25 LAB — POCT URINALYSIS DIP (MANUAL ENTRY)
Glucose, UA: NEGATIVE
Leukocytes, UA: NEGATIVE
NITRITE UA: NEGATIVE
PH UA: 5.5
RBC UA: NEGATIVE
Spec Grav, UA: 1.025
Urobilinogen, UA: 1

## 2016-03-25 MED ORDER — DOXAZOSIN MESYLATE 1 MG PO TABS: 1.0000 mg | ORAL_TABLET | Freq: Every day | ORAL | 1 refills | Status: DC

## 2016-03-25 NOTE — Patient Instructions (Addendum)
The decongestant use last night may be part of the reason your blood pressure was elevated today. Avoid cold medicines other than saline or salt water nasal spray, drink plenty of fluids and rest, sore throat lozenges or Mucinex is okay.   Check your blood pressure outside of the office off of decongestants. I did start additional medication to take at night. It may help blood pressure and nighttime urination. If pressure over 140/90 in the next 2 weeks, return to further discuss medications. I also referred you for sleep study as sleep apnea may be causing your blood pressure to elevate.  Avoid fast food, minimize salt intake as much as possible. I included a "dash" eating plan about foods to eat with blood pressure.  I will check a PSA blood test as well as a urine test to look for other causes of nighttime urination, but try to drink fluids in the earlier part of the day and not right before bedtime.  Return to the clinic or go to the nearest emergency room if any of your symptoms worsen or new symptoms occur.   DASH Eating Plan DASH stands for "Dietary Approaches to Stop Hypertension." The DASH eating plan is a healthy eating plan that has been shown to reduce high blood pressure (hypertension). Additional health benefits may include reducing the risk of type 2 diabetes mellitus, heart disease, and stroke. The DASH eating plan may also help with weight loss. WHAT DO I NEED TO KNOW ABOUT THE DASH EATING PLAN? For the DASH eating plan, you will follow these general guidelines:  Choose foods with a percent daily value for sodium of less than 5% (as listed on the food label).  Use salt-free seasonings or herbs instead of table salt or sea salt.  Check with your health care provider or pharmacist before using salt substitutes.  Eat lower-sodium products, often labeled as "lower sodium" or "no salt added."  Eat fresh foods.  Eat more vegetables, fruits, and low-fat dairy products.  Choose  whole grains. Look for the word "whole" as the first word in the ingredient list.  Choose fish and skinless chicken or [place] more often than red meat. Limit fish, poultry, and meat to 6 oz (170 g) each day.  Limit sweets, desserts, sugars, and sugary drinks.  Choose heart-healthy fats.  Limit cheese to 1 oz (28 g) per day.  Eat more home-cooked food and less restaurant, buffet, and fast food.  Limit fried foods.  Cook foods using methods other than frying.  Limit canned vegetables. If you do use them, rinse them well to decrease the sodium.  When eating at a restaurant, ask that your food be prepared with less salt, or no salt if possible. WHAT FOODS CAN I EAT? Seek help from a dietitian for individual calorie needs. Grains Whole grain or whole wheat bread. Brown rice. Whole grain or whole wheat pasta. Quinoa, bulgur, and whole grain cereals. Low-sodium cereals. Corn or whole wheat flour tortillas. Whole grain cornbread. Whole grain crackers. Low-sodium crackers. Vegetables Fresh or frozen vegetables (raw, steamed, roasted, or grilled). Low-sodium or reduced-sodium tomato and vegetable juices. Low-sodium or reduced-sodium tomato sauce and paste. Low-sodium or reduced-sodium canned vegetables.  Fruits All fresh, canned (in natural juice), or frozen fruits. Meat and Other Protein Products Ground beef (85% or leaner), grass-fed beef, or beef trimmed of fat. Skinless chicken or [place]. Ground chicken or [place]. Pork trimmed of fat. All fish and seafood. Eggs. Dried beans, peas, or lentils. Unsalted nuts and seeds.  Unsalted canned beans. Dairy Low-fat dairy products, such as skim or 1% milk, 2% or reduced-fat cheeses, low-fat ricotta or cottage cheese, or plain low-fat yogurt. Low-sodium or reduced-sodium cheeses. Fats and Oils Tub margarines without trans fats. Light or reduced-fat mayonnaise and salad dressings (reduced sodium). Avocado. Safflower, olive, or canola oils. Natural peanut  or almond butter. Other Unsalted popcorn and pretzels. The items listed above may not be a complete list of recommended foods or beverages. Contact your dietitian for more options. WHAT FOODS ARE NOT RECOMMENDED? Grains White bread. White pasta. White rice. Refined cornbread. Bagels and croissants. Crackers that contain trans fat. Vegetables Creamed or fried vegetables. Vegetables in a cheese sauce. Regular canned vegetables. Regular canned tomato sauce and paste. Regular tomato and vegetable juices. Fruits Dried fruits. Canned fruit in light or heavy syrup. Fruit juice. Meat and Other Protein Products Fatty cuts of meat. Ribs, chicken wings, bacon, sausage, bologna, salami, chitterlings, fatback, hot dogs, bratwurst, and packaged luncheon meats. Salted nuts and seeds. Canned beans with salt. Dairy Whole or 2% milk, cream, half-and-half, and cream cheese. Whole-fat or sweetened yogurt. Full-fat cheeses or blue cheese. Nondairy creamers and whipped toppings. Processed cheese, cheese spreads, or cheese curds. Condiments Onion and garlic salt, seasoned salt, table salt, and sea salt. Canned and packaged gravies. Worcestershire sauce. Tartar sauce. Barbecue sauce. Teriyaki sauce. Soy sauce, including reduced sodium. Steak sauce. Fish sauce. Oyster sauce. Cocktail sauce. Horseradish. Ketchup and mustard. Meat flavorings and tenderizers. Bouillon cubes. Hot sauce. Tabasco sauce. Marinades. Taco seasonings. Relishes. Fats and Oils Butter, stick margarine, lard, shortening, ghee, and bacon fat. Coconut, palm kernel, or palm oils. Regular salad dressings. Other Pickles and olives. Salted popcorn and pretzels. The items listed above may not be a complete list of foods and beverages to avoid. Contact your dietitian for more information. WHERE CAN I FIND MORE INFORMATION? National Heart, Lung, and Blood Institute: travelstabloid.com   This information is not intended to  replace advice given to you by your health care provider. Make sure you discuss any questions you have with your health care provider.   Document Released: 06/08/2011 Document Revised: 07/10/2014 Document Reviewed: 04/23/2013 Elsevier Interactive Patient Education 2016 [employer].  Hypertension Hypertension, commonly called high blood pressure, is when the force of blood pumping through your arteries is too strong. Your arteries are the blood vessels that carry blood from your heart throughout your body. A blood pressure reading consists of a higher number over a lower number, such as 110/72. The higher number (systolic) is the pressure inside your arteries when your heart pumps. The lower number (diastolic) is the pressure inside your arteries when your heart relaxes. Ideally you want your blood pressure below 120/80. Hypertension forces your heart to work harder to pump blood. Your arteries may become narrow or stiff. Having untreated or uncontrolled hypertension can cause heart attack, stroke, kidney disease, and other problems. RISK FACTORS Some risk factors for high blood pressure are controllable. Others are not.  Risk factors you cannot control include:   Race. You may be at higher risk if you are African American.  Age. Risk increases with age.  Gender. Men are at higher risk than women before age 16 years. After age 48, women are at higher risk than men. Risk factors you can control include:  Not getting enough exercise or physical activity.  Being overweight.  Getting too much fat, sugar, calories, or salt in your diet.  Drinking too much alcohol. SIGNS AND SYMPTOMS Hypertension does not  usually cause signs or symptoms. Extremely high blood pressure (hypertensive crisis) may cause headache, anxiety, shortness of breath, and nosebleed. DIAGNOSIS To check if you have hypertension, your health care provider will measure your blood pressure while you are seated, with your arm  held at the level of your heart. It should be measured at least twice using the same arm. Certain conditions can cause a difference in blood pressure between your right and left arms. A blood pressure reading that is higher than normal on one occasion does not mean that you need treatment. If it is not clear whether you have high blood pressure, you may be asked to return on a different day to have your blood pressure checked again. Or, you may be asked to monitor your blood pressure at home for 1 or more weeks. TREATMENT Treating high blood pressure includes making lifestyle changes and possibly taking medicine. Living a healthy lifestyle can help lower high blood pressure. You may need to change some of your habits. Lifestyle changes may include:  Following the DASH diet. This diet is high in fruits, vegetables, and whole grains. It is low in salt, red meat, and added sugars.  Keep your sodium intake below 2,300 mg per day.  Getting at least 30-45 minutes of aerobic exercise at least 4 times per week.  Losing weight if necessary.  Not smoking.  Limiting alcoholic beverages.  Learning ways to reduce stress. Your health care provider may prescribe medicine if lifestyle changes are not enough to get your blood pressure under control, and if one of the following is true:  You are 61-6 years of age and your systolic blood pressure is above 140.  You are 68 years of age or older, and your systolic blood pressure is above 150.  Your diastolic blood pressure is above 90.  You have diabetes, and your systolic blood pressure is over 419 or your diastolic blood pressure is over 90.  You have kidney disease and your blood pressure is above 140/90.  You have heart disease and your blood pressure is above 140/90. Your personal target blood pressure may vary depending on your medical conditions, your age, and other factors. HOME CARE INSTRUCTIONS  Have your blood pressure rechecked as directed  by your health care provider.   Take medicines only as directed by your health care provider. Follow the directions carefully. Blood pressure medicines must be taken as prescribed. The medicine does not work as well when you skip doses. Skipping doses also puts you at risk for problems.  Do not smoke.   Monitor your blood pressure at home as directed by your health care provider. SEEK MEDICAL CARE IF:   You think you are having a reaction to medicines taken.  You have recurrent headaches or feel dizzy.  You have swelling in your ankles.  You have trouble with your vision. SEEK IMMEDIATE MEDICAL CARE IF:  You develop a severe headache or confusion.  You have unusual weakness, numbness, or feel faint.  You have severe chest or abdominal pain.  You vomit repeatedly.  You have trouble breathing. MAKE SURE YOU:   Understand these instructions.  Will watch your condition.  Will get help right away if you are not doing well or get worse.   This information is not intended to replace advice given to you by your health care provider. Make sure you discuss any questions you have with your health care provider.   Document Released: 06/19/2005 Document Revised: 11/03/2014 Document Reviewed:  04/11/2013 Elsevier Interactive Patient Education 2016 [employer]  Upper Respiratory Infection, Adult Most upper respiratory infections (URIs) are a viral infection of the air passages leading to the lungs. A URI affects the nose, throat, and upper air passages. The most common type of URI is nasopharyngitis and is typically referred to as "the common cold." URIs run their course and usually go away on their own. Most of the time, a URI does not require medical attention, but sometimes a bacterial infection in the upper airways can follow a viral infection. This is called a secondary infection. Sinus and middle ear infections are common types of secondary upper respiratory  infections. Bacterial pneumonia can also complicate a URI. A URI can worsen asthma and chronic obstructive pulmonary disease (COPD). Sometimes, these complications can require emergency medical care and may be life threatening.  CAUSES Almost all URIs are caused by viruses. A virus is a type of germ and can spread from one person to another.  RISKS FACTORS You may be at risk for a URI if:   You smoke.   You have chronic heart or lung disease.  You have a weakened defense (immune) system.   You are very young or very old.   You have nasal allergies or asthma.  You work in crowded or poorly ventilated areas.  You work in health care facilities or schools. SIGNS AND SYMPTOMS  Symptoms typically develop 2-3 days after you come in contact with a cold virus. Most viral URIs last 7-10 days. However, viral URIs from the influenza virus (flu virus) can last 14-18 days and are typically more severe. Symptoms may include:   Runny or stuffy (congested) nose.   Sneezing.   Cough.   Sore throat.   Headache.   Fatigue.   Fever.   Loss of appetite.   Pain in your forehead, behind your eyes, and over your cheekbones (sinus pain).  Muscle aches.  DIAGNOSIS  Your health care provider may diagnose a URI by:  Physical exam.  Tests to check that your symptoms are not due to another condition such as:  Strep throat.  Sinusitis.  Pneumonia.  Asthma. TREATMENT  A URI goes away on its own with time. It cannot be cured with medicines, but medicines may be prescribed or recommended to relieve symptoms. Medicines may help:  Reduce your fever.  Reduce your cough.  Relieve nasal congestion. HOME CARE INSTRUCTIONS   Take medicines only as directed by your health care provider.   Gargle warm saltwater or take cough drops to comfort your throat as directed by your health care provider.  Use a warm mist humidifier or inhale steam from a shower to increase air moisture.  This may make it easier to breathe.  Drink enough fluid to keep your urine clear or pale yellow.   Eat soups and other clear broths and maintain good nutrition.   Rest as needed.   Return to work when your temperature has returned to normal or as your health care provider advises. You may need to stay home longer to avoid infecting others. You can also use a face mask and careful hand washing to prevent spread of the virus.  Increase the usage of your inhaler if you have asthma.   Do not use any tobacco products, including cigarettes, chewing tobacco, or electronic cigarettes. If you need help quitting, ask your health care provider. PREVENTION  The best way to protect yourself from getting a cold is to practice good hygiene.  Avoid oral or hand contact with people with cold symptoms.   Wash your hands often if contact occurs.  There is no clear evidence that vitamin C, vitamin E, echinacea, or exercise reduces the chance of developing a cold. However, it is always recommended to get plenty of rest, exercise, and practice good nutrition.  SEEK MEDICAL CARE IF:   You are getting worse rather than better.   Your symptoms are not controlled by medicine.   You have chills.  You have worsening shortness of breath.  You have brown or red mucus.  You have yellow or brown nasal discharge.  You have pain in your face, especially when you bend forward.  You have a fever.  You have swollen neck glands.  You have pain while swallowing.  You have white areas in the back of your throat. SEEK IMMEDIATE MEDICAL CARE IF:   You have severe or persistent:  Headache.  Ear pain.  Sinus pain.  Chest pain.  You have chronic lung disease and any of the following:  Wheezing.  Prolonged cough.  Coughing up blood.  A change in your usual mucus.  You have a stiff neck.  You have changes in your:  Vision.  Hearing.  Thinking.  Mood. MAKE SURE YOU:    Understand these instructions.  Will watch your condition.  Will get help right away if you are not doing well or get worse.   This information is not intended to replace advice given to you by your health care provider. Make sure you discuss any questions you have with your health care provider.   Document Released: 12/13/2000 Document Revised: 11/03/2014 Document Reviewed: 09/24/2013 Elsevier Interactive Patient Education 2016 [employer].    IF you received an x-ray today, you will receive an invoice from [facility]. Please contact [facility] at [phone] with questions or concerns regarding your invoice.   IF you received labwork today, you will receive an invoice from [employer]. Please contact [facility] at [phone] with questions or concerns regarding your invoice.   Our billing staff will not be able to assist you with questions regarding bills from these companies.  You will be contacted with the lab results as soon as they are available. The fastest way to get your results is to activate your My Chart account. Instructions are located on the last page of this paperwork. If you have not heard from [place] regarding the results in 2 weeks, please contact this office.

## 2016-03-25 NOTE — Progress Notes (Signed)
Subjective:    Patient ID: Craig Guzman, male    DOB: 1970/10/21, 45 y.o.   MRN: [id number]  HPI Craig Guzman is a 45 y.o. male  Hypertension:  - See last office visit on September 8th. Blood pressure 156/98 at that time, but he was also experiencing some migraine/headache symptoms. He was continued on same regimen at that time.  - missed one day's worth of meds last week, but that was 4 days ago. Otherwise taking meds qd. No home readings. Slight HA one day, not now.   -sleep study 10 years ago, told was normal. Occasional snoring, choking sensation in night, has vomited in sleep. Some daytime somnolence.  - 3-4 episodes nocturia per nigh    Lab Results  Component Value Date   CREATININE 1.17 03/10/2016   Cold symptoms. Started 3 days ago, with nasal congestion. Sinus congestion and pressure, PND,  No fever.  Cough, rare sneeze. Scratchy throat, and change in voice yesterday.  Sick contacts with similar sx's.   Tx: otc coricidin, then theraflu last night (containing phenelphrine, dextromethorphan, guaifenesin and tylenol).  Hx of obesity Wt Readings from Last 3 Encounters:  03/25/16 (!) 306 lb (138.8 kg)  03/10/16 299 lb 6.4 oz (135.8 kg)  06/07/15 (!) 302 lb (137 kg)    Patient Active Problem List   Diagnosis Date Noted  . Morbid obesity ([place]) 09/19/2013  . Diarrhea 07/10/2013  . 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 Hypertension 11/26/2012  . Gastroparesis??? 12/08/2011  . Migraine headache 12/08/2011  . Long-term use of immunosuppressant medication- Cimzia 08/08/2011  . Crohn's disease of stomach and colon-suspected 05/17/2011  . GERD (gastroesophageal reflux disease) 04/21/2011   Past Medical History:  Diagnosis Date  . Allergy    trees/ grasses/ animals/dust/mold  . Anxiety   . Biliary dyskinesia   . Colloid thyroid nodule   . Crohn's disease ([place])    Stomach, terminal ileum, cecum  . Gastric ulcer    antral  . GERD (gastroesophageal reflux disease)   .  History of migraine headaches   . HTN (hypertension)   . Kidney stone 09/20/2012  . Obesity   . Pneumonia 02/27/2014   ED   Past Surgical History:  Procedure Laterality Date  . CHOLECYSTECTOMY  2011   [place]  . COLONOSCOPY  07/26/11   Crohn's colitis, ileitis  . ESOPHAGOGASTRODUODENOSCOPY  05/04/11, 07/26/11   granulomatous gastritis - Crohn's  . FLEXIBLE SIGMOIDOSCOPY    . JOINT REPLACEMENT    . MULTIPLE TOOTH EXTRACTIONS  2005  . SHOULDER SURGERY     right  . VASECTOMY     bilateral w/lysis of penile adhesions   Allergies  Allergen Reactions  . Zithromax [Azithromycin] Hypertension   Prior to Admission medications   Medication Sig Start Date End Date Taking? Authorizing Provider  Certolizumab Pegol (CIMZIA PREFILLED) 2 X 200 MG/ML KIT Inject 400 mg into the skin every 30 (thirty) days. Inject 400 mg or 2 pens into skin once a month 04/14/15  Yes Gatha Mayer, MD  cyclobenzaprine (FLEXERIL) 10 MG tablet Take 1 tablet (10 mg total) by mouth 3 (three) times daily as needed. 03/10/16  Yes [name], MD  lisinopril-hydrochlorothiazide (PRINZIDE,ZESTORETIC) 20-12.5 MG tablet Take 2 tablets by mouth daily. 03/10/16  Yes [name], MD  omeprazole-sodium bicarbonate (ZEGERID) 40-1100 MG capsule Take 1 capsule by mouth at bedtime. 03/10/16  Yes [name], MD  prochlorperazine (COMPRO) 25 MG suppository Place 1 suppository (25 mg total)  rectally every 12 (twelve) hours as needed for nausea or vomiting. 04/07/15  Yes Gatha Mayer, MD  verapamil (CALAN-SR) 180 MG CR tablet TAKE 2 TABLETS (360 MG TOTAL) BY MOUTH DAILY. 03/10/16  Yes [name], MD   Social History   Social History  . Marital status: Married    Spouse name: N/A  . Number of children: 3  . Years of education: N/A   Occupational History  . [employer]    Social History Main Topics  . Smoking status: Current Every Day Smoker    Packs/day: 1.00    Years: 21.00    Types: Cigarettes  .  Smokeless tobacco: Former Systems developer    Types: Snuff     Comment: Counseling sheet given in exam room   . Alcohol use No  . Drug use: No  . Sexual activity: Yes   Other Topics Concern  . Not on file   Social History Narrative   Married. Education: [employer].       Review of Systems  Constitutional: Negative for chills, fatigue, fever and unexpected weight change.  HENT: Positive for congestion, postnasal drip, sinus pressure and sneezing.   Eyes: Negative for visual disturbance.  Respiratory: Positive for cough and choking (occasional night). Negative for chest tightness and shortness of breath.   Cardiovascular: Negative for chest pain, palpitations and leg swelling.  Gastrointestinal: Negative for abdominal pain and blood in stool.  Endocrine: Positive for polyuria.  Genitourinary:       Drinks fluids at night, but increased nocturia for years.  3-4 episodes per night. No recent changes.   Neurological: Negative for dizziness, light-headedness and headaches (minimal ).       Objective:   Physical Exam  Constitutional: He is oriented to person, place, and time. He appears well-developed and well-nourished.  HENT:  Head: Normocephalic and atraumatic.  Right Ear: Tympanic membrane, external ear and ear canal normal.  Left Ear: Tympanic membrane, external ear and ear canal normal.  Nose: No rhinorrhea.  Mouth/Throat: Oropharynx is clear and moist and mucous membranes are normal. No oropharyngeal exudate or posterior oropharyngeal erythema.  Eyes: Conjunctivae and EOM are normal. Pupils are equal, round, and reactive to light.  Neck: Normal range of motion. Neck supple. No JVD present. Carotid bruit is not present. No thyromegaly present.  Cardiovascular: Normal rate, regular rhythm, normal heart sounds and intact distal pulses.   No murmur heard. Pulmonary/Chest: Effort normal and breath sounds normal. No respiratory distress. He has no wheezes. He has no rhonchi. He has no rales.    Abdominal: Soft. He exhibits no distension. There is no tenderness.  Genitourinary: Prostate normal.  Lymphadenopathy:    He has no cervical adenopathy.  Neurological: He is alert and oriented to person, place, and time. He has normal reflexes.  Skin: Skin is warm and dry. No rash noted.  Psychiatric: He has a normal mood and affect. His behavior is normal.  Vitals reviewed.  Vitals:   03/25/16 1219  BP: (!) 160/110  Pulse: 84  Resp: 18  Temp: 98 F (36.7 C)  TempSrc: Oral  SpO2: 98%  Weight: (!) 306 lb (138.8 kg)  Height: 5' 11.5" (1.816 m)   Results for orders placed or performed in visit on 03/10/16  COMPLETE METABOLIC PANEL WITH GFR  Result Value Ref Range   Sodium 141 135 - 146 mmol/L   Potassium 4.1 3.5 - 5.3 mmol/L   Chloride 103 98 - 110 mmol/L   CO2 23 20 -  31 mmol/L   Glucose, Bld 89 65 - 99 mg/dL   BUN 11 7 - 25 mg/dL   Creat 1.17 0.60 - 1.35 mg/dL   Total Bilirubin 0.6 0.2 - 1.2 mg/dL   Alkaline Phosphatase 97 40 - 115 U/L   AST 23 10 - 40 U/L   ALT 26 9 - 46 U/L   Total Protein 7.5 6.1 - 8.1 g/dL   Albumin 4.1 3.6 - 5.1 g/dL   Calcium 9.1 8.6 - 10.3 mg/dL   GFR, Est African American 87 >=60 mL/min   GFR, Est Non African American 75 >=60 mL/min  Lipid panel  Result Value Ref Range   Cholesterol 161 125 - 200 mg/dL   Triglycerides 162 (H) <150 mg/dL   HDL 31 (L) >=40 mg/dL   Total CHOL/HDL Ratio 5.2 (H) <=5.0 Ratio   VLDL 32 (H) <30 mg/dL   LDL Cholesterol 98 <130 mg/dL         Assessment & Plan:    Kendrik Mcshan is a 45 y.o. male Essential hypertension - Plan: POCT urinalysis dipstick, doxazosin (CARDURA) 1 MG tablet  - Decongestant use night prior, and one missed day of medications earlier in the week, but suspect blood pressure is still running too high even with these factors.  -start Cardura low-dose given his nighttime urination symptoms. Recheck in the next 2 weeks, sooner or to ER if symptomatic with elevated blood pressures.    Obesity  -Diet, increase activity, portion control and avoidance of fast food/calorie containing beverages. Can discuss other options at future visit.  Nocturia - Plan: PSA, POCT urinalysis dipstick, POCT Microscopic Urinalysis ([facility]), doxazosin (CARDURA) 1 MG tablet  - Check PSA, may be related to HCTZ. May improve with Cardura. Follow-up to discuss further next few weeks.  Acute upper respiratory infection  -Likely viral illness, symptomatic care discussed (avoid decongestants), RTC precautions  Daytime somnolence - Plan: Ambulatory referral to Sleep Studies Snoring - Plan: Ambulatory referral to Sleep Studies  - Refer for sleep study/eval for of certain sleep apnea as this may also be contributed to difficult to treat hypertension.   Meds ordered this encounter  Medications  . doxazosin (CARDURA) 1 MG tablet    Sig: Take 1 tablet (1 mg total) by mouth daily.    Dispense:  30 tablet    Refill:  1   Patient Instructions    The decongestant use last night may be part of the reason your blood pressure was elevated today. Avoid cold medicines other than saline or salt water nasal spray, drink plenty of fluids and rest, sore throat lozenges or Mucinex is okay.   Check your blood pressure outside of the office off of decongestants. I did start additional medication to take at night. It may help blood pressure and nighttime urination. If pressure over 140/90 in the next 2 weeks, return to further discuss medications. I also referred you for sleep study as sleep apnea may be causing your blood pressure to elevate.  Avoid fast food, minimize salt intake as much as possible. I included a "dash" eating plan about foods to eat with blood pressure.  I will check a PSA blood test as well as a urine test to look for other causes of nighttime urination, but try to drink fluids in the earlier part of the day and not right before bedtime.  Return to the clinic or go to the nearest emergency room if  any of your symptoms worsen or new symptoms occur.  DASH Eating Plan DASH stands for "Dietary Approaches to Stop Hypertension." The DASH eating plan is a healthy eating plan that has been shown to reduce high blood pressure (hypertension). Additional health benefits may include reducing the risk of type 2 diabetes mellitus, heart disease, and stroke. The DASH eating plan may also help with weight loss. WHAT DO I NEED TO KNOW ABOUT THE DASH EATING PLAN? For the DASH eating plan, you will follow these general guidelines:  Choose foods with a percent daily value for sodium of less than 5% (as listed on the food label).  Use salt-free seasonings or herbs instead of table salt or sea salt.  Check with your health care provider or pharmacist before using salt substitutes.  Eat lower-sodium products, often labeled as "lower sodium" or "no salt added."  Eat fresh foods.  Eat more vegetables, fruits, and low-fat dairy products.  Choose whole grains. Look for the word "whole" as the first word in the ingredient list.  Choose fish and skinless chicken or [place] more often than red meat. Limit fish, poultry, and meat to 6 oz (170 g) each day.  Limit sweets, desserts, sugars, and sugary drinks.  Choose heart-healthy fats.  Limit cheese to 1 oz (28 g) per day.  Eat more home-cooked food and less restaurant, buffet, and fast food.  Limit fried foods.  Cook foods using methods other than frying.  Limit canned vegetables. If you do use them, rinse them well to decrease the sodium.  When eating at a restaurant, ask that your food be prepared with less salt, or no salt if possible. WHAT FOODS CAN I EAT? Seek help from a dietitian for individual calorie needs. Grains Whole grain or whole wheat bread. Brown rice. Whole grain or whole wheat pasta. Quinoa, bulgur, and whole grain cereals. Low-sodium cereals. Corn or whole wheat flour tortillas. Whole grain cornbread. Whole grain crackers.  Low-sodium crackers. Vegetables Fresh or frozen vegetables (raw, steamed, roasted, or grilled). Low-sodium or reduced-sodium tomato and vegetable juices. Low-sodium or reduced-sodium tomato sauce and paste. Low-sodium or reduced-sodium canned vegetables.  Fruits All fresh, canned (in natural juice), or frozen fruits. Meat and Other Protein Products Ground beef (85% or leaner), grass-fed beef, or beef trimmed of fat. Skinless chicken or [place]. Ground chicken or [place]. Pork trimmed of fat. All fish and seafood. Eggs. Dried beans, peas, or lentils. Unsalted nuts and seeds. Unsalted canned beans. Dairy Low-fat dairy products, such as skim or 1% milk, 2% or reduced-fat cheeses, low-fat ricotta or cottage cheese, or plain low-fat yogurt. Low-sodium or reduced-sodium cheeses. Fats and Oils Tub margarines without trans fats. Light or reduced-fat mayonnaise and salad dressings (reduced sodium). Avocado. Safflower, olive, or canola oils. Natural peanut or almond butter. Other Unsalted popcorn and pretzels. The items listed above may not be a complete list of recommended foods or beverages. Contact your dietitian for more options. WHAT FOODS ARE NOT RECOMMENDED? Grains White bread. White pasta. White rice. Refined cornbread. Bagels and croissants. Crackers that contain trans fat. Vegetables Creamed or fried vegetables. Vegetables in a cheese sauce. Regular canned vegetables. Regular canned tomato sauce and paste. Regular tomato and vegetable juices. Fruits Dried fruits. Canned fruit in light or heavy syrup. Fruit juice. Meat and Other Protein Products Fatty cuts of meat. Ribs, chicken wings, bacon, sausage, bologna, salami, chitterlings, fatback, hot dogs, bratwurst, and packaged luncheon meats. Salted nuts and seeds. Canned beans with salt. Dairy Whole or 2% milk, cream, half-and-half, and cream cheese. Whole-fat or sweetened yogurt. Full-fat  cheeses or blue cheese. Nondairy creamers and whipped  toppings. Processed cheese, cheese spreads, or cheese curds. Condiments Onion and garlic salt, seasoned salt, table salt, and sea salt. Canned and packaged gravies. Worcestershire sauce. Tartar sauce. Barbecue sauce. Teriyaki sauce. Soy sauce, including reduced sodium. Steak sauce. Fish sauce. Oyster sauce. Cocktail sauce. Horseradish. Ketchup and mustard. Meat flavorings and tenderizers. Bouillon cubes. Hot sauce. Tabasco sauce. Marinades. Taco seasonings. Relishes. Fats and Oils Butter, stick margarine, lard, shortening, ghee, and bacon fat. Coconut, palm kernel, or palm oils. Regular salad dressings. Other Pickles and olives. Salted popcorn and pretzels. The items listed above may not be a complete list of foods and beverages to avoid. Contact your dietitian for more information. WHERE CAN I FIND MORE INFORMATION? National Heart, Lung, and Blood Institute: travelstabloid.com   This information is not intended to replace advice given to you by your health care provider. Make sure you discuss any questions you have with your health care provider.   Document Released: 06/08/2011 Document Revised: 07/10/2014 Document Reviewed: 04/23/2013 Elsevier Interactive Patient Education 2016 [employer].  Hypertension Hypertension, commonly called high blood pressure, is when the force of blood pumping through your arteries is too strong. Your arteries are the blood vessels that carry blood from your heart throughout your body. A blood pressure reading consists of a higher number over a lower number, such as 110/72. The higher number (systolic) is the pressure inside your arteries when your heart pumps. The lower number (diastolic) is the pressure inside your arteries when your heart relaxes. Ideally you want your blood pressure below 120/80. Hypertension forces your heart to work harder to pump blood. Your arteries may become narrow or stiff. Having untreated or uncontrolled  hypertension can cause heart attack, stroke, kidney disease, and other problems. RISK FACTORS Some risk factors for high blood pressure are controllable. Others are not.  Risk factors you cannot control include:   Race. You may be at higher risk if you are African American.  Age. Risk increases with age.  Gender. Men are at higher risk than women before age 96 years. After age 46, women are at higher risk than men. Risk factors you can control include:  Not getting enough exercise or physical activity.  Being overweight.  Getting too much fat, sugar, calories, or salt in your diet.  Drinking too much alcohol. SIGNS AND SYMPTOMS Hypertension does not usually cause signs or symptoms. Extremely high blood pressure (hypertensive crisis) may cause headache, anxiety, shortness of breath, and nosebleed. DIAGNOSIS To check if you have hypertension, your health care provider will measure your blood pressure while you are seated, with your arm held at the level of your heart. It should be measured at least twice using the same arm. Certain conditions can cause a difference in blood pressure between your right and left arms. A blood pressure reading that is higher than normal on one occasion does not mean that you need treatment. If it is not clear whether you have high blood pressure, you may be asked to return on a different day to have your blood pressure checked again. Or, you may be asked to monitor your blood pressure at home for 1 or more weeks. TREATMENT Treating high blood pressure includes making lifestyle changes and possibly taking medicine. Living a healthy lifestyle can help lower high blood pressure. You may need to change some of your habits. Lifestyle changes may include:  Following the DASH diet. This diet is high in fruits, vegetables, and whole  grains. It is low in salt, red meat, and added sugars.  Keep your sodium intake below 2,300 mg per day.  Getting at least 30-45  minutes of aerobic exercise at least 4 times per week.  Losing weight if necessary.  Not smoking.  Limiting alcoholic beverages.  Learning ways to reduce stress. Your health care provider may prescribe medicine if lifestyle changes are not enough to get your blood pressure under control, and if one of the following is true:  You are 103-3 years of age and your systolic blood pressure is above 140.  You are 53 years of age or older, and your systolic blood pressure is above 150.  Your diastolic blood pressure is above 90.  You have diabetes, and your systolic blood pressure is over 456 or your diastolic blood pressure is over 90.  You have kidney disease and your blood pressure is above 140/90.  You have heart disease and your blood pressure is above 140/90. Your personal target blood pressure may vary depending on your medical conditions, your age, and other factors. HOME CARE INSTRUCTIONS  Have your blood pressure rechecked as directed by your health care provider.   Take medicines only as directed by your health care provider. Follow the directions carefully. Blood pressure medicines must be taken as prescribed. The medicine does not work as well when you skip doses. Skipping doses also puts you at risk for problems.  Do not smoke.   Monitor your blood pressure at home as directed by your health care provider. SEEK MEDICAL CARE IF:   You think you are having a reaction to medicines taken.  You have recurrent headaches or feel dizzy.  You have swelling in your ankles.  You have trouble with your vision. SEEK IMMEDIATE MEDICAL CARE IF:  You develop a severe headache or confusion.  You have unusual weakness, numbness, or feel faint.  You have severe chest or abdominal pain.  You vomit repeatedly.  You have trouble breathing. MAKE SURE YOU:   Understand these instructions.  Will watch your condition.  Will get help right away if you are not doing well or get  worse.   This information is not intended to replace advice given to you by your health care provider. Make sure you discuss any questions you have with your health care provider.   Document Released: 06/19/2005 Document Revised: 11/03/2014 Document Reviewed: 04/11/2013 Elsevier Interactive Patient Education 2016 [employer]  Upper Respiratory Infection, Adult Most upper respiratory infections (URIs) are a viral infection of the air passages leading to the lungs. A URI affects the nose, throat, and upper air passages. The most common type of URI is nasopharyngitis and is typically referred to as "the common cold." URIs run their course and usually go away on their own. Most of the time, a URI does not require medical attention, but sometimes a bacterial infection in the upper airways can follow a viral infection. This is called a secondary infection. Sinus and middle ear infections are common types of secondary upper respiratory infections. Bacterial pneumonia can also complicate a URI. A URI can worsen asthma and chronic obstructive pulmonary disease (COPD). Sometimes, these complications can require emergency medical care and may be life threatening.  CAUSES Almost all URIs are caused by viruses. A virus is a type of germ and can spread from one person to another.  RISKS FACTORS You may be at risk for a URI if:   You smoke.   You have chronic heart or  lung disease.  You have a weakened defense (immune) system.   You are very young or very old.   You have nasal allergies or asthma.  You work in crowded or poorly ventilated areas.  You work in health care facilities or schools. SIGNS AND SYMPTOMS  Symptoms typically develop 2-3 days after you come in contact with a cold virus. Most viral URIs last 7-10 days. However, viral URIs from the influenza virus (flu virus) can last 14-18 days and are typically more severe. Symptoms may include:   Runny or stuffy (congested) nose.    Sneezing.   Cough.   Sore throat.   Headache.   Fatigue.   Fever.   Loss of appetite.   Pain in your forehead, behind your eyes, and over your cheekbones (sinus pain).  Muscle aches.  DIAGNOSIS  Your health care provider may diagnose a URI by:  Physical exam.  Tests to check that your symptoms are not due to another condition such as:  Strep throat.  Sinusitis.  Pneumonia.  Asthma. TREATMENT  A URI goes away on its own with time. It cannot be cured with medicines, but medicines may be prescribed or recommended to relieve symptoms. Medicines may help:  Reduce your fever.  Reduce your cough.  Relieve nasal congestion. HOME CARE INSTRUCTIONS   Take medicines only as directed by your health care provider.   Gargle warm saltwater or take cough drops to comfort your throat as directed by your health care provider.  Use a warm mist humidifier or inhale steam from a shower to increase air moisture. This may make it easier to breathe.  Drink enough fluid to keep your urine clear or pale yellow.   Eat soups and other clear broths and maintain good nutrition.   Rest as needed.   Return to work when your temperature has returned to normal or as your health care provider advises. You may need to stay home longer to avoid infecting others. You can also use a face mask and careful hand washing to prevent spread of the virus.  Increase the usage of your inhaler if you have asthma.   Do not use any tobacco products, including cigarettes, chewing tobacco, or electronic cigarettes. If you need help quitting, ask your health care provider. PREVENTION  The best way to protect yourself from getting a cold is to practice good hygiene.   Avoid oral or hand contact with people with cold symptoms.   Wash your hands often if contact occurs.  There is no clear evidence that vitamin C, vitamin E, echinacea, or exercise reduces the chance of developing a cold.  However, it is always recommended to get plenty of rest, exercise, and practice good nutrition.  SEEK MEDICAL CARE IF:   You are getting worse rather than better.   Your symptoms are not controlled by medicine.   You have chills.  You have worsening shortness of breath.  You have brown or red mucus.  You have yellow or brown nasal discharge.  You have pain in your face, especially when you bend forward.  You have a fever.  You have swollen neck glands.  You have pain while swallowing.  You have white areas in the back of your throat. SEEK IMMEDIATE MEDICAL CARE IF:   You have severe or persistent:  Headache.  Ear pain.  Sinus pain.  Chest pain.  You have chronic lung disease and any of the following:  Wheezing.  Prolonged cough.  Coughing up blood.  A change in your usual mucus.  You have a stiff neck.  You have changes in your:  Vision.  Hearing.  Thinking.  Mood. MAKE SURE YOU:   Understand these instructions.  Will watch your condition.  Will get help right away if you are not doing well or get worse.   This information is not intended to replace advice given to you by your health care provider. Make sure you discuss any questions you have with your health care provider.   Document Released: 12/13/2000 Document Revised: 11/03/2014 Document Reviewed: 09/24/2013 Elsevier Interactive Patient Education 2016 [employer].    IF you received an x-ray today, you will receive an invoice from [facility]. Please contact [facility] at [phone] with questions or concerns regarding your invoice.   IF you received labwork today, you will receive an invoice from [employer]. Please contact [facility] at [phone] with questions or concerns regarding your invoice.   Our billing staff will not be able to assist you with questions regarding bills from these companies.  You will be contacted with the  lab results as soon as they are available. The fastest way to get your results is to activate your My Chart account. Instructions are located on the last page of this paperwork. If you have not heard from [place] regarding the results in 2 weeks, please contact this office.       Signed,   Merri Ray, MD [facility].  03/26/16 10:59 PM

## 2016-04-06 ENCOUNTER — Ambulatory Visit (INDEPENDENT_AMBULATORY_CARE_PROVIDER_SITE_OTHER): Payer: Managed Care, Other (non HMO) | Admitting: Neurology

## 2016-04-06 ENCOUNTER — Encounter: Payer: Self-pay | Admitting: Neurology

## 2016-04-06 VITALS — BP 157/103 | HR 73 | Ht 73.0 in | Wt 307.0 lb

## 2016-04-06 DIAGNOSIS — G43009 Migraine without aura, not intractable, without status migrainosus: Secondary | ICD-10-CM

## 2016-04-06 MED ORDER — RIZATRIPTAN BENZOATE 10 MG PO TBDP: 10.0000 mg | ORAL_TABLET | Freq: Three times a day (TID) | ORAL | 3 refills | Status: DC | PRN

## 2016-04-06 NOTE — Progress Notes (Signed)
Reason for visit: Migraine headache  Referring physician: Dr. Dot Lanes [name] is a 45 y.o. male  History of present illness:  Craig Guzman is a 45 year old right-handed black male with a history of migraine headaches since 1990. The patient has previously been seen by Dr. Earley Favor for treatment. Currently, he is having 2 or 3 headaches a month, the headaches usually are bifrontal in nature, and may spread to the back of the head. The patient may have nausea and vomiting, photophobia and phonophobia when the headaches become severe. The patient denies any neck stiffness. He denies any numbness or weakness of the face, arms, or legs. Occasionally weather changes or allergy related exposure such as grass or mold may bring on a headache. The patient claims that the headache usually is not associated with any visual changes, the headache may last for 5 hours. He takes Flexeril for the headache which seems to be helpful if he can catch it early. He is sent to this office for an evaluation. His mother also has a history of migraine headache.  Past Medical History:  Diagnosis Date  . Allergy    trees/ grasses/ animals/dust/mold  . Anxiety   . Biliary dyskinesia   . Colloid thyroid nodule   . Crohn's disease ([place])    Stomach, terminal ileum, cecum  . Gastric ulcer    antral  . GERD (gastroesophageal reflux disease)   . History of migraine headaches   . HTN (hypertension)   . Kidney stone 09/20/2012  . Migraine   . Obesity   . Pneumonia 02/27/2014   ED    Past Surgical History:  Procedure Laterality Date  . CHOLECYSTECTOMY  2011   [facility]ower  . COLONOSCOPY  07/26/11   Crohn's colitis, ileitis  . ESOPHAGOGASTRODUODENOSCOPY  05/04/11, 07/26/11   granulomatous gastritis - Crohn's  . FLEXIBLE SIGMOIDOSCOPY    . JOINT REPLACEMENT    . MULTIPLE TOOTH EXTRACTIONS  2005  . SHOULDER SURGERY     right  . VASECTOMY     bilateral w/lysis of penile adhesions    Family History  Problem  Relation Age of Onset  . Colon polyps Father   . Diabetes Father   . Hypertension Father   . Hypertension Mother   . Asthma Mother   . Heart disease Maternal Grandmother   . Colon cancer Neg Hx   . Rectal cancer Neg Hx   . Stomach cancer Neg Hx   . Esophageal cancer Neg Hx     Social history:  reports that he has been smoking Cigarettes.  He has a 21.00 pack-year smoking history. He has quit using smokeless tobacco. His smokeless tobacco use included Snuff. He reports that he does not drink alcohol or use drugs.  Medications:  Prior to Admission medications   Medication Sig Start Date End Date Taking? Authorizing Provider  Certolizumab Pegol (CIMZIA PREFILLED) 2 X 200 MG/ML KIT Inject 400 mg into the skin every 30 (thirty) days. Inject 400 mg or 2 pens into skin once a month 04/14/15  Yes Gatha Mayer, MD  cyclobenzaprine (FLEXERIL) 10 MG tablet Take 1 tablet (10 mg total) by mouth 3 (three) times daily as needed. 03/10/16  Yes [name], MD  doxazosin (CARDURA) 1 MG tablet Take 1 tablet (1 mg total) by mouth daily. 03/25/16  Yes [name], MD  lisinopril-hydrochlorothiazide (PRINZIDE,ZESTORETIC) 20-12.5 MG tablet Take 2 tablets by mouth daily. 03/10/16  Yes [name], MD  omeprazole-sodium bicarbonate (  ZEGERID) 40-1100 MG capsule Take 1 capsule by mouth at bedtime. 03/10/16  Yes [name], MD  prochlorperazine (COMPRO) 25 MG suppository Place 1 suppository (25 mg total) rectally every 12 (twelve) hours as needed for nausea or vomiting. 04/07/15  Yes Gatha Mayer, MD  verapamil (CALAN-SR) 180 MG CR tablet TAKE 2 TABLETS (360 MG TOTAL) BY MOUTH DAILY. 03/10/16  Yes [name], MD      Allergies  Allergen Reactions  . Zithromax [Azithromycin] Hypertension    ROS:  Out of a complete 14 system review of symptoms, the patient complains only of the following symptoms, and all other reviewed systems are negative.  Eye pain Headache Allergies Change in  appetite  Blood pressure (!) 157/103, pulse 73, height 6' 1"  (1.854 m), weight (!) 307 lb (139.3 kg).  Physical Exam  General: The patient is alert and cooperative at the time of the examination. The patient is markedly obese.  Eyes: Pupils are equal, round, and reactive to light. Discs are flat bilaterally.  Neck: The neck is supple, no carotid bruits are noted.  Respiratory: The respiratory examination is clear.  Cardiovascular: The cardiovascular examination reveals a regular rate and rhythm, no obvious murmurs or rubs are noted.  Skin: Extremities are without significant edema.  Neurologic Exam  Mental status: The patient is alert and oriented x 3 at the time of the examination. The patient has apparent normal recent and remote memory, with an apparently normal attention span and concentration ability.  Cranial nerves: Facial symmetry is present. There is good sensation of the face to pinprick and soft touch bilaterally. The strength of the facial muscles and the muscles to head turning and shoulder shrug are normal bilaterally. Speech is well enunciated, no aphasia or dysarthria is noted. Extraocular movements are full. Visual fields are full. The tongue is midline, and the patient has symmetric elevation of the soft palate. No obvious hearing deficits are noted.  Motor: The motor testing reveals 5 over 5 strength of all 4 extremities. Good symmetric motor tone is noted throughout.  Sensory: Sensory testing is intact to pinprick, soft touch, vibration sensation, and position sense on all 4 extremities. No evidence of extinction is noted.  Coordination: Cerebellar testing reveals good finger-nose-finger and heel-to-shin bilaterally.  Gait and station: Gait is normal. Tandem gait is normal. Romberg is negative. No drift is seen.  Reflexes: Deep tendon reflexes are symmetric and normal bilaterally. Toes are downgoing bilaterally.   CT head 04/07/11:  IMPRESSION: Normal head  CT.  * CT scan images were reviewed online. I agree with the written report.    Assessment/Plan:  1. Common migraine headache  The patient is commonly having headaches during the weekend when he is not working, representing the "let down period". The patient will be given Maxalt to take if needed for the headache, he can still take the Flexeril. Will follow-up in 6 months, he will contact our office if he is not doing well.  Jill Alexanders MD 04/06/2016 4:06 PM  [facility] [address] [place], [place] [address]  Phone [phone] Fax [phone]

## 2016-04-06 NOTE — Patient Instructions (Addendum)

## 2016-04-27 ENCOUNTER — Institutional Professional Consult (permissible substitution): Payer: Managed Care, Other (non HMO) | Admitting: Neurology

## 2016-05-15 ENCOUNTER — Encounter: Payer: Self-pay | Admitting: Neurology

## 2016-05-15 ENCOUNTER — Ambulatory Visit (INDEPENDENT_AMBULATORY_CARE_PROVIDER_SITE_OTHER): Payer: Managed Care, Other (non HMO) | Admitting: Neurology

## 2016-05-15 VITALS — BP 148/94 | HR 69 | Resp 20 | Ht 73.0 in | Wt 299.0 lb

## 2016-05-15 DIAGNOSIS — F17210 Nicotine dependence, cigarettes, uncomplicated: Secondary | ICD-10-CM

## 2016-05-15 DIAGNOSIS — Z72820 Sleep deprivation: Secondary | ICD-10-CM | POA: Diagnosis not present

## 2016-05-15 DIAGNOSIS — J9691 Respiratory failure, unspecified with hypoxia: Secondary | ICD-10-CM | POA: Diagnosis not present

## 2016-05-15 DIAGNOSIS — E662 Morbid (severe) obesity with alveolar hypoventilation: Secondary | ICD-10-CM

## 2016-05-15 DIAGNOSIS — G4736 Sleep related hypoventilation in conditions classified elsewhere: Secondary | ICD-10-CM | POA: Diagnosis not present

## 2016-05-15 DIAGNOSIS — R51 Headache: Secondary | ICD-10-CM

## 2016-05-15 DIAGNOSIS — J9692 Respiratory failure, unspecified with hypercapnia: Secondary | ICD-10-CM

## 2016-05-15 DIAGNOSIS — R519 Headache, unspecified: Secondary | ICD-10-CM | POA: Insufficient documentation

## 2016-05-15 DIAGNOSIS — G4726 Circadian rhythm sleep disorder, shift work type: Secondary | ICD-10-CM | POA: Insufficient documentation

## 2016-05-15 NOTE — Patient Instructions (Signed)
Please remember to try to maintain good sleep hygiene, which means: Keep a regular sleep and wake schedule, try not to exercise or have a meal within 2 hours of your bedtime, try to keep your bedroom conducive for sleep, that is, cool and dark, without light distractors such as an illuminated alarm clock, and refrain from watching TV right before sleep or in the middle of the night and do not keep the TV or radio on during the night. Also, try not to use or play on electronic devices at bedtime, such as your cell phone, tablet PC or laptop. If you like to read at bedtime on an electronic device, try to dim the background light as much as possible. Do not eat in the middle of the night.   We will request a sleep study.    We will look for leg twitching and snoring or sleep apnea.   For chronic insomnia, you are best followed by a psychiatrist and/or sleep psychologist.   We will call you with the sleep study results and make a follow up appointment if needed.

## 2016-05-15 NOTE — Progress Notes (Signed)
SLEEP MEDICINE CLINIC   Provider:  Larey Seat, M D  Referring Provider: [name], MD Primary Care Physician:  [name], MD  Chief Complaint  Patient presents with  . New Patient (Initial Visit)    snores had night, had normal sleep study years ago    HPI:  Craig Guzman is a 45 y.o. male , seen here as a referral from [facility] for a sleep consultation,  My partner, Dr. Floyde Parkins, has seen Mr. Dech for migraines but also found that he has a lot of risk factors for obstructive sleep apnea and snoring. Mr. Kornegay endorsed item pain, fatigue, snoring, allergic rhinitis, daytime sleepiness and the feeling of not having enough energy and daytime and this interest in activities at once give him pleasure. He never feels he has enough sleep. He has a past medical history of Crohn's disease, GERD, hiatal hernia, colonic ulcers, migraine headaches and underwent a cholecystectomy in 2011.   Sleep habits are as follows: Because of his Crohn's disease he is forced to eat at night and cannot tolerate food while at work. He has reduced his intake to once a day. 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 He also reaches home between 3 and 4:00 but only stays asleep for about 2 hours after that, unable to go back to sleep until 1 AM. He has to rise to go to work at 3:30 AM. This allows for only for the half hours of sleep in a 24-hour period. His wife confirms that this is his typical amount of sleep. She has witnessed him to snore and have pauses in his breathing at night. He also coughs in his sleep a lot. He will go to the bathroom about hourly interrupting his sleep further. About 9 or 10 years ago he underwent a sleep study that returned negative for sleep apnea as he reports.   About a year ago he shifted from night shifts too early shifts and he feels that this has been better for him. He had been a night shift worker for 15  years Social history:  Married, total of 4 children and a dog. He is a 20 year smoker, smokes about 1 pack per day. He does not drink any alcohol, he drinks about 3 caffeinated sodas per day.    Dr Jannifer Franklin notes_ Reason for visit: Migraine headache.Referring physician: Dr. Carlota Raspberry Mr. Kryder is a 45 year old right-handed black male with a history of migraine headaches since 1990. The patient has previously been seen by Dr. Earley Favor for treatment. Currently, he is having 2 or 3 headaches a month, the headaches usually are bifrontal in nature, and may spread to the back of the head. The patient may have nausea and vomiting, photophobia and phonophobia when the headaches become severe. The patient denies any neck stiffness. He denies any numbness or weakness of the face, arms, or legs. Occasionally weather changes or allergy related exposure such as grass or mold may bring on a headache. The patient claims that the headache usually is not associated with any visual changes, the headache may last for 5 hours. He takes Flexeril for the headache which seems to be helpful if he can catch it early. He is sent to this office for an evaluation. His mother also has a history of migraine headache.    Review of Systems: Out of a complete 14 system review, the patient complains of only the following  symptoms, and all other reviewed systems are negative. See above  Epworth score 10, Fatigue severity score 51   , depression score 5/15    Social History   Social History  . Marital status: Married    Spouse name: N/A  . Number of children: 3  . Years of education: Some college   Occupational History  . [employer]    Social History Main Topics  . Smoking status: Current Every Day Smoker    Packs/day: 1.00    Years: 21.00    Types: Cigarettes  . Smokeless tobacco: Former Systems developer    Types: Snuff     Comment: Counseling sheet given in exam room   . Alcohol use No  . Drug use: No  . Sexual activity: Yes    Other Topics Concern  . Not on file   Social History Narrative   Married. Education: [employer].    Lives at home w/ his wife and kids   Right-handed   Caffeine: 3 sodas per day    Family History  Problem Relation Age of Onset  . Colon polyps Father   . Diabetes Father   . Hypertension Father   . Hypertension Mother   . Asthma Mother   . Heart disease Maternal Grandmother   . Colon cancer Neg Hx   . Rectal cancer Neg Hx   . Stomach cancer Neg Hx   . Esophageal cancer Neg Hx     Past Medical History:  Diagnosis Date  . Allergy    trees/ grasses/ animals/dust/mold  . Anxiety   . Biliary dyskinesia   . Colloid thyroid nodule   . Crohn's disease ([place])    Stomach, terminal ileum, cecum  . Gastric ulcer    antral  . GERD (gastroesophageal reflux disease)   . History of migraine headaches   . HTN (hypertension)   . Kidney stone 09/20/2012  . Migraine   . Obesity   . Pneumonia 02/27/2014   ED    Past Surgical History:  Procedure Laterality Date  . CHOLECYSTECTOMY  2011   [facility]ower  . COLONOSCOPY  07/26/11   Crohn's colitis, ileitis  . ESOPHAGOGASTRODUODENOSCOPY  05/04/11, 07/26/11   granulomatous gastritis - Crohn's  . FLEXIBLE SIGMOIDOSCOPY    . JOINT REPLACEMENT    . MULTIPLE TOOTH EXTRACTIONS  2005  . SHOULDER SURGERY     right  . VASECTOMY     bilateral w/lysis of penile adhesions    Current Outpatient Prescriptions  Medication Sig Dispense Refill  . Certolizumab Pegol (CIMZIA PREFILLED) 2 X 200 MG/ML KIT Inject 400 mg into the skin every 30 (thirty) days. Inject 400 mg or 2 pens into skin once a month 6 each 3  . cyclobenzaprine (FLEXERIL) 10 MG tablet Take 1 tablet (10 mg total) by mouth 3 (three) times daily as needed. 30 tablet 3  . doxazosin (CARDURA) 1 MG tablet Take 1 tablet (1 mg total) by mouth daily. 30 tablet 1  . lisinopril-hydrochlorothiazide (PRINZIDE,ZESTORETIC) 20-12.5 MG tablet Take 2 tablets by mouth daily. 180 tablet 1  .  omeprazole-sodium bicarbonate (ZEGERID) 40-1100 MG capsule Take 1 capsule by mouth at bedtime. 90 capsule 1  . prochlorperazine (COMPRO) 25 MG suppository Place 1 suppository (25 mg total) rectally every 12 (twelve) hours as needed for nausea or vomiting. 12 suppository 1  . rizatriptan (MAXALT-MLT) 10 MG disintegrating tablet Take 1 tablet (10 mg total) by mouth 3 (three) times daily as needed for migraine. 9 tablet 3  . verapamil (  CALAN-SR) 180 MG CR tablet TAKE 2 TABLETS (360 MG TOTAL) BY MOUTH DAILY. 180 tablet 1   No current facility-administered medications for this visit.     Allergies as of 05/15/2016 - Review Complete 05/15/2016  Allergen Reaction Noted  . Zithromax [azithromycin] Hypertension 04/14/2011    Vitals: BP (!) 148/94   Pulse 69   Resp 20   Ht _0  (1.854 m)   Wt 299 lb (135.6 kg)   BMI 39.45 kg/m  Last Weight:  Wt Readings from Last 1 Encounters:  05/15/16 299 lb (135.6 kg)   DPO:EUMP mass index is 39.45 kg/m.     Last Height:   Ht Readings from Last 1 Encounters:  05/15/16 _1  (1.854 m)    Physical exam:  General: The patient is awake, alert and appears not in acute distress. The patient is well groomed. Head: Normocephalic, atraumatic. Neck is supple. Mallampati 5  neck circumference:17.75 . Nasal airflow restricted , Cardiovascular:  Regular rate and rhythm, without  murmurs or carotid bruit, and without distended neck veins. Respiratory: Lungs are clear to auscultation. Skin:  Without evidence of edema, or rash Trunk: BMI is elevated .  Neurologic exam : The patient is awake and alert, oriented to place and time.   Attention span & concentration ability appears normal.  Speech is fluent,  without   dysarthria, dysphonia or aphasia.  Mood and affect are appropriate.  Cranial nerves: Pupils are equal and briskly reactive to light.  Extraocular movements  in vertical and horizontal planes intact and without nystagmus. Visual fields by finger  perimetry are intact. Hearing to finger rub intact. Facial sensation intact to fine touch.Facial motor strength is symmetric and tongue and uvula move midline. Shoulder shrug was symmetrical.  Motor exam: Normal tone, muscle bulk and symmetric strength in all extremities. Sensory:  Fine touch, pinprick and vibration were tested in all extremities. Proprioception tested in the upper extremities was normal. Coordination: Rapid alternating movements in the fingers/hands was normal. Finger-to-nose maneuver  normal without evidence of ataxia, dysmetria or tremor. Gait and station: Patient walks without assistive device and is able unassisted to climb up to the exam table. Strength within normal limits.  Stance is stable and normal.  Deep tendon reflexes: in the  upper and lower extremities are symmetric and intact.   The patient was advised of the nature of the diagnosed sleep disorder , the treatment options and risks for general a health and wellness arising from not treating the condition.  I spent more than 40 minutes of face to face time with the patient. Greater than 50% of time was spent in counseling and coordination of care. We have discussed the diagnosis and differential and I answered the patient's questions.     Assessment:  After physical and neurologic examination, review of laboratory studies,  Personal review of imaging studies, reports of other /same  Imaging studies ,  Results of polysomnography/ neurophysiology testing and pre-existing records as far as provided in visit., my assessment is   1) Mr. Feighner has multiple risk factors for obstructive sleep apnea including his elevated body mass index partially attributed to steroid therapy. Larger than average neck circumference, high-grade Mallampati. Allergic rhinitis falls asleep and to breathe through the mouth. We were evaluating him for OSA and attended sleep study with possible split.  2) the underlying condition of Crohn's disease  as an outer immune disorder can contribute to her high degree of daytime fatigue. Inflammatory markers usually promote muscle ache  soreness fatigue the feeling of weakness or heaviness.  3) he is definitely sleep deprived partially by his work schedule, all bite he reports this is an improvement over his night shift sleep habits until a year ago.  Plan:  Treatment plan and additional workup :  Sleep hygiene has to improve partially by promoting smoking cessation, allowing him to sleep in a dark cool and very quiet room if necessary with earplugs or eyeshades. Melatonin may help to improve the overall sleep duration but I would not be reluctant to prescribe Ambien if needed. He has to be aware that after he takes a sleep aid such as Ambien is not allowed to drive or operate machinery for 7-8 hours. Any accident would be considered is happening under the influence. My main goal is however to evaluate him for sleep apnea and to treat apnea is found. For this reason I will invite him for a split night polysomnography.     Asencion Partridge Jaymari Cromie MD  05/15/2016   CC: [name], [address] [place], [place] [address]

## 2016-05-21 ENCOUNTER — Other Ambulatory Visit: Payer: Self-pay | Admitting: Internal Medicine

## 2016-05-21 DIAGNOSIS — K3184 Gastroparesis: Secondary | ICD-10-CM

## 2016-05-22 ENCOUNTER — Telehealth: Payer: Self-pay | Admitting: Neurology

## 2016-05-22 DIAGNOSIS — G4733 Obstructive sleep apnea (adult) (pediatric): Secondary | ICD-10-CM

## 2016-05-22 NOTE — Addendum Note (Signed)
Addended by: Larey Seat on: 05/22/2016 05:11 PM   Modules accepted: Orders

## 2016-05-22 NOTE — Telephone Encounter (Signed)
Dr Carlean Purl sent in the refills.

## 2016-05-22 NOTE — Telephone Encounter (Signed)
[employer] denied Split, suggested HST.  Can I get an order for HST?

## 2016-05-22 NOTE — Telephone Encounter (Signed)
Ok to refill Sir?

## 2016-05-23 ENCOUNTER — Encounter: Payer: Self-pay | Admitting: Internal Medicine

## 2016-05-23 ENCOUNTER — Other Ambulatory Visit: Payer: Managed Care, Other (non HMO)

## 2016-05-23 ENCOUNTER — Ambulatory Visit (INDEPENDENT_AMBULATORY_CARE_PROVIDER_SITE_OTHER): Payer: Managed Care, Other (non HMO) | Admitting: Internal Medicine

## 2016-05-23 VITALS — BP 170/100 | HR 84 | Ht 73.0 in | Wt 303.2 lb

## 2016-05-23 DIAGNOSIS — Z79899 Other long term (current) drug therapy: Secondary | ICD-10-CM

## 2016-05-23 DIAGNOSIS — K5090 Crohn's disease, unspecified, without complications: Secondary | ICD-10-CM

## 2016-05-23 DIAGNOSIS — Z7960 Long term (current) use of unspecified immunomodulators and immunosuppressants: Secondary | ICD-10-CM

## 2016-05-23 NOTE — Assessment & Plan Note (Signed)
Quantiferon today

## 2016-05-23 NOTE — Assessment & Plan Note (Signed)
Improved

## 2016-05-23 NOTE — Patient Instructions (Signed)
   Glad your doing well.   Your physician has requested that you go to the basement for the lab work before leaving today.   Follow up with Dr Carlean Purl in 6 months.     I appreciate the opportunity to care for you. Silvano Rusk, MD, [facility]

## 2016-05-23 NOTE — Progress Notes (Signed)
   Craig Guzman 45 y.o. 07/01/1971 [id number]  Assessment & Plan:   Encounter Diagnoses  Name Primary?  . Crohn's disease involving stomach ([place]) Yes  . Long-term use of immunosuppressant medication- Cimzia    Overall he seems to be doing reasonably well considering what is been through. His blood pressure is up today from I think a headache. He will take his medicine for that when he gets home. QuantiFERON Gold tests today. Continue symptoms he appeared  Return in 6 months.  I appreciate the opportunity to care for this patient. CC: [name], MD   Subjective:   Chief Complaint: Follow-up Crohn's disease, nausea and vomiting  HPI Craig Guzman is here today, overall he has been better in the past year. He is been able to keep his job and rarely misses work. He says he is still taking the symptoms he had injections. Lately he's had some nausea and vomiting, he's also been passing a kidney stone. He has had recurrent kidney stones. Migraine treatment with Maxalt helps his headaches. He is about to have a sleep study cousin is not resting well, question sleep apnea. He does have a lot of nocturia and stays up. If he gets hot he gets nauseous in the morning and will vomit. He is eating some during the day, in the past he would avoid that. No lower GI symptoms. I recently refilled some Compazine suppositories.  Medications, allergies, past medical history, past surgical history, family history and social history are reviewed and updated in the EMR.  Review of Systems As above  Objective:   Physical Exam @BP  (!) 170/100   Pulse 84   Ht 6' 1"  (1.854 m)   Wt (!) 303 lb 3.2 oz (137.5 kg)   BMI 40.00 kg/m @  General:  NAD Eyes:   anicteric Lungs:  clear Heart::  S1S2 no rubs, murmurs or gallops Abdomen:  soft and nontender, BS+ no succussion splash Ext:   no edema, cyanosis or clubbing    Data Reviewed:   Wt Readings from Last 3 Encounters:  05/23/16 (!) 303 lb 3.2 oz  (137.5 kg)  05/15/16 299 lb (135.6 kg)  04/06/16 (!) 307 lb (139.3 kg)

## 2016-05-27 LAB — QUANTIFERON TB GOLD ASSAY (BLOOD)
Interferon Gamma Release Assay: NEGATIVE
Quantiferon Nil Value: 0.04 IU/mL
Quantiferon Tb Ag Minus Nil Value: 0.01 IU/mL

## 2016-05-28 ENCOUNTER — Emergency Department (HOSPITAL_COMMUNITY)
Admission: EM | Admit: 2016-05-28 | Discharge: 2016-05-28 | Disposition: A | Discharge status: Home / Self Care | Payer: Managed Care, Other (non HMO) | Attending: Emergency Medicine | Admitting: Emergency Medicine

## 2016-05-28 ENCOUNTER — Encounter (HOSPITAL_COMMUNITY): Payer: Self-pay

## 2016-05-28 ENCOUNTER — Emergency Department (HOSPITAL_COMMUNITY): Payer: Managed Care, Other (non HMO)

## 2016-05-28 DIAGNOSIS — I10 Essential (primary) hypertension: Secondary | ICD-10-CM | POA: Insufficient documentation

## 2016-05-28 DIAGNOSIS — R109 Unspecified abdominal pain: Secondary | ICD-10-CM | POA: Insufficient documentation

## 2016-05-28 DIAGNOSIS — F17210 Nicotine dependence, cigarettes, uncomplicated: Secondary | ICD-10-CM | POA: Insufficient documentation

## 2016-05-28 DIAGNOSIS — Z79899 Other long term (current) drug therapy: Secondary | ICD-10-CM | POA: Diagnosis not present

## 2016-05-28 LAB — BASIC METABOLIC PANEL
Anion gap: 8 (ref 5–15)
BUN: 14 mg/dL (ref 6–20)
CO2: 25 mmol/L (ref 22–32)
CREATININE: 1.22 mg/dL (ref 0.61–1.24)
Calcium: 8.9 mg/dL (ref 8.9–10.3)
Chloride: 104 mmol/L (ref 101–111)
GFR calc Af Amer: >60 mL/min (ref >60)
Glucose, Bld: 97 mg/dL (ref 65–99)
POTASSIUM: 3.6 mmol/L (ref 3.5–5.1)
SODIUM: 137 mmol/L (ref 135–145)

## 2016-05-28 LAB — CBC WITH DIFFERENTIAL/PLATELET
BASOS PCT: 0 %
Basophils Absolute: 0 10*3/uL (ref 0.0–0.1)
EOS ABS: 0.3 10*3/uL (ref 0.0–0.7)
EOS PCT: 5 %
HCT: 45.9 % (ref 39.0–52.0)
Hemoglobin: 15.7 g/dL (ref 13.0–17.0)
LYMPHS ABS: 3.2 10*3/uL (ref 0.7–4.0)
Lymphocytes Relative: 50 %
MCH: 28.8 pg (ref 26.0–34.0)
MCHC: 34.2 g/dL (ref 30.0–36.0)
MCV: 84.1 fL (ref 78.0–100.0)
Monocytes Absolute: 0.7 10*3/uL (ref 0.1–1.0)
Monocytes Relative: 11 %
Neutro Abs: 2.2 10*3/uL (ref 1.7–7.7)
Neutrophils Relative %: 34 %
PLATELETS: 176 10*3/uL (ref 150–400)
RBC: 5.46 MIL/uL (ref 4.22–5.81)
RDW: 14.1 % (ref 11.5–15.5)
WBC: 6.4 10*3/uL (ref 4.0–10.5)

## 2016-05-28 LAB — URINALYSIS, ROUTINE W REFLEX MICROSCOPIC
BILIRUBIN URINE: NEGATIVE
Glucose, UA: NEGATIVE mg/dL
HGB URINE DIPSTICK: NEGATIVE
KETONES UR: NEGATIVE mg/dL
Leukocytes, UA: NEGATIVE
Nitrite: NEGATIVE
PROTEIN: NEGATIVE mg/dL
SPECIFIC GRAVITY, URINE: 1.019 (ref 1.005–1.030)
pH: 7.5 (ref 5.0–8.0)

## 2016-05-28 MED ORDER — KETOROLAC TROMETHAMINE 30 MG/ML IJ SOLN
30.0000 mg | Freq: Once | INTRAMUSCULAR | Status: AC
  Administered 2016-05-28: 30 mg via INTRAVENOUS
  Filled 2016-05-28: qty 1

## 2016-05-28 MED ORDER — ONDANSETRON HCL 4 MG/2ML IJ SOLN
4.0000 mg | Freq: Once | INTRAMUSCULAR | Status: AC
  Administered 2016-05-28: 4 mg via INTRAVENOUS
  Filled 2016-05-28: qty 2

## 2016-05-28 MED ORDER — HYDROCODONE-ACETAMINOPHEN 5-325 MG PO TABS: 1.0000 | ORAL_TABLET | Freq: Four times a day (QID) | ORAL | 0 refills | Status: DC | PRN

## 2016-05-28 NOTE — Progress Notes (Signed)
TB test is negative

## 2016-05-28 NOTE — ED Triage Notes (Signed)
Pt here with rt flank pain. Passed 2 kidney stones last week.  Pain has come back and worsened.  Denies new injury.

## 2016-05-28 NOTE — ED Provider Notes (Signed)
[place] DEPT Provider Note   CSN: [id number] Arrival date & time: 05/28/16  1733     History   Chief Complaint Chief Complaint  Patient presents with  . Flank Pain    HPI Kaleth Koy is a 45 y.o. male.  Patient presents emergency department with chief complaint of right flank pain. He reports history of kidney stones. He thinks that he is passing another. He denies any pain with urination, but believes that he did see 2 kidney stones passed last week. He denies any fevers or chills. He states that he felt nauseated and had some vomiting earlier in the week but none today. He has not taken anything for his symptoms. He states that he has had approximately 16 kidney stones in his life. He denies ever needing surgery to have stone removed. He is not currently followed by a urologist.   The history is provided by the patient. No language interpreter was used.    Past Medical History:  Diagnosis Date  . Allergy    trees/ grasses/ animals/dust/mold  . Anxiety   . Biliary dyskinesia   . Colloid thyroid nodule   . Crohn's disease ([place])    Stomach, terminal ileum, cecum  . Gastric ulcer    antral  . GERD (gastroesophageal reflux disease)   . History of migraine headaches   . HTN (hypertension)   . Kidney stone 09/20/2012  . Migraine   . Obesity   . Pneumonia 02/27/2014   ED    Patient Active Problem List   Diagnosis Date Noted  . Cigarette nicotine dependence without complication 69/62/9528  . Shifting sleep-work schedule, affecting sleep 05/15/2016  . Sleep deprivation 05/15/2016  . Sleep related hypoventilation in conditions classified elsewhere 05/15/2016  . Sleep related headaches 05/15/2016  . Morbid obesity ([place]) 09/19/2013  . Diarrhea 07/10/2013  . 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 Hypertension 11/26/2012  . Gastroparesis??? 12/08/2011  . Common migraine without aura 12/08/2011  . Long-term use of immunosuppressant medication- Cimzia 08/08/2011  .  Crohn's disease of stomach and colon-suspected 05/17/2011  . GERD (gastroesophageal reflux disease) 04/21/2011    Past Surgical History:  Procedure Laterality Date  . CHOLECYSTECTOMY  2011   [place]  . COLONOSCOPY  07/26/11   Crohn's colitis, ileitis  . ESOPHAGOGASTRODUODENOSCOPY  05/04/11, 07/26/11   granulomatous gastritis - Crohn's  . FLEXIBLE SIGMOIDOSCOPY    . JOINT REPLACEMENT    . MULTIPLE TOOTH EXTRACTIONS  2005  . SHOULDER SURGERY     right  . VASECTOMY     bilateral w/lysis of penile adhesions       Home Medications    Prior to Admission medications   Medication Sig Start Date End Date Taking? Authorizing Provider  Certolizumab Pegol (CIMZIA PREFILLED) 2 X 200 MG/ML KIT Inject 400 mg into the skin every 30 (thirty) days. Inject 400 mg or 2 pens into skin once a month 04/14/15   Gatha Mayer, MD  COMPRO 25 MG suppository INSERT 1 SUPPOSITORY RECTALLY EVERY 12 HOURS AS NEEDED FOR NAUSEA AND VOMITING 05/22/16   Gatha Mayer, MD  cyclobenzaprine (FLEXERIL) 10 MG tablet Take 1 tablet (10 mg total) by mouth 3 (three) times daily as needed. 03/10/16   [name], MD  doxazosin (CARDURA) 1 MG tablet Take 1 tablet (1 mg total) by mouth daily. 03/25/16   [name], MD  lisinopril-hydrochlorothiazide (PRINZIDE,ZESTORETIC) 20-12.5 MG tablet Take 2 tablets by mouth daily. 03/10/16   [name], MD  omeprazole-sodium bicarbonate (ZEGERID) 40-1100 MG capsule Take 1 capsule by mouth at bedtime. 03/10/16   [name], MD  rizatriptan (MAXALT-MLT) 10 MG disintegrating tablet Take 1 tablet (10 mg total) by mouth 3 (three) times daily as needed for migraine. 04/06/16   Kathrynn Ducking, MD  verapamil (CALAN-SR) 180 MG CR tablet TAKE 2 TABLETS (360 MG TOTAL) BY MOUTH DAILY. 03/10/16   [name], MD    Family History Family History  Problem Relation Age of Onset  . Colon polyps Father   . Diabetes Father   . Hypertension Father   . Hypertension Mother   .  Asthma Mother   . Heart disease Maternal Grandmother   . Colon cancer Neg Hx   . Rectal cancer Neg Hx   . Stomach cancer Neg Hx   . Esophageal cancer Neg Hx     Social History Social History  Substance Use Topics  . Smoking status: Current Every Day Smoker    Packs/day: 1.00    Years: 21.00    Types: Cigarettes  . Smokeless tobacco: Former Systems developer    Types: Snuff     Comment: Counseling sheet given in exam room   . Alcohol use No     Allergies   Zithromax [azithromycin]   Review of Systems Review of Systems  All other systems reviewed and are negative.    Physical Exam Updated Vital Signs BP (!) 187/116 (BP Location: Right Arm)   Pulse 77   Temp 98.1 F (36.7 C) (Oral)   Resp 18   SpO2 98%   Physical Exam  Constitutional: He is oriented to person, place, and time. He appears well-developed and well-nourished.  HENT:  Head: Normocephalic and atraumatic.  Eyes: Conjunctivae and EOM are normal. Pupils are equal, round, and reactive to light. Right eye exhibits no discharge. Left eye exhibits no discharge. No scleral icterus.  Neck: Normal range of motion. Neck supple. No JVD present.  Cardiovascular: Normal rate, regular rhythm and normal heart sounds.  Exam reveals no gallop and no friction rub.   No murmur heard. Pulmonary/Chest: Effort normal and breath sounds normal. No respiratory distress. He has no wheezes. He has no rales. He exhibits no tenderness.  Abdominal: Soft. He exhibits no distension and no mass. There is no tenderness. There is no rebound and no guarding.  Musculoskeletal: Normal range of motion. He exhibits no edema or tenderness.  Neurological: He is alert and oriented to person, place, and time.  Skin: Skin is warm and dry.  Psychiatric: He has a normal mood and affect. His behavior is normal. Judgment and thought content normal.  Nursing note and vitals reviewed.    ED Treatments / Results  Labs (all labs ordered are listed, but only  abnormal results are displayed) Labs Reviewed  URINALYSIS, ROUTINE W REFLEX MICROSCOPIC (NOT AT [facility])  CBC WITH DIFFERENTIAL/PLATELET  BASIC METABOLIC PANEL    EKG  EKG Interpretation None       Radiology No results found.  Procedures Procedures (including critical care time)  Medications Ordered in ED Medications  ketorolac (TORADOL) 30 MG/ML injection 30 mg (not administered)  ondansetron (ZOFRAN) injection 4 mg (not administered)     Initial Impression / Assessment and Plan / ED Course  I have reviewed the triage vital signs and the nursing notes.  Pertinent labs & imaging results that were available during my care of the patient were reviewed by me and considered in my medical decision making (see chart for  details).  Clinical Course     Patient with right flank pain. History of kidney stones. Physical exam appearance consistent with kidney stone. Patient inquires about getting a CT scan because the stone is not seen to be passing. Feel this is appropriate. We'll treat patient's pain and will reassess.  CT is negative for kidney stone. Also normal-appearing appendix. Prostate is enlarged. I discussed with patient. I have a very low suspicion for prostatitis. Normal blood counts. Patient is afebrile. Vital signs are stable. Patient feels much better after the Toradol. Question whether he recently passed a stone and just had some residual ureteral spasm.  Will recommend urology follow-up. Patient understands and agrees the plan. He is stable and ready for discharge.  Final Clinical Impressions(s) / ED Diagnoses   Final diagnoses:  Flank pain    New Prescriptions New Prescriptions   HYDROCODONE-ACETAMINOPHEN (NORCO/VICODIN) 5-325 MG TABLET    Take 1-2 tablets by mouth every 6 (six) hours as needed.     Montine Circle, PA-C 05/28/16 [place], MD 05/29/16 [phone]

## 2016-06-05 ENCOUNTER — Ambulatory Visit (INDEPENDENT_AMBULATORY_CARE_PROVIDER_SITE_OTHER): Payer: Managed Care, Other (non HMO) | Admitting: Neurology

## 2016-06-05 ENCOUNTER — Telehealth: Payer: Self-pay | Admitting: Internal Medicine

## 2016-06-05 DIAGNOSIS — G4733 Obstructive sleep apnea (adult) (pediatric): Secondary | ICD-10-CM

## 2016-06-05 NOTE — Telephone Encounter (Signed)
Do you want to consider Humira?  I thought we had him on Humira a few years ago?

## 2016-06-05 NOTE — Telephone Encounter (Signed)
We should try Humira

## 2016-06-06 MED ORDER — ADALIMUMAB 40 MG/0.8ML ~~LOC~~ AJKT: 40.0000 mg | AUTO-INJECTOR | SUBCUTANEOUS | 6 refills | Status: DC

## 2016-06-06 MED ORDER — ADALIMUMAB 40 MG/0.8ML ~~LOC~~ AJKT: 160.0000 mg | AUTO-INJECTOR | SUBCUTANEOUS | Status: DC

## 2016-06-06 NOTE — Telephone Encounter (Signed)
Patient's wife notified that we will switch to Humira.  Rx discount card placed out front.  She is advised that we should hear in the next week or so about approval.

## 2016-06-12 NOTE — Procedures (Signed)
[facility] @[facility] [address], [place],  [address] NAME: Craig Guzman, Craig Guzman DOB: Apr 10, 1971 MEDICAL RECORD [id number] DOS:  06-05-16 REFERRING PHYSICIAN: Merri Ray, MD  STUDY PERFORMED:  HST/ Out of Center Sleep Test  HISTORY: This patient is a 45 y.o male with a medical history of superobesity ( BMI 40) Crohn's disease, colon ulcers, GERD, hiatal hernia, and  migraine headaches.  His sleep concerns are snoring, excessive daytime sleepiness, and feeling like he does not have any energy. 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 Mallampati grade 5, neck  17.75".  Epworth Sleepiness score endorsed at 10,  the Fatigue Severity score at 51 points. Medication listed in EPIC.   STUDY RESULTS:  Total Recording Time:  9h 1mTotal Apnea/Hypopnea Index (AHI) 7.9 Average Oxygen Desaturation: SPO2 94 Lowest Oxygen desaturation: 82%  IMPRESSION: Mild sleep apnea with a AHI of 7.9 /hr. of recording. RDI 11.3. Snoring is likely present.  No significant hypoxemia. Tachy- brady arrhythmia, between 43 and 121 bpm.   RECOMMENDATION: Given the mild degree of apnea without hypoxemia, I recommend to treat either with a dental device or CPAP.  The patient has the choice.  In addition, weight loss is strongly recommended. I certify that I have reviewed the raw data recording prior to the issuance of this report in accordance with the standards of Accreditation of the American Academy of Sleep medicine (AASM)  CLarey Seat MD   06-05-2016  Diplomat, American Board of Psychiatry and Neurology  Diplomat, American Board of SHardeeDirector, [employer]

## 2016-06-12 NOTE — Telephone Encounter (Signed)
Patient notified that Humira has been approved.

## 2016-06-13 ENCOUNTER — Telehealth: Payer: Self-pay

## 2016-06-13 DIAGNOSIS — G4733 Obstructive sleep apnea (adult) (pediatric): Secondary | ICD-10-CM

## 2016-06-13 NOTE — Telephone Encounter (Signed)
-----   Message from Larey Seat, MD sent at 06/12/2016  4:49 PM EST ----- Mild apnea , but cardiac rhythm abnormality indicates some stress associated with OSA. Rec CPAP auto or dental device. Patients preference.  Weight loss is encouraged.

## 2016-06-13 NOTE — Telephone Encounter (Signed)
LM for patient to call back for results of sleep study.

## 2016-06-13 NOTE — Telephone Encounter (Signed)
Patient called back. I was able to give results and recommendations. Patient would like to start PAP therapy. I will send order to DME company. I will send report to PCP. Patient will get a letter reminding them to make f/u appt and stress the importance of compliance.

## 2016-06-14 ENCOUNTER — Other Ambulatory Visit: Payer: Self-pay

## 2016-06-14 DIAGNOSIS — R351 Nocturia: Secondary | ICD-10-CM

## 2016-06-14 DIAGNOSIS — I10 Essential (primary) hypertension: Secondary | ICD-10-CM

## 2016-06-14 MED ORDER — DOXAZOSIN MESYLATE 1 MG PO TABS: 1.0000 mg | ORAL_TABLET | Freq: Every day | ORAL | 0 refills | Status: DC

## 2016-06-14 NOTE — Telephone Encounter (Signed)
Patient would like to start PAP treatment, please place order.

## 2016-06-14 NOTE — Addendum Note (Signed)
Addended by: Larey Seat on: 06/14/2016 12:02 PM   Modules accepted: Orders

## 2016-06-14 NOTE — Telephone Encounter (Signed)
Fax req [facility] - seen 03/2016 & asked to return in 2 weeks. No OV since.   Please advise.

## 2016-06-14 NOTE — Telephone Encounter (Signed)
30 days prescribed, needs office visit for further refills. Please call let him know this plan as last visit in September planned to follow-up within a few weeks.

## 2016-06-14 NOTE — Telephone Encounter (Signed)
IC & spoke with wife re: pt needs appt asap prior to running out of meds per Dr. Vonna Kotyk msg.

## 2016-06-23 ENCOUNTER — Inpatient Hospital Stay (HOSPITAL_COMMUNITY)
Admission: EM | Admit: 2016-06-23 | Discharge: 2016-06-26 | DRG: 439 | Disposition: A | Discharge status: Home / Self Care | Payer: Managed Care, Other (non HMO) | Attending: Internal Medicine | Admitting: Internal Medicine

## 2016-06-23 DIAGNOSIS — Z23 Encounter for immunization: Secondary | ICD-10-CM

## 2016-06-23 DIAGNOSIS — K853 Drug induced acute pancreatitis without necrosis or infection: Principal | ICD-10-CM | POA: Diagnosis present

## 2016-06-23 DIAGNOSIS — Z833 Family history of diabetes mellitus: Secondary | ICD-10-CM

## 2016-06-23 DIAGNOSIS — K5000 Crohn's disease of small intestine without complications: Secondary | ICD-10-CM | POA: Diagnosis present

## 2016-06-23 DIAGNOSIS — Z6839 Body mass index (BMI) 39.0-39.9, adult: Secondary | ICD-10-CM

## 2016-06-23 DIAGNOSIS — K5010 Crohn's disease of large intestine without complications: Secondary | ICD-10-CM | POA: Diagnosis present

## 2016-06-23 DIAGNOSIS — T502X5A Adverse effect of carbonic-anhydrase inhibitors, benzothiadiazides and other diuretics, initial encounter: Secondary | ICD-10-CM | POA: Diagnosis present

## 2016-06-23 DIAGNOSIS — Z825 Family history of asthma and other chronic lower respiratory diseases: Secondary | ICD-10-CM

## 2016-06-23 DIAGNOSIS — K859 Acute pancreatitis without necrosis or infection, unspecified: Secondary | ICD-10-CM

## 2016-06-23 DIAGNOSIS — Z8249 Family history of ischemic heart disease and other diseases of the circulatory system: Secondary | ICD-10-CM

## 2016-06-23 DIAGNOSIS — F17210 Nicotine dependence, cigarettes, uncomplicated: Secondary | ICD-10-CM | POA: Diagnosis present

## 2016-06-23 DIAGNOSIS — E669 Obesity, unspecified: Secondary | ICD-10-CM | POA: Diagnosis present

## 2016-06-23 DIAGNOSIS — Y92009 Unspecified place in unspecified non-institutional (private) residence as the place of occurrence of the external cause: Secondary | ICD-10-CM

## 2016-06-23 DIAGNOSIS — Z79899 Other long term (current) drug therapy: Secondary | ICD-10-CM

## 2016-06-23 DIAGNOSIS — E876 Hypokalemia: Secondary | ICD-10-CM | POA: Diagnosis present

## 2016-06-23 DIAGNOSIS — I10 Essential (primary) hypertension: Secondary | ICD-10-CM | POA: Diagnosis present

## 2016-06-23 DIAGNOSIS — Z8371 Family history of colonic polyps: Secondary | ICD-10-CM

## 2016-06-23 NOTE — ED Provider Notes (Signed)
[facility] DEPT Provider Note   CSN: [id number] Arrival date & time: 06/23/16  2353  By signing my name below, I, [name], attest that this documentation has been prepared under the direction and in the presence of Ezequiel Essex, MD . Electronically Signed: [name], Scribe. 06/23/2016. 12:06 AM.  History   Chief Complaint Chief Complaint  Patient presents with  . Abdominal Pain    hx chrons   The history is provided by the patient. No language interpreter was used.   HPI Comments: Craig Guzman is a 45 y.o. male with a PMHx of Chron's disease an HTN who presents to the Emergency Department complaining of constant moderate upper pain that started about 2 hours ago. He states he started having pain when laying down after eating dinner. He reports that the pain started off as a "gas bubble" sensation in his mid-back that moved to his abdomen. He reports coming to the ED for an episode of flank pain similar to kidney stone. Today's pain is different than the kidney stone pain. He was given 90m zofran and 100 mcg fentanyl en route by EMS with some relief. He reports associated symptoms of vomiting (8 episodes), diaphoresis, mild back pain, and right flank pain. He describes the emesis as containing red streaks. He states that he is on humira for his Chron's last dose with his last dose being 6 days ago. He admits to a hx of cholcystectomy, endoscopy, and colonoscopy. He denies any hx of cardiac problem, prior hx of similar complaint, hx of DM, diarrhea, fever, chest pain, dysuria, hematuria, blood in stool, testicular pain, or other complaints.     PCP: GWendie Agreste MD  Past Medical History:  Diagnosis Date  . Allergy    trees/ grasses/ animals/dust/mold  . Anxiety   . Biliary dyskinesia   . Colloid thyroid nodule   . Crohn's disease ([place]    Stomach, terminal ileum, cecum  . Gastric ulcer    antral  . GERD (gastroesophageal reflux disease)   . History of  migraine headaches   . HTN (hypertension)   . Kidney stone 09/20/2012  . Migraine   . Obesity   . Pneumonia 02/27/2014   ED    Patient Active Problem List   Diagnosis Date Noted  . Cigarette nicotine dependence without complication 135/70/1779 . Shifting sleep-work schedule, affecting sleep 05/15/2016  . Sleep deprivation 05/15/2016  . Sleep related hypoventilation in conditions classified elsewhere 05/15/2016  . Sleep related headaches 05/15/2016  . Morbid [place] 09/19/2013  . Diarrhea 07/10/2013  . 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 Hypertension 11/26/2012  . Gastroparesis??? 12/08/2011  . Common migraine without aura 12/08/2011  . Long-term use of immunosuppressant medication- Cimzia 08/08/2011  . Crohn's disease of stomach and colon-suspected 05/17/2011  . GERD (gastroesophageal reflux disease) 04/21/2011    Past Surgical History:  Procedure Laterality Date  . CHOLECYSTECTOMY  2011   [place]  . COLONOSCOPY  07/26/11   Crohn's colitis, ileitis  . ESOPHAGOGASTRODUODENOSCOPY  05/04/11, 07/26/11   granulomatous gastritis - Crohn's  . FLEXIBLE SIGMOIDOSCOPY    . JOINT REPLACEMENT    . MULTIPLE TOOTH EXTRACTIONS  2005  . SHOULDER SURGERY     right  . VASECTOMY     bilateral w/lysis of penile adhesions       Home Medications    Prior to Admission medications   Medication Sig Start Date End Date Taking? Authorizing Provider  Adalimumab (HUMIRA PEN) 40 MG/0.8ML PNKT Inject 40 mg  into the skin every 14 (fourteen) days. After completion of the starter 06/06/16   Gatha Mayer, MD  Adalimumab [facility] PEN-CROHNS STARTER) 40 MG/0.8ML PNKT Inject 160 mg into the skin every 14 (fourteen) days. Inject 160 mg into skin on day 0, 80 mg into skin on day 14 06/06/16   Gatha Mayer, MD  Certolizumab Pegol [facility] PREFILLED) 2 X 200 MG/ML KIT Inject 400 mg into the skin every 30 (thirty) days. Inject 400 mg or 2 pens into skin once a month 04/14/15   Gatha Mayer, MD    cyclobenzaprine (FLEXERIL) 10 MG tablet Take 10 mg by mouth 3 (three) times daily as needed (for migraines).    Historical Provider, MD  doxazosin (CARDURA) 1 MG tablet Take 1 tablet (1 mg total) by mouth daily. 06/14/16   [name], MD  HYDROcodone-acetaminophen (NORCO/VICODIN) 5-325 MG tablet Take 1-2 tablets by mouth every 6 (six) hours as needed. 05/28/16   Montine Circle, PA-C  lisinopril-hydrochlorothiazide (PRINZIDE,ZESTORETIC) 20-12.5 MG tablet Take 2 tablets by mouth daily. 03/10/16   [name], MD  omeprazole-sodium bicarbonate (ZEGERID) 40-1100 MG capsule Take 1 capsule by mouth daily with supper.    Historical Provider, MD  prochlorperazine (COMPAZINE) 25 MG suppository Place 25 mg rectally every 12 (twelve) hours as needed for nausea or vomiting.    Historical Provider, MD  rizatriptan (MAXALT-MLT) 10 MG disintegrating tablet Take 1 tablet (10 mg total) by mouth 3 (three) times daily as needed for migraine. 04/06/16   Kathrynn Ducking, MD  verapamil (CALAN-SR) 180 MG CR tablet Take 360 mg by mouth daily.    Historical Provider, MD    Family History Family History  Problem Relation Age of Onset  . Colon polyps Father   . Diabetes Father   . Hypertension Father   . Hypertension Mother   . Asthma Mother   . Heart disease Maternal Grandmother   . Colon cancer Neg Hx   . Rectal cancer Neg Hx   . Stomach cancer Neg Hx   . Esophageal cancer Neg Hx     Social History Social History  Substance Use Topics  . Smoking status: Current Every Day Smoker    Packs/day: 0.50    Years: 21.00    Types: Cigarettes  . Smokeless tobacco: Former Systems developer    Types: Snuff     Comment: Counseling sheet given in exam room   . Alcohol use No     Allergies   Zithromax [azithromycin]   Review of Systems Review of Systems A complete 10 system review of systems was obtained and all systems are negative except as noted in the HPI and PMH.   Physical Exam Updated Vital Signs BP  150/99   Pulse 72   Temp 98 F (36.7 C) (Oral)   Resp 21   Ht 6' 1" (1.854 m)   Wt 300 lb (136.1 kg)   SpO2 100%   BMI 39.58 kg/m   Physical Exam  Constitutional: He is oriented to person, place, and time. He appears well-developed and well-nourished.  Appears uncomfortable.   HENT:  Head: Normocephalic and atraumatic.  Mouth/Throat: Oropharynx is clear and moist. No oropharyngeal exudate.  Eyes: Conjunctivae and EOM are normal. Pupils are equal, round, and reactive to light.  Neck: Normal range of motion. Neck supple.  No meningismus.  Cardiovascular: Normal rate, regular rhythm, normal heart sounds and intact distal pulses.   No murmur heard. Pulmonary/Chest: Effort normal and breath sounds normal. No  respiratory distress.  Abdominal: Soft. There is tenderness. There is guarding. There is no rebound.  Epigastric TTP. Right lower and upper quadrant TTP, worse in the epigastrium with voluntary guarding. No CVA TTP. Intact DP and PT pulses.   Musculoskeletal: Normal range of motion. He exhibits no edema or tenderness.  Neurological: He is alert and oriented to person, place, and time. No cranial nerve deficit. He exhibits normal muscle tone. Coordination normal.   5/5 strength throughout. CN 2-12 intact.Equal grip strength.   Skin: Skin is warm.  Psychiatric: He has a normal mood and affect. His behavior is normal.  Nursing note and vitals reviewed.    ED Treatments / Results  ,DIAGNOSTIC STUDIES: Oxygen Saturation is 100% on RA, normal by my interpretation.    COORDINATION OF CARE: 12:10 AM- Pt advised of plan for treatment and pt agrees.  Labs (all labs ordered are listed, but only abnormal results are displayed) Labs Reviewed  CBC WITH DIFFERENTIAL/PLATELET - Abnormal; Notable for the following:       Result Value   Monocytes Absolute 1.1 (*)    All other components within normal limits  COMPREHENSIVE METABOLIC PANEL - Abnormal; Notable for the following:    CO2 21  (*)    Glucose, Bld 142 (*)    Calcium 8.6 (*)    AST 56 (*)    All other components within normal limits  LIPASE, BLOOD - Abnormal; Notable for the following:    Lipase 3,440 (*)    All other components within normal limits  URINALYSIS, ROUTINE W REFLEX MICROSCOPIC - Abnormal; Notable for the following:    Protein, ur 30 (*)    Squamous Epithelial / LPF 0-5 (*)    All other components within normal limits  COMPREHENSIVE METABOLIC PANEL - Abnormal; Notable for the following:    Glucose, Bld 101 (*)    Calcium 8.4 (*)    AST 42 (*)    All other components within normal limits  LIPID PANEL - Abnormal; Notable for the following:    HDL 31 (*)    LDL Cholesterol 106 (*)    All other components within normal limits  I-STAT CG4 LACTIC ACID, ED - Abnormal; Notable for the following:    Lactic Acid, Venous 3.31 (*)    All other components within normal limits  TROPONIN I  CBC  LACTIC ACID, PLASMA  I-STAT CG4 LACTIC ACID, ED    EKG  EKG Interpretation  Date/Time:  Saturday June 24 2016 00:00:17 EST Ventricular Rate:  76 PR Interval:    QRS Duration: 106 QT Interval:  423 QTC Calculation: 476 R Axis:   74 Text Interpretation:  Sinus rhythm Abnormal inferior Q waves Minimal ST depression, inferior leads Borderline prolonged QT interval No significant change was found Confirmed by Wyvonnia Dusky  MD,  [phone]) on 06/24/2016 12:34:23 AM       Radiology Dg Abdomen Acute W/chest  Result Date: 06/24/2016 CLINICAL DATA:  Abdominal pain and vomiting. EXAM: DG ABDOMEN ACUTE W/ 1V CHEST COMPARISON:  CT 05/28/2016 FINDINGS: The abdominal gas pattern is negative for obstruction or perforation. Stool and air is present throughout the colon. Cholecystectomy clips are visible in the right upper quadrant. The upright view of the chest demonstrates mild cardiomegaly, unchanged from 02/27/2014. No acute cardiopulmonary findings. IMPRESSION: Negative abdominal radiographs.  No acute  cardiopulmonary disease. Electronically Signed   By: Andreas Newport M.D.   On: 06/24/2016 01:09   Ct Angio Chest/abd/pel For Dissection W And/or Wo Contrast  Result Date: 06/24/2016 CLINICAL DATA:  Chest and back pain EXAM: CT ANGIOGRAPHY CHEST, ABDOMEN AND PELVIS TECHNIQUE: Multidetector CT imaging through the chest, abdomen and pelvis was performed using the standard protocol during bolus administration of intravenous contrast. Multiplanar reconstructed images and MIPs were obtained and reviewed to evaluate the vascular anatomy. CONTRAST:  100 mL Isovue 370 COMPARISON:  None. FINDINGS: CTA CHEST FINDINGS Cardiovascular: Precontrast imaging shows no evidence of aortic intramural hematoma. There is a normal variant aortic arch branching pattern with the brachiocephalic and left common carotid arteries sharing a common origin. There is no aortic dissection or aneurysm. The pulmonary arteries are normal, within the limitation that the timing of this study with optimized for the aorta. Mediastinum/Nodes: No mediastinal, hilar or axillary lymphadenopathy. The visualized thyroid and thoracic esophageal course are unremarkable. Lungs/Pleura: There is bibasilar dependent subsegmental atelectasis. Musculoskeletal: No chest wall abnormality. No acute or significant osseous findings. Review of the MIP images confirms the above findings. CTA ABDOMEN AND PELVIS FINDINGS VASCULAR Aorta: Normal caliber aorta without aneurysm, dissection, vasculitis or significant stenosis. Celiac: Patent without evidence of aneurysm, dissection, vasculitis or significant stenosis. SMA: Patent without evidence of aneurysm, dissection, vasculitis or significant stenosis. Renals: Both renal arteries are patent without evidence of aneurysm, dissection, vasculitis, fibromuscular dysplasia or significant stenosis. IMA: Patent without evidence of aneurysm, dissection, vasculitis or significant stenosis. Inflow: Patent without evidence of  aneurysm, dissection, vasculitis or significant stenosis. Review of the MIP images confirms the above findings. NON-VASCULAR Hepatobiliary: Status post cholecystectomy. There is mild hepatomegaly. No focal liver lesion. Pancreas: There is inflammatory stranding surrounding the pancreas, greatest at the body and head. No focal fluid collection. The pancreatic duct is not dilated. Spleen: Normal Adrenals/Urinary Tract: The adrenal glands are normal. Both kidneys are normal. Stomach/Bowel: No dilated small bowel or other evidence of obstruction. No enteric or colonic inflammation. Despite review of multiplanar reformatted images in 3 orthogonal planes, the appendix could not be adequately visualized. However, there is no inflammatory stranding or free fluid within the right lower quadrant. Lymphatic: There are multiple celiac reactive lymph nodes. Reproductive: Normal prostate and seminal vesicles. Other: No abdominal wall hernia or abnormality. No abdominopelvic ascites. Musculoskeletal: No acute or significant osseous findings. Review of the MIP images confirms the above findings. IMPRESSION: 1. Normal aorta without acute aortic syndrome. 2. Acute pancreatitis without focal fluid collection. Electronically Signed   By: Ulyses Jarred M.D.   On: 06/24/2016 02:26    Procedures Procedures (including critical care time)  Medications Ordered in ED Medications  sodium chloride 0.9 % bolus 1,000 mL (1,000 mLs Intravenous New Bag/Given 06/24/16 0024)  HYDROmorphone (DILAUDID) injection 1 mg (1 mg Intravenous Given 06/24/16 0025)  ondansetron (ZOFRAN) injection 4 mg (4 mg Intravenous Given 06/24/16 0024)     Initial Impression / Assessment and Plan / ED Course  I have reviewed the triage vital signs and the nursing notes.  Pertinent labs & imaging results that were available during my care of the patient were reviewed by me and considered in my medical decision making (see chart for details).  Clinical  Course   Patient with acute onset of upper abdominal pain with vomiting and diaphoresis. History of Crohn's disease and kidney stones. He's never had this kind of pain before. Patient is diaphoretic and uncomfortable appearing unchanged.  Labs obtained which show elevated a lactate. Patient given IV fluids, anti-emetics and narcotics. Abdominal series is negative for perforation.  Lipase elevated greater than 3000. LFTs are normal. Patient is  afebrile. CTA negative for aortic dissection. Does show pancreatitis without abscess or cyst.  Patient remains uncomfortable with significant nausea and pain. Will be hydrated additionally given more medications. Admission discussed with Dr. Loleta Books.  Final Clinical Impressions(s) / ED Diagnoses   Final diagnoses:  Acute pancreatitis, unspecified complication status, unspecified pancreatitis type    New Prescriptions New Prescriptions   No medications on file   I personally performed the services described in this documentation, which was scribed in my presence. The recorded information has been reviewed and is accurate.     Ezequiel Essex, MD 06/24/16 [phone]

## 2016-06-24 ENCOUNTER — Emergency Department (HOSPITAL_COMMUNITY): Payer: Managed Care, Other (non HMO)

## 2016-06-24 ENCOUNTER — Encounter (HOSPITAL_COMMUNITY): Payer: Self-pay | Admitting: Emergency Medicine

## 2016-06-24 DIAGNOSIS — Z23 Encounter for immunization: Secondary | ICD-10-CM | POA: Diagnosis not present

## 2016-06-24 DIAGNOSIS — K853 Drug induced acute pancreatitis without necrosis or infection: Secondary | ICD-10-CM | POA: Diagnosis present

## 2016-06-24 DIAGNOSIS — I10 Essential (primary) hypertension: Secondary | ICD-10-CM

## 2016-06-24 DIAGNOSIS — K50918 Crohn's disease, unspecified, with other complication: Secondary | ICD-10-CM | POA: Diagnosis not present

## 2016-06-24 DIAGNOSIS — Z8249 Family history of ischemic heart disease and other diseases of the circulatory system: Secondary | ICD-10-CM | POA: Diagnosis not present

## 2016-06-24 DIAGNOSIS — K5010 Crohn's disease of large intestine without complications: Secondary | ICD-10-CM | POA: Diagnosis present

## 2016-06-24 DIAGNOSIS — F17210 Nicotine dependence, cigarettes, uncomplicated: Secondary | ICD-10-CM | POA: Diagnosis present

## 2016-06-24 DIAGNOSIS — Z6839 Body mass index (BMI) 39.0-39.9, adult: Secondary | ICD-10-CM | POA: Diagnosis not present

## 2016-06-24 DIAGNOSIS — K859 Acute pancreatitis without necrosis or infection, unspecified: Secondary | ICD-10-CM | POA: Diagnosis not present

## 2016-06-24 DIAGNOSIS — Z79899 Other long term (current) drug therapy: Secondary | ICD-10-CM | POA: Diagnosis not present

## 2016-06-24 DIAGNOSIS — Z833 Family history of diabetes mellitus: Secondary | ICD-10-CM | POA: Diagnosis not present

## 2016-06-24 DIAGNOSIS — E669 Obesity, unspecified: Secondary | ICD-10-CM | POA: Diagnosis present

## 2016-06-24 DIAGNOSIS — Y92009 Unspecified place in unspecified non-institutional (private) residence as the place of occurrence of the external cause: Secondary | ICD-10-CM | POA: Diagnosis not present

## 2016-06-24 DIAGNOSIS — E876 Hypokalemia: Secondary | ICD-10-CM | POA: Diagnosis present

## 2016-06-24 DIAGNOSIS — Z720 Tobacco use: Secondary | ICD-10-CM | POA: Diagnosis not present

## 2016-06-24 DIAGNOSIS — T502X5A Adverse effect of carbonic-anhydrase inhibitors, benzothiadiazides and other diuretics, initial encounter: Secondary | ICD-10-CM | POA: Diagnosis present

## 2016-06-24 DIAGNOSIS — Z8371 Family history of colonic polyps: Secondary | ICD-10-CM | POA: Diagnosis not present

## 2016-06-24 DIAGNOSIS — Z825 Family history of asthma and other chronic lower respiratory diseases: Secondary | ICD-10-CM | POA: Diagnosis not present

## 2016-06-24 LAB — CBC WITH DIFFERENTIAL/PLATELET
BASOS PCT: 0 %
Basophils Absolute: 0 10*3/uL (ref 0.0–0.1)
EOS ABS: 0.2 10*3/uL (ref 0.0–0.7)
Eosinophils Relative: 2 %
HEMATOCRIT: 45.1 % (ref 39.0–52.0)
Hemoglobin: 15.3 g/dL (ref 13.0–17.0)
Lymphocytes Relative: 30 %
Lymphs Abs: 2.6 10*3/uL (ref 0.7–4.0)
MCH: 28.9 pg (ref 26.0–34.0)
MCHC: 33.9 g/dL (ref 30.0–36.0)
MCV: 85.1 fL (ref 78.0–100.0)
MONO ABS: 1.1 10*3/uL — AB (ref 0.1–1.0)
MONOS PCT: 12 %
NEUTROS ABS: 5 10*3/uL (ref 1.7–7.7)
Neutrophils Relative %: 56 %
Platelets: 161 10*3/uL (ref 150–400)
RBC: 5.3 MIL/uL (ref 4.22–5.81)
RDW: 14.7 % (ref 11.5–15.5)
WBC: 8.9 10*3/uL (ref 4.0–10.5)

## 2016-06-24 LAB — COMPREHENSIVE METABOLIC PANEL
ALBUMIN: 3.5 g/dL (ref 3.5–5.0)
ALBUMIN: 3.6 g/dL (ref 3.5–5.0)
ALT: 44 U/L (ref 17–63)
ALT: 42 U/L (ref 17–63)
AST: 56 U/L — ABNORMAL HIGH (ref 15–41)
AST: 42 U/L — AB (ref 15–41)
Alkaline Phosphatase: 117 U/L (ref 38–126)
Alkaline Phosphatase: 117 U/L (ref 38–126)
Anion gap: 10 (ref 5–15)
Anion gap: 7 (ref 5–15)
BILIRUBIN TOTAL: 0.8 mg/dL (ref 0.3–1.2)
BUN: 10 mg/dL (ref 6–20)
BUN: 9 mg/dL (ref 6–20)
CHLORIDE: 109 mmol/L (ref 101–111)
CO2: 21 mmol/L — ABNORMAL LOW (ref 22–32)
CO2: 25 mmol/L (ref 22–32)
Calcium: 8.6 mg/dL — ABNORMAL LOW (ref 8.9–10.3)
Calcium: 8.4 mg/dL — ABNORMAL LOW (ref 8.9–10.3)
Chloride: 107 mmol/L (ref 101–111)
Creatinine, Ser: 1.17 mg/dL (ref 0.61–1.24)
Creatinine, Ser: 1.09 mg/dL (ref 0.61–1.24)
GFR calc Af Amer: >60 mL/min (ref >60)
GFR calc Af Amer: >60 mL/min (ref >60)
GFR calc non Af Amer: >60 mL/min (ref >60)
GLUCOSE: 101 mg/dL — AB (ref 65–99)
GLUCOSE: 142 mg/dL — AB (ref 65–99)
POTASSIUM: 3.6 mmol/L (ref 3.5–5.1)
POTASSIUM: 3.9 mmol/L (ref 3.5–5.1)
Sodium: 138 mmol/L (ref 135–145)
Sodium: 141 mmol/L (ref 135–145)
TOTAL PROTEIN: 7 g/dL (ref 6.5–8.1)
Total Bilirubin: 0.7 mg/dL (ref 0.3–1.2)
Total Protein: 6.9 g/dL (ref 6.5–8.1)

## 2016-06-24 LAB — LIPID PANEL
Cholesterol: 152 mg/dL (ref 0–200)
HDL: 31 mg/dL — AB (ref >40)
LDL CALC: 106 mg/dL — AB (ref 0–99)
Total CHOL/HDL Ratio: 4.9 RATIO
Triglycerides: 77 mg/dL (ref <150)
VLDL: 15 mg/dL (ref 0–40)

## 2016-06-24 LAB — CBC
HEMATOCRIT: 42.1 % (ref 39.0–52.0)
Hemoglobin: 14.2 g/dL (ref 13.0–17.0)
MCH: 28.2 pg (ref 26.0–34.0)
MCHC: 33.7 g/dL (ref 30.0–36.0)
MCV: 83.7 fL (ref 78.0–100.0)
Platelets: 171 10*3/uL (ref 150–400)
RBC: 5.03 MIL/uL (ref 4.22–5.81)
RDW: 14.4 % (ref 11.5–15.5)
WBC: 9.7 10*3/uL (ref 4.0–10.5)

## 2016-06-24 LAB — URINALYSIS, ROUTINE W REFLEX MICROSCOPIC
Bacteria, UA: NONE SEEN
Bilirubin Urine: NEGATIVE
Glucose, UA: NEGATIVE mg/dL
Hgb urine dipstick: NEGATIVE
Ketones, ur: NEGATIVE mg/dL
Leukocytes, UA: NEGATIVE
Nitrite: NEGATIVE
PROTEIN: 30 mg/dL — AB
Specific Gravity, Urine: 1.011 (ref 1.005–1.030)
pH: 7 (ref 5.0–8.0)

## 2016-06-24 LAB — LACTIC ACID, PLASMA: LACTIC ACID, VENOUS: 0.8 mmol/L (ref 0.5–1.9)

## 2016-06-24 LAB — LIPASE, BLOOD: LIPASE: 3440 U/L — AB (ref 11–51)

## 2016-06-24 LAB — TROPONIN I: Troponin I: <0.03 ng/mL (ref <0.03)

## 2016-06-24 LAB — I-STAT CG4 LACTIC ACID, ED
LACTIC ACID, VENOUS: 0.82 mmol/L (ref 0.5–1.9)
LACTIC ACID, VENOUS: 3.31 mmol/L — AB (ref 0.5–1.9)

## 2016-06-24 MED ORDER — DOXAZOSIN MESYLATE 1 MG PO TABS
1.0000 mg | ORAL_TABLET | Freq: Every day | ORAL | Status: DC
  Administered 2016-06-24 – 2016-06-26 (×3): 1 mg via ORAL
  Filled 2016-06-24 (×3): qty 1

## 2016-06-24 MED ORDER — LISINOPRIL-HYDROCHLOROTHIAZIDE 20-12.5 MG PO TABS: 2.0000 | ORAL_TABLET | Freq: Every day | ORAL | Status: DC

## 2016-06-24 MED ORDER — HYDROCHLOROTHIAZIDE 25 MG PO TABS
25.0000 mg | ORAL_TABLET | Freq: Every day | ORAL | Status: DC
  Administered 2016-06-24: 25 mg via ORAL
  Filled 2016-06-24: qty 1

## 2016-06-24 MED ORDER — INFLUENZA VAC SPLIT QUAD 0.5 ML IM SUSY
0.5000 mL | PREFILLED_SYRINGE | INTRAMUSCULAR | Status: AC | PRN
  Administered 2016-06-26: 0.5 mL via INTRAMUSCULAR
  Filled 2016-06-24: qty 0.5

## 2016-06-24 MED ORDER — HYDROMORPHONE HCL 2 MG/ML IJ SOLN
1.0000 mg | INTRAMUSCULAR | Status: DC | PRN
  Administered 2016-06-24: 1 mg via INTRAVENOUS
  Administered 2016-06-24 (×2): 1.5 mg via INTRAVENOUS
  Administered 2016-06-24: 1 mg via INTRAVENOUS
  Administered 2016-06-24 – 2016-06-25 (×4): 1.5 mg via INTRAVENOUS
  Filled 2016-06-24 (×8): qty 1

## 2016-06-24 MED ORDER — IOPAMIDOL (ISOVUE-370) INJECTION 76%
INTRAVENOUS | Status: AC
  Administered 2016-06-24: 100 mL
  Filled 2016-06-24: qty 100

## 2016-06-24 MED ORDER — HYDROMORPHONE HCL 2 MG/ML IJ SOLN
1.0000 mg | Freq: Once | INTRAMUSCULAR | Status: AC
  Administered 2016-06-24: 1 mg via INTRAVENOUS
  Filled 2016-06-24: qty 1

## 2016-06-24 MED ORDER — SODIUM CHLORIDE 0.9 % IV SOLN: INTRAVENOUS | Status: DC

## 2016-06-24 MED ORDER — ONDANSETRON HCL 4 MG/2ML IJ SOLN
4.0000 mg | Freq: Once | INTRAMUSCULAR | Status: AC
  Administered 2016-06-24: 4 mg via INTRAVENOUS
  Filled 2016-06-24: qty 2

## 2016-06-24 MED ORDER — ENOXAPARIN SODIUM 40 MG/0.4ML ~~LOC~~ SOLN
40.0000 mg | Freq: Every day | SUBCUTANEOUS | Status: DC
  Administered 2016-06-24 – 2016-06-26 (×3): 40 mg via SUBCUTANEOUS
  Filled 2016-06-24 (×3): qty 0.4

## 2016-06-24 MED ORDER — ONDANSETRON HCL 4 MG PO TABS: 4.0000 mg | ORAL_TABLET | Freq: Four times a day (QID) | ORAL | Status: DC | PRN

## 2016-06-24 MED ORDER — LISINOPRIL 40 MG PO TABS
40.0000 mg | ORAL_TABLET | Freq: Every day | ORAL | Status: DC
  Administered 2016-06-24 – 2016-06-26 (×3): 40 mg via ORAL
  Filled 2016-06-24 (×3): qty 1

## 2016-06-24 MED ORDER — SODIUM CHLORIDE 0.9 % IV BOLUS (SEPSIS)
1000.0000 mL | Freq: Once | INTRAVENOUS | Status: AC
  Administered 2016-06-24: 1000 mL via INTRAVENOUS

## 2016-06-24 MED ORDER — SODIUM CHLORIDE 0.9 % IV SOLN
INTRAVENOUS | Status: DC
  Administered 2016-06-24 – 2016-06-26 (×7): via INTRAVENOUS

## 2016-06-24 MED ORDER — ONDANSETRON HCL 4 MG/2ML IJ SOLN
4.0000 mg | Freq: Four times a day (QID) | INTRAMUSCULAR | Status: DC | PRN
  Administered 2016-06-24 – 2016-06-25 (×3): 4 mg via INTRAVENOUS
  Filled 2016-06-24 (×3): qty 2

## 2016-06-24 MED ORDER — VERAPAMIL HCL ER 180 MG PO TBCR
360.0000 mg | EXTENDED_RELEASE_TABLET | Freq: Every day | ORAL | Status: DC
  Administered 2016-06-24 – 2016-06-26 (×3): 360 mg via ORAL
  Filled 2016-06-24 (×4): qty 2

## 2016-06-24 NOTE — H&P (Signed)
History and Physical  Patient Name: Craig Guzman     DHR:[id number]    DOB: 04-20-1971    DOA: 06/23/2016 PCP: [name], MD   Patient coming from: Home  Chief Complaint: Epigastric pain and vomiting  HPI: Craig Guzman is a 45 y.o. male with a past medical history significant for Crohn's disease back on Humira, obesity and HTN who presents with acute severe epigastric pain and vomiting.  She was in his usual state of health until tonight a few hours after eating when he had onset of epigastric pain, they crescendoed to severe abdominal pain radiating to the back associated with unrelenting nausea, vomiting and diaphoresis, so he came to the ER.  ED course: -Afebrile heart rate 73, respirations and pulse ox normal, blood pressure 142/95 -Na 138, K 3.6, Cr 1.17 (baseline 1.2), WBC 8.9K, Hgb 15.3 -Lipase 3440 -Troponin negative -Lactate 3.31 -Urinalysis negative -CTA of the chest abdomen and pelvis showed no dissection or PE or SBO, but findings consistent with pancreatitis -He was given 2L fluids, hydromorphone and had continued severe pain, so [facility] were asked to evaluate for admission for pancreatitis.   He had his gallbladder removed.  He denies alcohol use.  He has never had pancreatitis.  His only new medication change recently is Humira, which he took in the past without problems.  No family history of pancreatitis.     ROS: Review of Systems  Constitutional: Positive for diaphoresis.  Gastrointestinal: Positive for abdominal pain, nausea and vomiting.  All other systems reviewed and are negative.         Past Medical History:  Diagnosis Date  . Allergy    trees/ grasses/ animals/dust/mold  . Anxiety   . Biliary dyskinesia   . Colloid thyroid nodule   . Crohn's disease ([place])    Stomach, terminal ileum, cecum  . Gastric ulcer    antral  . GERD (gastroesophageal reflux disease)   . History of migraine headaches   . HTN (hypertension)   . Kidney stone  09/20/2012  . Migraine   . Obesity   . Pneumonia 02/27/2014   ED    Past Surgical History:  Procedure Laterality Date  . CHOLECYSTECTOMY  2011   [place]  . COLONOSCOPY  07/26/11   Crohn's colitis, ileitis  . ESOPHAGOGASTRODUODENOSCOPY  05/04/11, 07/26/11   granulomatous gastritis - Crohn's  . FLEXIBLE SIGMOIDOSCOPY    . JOINT REPLACEMENT    . MULTIPLE TOOTH EXTRACTIONS  2005  . SHOULDER SURGERY     right  . VASECTOMY     bilateral w/lysis of penile adhesions    Social History: Patient lives with his wife and four children.  The patient walks unassisted.  He works at [employer], Sales executive.  He smokes.    Allergies  Allergen Reactions  . Zithromax [Azithromycin] Hypertension    Family history: family history includes Asthma in his mother; Colon polyps in his father; Diabetes in his father; Heart disease in his maternal grandmother; Hypertension in his father and mother.  Prior to Admission medications   Medication Sig Start Date End Date Taking? Authorizing Provider  Adalimumab (HUMIRA PEN-CROHNS STARTER) 40 MG/0.8ML PNKT Inject 160 mg into the skin every 14 (fourteen) days. Inject 160 mg into skin on day 0, 80 mg into skin on day 14 06/06/16  Yes Gatha Mayer, MD  cyclobenzaprine (FLEXERIL) 10 MG tablet Take 10 mg by mouth 3 (three) times daily as needed (for migraines).   Yes Historical Provider, MD  doxazosin (CARDURA) 1 MG tablet Take 1 tablet (1 mg total) by mouth daily. 06/14/16  Yes [name], MD  HYDROcodone-acetaminophen (NORCO/VICODIN) 5-325 MG tablet Take 1-2 tablets by mouth every 6 (six) hours as needed. Patient taking differently: Take 1-2 tablets by mouth every 6 (six) hours as needed for moderate pain.  05/28/16  Yes Montine Circle, PA-C  lisinopril-hydrochlorothiazide (PRINZIDE,ZESTORETIC) 20-12.5 MG tablet Take 2 tablets by mouth daily. 03/10/16  Yes [name], MD  omeprazole-sodium bicarbonate (ZEGERID) 40-1100 MG capsule Take 1 capsule by  mouth daily with supper.   Yes Historical Provider, MD  prochlorperazine (COMPAZINE) 25 MG suppository Place 25 mg rectally every 12 (twelve) hours as needed for nausea or vomiting.   Yes Historical Provider, MD  rizatriptan (MAXALT-MLT) 10 MG disintegrating tablet Take 1 tablet (10 mg total) by mouth 3 (three) times daily as needed for migraine. 04/06/16  Yes Kathrynn Ducking, MD  verapamil (CALAN-SR) 180 MG CR tablet Take 360 mg by mouth daily.   Yes Historical Provider, MD  Adalimumab (HUMIRA PEN) 40 MG/0.8ML PNKT Inject 40 mg into the skin every 14 (fourteen) days. After completion of the starter Patient not taking: Reported on 06/24/2016 06/06/16   Gatha Mayer, MD       Physical Exam: BP 158/86   Pulse 73   Temp 98 F (36.7 C) (Oral)   Resp 18   Ht 6' 1"  (1.854 m)   Wt 136.1 kg (300 lb)   SpO2 99%   BMI 39.58 kg/m  General appearance: Obese adult male, alert and in moderate distress from pain, lying still in bed.   Eyes: Anicteric, conjunctiva pink, lids and lashes normal. PERRL.    ENT: No nasal deformity, discharge, epistaxis.  Hearing normal. OP moist without lesions.   Skin: Warm and dry.    No suspicious rashes or lesions. Cardiac: RRR, nl S1-S2, no murmurs appreciated.  Capillary refill is brisk.  JVP not visible.  No LE edema.  Radial pulses 2+ and symmetric.  DP pulses diminished. Respiratory: Normal respiratory rate and rhythm.  CTAB without rales or wheezes. Abdomen: Abdomen soft.  Severe TTP in epigastrium, RUQ, R side, without guarding, rigidity, or rebound. No ascites, distension.  Liver edge not palpable given habitus. MSK: No deformities or effusions.  No cyanosis or clubbing. Neuro: Cranial nerves normal.  Sensation intact to light touch. Speech is fluent.  Muscle strength normal.    Psych: Sensorium intact and responding to questions, attention normal.  Behavior appropriate.  Affect normal, just in pain.  Judgment and insight appear normal.     Labs on  Admission:  I have personally reviewed following labs and imaging studies: CBC:  Recent Labs Lab 06/24/16 0016  WBC 8.9  NEUTROABS 5.0  HGB 15.3  HCT 45.1  MCV 85.1  PLT 938   Basic Metabolic Panel:  Recent Labs Lab 06/24/16 0016  NA 138  K 3.6  CL 107  CO2 21*  GLUCOSE 142*  BUN 10  CREATININE 1.17  CALCIUM 8.6*   GFR: Estimated Creatinine Clearance: 116.7 mL/min (by C-G formula based on SCr of 1.17 mg/dL).  Liver Function Tests:  Recent Labs Lab 06/24/16 0016  AST 56*  ALT 44  ALKPHOS 117  BILITOT 0.8  PROT 7.0  ALBUMIN 3.6    Recent Labs Lab 06/24/16 0016  LIPASE 3,440*   No results for input(s): AMMONIA in the last 168 hours. Coagulation Profile: No results for input(s): INR, PROTIME in the last 168  hours. Cardiac Enzymes:  Recent Labs Lab 06/24/16 0016  TROPONINI <0.03   BNP (last 3 results) No results for input(s): PROBNP in the last 8760 hours. HbA1C: No results for input(s): HGBA1C in the last 72 hours. CBG: No results for input(s): GLUCAP in the last 168 hours. Lipid Profile: No results for input(s): CHOL, HDL, LDLCALC, TRIG, CHOLHDL, LDLDIRECT in the last 72 hours. Thyroid Function Tests: No results for input(s): TSH, T4TOTAL, FREET4, T3FREE, THYROIDAB in the last 72 hours. Anemia Panel: No results for input(s): VITAMINB12, FOLATE, FERRITIN, TIBC, IRON, RETICCTPCT in the last 72 hours. Sepsis Labs: Lactic acid 3.31 Invalid input(s): PROCALCITONIN, LACTICIDVEN No results found for this or any previous visit (from the past 240 hour(s)).       Radiological Exams on Admission: Personally reviewed CXR without airspace disease, CT report reviewed: Dg Abdomen Acute W/chest  Result Date: 06/24/2016 CLINICAL DATA:  Abdominal pain and vomiting. EXAM: DG ABDOMEN ACUTE W/ 1V CHEST COMPARISON:  CT 05/28/2016 FINDINGS: The abdominal gas pattern is negative for obstruction or perforation. Stool and air is present throughout the colon.  Cholecystectomy clips are visible in the right upper quadrant. The upright view of the chest demonstrates mild cardiomegaly, unchanged from 02/27/2014. No acute cardiopulmonary findings. IMPRESSION: Negative abdominal radiographs.  No acute cardiopulmonary disease. Electronically Signed   By: Andreas Newport M.D.   On: 06/24/2016 01:09   Ct Angio Chest/abd/pel For Dissection W And/or Wo Contrast  Result Date: 06/24/2016 CLINICAL DATA:  Chest and back pain EXAM: CT ANGIOGRAPHY CHEST, ABDOMEN AND PELVIS TECHNIQUE: Multidetector CT imaging through the chest, abdomen and pelvis was performed using the standard protocol during bolus administration of intravenous contrast. Multiplanar reconstructed images and MIPs were obtained and reviewed to evaluate the vascular anatomy. CONTRAST:  100 mL Isovue 370 COMPARISON:  None. FINDINGS: CTA CHEST FINDINGS Cardiovascular: Precontrast imaging shows no evidence of aortic intramural hematoma. There is a normal variant aortic arch branching pattern with the brachiocephalic and left common carotid arteries sharing a common origin. There is no aortic dissection or aneurysm. The pulmonary arteries are normal, within the limitation that the timing of this study with optimized for the aorta. Mediastinum/Nodes: No mediastinal, hilar or axillary lymphadenopathy. The visualized thyroid and thoracic esophageal course are unremarkable. Lungs/Pleura: There is bibasilar dependent subsegmental atelectasis. Musculoskeletal: No chest wall abnormality. No acute or significant osseous findings. Review of the MIP images confirms the above findings. CTA ABDOMEN AND PELVIS FINDINGS VASCULAR Aorta: Normal caliber aorta without aneurysm, dissection, vasculitis or significant stenosis. Celiac: Patent without evidence of aneurysm, dissection, vasculitis or significant stenosis. SMA: Patent without evidence of aneurysm, dissection, vasculitis or significant stenosis. Renals: Both renal arteries are  patent without evidence of aneurysm, dissection, vasculitis, fibromuscular dysplasia or significant stenosis. IMA: Patent without evidence of aneurysm, dissection, vasculitis or significant stenosis. Inflow: Patent without evidence of aneurysm, dissection, vasculitis or significant stenosis. Review of the MIP images confirms the above findings. NON-VASCULAR Hepatobiliary: Status post cholecystectomy. There is mild hepatomegaly. No focal liver lesion. Pancreas: There is inflammatory stranding surrounding the pancreas, greatest at the body and head. No focal fluid collection. The pancreatic duct is not dilated. Spleen: Normal Adrenals/Urinary Tract: The adrenal glands are normal. Both kidneys are normal. Stomach/Bowel: No dilated small bowel or other evidence of obstruction. No enteric or colonic inflammation. Despite review of multiplanar reformatted images in 3 orthogonal planes, the appendix could not be adequately visualized. However, there is no inflammatory stranding or free fluid within the right lower quadrant. Lymphatic:  There are multiple celiac reactive lymph nodes. Reproductive: Normal prostate and seminal vesicles. Other: No abdominal wall hernia or abnormality. No abdominopelvic ascites. Musculoskeletal: No acute or significant osseous findings. Review of the MIP images confirms the above findings. IMPRESSION: 1. Normal aorta without acute aortic syndrome. 2. Acute pancreatitis without focal fluid collection. Electronically Signed   By: Ulyses Jarred M.D.   On: 06/24/2016 02:26    EKG: Independently reviewed. Rate 76, QTc 476, normal sinus rhythm.       Assessment/Plan  1. Acute pancreatitis:  No alcohol use.  Post-chole.  No previous episodes.  Recently started Humira, also on ACEi. -NPO and MIVF -Hydromorphone IV -Ondansetron PRN -Check lipids -Post-cholecystectomy but will trend CMP and if rising, obtain MRCP -Trend lactic acid to normal -Smoking cessation recommended, specific  modalities discussed    2. Crohn's disease:  No active symptoms.  3. HTN:  -Continue doxazosin, verapamil, lisinopril-HCTZ  4. Tobacco use:  Has been intolerant to Chantix and nicotine patches in the past.           DVT prophylaxis: Lovenox  Code Status: FULL  Family Communication: Wife at bedside  Disposition Plan: Anticipate IV fluids and pain medicines and conservative mgmt of pancreatitis. Consults called: None Admission status: INPATIENT, med surg    Medical decision making: Patient seen at 3:15 AM on 06/24/2016.  The patient was discussed with Dr. Wyvonnia Dusky.  What exists of the patient's chart was reviewed in depth and summarized above.  Clinical condition: stable.        Edwin Dada [facility] Pager [phone]

## 2016-06-24 NOTE — Progress Notes (Signed)
PROGRESS NOTE    Craig Guzman  FYB:[id number] DOB: [birth year] DOA: 06/23/2016 PCP: [name], MD   Chief Complaint  Patient presents with  . Abdominal Pain    hx chrons     Brief Narrative:  HPI on 06/24/2016 by Dr. Myrene Buddy Mccoy Testa is a 45 y.o. male with a past medical history significant for Crohn's disease back on Humira, obesity and HTN who presents with acute severe epigastric pain and vomiting.  She was in his usual state of health until tonight a few hours after eating when he had onset of epigastric pain, they crescendoed to severe abdominal pain radiating to the back associated with unrelenting nausea, vomiting and diaphoresis, so he came to the ER.  Assessment & Plan   Acute pancreatitis -Unknown etiology, possibly HCTZ  -CTA chest,abd/pelvis: normal aorta, acute pancreatitis without focal fluid collection  -Denies alcohol use, had cholecystectomy -uses HCTZ- will discontinue -Recently started Humira -LFTs: AST 42, Alk phos 117 -Triglycerides 77 -Lipase upon admission 3440 -Lactic acid normal -Continue IVF, pain control and antiemetics PRN  Crohn's disease -No active symptoms.  HTN -Continue doxazosin, verapamil, lisinopril -Discontinued HCTZ  Tobacco use -Has been intolerant to Chantix and nicotine patches in the past.  DVT Prophylaxis  Lovenox  Code Status: Full  Family Communication: Family at bedside  Disposition Plan: Admitted.   Consultants None  Procedures  None  Antibiotics   Anti-infectives    None      Subjective:   Craig Guzman seen and examined today.  Patient has some abdominal pain and continued nausea. Denies chest pain, shortness of breath, dizziness or headache. Denies alcohol intake or changes in his medications.   Objective:   Vitals:   06/24/16 0359 06/24/16 0400 06/24/16 0429 06/24/16 0522  BP: 154/81 155/87 (!) 175/86 (!) 162/90  Pulse: (!) 50 (!) 50 (!) 51 63  Resp: 18  18 18     Temp:   97.7 F (36.5 C) 97.4 F (36.3 C)  TempSrc:   Oral Oral  SpO2: 97% 97% 100% 97%  Weight:   136.1 kg (300 lb)   Height:   6' 1"  (1.854 m)     Intake/Output Summary (Last 24 hours) at 06/24/16 1244 Last data filed at 06/24/16 1009  Gross per 24 hour  Intake           1212.5 ml  Output              800 ml  Net            412.5 ml   Filed Weights   06/24/16 0002 06/24/16 0429  Weight: 136.1 kg (300 lb) 136.1 kg (300 lb)    Exam  General: Well developed, well nourished, NAD, appears stated age  HEENT: NCAT, mucous membranes moist.   Cardiovascular: S1 S2 auscultated, no rubs, murmurs or gallops. Regular rate and rhythm.  Respiratory: Clear to auscultation bilaterally with equal chest rise  Abdomen: Soft, obese, diffuse TTP-epigastric/RUQ, nondistended, + bowel sounds  Extremities: warm dry without cyanosis clubbing or edema  Neuro: AAOx3, nonfocal  Psych: Normal affect and demeanor   Data Reviewed: I have personally reviewed following labs and imaging studies  CBC:  Recent Labs Lab 06/24/16 0016 06/24/16 0540  WBC 8.9 9.7  NEUTROABS 5.0  --   HGB 15.3 14.2  HCT 45.1 42.1  MCV 85.1 83.7  PLT 161 527   Basic Metabolic Panel:  Recent Labs Lab 06/24/16 0016 06/24/16 0540  NA 138  141  K 3.6 3.9  CL 107 109  CO2 21* 25  GLUCOSE 142* 101*  BUN 10 9  CREATININE 1.17 1.09  CALCIUM 8.6* 8.4*   GFR: Estimated Creatinine Clearance: 125.3 mL/min (by C-G formula based on SCr of 1.09 mg/dL). Liver Function Tests:  Recent Labs Lab 06/24/16 0016 06/24/16 0540  AST 56* 42*  ALT 44 42  ALKPHOS 117 117  BILITOT 0.8 0.7  PROT 7.0 6.9  ALBUMIN 3.6 3.5    Recent Labs Lab 06/24/16 0016  LIPASE 3,440*   No results for input(s): AMMONIA in the last 168 hours. Coagulation Profile: No results for input(s): INR, PROTIME in the last 168 hours. Cardiac Enzymes:  Recent Labs Lab 06/24/16 0016  TROPONINI <0.03   BNP (last 3 results) No results  for input(s): PROBNP in the last 8760 hours. HbA1C: No results for input(s): HGBA1C in the last 72 hours. CBG: No results for input(s): GLUCAP in the last 168 hours. Lipid Profile:  Recent Labs  06/24/16 0540  CHOL 152  HDL 31*  LDLCALC 106*  TRIG 77  CHOLHDL 4.9   Thyroid Function Tests: No results for input(s): TSH, T4TOTAL, FREET4, T3FREE, THYROIDAB in the last 72 hours. Anemia Panel: No results for input(s): VITAMINB12, FOLATE, FERRITIN, TIBC, IRON, RETICCTPCT in the last 72 hours. Urine analysis:    Component Value Date/Time   COLORURINE YELLOW 06/24/2016 0114   APPEARANCEUR CLEAR 06/24/2016 0114   LABSPEC 1.011 06/24/2016 0114   PHURINE 7.0 06/24/2016 0114   GLUCOSEU NEGATIVE 06/24/2016 0114   HGBUR NEGATIVE 06/24/2016 0114   BILIRUBINUR NEGATIVE 06/24/2016 0114   BILIRUBINUR small (A) 03/25/2016 1327   BILIRUBINUR small 12/01/2014 2106   KETONESUR NEGATIVE 06/24/2016 0114   PROTEINUR 30 (A) 06/24/2016 0114   UROBILINOGEN 1.0 03/25/2016 1327   UROBILINOGEN 1.0 03/18/2015 0038   NITRITE NEGATIVE 06/24/2016 0114   LEUKOCYTESUR NEGATIVE 06/24/2016 0114   Sepsis Labs: @LABRCNTIP (procalcitonin:4,lacticidven:4)  )No results found for this or any previous visit (from the past 240 hour(s)).    Radiology Studies: Dg Abdomen Acute W/chest  Result Date: 06/24/2016 CLINICAL DATA:  Abdominal pain and vomiting. EXAM: DG ABDOMEN ACUTE W/ 1V CHEST COMPARISON:  CT 05/28/2016 FINDINGS: The abdominal gas pattern is negative for obstruction or perforation. Stool and air is present throughout the colon. Cholecystectomy clips are visible in the right upper quadrant. The upright view of the chest demonstrates mild cardiomegaly, unchanged from 02/27/2014. No acute cardiopulmonary findings. IMPRESSION: Negative abdominal radiographs.  No acute cardiopulmonary disease. Electronically Signed   By: Andreas Newport M.D.   On: 06/24/2016 01:09   Ct Angio Chest/abd/pel For Dissection W  And/or Wo Contrast  Result Date: 06/24/2016 CLINICAL DATA:  Chest and back pain EXAM: CT ANGIOGRAPHY CHEST, ABDOMEN AND PELVIS TECHNIQUE: Multidetector CT imaging through the chest, abdomen and pelvis was performed using the standard protocol during bolus administration of intravenous contrast. Multiplanar reconstructed images and MIPs were obtained and reviewed to evaluate the vascular anatomy. CONTRAST:  100 mL Isovue 370 COMPARISON:  None. FINDINGS: CTA CHEST FINDINGS Cardiovascular: Precontrast imaging shows no evidence of aortic intramural hematoma. There is a normal variant aortic arch branching pattern with the brachiocephalic and left common carotid arteries sharing a common origin. There is no aortic dissection or aneurysm. The pulmonary arteries are normal, within the limitation that the timing of this study with optimized for the aorta. Mediastinum/Nodes: No mediastinal, hilar or axillary lymphadenopathy. The visualized thyroid and thoracic esophageal course are unremarkable. Lungs/Pleura: There is bibasilar  dependent subsegmental atelectasis. Musculoskeletal: No chest wall abnormality. No acute or significant osseous findings. Review of the MIP images confirms the above findings. CTA ABDOMEN AND PELVIS FINDINGS VASCULAR Aorta: Normal caliber aorta without aneurysm, dissection, vasculitis or significant stenosis. Celiac: Patent without evidence of aneurysm, dissection, vasculitis or significant stenosis. SMA: Patent without evidence of aneurysm, dissection, vasculitis or significant stenosis. Renals: Both renal arteries are patent without evidence of aneurysm, dissection, vasculitis, fibromuscular dysplasia or significant stenosis. IMA: Patent without evidence of aneurysm, dissection, vasculitis or significant stenosis. Inflow: Patent without evidence of aneurysm, dissection, vasculitis or significant stenosis. Review of the MIP images confirms the above findings. NON-VASCULAR Hepatobiliary: Status  post cholecystectomy. There is mild hepatomegaly. No focal liver lesion. Pancreas: There is inflammatory stranding surrounding the pancreas, greatest at the body and head. No focal fluid collection. The pancreatic duct is not dilated. Spleen: Normal Adrenals/Urinary Tract: The adrenal glands are normal. Both kidneys are normal. Stomach/Bowel: No dilated small bowel or other evidence of obstruction. No enteric or colonic inflammation. Despite review of multiplanar reformatted images in 3 orthogonal planes, the appendix could not be adequately visualized. However, there is no inflammatory stranding or free fluid within the right lower quadrant. Lymphatic: There are multiple celiac reactive lymph nodes. Reproductive: Normal prostate and seminal vesicles. Other: No abdominal wall hernia or abnormality. No abdominopelvic ascites. Musculoskeletal: No acute or significant osseous findings. Review of the MIP images confirms the above findings. IMPRESSION: 1. Normal aorta without acute aortic syndrome. 2. Acute pancreatitis without focal fluid collection. Electronically Signed   By: Ulyses Jarred M.D.   On: 06/24/2016 02:26     Scheduled Meds: . doxazosin  1 mg Oral Daily  . enoxaparin (LOVENOX) injection  40 mg Subcutaneous Daily  . lisinopril  40 mg Oral Daily   And  . hydrochlorothiazide  25 mg Oral Daily  . verapamil  360 mg Oral Daily   Continuous Infusions: . sodium chloride 150 mL/hr at 06/24/16 1102     LOS: 0 days   Time Spent in minutes   30 minutes  Lisa Blakeman D.O. on 06/24/2016 at 12:44 PM  Between 7am to 7pm - Pager - [phone]  After 7pm go to [email or web address] - password [email or web address]  And look for the night coverage person covering for me after hours  [facility] Office  [phone]

## 2016-06-24 NOTE — ED Notes (Signed)
Patient transported to X-ray via stretcher 

## 2016-06-24 NOTE — ED Triage Notes (Signed)
Pt presents from home with [facility] for epigastric pain, vomiting, diaphoresis; pt reports hx of chrons disease; pt states he ate popeyes for dinner and pain began at 1015pm; VSS per EMS report; pt given 64m zofran and 1047m fentanyl en route by EMS

## 2016-06-24 NOTE — Progress Notes (Signed)
Nutrition Brief Note  Patient identified on the Malnutrition Screening Tool (MST) Report  Wt Readings from Last 15 Encounters:  06/24/16 300 lb (136.1 kg)  05/23/16 (!) 303 lb 3.2 oz (137.5 kg)  05/15/16 299 lb (135.6 kg)  04/06/16 (!) 307 lb (139.3 kg)  03/25/16 (!) 306 lb (138.8 kg)  03/10/16 299 lb 6.4 oz (135.8 kg)  06/07/15 (!) 302 lb (137 kg)  04/07/15 294 lb 3.2 oz (133.4 kg)  03/18/15 289 lb 7 oz (131.3 kg)  12/24/14 290 lb (131.5 kg)  12/22/14 295 lb (133.8 kg)  12/01/14 295 lb (133.8 kg)  08/24/14 296 lb (134.3 kg)  03/02/14 (!) 315 lb 6 oz (143.1 kg)  02/27/14 (!) 311 lb (141.1 kg)   Craig Guzman is a 45 y.o. male with a past medical history significant for Crohn's disease back on Humira, obesity and HTN who presents with acute severe epigastric pain and vomiting.  Pt admitted with acute pancreatitis.   Spoke with pt at bedside, who reports good appetite. He shares that he typically consumes small snacks throughout the day at work, due to a weak sphincter muscle (food items such as yogurt and applesauce), however reports he believes it is "getting stronger because I ate some pizza at work the other day". He consumes 1-2 large meal at home. Pt shares she has followed this meal pattern over the past 4-5 years.   He denies weight loss. He reports UBW is around 300#, which is consistent with wt hx.   Nutrition-Focused physical exam completed. Findings are no fat depletion, no muscle depletion, and no edema.   Pt understanding of NPO order. He is eager to try liquids. Discussed diet progression and principles of both full liquid and clear liquid diets.   Body mass index is 39.58 kg/m. Patient meets criteria for obesity, class II based on current BMI.   Current diet order is NPO, patient is consuming approximately n/a% of meals at this time. Labs and medications reviewed.   No nutrition interventions warranted at this time. If nutrition issues arise, please consult RD.    Nastassia Bazaldua A. Jimmye Norman, RD, LDN, CDE Pager: [phone] After hours Pager: [phone]

## 2016-06-24 NOTE — ED Notes (Signed)
Patient transported to CT 

## 2016-06-25 LAB — COMPREHENSIVE METABOLIC PANEL
ALT: 31 U/L (ref 17–63)
ANION GAP: 9 (ref 5–15)
AST: 24 U/L (ref 15–41)
Albumin: 3 g/dL — ABNORMAL LOW (ref 3.5–5.0)
Alkaline Phosphatase: 103 U/L (ref 38–126)
BUN: 9 mg/dL (ref 6–20)
CHLORIDE: 106 mmol/L (ref 101–111)
CO2: 22 mmol/L (ref 22–32)
Calcium: 8.1 mg/dL — ABNORMAL LOW (ref 8.9–10.3)
Creatinine, Ser: 1.06 mg/dL (ref 0.61–1.24)
GFR calc non Af Amer: >60 mL/min (ref >60)
Glucose, Bld: 91 mg/dL (ref 65–99)
POTASSIUM: 3.4 mmol/L — AB (ref 3.5–5.1)
SODIUM: 137 mmol/L (ref 135–145)
Total Bilirubin: 1.1 mg/dL (ref 0.3–1.2)
Total Protein: 6 g/dL — ABNORMAL LOW (ref 6.5–8.1)

## 2016-06-25 LAB — LIPASE, BLOOD: LIPASE: 100 U/L — AB (ref 11–51)

## 2016-06-25 MED ORDER — POTASSIUM CHLORIDE CRYS ER 20 MEQ PO TBCR
40.0000 meq | EXTENDED_RELEASE_TABLET | Freq: Once | ORAL | Status: AC
  Administered 2016-06-25: 40 meq via ORAL
  Filled 2016-06-25: qty 2

## 2016-06-25 MED ORDER — WHITE PETROLATUM GEL
Status: AC
  Administered 2016-06-25: 1
  Filled 2016-06-25: qty 1

## 2016-06-25 NOTE — Progress Notes (Signed)
PROGRESS NOTE    Craig Guzman  NOB:[id number] DOB: 21-Jan-1971 DOA: 06/23/2016 PCP: [name], MD   Chief Complaint  Patient presents with  . Abdominal Pain    hx chrons     Brief Narrative:  HPI on 06/24/2016 by Dr. Myrene Buddy Craig Guzman is a 45 y.o. male with a past medical history significant for Crohn's disease back on Humira, obesity and HTN who presents with acute severe epigastric pain and vomiting.  She was in his usual state of health until tonight a few hours after eating when he had onset of epigastric pain, they crescendoed to severe abdominal pain radiating to the back associated with unrelenting nausea, vomiting and diaphoresis, so he came to the ER.  Assessment & Plan   Acute pancreatitis -Unknown etiology, possibly HCTZ  -CTA chest,abd/pelvis: normal aorta, acute pancreatitis without focal fluid collection  -Denies alcohol use, had cholecystectomy -uses HCTZ- discontinued -Recently started Humira -LFTs: AST 42, Alk phos 117 -Triglycerides 77 -Lipase upon admission 3440, down to 100 today -Lactic acid normal -Continue IVF, pain control and antiemetics PRN -Will place on liquid diet today   Crohn's disease -No active symptoms.  HTN -Continue doxazosin, verapamil, lisinopril -Discontinued HCTZ  Tobacco use -Has been intolerant to Chantix and nicotine patches in the past.  DVT Prophylaxis  Lovenox  Code Status: Full  Family Communication: Wife at bedside  Disposition Plan: Admitted. Possible discharge within 24 hrs to home.  Consultants None  Procedures  None  Antibiotics   Anti-infectives    None      Subjective:   Craig Guzman seen and examined today.  Patient no longer complains of nausea or abdominal pain.  Feels hungry.  Denies chest pain, shortness of breath, dizziness or headache.   Objective:   Vitals:   06/24/16 1358 06/24/16 2142 06/24/16 2146 06/25/16 0513  BP: 140/83 (!) 153/63 (!) 149/70 (!)  143/77  Pulse: (!) 55 64  74  Resp: 18 18  17   Temp: 98.1 F (36.7 C) 99.2 F (37.3 C)  99.4 F (37.4 C)  TempSrc: Oral Oral  Oral  SpO2: 91% 93%  97%  Weight:      Height:        Intake/Output Summary (Last 24 hours) at 06/25/16 1235 Last data filed at 06/25/16 0516  Gross per 24 hour  Intake             1371 ml  Output             1225 ml  Net              146 ml   Filed Weights   06/24/16 0002 06/24/16 0429  Weight: 136.1 kg (300 lb) 136.1 kg (300 lb)    Exam  General: Well developed, well nourished, NAD, appears stated age  2: NCAT, mucous membranes moist.   Cardiovascular: S1 S2 auscultated, no murmurs, RRR  Respiratory: Clear to auscultation bilaterally with equal chest rise  Abdomen: Soft, obese, nontender, nondistended, + bowel sounds  Extremities: warm dry without cyanosis clubbing or edema  Neuro: AAOx3, nonfocal  Psych: Normal affect and demeanor, pleasant   Data Reviewed: I have personally reviewed following labs and imaging studies  CBC:  Recent Labs Lab 06/24/16 0016 06/24/16 0540  WBC 8.9 9.7  NEUTROABS 5.0  --   HGB 15.3 14.2  HCT 45.1 42.1  MCV 85.1 83.7  PLT 161 947   Basic Metabolic Panel:  Recent Labs Lab 06/24/16 0016  06/24/16 0540 06/25/16 0518  NA 138 141 137  K 3.6 3.9 3.4*  CL 107 109 106  CO2 21* 25 22  GLUCOSE 142* 101* 91  BUN 10 9 9   CREATININE 1.17 1.09 1.06  CALCIUM 8.6* 8.4* 8.1*   GFR: Estimated Creatinine Clearance: 128.8 mL/min (by C-G formula based on SCr of 1.06 mg/dL). Liver Function Tests:  Recent Labs Lab 06/24/16 0016 06/24/16 0540 06/25/16 0518  AST 56* 42* 24  ALT 44 42 31  ALKPHOS 117 117 103  BILITOT 0.8 0.7 1.1  PROT 7.0 6.9 6.0*  ALBUMIN 3.6 3.5 3.0*    Recent Labs Lab 06/24/16 0016 06/25/16 0518  LIPASE 3,440* 100*   No results for input(s): AMMONIA in the last 168 hours. Coagulation Profile: No results for input(s): INR, PROTIME in the last 168 hours. Cardiac  Enzymes:  Recent Labs Lab 06/24/16 0016  TROPONINI <0.03   BNP (last 3 results) No results for input(s): PROBNP in the last 8760 hours. HbA1C: No results for input(s): HGBA1C in the last 72 hours. CBG: No results for input(s): GLUCAP in the last 168 hours. Lipid Profile:  Recent Labs  06/24/16 0540  CHOL 152  HDL 31*  LDLCALC 106*  TRIG 77  CHOLHDL 4.9   Thyroid Function Tests: No results for input(s): TSH, T4TOTAL, FREET4, T3FREE, THYROIDAB in the last 72 hours. Anemia Panel: No results for input(s): VITAMINB12, FOLATE, FERRITIN, TIBC, IRON, RETICCTPCT in the last 72 hours. Urine analysis:    Component Value Date/Time   COLORURINE YELLOW 06/24/2016 0114   APPEARANCEUR CLEAR 06/24/2016 0114   LABSPEC 1.011 06/24/2016 0114   PHURINE 7.0 06/24/2016 0114   GLUCOSEU NEGATIVE 06/24/2016 0114   HGBUR NEGATIVE 06/24/2016 0114   BILIRUBINUR NEGATIVE 06/24/2016 0114   BILIRUBINUR small (A) 03/25/2016 1327   BILIRUBINUR small 12/01/2014 2106   KETONESUR NEGATIVE 06/24/2016 0114   PROTEINUR 30 (A) 06/24/2016 0114   UROBILINOGEN 1.0 03/25/2016 1327   UROBILINOGEN 1.0 03/18/2015 0038   NITRITE NEGATIVE 06/24/2016 0114   LEUKOCYTESUR NEGATIVE 06/24/2016 0114   Sepsis Labs: @LABRCNTIP (procalcitonin:4,lacticidven:4)  )No results found for this or any previous visit (from the past 240 hour(s)).    Radiology Studies: Dg Abdomen Acute W/chest  Result Date: 06/24/2016 CLINICAL DATA:  Abdominal pain and vomiting. EXAM: DG ABDOMEN ACUTE W/ 1V CHEST COMPARISON:  CT 05/28/2016 FINDINGS: The abdominal gas pattern is negative for obstruction or perforation. Stool and air is present throughout the colon. Cholecystectomy clips are visible in the right upper quadrant. The upright view of the chest demonstrates mild cardiomegaly, unchanged from 02/27/2014. No acute cardiopulmonary findings. IMPRESSION: Negative abdominal radiographs.  No acute cardiopulmonary disease. Electronically Signed    By: Andreas Newport M.D.   On: 06/24/2016 01:09   Ct Angio Chest/abd/pel For Dissection W And/or Wo Contrast  Result Date: 06/24/2016 CLINICAL DATA:  Chest and back pain EXAM: CT ANGIOGRAPHY CHEST, ABDOMEN AND PELVIS TECHNIQUE: Multidetector CT imaging through the chest, abdomen and pelvis was performed using the standard protocol during bolus administration of intravenous contrast. Multiplanar reconstructed images and MIPs were obtained and reviewed to evaluate the vascular anatomy. CONTRAST:  100 mL Isovue 370 COMPARISON:  None. FINDINGS: CTA CHEST FINDINGS Cardiovascular: Precontrast imaging shows no evidence of aortic intramural hematoma. There is a normal variant aortic arch branching pattern with the brachiocephalic and left common carotid arteries sharing a common origin. There is no aortic dissection or aneurysm. The pulmonary arteries are normal, within the limitation that the timing of this  study with optimized for the aorta. Mediastinum/Nodes: No mediastinal, hilar or axillary lymphadenopathy. The visualized thyroid and thoracic esophageal course are unremarkable. Lungs/Pleura: There is bibasilar dependent subsegmental atelectasis. Musculoskeletal: No chest wall abnormality. No acute or significant osseous findings. Review of the MIP images confirms the above findings. CTA ABDOMEN AND PELVIS FINDINGS VASCULAR Aorta: Normal caliber aorta without aneurysm, dissection, vasculitis or significant stenosis. Celiac: Patent without evidence of aneurysm, dissection, vasculitis or significant stenosis. SMA: Patent without evidence of aneurysm, dissection, vasculitis or significant stenosis. Renals: Both renal arteries are patent without evidence of aneurysm, dissection, vasculitis, fibromuscular dysplasia or significant stenosis. IMA: Patent without evidence of aneurysm, dissection, vasculitis or significant stenosis. Inflow: Patent without evidence of aneurysm, dissection, vasculitis or significant  stenosis. Review of the MIP images confirms the above findings. NON-VASCULAR Hepatobiliary: Status post cholecystectomy. There is mild hepatomegaly. No focal liver lesion. Pancreas: There is inflammatory stranding surrounding the pancreas, greatest at the body and head. No focal fluid collection. The pancreatic duct is not dilated. Spleen: Normal Adrenals/Urinary Tract: The adrenal glands are normal. Both kidneys are normal. Stomach/Bowel: No dilated small bowel or other evidence of obstruction. No enteric or colonic inflammation. Despite review of multiplanar reformatted images in 3 orthogonal planes, the appendix could not be adequately visualized. However, there is no inflammatory stranding or free fluid within the right lower quadrant. Lymphatic: There are multiple celiac reactive lymph nodes. Reproductive: Normal prostate and seminal vesicles. Other: No abdominal wall hernia or abnormality. No abdominopelvic ascites. Musculoskeletal: No acute or significant osseous findings. Review of the MIP images confirms the above findings. IMPRESSION: 1. Normal aorta without acute aortic syndrome. 2. Acute pancreatitis without focal fluid collection. Electronically Signed   By: Ulyses Jarred M.D.   On: 06/24/2016 02:26     Scheduled Meds: . doxazosin  1 mg Oral Daily  . enoxaparin (LOVENOX) injection  40 mg Subcutaneous Daily  . lisinopril  40 mg Oral Daily  . verapamil  360 mg Oral Daily   Continuous Infusions: . sodium chloride 150 mL/hr at 06/25/16 0549     LOS: 1 day   Time Spent in minutes   30 minutes  MIKHAIL, MARYANN D.O. on 06/25/2016 at 12:35 PM  Between 7am to 7pm - Pager - [phone]  After 7pm go to [email or web address] - password [email or web address]  And look for the night coverage person covering for me after hours  [facility] Office  [phone]

## 2016-06-26 DIAGNOSIS — Z720 Tobacco use: Secondary | ICD-10-CM

## 2016-06-26 DIAGNOSIS — E876 Hypokalemia: Secondary | ICD-10-CM

## 2016-06-26 LAB — BASIC METABOLIC PANEL
ANION GAP: 5 (ref 5–15)
BUN: 5 mg/dL — ABNORMAL LOW (ref 6–20)
CO2: 27 mmol/L (ref 22–32)
Calcium: 8.2 mg/dL — ABNORMAL LOW (ref 8.9–10.3)
Chloride: 105 mmol/L (ref 101–111)
Creatinine, Ser: 1.02 mg/dL (ref 0.61–1.24)
GFR calc non Af Amer: >60 mL/min (ref >60)
GLUCOSE: 91 mg/dL (ref 65–99)
POTASSIUM: 3.4 mmol/L — AB (ref 3.5–5.1)
Sodium: 137 mmol/L (ref 135–145)

## 2016-06-26 LAB — CBC
HEMATOCRIT: 38.2 % — AB (ref 39.0–52.0)
HEMOGLOBIN: 13 g/dL (ref 13.0–17.0)
MCH: 29.1 pg (ref 26.0–34.0)
MCHC: 34 g/dL (ref 30.0–36.0)
MCV: 85.7 fL (ref 78.0–100.0)
Platelets: 122 10*3/uL — ABNORMAL LOW (ref 150–400)
RBC: 4.46 MIL/uL (ref 4.22–5.81)
RDW: 14.3 % (ref 11.5–15.5)
WBC: 6.5 10*3/uL (ref 4.0–10.5)

## 2016-06-26 LAB — MAGNESIUM: Magnesium: 1.8 mg/dL (ref 1.7–2.4)

## 2016-06-26 MED ORDER — POTASSIUM CHLORIDE CRYS ER 20 MEQ PO TBCR
40.0000 meq | EXTENDED_RELEASE_TABLET | Freq: Once | ORAL | Status: AC
  Administered 2016-06-26: 40 meq via ORAL
  Filled 2016-06-26: qty 2

## 2016-06-26 MED ORDER — MAGNESIUM SULFATE 2 GM/50ML IV SOLN
2.0000 g | Freq: Once | INTRAVENOUS | Status: AC
  Administered 2016-06-26: 2 g via INTRAVENOUS
  Filled 2016-06-26: qty 50

## 2016-06-26 MED ORDER — LISINOPRIL 40 MG PO TABS: 40.0000 mg | ORAL_TABLET | Freq: Every day | ORAL | 0 refills | Status: DC

## 2016-06-26 NOTE — Discharge Summary (Signed)
Physician Discharge Summary  Craig Guzman KDT:[id number] DOB: [birth year] DOA: 06/23/2016  PCP: [name], MD  Admit date: 06/23/2016 Discharge date: 06/26/2016  Time spent: 45 minutes  Recommendations for Outpatient Follow-up:  Patient will be discharged to home.  Patient will need to follow up with primary care provider within one week of discharge, repeat BMP.  Follow up with [name], gastroenterology.  Patient should continue medications as prescribed.  Patient should follow a soft diet.   Discharge Diagnoses:  Acute pancreatitis Crohn's disease HTN Tobacco use Hypokalemia  Discharge Condition: Stable  Diet recommendation: soft  Filed Weights   06/24/16 0002 06/24/16 0429  Weight: 136.1 kg (300 lb) 136.1 kg (300 lb)    History of present illness:  on 06/24/2016 by Dr. Jeral Fruit Jris a 45 y.o.malewith a past medical history significant for Crohn's disease back on Humira, obesity and HTNwho presents with acute severe epigastric pain and vomiting.  She was in his usual state of health until tonight a few hours after eating when he had onset of epigastric pain, they crescendoed to severe abdominal pain radiating to the back associated with unrelenting nausea, vomiting and diaphoresis, so he came to the ER.  Hospital Course:  Acute pancreatitis -Unknown etiology, possibly HCTZ  -CTA chest,abd/pelvis: normal aorta, acute pancreatitis without focal fluid collection  -Denies alcohol use, had cholecystectomy -uses HCTZ- discontinued -Recently started Humira -LFTs: AST 42, Alk phos 117 -Triglycerides 77 -Lipase upon admission 3440, down to 100 today -Lactic acid normal -Continue IVF, pain control and antiemetics PRN -Tolerated diet.  -Patient improved quickly. Will need to follow up with [name], GI, in 1-2 weeks.  Crohn's disease -No active symptoms.  HTN -Continue doxazosin, verapamil, lisinopril -Discontinued  HCTZ  Tobacco use -Has been intolerant to Chantix and nicotine patches in the past.  Hypokalemia -Repleteing. Magnesium 1.8- replacing -Repeat BMP in one week  Procedures:  none  Consultations:  none  Discharge Exam: Vitals:   06/26/16 0522 06/26/16 0936  BP: (!) 162/69 (!) 190/100  Pulse:  (!) 59  Resp:    Temp:     Exam  General: Well developed, well nourished, NAD, appears stated age  HEENT: NCAT, mucous membranes moist.   Cardiovascular: S1 S2 auscultated, no murmurs, RRR  Respiratory: Clear to auscultation bilaterally with equal chest rise  Abdomen: Soft, obese, nontender, nondistended, + bowel sounds  Extremities: warm dry without cyanosis clubbing or edema  Neuro: AAOx3, nonfocal  Psych: Normal affect and demeanor, pleasant  Discharge Instructions Discharge Instructions    Discharge instructions    Complete by:  As directed    Patient will be discharged to home.  Patient will need to follow up with primary care provider within one week of discharge, repeat BMP.  Follow up with [name], gastroenterology.  Patient should continue medications as prescribed.  Patient should follow a soft diet.     Current Discharge Medication List    START taking these medications   Details  lisinopril (PRINIVIL,ZESTRIL) 40 MG tablet Take 1 tablet (40 mg total) by mouth daily. Qty: 30 tablet, Refills: 0      CONTINUE these medications which have NOT CHANGED   Details  !! Adalimumab (HUMIRA PEN-CROHNS STARTER) 40 MG/0.8ML PNKT Inject 160 mg into the skin every 14 (fourteen) days. Inject 160 mg into skin on day 0, 80 mg into skin on day 14 Qty: 1 each, Refills: kit    cyclobenzaprine (FLEXERIL) 10 MG tablet Take 10 mg by mouth  3 (three) times daily as needed (for migraines).    doxazosin (CARDURA) 1 MG tablet Take 1 tablet (1 mg total) by mouth daily. Qty: 30 tablet, Refills: 0   Associated Diagnoses: Essential hypertension; Nocturia     HYDROcodone-acetaminophen (NORCO/VICODIN) 5-325 MG tablet Take 1-2 tablets by mouth every 6 (six) hours as needed. Qty: 10 tablet, Refills: 0    omeprazole-sodium bicarbonate (ZEGERID) 40-1100 MG capsule Take 1 capsule by mouth daily with supper.    prochlorperazine (COMPAZINE) 25 MG suppository Place 25 mg rectally every 12 (twelve) hours as needed for nausea or vomiting.    rizatriptan (MAXALT-MLT) 10 MG disintegrating tablet Take 1 tablet (10 mg total) by mouth 3 (three) times daily as needed for migraine. Qty: 9 tablet, Refills: 3    verapamil (CALAN-SR) 180 MG CR tablet Take 360 mg by mouth daily.    !! Adalimumab (HUMIRA PEN) 40 MG/0.8ML PNKT Inject 40 mg into the skin every 14 (fourteen) days. After completion of the starter Qty: 2 each, Refills: 6     !! - Potential duplicate medications found. Please discuss with provider.    STOP taking these medications     lisinopril-hydrochlorothiazide (PRINZIDE,ZESTORETIC) 20-12.5 MG tablet        Allergies  Allergen Reactions  . Zithromax [Azithromycin] Hypertension   Follow-up Information    [name], MD. Schedule an appointment as soon as possible for a visit in 1 week(s).   Specialties:  Family Medicine, Sports Medicine Why:  Hospital follow up, repeat potassium Contact information: [place] [address] [phone]        Silvano Rusk, MD. Schedule an appointment as soon as possible for a visit in 1 week(s).   Specialty:  Gastroenterology Why:  Hospital followup, pancreatitis  Contact information: [address] [place] [address] [phone]            The results of significant diagnostics from this hospitalization (including imaging, microbiology, ancillary and laboratory) are listed below for reference.    Significant Diagnostic Studies: Dg Abdomen Acute W/chest  Result Date: 06/24/2016 CLINICAL DATA:  Abdominal pain and vomiting. EXAM: DG ABDOMEN ACUTE W/ 1V CHEST  COMPARISON:  CT 05/28/2016 FINDINGS: The abdominal gas pattern is negative for obstruction or perforation. Stool and air is present throughout the colon. Cholecystectomy clips are visible in the right upper quadrant. The upright view of the chest demonstrates mild cardiomegaly, unchanged from 02/27/2014. No acute cardiopulmonary findings. IMPRESSION: Negative abdominal radiographs.  No acute cardiopulmonary disease. Electronically Signed   By: Andreas Newport M.D.   On: 06/24/2016 01:09   Ct Renal Stone Study  Result Date: 05/28/2016 CLINICAL DATA:  Right flank pain. Passed 2 kidney stones last week. Pain is now back and worse. EXAM: CT ABDOMEN AND PELVIS WITHOUT CONTRAST TECHNIQUE: Multidetector CT imaging of the abdomen and pelvis was performed following the standard protocol without IV contrast. COMPARISON:  02/11/2015 FINDINGS: Lower chest: The lung bases are clear. Hepatobiliary: No focal liver abnormality is seen. Status post cholecystectomy. No biliary dilatation. Pancreas: Unremarkable. No pancreatic ductal dilatation or surrounding inflammatory changes. Spleen: Change line Adrenals/Urinary Tract: No adrenal gland nodules. Kidneys are symmetrical in size and shape. No hydronephrosis or hydroureter. No renal, ureteral, or bladder stones identified. Bladder wall is not thickened. Stomach/Bowel: Stomach is within normal limits. Appendix appears normal. No evidence of bowel wall thickening, distention, or inflammatory changes. Vascular/Lymphatic: No significant vascular findings are present. No enlarged abdominal or pelvic lymph nodes. Reproductive: Prostate gland is enlarged,  measuring 4.7 cm. Other: No abdominal wall hernia or abnormality. No abdominopelvic ascites. Musculoskeletal: Mild degenerative changes in the spine. No destructive bone lesions. IMPRESSION: No renal or ureteral stone or obstruction. No acute process demonstrated in the abdomen or pelvis. Prostate is enlarged. Electronically Signed    By: Lucienne Capers M.D.   On: 05/28/2016 21:11   Ct Angio Chest/abd/pel For Dissection W And/or Wo Contrast  Result Date: 06/24/2016 CLINICAL DATA:  Chest and back pain EXAM: CT ANGIOGRAPHY CHEST, ABDOMEN AND PELVIS TECHNIQUE: Multidetector CT imaging through the chest, abdomen and pelvis was performed using the standard protocol during bolus administration of intravenous contrast. Multiplanar reconstructed images and MIPs were obtained and reviewed to evaluate the vascular anatomy. CONTRAST:  100 mL Isovue 370 COMPARISON:  None. FINDINGS: CTA CHEST FINDINGS Cardiovascular: Precontrast imaging shows no evidence of aortic intramural hematoma. There is a normal variant aortic arch branching pattern with the brachiocephalic and left common carotid arteries sharing a common origin. There is no aortic dissection or aneurysm. The pulmonary arteries are normal, within the limitation that the timing of this study with optimized for the aorta. Mediastinum/Nodes: No mediastinal, hilar or axillary lymphadenopathy. The visualized thyroid and thoracic esophageal course are unremarkable. Lungs/Pleura: There is bibasilar dependent subsegmental atelectasis. Musculoskeletal: No chest wall abnormality. No acute or significant osseous findings. Review of the MIP images confirms the above findings. CTA ABDOMEN AND PELVIS FINDINGS VASCULAR Aorta: Normal caliber aorta without aneurysm, dissection, vasculitis or significant stenosis. Celiac: Patent without evidence of aneurysm, dissection, vasculitis or significant stenosis. SMA: Patent without evidence of aneurysm, dissection, vasculitis or significant stenosis. Renals: Both renal arteries are patent without evidence of aneurysm, dissection, vasculitis, fibromuscular dysplasia or significant stenosis. IMA: Patent without evidence of aneurysm, dissection, vasculitis or significant stenosis. Inflow: Patent without evidence of aneurysm, dissection, vasculitis or significant  stenosis. Review of the MIP images confirms the above findings. NON-VASCULAR Hepatobiliary: Status post cholecystectomy. There is mild hepatomegaly. No focal liver lesion. Pancreas: There is inflammatory stranding surrounding the pancreas, greatest at the body and head. No focal fluid collection. The pancreatic duct is not dilated. Spleen: Normal Adrenals/Urinary Tract: The adrenal glands are normal. Both kidneys are normal. Stomach/Bowel: No dilated small bowel or other evidence of obstruction. No enteric or colonic inflammation. Despite review of multiplanar reformatted images in 3 orthogonal planes, the appendix could not be adequately visualized. However, there is no inflammatory stranding or free fluid within the right lower quadrant. Lymphatic: There are multiple celiac reactive lymph nodes. Reproductive: Normal prostate and seminal vesicles. Other: No abdominal wall hernia or abnormality. No abdominopelvic ascites. Musculoskeletal: No acute or significant osseous findings. Review of the MIP images confirms the above findings. IMPRESSION: 1. Normal aorta without acute aortic syndrome. 2. Acute pancreatitis without focal fluid collection. Electronically Signed   By: Ulyses Jarred M.D.   On: 06/24/2016 02:26    Microbiology: No results found for this or any previous visit (from the past 240 hour(s)).   Labs: Basic Metabolic Panel:  Recent Labs Lab 06/24/16 0016 06/24/16 0540 06/25/16 0518 06/26/16 0411 06/26/16 0504  NA 138 141 137 137  --   K 3.6 3.9 3.4* 3.4*  --   CL 107 109 106 105  --   CO2 21* 25 22 27   --   GLUCOSE 142* 101* 91 91  --   BUN 10 9 9  5*  --   CREATININE 1.17 1.09 1.06 1.02  --   CALCIUM 8.6* 8.4* 8.1* 8.2*  --  MG  --   --   --   --  1.8   Liver Function Tests:  Recent Labs Lab 06/24/16 0016 06/24/16 0540 06/25/16 0518  AST 56* 42* 24  ALT 44 42 31  ALKPHOS 117 117 103  BILITOT 0.8 0.7 1.1  PROT 7.0 6.9 6.0*  ALBUMIN 3.6 3.5 3.0*    Recent Labs Lab  06/24/16 0016 06/25/16 0518  LIPASE 3,440* 100*   No results for input(s): AMMONIA in the last 168 hours. CBC:  Recent Labs Lab 06/24/16 0016 06/24/16 0540 06/26/16 0411  WBC 8.9 9.7 6.5  NEUTROABS 5.0  --   --   HGB 15.3 14.2 13.0  HCT 45.1 42.1 38.2*  MCV 85.1 83.7 85.7  PLT 161 171 122*   Cardiac Enzymes:  Recent Labs Lab 06/24/16 0016  TROPONINI <0.03   BNP: BNP (last 3 results) No results for input(s): BNP in the last 8760 hours.  ProBNP (last 3 results) No results for input(s): PROBNP in the last 8760 hours.  CBG: No results for input(s): GLUCAP in the last 168 hours.     SignedCristal Ford  [facility] 06/26/2016, 10:06 AM

## 2016-06-26 NOTE — Progress Notes (Signed)
Pt discharged to home.  Discharge instructions explained to pt. Pt has no questions at the time of discharge.  IV removed.  Pt states he has all belongings.  Pt taken off unit via wheelchair by staff.

## 2016-06-26 NOTE — Discharge Instructions (Signed)
Acute Pancreatitis Introduction Acute pancreatitis is a condition in which the pancreas suddenly gets irritated and swollen (has inflammation). The pancreas is a large gland behind the stomach. It makes enzymes that help to digest food. The pancreas also makes hormones that help to control your blood sugar. Acute pancreatitis happens when the enzymes attack the pancreas and damage it. Most attacks last a couple of days and can cause serious problems. Follow these instructions at home: Eating and drinking  Follow instructions from your doctor about diet. You may need to:  Avoid alcohol.  Limit how much fat is in your diet.  Eat small meals often. Avoid eating big meals.  Drink enough fluid to keep your pee (urine) clear or pale yellow.  Do not drink alcohol if it caused your condition. General instructions  Take over-the-counter and prescription medicines only as told by your doctor.  Do not use any tobacco products. These include cigarettes, chewing tobacco, and e-cigarettes. If you need help quitting, ask your doctor.  Get plenty of rest.  If directed, check your blood sugar at home as told by your doctor.  Keep all follow-up visits as told by your doctor. This is important. Contact a doctor if:  You do not get better as quickly as expected.  You have new symptoms.  Your symptoms get worse.  You have lasting pain or weakness.  You continue to feel sick to your stomach (nauseous).  You get better and then you have another pain attack.  You have a fever. Get help right away if:  You cannot eat or keep fluids down.  Your pain becomes very bad.  Your skin or the white part of your eyes turns yellow (jaundice).  You throw up (vomit).  You feel dizzy or you pass out (faint).  Your blood sugar is high (over 300 mg/dL). This information is not intended to replace advice given to you by your health care provider. Make sure you discuss any questions you have with your  health care provider. Document Released: 12/06/2007 Document Revised: 11/25/2015 Document Reviewed: 03/23/2015  2017 [employer]

## 2016-07-04 ENCOUNTER — Ambulatory Visit (INDEPENDENT_AMBULATORY_CARE_PROVIDER_SITE_OTHER): Payer: Managed Care, Other (non HMO) | Admitting: Family Medicine

## 2016-07-04 VITALS — BP 144/90 | HR 59 | Temp 98.0°F | Resp 18 | Ht 73.0 in | Wt 301.0 lb

## 2016-07-04 DIAGNOSIS — Z720 Tobacco use: Secondary | ICD-10-CM | POA: Diagnosis not present

## 2016-07-04 DIAGNOSIS — K859 Acute pancreatitis without necrosis or infection, unspecified: Secondary | ICD-10-CM | POA: Diagnosis not present

## 2016-07-04 DIAGNOSIS — I10 Essential (primary) hypertension: Secondary | ICD-10-CM | POA: Diagnosis not present

## 2016-07-04 MED ORDER — LISINOPRIL 40 MG PO TABS: 40.0000 mg | ORAL_TABLET | Freq: Every day | ORAL | 1 refills | Status: DC

## 2016-07-04 MED ORDER — VERAPAMIL HCL ER 180 MG PO TBCR: 360.0000 mg | EXTENDED_RELEASE_TABLET | Freq: Every day | ORAL | 1 refills | Status: DC

## 2016-07-04 MED ORDER — DOXAZOSIN MESYLATE 1 MG PO TABS: 1.0000 mg | ORAL_TABLET | Freq: Every day | ORAL | 1 refills | Status: DC

## 2016-07-04 NOTE — Patient Instructions (Addendum)
Call [name] to set up follow-up appointment in next 2 weeks if possible as recommended at hospital discharge for pancreatitis.   I will check your lipase again today, but if any return of abdominal pain, nausea or vomiting, return here or ER for evaluation. Soft diet was recommended at hospital discharge, can advance diet slowly.    Monitor your blood pressures outside of the office, and if they remain over 140/90 can increase to 2 mg of doxazosin at night (2 pills). If you are having lightheadedness, dizziness or new side effects of that medication, or blood pressure remains elevated, return to discuss other options. Let me know if you do end up needing the higher dose as you will need refills sooner. Otherwise follow-up in 6 months for a physical.  Keep up the good work with quitting smoking. Let me know if I can help with any resources. [place] offers smoking cessation clinics. Registration is required. To register call [phone] or register online at [email or web address]    Steps to Quit Smoking Smoking tobacco can be bad for your health. It can also affect almost every organ in your body. Smoking puts you and people around you at risk for many serious long-lasting (chronic) diseases. Quitting smoking is hard, but it is one of the best things that you can do for your health. It is never too late to quit. What are the benefits of quitting smoking? When you quit smoking, you lower your risk for getting serious diseases and conditions. They can include:  Lung cancer or lung disease.  Heart disease.  Stroke.  Heart attack.  Not being able to have children (infertility).  Weak bones (osteoporosis) and broken bones (fractures). If you have coughing, wheezing, and shortness of breath, those symptoms may get better when you quit. You may also get sick less often. If you are pregnant, quitting smoking can help to lower your chances of having a baby of low birth weight. What can I do to  help me quit smoking? Talk with your doctor about what can help you quit smoking. Some things you can do (strategies) include:  Quitting smoking totally, instead of slowly cutting back how much you smoke over a period of time.  Going to in-person counseling. You are more likely to quit if you go to many counseling sessions.  Using resources and support systems, such as:  Online chats with a Social worker.  Phone quitlines.  Printed Furniture conservator/restorer.  Support groups or group counseling.  Text messaging programs.  Mobile phone apps or applications.  Taking medicines. Some of these medicines may have nicotine in them. If you are pregnant or breastfeeding, do not take any medicines to quit smoking unless your doctor says it is okay. Talk with your doctor about counseling or other things that can help you. Talk with your doctor about using more than one strategy at the same time, such as taking medicines while you are also going to in-person counseling. This can help make quitting easier. What things can I do to make it easier to quit? Quitting smoking might feel very hard at first, but there is a lot that you can do to make it easier. Take these steps:  Talk to your family and friends. Ask them to support and encourage you.  Call phone quitlines, reach out to support groups, or work with a Social worker.  Ask people who smoke to not smoke around you.  Avoid places that make you want (trigger) to smoke, such as:  Bars.  Parties.  Smoke-break areas at work.  Spend time with people who do not smoke.  Lower the stress in your life. Stress can make you want to smoke. Try these things to help your stress:  Getting regular exercise.  Deep-breathing exercises.  Yoga.  Meditating.  Doing a body scan. To do this, close your eyes, focus on one area of your body at a time from head to toe, and notice which parts of your body are tense. Try to relax the muscles in those  areas.  Download or buy apps on your mobile phone or tablet that can help you stick to your quit plan. There are many free apps, such as QuitGuide from the [employer] Office manager for Disease Control and Prevention). You can find more support from smokefree.gov and other websites. This information is not intended to replace advice given to you by your health care provider. Make sure you discuss any questions you have with your health care provider. Document Released: 04/15/2009 Document Revised: 02/15/2016 Document Reviewed: 11/03/2014 Elsevier Interactive Patient Education  2017 [employer].    IF you received an x-ray today, you will receive an invoice from [facility]. Please contact [facility] at [phone] with questions or concerns regarding your invoice.   IF you received labwork today, you will receive an invoice from [facility]. Please contact [facility] at [phone] with questions or concerns regarding your invoice.   Our billing staff will not be able to assist you with questions regarding bills from these companies.  You will be contacted with the lab results as soon as they are available. The fastest way to get your results is to activate your My Chart account. Instructions are located on the last page of this paperwork. If you have not heard from [place] regarding the results in 2 weeks, please contact this office.

## 2016-07-04 NOTE — Progress Notes (Signed)
Subjective:  By signing my name below, I, Moises Blood, attest that this documentation has been prepared under the direction and in the presence of Merri Ray, MD. Electronically Signed: Moises Blood, [place]. 07/04/2016 , 7:26 PM .  Patient was seen in Room 10 .   Patient ID: Craig Guzman, male    DOB: [birth year], 46 y.o.   MRN: [id number] Chief Complaint  Patient presents with  . Hospitalization Follow-up    pancreas issues  . Medication Problem    change Doxazosin because insurance wont cover it   HPI Craig Guzman is a 46 y.o. male Here for hospitalization follow up.   Pancreatitis He was admitted on 06/23/16 and discharged on 06/26/16 with acute pancreatitis of unknown etiology with h/o cholecystectomy, possibly caused by HCTZ. His lipase was elevated at 3340 and down to 100 at discharge and normal lactate, improved quickly. Plan for follow up with [name] in 2 weeks. HCTZ was discontinued and continued on Doxazosin 4m qd, verapamil total 3614mqd, and changed to lisinopril 4011md.   He denies any myalgia, nausea, abdominal cramping, or vomiting. He's been eating yogurt and jello during the day to increase his metabolism.   HTN Medications changed as above. His potassium was borderline low at 3.4 but normal creatinine at hospital discharge. He is requesting change of doxazosin due to insurance coverage. His last visit with me was in Sept 23rd with BP 160/110 at that time weight of 306, down to 301 today. He was started on doxazosin at that visit with plan for follow up in 2 weeks. He denies chest pain, chest tightness, headache, lightheadedness or dizziness. [place] He's been checking his BP at home, ranging from between 120-150 / 95-96.  He had a BP reading in the hospital that was 117/72.   Exercise He denies doing much exercise; however, he was moved to a new department for work. It has 13 flights of stairs over 5 stories, and he  goes up and down the stairs about 2~3 times a day.   Wt Readings from Last 3 Encounters:  07/04/16 (!) 301 lb (136.5 kg)  06/24/16 300 lb (136.1 kg)  05/23/16 (!) 303 lb 3.2 oz (137.5 kg)   OSA He had home sleep study done by Dr. DohBrett Fairye informed that he stopped breathing about 8 times an hour.   After using his cpap machine, it also decreased his nocturia because he doesn't drink water for dry mouth between waking up.   Tobacco Abuse He's been tapering back on his cigarettes since his hospitalization. He was smoking about a pack a day prior to his hospitalization. He's tried gum in the past but he would swallow it. He's also tried vaping but that didn't work. He also tried Chantix, but it caused him to "feel weird".   Patient Active Problem List   Diagnosis Date Noted  . Acute pancreatitis 06/24/2016  . Cigarette nicotine dependence without complication 11/68/37/2902 Shifting sleep-work schedule, affecting sleep 05/15/2016  . Sleep deprivation 05/15/2016  . Sleep related hypoventilation in conditions classified elsewhere 05/15/2016  . Sleep related headaches 05/15/2016  . Morbid obesity (HC[facility]amms3/20/2015  . Diarrhea 07/10/2013  . 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 Hypertension 11/26/2012  . Gastroparesis??? 12/08/2011  . Common migraine without aura 12/08/2011  . Long-term use of immunosuppressant medication- Cimzia 08/08/2011  . Crohn's disease of stomach and colon-suspected 05/17/2011  . GERD (gastroesophageal reflux disease) 04/21/2011  Past Medical History:  Diagnosis Date  . Allergy    trees/ grasses/ animals/dust/mold  . Anxiety   . Biliary dyskinesia   . Colloid thyroid nodule   . Crohn's disease ([place])    Stomach, terminal ileum, cecum  . Gastric ulcer    antral  . GERD (gastroesophageal reflux disease)   . History of migraine headaches   . HTN (hypertension)   . Kidney stone 09/20/2012  . Migraine   . Obesity   . Pneumonia 02/27/2014   ED   Past  Surgical History:  Procedure Laterality Date  . CHOLECYSTECTOMY  2011   [place]  . COLONOSCOPY  07/26/11   Crohn's colitis, ileitis  . ESOPHAGOGASTRODUODENOSCOPY  05/04/11, 07/26/11   granulomatous gastritis - Crohn's  . FLEXIBLE SIGMOIDOSCOPY    . JOINT REPLACEMENT    . MULTIPLE TOOTH EXTRACTIONS  2005  . SHOULDER SURGERY     right  . VASECTOMY     bilateral w/lysis of penile adhesions   Allergies  Allergen Reactions  . Zithromax [Azithromycin] Hypertension   Prior to Admission medications   Medication Sig Start Date End Date Taking? Authorizing Provider  Adalimumab (HUMIRA PEN) 40 MG/0.8ML PNKT Inject 40 mg into the skin every 14 (fourteen) days. After completion of the starter 06/06/16  Yes Gatha Mayer, MD  Adalimumab [facility] PEN-CROHNS STARTER) 40 MG/0.8ML PNKT Inject 160 mg into the skin every 14 (fourteen) days. Inject 160 mg into skin on day 0, 80 mg into skin on day 14 06/06/16  Yes Gatha Mayer, MD  cyclobenzaprine (FLEXERIL) 10 MG tablet Take 10 mg by mouth 3 (three) times daily as needed (for migraines).   Yes Historical Provider, MD  HYDROcodone-acetaminophen (NORCO/VICODIN) 5-325 MG tablet Take 1-2 tablets by mouth every 6 (six) hours as needed. Patient taking differently: Take 1-2 tablets by mouth every 6 (six) hours as needed for moderate pain.  05/28/16  Yes Montine Circle, PA-C  lisinopril (PRINIVIL,ZESTRIL) 40 MG tablet Take 1 tablet (40 mg total) by mouth daily. 06/26/16  Yes Maryann Mikhail, DO  omeprazole-sodium bicarbonate (ZEGERID) 40-1100 MG capsule Take 1 capsule by mouth daily with supper.   Yes Historical Provider, MD  prochlorperazine (COMPAZINE) 25 MG suppository Place 25 mg rectally every 12 (twelve) hours as needed for nausea or vomiting.   Yes Historical Provider, MD  rizatriptan (MAXALT-MLT) 10 MG disintegrating tablet Take 1 tablet (10 mg total) by mouth 3 (three) times daily as needed for migraine. 04/06/16  Yes Kathrynn Ducking, MD  verapamil  (CALAN-SR) 180 MG CR tablet Take 360 mg by mouth daily.   Yes Historical Provider, MD  doxazosin (CARDURA) 1 MG tablet Take 1 tablet (1 mg total) by mouth daily. Patient not taking: Reported on 07/04/2016 06/14/16   [name], MD   Social History   Social History  . Marital status: Married    Spouse name: N/A  . Number of children: 3  . Years of education: Some college   Occupational History  . [employer]    Social History Main Topics  . Smoking status: Current Every Day Smoker    Packs/day: 0.50    Years: 21.00    Types: Cigarettes  . Smokeless tobacco: Former Systems developer    Types: Snuff     Comment: Counseling sheet given in exam room   . Alcohol use No  . Drug use: No  . Sexual activity: Yes   Other Topics Concern  . Not on file   Social History  Narrative   Married. Education: [employer].    Lives at home w/ his wife and kids   Right-handed   Caffeine: 3 sodas per day   Review of Systems  Constitutional: Negative for fatigue and unexpected weight change.  Eyes: Negative for visual disturbance.  Respiratory: Negative for cough, chest tightness and shortness of breath.   Cardiovascular: Negative for chest pain, palpitations and leg swelling.  Gastrointestinal: Negative for abdominal pain and blood in stool.  Neurological: Negative for dizziness, light-headedness and headaches.       Objective:   Physical Exam  Constitutional: He is oriented to person, place, and time. He appears well-developed and well-nourished.  HENT:  Head: Normocephalic and atraumatic.  Eyes: EOM are normal. Pupils are equal, round, and reactive to light.  Neck: No JVD present. Carotid bruit is not present.  Cardiovascular: Normal rate, regular rhythm and normal heart sounds.   No murmur heard. Pulmonary/Chest: Effort normal and breath sounds normal. He has no rales.  Musculoskeletal: He exhibits no edema.  Neurological: He is alert and oriented to person, place, and time.  Skin: Skin is warm  and dry.  Psychiatric: He has a normal mood and affect.  Vitals reviewed.   Vitals:   07/04/16 1716  BP: (!) 144/90  Pulse: (!) 59  Resp: 18  Temp: 98 F (36.7 C)  TempSrc: Oral  SpO2: 99%  Weight: (!) 301 lb (136.5 kg)  Height: 6' 1"  (1.854 m)      Assessment & Plan:    Craig Guzman is a 46 y.o. male Acute pancreatitis, unspecified complication status, unspecified pancreatitis type - Plan: Basic metabolic panel, Lipase  - Improved. Tolerating soft diet, denies recurrence of symptoms. Thought to have been due to HCTZ, but unknown specific cause. We'll continue to hold HCTZ, follow-up with gastroenterology discussed. Lipase rechecked, RTC/ER precautions given.  Essential hypertension - Plan: lisinopril (PRINIVIL,ZESTRIL) 40 MG tablet, doxazosin (CARDURA) 1 MG tablet, verapamil (CALAN-SR) 180 MG CR tablet  - Borderline hypokalemia in hospital. Repeat BMP. Borderline control in office, but improved from previous visit. Suspect this will improve with weight loss and continued decrease in tobacco use.   -Continue lisinopril 40 mg daily, verapamil 360 mg daily, and Cardura 1 mg daily at bedtime for now. If blood pressure remains elevated, increase Cardura to 2 mg daily at bedtime, and possible side effects/precautions discussed.  Tobacco abuse  - Commended on his efforts to quit. Resources provided, advised let me know if I can help.  Meds ordered this encounter  Medications  . lisinopril (PRINIVIL,ZESTRIL) 40 MG tablet    Sig: Take 1 tablet (40 mg total) by mouth daily.    Dispense:  90 tablet    Refill:  1  . doxazosin (CARDURA) 1 MG tablet    Sig: Take 1 tablet (1 mg total) by mouth daily.    Dispense:  90 tablet    Refill:  1  . verapamil (CALAN-SR) 180 MG CR tablet    Sig: Take 2 tablets (360 mg total) by mouth daily.    Dispense:  180 tablet    Refill:  1   Patient Instructions   Call [name] to set up follow-up appointment in next 2 weeks if possible as  recommended at hospital discharge for pancreatitis.   I will check your lipase again today, but if any return of abdominal pain, nausea or vomiting, return here or ER for evaluation. Soft diet was recommended at hospital discharge, can advance diet slowly.  Monitor your blood pressures outside of the office, and if they remain over 140/90 can increase to 2 mg of doxazosin at night (2 pills). If you are having lightheadedness, dizziness or new side effects of that medication, or blood pressure remains elevated, return to discuss other options. Let me know if you do end up needing the higher dose as you will need refills sooner. Otherwise follow-up in 6 months for a physical.  Keep up the good work with quitting smoking. Let me know if I can help with any resources. [place] offers smoking cessation clinics. Registration is required. To register call [phone] or register online at [email or web address]    Steps to Quit Smoking Smoking tobacco can be bad for your health. It can also affect almost every organ in your body. Smoking puts you and people around you at risk for many serious long-lasting (chronic) diseases. Quitting smoking is hard, but it is one of the best things that you can do for your health. It is never too late to quit. What are the benefits of quitting smoking? When you quit smoking, you lower your risk for getting serious diseases and conditions. They can include:  Lung cancer or lung disease.  Heart disease.  Stroke.  Heart attack.  Not being able to have children (infertility).  Weak bones (osteoporosis) and broken bones (fractures). If you have coughing, wheezing, and shortness of breath, those symptoms may get better when you quit. You may also get sick less often. If you are pregnant, quitting smoking can help to lower your chances of having a baby of low birth weight. What can I do to help me quit smoking? Talk with your doctor about what can help you quit  smoking. Some things you can do (strategies) include:  Quitting smoking totally, instead of slowly cutting back how much you smoke over a period of time.  Going to in-person counseling. You are more likely to quit if you go to many counseling sessions.  Using resources and support systems, such as:  Online chats with a Social worker.  Phone quitlines.  Printed Furniture conservator/restorer.  Support groups or group counseling.  Text messaging programs.  Mobile phone apps or applications.  Taking medicines. Some of these medicines may have nicotine in them. If you are pregnant or breastfeeding, do not take any medicines to quit smoking unless your doctor says it is okay. Talk with your doctor about counseling or other things that can help you. Talk with your doctor about using more than one strategy at the same time, such as taking medicines while you are also going to in-person counseling. This can help make quitting easier. What things can I do to make it easier to quit? Quitting smoking might feel very hard at first, but there is a lot that you can do to make it easier. Take these steps:  Talk to your family and friends. Ask them to support and encourage you.  Call phone quitlines, reach out to support groups, or work with a Social worker.  Ask people who smoke to not smoke around you.  Avoid places that make you want (trigger) to smoke, such as:  Bars.  Parties.  Smoke-break areas at work.  Spend time with people who do not smoke.  Lower the stress in your life. Stress can make you want to smoke. Try these things to help your stress:  Getting regular exercise.  Deep-breathing exercises.  Yoga.  Meditating.  Doing a body scan. To do this, close your eyes,  focus on one area of your body at a time from head to toe, and notice which parts of your body are tense. Try to relax the muscles in those areas.  Download or buy apps on your mobile phone or tablet that can help you stick to  your quit plan. There are many free apps, such as QuitGuide from the [employer] Office manager for Disease Control and Prevention). You can find more support from smokefree.gov and other websites. This information is not intended to replace advice given to you by your health care provider. Make sure you discuss any questions you have with your health care provider. Document Released: 04/15/2009 Document Revised: 02/15/2016 Document Reviewed: 11/03/2014 Elsevier Interactive Patient Education  2017 [employer].    IF you received an x-ray today, you will receive an invoice from [facility]. Please contact [facility]iology at [phone] with questions or concerns regarding your invoice.   IF you received labwork today, you will receive an invoice from [facility]. Please contact [facility] at [phone] with questions or concerns regarding your invoice.   Our billing staff will not be able to assist you with questions regarding bills from these companies.  You will be contacted with the lab results as soon as they are available. The fastest way to get your results is to activate your My Chart account. Instructions are located on the last page of this paperwork. If you have not heard from [place] regarding the results in 2 weeks, please contact this office.       I personally performed the services described in this documentation, which was scribed in my presence. The recorded information has been reviewed and considered, and addended by me as needed.   Signed,   Merri Ray, MD [facility].  07/04/16 8:07 PM

## 2016-07-05 ENCOUNTER — Telehealth: Payer: Self-pay | Admitting: Internal Medicine

## 2016-07-05 LAB — LIPASE: Lipase: 76 U/L (ref 13–78)

## 2016-07-05 LAB — BASIC METABOLIC PANEL
BUN/Creatinine Ratio: 11 (ref 9–20)
BUN: 11 mg/dL (ref 6–24)
CALCIUM: 9.2 mg/dL (ref 8.7–10.2)
CO2: 23 mmol/L (ref 18–29)
CREATININE: 0.97 mg/dL (ref 0.76–1.27)
Chloride: 101 mmol/L (ref 96–106)
GFR, EST AFRICAN AMERICAN: 109 mL/min/{1.73_m2} (ref >59)
GFR, EST NON AFRICAN AMERICAN: 95 mL/min/{1.73_m2} (ref >59)
Glucose: 93 mg/dL (ref 65–99)
Potassium: 4.1 mmol/L (ref 3.5–5.2)
Sodium: 140 mmol/L (ref 134–144)

## 2016-07-05 NOTE — Telephone Encounter (Signed)
Patient's wife notified I scehduled the patient for 07/11/16

## 2016-07-11 ENCOUNTER — Encounter: Payer: Self-pay | Admitting: Internal Medicine

## 2016-07-11 ENCOUNTER — Ambulatory Visit (INDEPENDENT_AMBULATORY_CARE_PROVIDER_SITE_OTHER): Payer: Managed Care, Other (non HMO) | Admitting: Internal Medicine

## 2016-07-11 VITALS — BP 116/90 | HR 72 | Ht 73.0 in | Wt 299.0 lb

## 2016-07-11 DIAGNOSIS — K50819 Crohn's disease of both small and large intestine with unspecified complications: Secondary | ICD-10-CM

## 2016-07-11 DIAGNOSIS — K8500 Idiopathic acute pancreatitis without necrosis or infection: Secondary | ICD-10-CM | POA: Diagnosis not present

## 2016-07-11 NOTE — Patient Instructions (Addendum)
Dr Carlean Purl is giving you Probiotic Medical Food - take once daily (keep refrigerated)  Follow-up with [name] in four months.  Thank you for allowing [place] to take care of you today.

## 2016-07-11 NOTE — Assessment & Plan Note (Signed)
Probiotic vsl#3

## 2016-07-11 NOTE — Progress Notes (Signed)
   Craig Guzman 46 y.o. [birth year] [id number]  Assessment & Plan:   Encounter Diagnoses  Name Primary?  . Crohn's disease of small and large intestines with complication ([place]) Yes  . Idiopathic acute pancreatitis without infection or necrosis     Add VSL # 3 for a while re: belching/diarrhea episodes Continue Humira  Observe off HCTZ re: pancreatitis - seems recovered RTC 4 mos    Subjective:   Chief Complaint: f/u Crohn's and recent pancreatitis  HPI  Late Dec had acute pancreatitis - HCTZ stopped during hospitalization. TG were 77 Those sxs all gone. Less nausea and vomiting than in the past Is having some episodic foul smelling belching and then 1-2 d of diarrhea/loose stools  Medications, allergies, past medical history, past surgical history, family history and social history are reviewed and updated in the EMR.   Review of Systems As above  Objective:   Physical Exam BP 116/90   Pulse 72   Ht 6' 1"  (1.854 m)   Wt 299 lb (135.6 kg)   BMI 39.45 kg/m  Abd is soft NT no masses Alert and oriented x 3  Reviewed hospital H and P, DC summary, CT

## 2016-07-17 ENCOUNTER — Telehealth: Payer: Self-pay

## 2016-07-17 NOTE — Telephone Encounter (Signed)
Omeprazole pa needed  Key [id number]

## 2016-07-19 NOTE — Telephone Encounter (Signed)
PA has been Approved

## 2016-07-21 ENCOUNTER — Other Ambulatory Visit: Payer: Self-pay

## 2016-07-21 NOTE — Telephone Encounter (Signed)
[facility] approved Omeprazole-Sodium Bicarbonate caps from 06/19/2016-10/17/2016

## 2016-08-15 ENCOUNTER — Telehealth: Payer: Self-pay | Admitting: Neurology

## 2016-08-15 DIAGNOSIS — G4733 Obstructive sleep apnea (adult) (pediatric): Secondary | ICD-10-CM

## 2016-08-15 DIAGNOSIS — Z9989 Dependence on other enabling machines and devices: Principal | ICD-10-CM

## 2016-08-15 NOTE — Telephone Encounter (Signed)
Patient requesting a Rx be sent to [facility] to change the pressure because CPAP is blowing too much air in. Please call and discuss.

## 2016-08-16 NOTE — Telephone Encounter (Signed)
Offer auto titration upon patients request.

## 2016-08-16 NOTE — Addendum Note (Signed)
Addended by: Larey Seat on: 08/16/2016 02:06 PM   Modules accepted: Orders

## 2016-08-16 NOTE — Telephone Encounter (Signed)
What would you like to do? I have his download if you would like to see it.

## 2016-08-17 NOTE — Telephone Encounter (Signed)
I spoke to patient and let him know that [name] has sent in new orders. I have faxed them into [facility]

## 2016-10-09 ENCOUNTER — Telehealth: Payer: Self-pay

## 2016-10-09 ENCOUNTER — Encounter: Payer: Self-pay | Admitting: Adult Health

## 2016-10-09 ENCOUNTER — Ambulatory Visit (INDEPENDENT_AMBULATORY_CARE_PROVIDER_SITE_OTHER): Payer: Managed Care, Other (non HMO) | Admitting: Adult Health

## 2016-10-09 VITALS — BP 140/94 | HR 75 | Wt 296.6 lb

## 2016-10-09 DIAGNOSIS — G43009 Migraine without aura, not intractable, without status migrainosus: Secondary | ICD-10-CM

## 2016-10-09 MED ORDER — CYCLOBENZAPRINE HCL 10 MG PO TABS: 10.0000 mg | ORAL_TABLET | Freq: Three times a day (TID) | ORAL | 0 refills | Status: DC | PRN

## 2016-10-09 MED ORDER — RIZATRIPTAN BENZOATE 10 MG PO TBDP: 10.0000 mg | ORAL_TABLET | Freq: Three times a day (TID) | ORAL | 3 refills | Status: DC | PRN

## 2016-10-09 NOTE — Telephone Encounter (Signed)
Pt stopped by sleep lab today after appointment with Hedwig Morton.  Pt had questions and concerns about CPAP mask and machine. Pt is having issues tolerating several masks and pressure is feeling too strong.  Pt stated that he wasn't getting much help with [facility] (DME supplying amchine, mask, and supplies). Darnelle Bos contacted by me at 4:20 pm on 10/09/16.  Jeneen Rinks will be looking into and following up with patient to get him taken care of appropriately.  Jeneen Rinks will also be letting me know the action plan for patient as well.

## 2016-10-09 NOTE — Progress Notes (Signed)
I have read the note, and I agree with the clinical assessment and plan.  Zameria Vogl KEITH   

## 2016-10-09 NOTE — Progress Notes (Signed)
PATIENT: Craig Guzman DOB: October 21, 1970  REASON FOR VISIT: follow up- migraine headache HISTORY FROM: patient  HISTORY OF PRESENT ILLNESS: Craig Guzman is a 46 year old male with a history of migraine headaches. He returns today for an evaluation. He states that his headaches have been under relatively good control. He states since the last visit in October he is only having 1 migraine. He states that he has a headache approximately every other week. He states his migraines typically respond to Maxalt and Flexeril. He states that he is happy with his management of his migraines. He states that he is trying to use the CPAP machine for sleep apnea. He reports that he is struggling with being able to use it at night as he feels that his nostrils close up. He plans to make a follow-up appointment with [name]  HISTORY 04/06/16: Craig Guzman is a 46 year old right-handed black male with a history of migraine headaches since 1990. The patient has previously been seen by Dr. Earley Favor for treatment. Currently, he is having 2 or 3 headaches a month, the headaches usually are bifrontal in nature, and may spread to the back of the head. The patient may have nausea and vomiting, photophobia and phonophobia when the headaches become severe. The patient denies any neck stiffness. He denies any numbness or weakness of the face, arms, or legs. Occasionally weather changes or allergy related exposure such as grass or mold may bring on a headache. The patient claims that the headache usually is not associated with any visual changes, the headache may last for 5 hours. He takes Flexeril for the headache which seems to be helpful if he can catch it early. He is sent to this office for an evaluation. His mother also has a history of migraine headache.  REVIEW OF SYSTEMS: Out of a complete 14 system review of symptoms, the patient complains only of the following symptoms, and all other reviewed systems are  negative.  Frequency of urination, headache, environmental allergies, nausea, vomiting, eye itching, eye redness  ALLERGIES: Allergies  Allergen Reactions  . Zithromax [Azithromycin] Hypertension    HOME MEDICATIONS: Outpatient Medications Prior to Visit  Medication Sig Dispense Refill  . Adalimumab (HUMIRA PEN) 40 MG/0.8ML PNKT Inject 40 mg into the skin every 14 (fourteen) days. After completion of the starter 2 each 6  . Adalimumab (HUMIRA PEN-CROHNS STARTER) 40 MG/0.8ML PNKT Inject 160 mg into the skin every 14 (fourteen) days. Inject 160 mg into skin on day 0, 80 mg into skin on day 14 1 each kit  . cyclobenzaprine (FLEXERIL) 10 MG tablet Take 10 mg by mouth 3 (three) times daily as needed (for migraines).    Marland Kitchen doxazosin (CARDURA) 1 MG tablet Take 1 tablet (1 mg total) by mouth daily. 90 tablet 1  . HYDROcodone-acetaminophen (NORCO/VICODIN) 5-325 MG tablet Take 1-2 tablets by mouth every 6 (six) hours as needed. (Patient taking differently: Take 1-2 tablets by mouth every 6 (six) hours as needed for moderate pain. ) 10 tablet 0  . lisinopril (PRINIVIL,ZESTRIL) 40 MG tablet Take 1 tablet (40 mg total) by mouth daily. 90 tablet 1  . omeprazole-sodium bicarbonate (ZEGERID) 40-1100 MG capsule Take 1 capsule by mouth daily with supper.    . prochlorperazine (COMPAZINE) 25 MG suppository Place 25 mg rectally every 12 (twelve) hours as needed for nausea or vomiting.    . rizatriptan (MAXALT-MLT) 10 MG disintegrating tablet Take 1 tablet (10 mg total) by mouth 3 (three) times daily as  needed for migraine. 9 tablet 3  . verapamil (CALAN-SR) 180 MG CR tablet Take 2 tablets (360 mg total) by mouth daily. 180 tablet 1   No facility-administered medications prior to visit.     PAST MEDICAL HISTORY: Past Medical History:  Diagnosis Date  . Allergy    trees/ grasses/ animals/dust/mold  . Anxiety   . Biliary dyskinesia   . Colloid thyroid nodule   . Crohn's disease ([place])    Stomach, terminal  ileum, cecum  . Gastric ulcer    antral  . GERD (gastroesophageal reflux disease)   . History of migraine headaches   . HTN (hypertension)   . Kidney stone 09/20/2012  . Migraine   . Obesity   . Pneumonia 02/27/2014   ED    PAST SURGICAL HISTORY: Past Surgical History:  Procedure Laterality Date  . CHOLECYSTECTOMY  2011   [place]  . COLONOSCOPY  07/26/11   Crohn's colitis, ileitis  . ESOPHAGOGASTRODUODENOSCOPY  05/04/11, 07/26/11   granulomatous gastritis - Crohn's  . FLEXIBLE SIGMOIDOSCOPY    . JOINT REPLACEMENT    . MULTIPLE TOOTH EXTRACTIONS  2005  . SHOULDER SURGERY     right  . VASECTOMY     bilateral w/lysis of penile adhesions    FAMILY HISTORY: Family History  Problem Relation Age of Onset  . Colon polyps Father   . Diabetes Father   . Hypertension Father   . Hypertension Mother   . Asthma Mother   . Heart disease Maternal Grandmother   . Colon cancer Neg Hx   . Rectal cancer Neg Hx   . Stomach cancer Neg Hx   . Esophageal cancer Neg Hx     SOCIAL HISTORY: Social History   Social History  . Marital status: Married    Spouse name: N/A  . Number of children: 3  . Years of education: Some college   Occupational History  . [employer]    Social History Main Topics  . Smoking status: Current Every Day Smoker    Packs/day: 0.50    Years: 21.00    Types: Cigarettes  . Smokeless tobacco: Former Systems developer    Types: Snuff     Comment: Counseling sheet given in exam room   . Alcohol use No  . Drug use: No  . Sexual activity: Yes   Other Topics Concern  . Not on file   Social History Narrative   Married. Education: [employer].    Lives at home w/ his wife and kids   Right-handed   Caffeine: 3 sodas per day      PHYSICAL EXAM  Vitals:   10/09/16 1530  BP: (!) 140/94  Pulse: 75  Weight: 296 lb 9.6 oz (134.5 kg)   Body mass index is 39.13 kg/m.  Generalized: Well developed, in no acute distress   Neurological examination  Mentation: Alert  oriented to time, place, history taking. Follows all commands speech and language fluent Cranial nerve II-XII: Pupils were equal round reactive to light. Extraocular movements were full, visual field were full on confrontational test. Facial sensation and strength were normal. Uvula tongue midline. Head turning and shoulder shrug  were normal and symmetric. Motor: The motor testing reveals 5 over 5 strength of all 4 extremities. Good symmetric motor tone is noted throughout.  Sensory: Sensory testing is intact to soft touch on all 4 extremities. No evidence of extinction is noted.  Coordination: Cerebellar testing reveals good finger-nose-finger and heel-to-shin bilaterally.  Gait and station: Gait is  normal. Tandem gait is normal. Romberg is negative. No drift is seen.  Reflexes: Deep tendon reflexes are symmetric and normal bilaterally.   DIAGNOSTIC DATA (LABS, IMAGING, TESTING) - I reviewed patient records, labs, notes, testing and imaging myself where available.  Lab Results  Component Value Date   WBC 6.5 06/26/2016   HGB 13.0 06/26/2016   HCT 38.2 (L) 06/26/2016   MCV 85.7 06/26/2016   PLT 122 (L) 06/26/2016      Component Value Date/Time   NA 140 07/04/2016 1936   K 4.1 07/04/2016 1936   CL 101 07/04/2016 1936   CO2 23 07/04/2016 1936   GLUCOSE 93 07/04/2016 1936   GLUCOSE 91 06/26/2016 0411   BUN 11 07/04/2016 1936   CREATININE 0.97 07/04/2016 1936   CREATININE 1.17 03/10/2016 1826   CALCIUM 9.2 07/04/2016 1936   PROT 6.0 (L) 06/25/2016 0518   ALBUMIN 3.0 (L) 06/25/2016 0518   AST 24 06/25/2016 0518   ALT 31 06/25/2016 0518   ALKPHOS 103 06/25/2016 0518   BILITOT 1.1 06/25/2016 0518   GFRNONAA 95 07/04/2016 1936   GFRNONAA 75 03/10/2016 1826   GFRAA 109 07/04/2016 1936   GFRAA 87 03/10/2016 1826   Lab Results  Component Value Date   CHOL 152 06/24/2016   HDL 31 (L) 06/24/2016   LDLCALC 106 (H) 06/24/2016   TRIG 77 06/24/2016   CHOLHDL 4.9 06/24/2016        ASSESSMENT AND PLAN 46 y.o. year old male  has a past medical history of Allergy; Anxiety; Biliary dyskinesia; Colloid thyroid nodule; Crohn's disease ([place]); Gastric ulcer; GERD (gastroesophageal reflux disease); History of migraine headaches; HTN (hypertension); Kidney stone (09/20/2012); Migraine; Obesity; and Pneumonia (02/27/2014). here with:  1. Migraine headaches  The patient's migraines have been controlled with Flexeril and Maxalt. Patient is advised that if his headache frequency increases he should let us know. Patient also advised that if he develops any new symptoms he should also make [place] aware. He will follow-up in one year or sooner if needed.  I spent 15 minutes with the patient 50% this time was spent discussing prescribed medication     Ward Givens, MSN, NP-C 10/09/2016, 3:25 PM [facility] [address], [place], [place] [address] [phone]

## 2016-10-09 NOTE — Patient Instructions (Signed)
Continue Maxalt and flexeril for treatment of acute headache If your symptoms worsen or you develop new symptoms please let us know.

## 2016-11-06 ENCOUNTER — Ambulatory Visit (INDEPENDENT_AMBULATORY_CARE_PROVIDER_SITE_OTHER): Payer: Managed Care, Other (non HMO) | Admitting: Family Medicine

## 2016-11-06 ENCOUNTER — Encounter: Payer: Self-pay | Admitting: Family Medicine

## 2016-11-06 VITALS — BP 138/84 | HR 90 | Temp 98.1°F | Resp 16 | Ht 72.0 in | Wt 287.4 lb

## 2016-11-06 DIAGNOSIS — R351 Nocturia: Secondary | ICD-10-CM | POA: Diagnosis not present

## 2016-11-06 DIAGNOSIS — J309 Allergic rhinitis, unspecified: Secondary | ICD-10-CM

## 2016-11-06 DIAGNOSIS — I10 Essential (primary) hypertension: Secondary | ICD-10-CM

## 2016-11-06 DIAGNOSIS — R0982 Postnasal drip: Secondary | ICD-10-CM | POA: Diagnosis not present

## 2016-11-06 MED ORDER — DOXAZOSIN MESYLATE 2 MG PO TABS: 2.0000 mg | ORAL_TABLET | Freq: Every day | ORAL | 1 refills | Status: DC

## 2016-11-06 NOTE — Patient Instructions (Addendum)
Change doxazosin to 2 mg to see if that has better control of your blood pressure as well as nighttime urination symptoms. Make sure to stay hydrated throughout the day, less fluids just before bedtime.  For postnasal drainage, can try Allegra or Claritin over-the-counter. If any increased difficulty with urination, stop that medication. Also use Nasacort or Nasonex, and avoid leaning head backwards for 15 minutes after use.  Return to the clinic or go to the nearest emergency room if any of your symptoms worsen or new symptoms occur.  Recheck in the next 3 months for cholesterol testing and reassessment of your blood pressure.  Allergic Rhinitis Allergic rhinitis is when the mucous membranes in the nose respond to allergens. Allergens are particles in the air that cause your body to have an allergic reaction. This causes you to release allergic antibodies. Through a chain of events, these eventually cause you to release histamine into the blood stream. Although meant to protect the body, it is this release of histamine that causes your discomfort, such as frequent sneezing, congestion, and an itchy, runny nose. What are the causes? Seasonal allergic rhinitis (hay fever) is caused by pollen allergens that may come from grasses, trees, and weeds. Year-round allergic rhinitis (perennial allergic rhinitis) is caused by allergens such as house dust mites, pet dander, and mold spores. What are the signs or symptoms?  Nasal stuffiness (congestion).  Itchy, runny nose with sneezing and tearing of the eyes. How is this diagnosed? Your health care provider can help you determine the allergen or allergens that trigger your symptoms. If you and your health care provider are unable to determine the allergen, skin or blood testing may be used. Your health care provider will diagnose your condition after taking your health history and performing a physical exam. Your health care provider may assess you for other  related conditions, such as asthma, pink eye, or an ear infection. How is this treated? Allergic rhinitis does not have a cure, but it can be controlled by:  Medicines that block allergy symptoms. These may include allergy shots, nasal sprays, and oral antihistamines.  Avoiding the allergen. Hay fever may often be treated with antihistamines in pill or nasal spray forms. Antihistamines block the effects of histamine. There are over-the-counter medicines that may help with nasal congestion and swelling around the eyes. Check with your health care provider before taking or giving this medicine. If avoiding the allergen or the medicine prescribed do not work, there are many new medicines your health care provider can prescribe. Stronger medicine may be used if initial measures are ineffective. Desensitizing injections can be used if medicine and avoidance does not work. Desensitization is when a patient is given ongoing shots until the body becomes less sensitive to the allergen. Make sure you follow up with your health care provider if problems continue. Follow these instructions at home: It is not possible to completely avoid allergens, but you can reduce your symptoms by taking steps to limit your exposure to them. It helps to know exactly what you are allergic to so that you can avoid your specific triggers. Contact a health care provider if:  You have a fever.  You develop a cough that does not stop easily (persistent).  You have shortness of breath.  You start wheezing.  Symptoms interfere with normal daily activities. This information is not intended to replace advice given to you by your health care provider. Make sure you discuss any questions you have with your health  care provider. Document Released: 03/14/2001 Document Revised: 02/18/2016 Document Reviewed: 02/24/2013 Elsevier Interactive Patient Education  2017 [employer].  IF you received an x-ray today, you will receive an  invoice from [facility]. Please contact [facility] at [phone] with questions or concerns regarding your invoice.   IF you received labwork today, you will receive an invoice from [facility]. Please contact [facility] at [phone] with questions or concerns regarding your invoice.   Our billing staff will not be able to assist you with questions regarding bills from these companies.  You will be contacted with the lab results as soon as they are available. The fastest way to get your results is to activate your My Chart account. Instructions are located on the last page of this paperwork. If you have not heard from [place] regarding the results in 2 weeks, please contact this office.

## 2016-11-06 NOTE — Progress Notes (Signed)
By signing my name below, I, Mesha Guinyard, attest that this documentation has been prepared under the direction and in the presence of Merri Ray, MD.  Electronically Signed: Verlee Monte, Medical Scribe. 11/06/16. 3:19 PM.  Subjective:    Patient ID: Craig Guzman, male    DOB: February 22, 1971, 46 y.o.   MRN: [id number]  HPI Chief Complaint  Patient presents with  . Sinus drainage    "going down back of throat" x 1 1/2 months, color clear drainage  . Headache    HPI Comments: Craig Guzman is a 46 y.o. male with a PMHx of HTN, migraine HAs, and other medical problems who presents to Primary Care at [facility] complaining of HA and sinus drainage onset 1.5 months. Pt has been getting HAs 1x a week and a few migraines. Reports associated sxs of N/V for 1-2 days when the postnasal dranage is swallowed and dry sinuses at night when he sleeps with the fan on. Pt has tried flonase 2x without relief of his sxs. Pt's bp has been 140/100 and he's been compliant with his medications (cardura, lisinopril, verapmail, and maxalt ). Denies cough, fever, and GERD.  Lab Results  Component Value Date   CREATININE 0.97 07/04/2016   BP Readings from Last 3 Encounters:  11/06/16 138/84  10/09/16 (!) 140/94  07/11/16 116/90   Nocturia: 2-3x a night, and he urinates 2-3x during the day. Pt last saw the urologist when he got his vasectomy 7 years ago. Denies difficulty urinating. Lab Results  Component Value Date   PSA 0.2 03/25/2016   PSA 0.20 09/19/2011   PSA 0.21 09/01/2011   Patient Active Problem List   Diagnosis Date Noted  . Acute pancreatitis 06/24/2016  . Cigarette nicotine dependence without complication 62/26/3335  . Shifting sleep-work schedule, affecting sleep 05/15/2016  . Sleep deprivation 05/15/2016  . Sleep related hypoventilation in conditions classified elsewhere 05/15/2016  . Sleep related headaches 05/15/2016  . Morbid obesity ([place]) 09/19/2013  . Diarrhea 07/10/2013  .  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 Hypertension 11/26/2012  . Gastroparesis??? 12/08/2011  . Common migraine without aura 12/08/2011  . Long-term use of immunosuppressant medication- Cimzia 08/08/2011  . Crohn's disease of stomach and colon-suspected 05/17/2011  . GERD (gastroesophageal reflux disease) 04/21/2011   Past Medical History:  Diagnosis Date  . Allergy    trees/ grasses/ animals/dust/mold  . Anxiety   . Biliary dyskinesia   . Colloid thyroid nodule   . Crohn's disease ([place])    Stomach, terminal ileum, cecum  . Gastric ulcer    antral  . GERD (gastroesophageal reflux disease)   . History of migraine headaches   . HTN (hypertension)   . Kidney stone 09/20/2012  . Migraine   . Obesity   . Pneumonia 02/27/2014   ED   Past Surgical History:  Procedure Laterality Date  . CHOLECYSTECTOMY  2011   Rosenbower  . COLONOSCOPY  07/26/11   Crohn's colitis, ileitis  . ESOPHAGOGASTRODUODENOSCOPY  05/04/11, 07/26/11   granulomatous gastritis - Crohn's  . FLEXIBLE SIGMOIDOSCOPY    . JOINT REPLACEMENT    . MULTIPLE TOOTH EXTRACTIONS  2005  . SHOULDER SURGERY     right  . VASECTOMY     bilateral w/lysis of penile adhesions   Allergies  Allergen Reactions  . Zithromax [Azithromycin] Hypertension   Prior to Admission medications   Medication Sig Start Date End Date Taking? Authorizing Provider  Adalimumab (HUMIRA PEN) 40 MG/0.8ML PNKT Inject 40 mg into the  skin every 14 (fourteen) days. After completion of the starter 06/06/16   Gatha Mayer, MD  cyclobenzaprine (FLEXERIL) 10 MG tablet Take 1 tablet (10 mg total) by mouth 3 (three) times daily as needed (for migraines). 10/09/16   Ward Givens, NP  doxazosin (CARDURA) 1 MG tablet Take 1 tablet (1 mg total) by mouth daily. 07/04/16   [name], MD  lisinopril (PRINIVIL,ZESTRIL) 40 MG tablet Take 1 tablet (40 mg total) by mouth daily. 07/04/16   [name], MD  omeprazole-sodium bicarbonate (ZEGERID) 40-1100 MG  capsule Take 1 capsule by mouth daily with supper.    [provider]  prochlorperazine (COMPAZINE) 25 MG suppository Place 25 mg rectally every 12 (twelve) hours as needed for nausea or vomiting.    [provider]  rizatriptan (MAXALT-MLT) 10 MG disintegrating tablet Take 1 tablet (10 mg total) by mouth 3 (three) times daily as needed for migraine. 10/09/16   Ward Givens, NP  verapamil (CALAN-SR) 180 MG CR tablet Take 2 tablets (360 mg total) by mouth daily. 07/04/16   [name], MD   Social History   Social History  . Marital status: Married    Spouse name: N/A  . Number of children: 3  . Years of education: Some college   Occupational History  . [employer]    Social History Main Topics  . Smoking status: Current Every Day Smoker    Packs/day: 0.50    Years: 21.00    Types: Cigarettes  . Smokeless tobacco: Former Systems developer    Types: Snuff     Comment: Counseling sheet given in exam room   . Alcohol use No  . Drug use: No  . Sexual activity: Yes   Other Topics Concern  . Not on file   Social History Narrative   Married. Education: [employer].    Lives at home w/ his wife and kids   Right-handed   Caffeine: 3 sodas per day   Review of Systems  Constitutional: Negative for fever.  HENT: Positive for postnasal drip.   Respiratory: Negative for cough.   Gastrointestinal: Positive for nausea and vomiting.  Genitourinary: Positive for difficulty urinating.  Neurological: Positive for headaches.   Objective:  Physical Exam  Constitutional: He appears well-developed and well-nourished. No distress.  HENT:  Head: Normocephalic and atraumatic.  Nose: Right sinus exhibits no maxillary sinus tenderness and no frontal sinus tenderness. Left sinus exhibits no maxillary sinus tenderness and no frontal sinus tenderness.  Edema bilaterally Some clear to white discharge on the right Prominate right tonsil without exudate Sinuses non tender   Eyes: Conjunctivae  are normal.  Neck: Neck supple.  Cardiovascular: Normal rate.   Pulmonary/Chest: Effort normal.  Neurological: He is alert.  Skin: Skin is warm and dry.  Psychiatric: He has a normal mood and affect. His behavior is normal.  Nursing note and vitals reviewed.   Vitals:   11/06/16 1458 11/06/16 1513  BP: (!) 153/95 138/84  Pulse: 90   Resp: 16   Temp: 98.1 F (36.7 C)   TempSrc: Oral   SpO2: 98%   Weight: 287 lb 6.4 oz (130.4 kg)   Height: 6' (1.829 m)    Body mass index is 38.98 kg/m. Assessment & Plan:   Craig Guzman is a 46 y.o. male Nocturia - Plan: doxazosin (CARDURA) 2 MG tablet, PSA  - check PSA, but suspected BPH.  - increasing doxazosin, then may need follow up with urology.   Essential  hypertension - Plan: doxazosin (CARDURA) 2 MG tablet  - increase doxazosin to 4m QD. Continue other meds same doses, recheck 3 months - sooner if persistent elevations.   PND (post-nasal drip) Allergic rhinitis, unspecified seasonality, unspecified trigger  - start otc nonsedating antihistamine, but cautioned on worsening prostate sx's/anticholinergic side effects.   - can change to different steroid NS to see if tolerates better.   -HA may be related to allergy and elevated BP.  RTC precautions discussed if persistent or worsening.   Meds ordered this encounter  Medications  . doxazosin (CARDURA) 2 MG tablet    Sig: Take 1 tablet (2 mg total) by mouth daily.    Dispense:  90 tablet    Refill:  1   Patient Instructions   Change doxazosin to 2 mg to see if that has better control of your blood pressure as well as nighttime urination symptoms. Make sure to stay hydrated throughout the day, less fluids just before bedtime.  For postnasal drainage, can try Allegra or Claritin over-the-counter. If any increased difficulty with urination, stop that medication. Also use Nasacort or Nasonex, and avoid leaning head backwards for 15 minutes after use.  Return to the clinic or go to  the nearest emergency room if any of your symptoms worsen or new symptoms occur.  Recheck in the next 3 months for cholesterol testing and reassessment of your blood pressure.  Allergic Rhinitis Allergic rhinitis is when the mucous membranes in the nose respond to allergens. Allergens are particles in the air that cause your body to have an allergic reaction. This causes you to release allergic antibodies. Through a chain of events, these eventually cause you to release histamine into the blood stream. Although meant to protect the body, it is this release of histamine that causes your discomfort, such as frequent sneezing, congestion, and an itchy, runny nose. What are the causes? Seasonal allergic rhinitis (hay fever) is caused by pollen allergens that may come from grasses, trees, and weeds. Year-round allergic rhinitis (perennial allergic rhinitis) is caused by allergens such as house dust mites, pet dander, and mold spores. What are the signs or symptoms?  Nasal stuffiness (congestion).  Itchy, runny nose with sneezing and tearing of the eyes. How is this diagnosed? Your health care provider can help you determine the allergen or allergens that trigger your symptoms. If you and your health care provider are unable to determine the allergen, skin or blood testing may be used. Your health care provider will diagnose your condition after taking your health history and performing a physical exam. Your health care provider may assess you for other related conditions, such as asthma, pink eye, or an ear infection. How is this treated? Allergic rhinitis does not have a cure, but it can be controlled by:  Medicines that block allergy symptoms. These may include allergy shots, nasal sprays, and oral antihistamines.  Avoiding the allergen. Hay fever may often be treated with antihistamines in pill or nasal spray forms. Antihistamines block the effects of histamine. There are over-the-counter  medicines that may help with nasal congestion and swelling around the eyes. Check with your health care provider before taking or giving this medicine. If avoiding the allergen or the medicine prescribed do not work, there are many new medicines your health care provider can prescribe. Stronger medicine may be used if initial measures are ineffective. Desensitizing injections can be used if medicine and avoidance does not work. Desensitization is when a patient is given ongoing shots  until the body becomes less sensitive to the allergen. Make sure you follow up with your health care provider if problems continue. Follow these instructions at home: It is not possible to completely avoid allergens, but you can reduce your symptoms by taking steps to limit your exposure to them. It helps to know exactly what you are allergic to so that you can avoid your specific triggers. Contact a health care provider if:  You have a fever.  You develop a cough that does not stop easily (persistent).  You have shortness of breath.  You start wheezing.  Symptoms interfere with normal daily activities. This information is not intended to replace advice given to you by your health care provider. Make sure you discuss any questions you have with your health care provider. Document Released: 03/14/2001 Document Revised: 02/18/2016 Document Reviewed: 02/24/2013 Elsevier Interactive Patient Education  2017 [employer].  IF you received an x-ray today, you will receive an invoice from [facility]. Please contact [facility] at [phone] with questions or concerns regarding your invoice.   IF you received labwork today, you will receive an invoice from [facility]. Please contact [facility] at [phone] with questions or concerns regarding your invoice.   Our billing staff will not be able to assist you with questions regarding bills from these companies.  You will be contacted with the lab  results as soon as they are available. The fastest way to get your results is to activate your My Chart account. Instructions are located on the last page of this paperwork. If you have not heard from [place] regarding the results in 2 weeks, please contact this office.      I personally performed the services described in this documentation, which was scribed in my presence. The recorded information has been reviewed and considered for accuracy and completeness, addended by me as needed, and agree with information above.  Signed,   Merri Ray, MD [facility] [place].  11/06/16 7:07 PM

## 2016-11-07 ENCOUNTER — Emergency Department (HOSPITAL_COMMUNITY)
Admission: EM | Admit: 2016-11-07 | Discharge: 2016-11-07 | Disposition: A | Discharge status: Home / Self Care | Payer: Managed Care, Other (non HMO) | Attending: Emergency Medicine | Admitting: Emergency Medicine

## 2016-11-07 ENCOUNTER — Encounter (HOSPITAL_COMMUNITY): Payer: Self-pay

## 2016-11-07 DIAGNOSIS — I10 Essential (primary) hypertension: Secondary | ICD-10-CM | POA: Diagnosis not present

## 2016-11-07 DIAGNOSIS — F17210 Nicotine dependence, cigarettes, uncomplicated: Secondary | ICD-10-CM | POA: Diagnosis not present

## 2016-11-07 DIAGNOSIS — Z79899 Other long term (current) drug therapy: Secondary | ICD-10-CM | POA: Insufficient documentation

## 2016-11-07 DIAGNOSIS — R519 Headache, unspecified: Secondary | ICD-10-CM

## 2016-11-07 DIAGNOSIS — R51 Headache: Secondary | ICD-10-CM

## 2016-11-07 LAB — PSA: Prostate Specific Ag, Serum: 0.3 ng/mL (ref 0.0–4.0)

## 2016-11-07 MED ORDER — KETOROLAC TROMETHAMINE 60 MG/2ML IM SOLN
60.0000 mg | Freq: Once | INTRAMUSCULAR | Status: AC
  Administered 2016-11-07: 60 mg via INTRAMUSCULAR
  Filled 2016-11-07: qty 2

## 2016-11-07 MED ORDER — PROCHLORPERAZINE MALEATE 10 MG PO TABS
10.0000 mg | ORAL_TABLET | Freq: Once | ORAL | Status: AC
  Administered 2016-11-07: 10 mg via ORAL
  Filled 2016-11-07: qty 1

## 2016-11-07 NOTE — ED Triage Notes (Signed)
Per EMS- Patient noted elevated BP at home, took his BP meds, went to work and began having a dull headache. BP-154/113 at work. EMS reported that VS were normal upon transport, but continued to have a dull headache.

## 2016-11-07 NOTE — ED Provider Notes (Signed)
[facility] DEPT Provider Note   CSN: [id number] Arrival date & time: 11/07/16  1003     History   Chief Complaint Chief Complaint  Patient presents with  . Hypertension  . Headache    HPI Craig Guzman is a 46 y.o. male with history of seasonal allergies, migraines, HTN, Chron's disease, obesity presents to the ED with gradual in onset "dull" all over headache that started ~ 7 AM today.  Associated with bilateral ear pressure and nasal congestion.  Patient went to work and continued to have headache, measured his BP and it was 154/113.  Patient then became concerned and came to ED.  Patient saw Dr. Nyoka Cowden yesterday for headache, he was advised to take medication for seasonal allergies.  Patient has not taken allergy medication yet. Patient reports h/o migraines, he reports today's headaches are "more dull" and not as severe.  No nausea, vomiting, chest pain, SOB, abdominal or back pain. No n/t/w of UE or LE.  HPI  Past Medical History:  Diagnosis Date  . Allergy    trees/ grasses/ animals/dust/mold  . Anxiety   . Biliary dyskinesia   . Colloid thyroid nodule   . Crohn's disease ([employer])    Stomach, terminal ileum, cecum  . Gastric ulcer    antral  . GERD (gastroesophageal reflux disease)   . History of migraine headaches   . HTN (hypertension)   . Kidney stone 09/20/2012  . Migraine   . Obesity   . Pneumonia 02/27/2014   ED    Patient Active Problem List   Diagnosis Date Noted  . Acute pancreatitis 06/24/2016  . Cigarette nicotine dependence without complication 62/95/2841  . Shifting sleep-work schedule, affecting sleep 05/15/2016  . Sleep deprivation 05/15/2016  . Sleep related hypoventilation in conditions classified elsewhere 05/15/2016  . Sleep related headaches 05/15/2016  . Morbid obesity ([place]) 09/19/2013  . Diarrhea 07/10/2013  . 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 Hypertension 11/26/2012  . Gastroparesis??? 12/08/2011  . Common migraine without aura  12/08/2011  . Long-term use of immunosuppressant medication- Cimzia 08/08/2011  . Crohn's disease of stomach and colon-suspected 05/17/2011  . GERD (gastroesophageal reflux disease) 04/21/2011    Past Surgical History:  Procedure Laterality Date  . CHOLECYSTECTOMY  2011   Rosenbower  . COLONOSCOPY  07/26/11   Crohn's colitis, ileitis  . ESOPHAGOGASTRODUODENOSCOPY  05/04/11, 07/26/11   granulomatous gastritis - Crohn's  . FLEXIBLE SIGMOIDOSCOPY    . JOINT REPLACEMENT    . MULTIPLE TOOTH EXTRACTIONS  2005  . SHOULDER SURGERY     right  . VASECTOMY     bilateral w/lysis of penile adhesions       Home Medications    Prior to Admission medications   Medication Sig Start Date End Date Taking? Authorizing Provider  Adalimumab (HUMIRA PEN) 40 MG/0.8ML PNKT Inject 40 mg into the skin every 14 (fourteen) days. After completion of the starter 06/06/16  Yes Gatha Mayer, MD  cyclobenzaprine (FLEXERIL) 10 MG tablet Take 1 tablet (10 mg total) by mouth 3 (three) times daily as needed (for migraines). 10/09/16  Yes Ward Givens, NP  doxazosin (CARDURA) 2 MG tablet Take 1 tablet (2 mg total) by mouth daily. 11/06/16  Yes [name], MD  lisinopril (PRINIVIL,ZESTRIL) 40 MG tablet Take 1 tablet (40 mg total) by mouth daily. 07/04/16  Yes [name], MD  omeprazole-sodium bicarbonate (ZEGERID) 40-1100 MG capsule Take 1 capsule by mouth daily with supper.   Yes [provider]  prochlorperazine (COMPAZINE) 25 MG suppository Place 25 mg rectally every 12 (twelve) hours as needed for nausea or vomiting.   Yes [provider]  rizatriptan (MAXALT-MLT) 10 MG disintegrating tablet Take 1 tablet (10 mg total) by mouth 3 (three) times daily as needed for migraine. 10/09/16  Yes Ward Givens, NP  verapamil (CALAN-SR) 180 MG CR tablet Take 2 tablets (360 mg total) by mouth daily. 07/04/16  Yes [name], MD    Family History Family History  Problem Relation Age of  Onset  . Colon polyps Father   . Diabetes Father   . Hypertension Father   . Hypertension Mother   . Asthma Mother   . Heart disease Maternal Grandmother   . Colon cancer Neg Hx   . Rectal cancer Neg Hx   . Stomach cancer Neg Hx   . Esophageal cancer Neg Hx     Social History Social History  Substance Use Topics  . Smoking status: Current Every Day Smoker    Packs/day: 0.50    Years: 21.00    Types: Cigarettes  . Smokeless tobacco: Former Systems developer    Types: Snuff     Comment: Counseling sheet given in exam room   . Alcohol use No     Allergies   Zithromax [azithromycin]   Review of Systems Review of Systems  Constitutional: Negative for chills and fever.  HENT: Positive for congestion, ear pain, postnasal drip and rhinorrhea. Negative for sore throat.   Eyes: Negative for photophobia and visual disturbance.  Respiratory: Negative for cough and shortness of breath.   Cardiovascular: Negative for chest pain and palpitations.  Gastrointestinal: Negative for abdominal pain, blood in stool, constipation, diarrhea, nausea and vomiting.  Genitourinary: Negative for difficulty urinating, flank pain and hematuria.  Musculoskeletal: Negative for joint swelling, myalgias and neck pain.  Skin: Negative for rash.  Neurological: Positive for headaches. Negative for dizziness, tremors, syncope, speech difficulty, weakness, light-headedness and numbness.  Hematological: Negative.   Psychiatric/Behavioral: Negative.      Physical Exam Updated Vital Signs BP 133/73   Pulse (!) 49   Temp 98.1 F (36.7 C) (Oral)   Resp 18   Ht 6' (1.829 m)   Wt 130.2 kg   SpO2 99%   BMI 38.92 kg/m   Physical Exam  Constitutional: He is oriented to person, place, and time. He appears well-developed and well-nourished. No distress.  Non toxic appearing.   HENT:  Head: Normocephalic and atraumatic.  Nose: Mucosal edema present.  Mouth/Throat: Posterior oropharyngeal erythema present. No  oropharyngeal exudate.  Mildly erythematous oropharynx with cobblestoning Moderate mucosal edema  TMs normal  Eyes: Conjunctivae and EOM are normal. Pupils are equal, round, and reactive to light.  Neck: Normal range of motion. Neck supple. No JVD present.  Cardiovascular: Normal rate, regular rhythm, normal heart sounds and intact distal pulses.   No murmur heard. Pulmonary/Chest: Effort normal and breath sounds normal. No respiratory distress. He has no wheezes. He has no rales.  Abdominal: Soft. Bowel sounds are normal. He exhibits no distension and no mass. There is no tenderness.  Musculoskeletal: Normal range of motion. He exhibits no deformity.  Lymphadenopathy:    He has no cervical adenopathy.  Neurological: He is alert and oriented to person, place, and time.  Pt is alert and oriented.   Speech and phonation normal.   Thought process coherent.   Strength 5/5 in upper and lower extremities.   Sensation to light touch intact in upper and lower  extremities.  Gait normal.   Negative Romberg. No leg drift.  Intact finger to nose test. CN I not tested CN II full visual fields  CN III, IV, VI PEERL and EOM intact CN V light touch intact in all 3 divisions of trigeminal nerve CN VII facial nerve movements intact, symmetric CN VIII hearing intact to finger rub CN IX, X no uvula deviation, symmetric soft palate rise CN XI 5/5 SCM and trapezius strength  CN XII Tongue midline with symmetric L/R movement  Skin: Skin is warm and dry. Capillary refill takes less than 2 seconds.  Psychiatric: He has a normal mood and affect. His behavior is normal. Judgment and thought content normal.  Nursing note and vitals reviewed.    ED Treatments / Results  Labs (all labs ordered are listed, but only abnormal results are displayed) Labs Reviewed - No data to display  EKG  EKG Interpretation None       Radiology No results found.  Procedures Procedures (including critical care  time)  Medications Ordered in ED Medications  ketorolac (TORADOL) injection 60 mg (60 mg Intramuscular Given 11/07/16 1324)  prochlorperazine (COMPAZINE) tablet 10 mg (10 mg Oral Given 11/07/16 1324)     Initial Impression / Assessment and Plan / ED Course  I have reviewed the triage vital signs and the nursing notes.  Pertinent labs & imaging results that were available during my care of the patient were reviewed by me and considered in my medical decision making (see chart for details).  Clinical Course as of Nov 08 1506  Tue Nov 07, 2016  1344 Re-evaluated patient. Repeat neuro exam unchanged and normal. HA now a 2/10.  Discussed plan to discharge. Patient and family member at bedside agreeable   [CG]    Clinical Course User Index [CG] Kinnie Feil, PA-C   46 year old male with pertinent past medical history of hypertension, migraine headaches, obesity and seasonal allergies presents with gradual in onset, dull headache and elevated blood pressure reading this morning. Associated symptoms include nasal congestion, eyes, scratchy throat which she attributes to seasonal allergies. Patient was seen by his primary care provider yesterday for headache and sinus drainage for multiple months. PCP recommended seasonal allergy medication, which she has not started taking. On exam vital signs are within normal limits, systolic blood pressure acceptable. An ranging between 1:30 to 140 stomach. I reviewed patient's chart and this seems to be patient's baseline blood pressure. Neurological exam is normal.  No nystagmus, no meningismus, no pain over temporal arteries. Low suspicion for intracranial/neurologic emergency coughing headache. High suspicion for sinus headache secondary to untreated allergic rhinitis.  Patient's states today's headache is "duller" and less severe than his usual migraine headaches.  Patient is without high-risk features of headache including: sudden onset/thunderclap HA,  altered mental status, accompanying seizure, headache with exertion, age > 16, history of immunocompromise, neck stiffness or pain, fever, use of anticoagulation, family history of spontaneous SAH, concomitant drug use or toxic exposure. Imaging with CT/MRI not indicated given history and physical exam findings. No emergent intracranial or vascular pathology suspected or identified by history, physical exam.  No indications for hospitalization identified. Pt given PO compazine and toradol which decreased headache from 4/10 to 2/10. Pt tolerated PO fluids and ambulated in ED without difficulty.   Strict ED return precautions given. Pt will be discharged with symptomatic treatment of headaches.  Pt is aware of red flag symptoms of headaches that would warrant return to ED. Pt verbalized understanding  and is agreeable to discharge plan. Advised patient to take OTC allergy medications. Pt and wife at bedside agreeable.   Patient, ED treatment and discharge plan was discussed with supervising physician who is agreeable with plan.   Final Clinical Impressions(s) / ED Diagnoses   Final diagnoses:  Bad headache  Elevated blood pressure reading with diagnosis of hypertension    New Prescriptions Discharge Medication List as of 11/07/2016  1:49 PM       Kinnie Feil, PA-C 11/07/16 [place], [place], DO 11/08/16 [phone]

## 2016-11-07 NOTE — Discharge Instructions (Signed)
Your headache is most likely due to uncontrolled seasonal allergies. As we discussed your neurological exam today was normal. Your blood pressure was within normal limits here in the emergency department. Your headache improved during observation here in the emergency department and after oral migraine "cocktail".  It is possible that your seasonal allergies and nasal congestion may be contributing to your headache. Please take over-the-counter allergy medications including Allegra and/or Flonase to help with your symptoms. Continue taking your blood pressure medication. Continue monitoring your blood pressure at home.  Return to the emergency department if you develop severe, sudden onset, "worst headache of your life" associated with nausea, vomiting, visual disturbances, chest pain, numbness or tingling in your upper and lower extremities.

## 2016-11-07 NOTE — ED Notes (Signed)
Pt states not typical migraine. Pt states now feels pressure with ears BIL " like flying in airplane" Pt c/o of nasal congestion x 2 months. Pt saw Dr Nyoka Cowden yesterday for headache. PCP states DX allergies. Pt bought allegra yesterday however has not taken.

## 2016-12-07 ENCOUNTER — Other Ambulatory Visit: Payer: Self-pay | Admitting: Internal Medicine

## 2016-12-07 DIAGNOSIS — K3184 Gastroparesis: Secondary | ICD-10-CM

## 2017-01-04 ENCOUNTER — Other Ambulatory Visit: Payer: Self-pay | Admitting: Family Medicine

## 2017-01-04 DIAGNOSIS — I10 Essential (primary) hypertension: Secondary | ICD-10-CM

## 2017-01-08 ENCOUNTER — Telehealth: Payer: Self-pay | Admitting: Adult Health

## 2017-01-08 MED ORDER — PREDNISONE 5 MG PO TABS: ORAL_TABLET | ORAL | 0 refills | Status: DC

## 2017-01-08 NOTE — Telephone Encounter (Signed)
We can try a prednisone dosepak. If this is not helpful he should let us know. Order sent.

## 2017-01-08 NOTE — Telephone Encounter (Signed)
Patient's wife calling. Patient has had a headache x 3 days and would like to be seen today. rizatriptan (MAXALT-MLT) 10 MG disintegrating tablet is not helping.

## 2017-01-08 NOTE — Addendum Note (Signed)
Addended by: Trudie Buckler on: 01/08/2017 10:09 AM   Modules accepted: Orders

## 2017-01-08 NOTE — Telephone Encounter (Signed)
Spoke with wife for more details. She got husband on speaker phone. Spoke with patient who denied nausea, vomiting, light sensitivity, sound sensitivity. He stated he did have some light sensitivity the first day of headache, 3 days ago. He reported the headache is located in both his temples and the back of his skull. He stated his headache is dull today; he did not go into work. He has not taken Prednisone dose pack for headache in past but stated he is willing to try a short course of dose pack. He has been on prolonged steroids in past for Crohn's disease. Patient confirmed pharmacy [facility] [address]. His wife asked about a letter for his missed day of work today. This RN advised that he wait to be sure he can return to work tomorrow before requesting a letter. Wife and husband verbalized understanding, appreciation of call.

## 2017-01-13 ENCOUNTER — Ambulatory Visit (INDEPENDENT_AMBULATORY_CARE_PROVIDER_SITE_OTHER): Payer: Managed Care, Other (non HMO) | Admitting: Family Medicine

## 2017-01-13 ENCOUNTER — Encounter: Payer: Self-pay | Admitting: Family Medicine

## 2017-01-13 VITALS — BP 138/87 | HR 85 | Temp 98.3°F | Resp 16 | Ht 72.0 in | Wt 290.0 lb

## 2017-01-13 DIAGNOSIS — Z720 Tobacco use: Secondary | ICD-10-CM | POA: Diagnosis not present

## 2017-01-13 DIAGNOSIS — I10 Essential (primary) hypertension: Secondary | ICD-10-CM | POA: Diagnosis not present

## 2017-01-13 DIAGNOSIS — E785 Hyperlipidemia, unspecified: Secondary | ICD-10-CM

## 2017-01-13 DIAGNOSIS — R351 Nocturia: Secondary | ICD-10-CM | POA: Diagnosis not present

## 2017-01-13 MED ORDER — VERAPAMIL HCL ER 180 MG PO TBCR: 360.0000 mg | EXTENDED_RELEASE_TABLET | Freq: Every day | ORAL | 1 refills | Status: DC

## 2017-01-13 MED ORDER — DOXAZOSIN MESYLATE 2 MG PO TABS: 2.0000 mg | ORAL_TABLET | Freq: Every day | ORAL | 1 refills | Status: DC

## 2017-01-13 MED ORDER — LISINOPRIL 40 MG PO TABS: 40.0000 mg | ORAL_TABLET | Freq: Every day | ORAL | 1 refills | Status: DC

## 2017-01-13 NOTE — Progress Notes (Signed)
By signing my name below, I, Mesha Guinyard, attest that this documentation has been prepared under the direction and in the presence of Merri Ray, MD.  Electronically Signed: Verlee Monte, Medical Scribe. 01/13/17. 4:14 PM.  Subjective:    Patient ID: Craig Guzman, male    DOB: 1970/12/24, 46 y.o.   MRN: [id number]  HPI Chief Complaint  Patient presents with  . Hypertension  . COPD    HPI Comments: Craig Guzman is a 46 y.o. male who presents to Primary Care at [facility] for follow-up.  Pt last at at 12:30pm, not fasting today.   HTN: The last visit with me was May 7th, bp was 138/84 at that time. He takes cardura 2 mg QD, lisinopril 40 mg QD, and verapamil 180 mg BID. Pt had high bp last week, and over the weekend it was running 185/100 while he was on prednisone and his pulse was so heavy it was moving his wifes finger. Pt suspects his cuff is too small. When he used a wrist cuff his bp was 128/82? Denies chest pain, SOB, light-headedness, dizziness, or other acute side effects.  Lab Results  Component Value Date   CREATININE 0.97 07/04/2016   BP Readings from Last 3 Encounters:  01/13/17 138/87  11/07/16 133/73  11/06/16 138/84   Constipation: Pt didn't have a bowel movement for 2 days and after using the bathroom his was beltching smelled like propane gas. Better now.   Tobacco Abuse: Pt smokes 10 cigarettes a day - no change since Jan. Pt tried chantix at night, but doesn't like the side effects. Patches slide off of him because he sweats so much. Pt doesn't want to try the gum because he swallows gum.   Migraine HAs: followed by neurology. Seen by neuro April 9th. Usual migraines respond to maxalt and flexeril. Based on notes from that visit, HA approx every other week, but only 1 migraine from Oct - April. He was seen in the ER May 8th for HA and elevated bp, 154/113 on outside measurement. He was treated with compazine and toradol with slight improvement in sxs. Phone  note with neurology on July 9th, 3 days of HA without improvement with maxalt - prednisone dose pack rx. His HA has improved and describes it was a "nagging ache".  HLD: Pt is not taking a cholesterol medication. Borderline elevated levels prior.  Lab Results  Component Value Date   CHOL 152 06/24/2016   HDL 31 (L) 06/24/2016   LDLCALC 106 (H) 06/24/2016   TRIG 77 06/24/2016   CHOLHDL 4.9 06/24/2016    Patient Active Problem List   Diagnosis Date Noted  . Acute pancreatitis 06/24/2016  . Cigarette nicotine dependence without complication 29/52/8413  . Shifting sleep-work schedule, affecting sleep 05/15/2016  . Sleep deprivation 05/15/2016  . Sleep related hypoventilation in conditions classified elsewhere 05/15/2016  . Sleep related headaches 05/15/2016  . Morbid obesity ([place]) 09/19/2013  . Diarrhea 07/10/2013  . 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 Hypertension 11/26/2012  . Gastroparesis??? 12/08/2011  . Common migraine without aura 12/08/2011  . Long-term use of immunosuppressant medication- Cimzia 08/08/2011  . Crohn's disease of stomach and colon-suspected 05/17/2011  . GERD (gastroesophageal reflux disease) 04/21/2011   Past Medical History:  Diagnosis Date  . Allergy    trees/ grasses/ animals/dust/mold  . Anxiety   . Biliary dyskinesia   . Colloid thyroid nodule   . Crohn's disease ([place])    Stomach, terminal ileum, cecum  . Gastric ulcer  antral  . GERD (gastroesophageal reflux disease)   . History of migraine headaches   . HTN (hypertension)   . Kidney stone 09/20/2012  . Migraine   . Obesity   . Pneumonia 02/27/2014   ED   Past Surgical History:  Procedure Laterality Date  . CHOLECYSTECTOMY  2011   [place]  . COLONOSCOPY  07/26/11   Crohn's colitis, ileitis  . ESOPHAGOGASTRODUODENOSCOPY  05/04/11, 07/26/11   granulomatous gastritis - Crohn's  . FLEXIBLE SIGMOIDOSCOPY    . JOINT REPLACEMENT    . MULTIPLE TOOTH EXTRACTIONS  2005  . SHOULDER SURGERY      right  . VASECTOMY     bilateral w/lysis of penile adhesions   Allergies  Allergen Reactions  . Zithromax [Azithromycin] Hypertension   Prior to Admission medications   Medication Sig Start Date End Date Taking? Authorizing Provider  Adalimumab (HUMIRA PEN) 40 MG/0.8ML PNKT Inject 40 mg into the skin every 14 (fourteen) days. After completion of the starter 06/06/16  Yes Gatha Mayer, MD  cyclobenzaprine (FLEXERIL) 10 MG tablet Take 1 tablet (10 mg total) by mouth 3 (three) times daily as needed (for migraines). 10/09/16  Yes Ward Givens, NP  doxazosin (CARDURA) 2 MG tablet Take 1 tablet (2 mg total) by mouth daily. 11/06/16  Yes [name], MD  lisinopril (PRINIVIL,ZESTRIL) 40 MG tablet Take 1 tablet (40 mg total) by mouth daily. 07/04/16  Yes [name], MD  omeprazole-sodium bicarbonate (ZEGERID) 40-1100 MG capsule Take 1 capsule by mouth daily with supper.   Yes [provider]  predniSONE (DELTASONE) 5 MG tablet Begin taking 6 tablets daily, taper by one tablet daily until off the medication. 01/08/17  Yes Ward Givens, NP  prochlorperazine (COMPAZINE) 25 MG suppository Place 25 mg rectally every 12 (twelve) hours as needed for nausea or vomiting.   Yes [provider]  rizatriptan (MAXALT-MLT) 10 MG disintegrating tablet Take 1 tablet (10 mg total) by mouth 3 (three) times daily as needed for migraine. 10/09/16  Yes Ward Givens, NP  verapamil (CALAN-SR) 180 MG CR tablet Take 2 tablets (360 mg total) by mouth daily. 07/04/16  Yes [name], MD   Social History   Social History  . Marital status: Married    Spouse name: N/A  . Number of children: 3  . Years of education: Some college   Occupational History  . [employer]    Social History Main Topics  . Smoking status: Current Every Day Smoker    Packs/day: 0.50    Years: 21.00    Types: Cigarettes  . Smokeless tobacco: Former Systems developer    Types: Snuff     Comment: Counseling sheet given  in exam room   . Alcohol use No  . Drug use: No  . Sexual activity: Yes   Other Topics Concern  . Not on file   Social History Narrative   Married. Education: [employer].    Lives at home w/ his wife and kids   Right-handed   Caffeine: 3 sodas per day   Review of Systems  Constitutional: Negative for fatigue and unexpected weight change.  Eyes: Negative for visual disturbance.  Respiratory: Negative for cough, chest tightness and shortness of breath.   Cardiovascular: Negative for chest pain, palpitations and leg swelling.  Gastrointestinal: Positive for constipation. Negative for abdominal pain and blood in stool.  Neurological: Positive for headaches (chronic). Negative for dizziness and light-headedness.   Objective:  Physical Exam  Constitutional: He appears  well-developed and well-nourished. No distress.  HENT:  Head: Normocephalic and atraumatic.  Eyes: Conjunctivae are normal.  Neck: Neck supple.  Cardiovascular: Normal rate.   Pulmonary/Chest: Effort normal.  Neurological: He is alert.  Skin: Skin is warm and dry.  Psychiatric: He has a normal mood and affect. His behavior is normal.  Nursing note and vitals reviewed.   Vitals:   01/13/17 1516  BP: 138/87  Pulse: 85  Resp: 16  Temp: 98.3 F (36.8 C)  TempSrc: Oral  SpO2: 99%  Weight: 290 lb (131.5 kg)  Height: 6' (1.829 m)  Body mass index is 39.33 kg/m. Assessment & Plan:    Craig Guzman is a 46 y.o. male Essential hypertension - Plan: doxazosin (CARDURA) 2 MG tablet, verapamil (CALAN-SR) 180 MG CR tablet, lisinopril (PRINIVIL,ZESTRIL) 40 MG tablet, Comprehensive metabolic panel, Lipid panel  - Near control in office. Possible too small of cuff when checked at home. Prednisone and headache/pain can also increase pressure.  - Continue same regimen for now, monitor BP outside of office, and if elevated return with home BP cuff to make sure it is obtaining accurate readings. He does plan on bringing BP cuff  to office next week for lab only visit. Did advise him that if his blood pressure was elevated, it may be recommended to check in as a patient at that time.  Tobacco abuse  - Cessation discussed, has cut back on total number of cigarettes per day. Resources provided.  Hyperlipidemia, unspecified hyperlipidemia type - Plan: Lipid panel  - Mildly elevated LDL, low HDL previously. Return for fasting labs next week, as not fasting at today's visit  Meds ordered this encounter  Medications  . doxazosin (CARDURA) 2 MG tablet    Sig: Take 1 tablet (2 mg total) by mouth daily.    Dispense:  90 tablet    Refill:  1  . verapamil (CALAN-SR) 180 MG CR tablet    Sig: Take 2 tablets (360 mg total) by mouth daily.    Dispense:  180 tablet    Refill:  1  . lisinopril (PRINIVIL,ZESTRIL) 40 MG tablet    Sig: Take 1 tablet (40 mg total) by mouth daily.    Dispense:  90 tablet    Refill:  1   Patient Instructions   Currently blood pressure looks ok.  Prednisone can increase blood pressure some, but should not be that large of an increase. See info on checking blood pressure and make sure the cuff is the right size. No change in meds for now, but if readings remain over 140/90 at home, then return with your machine to verify accurate readings and possible med changes.   Continue follow up with neurology for headaches.   [place] offers smoking cessation clinics. Registration is required. To register call [phone] or register online at [email or web address] They may be able to discuss other options besides Chantix, patches or gum.   Return for fasting bloodwork next Saturday morning. Lab only visit is ok.   Recheck in next 6 months for physical.   How to Take Your Blood Pressure Blood pressure is a measurement of how strongly your blood is pressing against the walls of your arteries. Arteries are blood vessels that carry blood from your heart throughout your body. Your health care provider takes your  blood pressure at each office visit. You can also take your own blood pressure at home with a blood pressure machine. You may need to take your own blood pressure:  To confirm a diagnosis of high blood pressure (hypertension).  To monitor your blood pressure over time.  To make sure your blood pressure medicine is working.  Supplies needed: To take your blood pressure, you will need a blood pressure machine. You can buy a blood pressure machine, or blood pressure monitor, at most drugstores or online. There are several types of home blood pressure monitors. When choosing one, consider the following:  Choose a monitor that has an arm cuff.  Choose a monitor that wraps snugly around your upper arm. You should be able to fit only one finger between your arm and the cuff.  Do not choose a monitor that measures your blood pressure from your wrist or finger.  Your health care provider can suggest a reliable monitor that will meet your needs. How to prepare To get the most accurate reading, avoid the following for 30 minutes before you check your blood pressure:  Drinking caffeine.  Drinking alcohol.  Eating.  Smoking.  Exercising.  Five minutes before you check your blood pressure:  Empty your bladder.  Sit quietly without talking in a dining chair, rather than in a soft couch or armchair.  How to take your blood pressure To check your blood pressure, follow the instructions in the manual that came with your blood pressure monitor. If you have a digital blood pressure monitor, the instructions may be as follows: 1. Sit up straight. 2. Place your feet on the floor. Do not cross your ankles or legs. 3. Rest your left arm at the level of your heart on a table or desk or on the arm of a chair. 4. Pull up your shirt sleeve. 5. Wrap the blood pressure cuff around the upper part of your left arm, 1 inch (2.5 cm) above your elbow. It is best to wrap the cuff around bare skin. 6. Fit the  cuff snugly around your arm. You should be able to place only one finger between the cuff and your arm. 7. Position the cord inside the groove of your elbow. 8. Press the power button. 9. Sit quietly while the cuff inflates and deflates. 10. Read the digital reading on the monitor screen and write it down (record it). 11. Wait 2-3 minutes, then repeat the steps, starting at step 1.  What does my blood pressure reading mean? A blood pressure reading consists of a higher number over a lower number. Ideally, your blood pressure should be below 120/80. The first ("top") number is called the systolic pressure. It is a measure of the pressure in your arteries as your heart beats. The second ("bottom") number is called the diastolic pressure. It is a measure of the pressure in your arteries as the heart relaxes. Blood pressure is classified into four stages. The following are the stages for adults who do not have a short-term serious illness or a chronic condition. Systolic pressure and diastolic pressure are measured in a unit called mm Hg. Normal  Systolic pressure: below 725.  Diastolic pressure: below 80. Elevated  Systolic pressure: 366-440.  Diastolic pressure: below 80. Hypertension stage 1  Systolic pressure: 347-425.  Diastolic pressure: 95-63. Hypertension stage 2  Systolic pressure: 875 or above.  Diastolic pressure: 90 or above. You can have prehypertension or hypertension even if only the systolic or only the diastolic number in your reading is higher than normal. Follow these instructions at home:  Check your blood pressure as often as recommended by your health care provider.  Take your  monitor to the next appointment with your health care provider to make sure: ? That you are using it correctly. ? That it provides accurate readings.  Be sure you understand what your goal blood pressure numbers are.  Tell your health care provider if you are having any side effects  from blood pressure medicine. Contact a health care provider if:  Your blood pressure is consistently high. Get help right away if:  Your systolic blood pressure is higher than 180.  Your diastolic blood pressure is higher than 110. This information is not intended to replace advice given to you by your health care provider. Make sure you discuss any questions you have with your health care provider. Document Released: 11/26/2015 Document Revised: 02/08/2016 Document Reviewed: 11/26/2015 Elsevier Interactive Patient Education  2018 [employer].    IF you received an x-ray today, you will receive an invoice from [facility]. Please contact [facility] at [phone] with questions or concerns regarding your invoice.   IF you received labwork today, you will receive an invoice from [facility]. Please contact [facility] at [phone] with questions or concerns regarding your invoice.   Our billing staff will not be able to assist you with questions regarding bills from these companies.  You will be contacted with the lab results as soon as they are available. The fastest way to get your results is to activate your My Chart account. Instructions are located on the last page of this paperwork. If you have not heard from [place] regarding the results in 2 weeks, please contact this office.       I personally performed the services described in this documentation, which was scribed in my presence. The recorded information has been reviewed and considered for accuracy and completeness, addended by me as needed, and agree with information above.  Signed,   Merri Ray, MD Primary [facility] at [facility].  01/13/17 4:36 PM

## 2017-01-13 NOTE — Patient Instructions (Addendum)
Currently blood pressure looks ok.  Prednisone can increase blood pressure some, but should not be that large of an increase. See info on checking blood pressure and make sure the cuff is the right size. No change in meds for now, but if readings remain over 140/90 at home, then return with your machine to verify accurate readings and possible med changes.   Continue follow up with neurology for headaches.   [place] offers smoking cessation clinics. Registration is required. To register call [phone] or register online at [email or web address] They may be able to discuss other options besides Chantix, patches or gum.   Return for fasting bloodwork next Saturday morning. Lab only visit is ok.   Recheck in next 6 months for physical.   How to Take Your Blood Pressure Blood pressure is a measurement of how strongly your blood is pressing against the walls of your arteries. Arteries are blood vessels that carry blood from your heart throughout your body. Your health care provider takes your blood pressure at each office visit. You can also take your own blood pressure at home with a blood pressure machine. You may need to take your own blood pressure:  To confirm a diagnosis of high blood pressure (hypertension).  To monitor your blood pressure over time.  To make sure your blood pressure medicine is working.  Supplies needed: To take your blood pressure, you will need a blood pressure machine. You can buy a blood pressure machine, or blood pressure monitor, at most drugstores or online. There are several types of home blood pressure monitors. When choosing one, consider the following:  Choose a monitor that has an arm cuff.  Choose a monitor that wraps snugly around your upper arm. You should be able to fit only one finger between your arm and the cuff.  Do not choose a monitor that measures your blood pressure from your wrist or finger.  Your health care provider can suggest a reliable  monitor that will meet your needs. How to prepare To get the most accurate reading, avoid the following for 30 minutes before you check your blood pressure:  Drinking caffeine.  Drinking alcohol.  Eating.  Smoking.  Exercising.  Five minutes before you check your blood pressure:  Empty your bladder.  Sit quietly without talking in a dining chair, rather than in a soft couch or armchair.  How to take your blood pressure To check your blood pressure, follow the instructions in the manual that came with your blood pressure monitor. If you have a digital blood pressure monitor, the instructions may be as follows: 1. Sit up straight. 2. Place your feet on the floor. Do not cross your ankles or legs. 3. Rest your left arm at the level of your heart on a table or desk or on the arm of a chair. 4. Pull up your shirt sleeve. 5. Wrap the blood pressure cuff around the upper part of your left arm, 1 inch (2.5 cm) above your elbow. It is best to wrap the cuff around bare skin. 6. Fit the cuff snugly around your arm. You should be able to place only one finger between the cuff and your arm. 7. Position the cord inside the groove of your elbow. 8. Press the power button. 9. Sit quietly while the cuff inflates and deflates. 10. Read the digital reading on the monitor screen and write it down (record it). 11. Wait 2-3 minutes, then repeat the steps, starting at step 1.  What  does my blood pressure reading mean? A blood pressure reading consists of a higher number over a lower number. Ideally, your blood pressure should be below 120/80. The first ("top") number is called the systolic pressure. It is a measure of the pressure in your arteries as your heart beats. The second ("bottom") number is called the diastolic pressure. It is a measure of the pressure in your arteries as the heart relaxes. Blood pressure is classified into four stages. The following are the stages for adults who do not have a  short-term serious illness or a chronic condition. Systolic pressure and diastolic pressure are measured in a unit called mm Hg. Normal  Systolic pressure: below 834.  Diastolic pressure: below 80. Elevated  Systolic pressure: 621-947.  Diastolic pressure: below 80. Hypertension stage 1  Systolic pressure: 125-271.  Diastolic pressure: 29-29. Hypertension stage 2  Systolic pressure: 090 or above.  Diastolic pressure: 90 or above. You can have prehypertension or hypertension even if only the systolic or only the diastolic number in your reading is higher than normal. Follow these instructions at home:  Check your blood pressure as often as recommended by your health care provider.  Take your monitor to the next appointment with your health care provider to make sure: ? That you are using it correctly. ? That it provides accurate readings.  Be sure you understand what your goal blood pressure numbers are.  Tell your health care provider if you are having any side effects from blood pressure medicine. Contact a health care provider if:  Your blood pressure is consistently high. Get help right away if:  Your systolic blood pressure is higher than 180.  Your diastolic blood pressure is higher than 110. This information is not intended to replace advice given to you by your health care provider. Make sure you discuss any questions you have with your health care provider. Document Released: 11/26/2015 Document Revised: 02/08/2016 Document Reviewed: 11/26/2015 Elsevier Interactive Patient Education  2018 [employer].    IF you received an x-ray today, you will receive an invoice from [facility] Rad[facility]. Please contact [facility] at [phone] with questions or concerns regarding your invoice.   IF you received labwork today, you will receive an invoice from [facility]. Please contact [facility] at [phone] with questions or concerns regarding your invoice.    Our billing staff will not be able to assist you with questions regarding bills from these companies.  You will be contacted with the lab results as soon as they are available. The fastest way to get your results is to activate your My Chart account. Instructions are located on the last page of this paperwork. If you have not heard from [place] regarding the results in 2 weeks, please contact this office.

## 2017-01-18 ENCOUNTER — Ambulatory Visit: Payer: Managed Care, Other (non HMO) | Admitting: Physician Assistant

## 2017-01-18 DIAGNOSIS — E785 Hyperlipidemia, unspecified: Secondary | ICD-10-CM

## 2017-01-18 DIAGNOSIS — I10 Essential (primary) hypertension: Secondary | ICD-10-CM

## 2017-01-18 LAB — LIPID PANEL
CHOL/HDL RATIO: 4.7 ratio (ref 0.0–5.0)
CHOLESTEROL TOTAL: 160 mg/dL (ref 100–199)
HDL: 34 mg/dL — ABNORMAL LOW (ref >39)
LDL CALC: 99 mg/dL (ref 0–99)
TRIGLYCERIDES: 133 mg/dL (ref 0–149)
VLDL Cholesterol Cal: 27 mg/dL (ref 5–40)

## 2017-01-18 LAB — COMPREHENSIVE METABOLIC PANEL
A/G RATIO: 1.1 — AB (ref 1.2–2.2)
ALT: 60 IU/L — AB (ref 0–44)
AST: 41 IU/L — AB (ref 0–40)
Albumin: 3.9 g/dL (ref 3.5–5.5)
Alkaline Phosphatase: 164 IU/L — ABNORMAL HIGH (ref 39–117)
BILIRUBIN TOTAL: 0.5 mg/dL (ref 0.0–1.2)
BUN/Creatinine Ratio: 11 (ref 9–20)
BUN: 11 mg/dL (ref 6–24)
CALCIUM: 8.7 mg/dL (ref 8.7–10.2)
CO2: 19 mmol/L — ABNORMAL LOW (ref 20–29)
Chloride: 105 mmol/L (ref 96–106)
Creatinine, Ser: 0.96 mg/dL (ref 0.76–1.27)
GFR calc Af Amer: 110 mL/min/{1.73_m2} (ref >59)
GFR, EST NON AFRICAN AMERICAN: 95 mL/min/{1.73_m2} (ref >59)
Globulin, Total: 3.4 g/dL (ref 1.5–4.5)
Glucose: 94 mg/dL (ref 65–99)
POTASSIUM: 4.1 mmol/L (ref 3.5–5.2)
SODIUM: 139 mmol/L (ref 134–144)
Total Protein: 7.3 g/dL (ref 6.0–8.5)

## 2017-01-18 NOTE — Progress Notes (Signed)
Patient here for labs only. Philis Fendt, MS, PA-C 9:52 AM, 01/18/2017

## 2017-02-08 ENCOUNTER — Encounter: Payer: Self-pay | Admitting: Family Medicine

## 2017-02-08 ENCOUNTER — Ambulatory Visit (INDEPENDENT_AMBULATORY_CARE_PROVIDER_SITE_OTHER): Payer: Managed Care, Other (non HMO) | Admitting: Family Medicine

## 2017-02-08 VITALS — BP 132/88 | HR 72 | Temp 98.3°F | Resp 16 | Ht 72.0 in | Wt 294.0 lb

## 2017-02-08 DIAGNOSIS — Z5181 Encounter for therapeutic drug level monitoring: Secondary | ICD-10-CM

## 2017-02-08 DIAGNOSIS — R7989 Other specified abnormal findings of blood chemistry: Secondary | ICD-10-CM

## 2017-02-08 DIAGNOSIS — R945 Abnormal results of liver function studies: Principal | ICD-10-CM

## 2017-02-08 NOTE — Progress Notes (Signed)
By signing my name below, I, Mesha Guinyard, attest that this documentation has been prepared under the direction and in the presence of Merri Ray, MD.  Electronically Signed: Verlee Monte, Medical Scribe. 02/08/17. 4:05 PM.  Subjective:    Patient ID: Craig Guzman, male    DOB: [birth year], 46 y.o.   MRN: [id number]  HPI Chief Complaint  Patient presents with  . Abnormal Labs    HPI Comments: Craig Guzman is a 46 y.o. male who presents to Primary Care at [facility] for 3 month follow-up. He was seen most recently on July 14th for HTN. BP was borderline but remained on same medication regimen. He had lab worked performed July 19th, lipid panel overall reassuring, but did have some slightly elevated LFTs. Those were nl in Dec 2017. He did have a CT of his abdomen in 2016 without focal liver lesions, gallbladder was absent. He is on humira, other prescription medication. Pt is fasting.  Pt recently had abdominal pain after drinking pink lemonade last night, but otherwise hasn't had abdominal pain. Pt was trying to lose weight, but has gained since taking prednisone. Pt drank 1 beer last month.  No regular alcohol use. Pt has not been taking tylenol, or pain medications with acetaminophen. Denies weight loss, and night sweats.  Lab Results  Component Value Date   ALT 60 (H) 01/18/2017   AST 41 (H) 01/18/2017   ALKPHOS 164 (H) 01/18/2017   BILITOT 0.5 01/18/2017   Lab Results  Component Value Date   CHOL 160 01/18/2017   HDL 34 (L) 01/18/2017   LDLCALC 99 01/18/2017   TRIG 133 01/18/2017   CHOLHDL 4.7 01/18/2017    Patient Active Problem List   Diagnosis Date Noted  . Acute pancreatitis 06/24/2016  . Cigarette nicotine dependence without complication 93/57/0177  . Shifting sleep-work schedule, affecting sleep 05/15/2016  . Sleep deprivation 05/15/2016  . Sleep related hypoventilation in conditions classified elsewhere 05/15/2016  . Sleep related headaches 05/15/2016  .  Morbid obesity ([place]) 09/19/2013  . Diarrhea 07/10/2013  . 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 Hypertension 11/26/2012  . Gastroparesis??? 12/08/2011  . Common migraine without aura 12/08/2011  . Long-term use of immunosuppressant medication- Cimzia 08/08/2011  . Crohn's disease of stomach and colon-suspected 05/17/2011  . GERD (gastroesophageal reflux disease) 04/21/2011   Past Medical History:  Diagnosis Date  . Allergy    trees/ grasses/ animals/dust/mold  . Anxiety   . Biliary dyskinesia   . Colloid thyroid nodule   . Crohn's disease ([place])    Stomach, terminal ileum, cecum  . Gastric ulcer    antral  . GERD (gastroesophageal reflux disease)   . History of migraine headaches   . HTN (hypertension)   . Kidney stone 09/20/2012  . Migraine   . Obesity   . Pneumonia 02/27/2014   ED   Past Surgical History:  Procedure Laterality Date  . CHOLECYSTECTOMY  2011   [place]  . COLONOSCOPY  07/26/11   Crohn's colitis, ileitis  . ESOPHAGOGASTRODUODENOSCOPY  05/04/11, 07/26/11   granulomatous gastritis - Crohn's  . FLEXIBLE SIGMOIDOSCOPY    . JOINT REPLACEMENT    . MULTIPLE TOOTH EXTRACTIONS  2005  . SHOULDER SURGERY     right  . VASECTOMY     bilateral w/lysis of penile adhesions   Allergies  Allergen Reactions  . Zithromax [Azithromycin] Hypertension   Prior to Admission medications   Medication Sig Start Date End Date Taking? Authorizing Provider  Adalimumab (HUMIRA PEN) 40 MG/0.8ML  PNKT Inject 40 mg into the skin every 14 (fourteen) days. After completion of the starter 06/06/16  Yes Gatha Mayer, MD  cyclobenzaprine (FLEXERIL) 10 MG tablet Take 1 tablet (10 mg total) by mouth 3 (three) times daily as needed (for migraines). 10/09/16  Yes Ward Givens, NP  doxazosin (CARDURA) 2 MG tablet Take 1 tablet (2 mg total) by mouth daily. 01/13/17  Yes [name], MD  lisinopril (PRINIVIL,ZESTRIL) 40 MG tablet Take 1 tablet (40 mg total) by mouth daily. 01/13/17  Yes  [name], MD  omeprazole-sodium bicarbonate (ZEGERID) 40-1100 MG capsule Take 1 capsule by mouth daily with supper.   Yes [provider]  prochlorperazine (COMPAZINE) 25 MG suppository Place 25 mg rectally every 12 (twelve) hours as needed for nausea or vomiting.   Yes [provider]  rizatriptan (MAXALT-MLT) 10 MG disintegrating tablet Take 1 tablet (10 mg total) by mouth 3 (three) times daily as needed for migraine. 10/09/16  Yes Ward Givens, NP  verapamil (CALAN-SR) 180 MG CR tablet Take 2 tablets (360 mg total) by mouth daily. 01/13/17  Yes [name], MD  predniSONE (DELTASONE) 5 MG tablet Begin taking 6 tablets daily, taper by one tablet daily until off the medication. Patient not taking: Reported on 02/08/2017 01/08/17   Ward Givens, NP   Social History   Social History  . Marital status: Married    Spouse name: N/A  . Number of children: 3  . Years of education: Some college   Occupational History  . [employer]    Social History Main Topics  . Smoking status: Current Every Day Smoker    Packs/day: 0.50    Years: 21.00    Types: Cigarettes  . Smokeless tobacco: Former Systems developer    Types: Snuff     Comment: Counseling sheet given in exam room   . Alcohol use No  . Drug use: No  . Sexual activity: Yes   Other Topics Concern  . Not on file   Social History Narrative   Married. Education: [employer].    Lives at home w/ his wife and kids   Right-handed   Caffeine: 3 sodas per day   Review of Systems  Constitutional: Negative for diaphoresis and unexpected weight change.  Gastrointestinal: Positive for abdominal pain.   Objective:  Physical Exam  Constitutional: He appears well-developed and well-nourished. No distress.  HENT:  Head: Normocephalic and atraumatic.  Eyes: Conjunctivae are normal.  Neck: Neck supple.  Cardiovascular: Normal rate, regular rhythm and normal heart sounds.  Exam reveals no gallop and no friction rub.   No  murmur heard. Pulmonary/Chest: Effort normal and breath sounds normal. No respiratory distress. He has no wheezes. He has no rales.  Abdominal: Soft. There is no tenderness. There is negative Murphy's sign.  Neurological: He is alert.  Skin: Skin is warm and dry.  Psychiatric: He has a normal mood and affect. His behavior is normal.  Nursing note and vitals reviewed.   Vitals:   02/08/17 1544  BP: 132/88  Pulse: 72  Resp: 16  Temp: 98.3 F (36.8 C)  TempSrc: Oral  SpO2: 97%  Weight: 294 lb (133.4 kg)  Height: 6' (1.829 m)   Body mass index is 39.87 kg/m. Assessment & Plan:  Craig Guzman is a 46 y.o. male Elevated LFTs - Plan: Hepatic function panel  Medication monitoring encounter - Plan: Hepatic function panel  - mildly elevated LFT's normal prior. Prior CT without liver lesions. Asymptomatic.   -  if remains elevated, check acute hep panel. No apparent new meds, tylenol or alcohol cause.   No orders of the defined types were placed in this encounter.  Patient Instructions       IF you received an x-ray today, you will receive an invoice from [facility]. Please contact [facility] at [phone] with questions or concerns regarding your invoice.   IF you received labwork today, you will receive an invoice from [place]. Please contact [facility] at [phone] with questions or concerns regarding your invoice.   Our billing staff will not be able to assist you with questions regarding bills from these companies.  You will be contacted with the lab results as soon as they are available. The fastest way to get your results is to activate your My Chart account. Instructions are located on the last page of this paperwork. If you have not heard from [place] regarding the results in 2 weeks, please contact this office.       I personally performed the services described in this documentation, which was scribed in my presence. The recorded information has been  reviewed and considered for accuracy and completeness, addended by me as needed, and agree with information above.  Signed,   Merri Ray, MD Primary [facility] at [place].  02/08/17 4:13 PM

## 2017-02-08 NOTE — Patient Instructions (Addendum)
I will repeat liver tests. If elevated, may check hepatitis panel or ultrasound of liver. I will let you know. If any new abdominal pain, nausea, or vomiting-return for recheck.    IF you received an x-ray today, you will receive an invoice from [facility]. Please contact [facility] at [phone] with questions or concerns regarding your invoice.   IF you received labwork today, you will receive an invoice from [place]. Please contact [facility] at [phone] with questions or concerns regarding your invoice.   Our billing staff will not be able to assist you with questions regarding bills from these companies.  You will be contacted with the lab results as soon as they are available. The fastest way to get your results is to activate your My Chart account. Instructions are located on the last page of this paperwork. If you have not heard from [place] regarding the results in 2 weeks, please contact this office.

## 2017-02-09 LAB — HEPATIC FUNCTION PANEL
ALK PHOS: 129 IU/L — AB (ref 39–117)
ALT: 24 IU/L (ref 0–44)
AST: 26 IU/L (ref 0–40)
Albumin: 4.1 g/dL (ref 3.5–5.5)
BILIRUBIN, DIRECT: 0.12 mg/dL (ref 0.00–0.40)
Bilirubin Total: 0.4 mg/dL (ref 0.0–1.2)
Total Protein: 7.1 g/dL (ref 6.0–8.5)

## 2017-02-13 ENCOUNTER — Ambulatory Visit (INDEPENDENT_AMBULATORY_CARE_PROVIDER_SITE_OTHER): Payer: Managed Care, Other (non HMO) | Admitting: Family Medicine

## 2017-02-13 ENCOUNTER — Encounter: Payer: Self-pay | Admitting: Family Medicine

## 2017-02-13 VITALS — BP 156/89 | HR 69 | Temp 98.7°F | Resp 16 | Ht 72.0 in | Wt 296.0 lb

## 2017-02-13 DIAGNOSIS — R351 Nocturia: Secondary | ICD-10-CM

## 2017-02-13 DIAGNOSIS — Z6841 Body Mass Index (BMI) 40.0 and over, adult: Secondary | ICD-10-CM | POA: Diagnosis not present

## 2017-02-13 DIAGNOSIS — E669 Obesity, unspecified: Secondary | ICD-10-CM | POA: Diagnosis not present

## 2017-02-13 DIAGNOSIS — Z0000 Encounter for general adult medical examination without abnormal findings: Secondary | ICD-10-CM | POA: Diagnosis not present

## 2017-02-13 DIAGNOSIS — R7989 Other specified abnormal findings of blood chemistry: Secondary | ICD-10-CM | POA: Diagnosis not present

## 2017-02-13 DIAGNOSIS — R945 Abnormal results of liver function studies: Secondary | ICD-10-CM

## 2017-02-13 DIAGNOSIS — I10 Essential (primary) hypertension: Secondary | ICD-10-CM

## 2017-02-13 DIAGNOSIS — Z114 Encounter for screening for human immunodeficiency virus [HIV]: Secondary | ICD-10-CM | POA: Diagnosis not present

## 2017-02-13 DIAGNOSIS — K219 Gastro-esophageal reflux disease without esophagitis: Secondary | ICD-10-CM | POA: Diagnosis not present

## 2017-02-13 DIAGNOSIS — IMO0001 Reserved for inherently not codable concepts without codable children: Secondary | ICD-10-CM

## 2017-02-13 NOTE — Progress Notes (Signed)
Subjective:  By signing my name below, I, Essence Howell, attest that this documentation has been prepared under the direction and in the presence of [name], MD Electronically Signed: Ladene Artist, ED Scribe 02/13/2017 at 5:42 PM.   Patient ID: Craig Guzman, male    DOB: 02-Feb-1971, 46 y.o.   MRN: [id number]  Chief Complaint  Patient presents with  . Annual Exam   HPI Craig Guzman is a 46 y.o. male who presents to Primary Care at [facility] for an annual exam. Pt has a h/o multiple medical problems including HTN, GERD, Crohn's disease, migraine, obesity, tobacco abuse. Pt is followed by Ward Givens, NP for neurology and [name] for GI.  CA Screening Colonoscopy: 2013 followed by flex sig in 2015. Takes Humira for Crohn's colitis.  Prostate CA Screening: Lab Results  Component Value Date   PSA 0.2 03/25/2016   PSA 0.20 09/19/2011   PSA 0.21 09/01/2011   Immunizations Immunization History  Administered Date(s) Administered  . Hepatitis A 09/01/2011, 03/22/2012  . Influenza,inj,Quad PF,36+ Mos 05/01/2013, 04/07/2015, 06/26/2016  . PPD Test 05/19/2011, 12/24/2012, 12/28/2014  . Pneumococcal Polysaccharide-23 09/01/2011  . Tdap 09/01/2011  Shingles: Pt wants to look into getting shingles vaccine since he is on Humira.  HIV Screening: Pt has never had HIV Screening.  Depression Screening Depression screen [facility] 2/9 02/13/2017 02/08/2017 01/13/2017 11/06/2016 07/04/2016  Decreased Interest 0 0 0 0 0  Down, Depressed, Hopeless 0 0 0 0 0  PHQ - 2 Score 0 0 0 0 0     Visual Acuity Screening   Right eye Left eye Both eyes  Without correction:     With correction: 20/20 20/15 20/13    Vision: Last seen in February.  Dentist: Has not seen a dentist within a few years.  Obesity/Exercise: Pt walks 13 flights of stairs twice daily to the fifth floor at work but does not exercise outside of work. He is consuming fast food 1-2 times/week and going out to eat at a restaurant  once/week. Reports having 1-1.5 sodas/week. However, he is only eating once a day which is typically at night other than yogurt, jello, crackers and Herbal Life protein shakes that he adds water to due to emesis. States testing showed gastroparesis and that his sphincter muscle is 14.  Wt Readings from Last 3 Encounters:  02/13/17 296 lb (134.3 kg)  02/08/17 294 lb (133.4 kg)  01/13/17 290 lb (131.5 kg)  Body mass index is 40.14 kg/m.  Lipid Screening  Lab Results  Component Value Date   CHOL 160 01/18/2017   HDL 34 (L) 01/18/2017   LDLCALC 99 01/18/2017   TRIG 133 01/18/2017   CHOLHDL 4.7 01/18/2017   DM Glucose normal ~3 weeks ago.   H/o Elevated LFT Lab Results  Component Value Date   ALT 24 02/08/2017   AST 26 02/08/2017   ALKPHOS 129 (H) 02/08/2017   BILITOT 0.4 02/08/2017  Overall improved based on recent testing with mildly elevated Alkphos.   GERD Still taking Zegerid.   HTN Takes Lisinopril 40 mg qd but reports urinating once every hour, Cardura 2 mg qd and Verapamil 360 mg qd. Does not check his BP at home; states he has to get a new cuff. BP suspects that his BP is elevated today due to rushing from work. BP was controlled at 132/88 at last visit. States frequent urination starts ~9 PM once he lays down and relaxes. Denies daytime frequency, dysuria, hematuria.   Cpap Pt is  no longer using his cpap. States he has tried both types of nose-pieces but still feels as if he can't breathe. States he contacted the sleep specialist and is still awaiting a call back.   Smoking Pt is a current smoker. He has tried Chantix in the past with reported irritability and patches with side-effects of HAs.   Patient Active Problem List   Diagnosis Date Noted  . Acute pancreatitis 06/24/2016  . Cigarette nicotine dependence without complication 05/39/7673  . Shifting sleep-work schedule, affecting sleep 05/15/2016  . Sleep deprivation 05/15/2016  . Sleep related hypoventilation  in conditions classified elsewhere 05/15/2016  . Sleep related headaches 05/15/2016  . Morbid obesity ([place]) 09/19/2013  . Diarrhea 07/10/2013  . 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 Hypertension 11/26/2012  . Gastroparesis??? 12/08/2011  . Common migraine without aura 12/08/2011  . Long-term use of immunosuppressant medication- Cimzia 08/08/2011  . Crohn's disease of stomach and colon-suspected 05/17/2011  . GERD (gastroesophageal reflux disease) 04/21/2011   Past Medical History:  Diagnosis Date  . Allergy    trees/ grasses/ animals/dust/mold  . Anxiety   . Biliary dyskinesia   . Colloid thyroid nodule   . Crohn's disease ([place])    Stomach, terminal ileum, cecum  . Gastric ulcer    antral  . GERD (gastroesophageal reflux disease)   . History of migraine headaches   . HTN (hypertension)   . Kidney stone 09/20/2012  . Migraine   . Obesity   . Pneumonia 02/27/2014   ED   Past Surgical History:  Procedure Laterality Date  . CHOLECYSTECTOMY  2011   [place]  . COLONOSCOPY  07/26/11   Crohn's colitis, ileitis  . ESOPHAGOGASTRODUODENOSCOPY  05/04/11, 07/26/11   granulomatous gastritis - Crohn's  . FLEXIBLE SIGMOIDOSCOPY    . JOINT REPLACEMENT    . MULTIPLE TOOTH EXTRACTIONS  2005  . SHOULDER SURGERY     right  . VASECTOMY     bilateral w/lysis of penile adhesions   Allergies  Allergen Reactions  . Zithromax [Azithromycin] Hypertension   Prior to Admission medications   Medication Sig Start Date End Date Taking? Authorizing Provider  Adalimumab (HUMIRA PEN) 40 MG/0.8ML PNKT Inject 40 mg into the skin every 14 (fourteen) days. After completion of the starter 06/06/16   Gatha Mayer, MD  cyclobenzaprine (FLEXERIL) 10 MG tablet Take 1 tablet (10 mg total) by mouth 3 (three) times daily as needed (for migraines). 10/09/16   Ward Givens, NP  doxazosin (CARDURA) 2 MG tablet Take 1 tablet (2 mg total) by mouth daily. 01/13/17   [name], MD  lisinopril  (PRINIVIL,ZESTRIL) 40 MG tablet Take 1 tablet (40 mg total) by mouth daily. 01/13/17   [name], MD  omeprazole-sodium bicarbonate (ZEGERID) 40-1100 MG capsule Take 1 capsule by mouth daily with supper.    [provider]  prochlorperazine (COMPAZINE) 25 MG suppository Place 25 mg rectally every 12 (twelve) hours as needed for nausea or vomiting.    [provider]  rizatriptan (MAXALT-MLT) 10 MG disintegrating tablet Take 1 tablet (10 mg total) by mouth 3 (three) times daily as needed for migraine. 10/09/16   Ward Givens, NP  verapamil (CALAN-SR) 180 MG CR tablet Take 2 tablets (360 mg total) by mouth daily. 01/13/17   [name], MD   Social History   Social History  . Marital status: Married    Spouse name: N/A  . Number of children: 3  . Years of education: Some college  Occupational History  . [employer]    Social History Main Topics  . Smoking status: Current Every Day Smoker    Packs/day: 0.50    Years: 21.00    Types: Cigarettes  . Smokeless tobacco: Former Systems developer    Types: Snuff     Comment: Counseling sheet given in exam room   . Alcohol use No  . Drug use: No  . Sexual activity: Yes   Other Topics Concern  . Not on file   Social History Narrative   Married. Education: [employer].    Lives at home w/ his wife and kids   Right-handed   Caffeine: 3 sodas per day   Review of Systems  Genitourinary: Positive for frequency. Negative for dysuria and hematuria.      Objective:   Physical Exam  Constitutional: He is oriented to person, place, and time. He appears well-developed and well-nourished.  HENT:  Head: Normocephalic and atraumatic.  Right Ear: External ear normal.  Left Ear: External ear normal.  Mouth/Throat: Oropharynx is clear and moist.  Eyes: Pupils are equal, round, and reactive to light. Conjunctivae and EOM are normal.  Neck: Normal range of motion. Neck supple. No thyromegaly present.  Cardiovascular: Normal rate,  regular rhythm, normal heart sounds and intact distal pulses.   Pulmonary/Chest: Effort normal and breath sounds normal. No respiratory distress. He has no wheezes.  Abdominal: Soft. He exhibits no distension. There is no tenderness.  Musculoskeletal: Normal range of motion. He exhibits no edema or tenderness.  Lymphadenopathy:    He has no cervical adenopathy.  Neurological: He is alert and oriented to person, place, and time. He has normal reflexes.  Skin: Skin is warm and dry.  Psychiatric: He has a normal mood and affect. His behavior is normal.  Vitals reviewed.  Vitals:   02/13/17 1653 02/13/17 1659 02/13/17 1808  BP: (!) 144/94 (!) 148/98 (!) 156/89  Pulse: 75  69  Resp: 16    Temp: 98.7 F (37.1 C)    TempSrc: Oral    SpO2: 98%    Weight: 296 lb (134.3 kg)    Height: 6' (1.829 m)        Assessment & Plan:   Nalu Troublefield is a 46 y.o. male Annual physical exam  - -anticipatory guidance as below in AVS, screening labs above. Health maintenance items as above in HPI discussed/recommended as applicable.   Essential hypertension  - Mildly elevated in office, but most recent visit was normal. Check home readings, same dose of medications for now, but if persistent elevations at home would recommend changes.  -Discussed importance of CPAP use as that also can affect blood pressure readings. Recommend he discuss mask/equipment options with his sleep specialist  Class 3 obesity without serious comorbidity with body mass index (BMI) of 40.0 to 44.9 in adult, unspecified obesity type ([place]) - Plan: Amb ref to Medical Nutrition Therapy-MNT  -Avoidance of sugar containing beverages discussed, more frequent meals may also be beneficial with small portions. This has been difficult previously due to his gastrointestinal issues including some potential gastroparesis.  -refer to nutritionist to discuss options for diet, but may also need to coordinate plan with his  gastroenterologist.  Elevated LFTs  -Improved, alkaline phosphatase near normal now. Other LFTs improved. Can recheck levels in the next few months.  Gastroesophageal reflux disease, esophagitis presence not specified  - As above, may need a nutritionist to discuss recommendations with gastroenterologist. Continue plan and follow-up with [name]  Screening  for HIV (human immunodeficiency virus) - Plan: HIV antibody- routine screening.  Nocturia  -May be related to fluid intake prior to bedtime. Discussed trial of doxazosin before bedtime, to see if that improves symptoms. Additionally untreated sleep apnea may also contribute.  -If persistent symptoms, consider urology evaluation, but most recent PSA was reassuring.  No orders of the defined types were placed in this encounter.  Patient Instructions    Decrease fast food and try to cut back on sodas. Eating smaller meal few times per day may be helpful to stimulate metabolism. You may want to discuss types of foods, and timing with your gastroenterologist.    You can try to change your doxazosin to evening, try to avoid large amount of fluid intake within an hour of bedtime. Send me a message in the next 1-2 weeks if that is not improving nighttime urination and I can refer you to urology. I would also recommend discussing CPAP treatment options with sleep specialist as untreated sleep apnea may be affecting other issues such as blood pressure and possible nighttime symptoms.  Return to the clinic or go to the nearest emergency room if any of your symptoms worsen or new symptoms occur.  [facility] offers smoking cessation clinics. Registration is required. To register call [phone] or register online at [email or web address]  If blood pressure remains over 140/90 outside of office, we need to adjust your medications. Let me know.    Steps to Quit Smoking Smoking tobacco can be harmful to your health and can affect almost every organ  in your body. Smoking puts you, and those around you, at risk for developing many serious chronic diseases. Quitting smoking is difficult, but it is one of the best things that you can do for your health. It is never too late to quit. What are the benefits of quitting smoking? When you quit smoking, you lower your risk of developing serious diseases and conditions, such as:  Lung cancer or lung disease, such as COPD.  Heart disease.  Stroke.  Heart attack.  Infertility.  Osteoporosis and bone fractures.  Additionally, symptoms such as coughing, wheezing, and shortness of breath may get better when you quit. You may also find that you get sick less often because your body is stronger at fighting off colds and infections. If you are pregnant, quitting smoking can help to reduce your chances of having a baby of low birth weight. How do I get ready to quit? When you decide to quit smoking, create a plan to make sure that you are successful. Before you quit:  Pick a date to quit. Set a date within the next two weeks to give you time to prepare.  Write down the reasons why you are quitting. Keep this list in places where you will see it often, such as on your bathroom mirror or in your car or wallet.  Identify the people, places, things, and activities that make you want to smoke (triggers) and avoid them. Make sure to take these actions: ? Throw away all cigarettes at home, at work, and in your car. ? Throw away smoking accessories, such as Scientist, research (medical). ? Clean your car and make sure to empty the ashtray. ? Clean your home, including curtains and carpets.  Tell your family, friends, and coworkers that you are quitting. Support from your loved ones can make quitting easier.  Talk with your health care provider about your options for quitting smoking.  Find out what treatment options are  covered by your health insurance.  What strategies can I use to quit smoking? Talk with  your healthcare provider about different strategies to quit smoking. Some strategies include:  Quitting smoking altogether instead of gradually lessening how much you smoke over a period of time. Research shows that quitting "cold [place]" is more successful than gradually quitting.  Attending in-person counseling to help you build problem-solving skills. You are more likely to have success in quitting if you attend several counseling sessions. Even short sessions of 10 minutes can be effective.  Finding resources and support systems that can help you to quit smoking and remain smoke-free after you quit. These resources are most helpful when you use them often. They can include: ? Online chats with a Social worker. ? Telephone quitlines. ? Careers information officer. ? Support groups or group counseling. ? Text messaging programs. ? Mobile phone applications.  Taking medicines to help you quit smoking. (If you are pregnant or breastfeeding, talk with your health care provider first.) Some medicines contain nicotine and some do not. Both types of medicines help with cravings, but the medicines that include nicotine help to relieve withdrawal symptoms. Your health care provider may recommend: ? Nicotine patches, gum, or lozenges. ? Nicotine inhalers or sprays. ? Non-nicotine medicine that is taken by mouth.  Talk with your health care provider about combining strategies, such as taking medicines while you are also receiving in-person counseling. Using these two strategies together makes you more likely to succeed in quitting than if you used either strategy on its own. If you are pregnant or breastfeeding, talk with your health care provider about finding counseling or other support strategies to quit smoking. Do not take medicine to help you quit smoking unless told to do so by your health care provider. What things can I do to make it easier to quit? Quitting smoking might feel overwhelming at  first, but there is a lot that you can do to make it easier. Take these important actions:  Reach out to your family and friends and ask that they support and encourage you during this time. Call telephone quitlines, reach out to support groups, or work with a counselor for support.  Ask people who smoke to avoid smoking around you.  Avoid places that trigger you to smoke, such as bars, parties, or smoke-break areas at work.  Spend time around people who do not smoke.  Lessen stress in your life, because stress can be a smoking trigger for some people. To lessen stress, try: ? Exercising regularly. ? Deep-breathing exercises. ? Yoga. ? Meditating. ? Performing a body scan. This involves closing your eyes, scanning your body from head to toe, and noticing which parts of your body are particularly tense. Purposefully relax the muscles in those areas.  Download or purchase mobile phone or tablet apps (applications) that can help you stick to your quit plan by providing reminders, tips, and encouragement. There are many free apps, such as QuitGuide from the [employer] Office manager for Disease Control and Prevention). You can find other support for quitting smoking (smoking cessation) through smokefree.gov and other websites.  How will I feel when I quit smoking? Within the first 24 hours of quitting smoking, you may start to feel some withdrawal symptoms. These symptoms are usually most noticeable 2-3 days after quitting, but they usually do not last beyond 2-3 weeks. Changes or symptoms that you might experience include:  Mood swings.  Restlessness, anxiety, or irritation.  Difficulty concentrating.  Dizziness.  Strong cravings for sugary foods in addition to nicotine.  Mild weight gain.  Constipation.  Nausea.  Coughing or a sore throat.  Changes in how your medicines work in your body.  A depressed mood.  Difficulty sleeping (insomnia).  After the first 2-3 weeks of quitting, you  may start to notice more positive results, such as:  Improved sense of smell and taste.  Decreased coughing and sore throat.  Slower heart rate.  Lower blood pressure.  Clearer skin.  The ability to breathe more easily.  Fewer sick days.  Quitting smoking is very challenging for most people. Do not get discouraged if you are not successful the first time. Some people need to make many attempts to quit before they achieve long-term success. Do your best to stick to your quit plan, and talk with your health care provider if you have any questions or concerns. This information is not intended to replace advice given to you by your health care provider. Make sure you discuss any questions you have with your health care provider. Document Released: 06/13/2001 Document Revised: 02/15/2016 Document Reviewed: 11/03/2014 Elsevier Interactive Patient Education  2017 [place] you healthy  Get these tests  Blood pressure- Have your blood pressure checked once a year by your healthcare provider.  Normal blood pressure is 120/80.  Weight- Have your body mass index (BMI) calculated to screen for obesity.  BMI is a measure of body fat based on height and weight. You can also calculate your own BMI at GravelBags.it.  Cholesterol- Have your cholesterol checked regularly starting at age 21, sooner may be necessary if you have diabetes, high blood pressure, if a family member developed heart diseases at an early age or if you smoke.   Chlamydia, HIV, and other sexual transmitted disease- Get screened each year until the age of 45 then within three months of each new sexual partner.  Diabetes- Have your blood sugar checked regularly if you have high blood pressure, high cholesterol, a family history of diabetes or if you are overweight.  Get these vaccines  Flu shot- Every fall.  Tetanus shot- Every 10 years.  Menactra- Single dose; prevents meningitis.  Take these  steps  Don't smoke- If you do smoke, ask your healthcare provider about quitting. For tips on how to quit, go to www.smokefree.gov or call 1-800-QUIT-NOW.  Be physically active- Exercise 5 days a week for at least 30 minutes.  If you are not already physically active start slow and gradually work up to 30 minutes of moderate physical activity.  Examples of moderate activity include walking briskly, mowing the yard, dancing, swimming bicycling, etc.  Eat a healthy diet- Eat a variety of healthy foods such as fruits, vegetables, low fat milk, low fat cheese, yogurt, lean meats, poultry, fish, beans, tofu, etc.  For more information on healthy eating, go to www.thenutritionsource.org  Drink alcohol in moderation- Limit alcohol intake two drinks or less a day.  Never drink and drive.  Dentist- Brush and floss teeth twice daily; visit your dentis twice a year.  Depression-Your emotional health is as important as your physical health.  If you're feeling down, losing interest in things you normally enjoy please talk with your healthcare provider.  Gun Safety- If you keep a gun in your home, keep it unloaded and with the safety lock on.  Bullets should be stored separately.  Helmet use- Always wear a helmet when riding a motorcycle, bicycle, rollerblading or skateboarding.  Safe sex-  If you may be exposed to a sexually transmitted infection, use a condom  Seat belts- Seat bels can save your life; always wear one.  Smoke/Carbon Monoxide detectors- These detectors need to be installed on the appropriate level of your home.  Replace batteries at least once a year.  Skin Cancer- When out in the sun, cover up and use sunscreen SPF 15 or higher.  Violence- If anyone is threatening or hurting you, please tell your healthcare provider.  IF you received an x-ray today, you will receive an invoice from [facility]. Please contact [facility] at [phone] with questions or concerns  regarding your invoice.   IF you received labwork today, you will receive an invoice from [facility]. Please contact [facility] at [phone] with questions or concerns regarding your invoice.   Our billing staff will not be able to assist you with questions regarding bills from these companies.  You will be contacted with the lab results as soon as they are available. The fastest way to get your results is to activate your My Chart account. Instructions are located on the last page of this paperwork. If you have not heard from [place] regarding the results in 2 weeks, please contact this office.       I personally performed the services described in this documentation, which was scribed in my presence. The recorded information has been reviewed and considered for accuracy and completeness, addended by me as needed, and agree with information above.  Signed,   Merri Ray, MD [facility] at [facility].  02/14/17 4:23 PM

## 2017-02-13 NOTE — Patient Instructions (Addendum)
Decrease fast food and try to cut back on sodas. Eating smaller meal few times per day may be helpful to stimulate metabolism. You may want to discuss types of foods, and timing with your gastroenterologist.    You can try to change your doxazosin to evening, try to avoid large amount of fluid intake within an hour of bedtime. Send me a message in the next 1-2 weeks if that is not improving nighttime urination and I can refer you to urology. I would also recommend discussing CPAP treatment options with sleep specialist as untreated sleep apnea may be affecting other issues such as blood pressure and possible nighttime symptoms.  Return to the clinic or go to the nearest emergency room if any of your symptoms worsen or new symptoms occur.  [place] offers smoking cessation clinics. Registration is required. To register call [phone] or register online at [email or web address]  If blood pressure remains over 140/90 outside of office, we need to adjust your medications. Let me know.    Steps to Quit Smoking Smoking tobacco can be harmful to your health and can affect almost every organ in your body. Smoking puts you, and those around you, at risk for developing many serious chronic diseases. Quitting smoking is difficult, but it is one of the best things that you can do for your health. It is never too late to quit. What are the benefits of quitting smoking? When you quit smoking, you lower your risk of developing serious diseases and conditions, such as:  Lung cancer or lung disease, such as COPD.  Heart disease.  Stroke.  Heart attack.  Infertility.  Osteoporosis and bone fractures.  Additionally, symptoms such as coughing, wheezing, and shortness of breath may get better when you quit. You may also find that you get sick less often because your body is stronger at fighting off colds and infections. If you are pregnant, quitting smoking can help to reduce your chances of having a baby  of low birth weight. How do I get ready to quit? When you decide to quit smoking, create a plan to make sure that you are successful. Before you quit:  Pick a date to quit. Set a date within the next two weeks to give you time to prepare.  Write down the reasons why you are quitting. Keep this list in places where you will see it often, such as on your bathroom mirror or in your car or wallet.  Identify the people, places, things, and activities that make you want to smoke (triggers) and avoid them. Make sure to take these actions: ? Throw away all cigarettes at home, at work, and in your car. ? Throw away smoking accessories, such as Scientist, research (medical). ? Clean your car and make sure to empty the ashtray. ? Clean your home, including curtains and carpets.  Tell your family, friends, and coworkers that you are quitting. Support from your loved ones can make quitting easier.  Talk with your health care provider about your options for quitting smoking.  Find out what treatment options are covered by your health insurance.  What strategies can I use to quit smoking? Talk with your healthcare provider about different strategies to quit smoking. Some strategies include:  Quitting smoking altogether instead of gradually lessening how much you smoke over a period of time. Research shows that quitting "cold [place]" is more successful than gradually quitting.  Attending in-person counseling to help you build problem-solving skills. You are more likely to have  success in quitting if you attend several counseling sessions. Even short sessions of 10 minutes can be effective.  Finding resources and support systems that can help you to quit smoking and remain smoke-free after you quit. These resources are most helpful when you use them often. They can include: ? Online chats with a Social worker. ? Telephone quitlines. ? Careers information officer. ? Support groups or group counseling. ? Text  messaging programs. ? Mobile phone applications.  Taking medicines to help you quit smoking. (If you are pregnant or breastfeeding, talk with your health care provider first.) Some medicines contain nicotine and some do not. Both types of medicines help with cravings, but the medicines that include nicotine help to relieve withdrawal symptoms. Your health care provider may recommend: ? Nicotine patches, gum, or lozenges. ? Nicotine inhalers or sprays. ? Non-nicotine medicine that is taken by mouth.  Talk with your health care provider about combining strategies, such as taking medicines while you are also receiving in-person counseling. Using these two strategies together makes you more likely to succeed in quitting than if you used either strategy on its own. If you are pregnant or breastfeeding, talk with your health care provider about finding counseling or other support strategies to quit smoking. Do not take medicine to help you quit smoking unless told to do so by your health care provider. What things can I do to make it easier to quit? Quitting smoking might feel overwhelming at first, but there is a lot that you can do to make it easier. Take these important actions:  Reach out to your family and friends and ask that they support and encourage you during this time. Call telephone quitlines, reach out to support groups, or work with a counselor for support.  Ask people who smoke to avoid smoking around you.  Avoid places that trigger you to smoke, such as bars, parties, or smoke-break areas at work.  Spend time around people who do not smoke.  Lessen stress in your life, because stress can be a smoking trigger for some people. To lessen stress, try: ? Exercising regularly. ? Deep-breathing exercises. ? Yoga. ? Meditating. ? Performing a body scan. This involves closing your eyes, scanning your body from head to toe, and noticing which parts of your body are particularly tense.  Purposefully relax the muscles in those areas.  Download or purchase mobile phone or tablet apps (applications) that can help you stick to your quit plan by providing reminders, tips, and encouragement. There are many free apps, such as QuitGuide from the [employer] Office manager for Disease Control and Prevention). You can find other support for quitting smoking (smoking cessation) through smokefree.gov and other websites.  How will I feel when I quit smoking? Within the first 24 hours of quitting smoking, you may start to feel some withdrawal symptoms. These symptoms are usually most noticeable 2-3 days after quitting, but they usually do not last beyond 2-3 weeks. Changes or symptoms that you might experience include:  Mood swings.  Restlessness, anxiety, or irritation.  Difficulty concentrating.  Dizziness.  Strong cravings for sugary foods in addition to nicotine.  Mild weight gain.  Constipation.  Nausea.  Coughing or a sore throat.  Changes in how your medicines work in your body.  A depressed mood.  Difficulty sleeping (insomnia).  After the first 2-3 weeks of quitting, you may start to notice more positive results, such as:  Improved sense of smell and taste.  Decreased coughing and sore throat.  Slower heart rate.  Lower blood pressure.  Clearer skin.  The ability to breathe more easily.  Fewer sick days.  Quitting smoking is very challenging for most people. Do not get discouraged if you are not successful the first time. Some people need to make many attempts to quit before they achieve long-term success. Do your best to stick to your quit plan, and talk with your health care provider if you have any questions or concerns. This information is not intended to replace advice given to you by your health care provider. Make sure you discuss any questions you have with your health care provider. Document Released: 06/13/2001 Document Revised: 02/15/2016 Document Reviewed:  11/03/2014 Elsevier Interactive Patient Education  2017 [place] you healthy  Get these tests  Blood pressure- Have your blood pressure checked once a year by your healthcare provider.  Normal blood pressure is 120/80.  Weight- Have your body mass index (BMI) calculated to screen for obesity.  BMI is a measure of body fat based on height and weight. You can also calculate your own BMI at GravelBags.it.  Cholesterol- Have your cholesterol checked regularly starting at age 5, sooner may be necessary if you have diabetes, high blood pressure, if a family member developed heart diseases at an early age or if you smoke.   Chlamydia, HIV, and other sexual transmitted disease- Get screened each year until the age of 18 then within three months of each new sexual partner.  Diabetes- Have your blood sugar checked regularly if you have high blood pressure, high cholesterol, a family history of diabetes or if you are overweight.  Get these vaccines  Flu shot- Every fall.  Tetanus shot- Every 10 years.  Menactra- Single dose; prevents meningitis.  Take these steps  Don't smoke- If you do smoke, ask your healthcare provider about quitting. For tips on how to quit, go to www.smokefree.gov or call 1-800-QUIT-NOW.  Be physically active- Exercise 5 days a week for at least 30 minutes.  If you are not already physically active start slow and gradually work up to 30 minutes of moderate physical activity.  Examples of moderate activity include walking briskly, mowing the yard, dancing, swimming bicycling, etc.  Eat a healthy diet- Eat a variety of healthy foods such as fruits, vegetables, low fat milk, low fat cheese, yogurt, lean meats, poultry, fish, beans, tofu, etc.  For more information on healthy eating, go to www.thenutritionsource.org  Drink alcohol in moderation- Limit alcohol intake two drinks or less a day.  Never drink and drive.  Dentist- Brush and floss  teeth twice daily; visit your dentis twice a year.  Depression-Your emotional health is as important as your physical health.  If you're feeling down, losing interest in things you normally enjoy please talk with your healthcare provider.  Gun Safety- If you keep a gun in your home, keep it unloaded and with the safety lock on.  Bullets should be stored separately.  Helmet use- Always wear a helmet when riding a motorcycle, bicycle, rollerblading or skateboarding.  Safe sex- If you may be exposed to a sexually transmitted infection, use a condom  Seat belts- Seat bels can save your life; always wear one.  Smoke/Carbon Monoxide detectors- These detectors need to be installed on the appropriate level of your home.  Replace batteries at least once a year.  Skin Cancer- When out in the sun, cover up and use sunscreen SPF 15 or higher.  Violence- If anyone is threatening  or hurting you, please tell your healthcare provider.  IF you received an x-ray today, you will receive an invoice from [facility]. Please contact [facility] at [phone] with questions or concerns regarding your invoice.   IF you received labwork today, you will receive an invoice from [facility]. Please contact [facility] at [phone] with questions or concerns regarding your invoice.   Our billing staff will not be able to assist you with questions regarding bills from these companies.  You will be contacted with the lab results as soon as they are available. The fastest way to get your results is to activate your My Chart account. Instructions are located on the last page of this paperwork. If you have not heard from [place] regarding the results in 2 weeks, please contact this office.

## 2017-02-14 LAB — HIV ANTIBODY (ROUTINE TESTING W REFLEX): HIV Screen 4th Generation wRfx: NONREACTIVE

## 2017-03-14 ENCOUNTER — Telehealth: Payer: Self-pay | Admitting: Internal Medicine

## 2017-03-14 NOTE — Telephone Encounter (Signed)
Patient will come in on Monday at 9:45 to discuss

## 2017-03-19 ENCOUNTER — Other Ambulatory Visit (INDEPENDENT_AMBULATORY_CARE_PROVIDER_SITE_OTHER): Payer: Managed Care, Other (non HMO)

## 2017-03-19 ENCOUNTER — Encounter: Payer: Self-pay | Admitting: Internal Medicine

## 2017-03-19 ENCOUNTER — Ambulatory Visit (INDEPENDENT_AMBULATORY_CARE_PROVIDER_SITE_OTHER): Payer: Managed Care, Other (non HMO) | Admitting: Internal Medicine

## 2017-03-19 VITALS — BP 146/98 | HR 76 | Ht 73.0 in | Wt 296.4 lb

## 2017-03-19 DIAGNOSIS — Z79899 Other long term (current) drug therapy: Secondary | ICD-10-CM

## 2017-03-19 DIAGNOSIS — G43A0 Cyclical vomiting, not intractable: Secondary | ICD-10-CM

## 2017-03-19 DIAGNOSIS — R1013 Epigastric pain: Secondary | ICD-10-CM

## 2017-03-19 DIAGNOSIS — K50919 Crohn's disease, unspecified, with unspecified complications: Secondary | ICD-10-CM | POA: Diagnosis not present

## 2017-03-19 DIAGNOSIS — G8929 Other chronic pain: Secondary | ICD-10-CM

## 2017-03-19 DIAGNOSIS — R1115 Cyclical vomiting syndrome unrelated to migraine: Secondary | ICD-10-CM

## 2017-03-19 DIAGNOSIS — Z7960 Long term (current) use of unspecified immunomodulators and immunosuppressants: Secondary | ICD-10-CM

## 2017-03-19 LAB — LIPASE: LIPASE: 26 U/L (ref 11.0–59.0)

## 2017-03-19 LAB — AMYLASE: AMYLASE: 41 U/L (ref 27–131)

## 2017-03-19 MED ORDER — ONDANSETRON 4 MG PO TBDP: 4.0000 mg | ORAL_TABLET | Freq: Three times a day (TID) | ORAL | 5 refills | Status: DC | PRN

## 2017-03-19 MED ORDER — PROCHLORPERAZINE 25 MG RE SUPP: 25.0000 mg | Freq: Two times a day (BID) | RECTAL | 2 refills | Status: DC | PRN

## 2017-03-19 NOTE — Patient Instructions (Signed)
Your physician has requested that you go to the basement for lab work before leaving today.  We have sent the following medications to your pharmacy for you to pick up at your convenience: Compazine and Zofran.   You have been scheduled for an endoscopy. Please follow written instructions given to you at your visit today. If you use inhalers (even only as needed), please bring them with you on the day of your procedure. Your physician has requested that you go to www.startemmi.com and enter the access code given to you at your visit today. This web site gives a general overview about your procedure. However, you should still follow specific instructions given to you by our office regarding your preparation for the procedure.

## 2017-03-19 NOTE — Progress Notes (Signed)
Craig Guzman 46 y.o. 17-Sep-1970 [id number]  Assessment & Plan:   Encounter Diagnoses  Name Primary?  . Crohn's disease with complication, unspecified gastrointestinal tract location ([place])   . Abdominal pain, chronic, epigastric Yes  . Non-intractable cyclical vomiting with nausea   . Long-term use of immunosuppressant medication     I suspect his gastric inflammatory disease problems are behind his symptoms and not pancreas though in December it was thought that maybe he had pancreatitis, I think this stranding around the pancreas could've been from the antrum or duodenal bulb. Amylase and lipase today, schedule for EGD which she will do to [facility]. Go ahead and get his QuantiFERON for the year. Further plans pending this.  Subjective:   Chief Complaint:Epigastric pain  HPI Craig Guzman is here with his wife, I have not seen him in some time. Overall he has done reasonably well though he has chronic symptoms not as bad as in the past but in the past several weeks she's having increasing epigastric pain that awakens him in the early morning hours. He will vomit sometimes as in the past. If he puts any food on his stomach he'll definitely vomit during these painful episodes. He still limits what he eats before work so he doesn't tend to vomit. He is on his Humira. Twice monthly. In December he was hospitalized with suspected acute pancreatitis of the lipase of 100 and some stranding around the pancreas on the CT angiogram of the chest and abdomen because it was having chest and back pain may rule out a dissection. HCTZ was stopped during this hospitalization. He's had an emergency room visit in May for a headache. 2 months ago alkaline phosphatase was 164 AST 41 ALT 60 and then one month ago almost all normal except for alkaline phosphatase 129. His wife reports that he continues to have intermittent foul-smelling burps.  [place] asks about whether or not he could fix the lower central sphincter so he  doesn't vomit. He had a lower esophageal sphincter pressure resting of 13 on a manometry in 2015 at [facility]. Normal motility otherwise. He had a normal four-hour gastric emptying study then as well. Allergies  Allergen Reactions  . Zithromax [Azithromycin] Hypertension   Current Meds  Medication Sig  . Adalimumab (HUMIRA PEN) 40 MG/0.8ML PNKT Inject 40 mg into the skin every 14 (fourteen) days. After completion of the starter  . cyclobenzaprine (FLEXERIL) 10 MG tablet Take 1 tablet (10 mg total) by mouth 3 (three) times daily as needed (for migraines).  Marland Kitchen doxazosin (CARDURA) 2 MG tablet Take 1 tablet (2 mg total) by mouth daily.  Marland Kitchen lisinopril (PRINIVIL,ZESTRIL) 40 MG tablet Take 1 tablet (40 mg total) by mouth daily.  Marland Kitchen omeprazole-sodium bicarbonate (ZEGERID) 40-1100 MG capsule Take 1 capsule by mouth daily with supper.  . prochlorperazine (COMPAZINE) 25 MG suppository Place 25 mg rectally every 12 (twelve) hours as needed for nausea or vomiting.  . rizatriptan (MAXALT-MLT) 10 MG disintegrating tablet Take 1 tablet (10 mg total) by mouth 3 (three) times daily as needed for migraine.  . verapamil (CALAN-SR) 180 MG CR tablet Take 2 tablets (360 mg total) by mouth daily.   Past Medical History:  Diagnosis Date  . Allergy    trees/ grasses/ animals/dust/mold  . Anxiety   . Biliary dyskinesia   . Colloid thyroid nodule   . Crohn's disease ([place])    Stomach, terminal ileum, cecum  . Gastric ulcer    antral  . GERD (gastroesophageal reflux disease)   .  History of migraine headaches   . HTN (hypertension)   . Kidney stone 09/20/2012  . Migraine   . Obesity   . Pneumonia 02/27/2014   ED   Past Surgical History:  Procedure Laterality Date  . CHOLECYSTECTOMY  2011   [place]  . COLONOSCOPY  07/26/11   Crohn's colitis, ileitis  . ESOPHAGOGASTRODUODENOSCOPY  05/04/11, 07/26/11   granulomatous gastritis - Crohn's  . FLEXIBLE SIGMOIDOSCOPY    . JOINT REPLACEMENT    . MULTIPLE TOOTH  EXTRACTIONS  2005  . SHOULDER SURGERY     right  . VASECTOMY     bilateral w/lysis of penile adhesions   Social History   Social History  . Marital status: Married    Spouse name: N/A  . Number of children: 3  . Years of education: Some college   Occupational History  . [employer]    Social History Main Topics  . Smoking status: Current Every Day Smoker    Packs/day: 0.50    Years: 21.00    Types: Cigarettes  . Smokeless tobacco: Former Systems developer    Types: Snuff     Comment: Counseling sheet given in exam room   . Alcohol use No  . Drug use: No  . Sexual activity: Yes    Social History Narrative   Married. Education: [employer].    Lives at home w/ his wife and kids   Right-handed   Caffeine: 3 sodas per day   family history includes Asthma in his mother; Colon polyps in his father; Diabetes in his father; Heart disease in his maternal grandmother; Hypertension in his father and mother.   Review of Systems As per history of present illness  Objective:   Physical Exam @BP  (!) 146/98   Pulse 76   Ht 6' 1"  (1.854 m)   Wt 296 lb 6 oz (134.4 kg)   BMI 39.10 kg/m @  General:  NAD Eyes:   anicteric Lungs:  clear Heart::  S1S2 no rubs, murmurs or gallops Abdomen:   obese soft mildly tender epigastrium, BS+ Ext:   no edema, cyanosis or clubbing    Data Reviewed:  As per history of present illness

## 2017-03-19 NOTE — Assessment & Plan Note (Signed)
EGD

## 2017-03-20 ENCOUNTER — Encounter: Payer: Self-pay | Admitting: Internal Medicine

## 2017-03-20 ENCOUNTER — Ambulatory Visit (AMBULATORY_SURGERY_CENTER): Payer: Managed Care, Other (non HMO) | Admitting: Internal Medicine

## 2017-03-20 VITALS — BP 128/78 | HR 55 | Temp 99.5°F | Resp 12 | Ht 73.0 in | Wt 296.0 lb

## 2017-03-20 DIAGNOSIS — R1013 Epigastric pain: Secondary | ICD-10-CM

## 2017-03-20 DIAGNOSIS — K2980 Duodenitis without bleeding: Secondary | ICD-10-CM | POA: Diagnosis not present

## 2017-03-20 DIAGNOSIS — K50919 Crohn's disease, unspecified, with unspecified complications: Secondary | ICD-10-CM | POA: Diagnosis not present

## 2017-03-20 DIAGNOSIS — K315 Obstruction of duodenum: Secondary | ICD-10-CM | POA: Diagnosis not present

## 2017-03-20 DIAGNOSIS — K259 Gastric ulcer, unspecified as acute or chronic, without hemorrhage or perforation: Secondary | ICD-10-CM | POA: Diagnosis not present

## 2017-03-20 MED ORDER — SODIUM CHLORIDE 0.9 % IV SOLN: 500.0000 mL | INTRAVENOUS | Status: DC

## 2017-03-20 NOTE — Patient Instructions (Addendum)
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 I dilated it and took biopsies of that and the stomach. Aside from that I think things look better than in the past. This narrow area might source of problems - don't recall seeing that before. If this doesn't help will consider a larger dilation but we always are cautious and try not to overdo it and cause damage.  I appreciate the opportunity to care for you. Gatha Mayer, MD, FACG YOU HAD AN ENDOSCOPIC PROCEDURE TODAY AT [facility]:   Refer to the procedure report that was given to you for any specific questions about what was found during the examination.  If the procedure report does not answer your questions, please call your gastroenterologist to clarify.  If you requested that your care partner not be given the details of your procedure findings, then the procedure report has been included in a sealed envelope for you to review at your convenience later.  YOU SHOULD EXPECT: Some feelings of bloating in the abdomen. Passage of more gas than usual.  Walking can help get rid of the air that was put into your GI tract during the procedure and reduce the bloating. If you had a lower endoscopy (such as a colonoscopy or flexible sigmoidoscopy) you may notice spotting of blood in your stool or on the toilet paper. If you underwent a bowel prep for your procedure, you may not have a normal bowel movement for a few days.  Please Note:  You might notice some irritation and congestion in your nose or some drainage.  This is from the oxygen used during your procedure.  There is no need for concern and it should clear up in a day or so.  SYMPTOMS TO REPORT IMMEDIATELY:   Following upper endoscopy (EGD)  Vomiting of blood or coffee ground material  New chest pain or pain under the shoulder blades  Painful or persistently difficult swallowing  New shortness of breath  Fever of 100F or  higher  Black, tarry-looking stools  For urgent or emergent issues, a gastroenterologist can be reached at any hour by calling [phone].   DIET:  We do recommend a small meal at first, but then you may proceed to your regular diet.  Drink plenty of fluids but you should avoid alcoholic beverages for 24 hours.  ACTIVITY:  You should plan to take it easy for the rest of today and you should NOT DRIVE or use heavy machinery until tomorrow (because of the sedation medicines used during the test).    FOLLOW UP: Our staff will call the number listed on your records the next business day following your procedure to check on you and address any questions or concerns that you may have regarding the information given to you following your procedure. If we do not reach you, we will leave a message.  However, if you are feeling well and you are not experiencing any problems, there is no need to return our call.  We will assume that you have returned to your regular daily activities without incident.  If any biopsies were taken you will be contacted by phone or by letter within the next 1-3 weeks.  Please call us at [phone] if you have not heard about the biopsies in 3 weeks.   Await for biopsy results    SIGNATURES/CONFIDENTIALITY: You and/or your care partner have signed paperwork which will be entered  into your electronic medical record.  These signatures attest to the fact that that the information above on your After Visit Summary has been reviewed and is understood.  Full responsibility of the confidentiality of this discharge information lies with you and/or your care-partner.

## 2017-03-20 NOTE — Progress Notes (Signed)
Called to room to assist during endoscopic procedure.  Patient ID and intended procedure confirmed with present staff. Received instructions for my participation in the procedure from the performing physician.  

## 2017-03-20 NOTE — Progress Notes (Signed)
Report given to PACU, vss 

## 2017-03-20 NOTE — Progress Notes (Signed)
Pt's states no medical or surgical changes since previsit or office visit. 

## 2017-03-20 NOTE — Op Note (Signed)
[place] Patient Name: Craig Guzman Procedure Date: 03/20/2017 3:27 PM MRN: [id number] Endoscopist: Gatha Mayer , MD Age: 46 Referring MD:  Date of Birth: 10/22/1970 Gender: Male Account #: [id number] Procedure:                Upper GI endoscopy Indications:              Epigastric abdominal pain, Crohn's disease Medicines:                Propofol per Anesthesia, Monitored Anesthesia Care Procedure:                Pre-Anesthesia Assessment:                           - Prior to the procedure, a History and Physical                            was performed, and patient medications and                            allergies were reviewed. The patient's tolerance of                            previous anesthesia was also reviewed. The risks                            and benefits of the procedure and the sedation                            options and risks were discussed with the patient.                            All questions were answered, and informed consent                            was obtained. Prior Anticoagulants: The patient has                            taken no previous anticoagulant or antiplatelet                            agents. ASA Grade Assessment: III - A patient with                            severe systemic disease. After reviewing the risks                            and benefits, the patient was deemed in                            satisfactory condition to undergo the procedure.                           After obtaining informed consent, the endoscope was  passed under direct vision. Throughout the                            procedure, the patient's blood pressure, pulse, and                            oxygen saturations were monitored continuously. The                            Endoscope was introduced through the mouth, and                            advanced to the second part of duodenum. The upper             GI endoscopy was accomplished without difficulty.                            The patient tolerated the procedure well. Scope In: Scope Out: Findings:                 An acquired benign-appearing, intrinsic moderate                            stenosis was found in the duodenal bulb (edematous)                            and was traversed. Biopsies were taken with a cold                            forceps for histology. Verification of patient                            identification for the specimen was done. Estimated                            blood loss was minimal. A TTS dilator was passed                            through the scope. Dilation with a 13.5-14.5-15.5                            mm balloon dilator was performed to 15.5 mm. The                            dilation site was examined and showed moderate                            improvement in luminal narrowing. Estimated blood                            loss was minimal.                           Diffuse mild mucosal changes characterized by  discoloration and scarring were found in the                            gastric antrum. Biopsies were taken with a cold                            forceps for histology. Verification of patient                            identification for the specimen was done. Estimated                            blood loss was minimal.                           The exam was otherwise without abnormality.                           The cardia and gastric fundus were normal on                            retroflexion. Complications:            No immediate complications. Estimated Blood Loss:     Estimated blood loss was minimal. Impression:               - Acquired duodenal stenosis. Biopsied. Dilated. ?                            if cause of current sxs                           - Discolored and scarred mucosa in the antrum.                            Biopsied.  Looks improved compared to past                           - The examination was otherwise normal. Recommendation:           - Patient has a contact number available for                            emergencies. The signs and symptoms of potential                            delayed complications were discussed with the                            patient. Return to normal activities tomorrow.                            Written discharge instructions were provided to the                            patient.                           -  Resume previous diet.                           - Continue present medications.                           - Await pathology results. Gatha Mayer, MD 03/20/2017 3:56:00 PM This report has been signed electronically.

## 2017-03-21 ENCOUNTER — Telehealth: Payer: Self-pay | Admitting: *Deleted

## 2017-03-21 LAB — QUANTIFERON TB GOLD ASSAY (BLOOD)
Mitogen-Nil: >10 IU/mL
QUANTIFERON(R)-TB GOLD: NEGATIVE
Quantiferon Nil Value: 0.22 IU/mL
Quantiferon Tb Ag Minus Nil Value: <0 IU/mL

## 2017-03-21 NOTE — Telephone Encounter (Signed)
  Follow up Call-  Call back number 03/20/2017  Post procedure Call Back phone  # [phone]  Permission to leave phone message Yes  Some recent data might be hidden     Patient questions:  Do you have a fever, pain , or abdominal swelling? No. Pain Score  0 *  Have you tolerated food without any problems? Yes.    Have you been able to return to your normal activities? Yes.    Do you have any questions about your discharge instructions: Diet   No. Medications  No. Follow up visit  No.  Do you have questions or concerns about your Care? No.  Actions: * If pain score is 4 or above: No action needed, pain <4.  Spoke to wife who states Mr Aiken did excellent after his procedure with no issues or concerns

## 2017-03-26 ENCOUNTER — Telehealth: Payer: Self-pay | Admitting: Internal Medicine

## 2017-03-26 ENCOUNTER — Encounter (HOSPITAL_COMMUNITY): Payer: Self-pay

## 2017-03-26 DIAGNOSIS — K315 Obstruction of duodenum: Secondary | ICD-10-CM | POA: Diagnosis present

## 2017-03-26 DIAGNOSIS — I10 Essential (primary) hypertension: Secondary | ICD-10-CM | POA: Diagnosis present

## 2017-03-26 DIAGNOSIS — Z9049 Acquired absence of other specified parts of digestive tract: Secondary | ICD-10-CM

## 2017-03-26 DIAGNOSIS — K2950 Unspecified chronic gastritis without bleeding: Secondary | ICD-10-CM | POA: Diagnosis present

## 2017-03-26 DIAGNOSIS — Z79899 Other long term (current) drug therapy: Secondary | ICD-10-CM

## 2017-03-26 DIAGNOSIS — R001 Bradycardia, unspecified: Secondary | ICD-10-CM | POA: Diagnosis present

## 2017-03-26 DIAGNOSIS — K5010 Crohn's disease of large intestine without complications: Principal | ICD-10-CM | POA: Diagnosis present

## 2017-03-26 DIAGNOSIS — R112 Nausea with vomiting, unspecified: Secondary | ICD-10-CM | POA: Diagnosis not present

## 2017-03-26 DIAGNOSIS — F17210 Nicotine dependence, cigarettes, uncomplicated: Secondary | ICD-10-CM | POA: Diagnosis present

## 2017-03-26 DIAGNOSIS — G43909 Migraine, unspecified, not intractable, without status migrainosus: Secondary | ICD-10-CM | POA: Diagnosis present

## 2017-03-26 DIAGNOSIS — Z6838 Body mass index (BMI) 38.0-38.9, adult: Secondary | ICD-10-CM

## 2017-03-26 DIAGNOSIS — Z881 Allergy status to other antibiotic agents status: Secondary | ICD-10-CM

## 2017-03-26 DIAGNOSIS — Z8249 Family history of ischemic heart disease and other diseases of the circulatory system: Secondary | ICD-10-CM

## 2017-03-26 DIAGNOSIS — Z87442 Personal history of urinary calculi: Secondary | ICD-10-CM

## 2017-03-26 DIAGNOSIS — Z833 Family history of diabetes mellitus: Secondary | ICD-10-CM

## 2017-03-26 DIAGNOSIS — N390 Urinary tract infection, site not specified: Secondary | ICD-10-CM | POA: Diagnosis present

## 2017-03-26 DIAGNOSIS — Z8371 Family history of colonic polyps: Secondary | ICD-10-CM

## 2017-03-26 DIAGNOSIS — K219 Gastro-esophageal reflux disease without esophagitis: Secondary | ICD-10-CM | POA: Diagnosis present

## 2017-03-26 LAB — COMPREHENSIVE METABOLIC PANEL
ALBUMIN: 3.8 g/dL (ref 3.5–5.0)
ALK PHOS: 151 U/L — AB (ref 38–126)
ALT: 50 U/L (ref 17–63)
ANION GAP: 8 (ref 5–15)
AST: 48 U/L — ABNORMAL HIGH (ref 15–41)
BUN: 12 mg/dL (ref 6–20)
CALCIUM: 8.8 mg/dL — AB (ref 8.9–10.3)
CHLORIDE: 107 mmol/L (ref 101–111)
CO2: 25 mmol/L (ref 22–32)
Creatinine, Ser: 1.37 mg/dL — ABNORMAL HIGH (ref 0.61–1.24)
GFR calc non Af Amer: >60 mL/min (ref >60)
GLUCOSE: 97 mg/dL (ref 65–99)
Potassium: 3.8 mmol/L (ref 3.5–5.1)
SODIUM: 140 mmol/L (ref 135–145)
Total Bilirubin: 0.6 mg/dL (ref 0.3–1.2)
Total Protein: 7.8 g/dL (ref 6.5–8.1)

## 2017-03-26 LAB — CBC
HEMATOCRIT: 44.7 % (ref 39.0–52.0)
HEMOGLOBIN: 16 g/dL (ref 13.0–17.0)
MCH: 29 pg (ref 26.0–34.0)
MCHC: 35.8 g/dL (ref 30.0–36.0)
MCV: 81 fL (ref 78.0–100.0)
Platelets: 212 10*3/uL (ref 150–400)
RBC: 5.52 MIL/uL (ref 4.22–5.81)
RDW: 13.6 % (ref 11.5–15.5)
WBC: 5.6 10*3/uL (ref 4.0–10.5)

## 2017-03-26 LAB — LIPASE, BLOOD: LIPASE: 26 U/L (ref 11–51)

## 2017-03-26 NOTE — Telephone Encounter (Signed)
Patient reports abdominal pain and vomiting.  The pain started yesterday and was on his way to work this morning and started vomiting.  The pain has not improved throughout the day and he is very uncomfortable.  Patient advised to proceed to the ED that he will need imaging studies.

## 2017-03-26 NOTE — ED Triage Notes (Addendum)
Patient c/o upper and mid abdominal pain and intermittent vomiting x 2 days. Patient had an endoscopy 6 days ago. Patient has a history of Crohn's. Patient c/o headache since 1300. Patient states a history of migraines. Patient states he vomited his AM meds which including a BP med.

## 2017-03-26 NOTE — Telephone Encounter (Signed)
Patient verbalized understanding. He has a history of duodenal stricture and his pain is consistent with previous episodes of pain in the same area.

## 2017-03-27 ENCOUNTER — Emergency Department (HOSPITAL_COMMUNITY): Payer: Managed Care, Other (non HMO)

## 2017-03-27 ENCOUNTER — Inpatient Hospital Stay (HOSPITAL_COMMUNITY)
Admission: EM | Admit: 2017-03-27 | Discharge: 2017-03-29 | DRG: 386 | Disposition: A | Discharge status: Home / Self Care | Payer: Managed Care, Other (non HMO) | Attending: Internal Medicine | Admitting: Internal Medicine

## 2017-03-27 ENCOUNTER — Inpatient Hospital Stay (HOSPITAL_COMMUNITY): Payer: Managed Care, Other (non HMO)

## 2017-03-27 DIAGNOSIS — R319 Hematuria, unspecified: Secondary | ICD-10-CM

## 2017-03-27 DIAGNOSIS — I10 Essential (primary) hypertension: Secondary | ICD-10-CM | POA: Diagnosis present

## 2017-03-27 DIAGNOSIS — Z8249 Family history of ischemic heart disease and other diseases of the circulatory system: Secondary | ICD-10-CM | POA: Diagnosis not present

## 2017-03-27 DIAGNOSIS — N390 Urinary tract infection, site not specified: Secondary | ICD-10-CM | POA: Diagnosis present

## 2017-03-27 DIAGNOSIS — K50012 Crohn's disease of small intestine with intestinal obstruction: Secondary | ICD-10-CM

## 2017-03-27 DIAGNOSIS — R112 Nausea with vomiting, unspecified: Secondary | ICD-10-CM | POA: Insufficient documentation

## 2017-03-27 DIAGNOSIS — K5000 Crohn's disease of small intestine without complications: Secondary | ICD-10-CM

## 2017-03-27 DIAGNOSIS — K219 Gastro-esophageal reflux disease without esophagitis: Secondary | ICD-10-CM

## 2017-03-27 DIAGNOSIS — Z833 Family history of diabetes mellitus: Secondary | ICD-10-CM | POA: Diagnosis not present

## 2017-03-27 DIAGNOSIS — Z6838 Body mass index (BMI) 38.0-38.9, adult: Secondary | ICD-10-CM | POA: Diagnosis not present

## 2017-03-27 DIAGNOSIS — K315 Obstruction of duodenum: Secondary | ICD-10-CM | POA: Diagnosis present

## 2017-03-27 DIAGNOSIS — R001 Bradycardia, unspecified: Secondary | ICD-10-CM | POA: Diagnosis present

## 2017-03-27 DIAGNOSIS — Z79899 Other long term (current) drug therapy: Secondary | ICD-10-CM | POA: Diagnosis not present

## 2017-03-27 DIAGNOSIS — K5010 Crohn's disease of large intestine without complications: Secondary | ICD-10-CM | POA: Diagnosis present

## 2017-03-27 DIAGNOSIS — Z87442 Personal history of urinary calculi: Secondary | ICD-10-CM | POA: Diagnosis not present

## 2017-03-27 DIAGNOSIS — Z9049 Acquired absence of other specified parts of digestive tract: Secondary | ICD-10-CM | POA: Diagnosis not present

## 2017-03-27 DIAGNOSIS — K5090 Crohn's disease, unspecified, without complications: Secondary | ICD-10-CM | POA: Diagnosis present

## 2017-03-27 DIAGNOSIS — G43909 Migraine, unspecified, not intractable, without status migrainosus: Secondary | ICD-10-CM | POA: Diagnosis present

## 2017-03-27 DIAGNOSIS — F17210 Nicotine dependence, cigarettes, uncomplicated: Secondary | ICD-10-CM | POA: Diagnosis present

## 2017-03-27 DIAGNOSIS — Z8371 Family history of colonic polyps: Secondary | ICD-10-CM | POA: Diagnosis not present

## 2017-03-27 DIAGNOSIS — Z881 Allergy status to other antibiotic agents status: Secondary | ICD-10-CM | POA: Diagnosis not present

## 2017-03-27 DIAGNOSIS — K2950 Unspecified chronic gastritis without bleeding: Secondary | ICD-10-CM | POA: Diagnosis present

## 2017-03-27 LAB — URINALYSIS, ROUTINE W REFLEX MICROSCOPIC
Glucose, UA: NEGATIVE mg/dL
Hgb urine dipstick: NEGATIVE
Ketones, ur: 5 mg/dL — AB
Nitrite: NEGATIVE
PH: 5 (ref 5.0–8.0)
Protein, ur: 100 mg/dL — AB
SPECIFIC GRAVITY, URINE: 1.03 (ref 1.005–1.030)

## 2017-03-27 LAB — CBC
HEMATOCRIT: 40.6 % (ref 39.0–52.0)
HEMOGLOBIN: 14 g/dL (ref 13.0–17.0)
MCH: 28.6 pg (ref 26.0–34.0)
MCHC: 34.5 g/dL (ref 30.0–36.0)
MCV: 83 fL (ref 78.0–100.0)
Platelets: 177 10*3/uL (ref 150–400)
RBC: 4.89 MIL/uL (ref 4.22–5.81)
RDW: 13.8 % (ref 11.5–15.5)
WBC: 4.8 10*3/uL (ref 4.0–10.5)

## 2017-03-27 LAB — C-REACTIVE PROTEIN

## 2017-03-27 LAB — CREATININE, SERUM: Creatinine, Ser: 1.14 mg/dL (ref 0.61–1.24)

## 2017-03-27 LAB — CK: Total CK: 191 U/L (ref 49–397)

## 2017-03-27 LAB — MAGNESIUM: MAGNESIUM: 2.1 mg/dL (ref 1.7–2.4)

## 2017-03-27 LAB — SEDIMENTATION RATE: SED RATE: 25 mm/h — AB (ref 0–16)

## 2017-03-27 MED ORDER — PANTOPRAZOLE SODIUM 40 MG PO TBEC: 40.0000 mg | DELAYED_RELEASE_TABLET | Freq: Every day | ORAL | Status: DC

## 2017-03-27 MED ORDER — IOPAMIDOL (ISOVUE-300) INJECTION 61%
INTRAVENOUS | Status: AC
  Administered 2017-03-27: 15 mL
  Filled 2017-03-27: qty 30

## 2017-03-27 MED ORDER — ENOXAPARIN SODIUM 60 MG/0.6ML ~~LOC~~ SOLN
60.0000 mg | SUBCUTANEOUS | Status: DC
  Filled 2017-03-27: qty 0.6

## 2017-03-27 MED ORDER — SODIUM CHLORIDE 0.9 % IV BOLUS (SEPSIS)
1000.0000 mL | Freq: Once | INTRAVENOUS | Status: AC
  Administered 2017-03-27: 1000 mL via INTRAVENOUS

## 2017-03-27 MED ORDER — METOCLOPRAMIDE HCL 5 MG/ML IJ SOLN
10.0000 mg | Freq: Once | INTRAMUSCULAR | Status: AC
  Administered 2017-03-27: 10 mg via INTRAVENOUS
  Filled 2017-03-27: qty 2

## 2017-03-27 MED ORDER — ACETAMINOPHEN 325 MG PO TABS: 650.0000 mg | ORAL_TABLET | Freq: Four times a day (QID) | ORAL | Status: DC | PRN

## 2017-03-27 MED ORDER — METHYLPREDNISOLONE SODIUM SUCC 40 MG IJ SOLR
40.0000 mg | Freq: Two times a day (BID) | INTRAMUSCULAR | Status: DC
  Administered 2017-03-27: 40 mg via INTRAVENOUS
  Filled 2017-03-27: qty 1

## 2017-03-27 MED ORDER — IOPAMIDOL (ISOVUE-300) INJECTION 61%: 15.0000 mL | Freq: Once | INTRAVENOUS | Status: DC | PRN

## 2017-03-27 MED ORDER — VERAPAMIL HCL ER 180 MG PO TBCR
360.0000 mg | EXTENDED_RELEASE_TABLET | Freq: Every day | ORAL | Status: DC
  Administered 2017-03-27: 360 mg via ORAL
  Filled 2017-03-27: qty 2

## 2017-03-27 MED ORDER — PANTOPRAZOLE SODIUM 40 MG IV SOLR
40.0000 mg | Freq: Two times a day (BID) | INTRAVENOUS | Status: DC
  Administered 2017-03-27 – 2017-03-29 (×5): 40 mg via INTRAVENOUS
  Filled 2017-03-27 (×6): qty 40

## 2017-03-27 MED ORDER — ONDANSETRON HCL 4 MG PO TABS: 4.0000 mg | ORAL_TABLET | Freq: Four times a day (QID) | ORAL | Status: DC | PRN

## 2017-03-27 MED ORDER — IOPAMIDOL (ISOVUE-300) INJECTION 61%
INTRAVENOUS | Status: AC
  Filled 2017-03-27: qty 100

## 2017-03-27 MED ORDER — ACETAMINOPHEN 650 MG RE SUPP: 650.0000 mg | Freq: Four times a day (QID) | RECTAL | Status: DC | PRN

## 2017-03-27 MED ORDER — POTASSIUM CHLORIDE IN NACL 20-0.9 MEQ/L-% IV SOLN
INTRAVENOUS | Status: DC
  Administered 2017-03-27 – 2017-03-29 (×4): via INTRAVENOUS
  Filled 2017-03-27 (×6): qty 1000

## 2017-03-27 MED ORDER — SODIUM CHLORIDE 0.9% FLUSH: 3.0000 mL | Freq: Two times a day (BID) | INTRAVENOUS | Status: DC

## 2017-03-27 MED ORDER — ONDANSETRON HCL 4 MG/2ML IJ SOLN
4.0000 mg | Freq: Four times a day (QID) | INTRAMUSCULAR | Status: DC | PRN
Start: 2017-03-27 — End: 2017-03-29
  Administered 2017-03-28: 4 mg via INTRAVENOUS

## 2017-03-27 MED ORDER — IOPAMIDOL (ISOVUE-300) INJECTION 61%
100.0000 mL | Freq: Once | INTRAVENOUS | Status: AC | PRN
  Administered 2017-03-27: 100 mL via INTRAVENOUS

## 2017-03-27 MED ORDER — DICYCLOMINE HCL 10 MG/ML IM SOLN
20.0000 mg | Freq: Once | INTRAMUSCULAR | Status: AC
Start: 2017-03-27 — End: 2017-03-27
  Administered 2017-03-27: 20 mg via INTRAMUSCULAR
  Filled 2017-03-27: qty 2

## 2017-03-27 MED ORDER — DEXTROSE 5 % IV SOLN
1.0000 g | Freq: Once | INTRAVENOUS | Status: AC
  Administered 2017-03-27: 1 g via INTRAVENOUS
  Filled 2017-03-27: qty 10

## 2017-03-27 MED ORDER — OXYCODONE HCL 5 MG PO TABS: 5.0000 mg | ORAL_TABLET | ORAL | Status: DC | PRN

## 2017-03-27 MED ORDER — VERAPAMIL HCL ER 180 MG PO TBCR
180.0000 mg | EXTENDED_RELEASE_TABLET | Freq: Every day | ORAL | Status: DC
  Administered 2017-03-28 – 2017-03-29 (×2): 180 mg via ORAL
  Filled 2017-03-27 (×2): qty 1

## 2017-03-27 MED ORDER — HYDRALAZINE HCL 20 MG/ML IJ SOLN: 5.0000 mg | INTRAMUSCULAR | Status: DC | PRN

## 2017-03-27 MED ORDER — DOXAZOSIN MESYLATE 2 MG PO TABS
2.0000 mg | ORAL_TABLET | Freq: Every day | ORAL | Status: DC
  Administered 2017-03-27 – 2017-03-29 (×3): 2 mg via ORAL
  Filled 2017-03-27 (×3): qty 1

## 2017-03-27 NOTE — Consult Note (Signed)
Consultation  Referring Provider: [facility] hospitalist/Dr. Murrell Converse  Primary Care Physician:  [name], MD Primary Gastroenterologist:  Dr.Gessner  Reason for Consultation:  Nausea, vomiting and epigastric pain  HPI: Craig Guzman is a 46 y.o. male Well-known to [name] who was admitted through the emergency room this morning after multiple episodes of nausea and vomiting, Sunday into yesterday. This is been associated with crampy spasm-like upper abdominal pain. Patient has history of Crohn's disease involving the upper gut with previously documented granulomatous gastritis, and also has Crohn's colitis. He was initially diagnosed about 10 years ago and has been on biologic therapy over the past 3 years. He was initiated on Humira, switched toCimzia for a short period of time for insurance reasons and is been back on Humira this year.   Patient was seen in the office a few weeks ago with complaints of epigastric pain and intermittent vomiting. He underwent EGD per [name] on 03/20/2017 with finding of an acquired moderate stenosis of the duodenal bulb which was traversed and then dilated from 13.5-15.5 mm. He was also noted to have a scarred antrum, with no active inflammation. Biopsies from the duodenal bulb showed inflamed small bowel and gastric biopsies showed chronic active gastritis. Patient has been on omeprazole, his last Humira injection was over this past weekend. He says after the balloon dilation he felt better for a few days and then symptoms recurred. Patient also had a hospitalization in December 2017 with what was felt to be an episode of acute pancreatitis. CT imaging at that time with some inflammatory stranding around the pancreas greatest at the body and head no fluid collection, pancreatic duct not dilated. Flexible sigmoidoscopy was done in January 2015 showing mild left colonic erythema. Patient says he is able to keep his disease under control with Humira but has  never completely been in remission. Other medical issues include hypertension, nephrolithiasis, obesity, and is status post cholecystectomy.  Labs done last evening with normal CBC, lipase within normal limits, alkaline phosphatase 151, AST 48, UA with 6-30 RBCs and 6-30 wbc's rare bacteria negative nitrate positive protein, culture pending   Past Medical History:  Diagnosis Date  . Allergy    trees/ grasses/ animals/dust/mold  . Anxiety   . Biliary dyskinesia   . Colloid thyroid nodule   . Crohn's disease ([place])    Stomach, terminal ileum, cecum  . Gastric ulcer    antral  . GERD (gastroesophageal reflux disease)   . History of migraine headaches   . HTN (hypertension)   . Kidney stone 09/20/2012  . Migraine   . Obesity   . Pneumonia 02/27/2014   ED    Past Surgical History:  Procedure Laterality Date  . CHOLECYSTECTOMY  2011   [place]  . COLONOSCOPY  07/26/11   Crohn's colitis, ileitis  . ESOPHAGOGASTRODUODENOSCOPY  05/04/11, 07/26/11   granulomatous gastritis - Crohn's  . FLEXIBLE SIGMOIDOSCOPY    . JOINT REPLACEMENT    . MULTIPLE TOOTH EXTRACTIONS  2005  . SHOULDER SURGERY     right  . VASECTOMY     bilateral w/lysis of penile adhesions    Prior to Admission medications   Medication Sig Start Date End Date Taking? Authorizing Provider  Adalimumab (HUMIRA PEN) 40 MG/0.8ML PNKT Inject 40 mg into the skin every 14 (fourteen) days. After completion of the starter 06/06/16  Yes Gatha Mayer, MD  cyclobenzaprine (FLEXERIL) 10 MG tablet Take 1 tablet (10 mg total) by mouth 3 (three) times  daily as needed (for migraines). 10/09/16  Yes Ward Givens, NP  doxazosin (CARDURA) 2 MG tablet Take 1 tablet (2 mg total) by mouth daily. 01/13/17  Yes [name], MD  lisinopril (PRINIVIL,ZESTRIL) 40 MG tablet Take 1 tablet (40 mg total) by mouth daily. 01/13/17  Yes [name], MD  omeprazole-sodium bicarbonate (ZEGERID) 40-1100 MG capsule Take 1 capsule by mouth daily  as needed. Heart Burn   Yes [provider]  ondansetron (ZOFRAN-ODT) 4 MG disintegrating tablet Take 1 tablet (4 mg total) by mouth every 8 (eight) hours as needed for nausea or vomiting. 03/19/17  Yes Gatha Mayer, MD  prochlorperazine (COMPAZINE) 25 MG suppository Place 1 suppository (25 mg total) rectally every 12 (twelve) hours as needed for nausea or vomiting. 03/19/17  Yes Gatha Mayer, MD  rizatriptan (MAXALT-MLT) 10 MG disintegrating tablet Take 1 tablet (10 mg total) by mouth 3 (three) times daily as needed for migraine. 10/09/16  Yes Ward Givens, NP  verapamil (CALAN-SR) 180 MG CR tablet Take 2 tablets (360 mg total) by mouth daily. 01/13/17  Yes [name], MD    Current Facility-Administered Medications  Medication Dose Route Frequency Provider Last Rate Last Dose  . 0.9 % NaCl with KCl 20 mEq/ L  infusion   Intravenous Continuous Reyne Dumas, MD      . acetaminophen (TYLENOL) tablet 650 mg  650 mg Oral Q6H PRN Reyne Dumas, MD       Or  . acetaminophen (TYLENOL) suppository 650 mg  650 mg Rectal Q6H PRN Reyne Dumas, MD      . doxazosin (CARDURA) tablet 2 mg  2 mg Oral Daily Abrol, Nayana, MD      . enoxaparin (LOVENOX) injection 60 mg  60 mg Subcutaneous Q24H Abrol, Nayana, MD      . methylPREDNISolone sodium succinate (SOLU-MEDROL) 40 mg/mL injection 40 mg  40 mg Intravenous Q12H Reyne Dumas, MD   40 mg at 03/27/17 0829  . ondansetron (ZOFRAN) tablet 4 mg  4 mg Oral Q6H PRN Reyne Dumas, MD       Or  . ondansetron (ZOFRAN) injection 4 mg  4 mg Intravenous Q6H PRN Abrol, Ascencion Dike, MD      . oxyCODONE (Oxy IR/ROXICODONE) immediate release tablet 5 mg  5 mg Oral Q4H PRN Reyne Dumas, MD      . pantoprazole (PROTONIX) EC tablet 40 mg  40 mg Oral Daily Abrol, Nayana, MD      . sodium chloride flush (NS) 0.9 % injection 3 mL  3 mL Intravenous Q12H Abrol, Ascencion Dike, MD      . verapamil (CALAN-SR) CR tablet 360 mg  360 mg Oral Daily Reyne Dumas, MD         Allergies as of 03/26/2017 - Review Complete 03/26/2017  Allergen Reaction Noted  . Zithromax [azithromycin] Hypertension 04/14/2011    Family History  Problem Relation Age of Onset  . Colon polyps Father   . Diabetes Father   . Hypertension Father   . Hypertension Mother   . Asthma Mother   . Heart disease Maternal Grandmother   . Colon cancer Neg Hx   . Rectal cancer Neg Hx   . Stomach cancer Neg Hx   . Esophageal cancer Neg Hx     Social History   Social History  . Marital status: Married    Spouse name: N/A  . Number of children: 3  . Years of education: Some college   Occupational History  .  [employer]    Social History Main Topics  . Smoking status: Current Every Day Smoker    Packs/day: 0.75    Years: 21.00    Types: Cigarettes  . Smokeless tobacco: Former Systems developer    Types: Snuff     Comment: Counseling sheet given in exam room   . Alcohol use No  . Drug use: No  . Sexual activity: Yes   Other Topics Concern  . Not on file   Social History Narrative   Married. Education: [employer].    Lives at home w/ his wife and kids   Right-handed   Caffeine: 3 sodas per day    Review of Systems: Gen: Denies any fever, chills, sweats, anorexia, fatigue, weakness, malaise, weight loss, and sleep disorder CV: Denies chest pain, angina, palpitations, syncope, orthopnea, PND, peripheral edema, and claudication. Resp: Denies dyspnea at rest, dyspnea with exercise, cough, sputum, wheezing, coughing up blood, and pleurisy. GI: See history of present illness GU : Denies urinary burning, blood in urine, urinary frequency, urinary hesitancy, nocturnal urination, and urinary incontinence. MS: Denies joint pain, limitation of movement, and swelling, stiffness, low back pain, extremity pain. Denies muscle weakness, cramps, atrophy.  Derm: Denies rash, itching, dry skin, hives, moles, warts, or unhealing ulcers.  Psych: Denies depression, anxiety, memory loss, suicidal ideation,  hallucinations, paranoia, and confusion. Heme: Denies bruising, bleeding, and enlarged lymph nodes. Neuro:  Denies any headaches, dizziness, paresthesias. Endo:  Denies any problems with DM, thyroid, adrenal function.  Physical Exam: Vital signs in last 24 hours: Temp:  [97.7 F (36.5 C)-99 F (37.2 C)] 97.7 F (36.5 C) (09/25 0938) Pulse Rate:  [51-64] 51 (09/25 0938) Resp:  [18-20] 18 (09/25 0938) BP: (142-171)/(68-117) 170/110 (09/25 0938) SpO2:  [97 %-100 %] 97 % (09/25 0938) Weight:  [290 lb (131.5 kg)-293 lb (132.9 kg)] 290 lb (131.5 kg) (09/25 0938) Last BM Date: 03/26/17 General:   Alert,  Well-developed, obese AA male ,well-nourished, pleasant and cooperative in NAD Head:  Normocephalic and atraumatic. Eyes:  Sclera clear, no icterus.   Conjunctiva pink. Ears:  Normal auditory acuity. Nose:  No deformity, discharge,  or lesions. Mouth:  No deformity or lesions.   Neck:  Supple; no masses or thyromegaly. Lungs:  Clear throughout to auscultation.   No wheezes, crackles, or rhonchi. Heart:  Regular rate and rhythm; no murmurs, clicks, rubs,  or gallops. Abdomen:  Soft,obese, nondistended , mildly tender epigastrium  BS +, no palpable mass or hepatosplenomegaly.   Rectal:  Deferred  Msk:  Symmetrical without gross deformities. . Pulses:  Normal pulses noted. Extremities:  Without clubbing or edema. Neurologic:  Alert and  oriented x4;  grossly normal neurologically. Skin:  Intact without significant lesions or rashes.. Psych:  Alert and cooperative. Normal mood and affect.  Intake/Output from previous day: No intake/output data recorded. Intake/Output this shift: Total I/O In: -  Out: 425 [Urine:425]  Lab Results:  Recent Labs  03/26/17 1839 03/27/17 0914  WBC 5.6 4.8  HGB 16.0 14.0  HCT 44.7 40.6  PLT 212 177   BMET  Recent Labs  03/26/17 1839 03/27/17 0914  NA 140  --   K 3.8  --   CL 107  --   CO2 25  --   GLUCOSE 97  --   BUN 12  --     CREATININE 1.37* 1.14  CALCIUM 8.8*  --    LFT  Recent Labs  03/26/17 1839  PROT 7.8  ALBUMIN 3.8  AST 48*  ALT 50  ALKPHOS 151*  BILITOT 0.6   PT/INR No results for input(s): LABPROT, INR in the last 72 hours.    IMPRESSION:   #56 46 year old African-American male with Crohn's disease of the upper get with previously documented granulomatous gastritis, and mild colitis. Patient has been having problems with recurrent epigastric pain nausea and vomiting over the past couple of months. EGD on 03/20/2017 showed moderate duodenal bulb stenosis which was balloon dilated to 15.5 mm Pt improved for a few days and then had recurrence of epigastric pain cramping nausea and vomiting.  I suspect he has active Crohn's involving the duodenum which is causing his current symptoms. He may have Crohn's involving other segments of the upper gut as well He did not get a sustained response from recent duodenal bulb dilation  #2; episode of acute pancreatitis December 2017, question Crohn's related #3 obesity #4 nephrolithiasis #5 hypertension #Status post cholecystectomy   Plan; IV fluid hydration, IV antiemetics when necessary IV PPI twice a day We'll check sedimentation rate, CRP Schedule for CT of the abdomen and pelvis with contrast He may require repeat EGD with dilation depending on CT results. If further evidence of active Crohn's found on imaging-will need adalimumab antibody and drug levels done to determine if he has lost response, or may require dosage escalation. Thank you for consulting GI, will follow with you.      Amy Esterwood  03/27/2017, 10:31 AM

## 2017-03-27 NOTE — ED Notes (Signed)
Patient denies pain and is resting comfortably.  

## 2017-03-27 NOTE — H&P (Signed)
[facility] History and Physical  Craig Guzman YDX:[id number] DOB: 07-14-1970 DOA: 03/27/2017  Referring physician  PCP: [name], MD   Chief Complaint:  Intractable nausea vomiting  HPI:   46 year old male with a history of HTN, GERD, Crohn's disease on Humira, migraine, obesity, tobacco abuse who presents to the ED today with epigastric and periumbilical abdominal pain and intermittent nausea vomiting 2-3 days. Patient is followed by. Gatha Mayer, MD. Patient recently had upper endoscopy on 9/18 that showed acquired duodenal stenosis, this was dilated. Patient has previously also had  acute pancreatitis , due to stranding around the pancreas   from the antrum or duodenal bulb. Patient denies any fever, chills, hematochezia, melena ED course BP (!) 171/107 (BP Location: Left Arm)   Pulse 64   Temp 99 F (37.2 C) (Oral)   Resp 18   Ht 6' 1"  (1.854 m)   Wt 132.9 kg (293 lb)   SpO2 100%   BMI 38.66 kg/m  Abdominal KUB shows large amount of stool in the colon, constipation White count 5.6, creatinine 1.37, UA negative Patient was given IV fluids, IV Bentyl, IV Reglan, IV Rocephin EDP discussed with Dr. Henrene Pastor in GI who felt that the patient have a duodenal stricture and he should be readmitted, and may need another endoscopy     Review of Systems: negative for the following   Complete 12 point review of systems was done with pertinent positives in history of present illness    Past Medical History:  Diagnosis Date  . Allergy    trees/ grasses/ animals/dust/mold  . Anxiety   . Biliary dyskinesia   . Colloid thyroid nodule   . Crohn's disease ([place])    Stomach, terminal ileum, cecum  . Gastric ulcer    antral  . GERD (gastroesophageal reflux disease)   . History of migraine headaches   . HTN (hypertension)   . Kidney stone 09/20/2012  . Migraine   . Obesity   . Pneumonia 02/27/2014   ED     Past Surgical History:  Procedure Laterality Date  .  CHOLECYSTECTOMY  2011   Rosenbower  . COLONOSCOPY  07/26/11   Crohn's colitis, ileitis  . ESOPHAGOGASTRODUODENOSCOPY  05/04/11, 07/26/11   granulomatous gastritis - Crohn's  . FLEXIBLE SIGMOIDOSCOPY    . JOINT REPLACEMENT    . MULTIPLE TOOTH EXTRACTIONS  2005  . SHOULDER SURGERY     right  . VASECTOMY     bilateral w/lysis of penile adhesions      Social History:  reports that he has been smoking Cigarettes.  He has a 15.75 pack-year smoking history. He has quit using smokeless tobacco. His smokeless tobacco use included Snuff. He reports that he does not drink alcohol or use drugs.    Allergies  Allergen Reactions  . Zithromax [Azithromycin] Hypertension    Family History  Problem Relation Age of Onset  . Colon polyps Father   . Diabetes Father   . Hypertension Father   . Hypertension Mother   . Asthma Mother   . Heart disease Maternal Grandmother   . Colon cancer Neg Hx   . Rectal cancer Neg Hx   . Stomach cancer Neg Hx   . Esophageal cancer Neg Hx         Prior to Admission medications   Medication Sig Start Date End Date Taking? Authorizing Provider  Adalimumab (HUMIRA PEN) 40 MG/0.8ML PNKT Inject 40 mg into the skin every 14 (fourteen) days. After completion of  the starter 06/06/16  Yes Gatha Mayer, MD  cyclobenzaprine (FLEXERIL) 10 MG tablet Take 1 tablet (10 mg total) by mouth 3 (three) times daily as needed (for migraines). 10/09/16  Yes Ward Givens, NP  doxazosin (CARDURA) 2 MG tablet Take 1 tablet (2 mg total) by mouth daily. 01/13/17  Yes [name], MD  lisinopril (PRINIVIL,ZESTRIL) 40 MG tablet Take 1 tablet (40 mg total) by mouth daily. 01/13/17  Yes [name], MD  omeprazole-sodium bicarbonate (ZEGERID) 40-1100 MG capsule Take 1 capsule by mouth daily as needed. Heart Burn   Yes [provider]  ondansetron (ZOFRAN-ODT) 4 MG disintegrating tablet Take 1 tablet (4 mg total) by mouth every 8 (eight) hours as needed for nausea or  vomiting. 03/19/17  Yes Gatha Mayer, MD  prochlorperazine (COMPAZINE) 25 MG suppository Place 1 suppository (25 mg total) rectally every 12 (twelve) hours as needed for nausea or vomiting. 03/19/17  Yes Gatha Mayer, MD  rizatriptan (MAXALT-MLT) 10 MG disintegrating tablet Take 1 tablet (10 mg total) by mouth 3 (three) times daily as needed for migraine. 10/09/16  Yes Ward Givens, NP  verapamil (CALAN-SR) 180 MG CR tablet Take 2 tablets (360 mg total) by mouth daily. 01/13/17  Yes [name], MD     Physical Exam: Vitals:   03/26/17 1816 03/26/17 1832 03/27/17 0443 03/27/17 0713  BP: (!) 170/117 (!) 144/103 (!) 171/107 (!) 160/93  Pulse: 60  64 (!) 58  Resp: 20  18 18   Temp: 99 F (37.2 C)     TempSrc: Oral     SpO2: 99%  100% 100%  Weight: 132.9 kg (293 lb)     Height: 6' 1"  (1.854 m)           Vitals:   03/26/17 1816 03/26/17 1832 03/27/17 0443 03/27/17 0713  BP: (!) 170/117 (!) 144/103 (!) 171/107 (!) 160/93  Pulse: 60  64 (!) 58  Resp: 20  18 18   Temp: 99 F (37.2 C)     TempSrc: Oral     SpO2: 99%  100% 100%  Weight: 132.9 kg (293 lb)     Height: 6' 1"  (1.854 m)      Constitutional: NAD, calm, comfortable Eyes: PERRL, lids and conjunctivae normal ENMT: Mucous membranes are moist. Posterior pharynx clear of any exudate or lesions.Normal dentition.  Neck: normal, supple, no masses, no thyromegaly Respiratory: clear to auscultation bilaterally, no wheezing, no crackles. Normal respiratory effort. No accessory muscle use.  Cardiovascular: Regular rate and rhythm, no murmurs / rubs / gallops. No extremity edema. 2+ pedal pulses. No carotid bruits.  Abdominal: Soft. Normal appearance and bowel sounds are normal. He exhibits no distension. There is tenderness in the epigastric area Musculoskeletal: no clubbing / cyanosis. No joint deformity upper and lower extremities. Good ROM, no contractures. Normal muscle tone.  Skin: no rashes, lesions, ulcers. No  induration Neurologic: CN 2-12 grossly intact. Sensation intact, DTR normal. Strength 5/5 in all 4.  Psychiatric: Normal judgment and insight. Alert and oriented x 3. Normal mood.     Labs on Admission: I have personally reviewed following labs and imaging studies  CBC:  Recent Labs Lab 03/26/17 1839  WBC 5.6  HGB 16.0  HCT 44.7  MCV 81.0  PLT 161    Basic Metabolic Panel:  Recent Labs Lab 03/26/17 1839  NA 140  K 3.8  CL 107  CO2 25  GLUCOSE 97  BUN 12  CREATININE 1.37*  CALCIUM 8.8*  GFR: Estimated Creatinine Clearance: 97.4 mL/min (A) (by C-G formula based on SCr of 1.37 mg/dL (H)).  Liver Function Tests:  Recent Labs Lab 03/26/17 1839  AST 48*  ALT 50  ALKPHOS 151*  BILITOT 0.6  PROT 7.8  ALBUMIN 3.8    Recent Labs Lab 03/26/17 1839  LIPASE 26   No results for input(s): AMMONIA in the last 168 hours.  Coagulation Profile: No results for input(s): INR, PROTIME in the last 168 hours. No results for input(s): DDIMER in the last 72 hours.  Cardiac Enzymes:  Recent Labs Lab 03/27/17 0622  CKTOTAL 191    BNP (last 3 results) No results for input(s): PROBNP in the last 8760 hours.  HbA1C: No results for input(s): HGBA1C in the last 72 hours. No results found for: HGBA1C   CBG: No results for input(s): GLUCAP in the last 168 hours.  Lipid Profile: No results for input(s): CHOL, HDL, LDLCALC, TRIG, CHOLHDL, LDLDIRECT in the last 72 hours.  Thyroid Function Tests: No results for input(s): TSH, T4TOTAL, FREET4, T3FREE, THYROIDAB in the last 72 hours.  Anemia Panel: No results for input(s): VITAMINB12, FOLATE, FERRITIN, TIBC, IRON, RETICCTPCT in the last 72 hours.  Urine analysis:    Component Value Date/Time   COLORURINE AMBER (A) 03/27/2017 0600   APPEARANCEUR HAZY (A) 03/27/2017 0600   LABSPEC 1.030 03/27/2017 0600   PHURINE 5.0 03/27/2017 0600   GLUCOSEU NEGATIVE 03/27/2017 0600   HGBUR NEGATIVE 03/27/2017 0600    BILIRUBINUR SMALL (A) 03/27/2017 0600   BILIRUBINUR small (A) 03/25/2016 1327   BILIRUBINUR small 12/01/2014 2106   KETONESUR 5 (A) 03/27/2017 0600   PROTEINUR 100 (A) 03/27/2017 0600   UROBILINOGEN 1.0 03/25/2016 1327   UROBILINOGEN 1.0 03/18/2015 0038   NITRITE NEGATIVE 03/27/2017 0600   LEUKOCYTESUR TRACE (A) 03/27/2017 0600    Sepsis Labs: @LABRCNTIP (procalcitonin:4,lacticidven:4) )No results found for this or any previous visit (from the past 240 hour(s)).       Radiological Exams on Admission: Dg Abdomen Acute W/chest  Result Date: 03/27/2017 CLINICAL DATA:  Abdominal pain. Vomiting. Recent endoscopy. History Crohn's disease. EXAM: DG ABDOMEN ACUTE W/ 1V CHEST COMPARISON:  CT 06/24/2016. CT 05/28/2016. Chest x-ray 12 scratched it abdominal series 12 09/20/2015 . FINDINGS: No acute cardiopulmonary disease. Surgical clips right upper quadrant. Soft tissues are unremarkable. Large amount of stool noted in colon. Constipation cannot be excluded. Tiny lower pelvic calcifications again noted most likely phleboliths. IMPRESSION: 1. No acute cardiopulmonary disease. 2. Large amount of stool in the colon. Constipation cannot be excluded. No acute intra-abdominal abnormality identified . Electronically Signed   By: [place]   On: 03/27/2017 06:30   Dg Abdomen Acute W/chest  Result Date: 03/27/2017 CLINICAL DATA:  Abdominal pain. Vomiting. Recent endoscopy. History Crohn's disease. EXAM: DG ABDOMEN ACUTE W/ 1V CHEST COMPARISON:  CT 06/24/2016. CT 05/28/2016. Chest x-ray 12 scratched it abdominal series 12 09/20/2015 . FINDINGS: No acute cardiopulmonary disease. Surgical clips right upper quadrant. Soft tissues are unremarkable. Large amount of stool noted in colon. Constipation cannot be excluded. Tiny lower pelvic calcifications again noted most likely phleboliths. IMPRESSION: 1. No acute cardiopulmonary disease. 2. Large amount of stool in the colon. Constipation cannot be excluded. No  acute intra-abdominal abnormality identified . Electronically Signed   By: Marcello Moores  Register   On: 03/27/2017 06:30      EKG: Independently reviewed. None  Assessment/Plan Principal Problem:   Crohn's disease with duodenal stricture Status post dilatation on 9/18 Now with recurrent nausea  vomiting GI has been consulted Will keep patient npo Hydrate with IV fluids Low-dose Solu-Medrol     GERD (gastroesophageal reflux disease)-continue PPI    Hypertension-continue home medications with the exception of ACE inhibitor, add prn hydralazine     Morbid obesity (HCC)-Body mass index is 38.66 kg/m.     DVT prophylaxis:  Lovenox     Code Status Orders full code         Start     Ordered   03/27/17 0810  Full code  Continuous     03/27/17 0810    Code Status History    Date Active Date Inactive Code Status Order ID Comments User Context   06/24/2016  4:20 AM 06/26/2016  5:15 PM Full Code [id number]  Edwin Dada, MD Inpatient       consults called: GI   Family Communication: Admission, patients condition and plan of care including tests being ordered have been discussed with the patient  who indicates understanding and agree with the plan and Code Status  Admission status: inpatient    Disposition plan: Further plan will depend as patient's clinical course evolves and further radiologic and laboratory data become available. Likely home when stable   At the time of admission, it appears that the appropriate admission status for this patient is INPATIENT .Thisis judged to be reasonable and necessary in order to provide the required intensity of service to ensure the patient's safetygiven thepresenting symptoms, physical exam findings, and initial radiographic and laboratory data in the context of their chronic comorbidities.   Reyne Dumas MD [facility] Pager [phone]  If 7PM-7AM, please contact night-coverage [email or web address] Password  [email or web address]  03/27/2017, 8:11 AM

## 2017-03-27 NOTE — ED Provider Notes (Signed)
[facility] DEPT Provider Note   CSN: [id number] Arrival date & time: 03/26/17  1608  Time seen 05:36 AM   History   Chief Complaint Chief Complaint  Patient presents with  . Abdominal Pain  . Emesis  . Headache    HPI Craig Guzman is a 46 y.o. male.  HPI  Patient states he has been having some persistent epigastric pain and feeling like his food isn't digesting for a long time. He had endoscopy about a week ago and had a stricture of the duodenal bulb that was dilated. He states he felt well until September 21 when he started having epigastric abdominal discomfort with nausea. He states he started vomiting yesterday and vomited about 7 times and states it was just stomach acid. However he does not have a feeling of burning in his abdomen. He relates his epigastric pain to Crohn's disease however he states he doesn't have diarrhea or rectal bleeding. He denies fever. He states he feels like he gets spasms of pain in his epigastric area.He states he has been on Humira for about 2 years and he last got an injection 2 days ago. He states he feels like it is not working as well as it used to. He denies any abdominal bloating.he states he called his gastroenterologist office yesterday afternoon and was told to come to the ED to get a x-ray to make sure he did not have an obstruction.Patient also notes that when he has been urinating his urine has been extremely dark.  When patient arrived he was having a headache and he states he does have migraine headaches however he has been in the ED so long he states his headache is gone.  PCP [name], MD GI Dr Carlean Purl  Past Medical History:  Diagnosis Date  . Allergy    trees/ grasses/ animals/dust/mold  . Anxiety   . Biliary dyskinesia   . Colloid thyroid nodule   . Crohn's disease ([place])    Stomach, terminal ileum, cecum  . Gastric ulcer    antral  . GERD (gastroesophageal reflux disease)   . History of migraine headaches   .  HTN (hypertension)   . Kidney stone 09/20/2012  . Migraine   . Obesity   . Pneumonia 02/27/2014   ED    Patient Active Problem List   Diagnosis Date Noted  . Acute pancreatitis 06/24/2016  . Cigarette nicotine dependence without complication 44/08/4740  . Shifting sleep-work schedule, affecting sleep 05/15/2016  . Sleep deprivation 05/15/2016  . Sleep related hypoventilation in conditions classified elsewhere 05/15/2016  . Sleep related headaches 05/15/2016  . Morbid obesity ([place]) 09/19/2013  . Diarrhea 07/10/2013  . 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 Hypertension 11/26/2012  . Gastroparesis??? 12/08/2011  . Common migraine without aura 12/08/2011  . Long-term use of immunosuppressant medication- Cimzia 08/08/2011  . Crohn's disease of stomach and colon-suspected 05/17/2011  . GERD (gastroesophageal reflux disease) 04/21/2011    Past Surgical History:  Procedure Laterality Date  . CHOLECYSTECTOMY  2011   Rosenbower  . COLONOSCOPY  07/26/11   Crohn's colitis, ileitis  . ESOPHAGOGASTRODUODENOSCOPY  05/04/11, 07/26/11   granulomatous gastritis - Crohn's  . FLEXIBLE SIGMOIDOSCOPY    . JOINT REPLACEMENT    . MULTIPLE TOOTH EXTRACTIONS  2005  . SHOULDER SURGERY     right  . VASECTOMY     bilateral w/lysis of penile adhesions       Home Medications    Prior to Admission medications   Medication  Sig Start Date End Date Taking? Authorizing Provider  Adalimumab (HUMIRA PEN) 40 MG/0.8ML PNKT Inject 40 mg into the skin every 14 (fourteen) days. After completion of the starter 06/06/16  Yes Gatha Mayer, MD  cyclobenzaprine (FLEXERIL) 10 MG tablet Take 1 tablet (10 mg total) by mouth 3 (three) times daily as needed (for migraines). 10/09/16  Yes Ward Givens, NP  doxazosin (CARDURA) 2 MG tablet Take 1 tablet (2 mg total) by mouth daily. 01/13/17  Yes [name], MD  lisinopril (PRINIVIL,ZESTRIL) 40 MG tablet Take 1 tablet (40 mg total) by mouth daily. 01/13/17  Yes  [name], MD  omeprazole-sodium bicarbonate (ZEGERID) 40-1100 MG capsule Take 1 capsule by mouth daily as needed. Heart Burn   Yes [provider]  ondansetron (ZOFRAN-ODT) 4 MG disintegrating tablet Take 1 tablet (4 mg total) by mouth every 8 (eight) hours as needed for nausea or vomiting. 03/19/17  Yes Gatha Mayer, MD  prochlorperazine (COMPAZINE) 25 MG suppository Place 1 suppository (25 mg total) rectally every 12 (twelve) hours as needed for nausea or vomiting. 03/19/17  Yes Gatha Mayer, MD  rizatriptan (MAXALT-MLT) 10 MG disintegrating tablet Take 1 tablet (10 mg total) by mouth 3 (three) times daily as needed for migraine. 10/09/16  Yes Ward Givens, NP  verapamil (CALAN-SR) 180 MG CR tablet Take 2 tablets (360 mg total) by mouth daily. 01/13/17  Yes [name], MD    Family History Family History  Problem Relation Age of Onset  . Colon polyps Father   . Diabetes Father   . Hypertension Father   . Hypertension Mother   . Asthma Mother   . Heart disease Maternal Grandmother   . Colon cancer Neg Hx   . Rectal cancer Neg Hx   . Stomach cancer Neg Hx   . Esophageal cancer Neg Hx     Social History Social History  Substance Use Topics  . Smoking status: Current Every Day Smoker    Packs/day: 0.75    Years: 21.00    Types: Cigarettes  . Smokeless tobacco: Former Systems developer    Types: Snuff     Comment: Counseling sheet given in exam room   . Alcohol use No  employed Lives with spouse   Allergies   Zithromax [azithromycin]   Review of Systems Review of Systems  All other systems reviewed and are negative.    Physical Exam Updated Vital Signs BP (!) 171/107 (BP Location: Left Arm)   Pulse 64   Temp 99 F (37.2 C) (Oral)   Resp 18   Ht 6' 1"  (1.854 m)   Wt 132.9 kg (293 lb)   SpO2 100%   BMI 38.66 kg/m   Vital signs normal except for hypertension   Physical Exam  Constitutional: He is oriented to person, place, and time. He  appears well-developed and well-nourished.  Non-toxic appearance. He does not appear ill. No distress.  HENT:  Head: Normocephalic and atraumatic.  Right Ear: External ear normal.  Left Ear: External ear normal.  Nose: Nose normal. No mucosal edema or rhinorrhea.  Mouth/Throat: Mucous membranes are dry. No dental abscesses or uvula swelling.  Eyes: Pupils are equal, round, and reactive to light. Conjunctivae and EOM are normal.  Neck: Normal range of motion and full passive range of motion without pain. Neck supple.  Cardiovascular: Normal rate, regular rhythm and normal heart sounds.  Exam reveals no gallop and no friction rub.   No murmur heard. Pulmonary/Chest: Effort  normal and breath sounds normal. No respiratory distress. He has no wheezes. He has no rhonchi. He has no rales. He exhibits no tenderness and no crepitus.  Abdominal: Soft. Normal appearance and bowel sounds are normal. He exhibits no distension. There is tenderness in the epigastric area. There is no rebound and no guarding.  Musculoskeletal: Normal range of motion. He exhibits no edema or tenderness.  Moves all extremities well.   Neurological: He is alert and oriented to person, place, and time. He has normal strength. No cranial nerve deficit.  Skin: Skin is warm, dry and intact. No rash noted. No erythema. No pallor.  Psychiatric: He has a normal mood and affect. His speech is normal and behavior is normal. His mood appears not anxious.  Nursing note and vitals reviewed.    ED Treatments / Results  Labs (all labs ordered are listed, but only abnormal results are displayed) Results for orders placed or performed during the hospital encounter of 03/27/17  Lipase, blood  Result Value Ref Range   Lipase 26 11 - 51 U/L  Comprehensive metabolic panel  Result Value Ref Range   Sodium 140 135 - 145 mmol/L   Potassium 3.8 3.5 - 5.1 mmol/L   Chloride 107 101 - 111 mmol/L   CO2 25 22 - 32 mmol/L   Glucose, Bld 97 65 -  99 mg/dL   BUN 12 6 - 20 mg/dL   Creatinine, Ser 1.37 (H) 0.61 - 1.24 mg/dL   Calcium 8.8 (L) 8.9 - 10.3 mg/dL   Total Protein 7.8 6.5 - 8.1 g/dL   Albumin 3.8 3.5 - 5.0 g/dL   AST 48 (H) 15 - 41 U/L   ALT 50 17 - 63 U/L   Alkaline Phosphatase 151 (H) 38 - 126 U/L   Total Bilirubin 0.6 0.3 - 1.2 mg/dL   GFR calc non Af Amer >60 >60 mL/min   GFR calc Af Amer >60 >60 mL/min   Anion gap 8 5 - 15  CBC  Result Value Ref Range   WBC 5.6 4.0 - 10.5 K/uL   RBC 5.52 4.22 - 5.81 MIL/uL   Hemoglobin 16.0 13.0 - 17.0 g/dL   HCT 44.7 39.0 - 52.0 %   MCV 81.0 78.0 - 100.0 fL   MCH 29.0 26.0 - 34.0 pg   MCHC 35.8 30.0 - 36.0 g/dL   RDW 13.6 11.5 - 15.5 %   Platelets 212 150 - 400 K/uL  Urinalysis, Routine w reflex microscopic  Result Value Ref Range   Color, Urine AMBER (A) YELLOW   APPearance HAZY (A) CLEAR   Specific Gravity, Urine 1.030 1.005 - 1.030   pH 5.0 5.0 - 8.0   Glucose, UA NEGATIVE NEGATIVE mg/dL   Hgb urine dipstick NEGATIVE NEGATIVE   Bilirubin Urine SMALL (A) NEGATIVE   Ketones, ur 5 (A) NEGATIVE mg/dL   Protein, ur 100 (A) NEGATIVE mg/dL   Nitrite NEGATIVE NEGATIVE   Leukocytes, UA TRACE (A) NEGATIVE   RBC / HPF 6-30 0 - 5 RBC/hpf   WBC, UA 6-30 0 - 5 WBC/hpf   Bacteria, UA RARE (A) NONE SEEN   Squamous Epithelial / LPF 0-5 (A) NONE SEEN   Mucus PRESENT    Hyaline Casts, UA PRESENT    Laboratory interpretation all normal except new renal insufficiency,  Possible UTI, urine culture sent.     EKG  EKG Interpretation None       Radiology Dg Abdomen Acute W/chest  Result Date: 03/27/2017 CLINICAL  DATA:  Abdominal pain. Vomiting. Recent endoscopy. History Crohn's disease. EXAM: DG ABDOMEN ACUTE W/ 1V CHEST COMPARISON:  CT 06/24/2016. CT 05/28/2016. Chest x-ray 12 scratched it abdominal series 12 09/20/2015 . FINDINGS: No acute cardiopulmonary disease. Surgical clips right upper quadrant. Soft tissues are unremarkable. Large amount of stool noted in colon.  Constipation cannot be excluded. Tiny lower pelvic calcifications again noted most likely phleboliths. IMPRESSION: 1. No acute cardiopulmonary disease. 2. Large amount of stool in the colon. Constipation cannot be excluded. No acute intra-abdominal abnormality identified . Electronically Signed   By: Marcello Moores  Register   On: 03/27/2017 06:30    Procedures Procedures (including critical care time)  Medications Ordered in ED Medications  cefTRIAXone (ROCEPHIN) 1 g in dextrose 5 % 50 mL IVPB (1 g Intravenous New Bag/Given 03/27/17 0705)  sodium chloride 0.9 % bolus 1,000 mL (1,000 mLs Intravenous New Bag/Given 03/27/17 2595)  sodium chloride 0.9 % bolus 1,000 mL (1,000 mLs Intravenous New Bag/Given 03/27/17 6387)  dicyclomine (BENTYL) injection 20 mg (20 mg Intramuscular Given 03/27/17 0639)  metoCLOPramide (REGLAN) injection 10 mg (10 mg Intravenous Given 03/27/17 5643)     Initial Impression / Assessment and Plan / ED Course  I have reviewed the triage vital signs and the nursing notes.  Pertinent labs & imaging results that were available during my care of the patient were reviewed by me and considered in my medical decision making (see chart for details).     Patient was given IV fluids and given Bentyl and IV Reglan for his epigastric pain. Patient had given a sample and his urine is noted to be extremely dark.  After reviewing patient's urinalysis a urine culture was sent and he was given Rocephin IV. Review of his prior UA shows he has had UTIs in the past.  Recheck it 7 AM, we discussed his UA results. Patient had just got his medications. His IV fluids were just been started.  7:09 AM patient discussed with Dr. Henrene Pastor, gastroenterologist. He feels that if patient had a duodenal stricture that he should be admitted to the hospital, kept nothing by mouth, and they will consult on him later today.  Patient informed of Dr.Islam's recommendations and is agreeable.  07:17 AM Dr Allyson Sabal,  hospitalist will admit.   Final Clinical Impressions(s) / ED Diagnoses   Final diagnoses:  Intractable vomiting with nausea, unspecified vomiting type  Duodenal stricture  Urinary tract infection with hematuria, site unspecified    Plan admission  Rolland Porter, MD, Barbette Or, MD 03/27/17 [phone]

## 2017-03-28 ENCOUNTER — Encounter (HOSPITAL_COMMUNITY): Payer: Self-pay | Admitting: *Deleted

## 2017-03-28 ENCOUNTER — Inpatient Hospital Stay (HOSPITAL_COMMUNITY): Payer: Managed Care, Other (non HMO) | Admitting: Anesthesiology

## 2017-03-28 ENCOUNTER — Encounter (HOSPITAL_COMMUNITY)
Admission: EM | Disposition: A | Discharge status: Home / Self Care | Payer: Self-pay | Source: Home / Self Care | Attending: Internal Medicine

## 2017-03-28 HISTORY — PX: ESOPHAGOGASTRODUODENOSCOPY (EGD) WITH PROPOFOL: SHX5813

## 2017-03-28 LAB — COMPREHENSIVE METABOLIC PANEL
ALK PHOS: 116 U/L (ref 38–126)
ALT: 42 U/L (ref 17–63)
ANION GAP: 8 (ref 5–15)
AST: 29 U/L (ref 15–41)
Albumin: 3.1 g/dL — ABNORMAL LOW (ref 3.5–5.0)
BUN: 11 mg/dL (ref 6–20)
CALCIUM: 8.5 mg/dL — AB (ref 8.9–10.3)
CO2: 20 mmol/L — AB (ref 22–32)
CREATININE: 0.88 mg/dL (ref 0.61–1.24)
Chloride: 109 mmol/L (ref 101–111)
Glucose, Bld: 106 mg/dL — ABNORMAL HIGH (ref 65–99)
Potassium: 4.3 mmol/L (ref 3.5–5.1)
SODIUM: 137 mmol/L (ref 135–145)
Total Bilirubin: 0.6 mg/dL (ref 0.3–1.2)
Total Protein: 6.8 g/dL (ref 6.5–8.1)

## 2017-03-28 LAB — URINE CULTURE
Culture: NO GROWTH
Special Requests: NORMAL

## 2017-03-28 LAB — CBC
HCT: 40.2 % (ref 39.0–52.0)
HEMOGLOBIN: 14.1 g/dL (ref 13.0–17.0)
MCH: 28.3 pg (ref 26.0–34.0)
MCHC: 35.1 g/dL (ref 30.0–36.0)
MCV: 80.7 fL (ref 78.0–100.0)
PLATELETS: 181 10*3/uL (ref 150–400)
RBC: 4.98 MIL/uL (ref 4.22–5.81)
RDW: 13.6 % (ref 11.5–15.5)
WBC: 7.8 10*3/uL (ref 4.0–10.5)

## 2017-03-28 SURGERY — ESOPHAGOGASTRODUODENOSCOPY (EGD) WITH PROPOFOL
Anesthesia: Monitor Anesthesia Care

## 2017-03-28 MED ORDER — PROPOFOL 10 MG/ML IV BOLUS
INTRAVENOUS | Status: AC
  Filled 2017-03-28: qty 20

## 2017-03-28 MED ORDER — PROPOFOL 500 MG/50ML IV EMUL
INTRAVENOUS | Status: DC | PRN
  Administered 2017-03-28: 125 ug/kg/min via INTRAVENOUS

## 2017-03-28 MED ORDER — LACTATED RINGERS IV SOLN
INTRAVENOUS | Status: DC
  Administered 2017-03-28 (×2): via INTRAVENOUS

## 2017-03-28 MED ORDER — PROPOFOL 10 MG/ML IV BOLUS
INTRAVENOUS | Status: DC | PRN
  Administered 2017-03-28 (×2): 30 mg via INTRAVENOUS
  Administered 2017-03-28 (×3): 20 mg via INTRAVENOUS

## 2017-03-28 MED ORDER — ONDANSETRON HCL 4 MG/2ML IJ SOLN
INTRAMUSCULAR | Status: AC
  Filled 2017-03-28: qty 2

## 2017-03-28 MED ORDER — PROPOFOL 10 MG/ML IV BOLUS
INTRAVENOUS | Status: AC
  Filled 2017-03-28: qty 60

## 2017-03-28 SURGICAL SUPPLY — 14 items

## 2017-03-28 NOTE — Progress Notes (Addendum)
Patient ID: Craig Guzman, male   DOB: [birth year], 46 y.o.   MRN: [id number]    PROGRESS NOTE  Anoop Hemmer  BUL:[id number] DOB: Feb 08, 1971 DOA: 03/27/2017  PCP: [name], MD   Brief Narrative:  46 year old male with a history of HTN, GERD, Crohn's disease on Humira, migraine, obesity, tobacco abuse who presented to the ED with epigastric and periumbilical abdominal pain, intermittent nausea vomiting  2-3 days.  Assessment & Plan:   Principal Problem:   Crohn's disease of stomach and colon-suspected - with duodenal stricture  - s/p dilatation on 9/18 - GI consulted and plan for EGD today   Active Problems:   GERD (gastroesophageal reflux disease) - keep on PPI    Hypertension, essential - reasonable control     Morbid obesity ([place]) - Body mass index is 38.26 kg/m.  DVT prophylaxis: SCD's Code Status: Full  Family Communication: Patient at bedside  Disposition Plan: to be determined   Consultants:   GI  Procedures:   EGD today   Antimicrobials:   None  Subjective: Pt reports feeling better this AM.  Objective: Vitals:   03/27/17 2141 03/28/17 0630 03/28/17 0820 03/28/17 1300  BP: (!) 152/83 (!) 143/73 (!) 145/83 (!) 167/92  Pulse: (!) 56 (!) 50 61 (!) 53  Resp: 20 18 18 16   Temp: 98 F (36.7 C) 97.7 F (36.5 C) 98 F (36.7 C) 98.1 F (36.7 C)  TempSrc: Oral Oral Oral Oral  SpO2: 97% 100% 100% 98%  Weight:      Height:        Intake/Output Summary (Last 24 hours) at 03/28/17 1317 Last data filed at 03/27/17 1800  Gross per 24 hour  Intake          1166.25 ml  Output                0 ml  Net          1166.25 ml   Filed Weights   03/26/17 1816 03/27/17 0938  Weight: 132.9 kg (293 lb) 131.5 kg (290 lb)    Examination:  General exam: Appears calm and comfortable  Respiratory system: Clear to auscultation. Respiratory effort normal. Cardiovascular system: S1 & S2 heard, RRR. No JVD, murmurs, rubs, gallops or clicks. No pedal  edema. Gastrointestinal system: Abdomen is nondistended, soft and nontender. No organomegaly or masses felt. Normal bowel sounds heard. Central nervous system: Alert and oriented. No focal neurological deficits. Extremities: Symmetric 5 x 5 power. Skin: No rashes, lesions or ulcers Psychiatry: Judgement and insight appear normal. Mood & affect appropriate.    Data Reviewed: I have personally reviewed following labs and imaging studies  CBC:  Recent Labs Lab 03/26/17 1839 03/27/17 0914 03/28/17 0532  WBC 5.6 4.8 7.8  HGB 16.0 14.0 14.1  HCT 44.7 40.6 40.2  MCV 81.0 83.0 80.7  PLT 212 177 321   Basic Metabolic Panel:  Recent Labs Lab 03/26/17 1839 03/27/17 0914 03/28/17 0532  NA 140  --  137  K 3.8  --  4.3  CL 107  --  109  CO2 25  --  20*  GLUCOSE 97  --  106*  BUN 12  --  11  CREATININE 1.37* 1.14 0.88  CALCIUM 8.8*  --  8.5*  MG  --  2.1  --    Liver Function Tests:  Recent Labs Lab 03/26/17 1839 03/28/17 0532  AST 48* 29  ALT 50 42  ALKPHOS 151* 116  BILITOT 0.6 0.6  PROT 7.8 6.8  ALBUMIN 3.8 3.1*    Recent Labs Lab 03/26/17 1839  LIPASE 26  Cardiac Enzymes:  Recent Labs Lab 03/27/17 0622  CKTOTAL 191   Urine analysis:    Component Value Date/Time   COLORURINE AMBER (A) 03/27/2017 0600   APPEARANCEUR HAZY (A) 03/27/2017 0600   LABSPEC 1.030 03/27/2017 0600   PHURINE 5.0 03/27/2017 0600   GLUCOSEU NEGATIVE 03/27/2017 0600   HGBUR NEGATIVE 03/27/2017 0600   BILIRUBINUR SMALL (A) 03/27/2017 0600   BILIRUBINUR small (A) 03/25/2016 1327   BILIRUBINUR small 12/01/2014 2106   KETONESUR 5 (A) 03/27/2017 0600   PROTEINUR 100 (A) 03/27/2017 0600   UROBILINOGEN 1.0 03/25/2016 1327   UROBILINOGEN 1.0 03/18/2015 0038   NITRITE NEGATIVE 03/27/2017 0600   LEUKOCYTESUR TRACE (A) 03/27/2017 0600    Radiology Studies: Ct Abdomen Pelvis W Contrast  Result Date: 03/27/2017 CLINICAL DATA:  Colonic and upper urinary tract Crohn disease. History of  duodenal stricture with biopsies demonstrating active inflammation. EXAM: CT ABDOMEN AND PELVIS WITH CONTRAST TECHNIQUE: Multidetector CT imaging of the abdomen and pelvis was performed using the standard protocol following bolus administration of intravenous contrast. CONTRAST:  186m ISOVUE-300 IOPAMIDOL (ISOVUE-300) INJECTION 61% COMPARISON:  03/27/2017 plain films.  CT 05/28/2016. FINDINGS: Lower chest: Clear lung bases. Mild cardiomegaly, without pericardial or pleural effusion. Hepatobiliary: Normal liver. Cholecystectomy, without biliary ductal dilatation. Pancreas: Normal, without mass or ductal dilatation. Spleen: Normal in size, without focal abnormality. Adrenals/Urinary Tract: Normal adrenal glands. Normal kidneys, without hydronephrosis. Normal urinary bladder. Stomach/Bowel: The gastric antrum appears thick walled, favored to be due to underdistention. Example image 27/series 2. Normal appearance of the colon. The terminal ileum is identified on image 54/series 2 and is normal. Appendix not visualized. Despite the clinical history, no duodenal inflammation is identified. No wall thickening or surrounding edema. There is suggestion of narrowing versus underdistention involving the mid descending duodenum on coronal image 60. No evidence of gastric outlet obstruction or proximal duodenal dilatation. The remainder of the small bowel is also within normal limits. Vascular/Lymphatic: Aortic and branch vessel atherosclerosis. Multiple borderline to mildly enlarged gastrohepatic ligament nodes. The largest measures 11 mm on image 20/series 2. This is similar to on the prior. No pelvic sidewall adenopathy. Reproductive: Mild prostatomegaly. Other: No significant free fluid. Musculoskeletal: Lumbosacral spondylosis, with degenerative disc disease at L4-5 and L5-S1. IMPRESSION: 1. No evidence of acute gastric or bowel inflammation. 2. Narrowing versus underdistention within the mid descending duodenum. This  could correspond to the area of duodenal stricture detailed on the clinical history. 3. Prominent nodes in the gastrohepatic ligament are likely reactive and similar to 05/28/2016. 4.  Aortic Atherosclerosis (ICD10-I70.0). Electronically Signed   By: KAbigail MiyamotoM.D.   On: 03/27/2017 16:38   Dg Abdomen Acute W/chest  Result Date: 03/27/2017 CLINICAL DATA:  Abdominal pain. Vomiting. Recent endoscopy. History Crohn's disease. EXAM: DG ABDOMEN ACUTE W/ 1V CHEST COMPARISON:  CT 06/24/2016. CT 05/28/2016. Chest x-ray 12 scratched it abdominal series 12 09/20/2015 . FINDINGS: No acute cardiopulmonary disease. Surgical clips right upper quadrant. Soft tissues are unremarkable. Large amount of stool noted in colon. Constipation cannot be excluded. Tiny lower pelvic calcifications again noted most likely phleboliths. IMPRESSION: 1. No acute cardiopulmonary disease. 2. Large amount of stool in the colon. Constipation cannot be excluded. No acute intra-abdominal abnormality identified . Electronically Signed   By: [place]  On: 03/27/2017 06:30    Scheduled Meds: . [MAR Hold] doxazosin  2 mg Oral Daily  . [  MAR Hold] pantoprazole (PROTONIX) IV  40 mg Intravenous Q12H  . [MAR Hold] sodium chloride flush  3 mL Intravenous Q12H  . [MAR Hold] verapamil  180 mg Oral Daily   Continuous Infusions: . 0.9 % NaCl with KCl 20 mEq / L 75 mL/hr at 03/28/17 0033  . lactated ringers 20 mL/hr at 03/28/17 1306     LOS: 1 day   Time spent: 25 minutes   Faye Ramsay, MD [facility] Pager [phone]  If 7PM-7AM, please contact night-coverage [email or web address] Password [email or web address] 03/28/2017, 1:17 PM

## 2017-03-28 NOTE — Op Note (Signed)
[facility] Patient Name: Craig Guzman Procedure Date: 03/28/2017 MRN: [id number] Attending MD: Carlota Raspberry. Loghan Subia MD, MD Date of Birth: 10/06/1970 CSN: [id number] Age: 46 Admit Type: Inpatient Procedure:                Upper GI endoscopy Indications:              upper tract Crohn's disease on Humira, duodenal                            stenosis with recent dilation to 15.30m, recurrent                            nausea with vomiting Providers:                SRemo LippsP. Litzy Dicker MD, MD, DCarmie End RN,                            JCherylynn Ridges Technician Referring MD:              Medicines:                Monitored Anesthesia Care Complications:            No immediate complications. Estimated blood loss:                            Minimal. Estimated Blood Loss:     Estimated blood loss was minimal. Procedure:                Pre-Anesthesia Assessment:                           - Prior to the procedure, a History and Physical                            was performed, and patient medications and                            allergies were reviewed. The patient's tolerance of                            previous anesthesia was also reviewed. The risks                            and benefits of the procedure and the sedation                            options and risks were discussed with the patient.                            All questions were answered, and informed consent                            was obtained. Prior Anticoagulants: The patient has  taken no previous anticoagulant or antiplatelet                            agents. ASA Grade Assessment: III - A patient with                            severe systemic disease. After reviewing the risks                            and benefits, the patient was deemed in                            satisfactory condition to undergo the procedure.                           After obtaining  informed consent, the endoscope was                            passed under direct vision. Throughout the                            procedure, the patient's blood pressure, pulse, and                            oxygen saturations were monitored continuously. The                            Endoscope was introduced through the mouth, and                            advanced to the second part of duodenum. The upper                            GI endoscopy was accomplished without difficulty.                            The patient tolerated the procedure well. Scope In: Scope Out: Findings:      The Z-line was regular.      The exam of the esophagus was otherwise normal.      Patchy inflammation characterized by erosions, friability and       granularity was found in the gastric antrum. Biopsies were taken with a       cold forceps for histology.      The exam of the stomach was otherwise normal.      An acquired benign-appearing, intrinsic severe stenosis was found in the       suspected pyloric channel / duodenal bulb and was traversed. A TTS       dilator was passed through the scope. Dilation with a 15-16.5-18 mm       pyloric balloon dilator was performed, up to 66m with a good result and       appropriate mucosal wrent. Biopsies were taken with a cold forceps for       histology.      The exam of the duodenum was otherwise normal. Impression:               -  Z-line regular.                           - Normal esophagus                           - Gastritis - Crohn's?. Biopsied.                           - Acquired pyloric channel / duodenal stenosis.                            Dilated to 49m with good result. Biopsied. Moderate Sedation:      No moderate sedation, case performed with MAC Recommendation:           - Patient has a contact number available for                            emergencies. The signs and symptoms of potential                            delayed complications  were discussed with the                            patient. Return to normal activities tomorrow.                            Written discharge instructions were provided to the                            patient.                           - Clear liquid diet now, advance as tolerated to                            full liquids. Patient should be on full liquid diet                            for the next several days                           - Continue present medications.                           - Await pathology results. Procedure Code(s):        --- Professional ---                           [id number] Esophagogastroduodenoscopy, flexible,                            transoral; with dilation of gastric/duodenal                            stricture(s) (eg, balloon, bougie)  07371, Esophagogastroduodenoscopy, flexible,                            transoral; with biopsy, single or multiple Diagnosis Code(s):        --- Professional ---                           K29.70, Gastritis, unspecified, without bleeding                           K31.5, Obstruction of duodenum                           K50.90, Crohn's disease, unspecified, without                            complications                           R11.2, Nausea with vomiting, unspecified CPT copyright 2016 American Medical Association. All rights reserved. The codes documented in this report are preliminary and upon coder review may  be revised to meet current compliance requirements. Remo Lipps P. Dasia Guerrier MD, MD 03/28/2017 3:40:09 PM This report has been signed electronically. Number of Addenda: 0

## 2017-03-28 NOTE — Interval H&P Note (Signed)
History and Physical Interval Note:  03/28/2017 1:04 PM  Craig Guzman  has presented today for surgery, with the diagnosis of duodenal stricture  The various methods of treatment have been discussed with the patient and family. After consideration of risks, benefits and other options for treatment, the patient has consented to  Procedure(s): ESOPHAGOGASTRODUODENOSCOPY (EGD) WITH PROPOFOL  with balloon dilation of duodenum (N/A) as a surgical intervention .  The patient's history has been reviewed, patient examined, no change in status, stable for surgery.  I have reviewed the patient's chart and labs.  Questions were answered to the patient's satisfaction.     [place]

## 2017-03-28 NOTE — Anesthesia Procedure Notes (Signed)
Date/Time: 03/28/2017 2:51 PM Performed by: Claudia Desanctis Oxygen Delivery Method: Nasal cannula

## 2017-03-28 NOTE — H&P (View-Only) (Signed)
Patient ID: Craig Guzman, male   DOB: [birth year], 46 y.o.   MRN: [id number]     Progress Note   Subjective   Doing well -no significant pain on clear liquids , and no vomiting  Says at home he has been having lay down after eating and generally only eating once per day recently - he lays on left side which is more comfortable- if lays on right side he hurts.  CT as outlined below    Objective   Vital signs in last 24 hours: Temp:  [97.7 F (36.5 C)-98.2 F (36.8 C)] 98 F (36.7 C) (09/26 0820) Pulse Rate:  [50-63] 61 (09/26 0820) Resp:  [18-20] 18 (09/26 0820) BP: (136-170)/(70-110) 145/83 (09/26 0820) SpO2:  [97 %-100 %] 100 % (09/26 0820) Weight:  [290 lb (131.5 kg)] 290 lb (131.5 kg) (09/25 0938) Last BM Date: 03/27/17 General:   AA male in NAD Heart:  Regular rate and rhythm; no murmurs Lungs: Respirations even and unlabored, lungs CTA bilaterally Abdomen:  Soft,  Min tender and nondistended. Normal bowel sounds. Extremities:  Without edema. Neurologic:  Alert and oriented,  grossly normal neurologically. Psych:  Cooperative. Normal mood and affect.  Intake/Output from previous day: 09/25 0701 - 09/26 0700 In: 1166.3 [P.O.:840; I.V.:326.3] Out: 425 [Urine:425] Intake/Output this shift: No intake/output data recorded.  Lab Results:  Recent Labs  03/26/17 1839 03/27/17 0914 03/28/17 0532  WBC 5.6 4.8 7.8  HGB 16.0 14.0 14.1  HCT 44.7 40.6 40.2  PLT 212 177 181   BMET  Recent Labs  03/26/17 1839 03/27/17 0914 03/28/17 0532  NA 140  --  137  K 3.8  --  4.3  CL 107  --  109  CO2 25  --  20*  GLUCOSE 97  --  106*  BUN 12  --  11  CREATININE 1.37* 1.14 0.88  CALCIUM 8.8*  --  8.5*   LFT  Recent Labs  03/28/17 0532  PROT 6.8  ALBUMIN 3.1*  AST 29  ALT 42  ALKPHOS 116  BILITOT 0.6   PT/INR No results for input(s): LABPROT, INR in the last 72 hours.  Studies/Results: Ct Abdomen Pelvis W Contrast  Result Date: 03/27/2017 CLINICAL DATA:   Colonic and upper urinary tract Crohn disease. History of duodenal stricture with biopsies demonstrating active inflammation. EXAM: CT ABDOMEN AND PELVIS WITH CONTRAST TECHNIQUE: Multidetector CT imaging of the abdomen and pelvis was performed using the standard protocol following bolus administration of intravenous contrast. CONTRAST:  130m ISOVUE-300 IOPAMIDOL (ISOVUE-300) INJECTION 61% COMPARISON:  03/27/2017 plain films.  CT 05/28/2016. FINDINGS: Lower chest: Clear lung bases. Mild cardiomegaly, without pericardial or pleural effusion. Hepatobiliary: Normal liver. Cholecystectomy, without biliary ductal dilatation. Pancreas: Normal, without mass or ductal dilatation. Spleen: Normal in size, without focal abnormality. Adrenals/Urinary Tract: Normal adrenal glands. Normal kidneys, without hydronephrosis. Normal urinary bladder. Stomach/Bowel: The gastric antrum appears thick walled, favored to be due to underdistention. Example image 27/series 2. Normal appearance of the colon. The terminal ileum is identified on image 54/series 2 and is normal. Appendix not visualized. Despite the clinical history, no duodenal inflammation is identified. No wall thickening or surrounding edema. There is suggestion of narrowing versus underdistention involving the mid descending duodenum on coronal image 60. No evidence of gastric outlet obstruction or proximal duodenal dilatation. The remainder of the small bowel is also within normal limits. Vascular/Lymphatic: Aortic and branch vessel atherosclerosis. Multiple borderline to mildly enlarged gastrohepatic ligament nodes. The largest measures 11 mm on  image 20/series 2. This is similar to on the prior. No pelvic sidewall adenopathy. Reproductive: Mild prostatomegaly. Other: No significant free fluid. Musculoskeletal: Lumbosacral spondylosis, with degenerative disc disease at L4-5 and L5-S1. IMPRESSION: 1. No evidence of acute gastric or bowel inflammation. 2. Narrowing versus  underdistention within the mid descending duodenum. This could correspond to the area of duodenal stricture detailed on the clinical history. 3. Prominent nodes in the gastrohepatic ligament are likely reactive and similar to 05/28/2016. 4.  Aortic Atherosclerosis (ICD10-I70.0). Electronically Signed   By: Abigail Miyamoto M.D.   On: 03/27/2017 16:38   Dg Abdomen Acute W/chest  Result Date: 03/27/2017 CLINICAL DATA:  Abdominal pain. Vomiting. Recent endoscopy. History Crohn's disease. EXAM: DG ABDOMEN ACUTE W/ 1V CHEST COMPARISON:  CT 06/24/2016. CT 05/28/2016. Chest x-ray 12 scratched it abdominal series 12 09/20/2015 . FINDINGS: No acute cardiopulmonary disease. Surgical clips right upper quadrant. Soft tissues are unremarkable. Large amount of stool noted in colon. Constipation cannot be excluded. Tiny lower pelvic calcifications again noted most likely phleboliths. IMPRESSION: 1. No acute cardiopulmonary disease. 2. Large amount of stool in the colon. Constipation cannot be excluded. No acute intra-abdominal abnormality identified . 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  Pt is symptomatic with recurrent epigastric pain worse post prandially and vomiting CT yesterday does not show any active Crohn"s  Anywhere else in  Small or large bowel , and interestingly no significant inflammation at level of distal duodenum, just narrowing  Plan;  Pt is scheduled for EGD with repeat balloon dilation of duodenal stricture today .  Will need to stay on full liquid to pureed diet  With frequent small meals   Plan to check Humira AB level and drug level next Friday 10/ 5 -  He may  need surgery if not responding to dilations  Discharge Planning Contact  Amy Greycliff, P.A.-C               [phone]      Principal Problem:   Crohn's disease of stomach  and colon-suspected Active Problems:   GERD (gastroesophageal reflux disease)   Hypertension   Morbid obesity ([place])   Exacerbation of Crohn's disease ([place])     LOS: 1 day   Amy Esterwood  03/28/2017, 8:56 AM

## 2017-03-28 NOTE — Transfer of Care (Signed)
Immediate Anesthesia Transfer of Care Note  Patient: Craig Guzman  Procedure(s) Performed: Procedure(s): ESOPHAGOGASTRODUODENOSCOPY (EGD) WITH PROPOFOL  with balloon dilation of duodenum (N/A)  Patient Location: PACU  Anesthesia Type:MAC  Level of Consciousness:  sedated, patient cooperative and responds to stimulation  Airway & Oxygen Therapy:Patient Spontanous Breathing and Patient connected to face mask oxgen  Post-op Assessment:  Report given to PACU RN and Post -op Vital signs reviewed and stable  Post vital signs:  Reviewed and stable  Last Vitals:  Vitals:   03/28/17 0820 03/28/17 1300  BP: (!) 145/83 (!) 167/92  Pulse: 61 (!) 53  Resp: 18 16  Temp: 36.7 C 36.7 C  SpO2: 295% 62%    Complications: No apparent anesthesia complications

## 2017-03-28 NOTE — Anesthesia Preprocedure Evaluation (Signed)
Anesthesia Evaluation  Patient identified by MRN, date of birth, ID band Patient awake    Reviewed: Allergy & Precautions, NPO status , Patient's Chart, lab work & pertinent test results  Airway Mallampati: II  TM Distance: >3 FB Neck ROM: Full    Dental no notable dental hx.    Pulmonary pneumonia, Current Smoker,    Pulmonary exam normal breath sounds clear to auscultation       Cardiovascular hypertension, Normal cardiovascular exam Rhythm:Regular Rate:Normal     Neuro/Psych  Headaches, negative neurological ROS  negative psych ROS   GI/Hepatic Neg liver ROS, hiatal hernia, PUD, GERD  ,  Endo/Other  negative endocrine ROS  Renal/GU Renal disease     Musculoskeletal negative musculoskeletal ROS (+)   Abdominal   Peds  Hematology negative hematology ROS (+)   Anesthesia Other Findings   Reproductive/Obstetrics negative OB ROS                             Anesthesia Physical Anesthesia Plan  ASA: II  Anesthesia Plan: MAC   Post-op Pain Management:    Induction: Intravenous  PONV Risk Score and Plan: 0 and Ondansetron and Propofol infusion  Airway Management Planned:   Additional Equipment:   Intra-op Plan:   Post-operative Plan:   Informed Consent: I have reviewed the patients History and Physical, chart, labs and discussed the procedure including the risks, benefits and alternatives for the proposed anesthesia with the patient or authorized representative who has indicated his/her understanding and acceptance.   Dental advisory given  Plan Discussed with: CRNA  Anesthesia Plan Comments:         Anesthesia Quick Evaluation

## 2017-03-28 NOTE — Anesthesia Postprocedure Evaluation (Signed)
Anesthesia Post Note  Patient: Craig Guzman  Procedure(s) Performed: Procedure(s) (LRB): ESOPHAGOGASTRODUODENOSCOPY (EGD) WITH PROPOFOL  with balloon dilation of duodenum (N/A)     Patient location during evaluation: PACU Anesthesia Type: MAC Level of consciousness: awake and alert Pain management: pain level controlled Vital Signs Assessment: post-procedure vital signs reviewed and stable Respiratory status: spontaneous breathing Cardiovascular status: stable Anesthetic complications: no    Last Vitals:  Vitals:   03/28/17 0820 03/28/17 1300  BP: (!) 145/83 (!) 167/92  Pulse: 61 (!) 53  Resp: 18 16  Temp: 36.7 C 36.7 C  SpO2: 100% 98%    Last Pain:  Vitals:   03/28/17 1300  TempSrc: Oral  PainSc:                  Nolon Nations

## 2017-03-28 NOTE — Care Management Note (Signed)
Case Management Note  Patient Details  Name: Craig Guzman MRN: [id number] Date of Birth: 29-Nov-1970  Subjective/Objective:                   46 year old male with a history of HTN, GERD, Crohn's disease on Humira, migraine, obesity, tobacco abuse who presents to the ED today with epigastric and periumbilical abdominal pain and intermittent nausea vomiting 2-3 days. Patient is followed by. Gatha Mayer, MD. Patient recently had upper endoscopy on 9/18 that showed acquired duodenal stenosis, this was dilated. Patient has previously also had  acute pancreatitis , due to stranding around the pancreas   from the antrum or duodenal bulb. Patient denies any fever, chills, hematochezia, melena ED course BP (!) 171/107 (BP Location: Left Arm)  Pulse 64  Temp 99 F (37.2 C) (Oral)  Resp 18  Ht 6' 1"  (1.854 m)  Wt 132.9 kg (293 lb)  SpO2 100%  BMI 38.66 kg/m  Abdominal KUB shows large amount of stool in the colon, constipation White count 5.6, creatinine 1.37, UA negative Patient was given IV fluids, IV Bentyl, IV Reglan, IV Rocephin EDP discussed with Dr. Henrene Pastor in GI who felt that the patient have a duodenal stricture and he should be readmitted, and may need another endoscopy   Action/Plan: Date:  March 28, 2017 Chart reviewed for concurrent status and case management needs.  Will continue to follow patient progress.  Discharge Planning: following for needs  Expected discharge date: [id number]  Velva Harman, BSN, [place], [place]   Expected Discharge Date:   (unknown)               Expected Discharge Plan:  Home/Self Care  In-House Referral:     Discharge planning Services  CM Consult  Post Acute Care Choice:    Choice offered to:     DME Arranged:    DME Agency:     HH Arranged:    [place] Agency:     Status of Service:  In process, will continue to follow  If discussed at Long Length of Stay Meetings, dates discussed:    Additional Comments:  Leeroy Cha, RN 03/28/2017, 8:00 AM

## 2017-03-28 NOTE — Progress Notes (Signed)
Patient ID: Craig Guzman, male   DOB: 30-Mar-1971, 46 y.o.   MRN: [id number]     Progress Note   Subjective   Doing well -no significant pain on clear liquids , and no vomiting  Says at home he has been having lay down after eating and generally only eating once per day recently - he lays on left side which is more comfortable- if lays on right side he hurts.  CT as outlined below    Objective   Vital signs in last 24 hours: Temp:  [97.7 F (36.5 C)-98.2 F (36.8 C)] 98 F (36.7 C) (09/26 0820) Pulse Rate:  [50-63] 61 (09/26 0820) Resp:  [18-20] 18 (09/26 0820) BP: (136-170)/(70-110) 145/83 (09/26 0820) SpO2:  [97 %-100 %] 100 % (09/26 0820) Weight:  [290 lb (131.5 kg)] 290 lb (131.5 kg) (09/25 0938) Last BM Date: 03/27/17 General:   AA male in NAD Heart:  Regular rate and rhythm; no murmurs Lungs: Respirations even and unlabored, lungs CTA bilaterally Abdomen:  Soft,  Min tender and nondistended. Normal bowel sounds. Extremities:  Without edema. Neurologic:  Alert and oriented,  grossly normal neurologically. Psych:  Cooperative. Normal mood and affect.  Intake/Output from previous day: 09/25 0701 - 09/26 0700 In: 1166.3 [P.O.:840; I.V.:326.3] Out: 425 [Urine:425] Intake/Output this shift: No intake/output data recorded.  Lab Results:  Recent Labs  03/26/17 1839 03/27/17 0914 03/28/17 0532  WBC 5.6 4.8 7.8  HGB 16.0 14.0 14.1  HCT 44.7 40.6 40.2  PLT 212 177 181   BMET  Recent Labs  03/26/17 1839 03/27/17 0914 03/28/17 0532  NA 140  --  137  K 3.8  --  4.3  CL 107  --  109  CO2 25  --  20*  GLUCOSE 97  --  106*  BUN 12  --  11  CREATININE 1.37* 1.14 0.88  CALCIUM 8.8*  --  8.5*   LFT  Recent Labs  03/28/17 0532  PROT 6.8  ALBUMIN 3.1*  AST 29  ALT 42  ALKPHOS 116  BILITOT 0.6   PT/INR No results for input(s): LABPROT, INR in the last 72 hours.  Studies/Results: Ct Abdomen Pelvis W Contrast  Result Date: 03/27/2017 CLINICAL DATA:   Colonic and upper urinary tract Crohn disease. History of duodenal stricture with biopsies demonstrating active inflammation. EXAM: CT ABDOMEN AND PELVIS WITH CONTRAST TECHNIQUE: Multidetector CT imaging of the abdomen and pelvis was performed using the standard protocol following bolus administration of intravenous contrast. CONTRAST:  11m ISOVUE-300 IOPAMIDOL (ISOVUE-300) INJECTION 61% COMPARISON:  03/27/2017 plain films.  CT 05/28/2016. FINDINGS: Lower chest: Clear lung bases. Mild cardiomegaly, without pericardial or pleural effusion. Hepatobiliary: Normal liver. Cholecystectomy, without biliary ductal dilatation. Pancreas: Normal, without mass or ductal dilatation. Spleen: Normal in size, without focal abnormality. Adrenals/Urinary Tract: Normal adrenal glands. Normal kidneys, without hydronephrosis. Normal urinary bladder. Stomach/Bowel: The gastric antrum appears thick walled, favored to be due to underdistention. Example image 27/series 2. Normal appearance of the colon. The terminal ileum is identified on image 54/series 2 and is normal. Appendix not visualized. Despite the clinical history, no duodenal inflammation is identified. No wall thickening or surrounding edema. There is suggestion of narrowing versus underdistention involving the mid descending duodenum on coronal image 60. No evidence of gastric outlet obstruction or proximal duodenal dilatation. The remainder of the small bowel is also within normal limits. Vascular/Lymphatic: Aortic and branch vessel atherosclerosis. Multiple borderline to mildly enlarged gastrohepatic ligament nodes. The largest measures 11 mm on  image 20/series 2. This is similar to on the prior. No pelvic sidewall adenopathy. Reproductive: Mild prostatomegaly. Other: No significant free fluid. Musculoskeletal: Lumbosacral spondylosis, with degenerative disc disease at L4-5 and L5-S1. IMPRESSION: 1. No evidence of acute gastric or bowel inflammation. 2. Narrowing versus  underdistention within the mid descending duodenum. This could correspond to the area of duodenal stricture detailed on the clinical history. 3. Prominent nodes in the gastrohepatic ligament are likely reactive and similar to 05/28/2016. 4.  Aortic Atherosclerosis (ICD10-I70.0). Electronically Signed   By: Abigail Miyamoto M.D.   On: 03/27/2017 16:38   Dg Abdomen Acute W/chest  Result Date: 03/27/2017 CLINICAL DATA:  Abdominal pain. Vomiting. Recent endoscopy. History Crohn's disease. EXAM: DG ABDOMEN ACUTE W/ 1V CHEST COMPARISON:  CT 06/24/2016. CT 05/28/2016. Chest x-ray 12 scratched it abdominal series 12 09/20/2015 . FINDINGS: No acute cardiopulmonary disease. Surgical clips right upper quadrant. Soft tissues are unremarkable. Large amount of stool noted in colon. Constipation cannot be excluded. Tiny lower pelvic calcifications again noted most likely phleboliths. IMPRESSION: 1. No acute cardiopulmonary disease. 2. Large amount of stool in the colon. Constipation cannot be excluded. No acute intra-abdominal abnormality identified . 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  Pt is symptomatic with recurrent epigastric pain worse post prandially and vomiting CT yesterday does not show any active Crohn"s  Anywhere else in  Small or large bowel , and interestingly no significant inflammation at level of distal duodenum, just narrowing  Plan;  Pt is scheduled for EGD with repeat balloon dilation of duodenal stricture today .  Will need to stay on full liquid to pureed diet  With frequent small meals   Plan to check Humira AB level and drug level next Friday 10/ 5 -  He may  need surgery if not responding to dilations  Discharge Planning Contact  Tallis Soledad [place], P.A.-C               [phone]      Principal Problem:   Crohn's disease of stomach  and colon-suspected Active Problems:   GERD (gastroesophageal reflux disease)   Hypertension   Morbid obesity ([place])   Exacerbation of Crohn's disease ([place])     LOS: 1 day   Vannie Hochstetler  03/28/2017, 8:56 AM

## 2017-03-29 ENCOUNTER — Encounter (HOSPITAL_COMMUNITY): Payer: Self-pay | Admitting: Gastroenterology

## 2017-03-29 LAB — CBC
HCT: 40.3 % (ref 39.0–52.0)
Hemoglobin: 13.6 g/dL (ref 13.0–17.0)
MCH: 27.9 pg (ref 26.0–34.0)
MCHC: 33.7 g/dL (ref 30.0–36.0)
MCV: 82.6 fL (ref 78.0–100.0)
Platelets: 169 10*3/uL (ref 150–400)
RBC: 4.88 MIL/uL (ref 4.22–5.81)
RDW: 14 % (ref 11.5–15.5)
WBC: 5 10*3/uL (ref 4.0–10.5)

## 2017-03-29 LAB — BASIC METABOLIC PANEL
Anion gap: 7 (ref 5–15)
BUN: 10 mg/dL (ref 6–20)
CO2: 25 mmol/L (ref 22–32)
CREATININE: 0.97 mg/dL (ref 0.61–1.24)
Calcium: 8.6 mg/dL — ABNORMAL LOW (ref 8.9–10.3)
Chloride: 108 mmol/L (ref 101–111)
GFR calc Af Amer: >60 mL/min (ref >60)
GLUCOSE: 97 mg/dL (ref 65–99)
POTASSIUM: 4 mmol/L (ref 3.5–5.1)
Sodium: 140 mmol/L (ref 135–145)

## 2017-03-29 NOTE — Progress Notes (Signed)
Patient ID: Craig Guzman, male   DOB: 08/22/1970, 46 y.o.   MRN: [id number]    Progress Note   Subjective   Doing well on clear/full liquid so far - hasn't tried much No vomiting BX pending   Objective   Vital signs in last 24 hours: Temp:  [97.8 F (36.6 C)-98.4 F (36.9 C)] 98.1 F (36.7 C) (09/27 0500) Pulse Rate:  [48-82] 54 (09/27 0500) Resp:  [10-18] 18 (09/27 0500) BP: (129-167)/(71-92) 156/91 (09/27 0500) SpO2:  [98 %-100 %] 100 % (09/27 0500) Last BM Date: 03/28/17 General:    AA male  in NAD Heart:  Regular rate and rhythm; no murmurs Lungs: Respirations even and unlabored, lungs CTA bilaterally Abdomen:  Soft, nontender and nondistended. Normal bowel sounds. Extremities:  Without edema. Neurologic:  Alert and oriented,  grossly normal neurologically. Psych:  Cooperative. Normal mood and affect.  Intake/Output from previous day: 09/26 0701 - 09/27 0700 In: 930 [P.O.:480; I.V.:450] Out: 700 [Urine:700] Intake/Output this shift: No intake/output data recorded.  Lab Results:  Recent Labs  03/27/17 0914 03/28/17 0532 03/29/17 0713  WBC 4.8 7.8 5.0  HGB 14.0 14.1 13.6  HCT 40.6 40.2 40.3  PLT 177 181 169   BMET  Recent Labs  03/26/17 1839 03/27/17 0914 03/28/17 0532 03/29/17 0713  NA 140  --  137 140  K 3.8  --  4.3 4.0  CL 107  --  109 108  CO2 25  --  20* 25  GLUCOSE 97  --  106* 97  BUN 12  --  11 10  CREATININE 1.37* 1.14 0.88 0.97  CALCIUM 8.8*  --  8.5* 8.6*   LFT  Recent Labs  03/28/17 0532  PROT 6.8  ALBUMIN 3.1*  AST 29  ALT 42  ALKPHOS 116  BILITOT 0.6   PT/INR No results for input(s): LABPROT, INR in the last 72 hours.  Studies/Results: Ct Abdomen Pelvis W Contrast  Result Date: 03/27/2017 CLINICAL DATA:  Colonic and upper urinary tract Crohn disease. History of duodenal stricture with biopsies demonstrating active inflammation. EXAM: CT ABDOMEN AND PELVIS WITH CONTRAST TECHNIQUE: Multidetector CT imaging of the abdomen  and pelvis was performed using the standard protocol following bolus administration of intravenous contrast. CONTRAST:  176m ISOVUE-300 IOPAMIDOL (ISOVUE-300) INJECTION 61% COMPARISON:  03/27/2017 plain films.  CT 05/28/2016. FINDINGS: Lower chest: Clear lung bases. Mild cardiomegaly, without pericardial or pleural effusion. Hepatobiliary: Normal liver. Cholecystectomy, without biliary ductal dilatation. Pancreas: Normal, without mass or ductal dilatation. Spleen: Normal in size, without focal abnormality. Adrenals/Urinary Tract: Normal adrenal glands. Normal kidneys, without hydronephrosis. Normal urinary bladder. Stomach/Bowel: The gastric antrum appears thick walled, favored to be due to underdistention. Example image 27/series 2. Normal appearance of the colon. The terminal ileum is identified on image 54/series 2 and is normal. Appendix not visualized. Despite the clinical history, no duodenal inflammation is identified. No wall thickening or surrounding edema. There is suggestion of narrowing versus underdistention involving the mid descending duodenum on coronal image 60. No evidence of gastric outlet obstruction or proximal duodenal dilatation. The remainder of the small bowel is also within normal limits. Vascular/Lymphatic: Aortic and branch vessel atherosclerosis. Multiple borderline to mildly enlarged gastrohepatic ligament nodes. The largest measures 11 mm on image 20/series 2. This is similar to on the prior. No pelvic sidewall adenopathy. Reproductive: Mild prostatomegaly. Other: No significant free fluid. Musculoskeletal: Lumbosacral spondylosis, with degenerative disc disease at L4-5 and L5-S1. IMPRESSION: 1. No evidence of acute gastric or bowel inflammation.  2. Narrowing versus underdistention within the mid descending duodenum. This could correspond to the area of duodenal stricture detailed on the clinical history. 3. Prominent nodes in the gastrohepatic ligament are likely reactive and similar  to 05/28/2016. 4.  Aortic Atherosclerosis (ICD10-I70.0). Electronically Signed   By: Abigail Miyamoto M.D.   On: 03/27/2017 16:38       Assessment / Plan:    #1  46 yo AA male with Crohn's disease involving upper gut  admitted with upper abdominal  pain, nausea , vomiting- on Humira Had Duodenal stenosis dilated 9/18 , and sxs recurred with 3 days  He is s/p repeat EGD with dilation to 18 mm yesterday , bx's pending.  #2 previous granulomatous  Gastritis #3 morbid obesity #4 s/p Gb  Plan; He is going to try full liquids this afternoon - would be ok for home late today  He needs to stay on a full liquid diet at home   Will arrange for AB levels etc next week at office , and close follow up with Dr Carlean Purl  Keep same Humira schedule for now         Contact  Craig Guzman, P.A.-C               [phone]      Principal Problem:   Crohn's disease of stomach and colon-suspected Active Problems:   GERD (gastroesophageal reflux disease)   Hypertension   Morbid obesity ([place])   Exacerbation of Crohn's disease ([place])     LOS: 2 days   Craig Guzman  03/29/2017, 11:59 AM

## 2017-03-29 NOTE — Discharge Summary (Signed)
Physician Discharge Summary  Craig Guzman TDD:[id number] DOB: January 06, 1971 DOA: 03/27/2017  PCP: [name], MD  Admit date: 03/27/2017 Discharge date: 03/29/2017  Recommendations for Outpatient Follow-up:  1. Pt will need to follow up with PCP in 2-3 weeks post discharge 2. Please obtain BMP to evaluate electrolytes and kidney function 3. Please also check CBC to evaluate Hg and Hct levels 4. If persistent bradycardia, my need to hold verapamil   Discharge Diagnoses:  Principal Problem:   Crohn's disease of stomach and colon-suspected Active Problems:   GERD (gastroesophageal reflux disease)   Hypertension   Morbid obesity ([place])   Exacerbation of Crohn's disease ([place])  Discharge Condition: Stable  Diet recommendation: full liquid diet until pt reassessed   History of present illness:  46 year old male with a history of HTN, GERD, Crohn's diseaseon Humira, migraine, obesity, tobacco abusewho presented to the ED with epigastric and periumbilical abdominal pain, intermittent nausea vomiting  2-3 days.  Assessment & Plan:   Principal Problem:   Crohn's disease of stomach and colon-suspected - with duodenal stricture  - with duodenal stricture, now dilated to 18 mm with a good result  - pt tolerating liquids, continue same regimen on discharge until pt reassessed  Active Problems:   Bradycardia - pt asymptomatic - if recurrent problem, may need to hold Verapamil     GERD (gastroesophageal reflux disease) - keep on PPI per home medical regimen     Hypertension, essential - reasonable control     Morbid obesity ([facility]) - Body mass index is 38.26 kg/m.  DVT prophylaxis: SCD's Code Status: Full  Family Communication: Patient at bedside  Disposition Plan: home   Consultants:   GI  Procedures:   EGD today   Antimicrobials:   None  Procedures/Studies: Ct Abdomen Pelvis W Contrast  Result Date: 03/27/2017 CLINICAL DATA:  Colonic and upper  urinary tract Crohn disease. History of duodenal stricture with biopsies demonstrating active inflammation. EXAM: CT ABDOMEN AND PELVIS WITH CONTRAST TECHNIQUE: Multidetector CT imaging of the abdomen and pelvis was performed using the standard protocol following bolus administration of intravenous contrast. CONTRAST:  189m ISOVUE-300 IOPAMIDOL (ISOVUE-300) INJECTION 61% COMPARISON:  03/27/2017 plain films.  CT 05/28/2016. FINDINGS: Lower chest: Clear lung bases. Mild cardiomegaly, without pericardial or pleural effusion. Hepatobiliary: Normal liver. Cholecystectomy, without biliary ductal dilatation. Pancreas: Normal, without mass or ductal dilatation. Spleen: Normal in size, without focal abnormality. Adrenals/Urinary Tract: Normal adrenal glands. Normal kidneys, without hydronephrosis. Normal urinary bladder. Stomach/Bowel: The gastric antrum appears thick walled, favored to be due to underdistention. Example image 27/series 2. Normal appearance of the colon. The terminal ileum is identified on image 54/series 2 and is normal. Appendix not visualized. Despite the clinical history, no duodenal inflammation is identified. No wall thickening or surrounding edema. There is suggestion of narrowing versus underdistention involving the mid descending duodenum on coronal image 60. No evidence of gastric outlet obstruction or proximal duodenal dilatation. The remainder of the small bowel is also within normal limits. Vascular/Lymphatic: Aortic and branch vessel atherosclerosis. Multiple borderline to mildly enlarged gastrohepatic ligament nodes. The largest measures 11 mm on image 20/series 2. This is similar to on the prior. No pelvic sidewall adenopathy. Reproductive: Mild prostatomegaly. Other: No significant free fluid. Musculoskeletal: Lumbosacral spondylosis, with degenerative disc disease at L4-5 and L5-S1. IMPRESSION: 1. No evidence of acute gastric or bowel inflammation. 2. Narrowing versus underdistention  within the mid descending duodenum. This could correspond to the area of duodenal stricture detailed on the clinical  history. 3. Prominent nodes in the gastrohepatic ligament are likely reactive and similar to 05/28/2016. 4.  Aortic Atherosclerosis (ICD10-I70.0). Electronically Signed   By: Abigail Miyamoto M.D.   On: 03/27/2017 16:38   Dg Abdomen Acute W/chest  Result Date: 03/27/2017 CLINICAL DATA:  Abdominal pain. Vomiting. Recent endoscopy. History Crohn's disease. EXAM: DG ABDOMEN ACUTE W/ 1V CHEST COMPARISON:  CT 06/24/2016. CT 05/28/2016. Chest x-ray 12 scratched it abdominal series 12 09/20/2015 . FINDINGS: No acute cardiopulmonary disease. Surgical clips right upper quadrant. Soft tissues are unremarkable. Large amount of stool noted in colon. Constipation cannot be excluded. Tiny lower pelvic calcifications again noted most likely phleboliths. IMPRESSION: 1. No acute cardiopulmonary disease. 2. Large amount of stool in the colon. Constipation cannot be excluded. No acute intra-abdominal abnormality identified . Electronically Signed   By: Marcello Moores  Register   On: 03/27/2017 06:30    Discharge Exam: Vitals:   03/29/17 0500 03/29/17 1417  BP: (!) 156/91 (!) 143/72  Pulse: (!) 54 (!) 50  Resp: 18 18  Temp: 98.1 F (36.7 C) 98.1 F (36.7 C)  SpO2: 100% 100%   Vitals:   03/28/17 1628 03/28/17 2216 03/29/17 0500 03/29/17 1417  BP: (!) 152/77 (!) 152/80 (!) 156/91 (!) 143/72  Pulse: (!) 48 (!) 50 (!) 54 (!) 72  Resp: 16 16 18 18   Temp: 98.4 F (36.9 C) 97.8 F (36.6 C) 98.1 F (36.7 C) 98.1 F (36.7 C)  TempSrc: Oral Oral Oral Oral  SpO2: 100% 100% 100% 100%  Weight:      Height:        General: Pt is alert, follows commands appropriately, not in acute distress Cardiovascular: Bradycardia, S1/S2 +, no murmurs, no rubs, no gallops Respiratory: Clear to auscultation bilaterally, no wheezing, no crackles, no rhonchi Abdominal: Soft, non tender, non distended, bowel sounds +, no  guarding  Discharge Instructions  Discharge Instructions    Diet - low sodium heart healthy    Complete by:  As directed    Increase activity slowly    Complete by:  As directed      Allergies as of 03/29/2017      Reactions   Zithromax [azithromycin] Hypertension      Medication List    TAKE these medications   Adalimumab 40 MG/0.8ML Pnkt Commonly known as:  HUMIRA PEN Inject 40 mg into the skin every 14 (fourteen) days. After completion of the starter   cyclobenzaprine 10 MG tablet Commonly known as:  FLEXERIL Take 1 tablet (10 mg total) by mouth 3 (three) times daily as needed (for migraines).   doxazosin 2 MG tablet Commonly known as:  CARDURA Take 1 tablet (2 mg total) by mouth daily.   lisinopril 40 MG tablet Commonly known as:  PRINIVIL,ZESTRIL Take 1 tablet (40 mg total) by mouth daily.   omeprazole-sodium bicarbonate 40-1100 MG capsule Commonly known as:  ZEGERID Take 1 capsule by mouth daily as needed. Heart Burn   ondansetron 4 MG disintegrating tablet Commonly known as:  ZOFRAN-ODT Take 1 tablet (4 mg total) by mouth every 8 (eight) hours as needed for nausea or vomiting.   prochlorperazine 25 MG suppository Commonly known as:  COMPAZINE Place 1 suppository (25 mg total) rectally every 12 (twelve) hours as needed for nausea or vomiting.   rizatriptan 10 MG disintegrating tablet Commonly known as:  MAXALT-MLT Take 1 tablet (10 mg total) by mouth 3 (three) times daily as needed for migraine.   verapamil 180 MG CR tablet Commonly known  as:  CALAN-SR Take 2 tablets (360 mg total) by mouth daily.            Discharge Care Instructions        Start     Ordered   03/29/17 0000  Increase activity slowly     03/29/17 1446   03/29/17 0000  Diet - low sodium heart healthy     03/29/17 1446     Follow-up Information    [name], MD Follow up.   Specialties:  Family Medicine, Sports Medicine Contact information: [address] [place] [address] [phone]            The results of significant diagnostics from this hospitalization (including imaging, microbiology, ancillary and laboratory) are listed below for reference.     Microbiology: Recent Results (from the past 240 hour(s))  Urine culture     Status: None   Collection Time: 03/27/17  6:00 AM  Result Value Ref Range Status   Specimen Description URINE, CLEAN CATCH  Final   Special Requests Normal  Final   Culture   Final    NO GROWTH Performed at [facility], [address], [place], [place] [address]    Report Status 03/28/2017 FINAL  Final     Labs: Basic Metabolic Panel:  Recent Labs Lab 03/26/17 1839 03/27/17 0914 03/28/17 0532 03/29/17 0713  NA 140  --  137 140  K 3.8  --  4.3 4.0  CL 107  --  109 108  CO2 25  --  20* 25  GLUCOSE 97  --  106* 97  BUN 12  --  11 10  CREATININE 1.37* 1.14 0.88 0.97  CALCIUM 8.8*  --  8.5* 8.6*  MG  --  2.1  --   --    Liver Function Tests:  Recent Labs Lab 03/26/17 1839 03/28/17 0532  AST 48* 29  ALT 50 42  ALKPHOS 151* 116  BILITOT 0.6 0.6  PROT 7.8 6.8  ALBUMIN 3.8 3.1*    Recent Labs Lab 03/26/17 1839  LIPASE 26   CBC:  Recent Labs Lab 03/26/17 1839 03/27/17 0914 03/28/17 0532 03/29/17 0713  WBC 5.6 4.8 7.8 5.0  HGB 16.0 14.0 14.1 13.6  HCT 44.7 40.6 40.2 40.3  MCV 81.0 83.0 80.7 82.6  PLT 212 177 181 169   Cardiac Enzymes:  Recent Labs Lab 03/27/17 0622  CKTOTAL 191    SIGNED: Time coordinating discharge: Over 30 minutes  Faye Ramsay, MD  [facility] 03/29/2017, 2:47 PM Pager [phone]  If 7PM-7AM, please contact night-coverage [email or web address] Password [email or web address]

## 2017-03-29 NOTE — Discharge Instructions (Signed)
Must stay on Full liquid diet for no until reassessed by gastroenterologist   Full Liquid Diet A full liquid diet may be used:  To help you transition from a clear liquid diet to a soft diet.  When your body is healing and can only tolerate foods that are easy to digest.  Before or after certain a procedure, test, or surgery (such as stomach or intestinal surgeries).  If you have trouble swallowing or chewing.  A full liquid diet includes fluids and foods that are liquid or will become liquid at room temperature. The full liquid diet gives you the proteins, fluids, salts, and minerals that you need for energy. If you continue this diet for more than 72 hours, talk to your health care provider about how many calories you need to consume. If you continue the diet for more than 5 days, talk to your health care provider about taking a multivitamin or a nutritional supplement. What do I need to know about a full liquid diet?  You may have any liquid.  You may have any food that becomes a liquid at room temperature. The food is considered a liquid if it can be poured off a spoon at room temperature.  Drink one serving of citrus or vitamin C-enriched fruit juice daily. What foods can I eat? Grains Any grain food that can be pureed in soup (such as crackers, pasta, and rice). Hot cereal (such as farina or oatmeal) that has been blended. Talk to your health care provider or dietitian about these foods. Vegetables Pulp-free tomato or vegetable juice. Vegetables pureed in soup. Fruits Fruit juice, including nectars and juices with pulp. Meats and Other Protein Sources Eggs in custard, eggnog mix, and eggs used in ice cream or pudding. Strained meats, like in baby food, may be allowed. Consult your health care provider. Dairy Milk and milk-based beverages, including milk shakes and instant breakfast mixes. Smooth yogurt. Pureed cottage cheese. Avoid these foods if they are not well  tolerated. Beverages All beverages, including liquid nutritional supplements. Ask your health care provider if you can have carbonated beverages. They may not be well tolerated. Condiments Iodized salt, pepper, spices, and flavorings. Cocoa powder. Vinegar, ketchup, yellow mustard, smooth sauces (such as hollandaise, cheese sauce, or white sauce), and soy sauce. Sweets and Desserts Custard, smooth pudding. Flavored gelatin. Tapioca, junket. Plain ice cream, sherbet, fruit ices. Frozen ice pops, frozen fudge pops, pudding pops, and other frozen bars with cream. Syrups, including chocolate syrup. Sugar, honey, jelly. Fats and Oils Margarine, butter, cream, sour cream, and oils. Other Broth and cream soups. Strained, broth-based soups. The items listed above may not be a complete list of recommended foods or beverages. Contact your dietitian for more options. What foods can I not eat? Grains All breads. Grains are not allowed unless they are pureed into soup. Vegetables Vegetables are not allowed unless they are juiced, or cooked and pureed into soup. Fruits Fruits are not allowed unless they are juiced. Meats and Other Protein Sources Any meat or fish. Cooked or raw eggs. Nut butters. Dairy Cheese. Condiments Stone ground mustards. Fats and Oils Fats that are coarse or chunky. Sweets and Desserts Ice cream or other frozen desserts that have any solids in them or on top, such as nuts, chocolate chips, and pieces of cookies. Cakes. Cookies. Candy. Others Soups with chunks or pieces in them. The items listed above may not be a complete list of foods and beverages to avoid. Contact your dietitian for more  information. This information is not intended to replace advice given to you by your health care provider. Make sure you discuss any questions you have with your health care provider. Document Released: 06/19/2005 Document Revised: 11/25/2015 Document Reviewed: 04/24/2013 Elsevier  Interactive Patient Education  2017 [employer].

## 2017-04-01 ENCOUNTER — Encounter: Payer: Self-pay | Admitting: Internal Medicine

## 2017-04-01 NOTE — Progress Notes (Signed)
My Chart letter re: bxs No recall

## 2017-04-02 ENCOUNTER — Encounter: Payer: Managed Care, Other (non HMO) | Attending: Family Medicine | Admitting: Registered"

## 2017-04-02 ENCOUNTER — Encounter: Payer: Self-pay | Admitting: Registered"

## 2017-04-02 ENCOUNTER — Telehealth: Payer: Self-pay | Admitting: Internal Medicine

## 2017-04-02 DIAGNOSIS — K50012 Crohn's disease of small intestine with intestinal obstruction: Secondary | ICD-10-CM

## 2017-04-02 DIAGNOSIS — Z6841 Body Mass Index (BMI) 40.0 and over, adult: Secondary | ICD-10-CM | POA: Insufficient documentation

## 2017-04-02 DIAGNOSIS — E669 Obesity, unspecified: Secondary | ICD-10-CM | POA: Insufficient documentation

## 2017-04-02 DIAGNOSIS — Z713 Dietary counseling and surveillance: Secondary | ICD-10-CM | POA: Diagnosis not present

## 2017-04-02 DIAGNOSIS — K50018 Crohn's disease of small intestine with other complication: Secondary | ICD-10-CM

## 2017-04-02 NOTE — Telephone Encounter (Signed)
Patient recently hospitalized and is due for next Humira on Friday.  Dr. Havery Moros recommended that he have Humira trough done.  Do you want me to order this?  Do you want other labs.  He is still not feeling well, weak according to his wife.  He did not feel up to returning to work today.  Please advise

## 2017-04-02 NOTE — Telephone Encounter (Signed)
I spoke to the patient and his wife. He is not vomiting. He had some lower abdominal pain and has not moved his bowels in 2 days. He remains on a full liquid diet. He just felt kind of off today without fever, a bit fatigued so he stayed home from work. I have ordered the adalimumab antibody and level and told him to come either Wednesday or Thursday this week to have the level drawn. I will follow-up after that. They know to call back if he is not improving. I don't think he needs to be seen necessarily for what going on now and I will arrange follow-up after I see the blood work. I told him I would fill out any paperwork he needed for work as well I think they're going to bring some as he seems to have used up is sick time and education time at this point.

## 2017-04-02 NOTE — Patient Instructions (Addendum)
Kefir is a drinkable yogurt that would be good to try ProNourish is a nutritional supplement drink like Ensure, but may be easier on your stomach Consider trying tofu at a hot bar or restaurant, [employer] on [address] might be an option that would work for a variety. Smoothie with yogurt or silk tofu, berries and bananas. Whey protein powder can increase your protein intake.

## 2017-04-02 NOTE — Progress Notes (Signed)
Medical Nutrition Therapy:  Appt start time: 3143 end time:  1700.  This patient is accompanied in the office by his spouse.  Assessment:  Primary concerns today: Pt states he has Crohn's disease and has a hard time keeping food down. 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 Pt reports that his doctor is also considering recommending a stent to keep duodenum open. Pt states is on a liquid diet but as soon as his diet is advanced he wants ideas of how he can eat to stabilize his diet.   Pt states meat doesn't digest very well, too "heavy" and he was drinking herbalife, but it made him too full. Pt states Ensure caused loose stools, more so than he already experiences.  Pt states his weight has been 290-330 lbs for 4 yrs. Pt reports his weight goes down when he gets sick and will regain weight when he recovers.   Sleep: Pt states he gets enough, but wife states that he sleeps/rests a lot on days off to recover.  Preferred Learning Style:   No preference indicated   Learning Readiness:   Ready  MEDICATIONS: reviewed   DIETARY INTAKE:  Usual eating pattern includes 1 meals and 3 snacks per day.  24-hr recall:  B ( AM): PB crackers (2)  Snk ( AM):   L ( PM): PB crackers (2) Snk ( PM): PB cracker (2) D ( PM): taco bell OR subway (tuna, [place], or roasted chicken) Snk ( PM): crackers Beverages: water ~24 oz, soda, juice, gatorade  Usual physical activity: Physical activity: 2.5 mile walking at work, including a lot of stair climbing 5-6 days per week.  Estimated energy needs: 2200 calories 248 g carbohydrates 138 g protein 73 g fat   Nutritional Diagnosis:  NI-1.4 Inadequate energy intake As related to 1 small meal per day.  As evidenced by dietary recall..    Intervention:  Nutrition Education. Discussed sources of proteins which may be more easily digested. Discussed  purpose of different types of nutritional supplements. Discussed the need for adequate nourishment before aiming for more physical activity.  Plan: Kefir is a drinkable yogurt that would be good to try ProNourish is a nutritional supplement drink like Ensure, but may be easier on your stomach Consider trying tofu at a hot bar or restaurant, [employer] [address] might be an option that would work for a variety. Smoothie with yogurt or silk tofu, berries and bananas. Whey protein powder can increase your protein intake.  Teaching Method Utilized:  Visual Auditory  Handouts given during visit include:  Smoothie recipes   Barriers to learning/adherence to lifestyle change: none  Demonstrated degree of understanding via:  Teach Back   Monitoring/Evaluation:  Dietary intake, exercise, and body weight prn.

## 2017-04-05 ENCOUNTER — Other Ambulatory Visit: Payer: Managed Care, Other (non HMO)

## 2017-04-05 DIAGNOSIS — K50012 Crohn's disease of small intestine with intestinal obstruction: Secondary | ICD-10-CM

## 2017-04-05 NOTE — Addendum Note (Signed)
Addended by: Isaiah Serge D on: 04/05/2017 03:11 PM   Modules accepted: Orders

## 2017-04-10 LAB — ADALIMUMAB+AB (SERIAL MONITOR)
ANTI-ADALIMUMAB ANTIBODY: 275 ng/mL
Adalimumab Drug Level: 1.1 ug/mL

## 2017-04-10 LAB — SERIAL MONITORING

## 2017-04-11 ENCOUNTER — Other Ambulatory Visit: Payer: Self-pay | Admitting: Family Medicine

## 2017-04-11 DIAGNOSIS — I10 Essential (primary) hypertension: Secondary | ICD-10-CM

## 2017-04-12 ENCOUNTER — Encounter: Payer: Self-pay | Admitting: Internal Medicine

## 2017-04-12 NOTE — Progress Notes (Signed)
My Chart note re low levels drug and + Abs

## 2017-04-14 ENCOUNTER — Other Ambulatory Visit: Payer: Self-pay | Admitting: Family Medicine

## 2017-04-14 DIAGNOSIS — K219 Gastro-esophageal reflux disease without esophagitis: Secondary | ICD-10-CM

## 2017-04-17 ENCOUNTER — Encounter: Payer: Self-pay | Admitting: Internal Medicine

## 2017-04-17 ENCOUNTER — Other Ambulatory Visit: Payer: Self-pay | Admitting: *Deleted

## 2017-04-17 NOTE — Telephone Encounter (Signed)
Please advise 

## 2017-04-17 NOTE — Patient Outreach (Signed)
[facility]) Care Management  04/17/2017  Craig Guzman 11/15/1970 [id number]   Subjective:Telephone call to patient's home number,spoke with patient's wife Craig Guzman), states Craig Guzman is not available, left HIPAA compliant message for patient, and requested call back.     Objective: Per KPN (Knowledge Performance Now, point of care tool), Cigna iCollaborate, and chart review, patient hospitalized 03/27/17 - 03/29/17 for Crohn's disease of stomach and colon-suspected.    Patient also has a history of migraine and hypertension.      Assessment: Received C[employer] Transition of care referral on 04/03/17.   Transition of care follow up pending patient contact.      Plan: RNCM will call patient for 2nd telephone outreach attempt, transition of care follow up, within 10 business days if no return call.     Craig Guzman H. Annia Friendly, BSN, [place] Management [facility] Telephonic CM Phone: [phone] Fax: [phone]

## 2017-04-19 ENCOUNTER — Other Ambulatory Visit: Payer: Self-pay | Admitting: *Deleted

## 2017-04-19 NOTE — Patient Outreach (Signed)
[facility]) Care Management  04/19/2017  Craig Guzman [birth year] [id number]   Subjective:Telephone call to patient's home number,spoke with patient's wife Craig Guzman), states Mr. Craig Guzman is not available, currently at work, left HIPAA compliant message for Mr. Craig Guzman, and requested call back.  Advised wife unable to discuss nature of call without Mr. Craig Guzman's verbal authorization, she voices understanding, and states Mr. Craig Guzman may also be reached on mobile number [phone]).  States she has also given Mr. Craig Guzman my previous message and he does not get off from work this week until 5:00pm.       Objective: Per KPN (Knowledge Performance Now, point of care tool), Cigna iCollaborate, and chart review, patient hospitalized 03/27/17 - 03/29/17 for Crohn's disease of stomach and colon-suspected.    Patient also has a history of migraine and hypertension.      Assessment: Received Cigna Transition of care referral on 04/03/17.   Transition of care follow up pending patient contact.      Plan: RNCM will call patient for 3rd telephone outreach attempt, transition of care follow up, within 10 business days if no return call.     Craig Guzman H. Annia Friendly, BSN, [place] Management [facility] Telephonic CM Phone: [phone] Fax: [phone]

## 2017-04-20 ENCOUNTER — Encounter: Payer: Self-pay | Admitting: *Deleted

## 2017-04-20 ENCOUNTER — Other Ambulatory Visit: Payer: Self-pay | Admitting: *Deleted

## 2017-04-20 NOTE — Patient Outreach (Addendum)
[facility]) Care Management  04/20/2017  Hiroto Saltzman [birth year] [id number]   Subjective: Received voicemail message from Mr. Passero, states he is returning call, and requested call back on mobile number.   Telephone call to patient's  mobile number, spoke with patient, and HIPAA verified.  Discussed [facility] Care Management [facility] Transition of care follow up, patient voiced understanding, and is in agreement to follow up.  Patient gave verbal authorization to speak with wife Taliesin Hartlage) regarding his healthcare needs as needed.  States he doing well recovering from hospitalization, is currently in class, has family medical leave act Ecologist ) paperwork in process, does not have any questions for this RNCM, and request that I speak with wife to complete transition of care follow up.    Objective: Per KPN (Knowledge Performance Now, point of care tool), Cigna iCollaborate, and chart review, patient hospitalized 03/27/17 - 03/29/17 for Crohn's disease of stomach and colon-suspected. Patient also has a history of migraine and hypertension.     Assessment: Received Cigna Transition of care referral on 04/03/17. Transition of care follow up pending patient contact.      Plan:RNCM will call patient's wife for 4th telephone outreach attempt, per patient's request, transition of care follow up, within 10 business days if no return call. RNCM will send unsuccessful outreach  letter, [facility] pamphlet, and proceed with case closure, within 10 business days if no return call.    Alexandar Weisenberger H. Annia Friendly, BSN, [place] Management [facility] Telephonic CM Phone: [phone] Fax: [phone]

## 2017-04-23 ENCOUNTER — Other Ambulatory Visit: Payer: Self-pay | Admitting: *Deleted

## 2017-04-23 ENCOUNTER — Encounter: Payer: Self-pay | Admitting: *Deleted

## 2017-04-23 NOTE — Patient Outreach (Addendum)
[facility]) Care Management  04/23/2017  Craig Guzman Dec 16, 1970 [id number]  Subjective: Telephone call to patient's home number, spoke with patient's wife Craig Guzman), per patient's verbal authorization.  Wife stated patient's name, date of birth, and address.   Discussed [facility] Care Management Cigna Transition of care follow up, wife voiced understanding, and is in agreement to follow up on patient's behalf. Wife states patient is currently at work, increasing activity, and diet as tolerated.    States she will call patient's primary MD to schedule a follow up appointment and patient has been speaking with gastroenterologist via phone, and a follow up appointment is pending.  Wife states she administers patient's biweekly Humira injections, will follow up with MD's office regarding the site irritation, and see if there are any other suggestions since patient can only tolerate injections in the legs.  Wife states pattient voices understanding of medical diagnosis and treatment plan.  States he is accessing his Christella Scheuermann benefits as needed via member services number on back of card or through [email or web address]   States he is also applying for family medical leave act Ecologist) and will obtain a copy of all paperwork for his records.  Wife states patient does not have any education material, transition of care, care coordination, disease management, disease monitoring, transportation, community resource, or pharmacy needs at this time.  States she is very appreciative of the follow up and is in agreement to receive [place] Management information.    Objective: Per KPN (Knowledge Performance Now, point of care tool), Cigna iCollaborate, and chart review, patient hospitalized 03/27/17 - 03/29/17 for Crohn's disease of stomach and colon-suspected. Patient also has a history of migraine and hypertension.     Assessment: Received Cigna Transition of care referral on 04/03/17. Transition of care  follow up completed, no care management needs, and will proceed with case closure.      Plan:RNCM will send patient successful outreach letter, [facility] pamphlet, and magnet. RNCM will send case closure due to follow up completed / no care management needs request to [facility].     Iola Turri H. Annia Friendly, BSN, [place] Management [facility] Telephonic CM Phone: [phone] Fax: [phone]

## 2017-04-25 NOTE — Telephone Encounter (Signed)
Refilled Zegerid as on d/c summary meds from September.

## 2017-04-30 ENCOUNTER — Telehealth: Payer: Self-pay

## 2017-04-30 NOTE — Telephone Encounter (Signed)
Dr. Carlota Raspberry, I just received the fax.  It is not a form to complete, but a request for the provider to write a letter of medical necessity stating why this medication is necessary for the patient.  From my past experience, it does not have to be long, just a paragraph or so as to why you feel this medication is needed instead of a formulary option.  After completion it can be returned to me, or your assistant can fax to [place] Department.  [phone]. Please document that this is an expedited appeal which takes 72 hrs.  If that is not mentioned in your letter, the appeal could take up to 30 days. Your ICD-10 codes are K29.70 Gastritis, K50.90 Crohn's, and K29.70 Jerrye Bushy, and should be included as well. If I can assist you any further, please don't hesitate to ask. Thanks, [employer]

## 2017-04-30 NOTE — Telephone Encounter (Signed)
Call to insurance company to update claim with the DX of Crohn's and Gastritis.  The rep did not seem interested in the additional DX.  She said patient MUST have tried and failed all three formulary drugs or it would not be approved.  Put in a call to patient to inquire about any tried and fails that we may not be aware of.  Gave him the examples of nexium and prilosec.  As soon as I hear back from the patient I will start an appeal with tried and failed medications and new DX codes.  I am awaiting the fax now.

## 2017-04-30 NOTE — Telephone Encounter (Signed)
He has a history of Crohn's as well as gastritis on most recent endoscopy. Will they cover that medication with gastritis diagnosis? He is also followed by [name] with gastroenterology. Not sure if his office can also assist with getting this medication covered.

## 2017-04-30 NOTE — Telephone Encounter (Signed)
Dr. Carlota Raspberry, A request for Omeprazole was started on 04/27/2017 through Cover My Meds.  Received a denial today, stating that all proton pump inhibitors need trial and fail of three other medications before being approved.  Patient has tried and failed prevacid in the past.  Now needs to try Nexium or Prilosec. Please advise, Craig Guzman

## 2017-05-01 NOTE — Telephone Encounter (Signed)
History of Crohn's disease, GERD with ongoing gastritis.  Patchy inflammation characterized by erosions, friability and granularity was found in the gastric antrum on endoscopy 03/28/2017. History of vomiting and early morning awakening with abdominal pain, requiring treatment with compazine and Zofran. Dx codes K29.70 - gastritis, K50.90-crohn's. Will ask his gastroenterologist for other input.

## 2017-05-01 NOTE — Telephone Encounter (Signed)
Note received from his gastroenterologist. He noted that he has also failed lansoprazole, dexilant, pantoprazole and regular omeprazole over the years, and that Zegerid was prescribed due to his work schedule. Thanks.

## 2017-05-01 NOTE — Telephone Encounter (Signed)
Submitted claim to [employer].  Awaiting response.

## 2017-05-01 NOTE — Progress Notes (Signed)
I spoke to Craig Guzman and we have decided to do the following  1) 6 MP 50 mg daily to try to reduce Abs to adalimumab # 90 1 refill (has been on this before and done ok) 2) see if we can get him increased to Humira 40 mg weekly - low trough level 3) RTC January 4) Have him do a CBC and LFT in 2 weeks re long-term use 6 MP

## 2017-05-02 ENCOUNTER — Other Ambulatory Visit: Payer: Self-pay

## 2017-05-02 ENCOUNTER — Telehealth: Payer: Self-pay | Admitting: Family Medicine

## 2017-05-02 DIAGNOSIS — Z79899 Other long term (current) drug therapy: Secondary | ICD-10-CM

## 2017-05-02 MED ORDER — ADALIMUMAB 40 MG/0.8ML ~~LOC~~ AJKT: 40.0000 mg | AUTO-INJECTOR | SUBCUTANEOUS | 6 refills | Status: DC

## 2017-05-02 MED ORDER — MERCAPTOPURINE 50 MG PO TABS: 50.0000 mg | ORAL_TABLET | Freq: Every day | ORAL | 1 refills | Status: DC

## 2017-05-02 NOTE — Addendum Note (Signed)
Addended by: Marlon Pel on: 05/02/2017 10:01 AM   Modules accepted: Orders

## 2017-05-02 NOTE — Telephone Encounter (Signed)
Dr Carlota Raspberry  Dr Charlesetta Shanks would like for you to give him a call to discuss pt medicine  He can be reached at [phone]

## 2017-05-03 NOTE — Telephone Encounter (Signed)
Please see note below. 

## 2017-05-04 NOTE — Telephone Encounter (Signed)
I called the provided number, left message for Dr. Charlesetta Shanks  to call me back.

## 2017-05-07 NOTE — Telephone Encounter (Signed)
Submitted second request from doctor Carlota Raspberry.  It was from a telephone call he had with [name], Gastroenterologist.  Awaiting response.

## 2017-05-07 NOTE — Telephone Encounter (Signed)
Awaiting response from ins co.  If rejected again will submit the second note from Dr. Nyoka Cowden regarding the gastroenterologist's opinion.

## 2017-05-08 ENCOUNTER — Other Ambulatory Visit: Payer: Self-pay | Admitting: Adult Health

## 2017-05-08 NOTE — Telephone Encounter (Signed)
[name], Received fax today from patient's insurance stating that he has exhausted all of his opportunities for an appeal.  They have completed the preliminary review and forwarded to the Independent Review Organization who will determine if case qualifies for external review.  If accepted the IRO will contact patient with next step.  The decision could take as long as 45 days to be approved or denied.  Patient will receive a letter explaining all this to him. I do apologize for not being able to get the approval at this time, and I thank you for your assistance. [address]

## 2017-05-08 NOTE — Telephone Encounter (Signed)
Previous message should be addressed to Dr. Carlota Raspberry and signed Evorn Gong.

## 2017-05-09 NOTE — Telephone Encounter (Addendum)
Noted. Thank you for your work. Can we please call the patient and make sure he is taking some form of proton pump inhibitor? Specific one can be discussed with his gastroenterologist, but some PPI is better than nothing at this point. Thanks. -JG

## 2017-05-10 ENCOUNTER — Telehealth: Payer: Self-pay

## 2017-05-10 NOTE — Telephone Encounter (Signed)
Called patient and LMVM advising him of Dr. Vonna Kotyk message with the number to my desk if he has any questions.

## 2017-07-09 ENCOUNTER — Ambulatory Visit (INDEPENDENT_AMBULATORY_CARE_PROVIDER_SITE_OTHER): Payer: Managed Care, Other (non HMO) | Admitting: Internal Medicine

## 2017-07-09 ENCOUNTER — Other Ambulatory Visit (INDEPENDENT_AMBULATORY_CARE_PROVIDER_SITE_OTHER): Payer: Managed Care, Other (non HMO)

## 2017-07-09 ENCOUNTER — Encounter: Payer: Self-pay | Admitting: Internal Medicine

## 2017-07-09 VITALS — BP 160/100 | HR 68 | Ht 71.0 in | Wt 294.0 lb

## 2017-07-09 DIAGNOSIS — K50912 Crohn's disease, unspecified, with intestinal obstruction: Secondary | ICD-10-CM

## 2017-07-09 DIAGNOSIS — K315 Obstruction of duodenum: Secondary | ICD-10-CM

## 2017-07-09 DIAGNOSIS — Z7960 Long term (current) use of unspecified immunomodulators and immunosuppressants: Secondary | ICD-10-CM

## 2017-07-09 DIAGNOSIS — Z79899 Other long term (current) drug therapy: Secondary | ICD-10-CM

## 2017-07-09 LAB — CBC WITH DIFFERENTIAL/PLATELET
Basophils Absolute: 0.1 10*3/uL (ref 0.0–0.1)
Basophils Relative: 1 % (ref 0.0–3.0)
EOS PCT: 3.1 % (ref 0.0–5.0)
Eosinophils Absolute: 0.3 10*3/uL (ref 0.0–0.7)
HCT: 49.3 % (ref 39.0–52.0)
HEMOGLOBIN: 16.3 g/dL (ref 13.0–17.0)
Lymphocytes Relative: 42.4 % (ref 12.0–46.0)
Lymphs Abs: 3.7 10*3/uL (ref 0.7–4.0)
MCHC: 33 g/dL (ref 30.0–36.0)
MCV: 86.5 fl (ref 78.0–100.0)
MONOS PCT: 9.8 % (ref 3.0–12.0)
Monocytes Absolute: 0.8 10*3/uL (ref 0.1–1.0)
Neutro Abs: 3.8 10*3/uL (ref 1.4–7.7)
Neutrophils Relative %: 43.7 % (ref 43.0–77.0)
Platelets: 194 10*3/uL (ref 150.0–400.0)
RBC: 5.71 Mil/uL (ref 4.22–5.81)
RDW: 14.8 % (ref 11.5–15.5)
WBC: 8.7 10*3/uL (ref 4.0–10.5)

## 2017-07-09 NOTE — Assessment & Plan Note (Signed)
Recheck abs

## 2017-07-09 NOTE — Progress Notes (Signed)
Craig Guzman 47 y.o. [birth year] [id number]  Assessment & Plan:   Encounter Diagnoses  Name Primary?  . Duodenal stricture Yes  . Crohn's disease with intestinal obstruction, unspecified gastrointestinal tract location ([place])   . Long-term use of immunosuppressant medication     It sounds like his duodenal stricture may be back.  He said he had a good response for a while.  Working diagnosis is this is from Crohn's disease.  He will continue his current medications and I am going to check adalimumab antibodies at trough and also CBC and LFTs given his mercaptopurine use.  Further plans pending these labs and repeat EGD with dilation is also scheduled.  He needs to have his blood pressure rechecked and have follow-up with primary care.  He had some headache today but no other worrisome findings or symptoms, this is not really a new problem.   Subjective:   Chief Complaint: Follow-up of Crohn's disease duodenal stricture  HPI Craig Guzman is here with his wife Craig Guzman and their daughter, he is been having some more problems with nausea and regurgitation, he notices that he does better if he lies down after eating as opposed to sitting up.  He seems to think that his stricture may be closed again.  He said after he has dilated twice in September, especially after Dr. Havery Moros dilated it more he was significantly improved for a while.  He has been on 6-MP and Humira weekly, the 6-MP was added to try to reduce antibodies from Humira.  He is noting that he has lost some weight.  The Humira injections burned quite a bit though not 100% of the time they do this and he is wondering about sites to inject other than the abdomen and thighs.  His wife has discussed that with our nurse. Allergies  Allergen Reactions  . Zithromax [Azithromycin] Hypertension   Current Meds  Medication Sig  . Adalimumab (HUMIRA PEN) 40 MG/0.8ML PNKT Inject 40 mg into the skin once a week. After completion of the starter  .  cyclobenzaprine (FLEXERIL) 10 MG tablet Take 1 tablet (10 mg total) by mouth 3 (three) times daily as needed (for migraines).  Marland Kitchen doxazosin (CARDURA) 2 MG tablet Take 1 tablet (2 mg total) by mouth daily.  Marland Kitchen lisinopril (PRINIVIL,ZESTRIL) 40 MG tablet Take 1 tablet (40 mg total) by mouth daily.  . mercaptopurine (PURINETHOL) 50 MG tablet Take 1 tablet (50 mg total) by mouth daily. Give on an empty stomach 1 hour before or 2 hours after meals.  Marland Kitchen omeprazole-sodium bicarbonate (ZEGERID) 40-1100 MG capsule TAKE 1 CAPSULE BY MOUTH AT BEDTIME.  Marland Kitchen ondansetron (ZOFRAN-ODT) 4 MG disintegrating tablet Take 1 tablet (4 mg total) by mouth every 8 (eight) hours as needed for nausea or vomiting.  . prochlorperazine (COMPAZINE) 25 MG suppository Place 1 suppository (25 mg total) rectally every 12 (twelve) hours as needed for nausea or vomiting.  . rizatriptan (MAXALT-MLT) 10 MG disintegrating tablet TAKE 1 TABLET (10 MG TOTAL) BY MOUTH 3 (THREE) TIMES DAILY AS NEEDED FOR MIGRAINE.  Marland Kitchen verapamil (CALAN-SR) 180 MG CR tablet TAKE 2 TABLETS (360 MG TOTAL) BY MOUTH DAILY.   Past Medical History:  Diagnosis Date  . Allergy    trees/ grasses/ animals/dust/mold  . Anxiety   . Biliary dyskinesia   . Colloid thyroid nodule   . Crohn's disease ([place])    Stomach, terminal ileum, cecum  . Gastric ulcer    antral  . GERD (gastroesophageal reflux disease)   .  History of migraine headaches   . HTN (hypertension)   . Kidney stone 09/20/2012  . Migraine   . Obesity   . Pneumonia 02/27/2014   ED   Past Surgical History:  Procedure Laterality Date  . CHOLECYSTECTOMY  2011   [place]  . COLONOSCOPY  07/26/11   Crohn's colitis, ileitis  . ESOPHAGOGASTRODUODENOSCOPY  05/04/11, 07/26/11   granulomatous gastritis - Crohn's  . ESOPHAGOGASTRODUODENOSCOPY (EGD) WITH PROPOFOL N/A 03/28/2017   Procedure: ESOPHAGOGASTRODUODENOSCOPY (EGD) WITH PROPOFOL  with balloon dilation of duodenum;  Surgeon: [name], MD;   Location: [facility];  Service: Gastroenterology;  Laterality: N/A;  . FLEXIBLE SIGMOIDOSCOPY    . JOINT REPLACEMENT    . MULTIPLE TOOTH EXTRACTIONS  2005  . SHOULDER SURGERY     right  . VASECTOMY     bilateral w/lysis of penile adhesions   Social History   Social History Narrative   Married. Education: [employer].    Lives at home w/ his wife and kids   Right-handed   Caffeine: 3 sodas per day   family history includes Asthma in his mother; Colon polyps in his father; Diabetes in his father; Heart disease in his maternal grandmother; Hypertension in his father and mother.   Review of Systems Wt Readings from Last 3 Encounters:  07/09/17 294 lb (133.4 kg)  03/27/17 290 lb (131.5 kg)  03/20/17 296 lb (134.3 kg)   Has a mild headache similar to prior headaches  Objective:   Physical Exam @BP  (!) 160/100 (BP Location: Left Arm)   Pulse 68   Ht 5' 11"  (1.803 m) Comment: height measured without shoes  Wt 294 lb (133.4 kg)   BMI 41.00 kg/m @  General:  NAD Eyes:   anicteric Lungs:  clear Heart::  S1S2 no rubs, murmurs or gallops Abdomen:  soft and nontender, BS+ Ext:   no edema, cyanosis or clubbing    Data Reviewed:   Prior labs, EGD's CT scan from late 2018

## 2017-07-09 NOTE — Patient Instructions (Signed)
You have been scheduled for an endoscopy. Please follow written instructions given to you at your visit today. If you use inhalers (even only as needed), please bring them with you on the day of your procedure.   Your physician has requested that you go to the basement for the lab work before leaving today.   Follow up with primary care in regards to your blood pressure.   I appreciate the opportunity to care for you. Silvano Rusk, MD, [facility]

## 2017-07-10 ENCOUNTER — Encounter: Payer: Self-pay | Admitting: Internal Medicine

## 2017-07-10 LAB — HEPATIC FUNCTION PANEL
ALBUMIN: 4.4 g/dL (ref 3.5–5.2)
ALT: 21 U/L (ref 0–53)
AST: 21 U/L (ref 0–37)
Alkaline Phosphatase: 103 U/L (ref 39–117)
Bilirubin, Direct: 0.1 mg/dL (ref 0.0–0.3)
Total Bilirubin: 0.5 mg/dL (ref 0.2–1.2)
Total Protein: 8.1 g/dL (ref 6.0–8.3)

## 2017-07-14 LAB — ADALIMUMAB+AB (SERIAL MONITOR)
Adalimumab Drug Level: 2.4 ug/mL
Anti-Adalimumab Antibody: 284 ng/mL

## 2017-07-14 LAB — SERIAL MONITORING

## 2017-07-16 ENCOUNTER — Other Ambulatory Visit: Payer: Self-pay

## 2017-07-16 ENCOUNTER — Ambulatory Visit (AMBULATORY_SURGERY_CENTER): Payer: Managed Care, Other (non HMO) | Admitting: Internal Medicine

## 2017-07-16 ENCOUNTER — Encounter: Payer: Self-pay | Admitting: Internal Medicine

## 2017-07-16 VITALS — BP 149/86 | HR 57 | Temp 99.3°F | Resp 15 | Ht 71.0 in | Wt 294.0 lb

## 2017-07-16 DIAGNOSIS — K315 Obstruction of duodenum: Secondary | ICD-10-CM

## 2017-07-16 MED ORDER — SODIUM CHLORIDE 0.9 % IV SOLN: 500.0000 mL | Freq: Once | INTRAVENOUS | Status: DC

## 2017-07-16 NOTE — Progress Notes (Signed)
Pt's states no medical or surgical changes since previsit or office visit.Patient consents to observer being present for procedure. No allergy to soy or eggs    Pt states he had diarrhea last Friday for about 12 hrs.  No fever or other symptoms, drank Gatorade and feels better today.

## 2017-07-16 NOTE — Progress Notes (Signed)
To PACU, VSS. Report to [email or web address] 

## 2017-07-16 NOTE — Patient Instructions (Addendum)
I dilated the stricture again - it was definitely better.  Please try to follow a step 3 gastroparesis diet.  The antibodies to the Humira are still present.  I need to think about changing the treatment and will contact you.  I appreciate the opportunity to care for you. Gatha Mayer, MD, [facility]  Discharge instructions given. Handouts on Gastritis and a dilatation diet. Resume previous medication. YOU HAD AN ENDOSCOPIC PROCEDURE TODAY AT [facility]:   Refer to the procedure report that was given to you for any specific questions about what was found during the examination.  If the procedure report does not answer your questions, please call your gastroenterologist to clarify.  If you requested that your care partner not be given the details of your procedure findings, then the procedure report has been included in a sealed envelope for you to review at your convenience later.  YOU SHOULD EXPECT: Some feelings of bloating in the abdomen. Passage of more gas than usual.  Walking can help get rid of the air that was put into your GI tract during the procedure and reduce the bloating. If you had a lower endoscopy (such as a colonoscopy or flexible sigmoidoscopy) you may notice spotting of blood in your stool or on the toilet paper. If you underwent a bowel prep for your procedure, you may not have a normal bowel movement for a few days.  Please Note:  You might notice some irritation and congestion in your nose or some drainage.  This is from the oxygen used during your procedure.  There is no need for concern and it should clear up in a day or so.  SYMPTOMS TO REPORT IMMEDIATELY:   Following upper endoscopy (EGD)  Vomiting of blood or coffee ground material  New chest pain or pain under the shoulder blades  Painful or persistently difficult swallowing  New shortness of breath  Fever of 100F or higher  Black, tarry-looking stools  For urgent or emergent  issues, a gastroenterologist can be reached at any hour by calling [phone].   DIET:  We do recommend a small meal at first, but then you may proceed to your regular diet.  Drink plenty of fluids but you should avoid alcoholic beverages for 24 hours.  ACTIVITY:  You should plan to take it easy for the rest of today and you should NOT DRIVE or use heavy machinery until tomorrow (because of the sedation medicines used during the test).    FOLLOW UP: Our staff will call the number listed on your records the next business day following your procedure to check on you and address any questions or concerns that you may have regarding the information given to you following your procedure. If we do not reach you, we will leave a message.  However, if you are feeling well and you are not experiencing any problems, there is no need to return our call.  We will assume that you have returned to your regular daily activities without incident.  If any biopsies were taken you will be contacted by phone or by letter within the next 1-3 weeks.  Please call us at [phone] if you have not heard about the biopsies in 3 weeks.    SIGNATURES/CONFIDENTIALITY: You and/or your care partner have signed paperwork which will be entered into your electronic medical record.  These signatures attest to the fact that that the information above on your After Visit Summary has been reviewed  and is understood.  Full responsibility of the confidentiality of this discharge information lies with you and/or your care-partner.

## 2017-07-16 NOTE — Op Note (Addendum)
[place] Patient Name: Jerryl Holzhauer Procedure Date: 07/16/2017 10:48 AM MRN: [id number] Endoscopist: Gatha Mayer , MD Age: 47 Referring MD:  Date of Birth: [birth year] Gender: Male Account #: [id number] Procedure:                Upper GI endoscopy Indications:              Therapeutic procedure, Follow-up of duodenal                            stenosis, For therapy of duodenal stenosis Medicines:                Propofol per Anesthesia, Monitored Anesthesia Care Procedure:                Pre-Anesthesia Assessment:                           - Prior to the procedure, a History and Physical                            was performed, and patient medications and                            allergies were reviewed. The patient's tolerance of                            previous anesthesia was also reviewed. The risks                            and benefits of the procedure and the sedation                            options and risks were discussed with the patient.                            All questions were answered, and informed consent                            was obtained. Prior Anticoagulants: The patient has                            taken no previous anticoagulant or antiplatelet                            agents. ASA Grade Assessment: III - A patient with                            severe systemic disease. After reviewing the risks                            and benefits, the patient was deemed in                            satisfactory condition to undergo the procedure.  After obtaining informed consent, the endoscope was                            passed under direct vision. Throughout the                            procedure, the patient's blood pressure, pulse, and                            oxygen saturations were monitored continuously. The                            Endoscope was introduced through the mouth, and        advanced to the second part of duodenum. The upper                            GI endoscopy was accomplished without difficulty.                            The patient tolerated the procedure well. Scope In: Scope Out: Findings:                 An acquired benign-appearing, intrinsic mild                            stenosis was found in the pylorus/duodenal bulb and                            was traversed. A TTS dilator was passed through the                            scope. Dilation with a 16-17-18 mm balloon dilator                            was performed to 18 mm. The dilation site was                            examined and showed mild improvement in luminal                            narrowing. Estimated blood loss was minimal.                           A medium amount of food (residue) was found in the                            gastric body.                           Diffuse moderate inflammation characterized by                            erythema, granularity and mucus was found in the  entire examined stomach.                           The exam was otherwise without abnormality. Complications:            No immediate complications. Estimated Blood Loss:     Estimated blood loss was minimal. Impression:               - Acquired duodenal/pyloric stenosis. improved                            Dilated to 18 mm                           - A medium amount of food (residue) in the stomach.                           - Chronic gastritis. as before                           - The examination was otherwise normal. Limited by                            food residue                           - No specimens collected.                           Also reports monthly diarrhea episodes - cramps                            then diarrhea - ? if having small bowl/colon IBD                            issues vs IBS Recommendation:           - Patient has a contact  number available for                            emergencies. The signs and symptoms of potential                            delayed complications were discussed with the                            patient. Return to normal activities tomorrow.                            Written discharge instructions were provided to the                            patient.                           - Clear liquids x 1 hour then soft foods rest of  day. Start prior diet tomorrow.                           - Continue present medications.                           - Still has Humira abs and low trough level.                           2 + mos on 6 MP to reduce Abs                           Need to consider changeing tx.                           will consider and contact him. Gatha Mayer, MD 07/16/2017 11:14:41 AM This report has been signed electronically.

## 2017-07-16 NOTE — Progress Notes (Signed)
Called to room to assist during endoscopic procedure.  Patient ID and intended procedure confirmed with present staff. Received instructions for my participation in the procedure from the performing physician.  

## 2017-07-17 ENCOUNTER — Telehealth: Payer: Self-pay | Admitting: *Deleted

## 2017-07-17 NOTE — Progress Notes (Signed)
Please look into coverage of Stellara for him

## 2017-07-17 NOTE — Telephone Encounter (Signed)
Message left

## 2017-07-19 ENCOUNTER — Other Ambulatory Visit: Payer: Self-pay | Admitting: Family Medicine

## 2017-07-19 DIAGNOSIS — I10 Essential (primary) hypertension: Secondary | ICD-10-CM

## 2017-07-19 NOTE — Telephone Encounter (Signed)
Last OV with [name] with GI on 07/09/17 her BP was 160/100.  [name] instructed her to follow up with PCP regarding elevated BP.   I don't see where she has an upcoming appt with your practice.

## 2017-07-25 ENCOUNTER — Other Ambulatory Visit: Payer: Self-pay

## 2017-07-25 DIAGNOSIS — K50119 Crohn's disease of large intestine with unspecified complications: Secondary | ICD-10-CM

## 2017-07-25 MED ORDER — USTEKINUMAB 90 MG/ML ~~LOC~~ SOSY: 90.0000 mg | PREFILLED_SYRINGE | SUBCUTANEOUS | 2 refills | Status: DC

## 2017-07-25 MED ORDER — USTEKINUMAB 130 MG/26ML IV SOLN: 520.0000 mg | Freq: Once | INTRAVENOUS | Status: DC

## 2017-07-25 NOTE — Progress Notes (Signed)
Induction dose is 520 mg IV x 1 for his weight  Then 90 mg q 8 week subcutaneous  Please let him know I want to switch to this drug and stay on 6 MP (dc Humira) There should be a savings card from company that he can use so let's get that to him  I need to see him in 3 months approx   Let me know if he or Robin have ?'s

## 2017-07-25 NOTE — Addendum Note (Signed)
Addended by: Marlon Pel on: 07/25/2017 03:53 PM   Modules accepted: Orders

## 2017-08-02 ENCOUNTER — Encounter (HOSPITAL_COMMUNITY)
Admission: RE | Admit: 2017-08-02 | Discharge: 2017-08-02 | Disposition: A | Discharge status: Home / Self Care | Payer: Managed Care, Other (non HMO) | Source: Ambulatory Visit | Attending: Internal Medicine | Admitting: Internal Medicine

## 2017-08-02 DIAGNOSIS — K50119 Crohn's disease of large intestine with unspecified complications: Secondary | ICD-10-CM

## 2017-08-02 MED ORDER — USTEKINUMAB 130 MG/26ML IV SOLN
520.0000 mg | Freq: Once | INTRAVENOUS | Status: AC
  Administered 2017-08-02: 520 mg via INTRAVENOUS
  Filled 2017-08-02: qty 104

## 2017-08-02 NOTE — Discharge Instructions (Signed)
Ustekinumab injection What is this medicine? USTEKINUMAB ([place] te KIN ue mab) is used to treat plaque psoriasis and psoriatic arthritis. This medicine is also used to treat Crohn's disease. It is not a cure. This medicine may be used for other purposes; ask your health care provider or pharmacist if you have questions. COMMON BRAND NAME(S): Stelara What should I tell my health care provider before I take this medicine? They need to know if you have any of these conditions: -cancer -diabetes -immune system problems -infection (especially a virus infection such as chickenpox, cold sores, or herpes) or history of infections -receiving or have received allergy shots -recently received or scheduled to receive a vaccine -tuberculosis, a positive skin test for tuberculosis, or have recently been in close contact with someone who has tuberculosis -an unusual reaction to ustekinumab, latex, other medicines, foods, dyes, or preservatives -pregnant or trying to get pregnant -breast-feeding How should I use this medicine? This medicine is for injection under the skin or infusion into a vein. It is usually given by a health care professional in a hospital or clinic setting. If you get this medicine at home, you will be taught how to prepare and give this medicine. Use exactly as directed. Take your medicine at regular intervals. Do not take your medicine more often than directed. It is important that you put your used needles and syringes in a special sharps container. Do not put them in a trash can. If you do not have a sharps container, call your pharmacist or healthcare provider to get one. A special MedGuide will be given to you by the pharmacist with each prescription and refill. Be sure to read this information carefully each time. Talk to your pediatrician regarding the use of this medicine in children. While this drug may be prescribed for children as young as 12 years for selected conditions,  precautions do apply. Overdosage: If you think you have taken too much of this medicine contact a poison control center or emergency room at once. NOTE: This medicine is only for you. Do not share this medicine with others. What if I miss a dose? If you miss a dose, take it as soon as you can. If it is almost time for your next dose, take only that dose. Do not take double or extra doses. What may interact with this medicine? Do not take this medicine with any of the following medications: -live virus vaccines This medicine may also interact with the following medications: -cyclosporine -immunosuppressives -vaccines -warfarin This list may not describe all possible interactions. Give your health care provider a list of all the medicines, herbs, non-prescription drugs, or dietary supplements you use. Also tell them if you smoke, drink alcohol, or use illegal drugs. Some items may interact with your medicine. What should I watch for while using this medicine? Your condition will be monitored carefully while you are receiving this medicine. Tell your doctor or healthcare professional if your symptoms do not start to get better or if they get worse. You will be tested for tuberculosis (TB) before you start this medicine. If your doctor prescribes any medicine for TB, you should start taking the TB medicine before starting this medicine. Make sure to finish the full course of TB medicine. Call your doctor or health care professional if you get a cold or other infection while receiving this medicine. Do not treat yourself. This medicine may decrease your body's ability to fight infection. Talk to your doctor about your risk of  cancer. You may be more at risk for certain types of cancers if you take this medicine. What side effects may I notice from receiving this medicine? Side effects that you should report to your doctor or health care professional as soon as possible: -allergic reactions like skin  rash, itching or hives, swelling of the face, lips, or tongue -breathing problems -changes in vision -confusion -seizures -signs and symptoms of infection like fever or chills; cough; sore throat; pain or trouble passing urine -swollen lymph nodes in the neck, underarm, or groin areas -unexplained weight loss -unusually weak or tired -vomiting Side effects that usually do not require medical attention (report to your doctor or health care professional if they continue or are bothersome): -headache -nausea -redness, itching, swelling, or bruising at site where injected This list may not describe all possible side effects. Call your doctor for medical advice about side effects. You may report side effects to FDA at 1-800-FDA-1088. Where should I keep my medicine? Keep out of the reach of children. If you are using this medicine at home, you will be instructed on how to store this medicine. Store the prefilled syringes in a refrigerator between 2 to 8 degrees C (36 to 46 degrees F). Keep in the original carton. Protect from light. Do not freeze. Do not shake. Throw away any unused medicine after the expiration date on the label. NOTE: This sheet is a summary. It may not cover all possible information. If you have questions about this medicine, talk to your doctor, pharmacist, or health care provider.  2018 Elsevier/Gold Standard (2016-04-18 09:12:47)

## 2017-08-26 ENCOUNTER — Other Ambulatory Visit: Payer: Self-pay | Admitting: Family Medicine

## 2017-08-26 DIAGNOSIS — I10 Essential (primary) hypertension: Secondary | ICD-10-CM

## 2017-08-27 NOTE — Telephone Encounter (Signed)
Refill request for Lisinopril  LOV 02/13/17 with Dr. Carlota Raspberry.   No future appts scheduled.  [id number] - [place], [place]   [address]

## 2017-08-31 ENCOUNTER — Telehealth: Payer: Self-pay

## 2017-08-31 ENCOUNTER — Telehealth: Payer: Self-pay | Admitting: Internal Medicine

## 2017-08-31 ENCOUNTER — Ambulatory Visit (INDEPENDENT_AMBULATORY_CARE_PROVIDER_SITE_OTHER): Payer: Managed Care, Other (non HMO) | Admitting: Physician Assistant

## 2017-08-31 ENCOUNTER — Encounter: Payer: Self-pay | Admitting: Physician Assistant

## 2017-08-31 VITALS — BP 142/98 | HR 84 | Ht 72.0 in | Wt 295.0 lb

## 2017-08-31 DIAGNOSIS — G8929 Other chronic pain: Secondary | ICD-10-CM

## 2017-08-31 DIAGNOSIS — R1013 Epigastric pain: Secondary | ICD-10-CM | POA: Diagnosis not present

## 2017-08-31 DIAGNOSIS — K50912 Crohn's disease, unspecified, with intestinal obstruction: Secondary | ICD-10-CM

## 2017-08-31 NOTE — Progress Notes (Addendum)
Chief Complaint: Crohn's disease, nausea, vomiting, abdominal pain  HPI:    Craig Guzman is a 47 year old African-American male with a past medical history of Crohn's disease and duodenal stricture, who follows with Dr. Arelia Longest and returns to clinic today for complaint of nausea vomiting and abdominal pain.      Last clinic visit 07/09/17 describes nausea and regurgitation, suspected return of duodenal stricture patient set up for EGD with dilation.  EGD 07/16/17 with intrinsic mild stenosis at the pylorus/duodenal bulb which was dilated to 18 mm, medium amount of food in the stomach body, diffuse erythema.  After review of adalimumab antibodies patient was switched to Stelara.  His Humira was discontinued.  Patient had his first Stelara infusion January 31.  He remains on mercaptopurine.    Today, presents clinic accompanied by his wife.  Apparently he has had multiple symptoms over the past month which he thinks are "a flare of my Crohn's".  Describes eating and developing a large amount of gas.  Tells me if he does not burp then he will vomit.  Sometimes he will awaken at night with epigastric abdominal cramps and spasms and vomit in his sleep.  Tells me he is only eating 1 meal a day around 6:00 and not eating after 9 PM.  He is avoiding steak/hamburger as this causes bigger problems.  Also describes hot flashes.  Also change in bowel habits towards constipation noting 3 days with no bowel movement and then having a day of liquid diarrhea.  Is using Zofran and Compazine suppositories for nausea.  Believes this is making him constipated.    Denies weight loss, anorexia, fever or chills.  Past Medical History:  Diagnosis Date  . Allergy    trees/ grasses/ animals/dust/mold  . Anxiety   . Biliary dyskinesia   . Colloid thyroid nodule   . Crohn's disease ([place])    Stomach, terminal ileum, cecum  . Gastric ulcer    antral  . GERD (gastroesophageal reflux disease)   . History of migraine headaches     . HTN (hypertension)   . Kidney stone 09/20/2012  . Migraine   . Obesity   . Pneumonia 02/27/2014   ED    Past Surgical History:  Procedure Laterality Date  . CHOLECYSTECTOMY  2011   [facility]  . COLONOSCOPY  07/26/11   Crohn's colitis, ileitis  . ESOPHAGOGASTRODUODENOSCOPY  05/04/11, 07/26/11   granulomatous gastritis - Crohn's  . ESOPHAGOGASTRODUODENOSCOPY (EGD) WITH PROPOFOL N/A 03/28/2017   Procedure: ESOPHAGOGASTRODUODENOSCOPY (EGD) WITH PROPOFOL  with balloon dilation of duodenum;  Surgeon: [name], MD;  Location: [facility];  Service: Gastroenterology;  Laterality: N/A;  . FLEXIBLE SIGMOIDOSCOPY    . JOINT REPLACEMENT    . MULTIPLE TOOTH EXTRACTIONS  2005  . SHOULDER SURGERY     right  . VASECTOMY     bilateral w/lysis of penile adhesions    Current Outpatient Medications  Medication Sig Dispense Refill  . cyclobenzaprine (FLEXERIL) 10 MG tablet Take 1 tablet (10 mg total) by mouth 3 (three) times daily as needed (for migraines). 30 tablet 0  . doxazosin (CARDURA) 2 MG tablet Take 1 tablet (2 mg total) by mouth daily. 90 tablet 1  . lisinopril (PRINIVIL,ZESTRIL) 40 MG tablet TAKE 1 TABLET (40 MG TOTAL) BY MOUTH DAILY. OFFICE VISIT NEEDED FOR 90 DAY SUPPLY 15 tablet 0  . mercaptopurine (PURINETHOL) 50 MG tablet Take 1 tablet (50 mg total) by mouth daily. Give on an empty stomach 1 hour before  or 2 hours after meals. 90 tablet 1  . omeprazole-sodium bicarbonate (ZEGERID) 40-1100 MG capsule TAKE 1 CAPSULE BY MOUTH AT BEDTIME. 90 capsule 0  . ondansetron (ZOFRAN-ODT) 4 MG disintegrating tablet Take 1 tablet (4 mg total) by mouth every 8 (eight) hours as needed for nausea or vomiting. 30 tablet 5  . prochlorperazine (COMPAZINE) 25 MG suppository Place 1 suppository (25 mg total) rectally every 12 (twelve) hours as needed for nausea or vomiting. 12 suppository 2  . rizatriptan (MAXALT-MLT) 10 MG disintegrating tablet TAKE 1 TABLET (10 MG TOTAL) BY MOUTH 3 (THREE)  TIMES DAILY AS NEEDED FOR MIGRAINE. 9 tablet 3  . ustekinumab (STELARA) 90 MG/ML SOSY injection Inject 1 mL (90 mg total) into the skin every 8 (eight) weeks. 1 Syringe 2  . verapamil (CALAN-SR) 180 MG CR tablet TAKE 2 TABLETS (360 MG TOTAL) BY MOUTH DAILY. 180 tablet 1   Current Facility-Administered Medications  Medication Dose Route Frequency Provider Last Rate Last Dose  . 0.9 %  sodium chloride infusion  500 mL Intravenous Once Gatha Mayer, MD      . ustekinumab [facility]) 520 mg in sodium chloride 0.9 % 250 mL IVPB  520 mg Intravenous Once Gatha Mayer, MD        Allergies as of 08/31/2017 - Review Complete 07/16/2017  Allergen Reaction Noted  . Zithromax [azithromycin] Hypertension 04/14/2011    Family History  Problem Relation Age of Onset  . Colon polyps Father   . Diabetes Father   . Hypertension Father   . Hypertension Mother   . Asthma Mother   . Heart disease Maternal Grandmother   . Colon cancer Neg Hx   . Rectal cancer Neg Hx   . Stomach cancer Neg Hx   . Esophageal cancer Neg Hx     Social History   Socioeconomic History  . Marital status: Married    Spouse name: Not on file  . Number of children: 3  . Years of education: Some college  . Highest education level: Not on file  Social Needs  . Financial resource strain: Not on file  . Food insecurity - worry: Not on file  . Food insecurity - inability: Not on file  . Transportation needs - medical: Not on file  . Transportation needs - non-medical: Not on file  Occupational History  . Occupation: [employer]  Tobacco Use  . Smoking status: Current Every Day Smoker    Packs/day: 0.75    Years: 21.00    Pack years: 15.75    Types: Cigarettes  . Smokeless tobacco: Former Systems developer    Types: Snuff  . Tobacco comment: Counseling sheet given in exam room   Substance and Sexual Activity  . Alcohol use: No    Alcohol/week: 0.0 oz  . Drug use: No  . Sexual activity: Yes  Other Topics Concern  . Not on file   Social History Narrative   Married. Education: [employer].    Lives at home w/ his wife and kids   Right-handed   Caffeine: 3 sodas per day    Review of Systems:    Constitutional: No weight loss, fever or chills Skin: No rash or itching Cardiovascular: No chest pain Respiratory: No SOB  Gastrointestinal: See HPI and otherwise negative   Physical Exam:  Vital signs: BP (!) 142/98   Pulse 84   Ht 6' (1.829 m)   Wt 295 lb (133.8 kg)   BMI 40.01 kg/m    Constitutional:  Pleasant obese AA male appears to be in NAD, Well developed, Well nourished, alert and cooperative Respiratory: Respirations even and unlabored. Lungs clear to auscultation bilaterally.   No wheezes, crackles, or rhonchi.  Cardiovascular: Normal S1, S2. No MRG. Regular rate and rhythm. No peripheral edema, cyanosis or pallor.  Gastrointestinal:  Soft, nondistended, moderate epigastric ttp, No rebound or guarding. Decreased BS all 4 quads No appreciable masses or hepatomegaly. Psychiatric: Demonstrates good judgement and reason without abnormal affect or behaviors.  RELEVANT LABS AND IMAGING: CBC    Component Value Date/Time   WBC 8.7 07/09/2017 1702   RBC 5.71 07/09/2017 1702   HGB 16.3 07/09/2017 1702   HCT 49.3 07/09/2017 1702   PLT 194.0 07/09/2017 1702   MCV 86.5 07/09/2017 1702   MCV 84.9 12/01/2014 2137   MCH 27.9 03/29/2017 0713   MCHC 33.0 07/09/2017 1702   RDW 14.8 07/09/2017 1702   LYMPHSABS 3.7 07/09/2017 1702   MONOABS 0.8 07/09/2017 1702   EOSABS 0.3 07/09/2017 1702   BASOSABS 0.1 07/09/2017 1702    CMP     Component Value Date/Time   NA 140 03/29/2017 0713   NA 139 01/18/2017 0942   K 4.0 03/29/2017 0713   CL 108 03/29/2017 0713   CO2 25 03/29/2017 0713   GLUCOSE 97 03/29/2017 0713   BUN 10 03/29/2017 0713   BUN 11 01/18/2017 0942   CREATININE 0.97 03/29/2017 0713   CREATININE 1.17 03/10/2016 1826   CALCIUM 8.6 (L) 03/29/2017 0713   PROT 8.1 07/09/2017 1702   PROT 7.1  02/08/2017 1643   ALBUMIN 4.4 07/09/2017 1702   ALBUMIN 4.1 02/08/2017 1643   AST 21 07/09/2017 1702   ALT 21 07/09/2017 1702   ALKPHOS 103 07/09/2017 1702   BILITOT 0.5 07/09/2017 1702   BILITOT 0.4 02/08/2017 1643   GFRNONAA >60 03/29/2017 0713   GFRNONAA 75 03/10/2016 1826   GFRAA >60 03/29/2017 0713   GFRAA 87 03/10/2016 1826    Assessment: 1.  Crohn's disease: switched from Humira to Stelara in January, infusion dose Jan 31st, continues Mercaptopurine, now with abdominal pain, nausea, vomiting and gas; concern for continues duodenal stricture vs Crohn's flare/stricturing 2.  History of duodenal stricture: dilated 07/16/17  Plan: 1.  Patient will be due for his next Stellara at the end of March. 2.  Patient to continue current medications. 3.  Will discuss case with [name] as patient is well-known to him to decipher recommendations. 4.  Patient will keep his appointment with [name] in early April.  Craig Newer, PA-C [facility] 08/31/2017, 1:47 PM  Cc: [name], MD   We plan for upper GI series and trial of Reglan  Gatha Mayer, MD, [facility]

## 2017-08-31 NOTE — Telephone Encounter (Signed)
Patient reports abdominal pain and vomiting.  He reports spasms and pain at night that lead to vomiting.  He will come in and see Ellouise Newer, PA today at 1:45

## 2017-08-31 NOTE — Telephone Encounter (Signed)
-----   Message from [facility] [place], [place] sent at 08/31/2017  3:02 PM EST ----- Please order Upper Gi series and also ask if he would like to try Reglan 24m before meals instead of Zofran and Compazine. If so please prescribe #[phone] with 2 refills. Thanks-JLL

## 2017-09-03 NOTE — Telephone Encounter (Signed)
Left message on machine to call back  

## 2017-09-04 NOTE — Telephone Encounter (Signed)
Left message on machine to call back  

## 2017-09-06 ENCOUNTER — Other Ambulatory Visit: Payer: Self-pay

## 2017-09-06 DIAGNOSIS — R112 Nausea with vomiting, unspecified: Secondary | ICD-10-CM

## 2017-09-06 DIAGNOSIS — R109 Unspecified abdominal pain: Secondary | ICD-10-CM

## 2017-09-06 MED ORDER — METOCLOPRAMIDE HCL 10 MG PO TABS: 10.0000 mg | ORAL_TABLET | Freq: Three times a day (TID) | ORAL | 2 refills | Status: DC

## 2017-09-06 NOTE — Telephone Encounter (Signed)
The reglan was sent to the pharmacy and the pt notified of the instructions for UGI

## 2017-09-06 NOTE — Telephone Encounter (Signed)
Left message on machine to call back letter mailed. 

## 2017-09-06 NOTE — Telephone Encounter (Signed)
You have been scheduled for an Upper GI Series at [facility]. Your appointment is on 09/20/17 at 930 am. Please arrive 15 minutes prior to your test for registration. Make sure not to eat or drink anything after midnight on the night before your test. If you need to reschedule, please call radiology at [phone]. _____________________________________________________ An upper GI series uses x rays to help diagnose problems of the upper GI tract, which includes the esophagus, stomach, and duodenum. The duodenum is the first part of the small intestine. An upper GI series is conducted by a radiology technologist or a radiologist-a doctor who specializes in x-ray imaging-at a hospital or outpatient center. While sitting or standing in front of an x-ray machine, the patient drinks barium liquid, which is often white and has a chalky consistency and taste. The barium liquid coats the lining of the upper GI tract and makes signs of disease show up more clearly on x rays. X-ray video, called fluoroscopy, is used to view the barium liquid moving through the esophagus, stomach, and duodenum. Additional x rays and fluoroscopy are performed while the patient lies on an x-ray table. To fully coat the upper GI tract with barium liquid, the technologist or radiologist may press on the abdomen or ask the patient to change position. Patients hold still in various positions, allowing the technologist or radiologist to take x rays of the upper GI tract at different angles. If a technologist conducts the upper GI series, a radiologist will later examine the images to look for problems.  This test typically takes about 1 hour to complete. _____________________________________________________

## 2017-09-06 NOTE — Addendum Note (Signed)
Addended by: Jeoffrey Massed on: 09/06/2017 02:59 PM   Modules accepted: Orders

## 2017-09-10 ENCOUNTER — Encounter: Payer: Self-pay | Admitting: Internal Medicine

## 2017-09-11 ENCOUNTER — Encounter: Payer: Self-pay | Admitting: Internal Medicine

## 2017-09-11 NOTE — Telephone Encounter (Signed)
FW: Non-Urgent Medical Question  Levin Erp, [place]  Sent: Tue September 11, 2017 3:47 PM  To: Marlon Pel, RN      Message   Please try 5m instead. Thanks-JLL   Message sent to patient

## 2017-09-11 NOTE — Telephone Encounter (Signed)
Can try 73m instead. Thanks-JLL

## 2017-09-18 ENCOUNTER — Telehealth: Payer: Self-pay

## 2017-09-18 NOTE — Telephone Encounter (Signed)
[facility] on [facility].  Call to pt and l/m that request received.  Asked that pt c/b to schedule appt with Dr. Carlota Raspberry before Rx can be refilled.

## 2017-09-20 ENCOUNTER — Ambulatory Visit (HOSPITAL_COMMUNITY)
Admission: RE | Admit: 2017-09-20 | Discharge: 2017-09-20 | Disposition: A | Discharge status: Home / Self Care | Payer: Managed Care, Other (non HMO) | Source: Ambulatory Visit | Attending: Physician Assistant | Admitting: Physician Assistant

## 2017-09-20 DIAGNOSIS — R109 Unspecified abdominal pain: Secondary | ICD-10-CM | POA: Diagnosis present

## 2017-09-20 DIAGNOSIS — K219 Gastro-esophageal reflux disease without esophagitis: Secondary | ICD-10-CM | POA: Diagnosis not present

## 2017-09-20 DIAGNOSIS — R112 Nausea with vomiting, unspecified: Secondary | ICD-10-CM | POA: Diagnosis present

## 2017-09-23 NOTE — Progress Notes (Signed)
I will message Jibril about it to see how he is doing

## 2017-10-02 ENCOUNTER — Encounter: Payer: Self-pay | Admitting: Internal Medicine

## 2017-10-02 ENCOUNTER — Ambulatory Visit (INDEPENDENT_AMBULATORY_CARE_PROVIDER_SITE_OTHER): Payer: Managed Care, Other (non HMO) | Admitting: Internal Medicine

## 2017-10-02 DIAGNOSIS — R112 Nausea with vomiting, unspecified: Secondary | ICD-10-CM | POA: Diagnosis not present

## 2017-10-02 DIAGNOSIS — K50012 Crohn's disease of small intestine with intestinal obstruction: Secondary | ICD-10-CM | POA: Diagnosis not present

## 2017-10-02 DIAGNOSIS — Z7960 Long term (current) use of unspecified immunomodulators and immunosuppressants: Secondary | ICD-10-CM

## 2017-10-02 DIAGNOSIS — Z79899 Other long term (current) drug therapy: Secondary | ICD-10-CM | POA: Diagnosis not present

## 2017-10-02 NOTE — Assessment & Plan Note (Signed)
persists

## 2017-10-02 NOTE — Patient Instructions (Signed)
We will see you back 12/10/17 at 4pm for follow up with Dr Carlean Purl.   I appreciate the opportunity to care for you. Silvano Rusk, MD, [facility]

## 2017-10-02 NOTE — Assessment & Plan Note (Signed)
Stelara

## 2017-10-02 NOTE — Progress Notes (Signed)
Craig Guzman 47 y.o. 1970-12-12 [id number]  Assessment & Plan:   Encounter Diagnoses  Name Primary?  . Crohn's disease of small intestine with intestinal obstruction ([place])   . Intractable vomiting with nausea, unspecified vomiting type   . Long-term use of immunosuppressant medication     Crohn's disease of stomach and colon-suspected Still w/ intermittent vomiting  Intractable vomiting with nausea persists  Long-term use of immunosuppressant medication Craig Guzman continues to have perplexing problems, his findings on his upper GI do not correlate with what I saw in January on his EGD.  I dilated the pyloric duodenal area at 18 mm then.  There was only minimal improvement.  Since last fall we did discover this strictured area and progressively dilated and his symptoms improved but then seem to have stabilized despite the dilations.  He had antibodies to Humira so I have switched him to Ustekinumab.  He is also on 6-MP to reduce antibody formation.  I am going to ask some colleagues about this.  I told him today that gastric surgery is tricky in probably not in his best interest but perhaps there is some sort of stricturoplasty or something that could be done if there is a persistent problem here.  Maybe I do need to re-scope him but we will try to minimize procedures particularly in light of the persistent symptoms and the discordance between the upper GI series even though it was 2 months later I have a hard time believing it is that narrow in there.  Question other cross-sectional imaging.  Unfortunately his chronic illness leads to work absences and these had to change jobs several times and his wife and he are concerned about having to pursue disability, he really likes to go to work and enjoys having a job but is frustrated by these symptoms as one might imagine.  I have explained to them that I do not fully understand his problems though I am sure his stomach is damaged and  that is a big part of it if not all of it, but unfortunately the therapies that he has tried have not resolved the situation.  He was seen at [facility] before but that did not change much, can consider another tertiary referral but they are not pushing for that. I appreciate the opportunity to care for this patient.  CC: [name], MD     Subjective:   Chief Complaint: Crohn's disease vomiting  HPI Craig Guzman is here for follow-up of his gastric and small bowel plus large bowel Crohn's disease, he continues with intermittent vomiting.  He has found that if he avoids meat of all types he does better.  His wife reports that he is still only working about a half of his hours because of episodes of vomiting.  He is used up all of his sick time.  The spells are pretty much the same.  He tries to stay on a liquid diet when he is going to work.  His weight is relatively stable.  He had the infusion of Stelara but there is  some sort of hold up with approval of the subcutaneous injections that are about the start according to his wife and she is asking that we look into that.    He was seen in early March by Ellouise Newer PA-C, and I suggested she have him do an upper GI series which he did on 3/21 which shows narrowing of the distal antrum down to about 5 or 6 mm  without motility in that area. Allergies  Allergen Reactions  . Zithromax [Azithromycin] Hypertension   Current Meds  Medication Sig  . cyclobenzaprine (FLEXERIL) 10 MG tablet Take 1 tablet (10 mg total) by mouth 3 (three) times daily as needed (for migraines).  Marland Kitchen doxazosin (CARDURA) 2 MG tablet Take 1 tablet (2 mg total) by mouth daily.  Marland Kitchen lisinopril (PRINIVIL,ZESTRIL) 40 MG tablet TAKE 1 TABLET (40 MG TOTAL) BY MOUTH DAILY. OFFICE VISIT NEEDED FOR 90 DAY SUPPLY  . mercaptopurine (PURINETHOL) 50 MG tablet Take 1 tablet (50 mg total) by mouth daily. Give on an empty stomach 1 hour before or 2 hours after meals.  . metoCLOPramide  (REGLAN) 10 MG tablet Take 1 tablet (10 mg total) by mouth 3 (three) times daily before meals.  Marland Kitchen omeprazole-sodium bicarbonate (ZEGERID) 40-1100 MG capsule TAKE 1 CAPSULE BY MOUTH AT BEDTIME.  Marland Kitchen ondansetron (ZOFRAN-ODT) 4 MG disintegrating tablet Take 1 tablet (4 mg total) by mouth every 8 (eight) hours as needed for nausea or vomiting.  . rizatriptan (MAXALT-MLT) 10 MG disintegrating tablet TAKE 1 TABLET (10 MG TOTAL) BY MOUTH 3 (THREE) TIMES DAILY AS NEEDED FOR MIGRAINE.  Marland Kitchen ustekinumab (STELARA) 90 MG/ML SOSY injection Inject 1 mL (90 mg total) into the skin every 8 (eight) weeks.  . verapamil (CALAN-SR) 180 MG CR tablet TAKE 2 TABLETS (360 MG TOTAL) BY MOUTH DAILY.   Current Facility-Administered Medications for the 10/02/17 encounter (Office Visit) with Gatha Mayer, MD  Medication  . 0.9 %  sodium chloride infusion  . ustekinumab (STELARA) 520 mg in sodium chloride 0.9 % 250 mL IVPB   Past Medical History:  Diagnosis Date  . Allergy    trees/ grasses/ animals/dust/mold  . Anxiety   . Biliary dyskinesia   . Colloid thyroid nodule   . Crohn's disease ([place])    Stomach, terminal ileum, cecum  . Gastric ulcer    antral  . GERD (gastroesophageal reflux disease)   . History of migraine headaches   . HTN (hypertension)   . Kidney stone 09/20/2012  . Migraine   . Obesity   . Pneumonia 02/27/2014   ED   Past Surgical History:  Procedure Laterality Date  . CHOLECYSTECTOMY  2011   [facility]  . COLONOSCOPY  07/26/11   Crohn's colitis, ileitis  . ESOPHAGOGASTRODUODENOSCOPY  05/04/11, 07/26/11   granulomatous gastritis - Crohn's  . ESOPHAGOGASTRODUODENOSCOPY (EGD) WITH PROPOFOL N/A 03/28/2017   Procedure: ESOPHAGOGASTRODUODENOSCOPY (EGD) WITH PROPOFOL  with balloon dilation of duodenum;  Surgeon: [name], MD;  Location: [facility];  Service: Gastroenterology;  Laterality: N/A;  . FLEXIBLE SIGMOIDOSCOPY    . JOINT REPLACEMENT    . MULTIPLE TOOTH EXTRACTIONS  2005  .  SHOULDER SURGERY     right  . VASECTOMY     bilateral w/lysis of penile adhesions   Social History   Social History Narrative   Married. Education: [employer].    Lives at home w/ his wife and kids   Right-handed   Caffeine: 3 sodas per day   family history includes Asthma in his mother; Colon polyps in his father; Diabetes in his father; Heart disease in his maternal grandmother; Hypertension in his father and mother.   Review of Systems As above  Objective:   Physical Exam @BP  124/80 (BP Location: Left Arm, Patient Position: Sitting, Cuff Size: Normal)   Pulse 76   Ht 5' 11"  (1.803 m)   Wt 292 lb (132.5 kg) Comment: with steel toe boots  BMI  40.73 kg/m @  General:  NAD Eyes:   anicteric Lungs:  clear Heart::  S1S2 no rubs, murmurs or gallops Abdomen:  soft and nontender, BS+ Ext:   no edema, cyanosis or clubbing    Data Reviewed:   See HPI

## 2017-10-02 NOTE — Assessment & Plan Note (Signed)
Still w/ intermittent vomiting

## 2017-10-03 ENCOUNTER — Telehealth: Payer: Self-pay

## 2017-10-03 NOTE — Telephone Encounter (Signed)
-----   Message from Gatha Mayer, MD sent at 10/02/2017  4:20 PM EDT ----- Regarding: Stelara prior Auth? [name] says they need prior auth to get the subcutaneous stelara started  Please call her about this  [employer]

## 2017-10-03 NOTE — Telephone Encounter (Signed)
[facility] and [facility] were trying to run the IV dose.  Prescription in ready to send out .  I left a message with the wife to [facility].

## 2017-10-07 ENCOUNTER — Other Ambulatory Visit: Payer: Self-pay | Admitting: Family Medicine

## 2017-10-07 DIAGNOSIS — I10 Essential (primary) hypertension: Secondary | ICD-10-CM

## 2017-10-09 ENCOUNTER — Encounter: Payer: Self-pay | Admitting: Adult Health

## 2017-10-09 ENCOUNTER — Ambulatory Visit (INDEPENDENT_AMBULATORY_CARE_PROVIDER_SITE_OTHER): Payer: Managed Care, Other (non HMO) | Admitting: Adult Health

## 2017-10-09 VITALS — BP 150/98 | HR 66 | Ht 72.0 in | Wt 293.0 lb

## 2017-10-09 DIAGNOSIS — G43009 Migraine without aura, not intractable, without status migrainosus: Secondary | ICD-10-CM | POA: Diagnosis not present

## 2017-10-09 MED ORDER — CYCLOBENZAPRINE HCL 10 MG PO TABS: 10.0000 mg | ORAL_TABLET | Freq: Three times a day (TID) | ORAL | 0 refills | Status: DC | PRN

## 2017-10-09 NOTE — Progress Notes (Signed)
PATIENT: Craig Guzman DOB: [birth year]  REASON FOR VISIT: follow up HISTORY FROM: patient  HISTORY OF PRESENT ILLNESS: Today 10/09/17 Craig Guzman is a 47 year old male with a history of migraine headaches.  He returns today for follow-up.  He reports that he is only had 2-3 migraines over the last year.  He states that when he does get a migraine he typically takes Maxalt if it does not resolve with 2 doses of Maxalt he then will use the Flexeril and that will resolve his headache.  He states that he has been doing well.  Denies any new neurological symptoms.  Returns today for an evaluation.   HISTORY Craig Guzman is a 47 year old male with a history of migraine headaches. He returns today for an evaluation. He states that his headaches have been under relatively good control. He states since the last visit in October he is only having 1 migraine. He states that he has a headache approximately every other week. He states his migraines typically respond to Maxalt and Flexeril. He states that he is happy with his management of his migraines. He states that he is trying to use the CPAP machine for sleep apnea. He reports that he is struggling with being able to use it at night as he feels that his nostrils close up. He plans to make a follow-up appointment with [name]  HISTORY 04/06/16: Craig Guzman is a 47 year old right-handed black male with a history of migraine headaches since 1990. The patient has previously been seen by Dr. Melene Muller treatment. Currently, he is having 2 or 3 headaches a month, the headaches usually are bifrontal in nature, and may spread to the back of the head. The patient may have nausea and vomiting, photophobia and phonophobia when the headaches become severe. The patient denies any neck stiffness. He denies any numbness or weakness of the face, arms, or legs. Occasionally weather changes or allergy related exposure such as grass or mold may bring on a headache. The  patient claims that the headache usually is not associated with any visual changes, the headache may last for 5 hours. He takes Flexeril for the headache which seems to be helpful if he can catch it early. He is sent to this office for an evaluation. His mother also has a history of migraine headache.    REVIEW OF SYSTEMS: Out of a complete 14 system review of symptoms, the patient complains only of the following symptoms, and all other reviewed systems are negative.  See HPI  ALLERGIES: Allergies  Allergen Reactions  . Zithromax [Azithromycin] Hypertension    HOME MEDICATIONS: Outpatient Medications Prior to Visit  Medication Sig Dispense Refill  . cyclobenzaprine (FLEXERIL) 10 MG tablet Take 1 tablet (10 mg total) by mouth 3 (three) times daily as needed (for migraines). 30 tablet 0  . doxazosin (CARDURA) 2 MG tablet Take 1 tablet (2 mg total) by mouth daily. 90 tablet 1  . lisinopril (PRINIVIL,ZESTRIL) 40 MG tablet TAKE 1 TABLET (40 MG TOTAL) BY MOUTH DAILY. OFFICE VISIT NEEDED FOR 90 DAY SUPPLY 15 tablet 0  . mercaptopurine (PURINETHOL) 50 MG tablet Take 1 tablet (50 mg total) by mouth daily. Give on an empty stomach 1 hour before or 2 hours after meals. 90 tablet 1  . metoCLOPramide (REGLAN) 10 MG tablet Take 1 tablet (10 mg total) by mouth 3 (three) times daily before meals. 90 tablet 2  . omeprazole-sodium bicarbonate (ZEGERID) 40-1100 MG capsule TAKE 1 CAPSULE BY MOUTH AT BEDTIME.  90 capsule 0  . ondansetron (ZOFRAN-ODT) 4 MG disintegrating tablet Take 1 tablet (4 mg total) by mouth every 8 (eight) hours as needed for nausea or vomiting. 30 tablet 5  . prochlorperazine (COMPAZINE) 25 MG suppository Place 1 suppository (25 mg total) rectally every 12 (twelve) hours as needed for nausea or vomiting. 12 suppository 2  . rizatriptan (MAXALT-MLT) 10 MG disintegrating tablet TAKE 1 TABLET (10 MG TOTAL) BY MOUTH 3 (THREE) TIMES DAILY AS NEEDED FOR MIGRAINE. 9 tablet 3  . ustekinumab  (STELARA) 90 MG/ML SOSY injection Inject 1 mL (90 mg total) into the skin every 8 (eight) weeks. 1 Syringe 2  . verapamil (CALAN-SR) 180 MG CR tablet TAKE 2 TABLETS (360 MG TOTAL) BY MOUTH DAILY. 180 tablet 1   Facility-Administered Medications Prior to Visit  Medication Dose Route Frequency Provider Last Rate Last Dose  . 0.9 %  sodium chloride infusion  500 mL Intravenous Once Gatha Mayer, MD      . ustekinumab [facility]) 520 mg in sodium chloride 0.9 % 250 mL IVPB  520 mg Intravenous Once Gatha Mayer, MD        PAST MEDICAL HISTORY: Past Medical History:  Diagnosis Date  . Allergy    trees/ grasses/ animals/dust/mold  . Anxiety   . Biliary dyskinesia   . Colloid thyroid nodule   . Crohn's disease ([place])    Stomach, terminal ileum, cecum  . Gastric ulcer    antral  . GERD (gastroesophageal reflux disease)   . History of migraine headaches   . HTN (hypertension)   . Kidney stone 09/20/2012  . Migraine   . Obesity   . Pneumonia 02/27/2014   ED    PAST SURGICAL HISTORY: Past Surgical History:  Procedure Laterality Date  . CHOLECYSTECTOMY  2011   [place]  . COLONOSCOPY  07/26/11   Crohn's colitis, ileitis  . ESOPHAGOGASTRODUODENOSCOPY  05/04/11, 07/26/11   granulomatous gastritis - Crohn's  . ESOPHAGOGASTRODUODENOSCOPY (EGD) WITH PROPOFOL N/A 03/28/2017   Procedure: ESOPHAGOGASTRODUODENOSCOPY (EGD) WITH PROPOFOL  with balloon dilation of duodenum;  Surgeon: [name], MD;  Location: [facility];  Service: Gastroenterology;  Laterality: N/A;  . FLEXIBLE SIGMOIDOSCOPY    . JOINT REPLACEMENT    . MULTIPLE TOOTH EXTRACTIONS  2005  . SHOULDER SURGERY     right  . VASECTOMY     bilateral w/lysis of penile adhesions    FAMILY HISTORY: Family History  Problem Relation Age of Onset  . Colon polyps Father   . Diabetes Father   . Hypertension Father   . Hypertension Mother   . Asthma Mother   . Heart disease Maternal Grandmother   . Colon cancer Neg Hx     . Rectal cancer Neg Hx   . Stomach cancer Neg Hx   . Esophageal cancer Neg Hx     SOCIAL HISTORY: Social History   Socioeconomic History  . Marital status: Married    Spouse name: Not on file  . Number of children: 3  . Years of education: Some college  . Highest education level: Not on file  Occupational History  . Occupation: [employer]  Social Needs  . Financial resource strain: Not on file  . Food insecurity:    Worry: Not on file    Inability: Not on file  . Transportation needs:    Medical: Not on file    Non-medical: Not on file  Tobacco Use  . Smoking status: Current Every Day Smoker  Packs/day: 0.75    Years: 21.00    Pack years: 15.75    Types: Cigarettes  . Smokeless tobacco: Former Systems developer    Types: Snuff  . Tobacco comment: Counseling sheet given in exam room   Substance and Sexual Activity  . Alcohol use: No    Alcohol/week: 0.0 oz  . Drug use: No  . Sexual activity: Yes    Partners: Female  Lifestyle  . Physical activity:    Days per week: Not on file    Minutes per session: Not on file  . Stress: Not on file  Relationships  . Social connections:    Talks on phone: Not on file    Gets together: Not on file    Attends religious service: Not on file    Active member of club or organization: Not on file    Attends meetings of clubs or organizations: Not on file    Relationship status: Not on file  . Intimate partner violence:    Fear of current or ex partner: Not on file    Emotionally abused: Not on file    Physically abused: Not on file    Forced sexual activity: Not on file  Other Topics Concern  . Not on file  Social History Narrative   Married. Education: [employer].    Lives at home w/ his wife and kids   Right-handed   Caffeine: 3 sodas per day      PHYSICAL EXAM  Vitals:   10/09/17 1453  BP: (!) 150/98  Pulse: 66  Weight: 293 lb (132.9 kg)  Height: 6' (1.829 m)   Body mass index is 39.74 kg/m.  Generalized: Well developed,  in no acute distress   Neurological examination  Mentation: Alert oriented to time, place, history taking. Follows all commands speech and language fluent Cranial nerve II-XII: Pupils were equal round reactive to light. Extraocular movements were full, visual field were full on confrontational test. Facial sensation and strength were normal. Uvula tongue midline. Head turning and shoulder shrug  were normal and symmetric. Motor: The motor testing reveals 5 over 5 strength of all 4 extremities. Good symmetric motor tone is noted throughout.  Sensory: Sensory testing is intact to soft touch on all 4 extremities. No evidence of extinction is noted.  Coordination: Cerebellar testing reveals good finger-nose-finger and heel-to-shin bilaterally.  Gait and station: Gait is normal.  Reflexes: Deep tendon reflexes are symmetric and normal bilaterally.   DIAGNOSTIC DATA (LABS, IMAGING, TESTING) - I reviewed patient records, labs, notes, testing and imaging myself where available.  Lab Results  Component Value Date   WBC 8.7 07/09/2017   HGB 16.3 07/09/2017   HCT 49.3 07/09/2017   MCV 86.5 07/09/2017   PLT 194.0 07/09/2017      Component Value Date/Time   NA 140 03/29/2017 0713   NA 139 01/18/2017 0942   K 4.0 03/29/2017 0713   CL 108 03/29/2017 0713   CO2 25 03/29/2017 0713   GLUCOSE 97 03/29/2017 0713   BUN 10 03/29/2017 0713   BUN 11 01/18/2017 0942   CREATININE 0.97 03/29/2017 0713   CREATININE 1.17 03/10/2016 1826   CALCIUM 8.6 (L) 03/29/2017 0713   PROT 8.1 07/09/2017 1702   PROT 7.1 02/08/2017 1643   ALBUMIN 4.4 07/09/2017 1702   ALBUMIN 4.1 02/08/2017 1643   AST 21 07/09/2017 1702   ALT 21 07/09/2017 1702   ALKPHOS 103 07/09/2017 1702   BILITOT 0.5 07/09/2017 1702   BILITOT 0.4 02/08/2017  [place] 03/29/2017 0713   GFRNONAA 75 03/10/2016 1826   GFRAA >60 03/29/2017 0713   GFRAA 87 03/10/2016 1826   Lab Results  Component Value Date   CHOL 160 01/18/2017    HDL 34 (L) 01/18/2017   LDLCALC 99 01/18/2017   TRIG 133 01/18/2017   CHOLHDL 4.7 01/18/2017       ASSESSMENT AND PLAN 47 y.o. year old male  has a past medical history of Allergy, Anxiety, Biliary dyskinesia, Colloid thyroid nodule, Crohn's disease ([place]), Gastric ulcer, GERD (gastroesophageal reflux disease), History of migraine headaches, HTN (hypertension), Kidney stone (09/20/2012), Migraine, Obesity, and Pneumonia (02/27/2014). here with:  1.  Migraine headaches  Overall the patient is doing well.  He will continue to use Maxalt and Flexeril to treat his acute headaches.  The patient reports that he does not need a refill of Maxalt.  A refill for Flexeril was sent in.  Patient advised that if his primary care is amenable to taking over the prescription he can follow-up with his PCP and follow-up with our office on an as-needed basis.  Patient voiced understanding and states that he will discuss this with his PCP at the next visit.  Otherwise he will follow-up in our office in 1 year or sooner if needed.  I spent 15 minutes with the patient. 50% of this time was spent his plan of care as well as medication management.   Ward Givens, MSN, NP-C 10/09/2017, 3:01 PM [facility] [address] [place], [place] [address] [phone]

## 2017-10-09 NOTE — Progress Notes (Signed)
I have read the note, and I agree with the clinical assessment and plan.  Kolbey Teichert K Maxx Calaway   

## 2017-10-09 NOTE — Patient Instructions (Signed)
Your Plan:  Continue Flexeril and Maxalt as needed for migraines If your symptoms worsen or you develop new symptoms please let us know.    Thank you for coming to see [place] at [facility]. I hope we have been able to provide you high quality care today.  You may receive a patient satisfaction survey over the next few weeks. We would appreciate your feedback and comments so that we may continue to improve ourselves and the health of our patients.

## 2017-10-13 ENCOUNTER — Ambulatory Visit: Payer: Managed Care, Other (non HMO) | Admitting: Family Medicine

## 2017-10-31 ENCOUNTER — Ambulatory Visit (INDEPENDENT_AMBULATORY_CARE_PROVIDER_SITE_OTHER): Payer: Managed Care, Other (non HMO) | Admitting: Physician Assistant

## 2017-10-31 ENCOUNTER — Other Ambulatory Visit: Payer: Self-pay

## 2017-10-31 ENCOUNTER — Encounter: Payer: Self-pay | Admitting: Physician Assistant

## 2017-10-31 ENCOUNTER — Ambulatory Visit (INDEPENDENT_AMBULATORY_CARE_PROVIDER_SITE_OTHER): Payer: Managed Care, Other (non HMO)

## 2017-10-31 VITALS — BP 140/100 | HR 79 | Temp 98.7°F | Resp 18 | Ht 72.52 in | Wt 285.4 lb

## 2017-10-31 DIAGNOSIS — J309 Allergic rhinitis, unspecified: Secondary | ICD-10-CM | POA: Diagnosis not present

## 2017-10-31 DIAGNOSIS — J209 Acute bronchitis, unspecified: Secondary | ICD-10-CM

## 2017-10-31 DIAGNOSIS — R05 Cough: Secondary | ICD-10-CM

## 2017-10-31 DIAGNOSIS — R059 Cough, unspecified: Secondary | ICD-10-CM

## 2017-10-31 DIAGNOSIS — I10 Essential (primary) hypertension: Secondary | ICD-10-CM

## 2017-10-31 LAB — POCT CBC
Granulocyte percent: 35.8 %G — AB (ref 37–80)
HEMATOCRIT: 46.6 % (ref 43.5–53.7)
HEMOGLOBIN: 15.4 g/dL (ref 14.1–18.1)
Lymph, poc: 1.6 (ref 0.6–3.4)
MCH, POC: 28.4 pg (ref 27–31.2)
MCHC: 33.1 g/dL (ref 31.8–35.4)
MCV: 86 fL (ref 80–97)
MID (cbc): 0.7 (ref 0–0.9)
MPV: 9 fL (ref 0–99.8)
POC GRANULOCYTE: 1.3 — AB (ref 2–6.9)
POC LYMPH PERCENT: 45.4 %L (ref 10–50)
POC MID %: 18.8 % — AB (ref 0–12)
Platelet Count, POC: 172 10*3/uL (ref 142–424)
RBC: 5.42 M/uL (ref 4.69–6.13)
RDW, POC: 13.8 %
WBC: 3.6 10*3/uL — AB (ref 4.6–10.2)

## 2017-10-31 LAB — POC INFLUENZA A&B (BINAX/QUICKVUE)
Influenza A, POC: NEGATIVE
Influenza B, POC: NEGATIVE

## 2017-10-31 MED ORDER — GUAIFENESIN ER 1200 MG PO TB12: 1.0000 | ORAL_TABLET | Freq: Two times a day (BID) | ORAL | 1 refills | Status: DC | PRN

## 2017-10-31 MED ORDER — BENZONATATE 100 MG PO CAPS: 100.0000 mg | ORAL_CAPSULE | Freq: Three times a day (TID) | ORAL | 0 refills | Status: DC | PRN

## 2017-10-31 MED ORDER — HYDROCODONE-HOMATROPINE 5-1.5 MG/5ML PO SYRP: 5.0000 mL | ORAL_SOLUTION | Freq: Three times a day (TID) | ORAL | 0 refills | Status: DC | PRN

## 2017-10-31 MED ORDER — AZELASTINE HCL 0.1 % NA SOLN: 2.0000 | Freq: Two times a day (BID) | NASAL | 0 refills | Status: DC

## 2017-10-31 NOTE — Progress Notes (Signed)
MRN: [id number] DOB: 03-Mar-1971  Subjective:   Craig Guzman is a 47 y.o. male with PMH Chron's and HTN presenting for chief complaint of Cough ( x 3 weeks) and Generalized Body Aches (X 2 days- pt states with heasdache and fever) .  Reports 3 week history of illness. Has been having productive cough, nasal congestion, headache, fever, ear ache, and sore throat. Started feeling better but then felt worse, especially in terms of drainage. Cough has improved. Was seen at [facility] 2 weeks ago and was tx with prednisone and antihistamine for viral illness.  Has tried prednisone and corcidin with no full relief. Denies ear pain, shortness of breath, dyspnea on exertion, wheezing, chest pain, heart palpitations, headache, visual disturbance,  nausea, vomiting, abdominal pain and diarrhea. Has not had sick contact with anyone. Has history of seasonal allergies, not taking anything daily.  No history of asthma or COPD. Patient has not had flu shot this season. Smokes 1 ppd. No alcohol. Denies any other aggravating or relieving factors, no other questions or concerns.  ROS per HPI Craig Guzman has a current medication list which includes the following prescription(s): cyclobenzaprine, doxazosin, lisinopril, mercaptopurine, metoclopramide, omeprazole-sodium bicarbonate, ondansetron, prochlorperazine, rizatriptan, ustekinumab, verapamil, azelastine, benzonatate, guaifenesin, and hydrocodone-homatropine, and the following Facility-Administered Medications: sodium chloride and ustekinumab (STELARA) 520 mg in sodium chloride 0.9 % 250 mL IVPB. Also is allergic to zithromax [azithromycin].  Craig Guzman  has a past medical history of Allergy, Anxiety, Biliary dyskinesia, Colloid thyroid nodule, Crohn's disease ([place]), Gastric ulcer, GERD (gastroesophageal reflux disease), History of migraine headaches, HTN (hypertension), Kidney stone (09/20/2012), Migraine, Obesity, and Pneumonia (02/27/2014). Also  has a past surgical history that  includes Cholecystectomy (2011); Vasectomy; Esophagogastroduodenoscopy (05/04/11, 07/26/11); Multiple tooth extractions (2005); Colonoscopy (07/26/11); Shoulder surgery; Joint replacement; Flexible sigmoidoscopy; and Esophagogastroduodenoscopy (egd) with propofol (N/A, 03/28/2017).   Objective:   Vitals: BP (!) 140/100 (BP Location: Left Arm, Cuff Size: Large)   Pulse 79   Temp 98.7 F (37.1 C) (Oral)   Resp 18   Ht 6' 0.52" (1.842 m)   Wt 285 lb 6.4 oz (129.5 kg)   SpO2 96%   BMI 38.15 kg/m   Physical Exam  Constitutional: He is oriented to person, place, and time. He appears well-developed and well-nourished.  Appears like he does not feel well.   HENT:  Head: Normocephalic and atraumatic.  Right Ear: External ear and ear canal normal. Tympanic membrane is not erythematous and not bulging. A middle ear effusion is present.  Left Ear: External ear and ear canal normal. Tympanic membrane is not erythematous and not bulging. A middle ear effusion is present.  Nose: Mucosal edema (moderate bilaterally) present. No rhinorrhea. Right sinus exhibits no maxillary sinus tenderness and no frontal sinus tenderness. Left sinus exhibits no maxillary sinus tenderness and no frontal sinus tenderness.  Mouth/Throat: Uvula is midline and mucous membranes are normal. Posterior oropharyngeal erythema present. No posterior oropharyngeal edema or tonsillar abscesses. Tonsils are 1+ on the right. Tonsils are 1+ on the left. No tonsillar exudate.  Eyes: Conjunctivae are normal. Right eye exhibits discharge (watery). Left eye exhibits discharge (watery).  Neck: Normal range of motion.  Pulmonary/Chest: Effort normal. He has no decreased breath sounds. He has no wheezes. He has no rhonchi. He has no rales.  Few coarse breath sounds auscultated in bilateral lung fields  Lymphadenopathy:       Head (right side): No submental, no submandibular, no tonsillar, no preauricular, no posterior auricular and no occipital  adenopathy present.       Head (left side): No submental, no submandibular, no tonsillar, no preauricular, no posterior auricular and no occipital adenopathy present.    He has no cervical adenopathy.       Right: No supraclavicular adenopathy present.       Left: No supraclavicular adenopathy present.  Neurological: He is alert and oriented to person, place, and time.  Skin: Skin is warm and dry.  Psychiatric: He has a normal mood and affect.  Vitals reviewed.  Results for orders placed or performed in visit on 10/31/17 (from the past 72 hour(s))  POCT CBC     Status: Abnormal   Collection Time: 10/31/17 12:14 PM  Result Value Ref Range   WBC 3.6 (A) 4.6 - 10.2 K/uL   Lymph, poc 1.6 0.6 - 3.4   POC LYMPH PERCENT 45.4 10 - 50 %L   MID (cbc) 0.7 0 - 0.9   POC MID % 18.8 (A) 0 - 12 %M   POC Granulocyte 1.3 (A) 2 - 6.9   Granulocyte percent 35.8 (A) 37 - 80 %G   RBC 5.42 4.69 - 6.13 M/uL   Hemoglobin 15.4 14.1 - 18.1 g/dL   HCT, POC 46.6 43.5 - 53.7 %   MCV 86.0 80 - 97 fL   MCH, POC 28.4 27 - 31.2 pg   MCHC 33.1 31.8 - 35.4 g/dL   RDW, POC 13.8 %   Platelet Count, POC 172 142 - 424 K/uL   MPV 9.0 0 - 99.8 fL  POC Influenza A&B(BINAX/QUICKVUE)     Status: None   Collection Time: 10/31/17 12:15 PM  Result Value Ref Range   Influenza A, POC Negative Negative   Influenza B, POC Negative Negative   Dg Chest 2 View  Result Date: 10/31/2017 CLINICAL DATA:  Productive cough for 3 weeks.  Fever. EXAM: CHEST - 2 VIEW COMPARISON:  03/27/2017. FINDINGS: Trachea is midline. Heart size stable. Thoracic aorta is calcified. Lungs are clear. No pleural fluid. IMPRESSION: No acute findings. Electronically Signed   By: Lorin Picket M.D.   On: 10/31/2017 12:42     BP Readings from Last 3 Encounters:  10/31/17 (!) 140/100  10/09/17 (!) 150/98  10/02/17 124/80    Assessment and Plan :  1. Acute bronchitis, unspecified organism - Likely viral in etiology d/t reassuring physical exam  findings and labs. - Advised supportive care, offered symptomatic relief. - Return to clinic if symptoms worsen or fail to improve in 5-7 days, otherwise return to clinic as needed. - benzonatate (TESSALON) 100 MG capsule; Take 1-2 capsules (100-200 mg total) by mouth 3 (three) times daily as needed for cough.  Dispense: 40 capsule; Refill: 0 - Guaifenesin (MUCINEX MAXIMUM STRENGTH) 1200 MG TB12; Take 1 tablet (1,200 mg total) by mouth every 12 (twelve) hours as needed.  Dispense: 14 tablet; Refill: 1 - HYDROcodone-homatropine (HYCODAN) 5-1.5 MG/5ML syrup; Take 5 mLs by mouth every 8 (eight) hours as needed for cough.  Dispense: 75 mL; Refill: 0  2. Cough - POCT CBC - POC Influenza A&B(BINAX/QUICKVUE) - DG Chest 2 View; Future  3. Allergic rhinitis, unspecified seasonality, unspecified trigger Suspect untreated allergies are also playing a role in pt's sx. Recommended taking Zyrtec daily and using Astelin nasal spray. - azelastine (ASTELIN) 0.1 % nasal spray; Place 2 sprays into both nostrils 2 (two) times daily. Use in each nostril as directed  Dispense: 30 mL; Refill: 0  4. Essential hypertension Uncontrolled in office. Likely related  to illness. He is otherwise, asymptomatic. Instructed to check bp outside of office over the next couple of weeks. Return if consistently >140/90. Given strict ED precautions.    Tenna Delaine, PA-C  Primary [facility] at [facility] 11/02/2017 12:59 PM

## 2017-10-31 NOTE — Patient Instructions (Addendum)
- We will treat this as a respiratory viral infection.  - I recommend you rest, drink plenty of fluids, eat light meals including soups.  - I recommend continuing with zyrtec daily and also starting astelin nasal spray daily. - You may use cough syrup at night for your cough and sore throat, Tessalon pearls during the day to stop cough and mucinex to help bring it up. Marland Kitchen Be aware that cough syrup can definitely make you drowsy and sleepy so do not drive or operate any heavy machinery if it is affecting you during the day.  - You may also use Tylenol or ibuprofen over-the-counter for your sore throat. Tea recipe for sore throat: boil water, add 2 inches shaved ginger root, recommended to start taking Zyrtec daily and use Afrin nasal spray.  Steep 15 minutes, add juice from 2 full lemons, and 2 tbsp honey. - Please let me know if you are not seeing any improvement or get worse in 5-7 days.  In terms of elevated blood pressure, this is likely due to coughing and illness.  I would like you to check your blood pressure at least a couple times over the next week outside of the office and document these values. It is best if you check the blood pressure at different times in the day. Your goal is <140/90. If your values are consistently above this goal, please return to office for further evaluation. If you start to have chest pain, blurred vision, shortness of breath, severe headache, lower leg swelling, or nausea/vomiting please seek care immediately here or at the ED.     Acute Bronchitis, Adult Acute bronchitis is when air tubes (bronchi) in the lungs suddenly get swollen. The condition can make it hard to breathe. It can also cause these symptoms:  A cough.  Coughing up clear, yellow, or green mucus.  Wheezing.  Chest congestion.  Shortness of breath.  A fever.  Body aches.  Chills.  A sore throat.  Follow these instructions at home: Medicines  Take over-the-counter and prescription  medicines only as told by your doctor.  If you were prescribed an antibiotic medicine, take it as told by your doctor. Do not stop taking the antibiotic even if you start to feel better. General instructions  Rest.  Drink enough fluids to keep your pee (urine) clear or pale yellow.  Avoid smoking and secondhand smoke. If you smoke and you need help quitting, ask your doctor. Quitting will help your lungs heal faster.  Use an inhaler, cool mist vaporizer, or humidifier as told by your doctor.  Keep all follow-up visits as told by your doctor. This is important. How is this prevented? To lower your risk of getting this condition again:  Wash your hands often with soap and water. If you cannot use soap and water, use hand sanitizer.  Avoid contact with people who have cold symptoms.  Try not to touch your hands to your mouth, nose, or eyes.  Make sure to get the flu shot every year.  Contact a doctor if:  Your symptoms do not get better in 2 weeks. Get help right away if:  You cough up blood.  You have chest pain.  You have very bad shortness of breath.  You become dehydrated.  You faint (pass out) or keep feeling like you are going to pass out.  You keep throwing up (vomiting).  You have a very bad headache.  Your fever or chills gets worse. This information is not  intended to replace advice given to you by your health care provider. Make sure you discuss any questions you have with your health care provider. Document Released: 12/06/2007 Document Revised: 01/26/2016 Document Reviewed: 12/08/2015 Elsevier Interactive Patient Education  2018 [employer].    IF you received an x-ray today, you will receive an invoice from [facility]. Please contact [facility] at [phone] with questions or concerns regarding your invoice.   IF you received labwork today, you will receive an invoice from [facility]. Please contact [facility] at [phone] with  questions or concerns regarding your invoice.   Our billing staff will not be able to assist you with questions regarding bills from these companies.  You will be contacted with the lab results as soon as they are available. The fastest way to get your results is to activate your My Chart account. Instructions are located on the last page of this paperwork. If you have not heard from [place] regarding the results in 2 weeks, please contact this office.

## 2017-11-02 ENCOUNTER — Encounter: Payer: Self-pay | Admitting: Physician Assistant

## 2017-11-05 ENCOUNTER — Encounter: Payer: Self-pay | Admitting: Physician Assistant

## 2017-11-08 ENCOUNTER — Encounter: Payer: Self-pay | Admitting: Family Medicine

## 2017-11-08 ENCOUNTER — Other Ambulatory Visit: Payer: Self-pay

## 2017-11-08 ENCOUNTER — Ambulatory Visit (INDEPENDENT_AMBULATORY_CARE_PROVIDER_SITE_OTHER): Payer: Managed Care, Other (non HMO) | Admitting: Family Medicine

## 2017-11-08 DIAGNOSIS — R05 Cough: Secondary | ICD-10-CM

## 2017-11-08 DIAGNOSIS — I10 Essential (primary) hypertension: Secondary | ICD-10-CM

## 2017-11-08 DIAGNOSIS — R0982 Postnasal drip: Secondary | ICD-10-CM

## 2017-11-08 DIAGNOSIS — R059 Cough, unspecified: Secondary | ICD-10-CM

## 2017-11-08 DIAGNOSIS — R351 Nocturia: Secondary | ICD-10-CM

## 2017-11-08 MED ORDER — DOXAZOSIN MESYLATE 4 MG PO TABS: 4.0000 mg | ORAL_TABLET | Freq: Every day | ORAL | 1 refills | Status: DC

## 2017-11-08 MED ORDER — VERAPAMIL HCL ER 180 MG PO TBCR: 360.0000 mg | EXTENDED_RELEASE_TABLET | Freq: Every day | ORAL | 1 refills | Status: DC

## 2017-11-08 MED ORDER — LISINOPRIL 40 MG PO TABS: 40.0000 mg | ORAL_TABLET | Freq: Every day | ORAL | 1 refills | Status: DC

## 2017-11-08 NOTE — Progress Notes (Signed)
I,Craig Guzman,acting as a scribe for Craig Agreste, MD.,have documented all relevant documentation on the behalf of Craig Agreste, MD,as directed by  Craig Agreste, MD while in the presence of Craig Agreste, MD. 11/08/2017 3:30pm Subjective:    Patient ID: Craig Guzman, male    DOB: June 05, 1971, 47 y.o.   MRN: [id number]  Chief Complaint  Patient presents with  . Hypertension    1 week f/u   . medication check    following new meds and refills    HPI Benjamim Guzman is a 47 y.o. male who presents to Primary Care at [facility] for follow up of:  HTN -He was seen 8 days ago for acute illness -BP 140/100 -Lowest at home that he reports is 140/92 BP Readings from Last 3 Encounters:  11/08/17 (!) 138/92  10/31/17 (!) 140/100  10/09/17 (!) 150/98   Lab Results  Component Value Date   CREATININE 0.97 03/29/2017   Obstructive Sleep Apnea -Had a Cpap but wasn't using since last visit due to fit issues -AHI- 7.9 on home sleep test Dec. 2017 -He last f/u with specialist 6 months ago. [name]  Tobacco Abuse -He did not tolerate Chantix or patches. -resources given last visit for smoking cessation  Nocturia -Discussed in August 2018 -Doxazosin to even dosing -Reccomended against large amount of fluids before bedtime -Discussed sleep apnea treatment _-He has night time urinary frequency- he denies seeing urology -minimum 3 times per night.  Prostate test Last 11/06/16 PSA that was 0.3  Lab Results  Component Value Date   PSA 0.2 03/25/2016   PSA 0.20 09/19/2011   PSA 0.21 09/01/2011   No trouble with urination during the day- only when he lays down at night  -He saw Dr.Grapey urologist 7-8 years ago  Obesity Wt Readings from Last 3 Encounters:  11/08/17 288 lb 3.2 oz (130.7 kg)  10/31/17 285 lb 6.4 oz (129.5 kg)  10/09/17 293 lb (132.9 kg)   Body mass index is 39.09 kg/m.  -Recommended to decrease fast food and soda intake -He is now eating less fast  food. He denies having recent alcohol consumption.    Crohn's Disease with history of obstruction. - followed by [name] -last office reviewed from April 2nd -Recurrent issues with Stricture which has caused intermittent vomiting. -He is now on Stelara with no complications. He denies having light-headedness or dizziness.   Acute Bronchitis -He was given Tesslon cough medicine -He felt jittery while combining this with hydrocodone after bedtime -He takes mucinex - 1 pill every 8 hrs.  -His has less coughing but issues with post nasal drainage. -He takes Astelin- he can't sniff because it goes down the back of his throat and upsets him stomach. He was advised on correctly administering the nasal spray  He takes Zyrtec allergy medicine.   Patient Active Problem List   Diagnosis Date Noted  . Crohn's disease of small intestine with other complication ([place]) 60/04/9322  . Duodenal stricture   . Intractable vomiting with nausea   . Cigarette nicotine dependence without complication 55/73/2202  . Shifting sleep-work schedule, affecting sleep 05/15/2016  . Sleep deprivation 05/15/2016  . Sleep related hypoventilation in conditions classified elsewhere 05/15/2016  . Sleep related headaches 05/15/2016  . Morbid obesity ([place]) 09/19/2013  . Diarrhea 07/10/2013  . 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 Hypertension 11/26/2012  . Gastroparesis??? 12/08/2011  . Common migraine without aura 12/08/2011  . Long-term use of immunosuppressant medication 08/08/2011  .  Crohn's disease of stomach and colon-suspected 05/17/2011  . GERD (gastroesophageal reflux disease) 04/21/2011   Past Medical History:  Diagnosis Date  . Allergy    trees/ grasses/ animals/dust/mold  . Anxiety   . Biliary dyskinesia   . Colloid thyroid nodule   . Crohn's disease ([place])    Stomach, terminal ileum, cecum  . Gastric ulcer    antral  . GERD (gastroesophageal reflux disease)   . History of migraine headaches     . HTN (hypertension)   . Kidney stone 09/20/2012  . Migraine   . Obesity   . Pneumonia 02/27/2014   ED   Past Surgical History:  Procedure Laterality Date  . CHOLECYSTECTOMY  2011   [place]  . COLONOSCOPY  07/26/11   Crohn's colitis, ileitis  . ESOPHAGOGASTRODUODENOSCOPY  05/04/11, 07/26/11   granulomatous gastritis - Crohn's  . ESOPHAGOGASTRODUODENOSCOPY (EGD) WITH PROPOFOL N/A 03/28/2017   Procedure: ESOPHAGOGASTRODUODENOSCOPY (EGD) WITH PROPOFOL  with balloon dilation of duodenum;  Surgeon: [name], MD;  Location: [facility]OSCOPY;  Service: Gastroenterology;  Laterality: N/A;  . FLEXIBLE SIGMOIDOSCOPY    . JOINT REPLACEMENT    . MULTIPLE TOOTH EXTRACTIONS  2005  . SHOULDER SURGERY     right  . VASECTOMY     bilateral w/lysis of penile adhesions   Allergies  Allergen Reactions  . Zithromax [Azithromycin] Hypertension   Prior to Admission medications   Medication Sig Start Date End Date Taking? Authorizing Provider  azelastine (ASTELIN) 0.1 % nasal spray Place 2 sprays into both nostrils 2 (two) times daily. Use in each nostril as directed 10/31/17  Yes Timmothy Euler, [place] D, PA-C  benzonatate (TESSALON) 100 MG capsule Take 1-2 capsules (100-200 mg total) by mouth 3 (three) times daily as needed for cough. 10/31/17  Yes Timmothy Euler, [place] D, PA-C  cyclobenzaprine (FLEXERIL) 10 MG tablet Take 1 tablet (10 mg total) by mouth 3 (three) times daily as needed (for migraines). 10/09/17  Yes Ward Givens, NP  doxazosin (CARDURA) 2 MG tablet Take 1 tablet (2 mg total) by mouth daily. 01/13/17  Yes Craig Agreste, MD  Guaifenesin [facility] MAXIMUM STRENGTH) 1200 MG TB12 Take 1 tablet (1,200 mg total) by mouth every 12 (twelve) hours as needed. 10/31/17  Yes Timmothy Euler, [place] D, PA-C  HYDROcodone-homatropine (HYCODAN) 5-1.5 MG/5ML syrup Take 5 mLs by mouth every 8 (eight) hours as needed for cough. 10/31/17  Yes Timmothy Euler, [place] D, PA-C  lisinopril (PRINIVIL,ZESTRIL) 40 MG tablet TAKE 1  TABLET (40 MG TOTAL) BY MOUTH DAILY. OFFICE VISIT NEEDED FOR 48 DAY SUPPLY 08/27/17  Yes Craig Agreste, MD  mercaptopurine (PURINETHOL) 50 MG tablet Take 1 tablet (50 mg total) by mouth daily. Give on an empty stomach 1 hour before or 2 hours after meals. 05/02/17  Yes Gatha Mayer, MD  metoCLOPramide (REGLAN) 10 MG tablet Take 1 tablet (10 mg total) by mouth 3 (three) times daily before meals. 09/06/17 12/05/17 Yes Lemmon, Lavone Nian, PA  omeprazole-sodium bicarbonate (ZEGERID) 40-1100 MG capsule TAKE 1 CAPSULE BY MOUTH AT BEDTIME. 04/25/17  Yes Craig Agreste, MD  ondansetron (ZOFRAN-ODT) 4 MG disintegrating tablet Take 1 tablet (4 mg total) by mouth every 8 (eight) hours as needed for nausea or vomiting. 03/19/17  Yes Gatha Mayer, MD  prochlorperazine (COMPAZINE) 25 MG suppository Place 1 suppository (25 mg total) rectally every 12 (twelve) hours as needed for nausea or vomiting. 03/19/17  Yes Gatha Mayer, MD  rizatriptan (MAXALT-MLT) 10 MG disintegrating tablet TAKE 1 TABLET (  10 MG TOTAL) BY MOUTH 3 (THREE) TIMES DAILY AS NEEDED FOR MIGRAINE. 05/09/17  Yes Kathrynn Ducking, MD  ustekinumab (STELARA) 90 MG/ML SOSY injection Inject 1 mL (90 mg total) into the skin every 8 (eight) weeks. 07/25/17  Yes Gatha Mayer, MD  verapamil (CALAN-SR) 180 MG CR tablet TAKE 2 TABLETS (360 MG TOTAL) BY MOUTH DAILY. 04/16/17  Yes Craig Agreste, MD   Social History   Socioeconomic History  . Marital status: Married    Spouse name: Not on file  . Number of children: 3  . Years of education: Some college  . Highest education level: Not on file  Occupational History  . Occupation: [employer]  Social Needs  . Financial resource strain: Not on file  . Food insecurity:    Worry: Not on file    Inability: Not on file  . Transportation needs:    Medical: Not on file    Non-medical: Not on file  Tobacco Use  . Smoking status: Current Every Day Smoker    Packs/day: 0.75    Years: 21.00     Pack years: 15.75    Types: Cigarettes  . Smokeless tobacco: Former Systems developer    Types: Snuff  . Tobacco comment: Counseling sheet given in exam room   Substance and Sexual Activity  . Alcohol use: Yes    Alcohol/week: 0.0 oz    Comment: occ  . Drug use: No  . Sexual activity: Yes    Partners: Female  Lifestyle  . Physical activity:    Days per week: Not on file    Minutes per session: Not on file  . Stress: Not on file  Relationships  . Social connections:    Talks on phone: Not on file    Gets together: Not on file    Attends religious service: Not on file    Active member of club or organization: Not on file    Attends meetings of clubs or organizations: Not on file    Relationship status: Not on file  . Intimate partner violence:    Fear of current or ex partner: Not on file    Emotionally abused: Not on file    Physically abused: Not on file    Forced sexual activity: Not on file  Other Topics Concern  . Not on file  Social History Narrative   Married. Education: [employer].    Lives at home w/ his wife and kids   Right-handed   Caffeine: 3 sodas per day     Review of Systems  Constitutional: Negative for fatigue and unexpected weight change.  HENT: Positive for congestion, postnasal drip and rhinorrhea.   Eyes: Negative for visual disturbance.  Respiratory: Negative for cough, chest tightness and shortness of breath.   Cardiovascular: Negative for chest pain, palpitations and leg swelling.  Gastrointestinal: Negative for abdominal pain and blood in stool.  Neurological: Negative for dizziness, light-headedness and headaches.       Objective:   Physical Exam  Constitutional: He is oriented to person, place, and time. He appears well-developed and well-nourished.  HENT:  Head: Normocephalic and atraumatic.  Right Ear: Tympanic membrane, external ear and ear canal normal.  Left Ear: Tympanic membrane, external ear and ear canal normal.  Nose: No rhinorrhea.    Mouth/Throat: Oropharynx is clear and moist and mucous membranes are normal. No oropharyngeal exudate or posterior oropharyngeal erythema.  Eyes: Pupils are equal, round, and reactive to light. Conjunctivae and EOM are normal.  Neck: Neck supple. No JVD present. Carotid bruit is not present.  Cardiovascular: Normal rate, regular rhythm, normal heart sounds and intact distal pulses.  No murmur heard. Pulmonary/Chest: Effort normal and breath sounds normal. He has no wheezes. He has no rhonchi. He has no rales.  Abdominal: Soft. There is no tenderness.  Musculoskeletal: He exhibits no edema.  Lymphadenopathy:    He has no cervical adenopathy.  Neurological: He is alert and oriented to person, place, and time.  Skin: Skin is warm and dry. No rash noted.  Psychiatric: He has a normal mood and affect. His behavior is normal.  Vitals reviewed.     Assessment & Plan:   Ison Wichmann is a 47 y.o. male Cough PND (post-nasal drip)  - post infectious possible, but likely postnasal drip.  Correct technique discussed as may tolerate better. Recheck in 4 weeks. Avoid tessalon given side effects at this time  Essential hypertension - Plan: verapamil (CALAN-SR) 180 MG CR tablet, lisinopril (PRINIVIL,ZESTRIL) 40 MG tablet, doxazosin (CARDURA) 4 MG tablet, Basic metabolic panel  - decreased control - will increase cardura to 57m, potential side effects discussed. Continue other meds same doses. Recheck 4 weeks.   Nocturia - Plan: PSA, doxazosin (CARDURA) 4 MG tablet, Basic metabolic panel  - check BMP, repeat PSA. trial of Cardura higher dose. Sleep apnea treatment discussed - plans on calling sleep specialist.    Meds ordered this encounter  Medications  . verapamil (CALAN-SR) 180 MG CR tablet    Sig: Take 2 tablets (360 mg total) by mouth daily.    Dispense:  180 tablet    Refill:  1  . lisinopril (PRINIVIL,ZESTRIL) 40 MG tablet    Sig: Take 1 tablet (40 mg total) by mouth daily. Office visit  needed for 90 day supply    Dispense:  90 tablet    Refill:  1  . doxazosin (CARDURA) 4 MG tablet    Sig: Take 1 tablet (4 mg total) by mouth daily.    Dispense:  90 tablet    Refill:  1   Patient Instructions    For blood pressure can increase doxazosin to 4 mg/day.  No change in other two blood pressure meds. Watch for lightheadedness or dizziness on that dose of medication, especially when first standing up.  Try taking doxazosin in the evening as it may also help the urinary symptoms.  I will check prostate test again, and follow-up in 3 to 4 weeks to determine if urology evaluation needed for urinary symptoms as well as repeat blood pressure testing at that time.  Treating sleep apnea may help blood pressure and possibly nighttime urination - call sleep specialist to discuss equipment concerns or possible dental device based on their last notes.   Okay to avoid the Tessalon Perles at this time, Mucinex is okay to use if that is helping with your cough.  Try the nasal spray with the technique we discussed with head forward pinching nostril and avoid sniffing that medication to lessen chance of it going down the back of your throat.  Thank you for coming in today, follow-up in 4 weeks, sooner if any worsening symptoms.    Cough, Adult Coughing is a reflex that clears your throat and your airways. Coughing helps to heal and protect your lungs. It is normal to cough occasionally, but a cough that happens with other symptoms or lasts a long time may be a sign of a condition that needs treatment. A cough may last  only 2-3 weeks (acute), or it may last longer than 8 weeks (chronic). What are the causes? Coughing is commonly caused by:  Breathing in substances that irritate your lungs.  A viral or bacterial respiratory infection.  Allergies.  Asthma.  Postnasal drip.  Smoking.  Acid backing up from the stomach into the esophagus (gastroesophageal reflux).  Certain  medicines.  Chronic lung problems, including COPD (or rarely, lung cancer).  Other medical conditions such as heart failure.  Follow these instructions at home: Pay attention to any changes in your symptoms. Take these actions to help with your discomfort:  Take medicines only as told by your health care provider. ? If you were prescribed an antibiotic medicine, take it as told by your health care provider. Do not stop taking the antibiotic even if you start to feel better. ? Talk with your health care provider before you take a cough suppressant medicine.  Drink enough fluid to keep your urine clear or pale yellow.  If the air is dry, use a cold steam vaporizer or humidifier in your bedroom or your home to help loosen secretions.  Avoid anything that causes you to cough at work or at home.  If your cough is worse at night, try sleeping in a semi-upright position.  Avoid cigarette smoke. If you smoke, quit smoking. If you need help quitting, ask your health care provider.  Avoid caffeine.  Avoid alcohol.  Rest as needed.  Contact a health care provider if:  You have new symptoms.  You cough up pus.  Your cough does not get better after 2-3 weeks, or your cough gets worse.  You cannot control your cough with suppressant medicines and you are losing sleep.  You develop pain that is getting worse or pain that is not controlled with pain medicines.  You have a fever.  You have unexplained weight loss.  You have night sweats. Get help right away if:  You cough up blood.  You have difficulty breathing.  Your heartbeat is very fast. This information is not intended to replace advice given to you by your health care provider. Make sure you discuss any questions you have with your health care provider. Document Released: 12/16/2010 Document Revised: 11/25/2015 Document Reviewed: 08/26/2014 Elsevier Interactive Patient Education  2018 [employer].    IF you received  an x-ray today, you will receive an invoice from [facility]. Please contact [facility] at [phone] with questions or concerns regarding your invoice.   IF you received labwork today, you will receive an invoice from [place]. Please contact [facility] at [phone] with questions or concerns regarding your invoice.   Our billing staff will not be able to assist you with questions regarding bills from these companies.  You will be contacted with the lab results as soon as they are available. The fastest way to get your results is to activate your My Chart account. Instructions are located on the last page of this paperwork. If you have not heard from [place] regarding the results in 2 weeks, please contact this office.       I personally performed the services described in this documentation, which was scribed in my presence. The recorded information has been reviewed and considered for accuracy and completeness, addended by me as needed, and agree with information above.  Signed,   Merri Ray, MD Primary Care at [place].  11/10/17 6:22 PM

## 2017-11-08 NOTE — Patient Instructions (Addendum)
For blood pressure can increase doxazosin to 4 mg/day.  No change in other two blood pressure meds. Watch for lightheadedness or dizziness on that dose of medication, especially when first standing up.  Try taking doxazosin in the evening as it may also help the urinary symptoms.  I will check prostate test again, and follow-up in 3 to 4 weeks to determine if urology evaluation needed for urinary symptoms as well as repeat blood pressure testing at that time.  Treating sleep apnea may help blood pressure and possibly nighttime urination - call sleep specialist to discuss equipment concerns or possible dental device based on their last notes.   Okay to avoid the Tessalon Perles at this time, Mucinex is okay to use if that is helping with your cough.  Try the nasal spray with the technique we discussed with head forward pinching nostril and avoid sniffing that medication to lessen chance of it going down the back of your throat.  Thank you for coming in today, follow-up in 4 weeks, sooner if any worsening symptoms.    Cough, Adult Coughing is a reflex that clears your throat and your airways. Coughing helps to heal and protect your lungs. It is normal to cough occasionally, but a cough that happens with other symptoms or lasts a long time may be a sign of a condition that needs treatment. A cough may last only 2-3 weeks (acute), or it may last longer than 8 weeks (chronic). What are the causes? Coughing is commonly caused by:  Breathing in substances that irritate your lungs.  A viral or bacterial respiratory infection.  Allergies.  Asthma.  Postnasal drip.  Smoking.  Acid backing up from the stomach into the esophagus (gastroesophageal reflux).  Certain medicines.  Chronic lung problems, including COPD (or rarely, lung cancer).  Other medical conditions such as heart failure.  Follow these instructions at home: Pay attention to any changes in your symptoms. Take these actions to  help with your discomfort:  Take medicines only as told by your health care provider. ? If you were prescribed an antibiotic medicine, take it as told by your health care provider. Do not stop taking the antibiotic even if you start to feel better. ? Talk with your health care provider before you take a cough suppressant medicine.  Drink enough fluid to keep your urine clear or pale yellow.  If the air is dry, use a cold steam vaporizer or humidifier in your bedroom or your home to help loosen secretions.  Avoid anything that causes you to cough at work or at home.  If your cough is worse at night, try sleeping in a semi-upright position.  Avoid cigarette smoke. If you smoke, quit smoking. If you need help quitting, ask your health care provider.  Avoid caffeine.  Avoid alcohol.  Rest as needed.  Contact a health care provider if:  You have new symptoms.  You cough up pus.  Your cough does not get better after 2-3 weeks, or your cough gets worse.  You cannot control your cough with suppressant medicines and you are losing sleep.  You develop pain that is getting worse or pain that is not controlled with pain medicines.  You have a fever.  You have unexplained weight loss.  You have night sweats. Get help right away if:  You cough up blood.  You have difficulty breathing.  Your heartbeat is very fast. This information is not intended to replace advice given to you by your health  care provider. Make sure you discuss any questions you have with your health care provider. Document Released: 12/16/2010 Document Revised: 11/25/2015 Document Reviewed: 08/26/2014 Elsevier Interactive Patient Education  2018 [employer].    IF you received an x-ray today, you will receive an invoice from [facility]. Please contact [facility] at [phone] with questions or concerns regarding your invoice.   IF you received labwork today, you will receive an invoice  from [place]. Please contact [facility] at [phone] with questions or concerns regarding your invoice.   Our billing staff will not be able to assist you with questions regarding bills from these companies.  You will be contacted with the lab results as soon as they are available. The fastest way to get your results is to activate your My Chart account. Instructions are located on the last page of this paperwork. If you have not heard from [place] regarding the results in 2 weeks, please contact this office.

## 2017-11-09 LAB — BASIC METABOLIC PANEL
BUN/Creatinine Ratio: 13 (ref 9–20)
BUN: 13 mg/dL (ref 6–24)
CALCIUM: 9.2 mg/dL (ref 8.7–10.2)
CHLORIDE: 106 mmol/L (ref 96–106)
CO2: 21 mmol/L (ref 20–29)
Creatinine, Ser: 1.04 mg/dL (ref 0.76–1.27)
GFR, EST AFRICAN AMERICAN: 100 mL/min/{1.73_m2} (ref >59)
GFR, EST NON AFRICAN AMERICAN: 86 mL/min/{1.73_m2} (ref >59)
Glucose: 86 mg/dL (ref 65–99)
Potassium: 4 mmol/L (ref 3.5–5.2)
SODIUM: 143 mmol/L (ref 134–144)

## 2017-11-09 LAB — PSA: PROSTATE SPECIFIC AG, SERUM: 0.3 ng/mL (ref 0.0–4.0)

## 2017-11-22 ENCOUNTER — Encounter: Payer: Self-pay | Admitting: Internal Medicine

## 2017-11-22 DIAGNOSIS — R1013 Epigastric pain: Secondary | ICD-10-CM

## 2017-11-22 NOTE — Telephone Encounter (Signed)
I spoke to him  Sour stomach all week  Some intense cramps and doubled over in pain  He will come in for labs tomorrow - could have pancreatitis from 6 MP  I have ordered LFT and amylase and lipase  Recent CBC ok

## 2017-11-23 ENCOUNTER — Other Ambulatory Visit (INDEPENDENT_AMBULATORY_CARE_PROVIDER_SITE_OTHER): Payer: Managed Care, Other (non HMO)

## 2017-11-23 DIAGNOSIS — Z79899 Other long term (current) drug therapy: Secondary | ICD-10-CM

## 2017-11-23 DIAGNOSIS — R1013 Epigastric pain: Secondary | ICD-10-CM

## 2017-11-23 LAB — HEPATIC FUNCTION PANEL
ALBUMIN: 3.9 g/dL (ref 3.5–5.2)
ALT: 21 U/L (ref 0–53)
AST: 19 U/L (ref 0–37)
Alkaline Phosphatase: 94 U/L (ref 39–117)
Bilirubin, Direct: 0.1 mg/dL (ref 0.0–0.3)
TOTAL PROTEIN: 7.6 g/dL (ref 6.0–8.3)
Total Bilirubin: 0.4 mg/dL (ref 0.2–1.2)

## 2017-11-23 LAB — CBC WITH DIFFERENTIAL/PLATELET
BASOS ABS: 0 10*3/uL (ref 0.0–0.1)
Basophils Relative: 0.6 % (ref 0.0–3.0)
EOS ABS: 0.2 10*3/uL (ref 0.0–0.7)
Eosinophils Relative: 4.5 % (ref 0.0–5.0)
HEMATOCRIT: 43.4 % (ref 39.0–52.0)
HEMOGLOBIN: 14.8 g/dL (ref 13.0–17.0)
LYMPHS PCT: 36.8 % (ref 12.0–46.0)
Lymphs Abs: 2 10*3/uL (ref 0.7–4.0)
MCHC: 34.1 g/dL (ref 30.0–36.0)
MCV: 85.4 fl (ref 78.0–100.0)
MONO ABS: 0.6 10*3/uL (ref 0.1–1.0)
Monocytes Relative: 11.7 % (ref 3.0–12.0)
Neutro Abs: 2.5 10*3/uL (ref 1.4–7.7)
Neutrophils Relative %: 46.4 % (ref 43.0–77.0)
Platelets: 230 10*3/uL (ref 150.0–400.0)
RBC: 5.08 Mil/uL (ref 4.22–5.81)
RDW: 13.8 % (ref 11.5–15.5)
WBC: 5.3 10*3/uL (ref 4.0–10.5)

## 2017-11-23 LAB — LIPASE: LIPASE: 32 U/L (ref 11.0–59.0)

## 2017-11-23 LAB — AMYLASE: Amylase: 44 U/L (ref 27–131)

## 2017-11-27 ENCOUNTER — Encounter: Payer: Self-pay | Admitting: Internal Medicine

## 2017-11-27 ENCOUNTER — Other Ambulatory Visit: Payer: Self-pay | Admitting: Physician Assistant

## 2017-11-27 DIAGNOSIS — J309 Allergic rhinitis, unspecified: Secondary | ICD-10-CM

## 2017-11-27 NOTE — Progress Notes (Signed)
Labs ok.

## 2017-11-30 ENCOUNTER — Encounter: Payer: Self-pay | Admitting: Family Medicine

## 2017-11-30 ENCOUNTER — Ambulatory Visit (INDEPENDENT_AMBULATORY_CARE_PROVIDER_SITE_OTHER): Payer: Managed Care, Other (non HMO) | Admitting: Family Medicine

## 2017-11-30 ENCOUNTER — Other Ambulatory Visit: Payer: Self-pay

## 2017-11-30 VITALS — BP 150/94 | HR 77 | Temp 98.5°F | Resp 18 | Ht 70.98 in | Wt 290.6 lb

## 2017-11-30 DIAGNOSIS — I10 Essential (primary) hypertension: Secondary | ICD-10-CM | POA: Diagnosis not present

## 2017-11-30 NOTE — Progress Notes (Signed)
Subjective:  By signing my name below, I, Craig Guzman, attest that this documentation has been prepared under the direction and in the presence of Merri Ray, MD. Electronically Signed: [name], [place]. 11/30/2017 , 5:49 PM .  Patient was seen in Room 1 .   Patient ID: Craig Guzman, male    DOB: [birth year], 47 y.o.   MRN: [id number] Chief Complaint  Patient presents with  . Hypertension    f/u- pt states that he has a headache today   HPI Craig Guzman is a 47 y.o. male Here for follow up of HTN. Last saw patient on May 9th, with BP 138/92 at that time.   BP Readings from Last 3 Encounters:  11/30/17 (!) 136/94  11/08/17 (!) 138/92  10/31/17 (!) 140/100   Lab Results  Component Value Date   CREATININE 1.04 11/08/2017   We had increased his Cardura to 4 mg, continue on same dose of verapamil 360 mg qd and lisinopril. His electrolytes were normal when checked 3 weeks ago. His diastolic has been running 74Q-595G, and systolic 387F-643P; higher side with pain. His nocturia has improved from 3-4 times a night to just once a night. He didn't take Cardura one night, and could tell the difference. He denies chest pain, shortness of breath, dizziness or lightheadedness. He denies worsening headache or migraine.   He's followed by [name] for Crohn's disease, abdominal pain, possible gastroparesis. He's been taken out of work, plan to meet with general surgery. He hasn't been taking much of his flexeril. He describes pain from his muscles contracting. He had some pain prior to coming into the office today.   Patient Active Problem List   Diagnosis Date Noted  . Crohn's disease of small intestine with other complication ([place]) 29/51/8841  . Duodenal stricture   . Intractable vomiting with nausea   . Cigarette nicotine dependence without complication 66/12/3014  . Shifting sleep-work schedule, affecting sleep 05/15/2016  . Sleep deprivation 05/15/2016  . Sleep related  hypoventilation in conditions classified elsewhere 05/15/2016  . Sleep related headaches 05/15/2016  . Morbid obesity ([place]) 09/19/2013  . Diarrhea 07/10/2013  . 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 Hypertension 11/26/2012  . Gastroparesis??? 12/08/2011  . Common migraine without aura 12/08/2011  . Long-term use of immunosuppressant medication 08/08/2011  . Crohn's disease of stomach and colon-suspected 05/17/2011  . GERD (gastroesophageal reflux disease) 04/21/2011   Past Medical History:  Diagnosis Date  . Allergy    trees/ grasses/ animals/dust/mold  . Anxiety   . Biliary dyskinesia   . Colloid thyroid nodule   . Crohn's disease ([place])    Stomach, terminal ileum, cecum  . Gastric ulcer    antral  . GERD (gastroesophageal reflux disease)   . History of migraine headaches   . HTN (hypertension)   . Kidney stone 09/20/2012  . Migraine   . Obesity   . Pneumonia 02/27/2014   ED   Past Surgical History:  Procedure Laterality Date  . CHOLECYSTECTOMY  2011   [facility]  . COLONOSCOPY  07/26/11   Crohn's colitis, ileitis  . ESOPHAGOGASTRODUODENOSCOPY  05/04/11, 07/26/11   granulomatous gastritis - Crohn's  . ESOPHAGOGASTRODUODENOSCOPY (EGD) WITH PROPOFOL N/A 03/28/2017   Procedure: ESOPHAGOGASTRODUODENOSCOPY (EGD) WITH PROPOFOL  with balloon dilation of duodenum;  Surgeon: [name], MD;  Location: [facility];  Service: Gastroenterology;  Laterality: N/A;  . FLEXIBLE SIGMOIDOSCOPY    . JOINT REPLACEMENT    . MULTIPLE TOOTH EXTRACTIONS  2005  . SHOULDER  SURGERY     right  . VASECTOMY     bilateral w/lysis of penile adhesions   Allergies  Allergen Reactions  . Zithromax [Azithromycin] Hypertension   Prior to Admission medications   Medication Sig Start Date End Date Taking? Authorizing Provider  azelastine (ASTELIN) 0.1 % nasal spray Place 2 sprays into both nostrils 2 (two) times daily. Use in each nostril as directed 10/31/17   Tenna Delaine D, PA-C    benzonatate (TESSALON) 100 MG capsule Take 1-2 capsules (100-200 mg total) by mouth 3 (three) times daily as needed for cough. 10/31/17   Tenna Delaine D, PA-C  cyclobenzaprine (FLEXERIL) 10 MG tablet Take 1 tablet (10 mg total) by mouth 3 (three) times daily as needed (for migraines). 10/09/17   Ward Givens, NP  doxazosin (CARDURA) 4 MG tablet Take 1 tablet (4 mg total) by mouth daily. 11/08/17   [name], MD  Guaifenesin [facility] MAXIMUM STRENGTH) 1200 MG TB12 Take 1 tablet (1,200 mg total) by mouth every 12 (twelve) hours as needed. 10/31/17   Tenna Delaine D, PA-C  HYDROcodone-homatropine (HYCODAN) 5-1.5 MG/5ML syrup Take 5 mLs by mouth every 8 (eight) hours as needed for cough. 10/31/17   Tenna Delaine D, PA-C  lisinopril (PRINIVIL,ZESTRIL) 40 MG tablet Take 1 tablet (40 mg total) by mouth daily. Office visit needed for 90 day supply 11/08/17   [name], MD  mercaptopurine (PURINETHOL) 50 MG tablet Take 1 tablet (50 mg total) by mouth daily. Give on an empty stomach 1 hour before or 2 hours after meals. 05/02/17   Gatha Mayer, MD  metoCLOPramide (REGLAN) 10 MG tablet Take 1 tablet (10 mg total) by mouth 3 (three) times daily before meals. 09/06/17 12/05/17  Levin Erp, PA  omeprazole-sodium bicarbonate (ZEGERID) 40-1100 MG capsule TAKE 1 CAPSULE BY MOUTH AT BEDTIME. 04/25/17   [name], MD  ondansetron (ZOFRAN-ODT) 4 MG disintegrating tablet Take 1 tablet (4 mg total) by mouth every 8 (eight) hours as needed for nausea or vomiting. 03/19/17   Gatha Mayer, MD  prochlorperazine (COMPAZINE) 25 MG suppository Place 1 suppository (25 mg total) rectally every 12 (twelve) hours as needed for nausea or vomiting. 03/19/17   Gatha Mayer, MD  rizatriptan (MAXALT-MLT) 10 MG disintegrating tablet TAKE 1 TABLET (10 MG TOTAL) BY MOUTH 3 (THREE) TIMES DAILY AS NEEDED FOR MIGRAINE. 05/09/17   Kathrynn Ducking, MD  ustekinumab (STELARA) 90 MG/ML SOSY injection Inject  1 mL (90 mg total) into the skin every 8 (eight) weeks. 07/25/17   Gatha Mayer, MD  verapamil (CALAN-SR) 180 MG CR tablet Take 2 tablets (360 mg total) by mouth daily. 11/08/17   [name], MD   Social History   Socioeconomic History  . Marital status: Married    Spouse name: Not on file  . Number of children: 3  . Years of education: Some college  . Highest education level: Not on file  Occupational History  . Occupation: [employer]  Social Needs  . Financial resource strain: Not on file  . Food insecurity:    Worry: Not on file    Inability: Not on file  . Transportation needs:    Medical: Not on file    Non-medical: Not on file  Tobacco Use  . Smoking status: Current Every Day Smoker    Packs/day: 0.75    Years: 21.00    Pack years: 15.75    Types: Cigarettes  . Smokeless tobacco:  Former Systems developer    Types: Snuff  . Tobacco comment: Counseling sheet given in exam room   Substance and Sexual Activity  . Alcohol use: Yes    Alcohol/week: 0.0 oz    Comment: occ  . Drug use: No  . Sexual activity: Yes    Partners: Female  Lifestyle  . Physical activity:    Days per week: Not on file    Minutes per session: Not on file  . Stress: Not on file  Relationships  . Social connections:    Talks on phone: Not on file    Gets together: Not on file    Attends religious service: Not on file    Active member of club or organization: Not on file    Attends meetings of clubs or organizations: Not on file    Relationship status: Not on file  . Intimate partner violence:    Fear of current or ex partner: Not on file    Emotionally abused: Not on file    Physically abused: Not on file    Forced sexual activity: Not on file  Other Topics Concern  . Not on file  Social History Narrative   Married. Education: [employer].    Lives at home w/ his wife and kids   Right-handed   Caffeine: 3 sodas per day   Review of Systems  Constitutional: Negative for fatigue and unexpected  weight change.  Eyes: Negative for visual disturbance.  Respiratory: Negative for cough, chest tightness and shortness of breath.   Cardiovascular: Negative for chest pain, palpitations and leg swelling.  Gastrointestinal: Negative for abdominal pain and Guzman in stool.  Neurological: Positive for headaches. Negative for dizziness and light-headedness.       Objective:   Physical Exam  Constitutional: He is oriented to person, place, and time. He appears well-developed and well-nourished.  HENT:  Head: Normocephalic and atraumatic.  Eyes: Pupils are equal, round, and reactive to light. EOM are normal.  Neck: No JVD present. Carotid bruit is not present.  Cardiovascular: Normal rate, regular rhythm and normal heart sounds.  No murmur heard. Pulmonary/Chest: Effort normal and breath sounds normal. He has no rales.  Musculoskeletal: He exhibits no edema.  Neurological: He is alert and oriented to person, place, and time.  Skin: Skin is warm and dry.  Psychiatric: He has a normal mood and affect.  Vitals reviewed.   Vitals:   11/30/17 1657 11/30/17 1755  BP: (!) 136/94 (!) 150/94  Pulse: 77   Resp: 18   Temp: 98.5 F (36.9 C)   TempSrc: Oral   SpO2: 97%   Weight: 290 lb 9.6 oz (131.8 kg)   Height: 5' 10.98" (1.803 m)        Assessment & Plan:   Ebubechukwu Jedlicka is a 47 y.o. male Essential hypertension Improved with home readings more near normal.  Suspect his abdominal pain may be contributing to some of his elevated pressures.  Nighttime urination has improved with higher dose of doxazosin.  Decided to continue same doses for now with close home monitoring and if he remains over 762 systolic, 90 diastolic, would consider changes at that time.  Continue follow-up with gastroenterologist and planned surgical eval.   No orders of the defined types were placed in this encounter.  Patient Instructions   Guzman pressure is improved overall.  Borderline readings may be due to pain.  Keep a record of your Guzman pressures outside of the office and ifthey are consistently above 140  on the top number, or above 90 on the bottom number, let me know and we can further change your medications.   Thanks for coming in today.  Follow-up in the next 3 months for Guzman pressure, sooner if needed.     IF you received an x-ray today, you will receive an invoice from [facility]. Please contact [facility] at [phone] with questions or concerns regarding your invoice.   IF you received labwork today, you will receive an invoice from [place]. Please contact [facility] at [phone] with questions or concerns regarding your invoice.   Our billing staff will not be able to assist you with questions regarding bills from these companies.  You will be contacted with the lab results as soon as they are available. The fastest way to get your results is to activate your My Chart account. Instructions are located on the last page of this paperwork. If you have not heard from [place] regarding the results in 2 weeks, please contact this office.      I personally performed the services described in this documentation, which was scribed in my presence. The recorded information has been reviewed and considered for accuracy and completeness, addended by me as needed, and agree with information above.  Signed,   Merri Ray, MD [facility].  12/01/17 12:45 PM

## 2017-11-30 NOTE — Patient Instructions (Addendum)
Blood pressure is improved overall.  Borderline readings may be due to pain. Keep a record of your blood pressures outside of the office and ifthey are consistently above 140 on the top number, or above 90 on the bottom number, let me know and we can further change your medications.   Thanks for coming in today.  Follow-up in the next 3 months for blood pressure, sooner if needed.     IF you received an x-ray today, you will receive an invoice from [facility]. Please contact [facility] at [phone] with questions or concerns regarding your invoice.   IF you received labwork today, you will receive an invoice from [facility]. Please contact [facility] at [phone] with questions or concerns regarding your invoice.   Our billing staff will not be able to assist you with questions regarding bills from these companies.  You will be contacted with the lab results as soon as they are available. The fastest way to get your results is to activate your My Chart account. Instructions are located on the last page of this paperwork. If you have not heard from [place] regarding the results in 2 weeks, please contact this office.

## 2017-12-01 ENCOUNTER — Other Ambulatory Visit: Payer: Self-pay | Admitting: Physician Assistant

## 2017-12-01 DIAGNOSIS — J309 Allergic rhinitis, unspecified: Secondary | ICD-10-CM

## 2017-12-05 ENCOUNTER — Telehealth: Payer: Self-pay | Admitting: Internal Medicine

## 2017-12-05 NOTE — Telephone Encounter (Signed)
Patient wife calling stating pt is having more than normal abd pain and trouble swallowing soft things like mash potatoes. Pt wife would like advice until appt on 6.10.19.

## 2017-12-05 NOTE — Telephone Encounter (Signed)
Patient notified He will be NPO after midnight tonight and  I will call them in the am.

## 2017-12-05 NOTE — Telephone Encounter (Signed)
Wife reports that Craig Guzman is not tolerating liquids or semisolids. I spoke with Henrene Pastor as well.  Pain starts about 15-20 minutes after a meal.  Pain is epigastric area and upper abdomen.  Can even have pain with drinking liquids.  He is using zofran. No vomiting.  He has follow up with you on Monday.  He has a trip in a little less than 2 weeks to [place] and is concerned about his inability to eat.

## 2017-12-05 NOTE — Telephone Encounter (Signed)
If he starts vomiting and cannot stay hydrated then needs to go to ED  Let's see if I can scope him midday tomorrow (1230-1 time frame - will do moderate if needed  Or on Friday at [place]  Mod sedation ok  Dx duodenal stricture plan balloon dilation  Would tell him to stay NPO after midnight tonight as we will likely need to check on this first thing in AM  Let me know

## 2017-12-06 ENCOUNTER — Ambulatory Visit (HOSPITAL_COMMUNITY)
Admission: RE | Admit: 2017-12-06 | Discharge: 2017-12-06 | Disposition: A | Discharge status: Home / Self Care | Payer: Managed Care, Other (non HMO) | Source: Ambulatory Visit | Attending: Internal Medicine | Admitting: Internal Medicine

## 2017-12-06 ENCOUNTER — Encounter (HOSPITAL_COMMUNITY): Payer: Self-pay

## 2017-12-06 ENCOUNTER — Encounter: Payer: Self-pay | Admitting: Internal Medicine

## 2017-12-06 ENCOUNTER — Other Ambulatory Visit: Payer: Self-pay

## 2017-12-06 ENCOUNTER — Encounter (HOSPITAL_COMMUNITY)
Admission: RE | Disposition: A | Discharge status: Home / Self Care | Payer: Self-pay | Source: Ambulatory Visit | Attending: Internal Medicine

## 2017-12-06 DIAGNOSIS — F419 Anxiety disorder, unspecified: Secondary | ICD-10-CM | POA: Insufficient documentation

## 2017-12-06 DIAGNOSIS — K2950 Unspecified chronic gastritis without bleeding: Secondary | ICD-10-CM | POA: Diagnosis not present

## 2017-12-06 DIAGNOSIS — K315 Obstruction of duodenum: Secondary | ICD-10-CM

## 2017-12-06 DIAGNOSIS — Z87442 Personal history of urinary calculi: Secondary | ICD-10-CM | POA: Insufficient documentation

## 2017-12-06 DIAGNOSIS — Z8719 Personal history of other diseases of the digestive system: Secondary | ICD-10-CM | POA: Insufficient documentation

## 2017-12-06 DIAGNOSIS — Z79899 Other long term (current) drug therapy: Secondary | ICD-10-CM | POA: Insufficient documentation

## 2017-12-06 DIAGNOSIS — I10 Essential (primary) hypertension: Secondary | ICD-10-CM | POA: Diagnosis not present

## 2017-12-06 DIAGNOSIS — K50012 Crohn's disease of small intestine with intestinal obstruction: Secondary | ICD-10-CM

## 2017-12-06 DIAGNOSIS — K5000 Crohn's disease of small intestine without complications: Secondary | ICD-10-CM

## 2017-12-06 DIAGNOSIS — Z800 Family history of malignant neoplasm of digestive organs: Secondary | ICD-10-CM | POA: Insufficient documentation

## 2017-12-06 DIAGNOSIS — K5090 Crohn's disease, unspecified, without complications: Secondary | ICD-10-CM | POA: Insufficient documentation

## 2017-12-06 DIAGNOSIS — K219 Gastro-esophageal reflux disease without esophagitis: Secondary | ICD-10-CM | POA: Insufficient documentation

## 2017-12-06 HISTORY — PX: BALLOON DILATION: SHX5330

## 2017-12-06 HISTORY — PX: ESOPHAGOGASTRODUODENOSCOPY (EGD) WITH PROPOFOL: SHX5813

## 2017-12-06 SURGERY — ESOPHAGOGASTRODUODENOSCOPY (EGD) WITH PROPOFOL
Anesthesia: Moderate Sedation

## 2017-12-06 MED ORDER — LACTATED RINGERS IV SOLN
INTRAVENOUS | Status: DC
  Administered 2017-12-06: 1000 mL via INTRAVENOUS

## 2017-12-06 MED ORDER — DIPHENHYDRAMINE HCL 50 MG/ML IJ SOLN
INTRAMUSCULAR | Status: DC | PRN
  Administered 2017-12-06: 25 mg via INTRAVENOUS

## 2017-12-06 MED ORDER — DIPHENHYDRAMINE HCL 50 MG/ML IJ SOLN
INTRAMUSCULAR | Status: AC
  Filled 2017-12-06: qty 1

## 2017-12-06 MED ORDER — SUCRALFATE 1 G PO TABS: 1.0000 g | ORAL_TABLET | Freq: Three times a day (TID) | ORAL | 3 refills | Status: DC

## 2017-12-06 MED ORDER — FENTANYL CITRATE (PF) 100 MCG/2ML IJ SOLN
INTRAMUSCULAR | Status: DC | PRN
  Administered 2017-12-06 (×3): 25 ug via INTRAVENOUS

## 2017-12-06 MED ORDER — METOCLOPRAMIDE HCL 10 MG PO TABS: 10.0000 mg | ORAL_TABLET | Freq: Three times a day (TID) | ORAL | 2 refills | Status: DC

## 2017-12-06 MED ORDER — SODIUM CHLORIDE 0.9 % IV SOLN: INTRAVENOUS | Status: DC

## 2017-12-06 MED ORDER — MIDAZOLAM HCL 5 MG/ML IJ SOLN
INTRAMUSCULAR | Status: AC
  Filled 2017-12-06: qty 2

## 2017-12-06 MED ORDER — MIDAZOLAM HCL 10 MG/2ML IJ SOLN
INTRAMUSCULAR | Status: DC | PRN
  Administered 2017-12-06: 3 mg via INTRAVENOUS
  Administered 2017-12-06: 2 mg via INTRAVENOUS
  Administered 2017-12-06 (×2): 1 mg via INTRAVENOUS
  Administered 2017-12-06: 2 mg via INTRAVENOUS

## 2017-12-06 MED ORDER — BUTAMBEN-TETRACAINE-BENZOCAINE 2-2-14 % EX AERO
INHALATION_SPRAY | CUTANEOUS | Status: DC | PRN
  Administered 2017-12-06: 1 via TOPICAL

## 2017-12-06 MED ORDER — FENTANYL CITRATE (PF) 100 MCG/2ML IJ SOLN
INTRAMUSCULAR | Status: AC
  Filled 2017-12-06: qty 4

## 2017-12-06 SURGICAL SUPPLY — 14 items

## 2017-12-06 NOTE — Telephone Encounter (Addendum)
Patient notified to arrive at [facility] 3:00 today.  He is advised to be on clear liquid until noon then NPO

## 2017-12-06 NOTE — Progress Notes (Signed)
Called from PACU to witness narcotic waste of:  1 mg of Versed =0.25m 215m of Fentanyl =0.2559mWasted in trash, witnessed by [employer]

## 2017-12-06 NOTE — H&P (Signed)
[facility] History and Physical   Primary Care Physician:  [name], MD   Reason for Procedure:   epigastric pain  Plan:    EGD     HPI: Craig Guzman is a 47 y.o. male w/ duodenal stricture in gastroduodenal Crohn's w/ severe epigastric pain after eating. Has nausea and also has hx of recurrent vomiting. Cannot tolerate eating much at all   Past Medical History:  Diagnosis Date  . Allergy    trees/ grasses/ animals/dust/mold  . Anxiety   . Biliary dyskinesia   . Colloid thyroid nodule   . Crohn's disease ([place])    Stomach, terminal ileum, cecum  . Gastric ulcer    antral  . GERD (gastroesophageal reflux disease)   . History of migraine headaches   . HTN (hypertension)   . Kidney stone 09/20/2012  . Migraine   . Obesity   . Pneumonia 02/27/2014   ED    Past Surgical History:  Procedure Laterality Date  . CHOLECYSTECTOMY  2011   [facility]  . COLONOSCOPY  07/26/11   Crohn's colitis, ileitis  . ESOPHAGOGASTRODUODENOSCOPY  05/04/11, 07/26/11   granulomatous gastritis - Crohn's  . ESOPHAGOGASTRODUODENOSCOPY (EGD) WITH PROPOFOL N/A 03/28/2017   Procedure: ESOPHAGOGASTRODUODENOSCOPY (EGD) WITH PROPOFOL  with balloon dilation of duodenum;  Surgeon: [name], MD;  Location: [facility];  Service: Gastroenterology;  Laterality: N/A;  . FLEXIBLE SIGMOIDOSCOPY    . JOINT REPLACEMENT    . MULTIPLE TOOTH EXTRACTIONS  2005  . SHOULDER SURGERY     right  . VASECTOMY     bilateral w/lysis of penile adhesions    Prior to Admission medications   Medication Sig Start Date End Date Taking? Authorizing Provider  benzonatate (TESSALON) 100 MG capsule Take 1-2 capsules (100-200 mg total) by mouth 3 (three) times daily as needed for cough. 10/31/17  Yes Timmothy Euler, [place] D, PA-C  cyclobenzaprine (FLEXERIL) 10 MG tablet Take 1 tablet (10 mg total) by mouth 3 (three) times daily as needed (for migraines). 10/09/17  Yes Ward Givens, NP  doxazosin  (CARDURA) 4 MG tablet Take 1 tablet (4 mg total) by mouth daily. 11/08/17  Yes [name], MD  lisinopril (PRINIVIL,ZESTRIL) 40 MG tablet Take 1 tablet (40 mg total) by mouth daily. Office visit needed for 90 day supply 11/08/17  Yes [name], MD  mercaptopurine (PURINETHOL) 50 MG tablet Take 1 tablet (50 mg total) by mouth daily. Give on an empty stomach 1 hour before or 2 hours after meals. 05/02/17  Yes Gatha Mayer, MD  omeprazole-sodium bicarbonate (ZEGERID) 40-1100 MG capsule TAKE 1 CAPSULE BY MOUTH AT BEDTIME. 04/25/17  Yes [name], MD  ondansetron (ZOFRAN-ODT) 4 MG disintegrating tablet Take 1 tablet (4 mg total) by mouth every 8 (eight) hours as needed for nausea or vomiting. 03/19/17  Yes Gatha Mayer, MD  rizatriptan (MAXALT-MLT) 10 MG disintegrating tablet TAKE 1 TABLET (10 MG TOTAL) BY MOUTH 3 (THREE) TIMES DAILY AS NEEDED FOR MIGRAINE. 05/09/17  Yes Kathrynn Ducking, MD  verapamil (CALAN-SR) 180 MG CR tablet Take 2 tablets (360 mg total) by mouth daily. 11/08/17  Yes [name], MD  azelastine (ASTELIN) 0.1 % nasal spray PLACE 2 SPRAYS INTO BOTH NOSTRILS 2 (TWO) TIMES DAILY. USE IN EACH NOSTRIL AS DIRECTED 12/03/17   Tenna Delaine D, PA-C  Guaifenesin [facility] MAXIMUM STRENGTH) 1200 MG TB12 Take 1 tablet (1,200 mg total) by mouth every 12 (twelve) hours as needed. Patient not taking:  Reported on 11/30/2017 10/31/17   Tenna Delaine D, PA-C  HYDROcodone-homatropine [facility]) 5-1.5 MG/5ML syrup Take 5 mLs by mouth every 8 (eight) hours as needed for cough. Patient not taking: Reported on 11/30/2017 10/31/17   Tenna Delaine D, PA-C  metoCLOPramide (REGLAN) 10 MG tablet Take 1 tablet (10 mg total) by mouth 3 (three) times daily before meals. 09/06/17 12/05/17  Levin Erp, PA  prochlorperazine (COMPAZINE) 25 MG suppository Place 1 suppository (25 mg total) rectally every 12 (twelve) hours as needed for nausea or vomiting. 03/19/17   Gatha Mayer, MD   ustekinumab (STELARA) 90 MG/ML SOSY injection Inject 1 mL (90 mg total) into the skin every 8 (eight) weeks. 07/25/17   Gatha Mayer, MD    Current Facility-Administered Medications  Medication Dose Route Frequency Provider Last Rate Last Dose  . 0.9 %  sodium chloride infusion   Intravenous Continuous Gatha Mayer, MD      . lactated ringers infusion   Intravenous Continuous Gatha Mayer, MD 10 mL/hr at 12/06/17 1520 1,000 mL at 12/06/17 1520    Allergies as of 12/06/2017 - Review Complete 12/06/2017  Allergen Reaction Noted  . Zithromax [azithromycin] Hypertension 04/14/2011    Family History  Problem Relation Age of Onset  . Colon polyps Father   . Diabetes Father   . Hypertension Father   . Hypertension Mother   . Asthma Mother   . Heart disease Maternal Grandmother   . Colon cancer Neg Hx   . Rectal cancer Neg Hx   . Stomach cancer Neg Hx   . Esophageal cancer Neg Hx     Social History   Social History Narrative   Married. Education: [employer].    Lives at home w/ his wife and kids   Right-handed   Caffeine: 3 sodas per day     Review of Systems:  All other review of systems negative except as mentioned in the HPI.  Physical Exam: Vital signs in last 24 hours: Temp:  [98.2 F (36.8 C)] 98.2 F (36.8 C) (06/06 1517) Pulse Rate:  [75] 75 (06/06 1517) Resp:  [16] 16 (06/06 1517) BP: (177)/(109) 177/109 (06/06 1517) SpO2:  [99 %] 99 % (06/06 1517) Weight:  [290 lb (131.5 kg)] 290 lb (131.5 kg) (06/06 1517)   General:   Alert,  Well-developed, well-nourished, pleasant and cooperative in NAD Lungs:  Clear throughout to auscultation.   Heart:  Regular rate and rhythm; no murmurs, clicks, rubs,  or gallops. Abdomen:  Obese Soft, nontender and nondistended. Normal bowel sounds.   Neuro/Psych:  Alert and cooperative. Normal mood and affect. A and O x 3   @Carl  Simonne Maffucci, MD, [facility] [phone] (pager) 12/06/2017 4:18 PM@

## 2017-12-06 NOTE — Op Note (Signed)
[facility] Patient Name: Craig Guzman Procedure Date: 12/06/2017 MRN: [id number] Attending MD: Gatha Mayer , MD Date of Birth: 1970-08-30 CSN: [id number] Age: 47 Admit Type: Inpatient Procedure:                Upper GI endoscopy Indications:              Epigastric abdominal pain, Stenosis of the                            duodenum, Follow-up of duodenal stenosis, For                            therapy of duodenal stenosis, Crohn's disease Providers:                Gatha Mayer, MD, Presley Raddle, RN, Alan Mulder, Technician Referring MD:              Medicines:                Midazolam 9 mg IV, Fentanyl 75 micrograms IV,                            Cetacaine spray, Diphenhydramine 25 mg IV Complications:            No immediate complications. Estimated Blood Loss:     Estimated blood loss was minimal. Procedure:                Pre-Anesthesia Assessment:                           - Prior to the procedure, a History and Physical                            was performed, and patient medications and                            allergies were reviewed. The patient's tolerance of                            previous anesthesia was also reviewed. The risks                            and benefits of the procedure and the sedation                            options and risks were discussed with the patient.                            All questions were answered, and informed consent                            was obtained. Prior Anticoagulants: The patient has  taken no previous anticoagulant or antiplatelet                            agents. ASA Grade Assessment: III - A patient with                            severe systemic disease. After reviewing the risks                            and benefits, the patient was deemed in                            satisfactory condition to undergo the procedure.           After obtaining informed consent, the endoscope was                            passed under direct vision. Throughout the                            procedure, the patient's blood pressure, pulse, and                            oxygen saturations were monitored continuously. The                            EG-[id number] ([id number]) scope was introduced through the                            mouth, and advanced to the second part of duodenum.                            The upper GI endoscopy was accomplished without                            difficulty. The patient tolerated the procedure. Scope In: Scope Out: Findings:      An acquired benign-appearing, intrinsic moderate stenosis was found in       the duodenal bulb and was traversed. A TTS dilator was passed through       the scope. Dilation with a 12-13.5-15 mm pyloric balloon dilator was       performed. The dilation site was examined and showed mild mucosal       disruption. Estimated blood loss was minimal.      Diffuse moderate inflammation characterized by erythema and friability       was found in the gastric antrum.      The exam was otherwise without abnormality.      The cardia and gastric fundus were otherwise normal on retroflexion. Impression:               - Acquired duodenal stenosis. Dilated. 15 mm - not                            sure this is main issue though likely contributing.  Stenosis is a few cm past pylorus it appears                           - Chronic gastritis. Crohn's - not as bad as in                            past but persiosts and UGI shows deformity in March                            - think he has scarring that is not responding to                            medical tx                           - The examination was otherwise normal.                           - No specimens collected. Moderate Sedation:      Moderate (conscious) sedation was administered by the endoscopy  nurse       and supervised by the endoscopist. The following parameters were       monitored: oxygen saturation, heart rate, blood pressure, respiratory       rate, EKG, adequacy of pulmonary ventilation, and response to care.       Total physician intraservice time was 20 minutes. Recommendation:           - Patient has a contact number available for                            emergencies. The signs and symptoms of potential                            delayed complications were discussed with the                            patient. Return to normal activities tomorrow.                            Written discharge instructions were provided to the                            patient.                           - Resume previous diet. Low fiber max                           - Continue present medications.                           - Hold metaclopramide - ? adding to sxs w/                            prokinetic effect and abnormal stomach/stricture  add sucralfate 1 g qid                           He is not tolerating medical therapy - having pain                            that I think is due to chrronic inflammation and                            scarring                           I do not think he can tolerate working due to the                            level and frequency of pain, nausea and vomiting.                            he is applying for short-term disability and I                            think that makes sens now with hopes to recover and                            return to work                           Will refer to Dr. Sheryn Bison of [facility]                            [facility] re: gastroduodenal Crohn's - failed medical                            therapy with chronic epigastric pain and duodenal                            stricture Procedure Code(s):        --- Professional ---                           [phone], Esophagogastroduodenoscopy,  flexible,                            transoral; with dilation of gastric/duodenal                            stricture(s) (eg, balloon, bougie)                           99152, 59, Moderate sedation services provided by                            the same physician or other qualified health care  professional performing the diagnostic or                            therapeutic service that the sedation supports,                            requiring the presence of an independent trained                            observer to assist in the monitoring of the                            patient's level of consciousness and physiological                            status; initial 15 minutes of intraservice time,                            patient age 54 years or older Diagnosis Code(s):        --- Professional ---                           K31.5, Obstruction of duodenum                           K29.50, Unspecified chronic gastritis without                            bleeding                           K50.912, Crohn's disease, unspecified, with                            intestinal obstruction                           R10.13, Epigastric pain CPT copyright 2017 American Medical Association. All rights reserved. The codes documented in this report are preliminary and upon coder review may  be revised to meet current compliance requirements. Gatha Mayer, MD 12/06/2017 5:06:54 PM This report has been signed electronically. Number of Addenda: 0

## 2017-12-06 NOTE — Discharge Instructions (Signed)
° °  Things look similar - I think the chronic stomach inflammation is giving you the problems.  I dilated the narrow area to see if that would help some.  I have prescribed sucralfate to see if that helps.  I want you to hold the metaclopramide - it makes the stomach squeeze and contract and I wonder if that may be giving you pain so it is worth a try.  OK to advance diet as tolerated but would stay on a very soft diet and no raw vegetables.  Keep the Monday appointment.  I need to get you an appointment at [facility].  We are working on it.  I appreciate the opportunity to care for you.  Gatha Mayer, MD, [facility]

## 2017-12-07 ENCOUNTER — Encounter (HOSPITAL_COMMUNITY): Payer: Self-pay | Admitting: Internal Medicine

## 2017-12-10 ENCOUNTER — Encounter: Payer: Self-pay | Admitting: Internal Medicine

## 2017-12-10 ENCOUNTER — Ambulatory Visit (INDEPENDENT_AMBULATORY_CARE_PROVIDER_SITE_OTHER)
Admission: RE | Admit: 2017-12-10 | Discharge: 2017-12-10 | Disposition: A | Discharge status: Home / Self Care | Payer: Managed Care, Other (non HMO) | Source: Ambulatory Visit | Attending: Internal Medicine | Admitting: Internal Medicine

## 2017-12-10 ENCOUNTER — Ambulatory Visit (INDEPENDENT_AMBULATORY_CARE_PROVIDER_SITE_OTHER): Payer: Managed Care, Other (non HMO) | Admitting: Internal Medicine

## 2017-12-10 VITALS — BP 140/96 | HR 84 | Temp 99.2°F | Ht 71.0 in | Wt 294.0 lb

## 2017-12-10 DIAGNOSIS — Z79899 Other long term (current) drug therapy: Secondary | ICD-10-CM

## 2017-12-10 DIAGNOSIS — R059 Cough, unspecified: Secondary | ICD-10-CM

## 2017-12-10 DIAGNOSIS — K50012 Crohn's disease of small intestine with intestinal obstruction: Secondary | ICD-10-CM

## 2017-12-10 DIAGNOSIS — R05 Cough: Secondary | ICD-10-CM | POA: Diagnosis not present

## 2017-12-10 DIAGNOSIS — J069 Acute upper respiratory infection, unspecified: Secondary | ICD-10-CM

## 2017-12-10 DIAGNOSIS — Z7960 Long term (current) use of unspecified immunomodulators and immunosuppressants: Secondary | ICD-10-CM

## 2017-12-10 MED ORDER — HYDROCODONE-HOMATROPINE 5-1.5 MG/5ML PO SYRP
5.0000 mL | ORAL_SOLUTION | Freq: Four times a day (QID) | ORAL | 0 refills | Status: DC | PRN
Start: 2017-12-10 — End: 2018-01-24

## 2017-12-10 NOTE — Patient Instructions (Addendum)
  Please go to the basement and get a chest x-ray.    We will call you with results and plans.    We have sent the following medications to your pharmacy for you to pick up at your convenience: Hycodan   I appreciate the opportunity to care for you. Silvano Rusk, MD, [facility]

## 2017-12-10 NOTE — Assessment & Plan Note (Signed)
SXS BETTER

## 2017-12-10 NOTE — Progress Notes (Signed)
Craig Guzman 47 y.o. [birth year] [id number]  Assessment & Plan:   Encounter Diagnoses  Name Primary?  . Gastroduodenal Crohn's disease with intestinal obstruction ([place]) Yes  . Cough   . Upper respiratory tract infection suspected   . Long-term use of immunosuppressant medication     At least for the time being he is better after I stopped his metoclopramide and dilated his duodenal stricture.  He is also now on Carafate.  A surgery referral to [facility] is in the works to consider surgical therapy as he has had persistent symptoms of abdominal pain nausea and vomiting over the years despite multiple treatment methods medically.  His antrum looks fixed on a recent upper GI series with the stricture.  He has this cough may be he has an upper respiratory infection.  I do not think he aspirated during the procedure last Thursday.  I will get an x-ray.  He may need a course of treatment for atypicals.  Hycodan cough syrup is prescribed.  He is to stay on a soft bland diet for the most part and gradually advance as tolerated.  CXR clear Hycodan quite helpful as of 6/11 No fever Will observe No Abx  I appreciate the opportunity to care for this patient. JJ:[id number], Ranell Patrick, MD   Subjective:   Chief Complaint: Cough and follow-up of abdominal pain.  Crohn's disease.  HPI Craig Guzman is here with his wife and daughter, he underwent dilation of a duodenal stricture about 3 days ago.  He is been having a lot of abdominal pain problems.  He has a history of a duodenal stricture and gastroduodenal Crohn's disease as well as a history of Crohn's colitis.  He is now on Stelara but he was having lots of abdominal pain every time he ate.  I dilated the stricture to 15 mm.  It was not that stenotic.  He had his typical chronic inflammatory appearance of the antrum though it is not as severe as it has been in the past.  Remainder the exam look normal.  Because I thought his metoclopramide might be causing  problems i.e. prokinetic effects on a damaged gastric mucosa and muscularis and stricture I stopped it.  I added Carafate.  He says his abdominal pain is much better he was able to eat food without burning or pain afterwards.  He has vomited a little bit but that is because he is having a  constant cough type issue since the procedure.  Productive of whitish sputum occasionally yellow.  He had a similar issue about a month ago and was treated with Hycodan and nasal sprays and allergy medications.  That resolved but his kids have been sick in similar ways.  He has not been febrile.  He took his Stelara yesterday. He is due to travel to [place] in 1 week. Allergies  Allergen Reactions  . Zithromax [Azithromycin] Hypertension   Current Meds  Medication Sig  . azelastine (ASTELIN) 0.1 % nasal spray PLACE 2 SPRAYS INTO BOTH NOSTRILS 2 (TWO) TIMES DAILY. USE IN EACH NOSTRIL AS DIRECTED  . cyclobenzaprine (FLEXERIL) 10 MG tablet Take 1 tablet (10 mg total) by mouth 3 (three) times daily as needed (for migraines).  Marland Kitchen doxazosin (CARDURA) 4 MG tablet Take 1 tablet (4 mg total) by mouth daily.  Marland Kitchen lisinopril (PRINIVIL,ZESTRIL) 40 MG tablet Take 1 tablet (40 mg total) by mouth daily. Office visit needed for 90 day supply  . mercaptopurine (PURINETHOL) 50 MG tablet Take 1 tablet (50  mg total) by mouth daily. Give on an empty stomach 1 hour before or 2 hours after meals.  Marland Kitchen omeprazole-sodium bicarbonate (ZEGERID) 40-1100 MG capsule TAKE 1 CAPSULE BY MOUTH AT BEDTIME.  Marland Kitchen ondansetron (ZOFRAN-ODT) 4 MG disintegrating tablet Take 1 tablet (4 mg total) by mouth every 8 (eight) hours as needed for nausea or vomiting.  . prochlorperazine (COMPAZINE) 25 MG suppository Place 1 suppository (25 mg total) rectally every 12 (twelve) hours as needed for nausea or vomiting.  . rizatriptan (MAXALT-MLT) 10 MG disintegrating tablet TAKE 1 TABLET (10 MG TOTAL) BY MOUTH 3 (THREE) TIMES DAILY AS NEEDED FOR MIGRAINE.  Marland Kitchen sucralfate  (CARAFATE) 1 g tablet Take 1 tablet (1 g total) by mouth 4 (four) times daily -  with meals and at bedtime.  . ustekinumab (STELARA) 90 MG/ML SOSY injection Inject 1 mL (90 mg total) into the skin every 8 (eight) weeks.  . verapamil (CALAN-SR) 180 MG CR tablet Take 2 tablets (360 mg total) by mouth daily.   Past Medical History:  Diagnosis Date  . Allergy    trees/ grasses/ animals/dust/mold  . Anxiety   . Biliary dyskinesia   . Colloid thyroid nodule   . Crohn's disease ([place])    Stomach, terminal ileum, cecum  . Gastric ulcer    antral  . GERD (gastroesophageal reflux disease)   . History of migraine headaches   . HTN (hypertension)   . Kidney stone 09/20/2012  . Migraine   . Obesity   . Pneumonia 02/27/2014   ED   Past Surgical History:  Procedure Laterality Date  . BALLOON DILATION N/A 12/06/2017   Procedure: BALLOON DILATION;  Surgeon: Gatha Mayer, MD;  Location: [facility];  Service: Endoscopy;  Laterality: N/A;  . CHOLECYSTECTOMY  2011   [place]  . COLONOSCOPY  07/26/11   Crohn's colitis, ileitis  . ESOPHAGOGASTRODUODENOSCOPY  05/04/11, 07/26/11   granulomatous gastritis - Crohn's  . ESOPHAGOGASTRODUODENOSCOPY (EGD) WITH PROPOFOL N/A 03/28/2017   Procedure: ESOPHAGOGASTRODUODENOSCOPY (EGD) WITH PROPOFOL  with balloon dilation of duodenum;  Surgeon: [name], MD;  Location: [facility];  Service: Gastroenterology;  Laterality: N/A;  . ESOPHAGOGASTRODUODENOSCOPY (EGD) WITH PROPOFOL N/A 12/06/2017   Procedure: ESOPHAGOGASTRODUODENOSCOPY (EGD) WITH PROPOFOL;  Surgeon: Gatha Mayer, MD;  Location: [facility];  Service: Endoscopy;  Laterality: N/A;  . FLEXIBLE SIGMOIDOSCOPY    . JOINT REPLACEMENT    . MULTIPLE TOOTH EXTRACTIONS  2005  . SHOULDER SURGERY     right  . VASECTOMY     bilateral w/lysis of penile adhesions   Social History   Social History Narrative   Married. Education: [employer].    Lives at home w/ his wife and kids   Right-handed    Caffeine: 3 sodas per day   family history includes Asthma in his mother; Colon polyps in his father; Diabetes in his father; Heart disease in his maternal grandmother; Hypertension in his father and mother.   Review of Systems As above  Objective:   Physical Exam BP (!) 140/96 (BP Location: Left Arm, Patient Position: Sitting, Cuff Size: Normal)   Pulse 84   Temp 99.2 F (37.3 C)   Ht 5' 11"  (1.803 m)   Wt 294 lb (133.4 kg)   BMI 41.00 kg/m  He is coughing but is in no acute distress otherwise. The eyes are anicteric The lungs are clear bilaterally posterior and anteriorly. The heart sounds are normal. The abdomen is somewhat obese soft and nontender without organomegaly or mass. He  is awake alert and oriented.

## 2017-12-11 ENCOUNTER — Encounter: Payer: Self-pay | Admitting: Internal Medicine

## 2017-12-11 NOTE — Progress Notes (Signed)
CXR ok Will get a symptom update

## 2018-01-03 ENCOUNTER — Other Ambulatory Visit: Payer: Self-pay | Admitting: Physician Assistant

## 2018-01-03 DIAGNOSIS — J309 Allergic rhinitis, unspecified: Secondary | ICD-10-CM

## 2018-01-10 ENCOUNTER — Telehealth: Payer: Self-pay | Admitting: Internal Medicine

## 2018-01-10 NOTE — Telephone Encounter (Signed)
Here are instructions to the patient from Dr. Sheryn Bison in [place], Moshe Salisbury, NP - 01/10/2018 10:30 AM EDT  - Schedule colonoscopy with [name].  - Will get CT prior to your pre-op visit with [place]. This will be ordered and performed here at [facility].  - Return to clinic after you colonoscopy and CT.   Electronically signed by Sherin Quarry, NP at 01/10/2018 12:20 PM EDT    Ok to schedule? Here or hosp?

## 2018-01-10 NOTE — Telephone Encounter (Signed)
Colonoscopy at [facility] ok   OK to use a 730 if needed

## 2018-01-10 NOTE — Telephone Encounter (Signed)
Patient has been scheduled for colon and pre-visit in the [place] for 7/16 and 7/25

## 2018-01-10 NOTE — Telephone Encounter (Signed)
Left message for patient to call back  

## 2018-01-12 ENCOUNTER — Other Ambulatory Visit: Payer: Self-pay | Admitting: Internal Medicine

## 2018-01-14 NOTE — Telephone Encounter (Signed)
How many refills Sir?

## 2018-01-15 ENCOUNTER — Encounter: Payer: Self-pay | Admitting: Internal Medicine

## 2018-01-15 ENCOUNTER — Other Ambulatory Visit: Payer: Self-pay

## 2018-01-15 ENCOUNTER — Ambulatory Visit (AMBULATORY_SURGERY_CENTER): Payer: Self-pay | Admitting: *Deleted

## 2018-01-15 VITALS — Ht 72.0 in | Wt 316.0 lb

## 2018-01-15 DIAGNOSIS — K5000 Crohn's disease of small intestine without complications: Secondary | ICD-10-CM

## 2018-01-15 NOTE — Telephone Encounter (Signed)
Refill x 6

## 2018-01-15 NOTE — Progress Notes (Signed)
No egg or soy allergy known to patient  No issues with past sedation with any surgeries  or procedures, no intubation problems  No diet pills per patient No home 02 use per patient  No blood thinners per patient  Pt denies issues with constipation  No A fib or A flutter  EMMI video sent to pt's e mail -  Wife in pv today with pt

## 2018-01-17 NOTE — Telephone Encounter (Signed)
Refill x 6

## 2018-01-24 ENCOUNTER — Telehealth: Payer: Self-pay

## 2018-01-24 ENCOUNTER — Ambulatory Visit (AMBULATORY_SURGERY_CENTER): Payer: Managed Care, Other (non HMO) | Admitting: Internal Medicine

## 2018-01-24 ENCOUNTER — Encounter: Payer: Self-pay | Admitting: Internal Medicine

## 2018-01-24 VITALS — BP 134/78 | HR 61 | Temp 99.3°F | Resp 22 | Ht 72.0 in | Wt 316.0 lb

## 2018-01-24 DIAGNOSIS — K50012 Crohn's disease of small intestine with intestinal obstruction: Secondary | ICD-10-CM | POA: Diagnosis present

## 2018-01-24 DIAGNOSIS — K5000 Crohn's disease of small intestine without complications: Secondary | ICD-10-CM

## 2018-01-24 MED ORDER — SODIUM CHLORIDE 0.9 % IV SOLN: 500.0000 mL | Freq: Once | INTRAVENOUS | Status: DC

## 2018-01-24 NOTE — Progress Notes (Signed)
Pt's states no medical or surgical changes since previsit or office visit. 

## 2018-01-24 NOTE — Patient Instructions (Addendum)
   The colon and end of the small intestine look good.  I hope the surgery helps you.  Next routine colonoscopy or other screening test in 10 years - 2029  I appreciate the opportunity to care for you. Gatha Mayer, MD, FACG  YOU HAD AN ENDOSCOPIC PROCEDURE TODAY AT [facility]:   Refer to the procedure report that was given to you for any specific questions about what was found during the examination.  If the procedure report does not answer your questions, please call your gastroenterologist to clarify.  If you requested that your care partner not be given the details of your procedure findings, then the procedure report has been included in a sealed envelope for you to review at your convenience later.  YOU SHOULD EXPECT: Some feelings of bloating in the abdomen. Passage of more gas than usual.  Walking can help get rid of the air that was put into your GI tract during the procedure and reduce the bloating. If you had a lower endoscopy (such as a colonoscopy or flexible sigmoidoscopy) you may notice spotting of blood in your stool or on the toilet paper. If you underwent a bowel prep for your procedure, you may not have a normal bowel movement for a few days.  Please Note:  You might notice some irritation and congestion in your nose or some drainage.  This is from the oxygen used during your procedure.  There is no need for concern and it should clear up in a day or so.  SYMPTOMS TO REPORT IMMEDIATELY:   Following lower endoscopy (colonoscopy or flexible sigmoidoscopy):  Excessive amounts of blood in the stool  Significant tenderness or worsening of abdominal pains  Swelling of the abdomen that is new, acute  Fever of 100F or higher  For urgent or emergent issues, a gastroenterologist can be reached at any hour by calling [phone].   DIET:  We do recommend a small meal at first, but then you may proceed to your regular diet.  Drink plenty of fluids but  you should avoid alcoholic beverages for 24 hours.  ACTIVITY:  You should plan to take it easy for the rest of today and you should NOT DRIVE or use heavy machinery until tomorrow (because of the sedation medicines used during the test).    FOLLOW UP: Our staff will call the number listed on your records the next business day following your procedure to check on you and address any questions or concerns that you may have regarding the information given to you following your procedure. If we do not reach you, we will leave a message.  However, if you are feeling well and you are not experiencing any problems, there is no need to return our call.  We will assume that you have returned to your regular daily activities without incident.  If any biopsies were taken you will be contacted by phone or by letter within the next 1-3 weeks.  Please call us at [phone] if you have not heard about the biopsies in 3 weeks.    SIGNATURES/CONFIDENTIALITY: You and/or your care partner have signed paperwork which will be entered into your electronic medical record.  These signatures attest to the fact that that the information above on your After Visit Summary has been reviewed and is understood.  Full responsibility of the confidentiality of this discharge information lies with you and/or your care-partner.

## 2018-01-24 NOTE — Telephone Encounter (Signed)
Faxed to Dr Kem Parkinson at [facility] fax # [phone].

## 2018-01-24 NOTE — Telephone Encounter (Signed)
-----   Message from Gatha Mayer, MD sent at 01/24/2018  2:05 PM EDT ----- Regarding: fAX COLON REPORT To Dr. Kem Parkinson at [facility]

## 2018-01-24 NOTE — Progress Notes (Signed)
Report to PACU, RN, vss, BBS= Clear.  

## 2018-01-24 NOTE — Op Note (Signed)
[facility] Patient Name: Craig Guzman Procedure Date: 01/24/2018 1:32 PM MRN: [id number] Endoscopist: Gatha Mayer , MD Age: 47 Referring MD:  Date of Birth: September 21, 1970 Gender: Male Account #: [id number] Procedure:                Colonoscopy Indications:              Crohn's disease of the small bowel and colon,                            Follow-up of Crohn's disease of the small bowel and                            colon Medicines:                Propofol per Anesthesia, Monitored Anesthesia Care Procedure:                Pre-Anesthesia Assessment:                           - Prior to the procedure, a History and Physical                            was performed, and patient medications and                            allergies were reviewed. The patient's tolerance of                            previous anesthesia was also reviewed. The risks                            and benefits of the procedure and the sedation                            options and risks were discussed with the patient.                            All questions were answered, and informed consent                            was obtained. Prior Anticoagulants: The patient has                            taken no previous anticoagulant or antiplatelet                            agents. ASA Grade Assessment: II - A patient with                            mild systemic disease. After reviewing the risks                            and benefits, the patient was deemed in  satisfactory condition to undergo the procedure.                           After obtaining informed consent, the colonoscope                            was passed under direct vision. Throughout the                            procedure, the patient's blood pressure, pulse, and                            oxygen saturations were monitored continuously. The                            Colonoscope was introduced through  the anus and                            advanced to the the terminal ileum, with                            identification of the appendiceal orifice and IC                            valve. The colonoscopy was performed without                            difficulty. The terminal ileum, ileocecal valve,                            appendiceal orifice, and rectum were photographed.                            The quality of the bowel preparation was excellent.                            The patient tolerated the procedure well. The bowel                            preparation used was Miralax. Scope In: 1:43:03 PM Scope Out: 1:55:33 PM Scope Withdrawal Time: 0 hours 10 minutes 4 seconds  Total Procedure Duration: 0 hours 12 minutes 30 seconds  Findings:                 The perianal and digital rectal examinations were                            normal. Pertinent negatives include normal prostate                            (size, shape, and consistency).                           The terminal ileum appeared normal.  The entire examined colon appeared normal on direct                            and retroflexion views. Complications:            No immediate complications. Estimated Blood Loss:     Estimated blood loss: none. Impression:               - The examined portion of the ileum was normal.                           - The entire examined colon is normal on direct and                            retroflexion views. NO SIGNS OF ACTIVE CROHN'S                           - No specimens collected. Recommendation:           - Patient has a contact number available for                            emergencies. The signs and symptoms of potential                            delayed complications were discussed with the                            patient. Return to normal activities tomorrow.                            Written discharge instructions were provided to the                             patient.                           - Resume previous diet.                           - Continue present medications.                           - Repeat colonoscopy in 10 years for screening                            purposes.                           - will fax a copy to Dr. Kem Parkinson, Colorectal                            Surgery [facility] - patient is awaiting upper GI surgery                            for Crohn's of stomach and duodenum Gatha Mayer,  MD 01/24/2018 2:04:40 PM This report has been signed electronically.

## 2018-01-25 ENCOUNTER — Other Ambulatory Visit: Payer: Self-pay | Admitting: Internal Medicine

## 2018-01-25 ENCOUNTER — Other Ambulatory Visit: Payer: Self-pay | Admitting: Family Medicine

## 2018-01-25 DIAGNOSIS — I10 Essential (primary) hypertension: Secondary | ICD-10-CM

## 2018-01-25 DIAGNOSIS — R351 Nocturia: Secondary | ICD-10-CM

## 2018-01-29 ENCOUNTER — Telehealth: Payer: Self-pay

## 2018-01-29 NOTE — Telephone Encounter (Signed)
  Follow up Call-  Call back number 01/24/2018 07/16/2017 03/20/2017  Post procedure Call Back phone  # [phone]  Permission to leave phone message Yes Yes Yes  Some recent data might be hidden     Patient questions:  Do you have a fever, pain , or abdominal swelling? No. Pain Score  0 *  Have you tolerated food without any problems? Yes.    Have you been able to return to your normal activities? Yes.    Do you have any questions about your discharge instructions: Diet   No. Medications  No. Follow up visit  No.  Do you have questions or concerns about your Care? No.  Actions: * If pain score is 4 or above: No action needed, pain <4.

## 2018-01-31 ENCOUNTER — Encounter: Payer: Self-pay | Admitting: Family Medicine

## 2018-01-31 HISTORY — PX: GASTROJEJUNOSTOMY: SHX1697

## 2018-02-10 ENCOUNTER — Emergency Department (HOSPITAL_COMMUNITY)
Admission: EM | Admit: 2018-02-10 | Discharge: 2018-02-11 | Disposition: A | Discharge status: Home / Self Care | Payer: Managed Care, Other (non HMO) | Attending: Emergency Medicine | Admitting: Emergency Medicine

## 2018-02-10 ENCOUNTER — Other Ambulatory Visit: Payer: Self-pay

## 2018-02-10 DIAGNOSIS — R059 Cough, unspecified: Secondary | ICD-10-CM

## 2018-02-10 DIAGNOSIS — I10 Essential (primary) hypertension: Secondary | ICD-10-CM | POA: Insufficient documentation

## 2018-02-10 DIAGNOSIS — R509 Fever, unspecified: Secondary | ICD-10-CM | POA: Diagnosis not present

## 2018-02-10 DIAGNOSIS — Z79899 Other long term (current) drug therapy: Secondary | ICD-10-CM | POA: Insufficient documentation

## 2018-02-10 DIAGNOSIS — Z87891 Personal history of nicotine dependence: Secondary | ICD-10-CM | POA: Diagnosis not present

## 2018-02-10 DIAGNOSIS — R05 Cough: Secondary | ICD-10-CM

## 2018-02-10 NOTE — ED Triage Notes (Signed)
Pt to ed by POV with c/o of low grade fever, cough with production of clear fluid, and chest pain on coughing. Pt states that he had surgery at [facility] on Wed for Crohns Disease where they moved part of jejunum and duodenum from diseased areas to undiseased areas. Pt states that they went home Friday and the coughing started yesterday. Pt called doctor and wanted him to be seen and evaluated here for possible pnuemonia. Pt rates pain 6/10 on coughing. Pt is A&O x4. Pt also has hx of HTN.

## 2018-02-11 ENCOUNTER — Emergency Department (HOSPITAL_COMMUNITY): Payer: Managed Care, Other (non HMO)

## 2018-02-11 LAB — CBC WITH DIFFERENTIAL/PLATELET
Basophils Absolute: 0 K/uL (ref 0.0–0.1)
Basophils Relative: 0 %
Eosinophils Absolute: 0.4 K/uL (ref 0.0–0.7)
Eosinophils Relative: 7 %
HCT: 40.3 % (ref 39.0–52.0)
Hemoglobin: 13.9 g/dL (ref 13.0–17.0)
Lymphocytes Relative: 26 %
Lymphs Abs: 1.7 K/uL (ref 0.7–4.0)
MCH: 29.1 pg (ref 26.0–34.0)
MCHC: 34.5 g/dL (ref 30.0–36.0)
MCV: 84.5 fL (ref 78.0–100.0)
Monocytes Absolute: 1.1 K/uL — ABNORMAL HIGH (ref 0.1–1.0)
Monocytes Relative: 17 %
Neutro Abs: 3.2 K/uL (ref 1.7–7.7)
Neutrophils Relative %: 50 %
Platelets: 177 K/uL (ref 150–400)
RBC: 4.77 MIL/uL (ref 4.22–5.81)
RDW: 13.2 % (ref 11.5–15.5)
WBC: 6.4 K/uL (ref 4.0–10.5)

## 2018-02-11 LAB — COMPREHENSIVE METABOLIC PANEL
ALK PHOS: 97 U/L (ref 38–126)
ALT: 24 U/L (ref 0–44)
ANION GAP: 8 (ref 5–15)
AST: 21 U/L (ref 15–41)
Albumin: 3.5 g/dL (ref 3.5–5.0)
BUN: 12 mg/dL (ref 6–20)
CO2: 24 mmol/L (ref 22–32)
Calcium: 8.5 mg/dL — ABNORMAL LOW (ref 8.9–10.3)
Chloride: 105 mmol/L (ref 98–111)
Creatinine, Ser: 1.09 mg/dL (ref 0.61–1.24)
GFR calc non Af Amer: >60 mL/min (ref >60)
GLUCOSE: 101 mg/dL — AB (ref 70–99)
Potassium: 3.4 mmol/L — ABNORMAL LOW (ref 3.5–5.1)
SODIUM: 137 mmol/L (ref 135–145)
Total Bilirubin: 0.8 mg/dL (ref 0.3–1.2)
Total Protein: 7.4 g/dL (ref 6.5–8.1)

## 2018-02-11 LAB — URINALYSIS, ROUTINE W REFLEX MICROSCOPIC
BACTERIA UA: NONE SEEN
BILIRUBIN URINE: NEGATIVE
Glucose, UA: NEGATIVE mg/dL
Ketones, ur: NEGATIVE mg/dL
Leukocytes, UA: NEGATIVE
NITRITE: NEGATIVE
Protein, ur: NEGATIVE mg/dL
SPECIFIC GRAVITY, URINE: 1.009 (ref 1.005–1.030)
pH: 6 (ref 5.0–8.0)

## 2018-02-11 LAB — I-STAT TROPONIN, ED: Troponin i, poc: 0.01 ng/mL (ref 0.00–0.08)

## 2018-02-11 LAB — I-STAT CG4 LACTIC ACID, ED: LACTIC ACID, VENOUS: 1.03 mmol/L (ref 0.5–1.9)

## 2018-02-11 MED ORDER — DEXTROMETHORPHAN POLISTIREX ER 30 MG/5ML PO SUER: 30.0000 mg | ORAL | 0 refills | Status: DC | PRN

## 2018-02-11 MED ORDER — OXYCODONE HCL 5 MG PO TABS
5.0000 mg | ORAL_TABLET | Freq: Once | ORAL | Status: AC
  Administered 2018-02-11: 5 mg via ORAL
  Filled 2018-02-11: qty 1

## 2018-02-11 MED ORDER — BENZONATATE 100 MG PO CAPS: 100.0000 mg | ORAL_CAPSULE | Freq: Three times a day (TID) | ORAL | 0 refills | Status: DC

## 2018-02-11 MED ORDER — SODIUM CHLORIDE 0.9 % IV BOLUS
1000.0000 mL | Freq: Once | INTRAVENOUS | Status: AC
  Administered 2018-02-11: 1000 mL via INTRAVENOUS

## 2018-02-11 NOTE — ED Notes (Signed)
EKG given to EDP,Mesner,MD., for review.

## 2018-02-11 NOTE — ED Provider Notes (Signed)
[facility] DEPT Provider Note   CSN: [id number] Arrival date & time: 02/10/18  2333     History   Chief Complaint Chief Complaint  Patient presents with  . Fever  . Cough    HPI Craig Guzman is a 47 y.o. male.  The history is provided by the patient and medical records.  Fever   Associated symptoms include cough.  Cough     47 y.o. M with hx of anxiety, crohn's disease, gastic ulcers, HTN, hx of migraines, sleep apnea, presenting to the ED for low grade fever and cough.  Patient recently underwent partial resection of his jejunum and duodenum for his Crohn's disease on 02/06/18.   States he started having cough and low grade fevers on Friday, 2 days ago.  States cough productive with clear phlegm.  Denies vomiting or diarrhea, BM yesterday was normal.  Has been drinking fluids and protein shakes without issue.  States his abdomen just feels sore, more so after coughing.  No urinary symptoms.  States he called the on call doctor and recommended them to come in for some blood work and evaluation for source of fever.  Patient denies hx of DVT or PE, currently on lovenox injections which wife has been giving as directed.  Past Medical History:  Diagnosis Date  . Allergy    trees/ grasses/ animals/dust/mold  . Anxiety   . Biliary dyskinesia   . Colloid thyroid nodule   . Crohn's disease ([place])    Stomach, terminal ileum, cecum  . Gastric ulcer    antral  . GERD (gastroesophageal reflux disease)   . History of migraine headaches   . HTN (hypertension)   . Kidney stone 09/20/2012  . Migraine   . Obesity   . Pneumonia 02/27/2014   ED  . Sleep apnea    has cpap- does not wear     Patient Active Problem List   Diagnosis Date Noted  . Crohn's disease of small intestine with other complication ([place]) 50/03/3817  . Duodenal stricture   . Intractable vomiting with nausea   . Cigarette nicotine dependence without complication 29/93/7169  . Shifting  sleep-work schedule, affecting sleep 05/15/2016  . Sleep deprivation 05/15/2016  . Sleep related hypoventilation in conditions classified elsewhere 05/15/2016  . Sleep related headaches 05/15/2016  . Morbid obesity ([place]) 09/19/2013  . Diarrhea 07/10/2013  . 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 Hypertension 11/26/2012  . Gastroparesis??? 12/08/2011  . Common migraine without aura 12/08/2011  . Long-term use of immunosuppressant medication 08/08/2011  . Gastroduodenal Crohn's disease with intestinal obstruction ([place]) 05/17/2011  . GERD (gastroesophageal reflux disease) 04/21/2011    Past Surgical History:  Procedure Laterality Date  . BALLOON DILATION N/A 12/06/2017   Procedure: BALLOON DILATION;  Surgeon: Gatha Mayer, MD;  Location: [facility];  Service: Endoscopy;  Laterality: N/A;  . CHOLECYSTECTOMY  2011   [place]  . COLONOSCOPY  07/26/11   Crohn's colitis, ileitis  . ESOPHAGOGASTRODUODENOSCOPY  05/04/11, 07/26/11   granulomatous gastritis - Crohn's  . ESOPHAGOGASTRODUODENOSCOPY (EGD) WITH PROPOFOL N/A 03/28/2017   Procedure: ESOPHAGOGASTRODUODENOSCOPY (EGD) WITH PROPOFOL  with balloon dilation of duodenum;  Surgeon: [name], MD;  Location: [facility];  Service: Gastroenterology;  Laterality: N/A;  . ESOPHAGOGASTRODUODENOSCOPY (EGD) WITH PROPOFOL N/A 12/06/2017   Procedure: ESOPHAGOGASTRODUODENOSCOPY (EGD) WITH PROPOFOL;  Surgeon: Gatha Mayer, MD;  Location: [facility];  Service: Endoscopy;  Laterality: N/A;  . FLEXIBLE SIGMOIDOSCOPY    . MULTIPLE TOOTH EXTRACTIONS  2005  .  SHOULDER SURGERY     right  . UPPER GASTROINTESTINAL ENDOSCOPY    . VASECTOMY     bilateral w/lysis of penile adhesions        Home Medications    Prior to Admission medications   Medication Sig Start Date End Date Taking? Authorizing Provider  azelastine (ASTELIN) 0.1 % nasal spray PLACE 2 SPRAYS INTO BOTH NOSTRILS 2 (TWO) TIMES DAILY. USE IN EACH NOSTRIL AS DIRECTED 01/04/18    [name], MD  bisacodyl (DULCOLAX) 5 MG EC tablet Take 5 mg by mouth once. 5 mg x 4 tablets for colon 7-25    [provider]  cetirizine (ZYRTEC) 10 MG tablet TAKE ONE TABLET (10 MG DOSE) BY MOUTH DAILY. 11/30/17   [provider]  cyclobenzaprine (FLEXERIL) 10 MG tablet Take 1 tablet (10 mg total) by mouth 3 (three) times daily as needed (for migraines). 10/09/17   Ward Givens, NP  doxazosin (CARDURA) 4 MG tablet Take 1 tablet (4 mg total) by mouth daily. 11/08/17   [name], MD  lisinopril (PRINIVIL,ZESTRIL) 40 MG tablet Take 1 tablet (40 mg total) by mouth daily. Office visit needed for 90 day supply 11/08/17   [name], MD  mercaptopurine (PURINETHOL) 50 MG tablet Take 1 tablet (50 mg total) by mouth daily. Give on an empty stomach 1 hour before or 2 hours after meals. 05/02/17   Gatha Mayer, MD  metoCLOPramide (REGLAN) 10 MG tablet Take 1 tablet (10 mg total) by mouth 3 (three) times daily before meals. See if stopping this reduces pain Patient not taking: Reported on 12/10/2017 12/06/17 03/06/18  Gatha Mayer, MD  omeprazole-sodium bicarbonate (ZEGERID) 40-1100 MG capsule TAKE 1 CAPSULE BY MOUTH AT BEDTIME. 04/25/17   [name], MD  ondansetron (ZOFRAN-ODT) 4 MG disintegrating tablet TAKE 1 TABLET BY MOUTH EVERY 8 HOURS AS NEEDED FOR NAUSEA AND VOMITING 01/15/18   Gatha Mayer, MD  polyethylene glycol powder (MIRALAX) powder Take 1 Container by mouth once. 238 grams for colon 7-25    [provider]  prochlorperazine (COMPAZINE) 25 MG suppository Place 1 suppository (25 mg total) rectally every 12 (twelve) hours as needed for nausea or vomiting. Patient not taking: Reported on 01/15/2018 03/19/17   Gatha Mayer, MD  rizatriptan (MAXALT-MLT) 10 MG disintegrating tablet TAKE 1 TABLET (10 MG TOTAL) BY MOUTH 3 (THREE) TIMES DAILY AS NEEDED FOR MIGRAINE. 05/09/17   Kathrynn Ducking, MD  sucralfate (CARAFATE) 1 g tablet TAKE 1 TABLET (1  G TOTAL) BY MOUTH 4 (FOUR) TIMES DAILY - WITH MEALS AND AT BEDTIME. 01/25/18   Gatha Mayer, MD  ustekinumab (STELARA) 90 MG/ML SOSY injection Inject 1 mL (90 mg total) into the skin every 8 (eight) weeks. 07/25/17   Gatha Mayer, MD  verapamil (CALAN-SR) 180 MG CR tablet Take 2 tablets (360 mg total) by mouth daily. 11/08/17   [name], MD    Family History Family History  Problem Relation Age of Onset  . Colon polyps Father   . Diabetes Father   . Hypertension Father   . Hypertension Mother   . Asthma Mother   . Heart disease Maternal Grandmother   . Colon cancer Neg Hx   . Rectal cancer Neg Hx   . Stomach cancer Neg Hx   . Esophageal cancer Neg Hx     Social History Social History   Tobacco Use  . Smoking status: Former Smoker    Packs/day:  0.75    Years: 21.00    Pack years: 15.75    Types: Cigarettes    Last attempt to quit: 12/01/2017    Years since quitting: 0.1  . Smokeless tobacco: Former Systems developer    Types: Snuff  . Tobacco comment: Counseling sheet given in exam room   Substance Use Topics  . Alcohol use: Yes    Alcohol/week: 0.0 standard drinks    Comment: occ  . Drug use: No     Allergies   Zithromax [azithromycin]   Review of Systems Review of Systems  Constitutional: Positive for fever.  Respiratory: Positive for cough.   All other systems reviewed and are negative.    Physical Exam Updated Vital Signs BP (!) 160/109 (BP Location: Right Arm)   Pulse 90   Temp 98.7 F (37.1 C) (Oral)   Resp 20   Ht 6' (1.829 m)   Wt (!) 140.6 kg   SpO2 97%   BMI 42.04 kg/m   Physical Exam  Constitutional: He is oriented to person, place, and time. He appears well-developed and well-nourished.  HENT:  Head: Normocephalic and atraumatic.  Right Ear: Tympanic membrane and ear canal normal.  Left Ear: Tympanic membrane and ear canal normal.  Nose: Nose normal.  Mouth/Throat: Uvula is midline, oropharynx is clear and moist and mucous membranes are  normal.  Eyes: Pupils are equal, round, and reactive to light. Conjunctivae and EOM are normal.  Neck: Normal range of motion.  Cardiovascular: Normal rate, regular rhythm and normal heart sounds.  Pulmonary/Chest: Effort normal and breath sounds normal. He has no wheezes. He has no rhonchi.  Lungs overall clear, no distress Coughing up mostly spit with some clear mucous  Abdominal: Soft. Bowel sounds are normal. There is no tenderness. There is no rigidity and no guarding.  Laparoscopic surgical incision appear clean without signs of infection, abdomen overall soft and non-tender  Musculoskeletal: Normal range of motion.  Neurological: He is alert and oriented to person, place, and time.  Skin: Skin is warm and dry.  Psychiatric: He has a normal mood and affect.  Nursing note and vitals reviewed.    ED Treatments / Results  Labs (all labs ordered are listed, but only abnormal results are displayed) Labs Reviewed  COMPREHENSIVE METABOLIC PANEL - Abnormal; Notable for the following components:      Result Value   Potassium 3.4 (*)    Glucose, Bld 101 (*)    Calcium 8.5 (*)    All other components within normal limits  CBC WITH DIFFERENTIAL/PLATELET - Abnormal; Notable for the following components:   Monocytes Absolute 1.1 (*)    All other components within normal limits  URINALYSIS, ROUTINE W REFLEX MICROSCOPIC - Abnormal; Notable for the following components:   Hgb urine dipstick SMALL (*)    All other components within normal limits  I-STAT CG4 LACTIC ACID, ED  I-STAT TROPONIN, ED    EKG EKG Interpretation  Date/Time:  Monday February 11 2018 00:28:50 EDT Ventricular Rate:  73 PR Interval:    QRS Duration: 97 QT Interval:  410 QTC Calculation: 452 R Axis:   70 Text Interpretation:  Sinus rhythm Probable left atrial enlargement Nonspecific T abnormalities, lateral leads Baseline wander in lead(s) II III aVF V4 No significant change was found Confirmed by Shanon Rosser  [phone]) on 02/11/2018 12:39:49 AM   Radiology Dg Chest 2 View  Result Date: 02/11/2018 CLINICAL DATA:  Fever and productive cough. Chest pain on coughing. History of Crohn disease.  Recent abdominal surgery. EXAM: CHEST - 2 VIEW COMPARISON:  12/10/2017 FINDINGS: Shallow inspiration. Normal heart size and pulmonary vascularity. No focal airspace disease or consolidation in the lungs. No blunting of costophrenic angles. No pneumothorax. Mediastinal contours appear intact. Degenerative changes in the spine. IMPRESSION: No active disease. Electronically Signed   By: Lucienne Capers M.D.   On: 02/11/2018 00:38    Procedures Procedures (including critical care time)  Medications Ordered in ED Medications  oxyCODONE (Oxy IR/ROXICODONE) immediate release tablet 5 mg (5 mg Oral Given 02/11/18 0040)  sodium chloride 0.9 % bolus 1,000 mL (0 mLs Intravenous Stopped 02/11/18 0054)     Initial Impression / Assessment and Plan / ED Course  I have reviewed the triage vital signs and the nursing notes.  Pertinent labs & imaging results that were available during my care of the patient were reviewed by me and considered in my medical decision making (see chart for details).  47 year old male here with low-grade fever and cough.  Recently discharged from [facility] after surgery for his Crohn's disease, had partial resection of his jejunum and duodenum.  States he has been healing well.  2 days ago developed low-grade fever and cough.  Cough is productive with clear sputum per patient.  He is afebrile and nontoxic in appearance here.  Cough on exam mostly appears dry, seems to be spitting into cough but I do not appreciate much mucus mixed in this.  No hemoptysis.  His lungs are clear without any wheezes or rhonchi.  Abdominal incisions are clean, no significant tenderness or peritoneal signs.  Vital signs are stable.  Patient is receiving Lovenox injections as prescribed by his doctor, wife has been giving as  directed without issue.  We will plan for screening labs, troponin, EKG, chest x-ray as well as urinalysis.  Patient given IV fluids.  Labs all reassuring.  WBC count is normal, lactate normal.  CXR clear.  UA without signs of infection.  Patient continues to appear well, tolerating PO fluids without issue.  At this time, no systemic signs of infection.  He remains afebrile and nontoxic in appearance here.  Vital signs are stable.  No tachycardia or hypoxia and patient is on daily lovenox currently so feel PE less likely.  Trop and EKG WNL.  Feel he is stable for discharge home.  Will have him follow-up as an OP with his PCP.  Has scheduled postop follow-up with his surgeon.  He will return here for any new or worsening symptoms.  Final Clinical Impressions(s) / ED Diagnoses   Final diagnoses:  Cough    ED Discharge Orders         Ordered              dextromethorphan (DELSYM) 30 MG/5ML liquid  As needed     02/11/18 0314           Larene Pickett, PA-C 02/11/18 0320    Molpus, Jenny Reichmann, MD 02/11/18 [phone]

## 2018-02-11 NOTE — Discharge Instructions (Addendum)
Take the prescribed medication as directed. Make sure to drink lots of fluids. Follow-up with your primary care doctor. Follow-up with your surgeon as scheduled. Return to the ED for new or worsening symptoms.

## 2018-03-01 ENCOUNTER — Encounter: Payer: Self-pay | Admitting: Family Medicine

## 2018-03-01 ENCOUNTER — Other Ambulatory Visit: Payer: Self-pay

## 2018-03-01 ENCOUNTER — Ambulatory Visit (INDEPENDENT_AMBULATORY_CARE_PROVIDER_SITE_OTHER): Payer: Managed Care, Other (non HMO) | Admitting: Family Medicine

## 2018-03-01 VITALS — BP 144/85 | HR 92 | Temp 98.0°F | Resp 16 | Ht 72.0 in | Wt 320.0 lb

## 2018-03-01 DIAGNOSIS — E876 Hypokalemia: Secondary | ICD-10-CM

## 2018-03-01 DIAGNOSIS — R351 Nocturia: Secondary | ICD-10-CM | POA: Diagnosis not present

## 2018-03-01 DIAGNOSIS — M545 Low back pain, unspecified: Secondary | ICD-10-CM

## 2018-03-01 DIAGNOSIS — I10 Essential (primary) hypertension: Secondary | ICD-10-CM

## 2018-03-01 MED ORDER — DOXAZOSIN MESYLATE 8 MG PO TABS: 8.0000 mg | ORAL_TABLET | Freq: Every day | ORAL | 0 refills | Status: DC

## 2018-03-01 MED ORDER — CYCLOBENZAPRINE HCL 5 MG PO TABS: 5.0000 mg | ORAL_TABLET | Freq: Three times a day (TID) | ORAL | 1 refills | Status: DC | PRN

## 2018-03-01 NOTE — Patient Instructions (Addendum)
Increase doxazosin to 8 mg at night.  Watch for lightheadedness or dizziness, especially when first standing in the morning.  No other change to blood pressure medicines at this time.  Recheck in the next 2 to 3 months.  Make sure you see me before you run out of your other blood pressure medications.  See information below on low back pain.  Some of that may be due to some wear and tear arthritis with the deconditioning from recent surgery.  Try heat, cool compresses, range of motion and stretches as tolerated with Flexeril if needed.  If not improving in the next 1 to 2 weeks, or any worsening sooner, return to discuss further and possible imaging.  If you have lab work done today you will be contacted with your lab results within the next 2 weeks.  If you have not heard from [place] then please contact us. The fastest way to get your results is to register for My Chart.   IF you received an x-ray today, you will receive an invoice from [facility]. Please contact [facility] at [phone] with questions or concerns regarding your invoice.   IF you received labwork today, you will receive an invoice from [facility]. Please contact [facility] at [phone] with questions or concerns regarding your invoice.   Our billing staff will not be able to assist you with questions regarding bills from these companies.  You will be contacted with the lab results as soon as they are available. The fastest way to get your results is to activate your My Chart account. Instructions are located on the last page of this paperwork. If you have not heard from [place] regarding the results in 2 weeks, please contact this office.

## 2018-03-01 NOTE — Progress Notes (Signed)
Subjective:  By signing my name below, I, Essence Howell, attest that this documentation has been prepared under the direction and in the presence of [name], MD Electronically Signed: Ladene Artist, ED Scribe 03/01/2018 at 2:22 PM.   Patient ID: Craig Guzman, male    DOB: 10-27-1970, 47 y.o.   MRN: [id number]  Chief Complaint  Patient presents with  . Hypertension    follow-up   . Back Pain    pt states he has been having spasms x 1 week    HPI Craig Guzman is a 47 y.o. male who presents to Primary Care at [facility] for f/u HTN and back pain.  HTN Lab Results  Component Value Date   CREATININE 1.09 02/11/2018   BP Readings from Last 3 Encounters:  03/01/18 (!) 144/85  02/10/18 (!) 160/109  01/24/18 134/78  Last seen in May. BP was improved. Suspected elevated readings due to abdominal pain. Continued on high dose of cardura 4 mg qd, lisinopril 4 mg qd, verapamil 360 mg qd. - Pt reports BP readings of 160/90s in the hospital. Pt had a robotic gastrojejunostomy for crohn's diseae on 8/7 through [place], appointment with Dr. Sheryn Bison on 9/5. Denies cp, new sob, lightheadedness, dizziness, HA, visual changes, leg swelling, blood in stools, melena.  Back Pain Pt noticed spasms that radiate across low back x 2 wks. He suspects that back spasms began after lounging around for a while after surgery. Pt tried 1 10 mg flexeril tab with temporary relief. States pain improves some with walking/moving. Denies fall/injury.  Patient Active Problem List   Diagnosis Date Noted  . Crohn's disease of small intestine with other complication ([place]) 81/44/8185  . Duodenal stricture   . Intractable vomiting with nausea   . Cigarette nicotine dependence without complication 63/14/9702  . Shifting sleep-work schedule, affecting sleep 05/15/2016  . Sleep deprivation 05/15/2016  . Sleep related hypoventilation in conditions classified elsewhere 05/15/2016  . Sleep related headaches 05/15/2016  .  Morbid obesity ([place]) 09/19/2013  . Diarrhea 07/10/2013  . 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 Hypertension 11/26/2012  . Gastroparesis??? 12/08/2011  . Common migraine without aura 12/08/2011  . Long-term use of immunosuppressant medication 08/08/2011  . Gastroduodenal Crohn's disease with intestinal obstruction ([place]) 05/17/2011  . GERD (gastroesophageal reflux disease) 04/21/2011   Past Medical History:  Diagnosis Date  . Allergy    trees/ grasses/ animals/dust/mold  . Anxiety   . Biliary dyskinesia   . Colloid thyroid nodule   . Crohn's disease ([place])    Stomach, terminal ileum, cecum  . Gastric ulcer    antral  . GERD (gastroesophageal reflux disease)   . History of migraine headaches   . HTN (hypertension)   . Kidney stone 09/20/2012  . Migraine   . Obesity   . Pneumonia 02/27/2014   ED  . Sleep apnea    has cpap- does not wear    Past Surgical History:  Procedure Laterality Date  . BALLOON DILATION N/A 12/06/2017   Procedure: BALLOON DILATION;  Surgeon: Gatha Mayer, MD;  Location: [facility];  Service: Endoscopy;  Laterality: N/A;  . CHOLECYSTECTOMY  2011   [place]  . COLONOSCOPY  07/26/11   Crohn's colitis, ileitis  . ESOPHAGOGASTRODUODENOSCOPY  05/04/11, 07/26/11   granulomatous gastritis - Crohn's  . ESOPHAGOGASTRODUODENOSCOPY (EGD) WITH PROPOFOL N/A 03/28/2017   Procedure: ESOPHAGOGASTRODUODENOSCOPY (EGD) WITH PROPOFOL  with balloon dilation of duodenum;  Surgeon: [name], MD;  Location: [facility];  Service: Gastroenterology;  Laterality: N/A;  . ESOPHAGOGASTRODUODENOSCOPY (EGD) WITH PROPOFOL N/A 12/06/2017   Procedure: ESOPHAGOGASTRODUODENOSCOPY (EGD) WITH PROPOFOL;  Surgeon: Gatha Mayer, MD;  Location: [facility];  Service: Endoscopy;  Laterality: N/A;  . FLEXIBLE SIGMOIDOSCOPY    . MULTIPLE TOOTH EXTRACTIONS  2005  . SHOULDER SURGERY     right  . UPPER GASTROINTESTINAL ENDOSCOPY    . VASECTOMY     bilateral w/lysis of penile  adhesions   Allergies  Allergen Reactions  . Zithromax [Azithromycin] Hypertension   Prior to Admission medications   Medication Sig Start Date End Date Taking? Authorizing Provider  azelastine (ASTELIN) 0.1 % nasal spray PLACE 2 SPRAYS INTO BOTH NOSTRILS 2 (TWO) TIMES DAILY. USE IN EACH NOSTRIL AS DIRECTED 01/04/18   [name], MD  bisacodyl (DULCOLAX) 5 MG EC tablet Take 5 mg by mouth once. 5 mg x 4 tablets for colon 7-25    [provider]  cetirizine (ZYRTEC) 10 MG tablet TAKE ONE TABLET (10 MG DOSE) BY MOUTH DAILY. 11/30/17   [provider]  cyclobenzaprine (FLEXERIL) 10 MG tablet Take 1 tablet (10 mg total) by mouth 3 (three) times daily as needed (for migraines). 10/09/17   Ward Givens, NP  dextromethorphan (DELSYM) 30 MG/5ML liquid Take 5 mLs (30 mg total) by mouth as needed for cough. 02/11/18   Larene Pickett, PA-C  doxazosin (CARDURA) 4 MG tablet Take 1 tablet (4 mg total) by mouth daily. 11/08/17   [name], MD  lisinopril (PRINIVIL,ZESTRIL) 40 MG tablet Take 1 tablet (40 mg total) by mouth daily. Office visit needed for 90 day supply 11/08/17   [name], MD  mercaptopurine (PURINETHOL) 50 MG tablet Take 1 tablet (50 mg total) by mouth daily. Give on an empty stomach 1 hour before or 2 hours after meals. 05/02/17   Gatha Mayer, MD  metoCLOPramide (REGLAN) 10 MG tablet Take 1 tablet (10 mg total) by mouth 3 (three) times daily before meals. See if stopping this reduces pain Patient not taking: Reported on 12/10/2017 12/06/17 03/06/18  Gatha Mayer, MD  omeprazole-sodium bicarbonate (ZEGERID) 40-1100 MG capsule TAKE 1 CAPSULE BY MOUTH AT BEDTIME. 04/25/17   [name], MD  ondansetron (ZOFRAN-ODT) 4 MG disintegrating tablet TAKE 1 TABLET BY MOUTH EVERY 8 HOURS AS NEEDED FOR NAUSEA AND VOMITING 01/15/18   Gatha Mayer, MD  polyethylene glycol powder (MIRALAX) powder Take 1 Container by mouth once. 238 grams for colon 7-25    [provider]  prochlorperazine (COMPAZINE) 25 MG suppository Place 1 suppository (25 mg total) rectally every 12 (twelve) hours as needed for nausea or vomiting. Patient not taking: Reported on 01/15/2018 03/19/17   Gatha Mayer, MD  rizatriptan (MAXALT-MLT) 10 MG disintegrating tablet TAKE 1 TABLET (10 MG TOTAL) BY MOUTH 3 (THREE) TIMES DAILY AS NEEDED FOR MIGRAINE. 05/09/17   Kathrynn Ducking, MD  sucralfate (CARAFATE) 1 g tablet TAKE 1 TABLET (1 G TOTAL) BY MOUTH 4 (FOUR) TIMES DAILY - WITH MEALS AND AT BEDTIME. 01/25/18   Gatha Mayer, MD  ustekinumab (STELARA) 90 MG/ML SOSY injection Inject 1 mL (90 mg total) into the skin every 8 (eight) weeks. 07/25/17   Gatha Mayer, MD  verapamil (CALAN-SR) 180 MG CR tablet Take 2 tablets (360 mg total) by mouth daily. 11/08/17   [name], MD   Social History   Socioeconomic History  . Marital status: Married    Spouse name: Not on file  .  Number of children: 3  . Years of education: Some college  . Highest education level: Not on file  Occupational History  . Occupation: [employer]  Social Needs  . Financial resource strain: Not on file  . Food insecurity:    Worry: Not on file    Inability: Not on file  . Transportation needs:    Medical: Not on file    Non-medical: Not on file  Tobacco Use  . Smoking status: Former Smoker    Packs/day: 0.75    Years: 21.00    Pack years: 15.75    Types: Cigarettes    Last attempt to quit: 12/01/2017    Years since quitting: 0.2  . Smokeless tobacco: Former Systems developer    Types: Snuff  . Tobacco comment: Counseling sheet given in exam room   Substance and Sexual Activity  . Alcohol use: Yes    Alcohol/week: 0.0 standard drinks    Comment: occ  . Drug use: No  . Sexual activity: Yes    Partners: Female  Lifestyle  . Physical activity:    Days per week: Not on file    Minutes per session: Not on file  . Stress: Not on file  Relationships  . Social connections:    Talks on phone: Not on  file    Gets together: Not on file    Attends religious service: Not on file    Active member of club or organization: Not on file    Attends meetings of clubs or organizations: Not on file    Relationship status: Not on file  . Intimate partner violence:    Fear of current or ex partner: Not on file    Emotionally abused: Not on file    Physically abused: Not on file    Forced sexual activity: Not on file  Other Topics Concern  . Not on file  Social History Narrative   Married. Education: [employer].    Lives at home w/ his wife and kids   Right-handed   Caffeine: 3 sodas per day   Review of Systems  Eyes: Negative for visual disturbance.  Respiratory: Negative for shortness of breath.   Cardiovascular: Negative for chest pain and leg swelling.  Gastrointestinal: Negative for blood in stool.  Musculoskeletal: Positive for back pain.  Neurological: Negative for dizziness, light-headedness and headaches.      Objective:   Physical Exam  Constitutional: He is oriented to person, place, and time. He appears well-developed and well-nourished.  HENT:  Head: Normocephalic and atraumatic.  Eyes: Pupils are equal, round, and reactive to light. EOM are normal.  Neck: No JVD present. Carotid bruit is not present.  Cardiovascular: Normal rate, regular rhythm and normal heart sounds.  No murmur heard. Pulmonary/Chest: Effort normal and breath sounds normal. He has no rales.  Musculoskeletal: He exhibits no edema.  Slight tenderness along mid lumbar spine, minimal paraspinals. Neg seated straight leg raise. Intact ROM. Able to heel and toe walk without difficulty.  Neurological: He is alert and oriented to person, place, and time.  Skin: Skin is warm and dry.  Psychiatric: He has a normal mood and affect.  Vitals reviewed.  Vitals:   03/01/18 1357  BP: (!) 144/85  Pulse: 92  Resp: 16  Temp: 98 F (36.7 C)  TempSrc: Oral  SpO2: 98%  Weight: (!) 320 lb (145.2 kg)  Height: 6'  (1.829 m)      Assessment & Plan:   Craig Guzman is a 47  y.o. male Essential hypertension - Plan: Basic metabolic panel, doxazosin (CARDURA) 8 MG tablet Nocturia - Plan: doxazosin (CARDURA) 8 MG tablet  -BP uncontrolled, including with outside readings.  Tolerating doxazosin, will increase to 8 mg daily.  Potential side effects discussed, continue ACE inhibitor and calcium channel blocker at same doses.  Avoiding chlorthalidone or HCTZ due to previous concerns regarding pancreatitis.  Recheck next 2 to 3 months.  RTC precautions  Hypokalemia - Plan: Basic metabolic panel  -Borderline previously, check BMP  Bilateral low back pain without sciatica, unspecified chronicity - Plan: cyclobenzaprine (FLEXERIL) 5 MG tablet  -Possible deconditioning after surgery with spasm.  No red flags on history or exam.  Initial trial of heat, range of motion and stretches as tolerated, ice as needed.  Trial of Flexeril low-dose if needed, and recheck if not improving next 1 to 2 weeks for possible imaging.  Sooner if worse.   Meds ordered this encounter  Medications  . doxazosin (CARDURA) 8 MG tablet    Sig: Take 1 tablet (8 mg total) by mouth daily.    Dispense:  90 tablet    Refill:  0  . cyclobenzaprine (FLEXERIL) 5 MG tablet    Sig: Take 1 tablet (5 mg total) by mouth 3 (three) times daily as needed for muscle spasms (start qhs prn due to sedation).    Dispense:  15 tablet    Refill:  1   Patient Instructions   Increase doxazosin to 8 mg at night.  Watch for lightheadedness or dizziness, especially when first standing in the morning.  No other change to blood pressure medicines at this time.  Recheck in the next 2 to 3 months.  Make sure you see me before you run out of your other blood pressure medications.  See information below on low back pain.  Some of that may be due to some wear and tear arthritis with the deconditioning from recent surgery.  Try heat, cool compresses, range of motion and  stretches as tolerated with Flexeril if needed.  If not improving in the next 1 to 2 weeks, or any worsening sooner, return to discuss further and possible imaging.  If you have lab work done today you will be contacted with your lab results within the next 2 weeks.  If you have not heard from [place] then please contact us. The fastest way to get your results is to register for My Chart.   IF you received an x-ray today, you will receive an invoice from [facility]. Please contact [facility] at [phone] with questions or concerns regarding your invoice.   IF you received labwork today, you will receive an invoice from [place]. Please contact [facility] at [phone] with questions or concerns regarding your invoice.   Our billing staff will not be able to assist you with questions regarding bills from these companies.  You will be contacted with the lab results as soon as they are available. The fastest way to get your results is to activate your My Chart account. Instructions are located on the last page of this paperwork. If you have not heard from [place] regarding the results in 2 weeks, please contact this office.       I personally performed the services described in this documentation, which was scribed in my presence. The recorded information has been reviewed and considered for accuracy and completeness, addended by me as needed, and agree with information above.  Signed,   Merri Ray, MD Primary Care  at [place].  03/01/18 3:06 PM

## 2018-03-02 LAB — BASIC METABOLIC PANEL
BUN/Creatinine Ratio: 17 (ref 9–20)
BUN: 17 mg/dL (ref 6–24)
CALCIUM: 9.3 mg/dL (ref 8.7–10.2)
CHLORIDE: 102 mmol/L (ref 96–106)
CO2: 20 mmol/L (ref 20–29)
Creatinine, Ser: 1 mg/dL (ref 0.76–1.27)
GFR calc non Af Amer: 90 mL/min/{1.73_m2} (ref >59)
GFR, EST AFRICAN AMERICAN: 104 mL/min/{1.73_m2} (ref >59)
Glucose: 85 mg/dL (ref 65–99)
Potassium: 3.9 mmol/L (ref 3.5–5.2)
Sodium: 139 mmol/L (ref 134–144)

## 2018-03-08 ENCOUNTER — Telehealth: Payer: Self-pay | Admitting: Internal Medicine

## 2018-03-08 NOTE — Telephone Encounter (Signed)
See notes in Care Everywhere from Dr, Sheryn Bison.  Patient's last dose of Stelara was due on his surgery date of 02/06/18.  Last dose was in 12/07/17.  Ok to resume?

## 2018-03-08 NOTE — Telephone Encounter (Signed)
OK to resume Stelara He should schedule a next avail f/u also

## 2018-03-11 NOTE — Telephone Encounter (Signed)
Patient notified to resume Stelara

## 2018-03-12 ENCOUNTER — Other Ambulatory Visit: Payer: Self-pay

## 2018-03-12 ENCOUNTER — Ambulatory Visit (INDEPENDENT_AMBULATORY_CARE_PROVIDER_SITE_OTHER): Payer: Managed Care, Other (non HMO) | Admitting: Physician Assistant

## 2018-03-12 ENCOUNTER — Encounter: Payer: Self-pay | Admitting: Physician Assistant

## 2018-03-12 VITALS — BP 130/86 | HR 115 | Temp 98.7°F | Resp 18 | Ht 72.0 in | Wt 325.2 lb

## 2018-03-12 DIAGNOSIS — R300 Dysuria: Secondary | ICD-10-CM | POA: Diagnosis not present

## 2018-03-12 DIAGNOSIS — R3989 Other symptoms and signs involving the genitourinary system: Secondary | ICD-10-CM | POA: Diagnosis not present

## 2018-03-12 LAB — POCT URINALYSIS DIP (MANUAL ENTRY)
GLUCOSE UA: NEGATIVE mg/dL
NITRITE UA: NEGATIVE
PH UA: 6 (ref 5.0–8.0)
Protein Ur, POC: 30 mg/dL — AB
Spec Grav, UA: 1.02 (ref 1.010–1.025)
UROBILINOGEN UA: 1 U/dL

## 2018-03-12 MED ORDER — DOXYCYCLINE HYCLATE 100 MG PO TABS: 100.0000 mg | ORAL_TABLET | Freq: Two times a day (BID) | ORAL | 0 refills | Status: DC

## 2018-03-12 NOTE — Progress Notes (Signed)
Craig Guzman  MRN: [id number] DOB: 29-May-1971  PCP: [name], MD  Chief Complaint  Patient presents with  . Dysuria    feels pressure and pain when he urinates x2-3days     Subjective:  Pt presents to clinic for urinary problems. He has bladder pressure and feeling like he has to go to the bathroom right after he is finished.  No dysuria.  No testicular swelling or pain but does have a different sensation during the urine stream. No penile discharge.  No change in color or smell of urine.  He has had UTI and kidney infection.  History is obtained by patient.  Review of Systems  Constitutional: Negative for chills and fever.  Gastrointestinal: Positive for abdominal pain (Suprapubic pressure). Negative for constipation, diarrhea and nausea.  Genitourinary: Positive for dysuria and urgency. Negative for discharge, scrotal swelling and testicular pain.    Patient Active Problem List   Diagnosis Date Noted  . Crohn's disease of small intestine with other complication ([place]) 04/18/5101  . Duodenal stricture   . Intractable vomiting with nausea   . Cigarette nicotine dependence without complication 58/52/7782  . Shifting sleep-work schedule, affecting sleep 05/15/2016  . Sleep deprivation 05/15/2016  . Sleep related hypoventilation in conditions classified elsewhere 05/15/2016  . Sleep related headaches 05/15/2016  . Morbid obesity ([place]) 09/19/2013  . Diarrhea 07/10/2013  . 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 Hypertension 11/26/2012  . Gastroparesis??? 12/08/2011  . Common migraine without aura 12/08/2011  . Long-term use of immunosuppressant medication 08/08/2011  . Gastroduodenal Crohn's disease with intestinal obstruction ([place]) 05/17/2011  . GERD (gastroesophageal reflux disease) 04/21/2011    Current Outpatient Medications on File Prior to Visit  Medication Sig Dispense Refill  . azelastine (ASTELIN) 0.1 % nasal spray PLACE 2 SPRAYS INTO BOTH NOSTRILS 2 (TWO)  TIMES DAILY. USE IN EACH NOSTRIL AS DIRECTED 30 mL 1  . cetirizine (ZYRTEC) 10 MG tablet TAKE ONE TABLET (10 MG DOSE) BY MOUTH DAILY.  0  . cyclobenzaprine (FLEXERIL) 5 MG tablet Take 1 tablet (5 mg total) by mouth 3 (three) times daily as needed for muscle spasms (start qhs prn due to sedation). 15 tablet 1  . doxazosin (CARDURA) 8 MG tablet Take 1 tablet (8 mg total) by mouth daily. 90 tablet 0  . lisinopril (PRINIVIL,ZESTRIL) 40 MG tablet Take 1 tablet (40 mg total) by mouth daily. Office visit needed for 90 day supply 90 tablet 1  . mercaptopurine (PURINETHOL) 50 MG tablet Take 1 tablet (50 mg total) by mouth daily. Give on an empty stomach 1 hour before or 2 hours after meals. 90 tablet 1  . omeprazole-sodium bicarbonate (ZEGERID) 40-1100 MG capsule TAKE 1 CAPSULE BY MOUTH AT BEDTIME. 90 capsule 0  . ondansetron (ZOFRAN-ODT) 4 MG disintegrating tablet TAKE 1 TABLET BY MOUTH EVERY 8 HOURS AS NEEDED FOR NAUSEA AND VOMITING 30 tablet 5  . prochlorperazine (COMPAZINE) 25 MG suppository Place 1 suppository (25 mg total) rectally every 12 (twelve) hours as needed for nausea or vomiting. 12 suppository 2  . rizatriptan (MAXALT-MLT) 10 MG disintegrating tablet TAKE 1 TABLET (10 MG TOTAL) BY MOUTH 3 (THREE) TIMES DAILY AS NEEDED FOR MIGRAINE. 9 tablet 3  . sucralfate (CARAFATE) 1 g tablet TAKE 1 TABLET (1 G TOTAL) BY MOUTH 4 (FOUR) TIMES DAILY - WITH MEALS AND AT BEDTIME. 360 tablet 2  . ustekinumab (STELARA) 90 MG/ML SOSY injection Inject 1 mL (90 mg total) into the skin every 8 (eight) weeks. 1  Syringe 2  . verapamil (CALAN-SR) 180 MG CR tablet Take 2 tablets (360 mg total) by mouth daily. 180 tablet 1  . polyethylene glycol powder (MIRALAX) powder Take 1 Container by mouth once. 238 grams for colon 7-25     No current facility-administered medications on file prior to visit.     Allergies  Allergen Reactions  . Zithromax [Azithromycin] Hypertension    Past Medical History:  Diagnosis Date  .  Allergy    trees/ grasses/ animals/dust/mold  . Anxiety   . Biliary dyskinesia   . Colloid thyroid nodule   . Crohn's disease ([place])    Stomach, terminal ileum, cecum  . Gastric ulcer    antral  . GERD (gastroesophageal reflux disease)   . History of migraine headaches   . HTN (hypertension)   . Kidney stone 09/20/2012  . Migraine   . Obesity   . Pneumonia 02/27/2014   ED  . Sleep apnea    has cpap- does not wear    Social History   Social History Narrative   Married. Education: [employer].    Lives at home w/ his wife and kids   Right-handed   Caffeine: 3 sodas per day   Social History   Tobacco Use  . Smoking status: Former Smoker    Packs/day: 0.75    Years: 21.00    Pack years: 15.75    Types: Cigarettes    Last attempt to quit: 12/01/2017    Years since quitting: 0.2  . Smokeless tobacco: Former Systems developer    Types: Snuff  . Tobacco comment: Counseling sheet given in exam room   Substance Use Topics  . Alcohol use: Yes    Alcohol/week: 0.0 standard drinks    Comment: occ  . Drug use: No   family history includes Asthma in his mother; Colon polyps in his father; Diabetes in his father; Heart disease in his maternal grandmother; Hypertension in his father and mother.     Objective:  BP 130/86   Pulse (!) 115   Temp 98.7 F (37.1 C) (Oral)   Resp 18   Ht 6' (1.829 m)   Wt (!) 325 lb 3.2 oz (147.5 kg)   SpO2 98%   BMI 44.11 kg/m  Body mass index is 44.11 kg/m.  Wt Readings from Last 3 Encounters:  03/12/18 (!) 325 lb 3.2 oz (147.5 kg)  03/01/18 (!) 320 lb (145.2 kg)  02/10/18 (!) 310 lb (140.6 kg)    Physical Exam  Constitutional: He is oriented to person, place, and time. He appears well-developed and well-nourished.  HENT:  Head: Normocephalic and atraumatic.  Right Ear: External ear normal.  Left Ear: External ear normal.  Eyes: Conjunctivae are normal.  Neck: Normal range of motion.  Cardiovascular: Normal rate, regular rhythm and normal heart  sounds.  No murmur heard. Pulmonary/Chest: Effort normal and breath sounds normal. He has no wheezes.  Abdominal: Soft. Bowel sounds are normal. There is tenderness. There is guarding. Hernia confirmed negative in the right inguinal area and confirmed negative in the left inguinal area.  Genitourinary: Testes normal and penis normal. Cremasteric reflex is present. Right testis shows no tenderness. Left testis shows no mass, no swelling and no tenderness. Circumcised. No hypospadias, penile erythema or penile tenderness. No discharge found.  Neurological: He is alert and oriented to person, place, and time.  Skin: Skin is warm and dry.  Psychiatric: Judgment normal.  Vitals reviewed.  Results for orders placed or performed in visit on  03/12/18  Urine Culture  Result Value Ref Range   Urine Culture, Routine Final report    Organism ID, Bacteria No growth   GC/Chlamydia Probe Amp  Result Value Ref Range   Chlamydia trachomatis, NAA Negative Negative   Neisseria gonorrhoeae by PCR Negative Negative  POCT urinalysis dipstick  Result Value Ref Range   Color, UA yellow yellow   Clarity, UA cloudy (A) clear   Glucose, UA negative negative mg/dL   Bilirubin, UA small (A) negative   Ketones, POC UA small (15) (A) negative mg/dL   Spec Grav, UA 1.020 1.010 - 1.025   Blood, UA small (A) negative   pH, UA 6.0 5.0 - 8.0   Protein Ur, POC =30 (A) negative mg/dL   Urobilinogen, UA 1.0 0.2 or 1.0 E.U./dL   Nitrite, UA Negative Negative   Leukocytes, UA Small (1+) (A) Negative    Assessment and Plan :  Dysuria - Plan: POCT urinalysis dipstick  Sensation of pressure in bladder area - Plan: Urine Culture, doxycycline (VIBRA-TABS) 100 MG tablet, GC/Chlamydia Probe Amp   Concern patient has prostatitis as his urine is relatively clear and his testicles have no tenderness palpation will cover with doxycycline due to age while waiting for URiprobe to return.  Patient was encouraged to hydrate and  warning signs were given to patient when to return to clinic.  Patient verbalized to me that they understand the following: diagnosis, what is being done for them, what to expect and what should be done at home.  Their questions have been answered.  See after visit summary for patient specific instructions.  Windell Hummingbird PA-C  Primary Care at [place] 03/19/2018 9:07 PM  Please note: Portions of this report may have been transcribed using dragon voice recognition software. Every effort was made to ensure accuracy; however, inadvertent computerized transcription errors may be present.

## 2018-03-12 NOTE — Patient Instructions (Signed)
° ° ° °  If you have lab work done today you will be contacted with your lab results within the next 2 weeks.  If you have not heard from us then please contact us. The fastest way to get your results is to register for My Chart. ° ° °IF you received an x-ray today, you will receive an invoice from [facility]. Please contact [facility] at [phone] with questions or concerns regarding your invoice.  ° °IF you received labwork today, you will receive an invoice from [facility]. Please contact [facility] at [phone] with questions or concerns regarding your invoice.  ° °Our billing staff will not be able to assist you with questions regarding bills from these companies. ° °You will be contacted with the lab results as soon as they are available. The fastest way to get your results is to activate your My Chart account. Instructions are located on the last page of this paperwork. If you have not heard from us regarding the results in 2 weeks, please contact this office. °  ° ° ° °

## 2018-03-13 ENCOUNTER — Ambulatory Visit: Payer: Managed Care, Other (non HMO) | Admitting: Physician Assistant

## 2018-03-13 LAB — URINE CULTURE: Organism ID, Bacteria: NO GROWTH

## 2018-03-14 LAB — GC/CHLAMYDIA PROBE AMP
Chlamydia trachomatis, NAA: NEGATIVE
NEISSERIA GONORRHOEAE BY PCR: NEGATIVE

## 2018-03-22 ENCOUNTER — Telehealth: Payer: Self-pay | Admitting: Internal Medicine

## 2018-03-22 DIAGNOSIS — N390 Urinary tract infection, site not specified: Secondary | ICD-10-CM

## 2018-03-22 DIAGNOSIS — R1031 Right lower quadrant pain: Secondary | ICD-10-CM

## 2018-03-22 NOTE — Telephone Encounter (Signed)
Patient report right sided abdominal pain.  RLQ same pain he had prior to the surgery.  Pain is improved with a BM but has a great deal of pain prior to a BM. Also is having back spasms and pain.  They would like your recommendations.  He is asking if needs you or primary care for this? . He did not start Humira due to a UTI then an upper resp infection.  Completed antibiotics today.

## 2018-03-22 NOTE — Telephone Encounter (Signed)
error 

## 2018-03-22 NOTE — Telephone Encounter (Signed)
Is he constipated?? Vomiting? Swollen/distended  He should come see me/us about  This I suspect but see what you can find out and let me know and I can call him later

## 2018-03-22 NOTE — Telephone Encounter (Signed)
Abdomen is tight.  One episode of vomiting last week Having regular BM Should he start Humira?  Finished antibiotics today

## 2018-03-23 NOTE — Telephone Encounter (Signed)
Spoke top wife  RLQ pain x days - maybe longer  Had UTI and was tx doxycycline - those sxs were different are gone  Not febrile Surgery has been a success for gastric problems  ++ fatigue Wife concerned about ability to return to work 10/9 start date   Plan  I ordered : 1) UA/Cx 2) CBC, CMET 3) 2 view abdomen  Will call results and plans  If severe sxs advised go to ED

## 2018-03-25 ENCOUNTER — Other Ambulatory Visit (INDEPENDENT_AMBULATORY_CARE_PROVIDER_SITE_OTHER): Payer: Managed Care, Other (non HMO)

## 2018-03-25 DIAGNOSIS — R1031 Right lower quadrant pain: Secondary | ICD-10-CM

## 2018-03-25 DIAGNOSIS — N390 Urinary tract infection, site not specified: Secondary | ICD-10-CM

## 2018-03-25 LAB — URINALYSIS, ROUTINE W REFLEX MICROSCOPIC
Bilirubin Urine: NEGATIVE
HGB URINE DIPSTICK: NEGATIVE
Ketones, ur: NEGATIVE
Leukocytes, UA: NEGATIVE
NITRITE: NEGATIVE
RBC / HPF: NONE SEEN (ref 0–?)
Specific Gravity, Urine: 1.02 (ref 1.000–1.030)
Total Protein, Urine: NEGATIVE
URINE GLUCOSE: NEGATIVE
Urobilinogen, UA: 1 (ref 0.0–1.0)
pH: 6.5 (ref 5.0–8.0)

## 2018-03-25 LAB — CBC WITH DIFFERENTIAL/PLATELET
BASOS PCT: 0.4 % (ref 0.0–3.0)
Basophils Absolute: 0 10*3/uL (ref 0.0–0.1)
Eosinophils Absolute: 0.3 10*3/uL (ref 0.0–0.7)
Eosinophils Relative: 5 % (ref 0.0–5.0)
HEMATOCRIT: 42.7 % (ref 39.0–52.0)
HEMOGLOBIN: 14.3 g/dL (ref 13.0–17.0)
LYMPHS PCT: 44.8 % (ref 12.0–46.0)
Lymphs Abs: 2.4 10*3/uL (ref 0.7–4.0)
MCHC: 33.6 g/dL (ref 30.0–36.0)
MCV: 82 fl (ref 78.0–100.0)
MONO ABS: 0.7 10*3/uL (ref 0.1–1.0)
MONOS PCT: 14.1 % — AB (ref 3.0–12.0)
Neutro Abs: 1.9 10*3/uL (ref 1.4–7.7)
Neutrophils Relative %: 35.7 % — ABNORMAL LOW (ref 43.0–77.0)
Platelets: 217 10*3/uL (ref 150.0–400.0)
RBC: 5.2 Mil/uL (ref 4.22–5.81)
RDW: 13.1 % (ref 11.5–15.5)
WBC: 5.3 10*3/uL (ref 4.0–10.5)

## 2018-03-25 LAB — COMPREHENSIVE METABOLIC PANEL
ALBUMIN: 3.9 g/dL (ref 3.5–5.2)
ALK PHOS: 121 U/L — AB (ref 39–117)
ALT: 23 U/L (ref 0–53)
AST: 19 U/L (ref 0–37)
BUN: 12 mg/dL (ref 6–23)
CHLORIDE: 105 meq/L (ref 96–112)
CO2: 25 mEq/L (ref 19–32)
Calcium: 9 mg/dL (ref 8.4–10.5)
Creatinine, Ser: 0.88 mg/dL (ref 0.40–1.50)
GFR: 119.78 mL/min (ref >60.00)
Glucose, Bld: 107 mg/dL — ABNORMAL HIGH (ref 70–99)
Potassium: 3.4 mEq/L — ABNORMAL LOW (ref 3.5–5.1)
Sodium: 138 mEq/L (ref 135–145)
TOTAL PROTEIN: 7.9 g/dL (ref 6.0–8.3)
Total Bilirubin: 0.4 mg/dL (ref 0.2–1.2)

## 2018-03-26 NOTE — Progress Notes (Signed)
Labs look ok - minor abnormalities only so far  Urine cx pending  My Chart message

## 2018-03-27 LAB — URINE CULTURE
MICRO NUMBER:: 91139604
RESULT: NO GROWTH
SPECIMEN QUALITY: ADEQUATE

## 2018-03-29 NOTE — Progress Notes (Signed)
Neg Urine xcx

## 2018-04-02 ENCOUNTER — Telehealth: Payer: Self-pay | Admitting: Internal Medicine

## 2018-04-02 NOTE — Telephone Encounter (Signed)
Form filled out and faxed to [place].  I mailed him the original.

## 2018-04-09 ENCOUNTER — Ambulatory Visit: Payer: Managed Care, Other (non HMO) | Admitting: Internal Medicine

## 2018-04-12 ENCOUNTER — Ambulatory Visit (INDEPENDENT_AMBULATORY_CARE_PROVIDER_SITE_OTHER): Payer: Managed Care, Other (non HMO) | Admitting: Emergency Medicine

## 2018-04-12 ENCOUNTER — Encounter: Payer: Self-pay | Admitting: Emergency Medicine

## 2018-04-12 ENCOUNTER — Other Ambulatory Visit: Payer: Self-pay

## 2018-04-12 VITALS — BP 146/86 | HR 80 | Temp 98.0°F | Resp 16 | Wt 335.0 lb

## 2018-04-12 DIAGNOSIS — Z23 Encounter for immunization: Secondary | ICD-10-CM | POA: Diagnosis not present

## 2018-04-12 DIAGNOSIS — M545 Low back pain, unspecified: Secondary | ICD-10-CM

## 2018-04-12 DIAGNOSIS — M62838 Other muscle spasm: Secondary | ICD-10-CM | POA: Diagnosis not present

## 2018-04-12 MED ORDER — CYCLOBENZAPRINE HCL 10 MG PO TABS: 10.0000 mg | ORAL_TABLET | Freq: Three times a day (TID) | ORAL | 0 refills | Status: DC | PRN

## 2018-04-12 MED ORDER — HYDROCODONE-ACETAMINOPHEN 5-325 MG PO TABS: 1.0000 | ORAL_TABLET | Freq: Four times a day (QID) | ORAL | 0 refills | Status: DC | PRN

## 2018-04-12 NOTE — Progress Notes (Signed)
Craig Guzman 47 y.o.   Chief Complaint  Patient presents with  . Back Pain    with spasms x 3 days     HISTORY OF PRESENT ILLNESS: This is a 47 y.o. male with history of Crohn's disease.  Complaining of low back pain for 3 days.  No injuries.  No associated symptoms.  Back Pain  This is a new problem. The current episode started in the past 7 days. The problem occurs constantly. The problem has been gradually worsening since onset. The pain is present in the lumbar spine. The quality of the pain is described as aching. The pain does not radiate. The pain is at a severity of 7/10. The pain is moderate. The symptoms are aggravated by bending, position, standing and twisting. Stiffness is present all day. Pertinent negatives include no abdominal pain, bladder incontinence, bowel incontinence, chest pain, dysuria, fever, headaches, leg pain, numbness, paresis, paresthesias, pelvic pain, perianal numbness, tingling, weakness or weight loss. Risk factors: none. He has tried nothing for the symptoms.     Prior to Admission medications   Medication Sig Start Date End Date Taking? Authorizing Provider  azelastine (ASTELIN) 0.1 % nasal spray PLACE 2 SPRAYS INTO BOTH NOSTRILS 2 (TWO) TIMES DAILY. USE IN EACH NOSTRIL AS DIRECTED 01/04/18  Yes [name], MD  cetirizine (ZYRTEC) 10 MG tablet TAKE ONE TABLET (10 MG DOSE) BY MOUTH DAILY. 11/30/17  Yes [provider]  cyclobenzaprine (FLEXERIL) 5 MG tablet Take 1 tablet (5 mg total) by mouth 3 (three) times daily as needed for muscle spasms (start qhs prn due to sedation). 03/01/18  Yes [name], MD  doxazosin (CARDURA) 8 MG tablet Take 1 tablet (8 mg total) by mouth daily. 03/01/18  Yes [name], MD  doxycycline (VIBRA-TABS) 100 MG tablet Take 1 tablet (100 mg total) by mouth 2 (two) times daily. 03/12/18  Yes Weber, Sarah L, PA-C  lisinopril (PRINIVIL,ZESTRIL) 40 MG tablet Take 1 tablet (40 mg total) by mouth daily. Office  visit needed for 90 day supply 11/08/17  Yes [name], MD  mercaptopurine (PURINETHOL) 50 MG tablet Take 1 tablet (50 mg total) by mouth daily. Give on an empty stomach 1 hour before or 2 hours after meals. 05/02/17  Yes Gatha Mayer, MD  omeprazole-sodium bicarbonate (ZEGERID) 40-1100 MG capsule TAKE 1 CAPSULE BY MOUTH AT BEDTIME. 04/25/17  Yes [name], MD  ondansetron (ZOFRAN-ODT) 4 MG disintegrating tablet TAKE 1 TABLET BY MOUTH EVERY 8 HOURS AS NEEDED FOR NAUSEA AND VOMITING 01/15/18  Yes Gatha Mayer, MD  polyethylene glycol powder (MIRALAX) powder Take 1 Container by mouth once. 238 grams for colon 7-25   Yes [provider]  prochlorperazine (COMPAZINE) 25 MG suppository Place 1 suppository (25 mg total) rectally every 12 (twelve) hours as needed for nausea or vomiting. 03/19/17  Yes Gatha Mayer, MD  rizatriptan (MAXALT-MLT) 10 MG disintegrating tablet TAKE 1 TABLET (10 MG TOTAL) BY MOUTH 3 (THREE) TIMES DAILY AS NEEDED FOR MIGRAINE. 05/09/17  Yes Kathrynn Ducking, MD  sucralfate (CARAFATE) 1 g tablet TAKE 1 TABLET (1 G TOTAL) BY MOUTH 4 (FOUR) TIMES DAILY - WITH MEALS AND AT BEDTIME. 01/25/18  Yes Gatha Mayer, MD  ustekinumab (STELARA) 90 MG/ML SOSY injection Inject 1 mL (90 mg total) into the skin every 8 (eight) weeks. 07/25/17  Yes Gatha Mayer, MD  verapamil (CALAN-SR) 180 MG CR tablet Take 2 tablets (360 mg total) by mouth  daily. 11/08/17  Yes [name], MD    Allergies  Allergen Reactions  . Zithromax [Azithromycin] Hypertension    Patient Active Problem List   Diagnosis Date Noted  . Crohn's disease of small intestine with other complication ([place]) 93/23/5573  . Duodenal stricture   . Intractable vomiting with nausea   . Cigarette nicotine dependence without complication 22/08/5425  . Shifting sleep-work schedule, affecting sleep 05/15/2016  . Sleep deprivation 05/15/2016  . Sleep related hypoventilation in conditions classified  elsewhere 05/15/2016  . Sleep related headaches 05/15/2016  . Morbid obesity ([place]) 09/19/2013  . Diarrhea 07/10/2013  . 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 Hypertension 11/26/2012  . Gastroparesis??? 12/08/2011  . Common migraine without aura 12/08/2011  . Long-term use of immunosuppressant medication 08/08/2011  . Gastroduodenal Crohn's disease with intestinal obstruction ([place]) 05/17/2011  . GERD (gastroesophageal reflux disease) 04/21/2011    Past Medical History:  Diagnosis Date  . Allergy    trees/ grasses/ animals/dust/mold  . Anxiety   . Biliary dyskinesia   . Colloid thyroid nodule   . Crohn's disease ([place])    Stomach, terminal ileum, cecum  . Gastric ulcer    antral  . GERD (gastroesophageal reflux disease)   . History of migraine headaches   . HTN (hypertension)   . Kidney stone 09/20/2012  . Migraine   . Obesity   . Pneumonia 02/27/2014   ED  . Sleep apnea    has cpap- does not wear     Past Surgical History:  Procedure Laterality Date  . BALLOON DILATION N/A 12/06/2017   Procedure: BALLOON DILATION;  Surgeon: Gatha Mayer, MD;  Location: [facility];  Service: Endoscopy;  Laterality: N/A;  . CHOLECYSTECTOMY  2011   [place]  . COLONOSCOPY  07/26/11   Crohn's colitis, ileitis  . ESOPHAGOGASTRODUODENOSCOPY  05/04/11, 07/26/11   granulomatous gastritis - Crohn's  . ESOPHAGOGASTRODUODENOSCOPY (EGD) WITH PROPOFOL N/A 03/28/2017   Procedure: ESOPHAGOGASTRODUODENOSCOPY (EGD) WITH PROPOFOL  with balloon dilation of duodenum;  Surgeon: [name], MD;  Location: [facility];  Service: Gastroenterology;  Laterality: N/A;  . ESOPHAGOGASTRODUODENOSCOPY (EGD) WITH PROPOFOL N/A 12/06/2017   Procedure: ESOPHAGOGASTRODUODENOSCOPY (EGD) WITH PROPOFOL;  Surgeon: Gatha Mayer, MD;  Location: [facility];  Service: Endoscopy;  Laterality: N/A;  . FLEXIBLE SIGMOIDOSCOPY    . MULTIPLE TOOTH EXTRACTIONS  2005  . SHOULDER SURGERY     right  . UPPER  GASTROINTESTINAL ENDOSCOPY    . VASECTOMY     bilateral w/lysis of penile adhesions    Social History   Socioeconomic History  . Marital status: Married    Spouse name: Not on file  . Number of children: 3  . Years of education: Some college  . Highest education level: Not on file  Occupational History  . Occupation: [employer]  Social Needs  . Financial resource strain: Not on file  . Food insecurity:    Worry: Not on file    Inability: Not on file  . Transportation needs:    Medical: Not on file    Non-medical: Not on file  Tobacco Use  . Smoking status: Former Smoker    Packs/day: 0.75    Years: 21.00    Pack years: 15.75    Types: Cigarettes    Last attempt to quit: 12/01/2017    Years since quitting: 0.3  . Smokeless tobacco: Former Systems developer    Types: Snuff  . Tobacco comment: Counseling sheet given in exam room   Substance and  Sexual Activity  . Alcohol use: Yes    Alcohol/week: 0.0 standard drinks    Comment: occ  . Drug use: No  . Sexual activity: Yes    Partners: Female  Lifestyle  . Physical activity:    Days per week: Not on file    Minutes per session: Not on file  . Stress: Not on file  Relationships  . Social connections:    Talks on phone: Not on file    Gets together: Not on file    Attends religious service: Not on file    Active member of club or organization: Not on file    Attends meetings of clubs or organizations: Not on file    Relationship status: Not on file  . Intimate partner violence:    Fear of current or ex partner: Not on file    Emotionally abused: Not on file    Physically abused: Not on file    Forced sexual activity: Not on file  Other Topics Concern  . Not on file  Social History Narrative   Married. Education: [employer].    Lives at home w/ his wife and kids   Right-handed   Caffeine: 3 sodas per day    Family History  Problem Relation Age of Onset  . Colon polyps Father   . Diabetes Father   . Hypertension Father   .  Hypertension Mother   . Asthma Mother   . Heart disease Maternal Grandmother   . Colon cancer Neg Hx   . Rectal cancer Neg Hx   . Stomach cancer Neg Hx   . Esophageal cancer Neg Hx      Review of Systems  Constitutional: Negative for fever and weight loss.  HENT: Negative.   Eyes: Negative.   Respiratory: Negative.  Negative for cough and shortness of breath.   Cardiovascular: Negative for chest pain and palpitations.  Gastrointestinal: Negative for abdominal pain, bowel incontinence, diarrhea, nausea and vomiting.  Genitourinary: Negative.  Negative for bladder incontinence, dysuria, hematuria and pelvic pain.  Musculoskeletal: Positive for back pain.  Skin: Negative.  Negative for rash.  Neurological: Negative.  Negative for tingling, weakness, numbness, headaches and paresthesias.  Endo/Heme/Allergies: Negative.   All other systems reviewed and are negative.   Vitals:   04/12/18 1649  BP: (!) 146/86  Pulse: 80  Resp: 16  Temp: 98 F (36.7 C)  SpO2: 97%    Physical Exam  Constitutional: He is oriented to person, place, and time. He appears well-developed.  Obese  HENT:  Head: Normocephalic and atraumatic.  Eyes: Pupils are equal, round, and reactive to light. Conjunctivae and EOM are normal.  Neck: Normal range of motion.  Cardiovascular: Normal rate and regular rhythm.  Pulmonary/Chest: Effort normal and breath sounds normal.  Abdominal: Soft. There is no tenderness.  Musculoskeletal:       Lumbar back: He exhibits decreased range of motion, tenderness and spasm. He exhibits no bony tenderness.  Neurological: He is alert and oriented to person, place, and time. No sensory deficit. He exhibits normal muscle tone.  Skin: Skin is warm and dry. Capillary refill takes less than 2 seconds.  Psychiatric: He has a normal mood and affect. His behavior is normal.  Vitals reviewed.    ASSESSMENT & PLAN: Craig Guzman was seen today for back pain.  Diagnoses and all orders for  this visit:  Lumbar pain -     HYDROcodone-acetaminophen (NORCO) 5-325 MG tablet; Take 1 tablet by mouth every 6 (six)  hours as needed for moderate pain.  Muscle spasm -     cyclobenzaprine (FLEXERIL) 10 MG tablet; Take 1 tablet (10 mg total) by mouth 3 (three) times daily as needed for muscle spasms.  Need for prophylactic vaccination and inoculation against influenza -     Flu Vaccine QUAD 36+ mos IM    Patient Instructions       If you have lab work done today you will be contacted with your lab results within the next 2 weeks.  If you have not heard from [place] then please contact us. The fastest way to get your results is to register for My Chart.   IF you received an x-ray today, you will receive an invoice from [facility]. Please contact [facility] at [phone] with questions or concerns regarding your invoice.   IF you received labwork today, you will receive an invoice from [facility]. Please contact [facility] at [phone] with questions or concerns regarding your invoice.   Our billing staff will not be able to assist you with questions regarding bills from these companies.  You will be contacted with the lab results as soon as they are available. The fastest way to get your results is to activate your My Chart account. Instructions are located on the last page of this paperwork. If you have not heard from [place] regarding the results in 2 weeks, please contact this office.      Back Pain, Adult Back pain is very common. The pain often gets better over time. The cause of back pain is usually not dangerous. Most people can learn to manage their back pain on their own. Follow these instructions at home: Watch your back pain for any changes. The following actions may help to lessen any pain you are feeling:  Stay active. Start with short walks on flat ground if you can. Try to walk farther each day.  Exercise regularly as told by your doctor. Exercise  helps your back heal faster. It also helps avoid future injury by keeping your muscles strong and flexible.  Do not sit, drive, or stand in one place for more than 30 minutes.  Do not stay in bed. Resting more than 1-2 days can slow down your recovery.  Be careful when you bend or lift an object. Use good form when lifting: ? Bend at your knees. ? Keep the object close to your body. ? Do not twist.  Sleep on a firm mattress. Lie on your side, and bend your knees. If you lie on your back, put a pillow under your knees.  Take medicines only as told by your doctor.  Put ice on the injured area. ? Put ice in a plastic bag. ? Place a towel between your skin and the bag. ? Leave the ice on for 20 minutes, 2-3 times a day for the first 2-3 days. After that, you can switch between ice and heat packs.  Avoid feeling anxious or stressed. Find good ways to deal with stress, such as exercise.  Maintain a healthy weight. Extra weight puts stress on your back.  Contact a doctor if:  You have pain that does not go away with rest or medicine.  You have worsening pain that goes down into your legs or buttocks.  You have pain that does not get better in one week.  You have pain at night.  You lose weight.  You have a fever or chills. Get help right away if:  You cannot control  when you poop (bowel movement) or pee (urinate).  Your arms or legs feel weak.  Your arms or legs lose feeling (numbness).  You feel sick to your stomach (nauseous) or throw up (vomit).  You have belly (abdominal) pain.  You feel like you may pass out (faint). This information is not intended to replace advice given to you by your health care provider. Make sure you discuss any questions you have with your health care provider. Document Released: 12/06/2007 Document Revised: 11/25/2015 Document Reviewed: 10/21/2013 Elsevier Interactive Patient Education  2018 Elsevier Inc.      Agustina Caroli,  MD [facility]

## 2018-04-12 NOTE — Patient Instructions (Addendum)
   If you have lab work done today you will be contacted with your lab results within the next 2 weeks.  If you have not heard from us then please contact us. The fastest way to get your results is to register for My Chart.   IF you received an x-ray today, you will receive an invoice from [facility]. Please contact [facility] at [phone] with questions or concerns regarding your invoice.   IF you received labwork today, you will receive an invoice from [facility]. Please contact [facility] at [phone] with questions or concerns regarding your invoice.   Our billing staff will not be able to assist you with questions regarding bills from these companies.  You will be contacted with the lab results as soon as they are available. The fastest way to get your results is to activate your My Chart account. Instructions are located on the last page of this paperwork. If you have not heard from us regarding the results in 2 weeks, please contact this office.     Back Pain, Adult Back pain is very common. The pain often gets better over time. The cause of back pain is usually not dangerous. Most people can learn to manage their back pain on their own. Follow these instructions at home: Watch your back pain for any changes. The following actions may help to lessen any pain you are feeling:  Stay active. Start with short walks on flat ground if you can. Try to walk farther each day.  Exercise regularly as told by your doctor. Exercise helps your back heal faster. It also helps avoid future injury by keeping your muscles strong and flexible.  Do not sit, drive, or stand in one place for more than 30 minutes.  Do not stay in bed. Resting more than 1-2 days can slow down your recovery.  Be careful when you bend or lift an object. Use good form when lifting: ? Bend at your knees. ? Keep the object close to your body. ? Do not twist.  Sleep on a firm mattress. Lie on your  side, and bend your knees. If you lie on your back, put a pillow under your knees.  Take medicines only as told by your doctor.  Put ice on the injured area. ? Put ice in a plastic bag. ? Place a towel between your skin and the bag. ? Leave the ice on for 20 minutes, 2-3 times a day for the first 2-3 days. After that, you can switch between ice and heat packs.  Avoid feeling anxious or stressed. Find good ways to deal with stress, such as exercise.  Maintain a healthy weight. Extra weight puts stress on your back.  Contact a doctor if:  You have pain that does not go away with rest or medicine.  You have worsening pain that goes down into your legs or buttocks.  You have pain that does not get better in one week.  You have pain at night.  You lose weight.  You have a fever or chills. Get help right away if:  You cannot control when you poop (bowel movement) or pee (urinate).  Your arms or legs feel weak.  Your arms or legs lose feeling (numbness).  You feel sick to your stomach (nauseous) or throw up (vomit).  You have belly (abdominal) pain.  You feel like you may pass out (faint). This information is not intended to replace advice given to you by your health care   provider. Make sure you discuss any questions you have with your health care provider. Document Released: 12/06/2007 Document Revised: 11/25/2015 Document Reviewed: 10/21/2013 Elsevier Interactive Patient Education  2018 Elsevier Inc.  

## 2018-04-19 ENCOUNTER — Encounter: Payer: Self-pay | Admitting: Family Medicine

## 2018-04-19 ENCOUNTER — Observation Stay (HOSPITAL_COMMUNITY)
Admission: EM | Admit: 2018-04-19 | Discharge: 2018-04-21 | Disposition: A | Discharge status: Home / Self Care | Payer: Managed Care, Other (non HMO) | Attending: Internal Medicine | Admitting: Internal Medicine

## 2018-04-19 ENCOUNTER — Ambulatory Visit (INDEPENDENT_AMBULATORY_CARE_PROVIDER_SITE_OTHER): Payer: Managed Care, Other (non HMO) | Admitting: Family Medicine

## 2018-04-19 ENCOUNTER — Other Ambulatory Visit: Payer: Self-pay

## 2018-04-19 ENCOUNTER — Emergency Department (HOSPITAL_COMMUNITY): Payer: Managed Care, Other (non HMO)

## 2018-04-19 ENCOUNTER — Encounter (HOSPITAL_COMMUNITY): Payer: Self-pay | Admitting: Emergency Medicine

## 2018-04-19 VITALS — BP 142/99 | HR 83 | Temp 97.6°F | Resp 18 | Ht 72.0 in | Wt 339.0 lb

## 2018-04-19 DIAGNOSIS — G43009 Migraine without aura, not intractable, without status migrainosus: Secondary | ICD-10-CM | POA: Diagnosis present

## 2018-04-19 DIAGNOSIS — G4733 Obstructive sleep apnea (adult) (pediatric): Secondary | ICD-10-CM | POA: Diagnosis not present

## 2018-04-19 DIAGNOSIS — Z91048 Other nonmedicinal substance allergy status: Secondary | ICD-10-CM | POA: Insufficient documentation

## 2018-04-19 DIAGNOSIS — R739 Hyperglycemia, unspecified: Secondary | ICD-10-CM | POA: Diagnosis not present

## 2018-04-19 DIAGNOSIS — Z79899 Other long term (current) drug therapy: Secondary | ICD-10-CM | POA: Diagnosis not present

## 2018-04-19 DIAGNOSIS — K140 Glossitis: Secondary | ICD-10-CM

## 2018-04-19 DIAGNOSIS — R682 Dry mouth, unspecified: Secondary | ICD-10-CM

## 2018-04-19 DIAGNOSIS — Z87891 Personal history of nicotine dependence: Secondary | ICD-10-CM | POA: Diagnosis not present

## 2018-04-19 DIAGNOSIS — K1379 Other lesions of oral mucosa: Secondary | ICD-10-CM

## 2018-04-19 DIAGNOSIS — R1310 Dysphagia, unspecified: Secondary | ICD-10-CM

## 2018-04-19 DIAGNOSIS — R631 Polydipsia: Secondary | ICD-10-CM

## 2018-04-19 DIAGNOSIS — T783XXA Angioneurotic edema, initial encounter: Secondary | ICD-10-CM | POA: Diagnosis not present

## 2018-04-19 DIAGNOSIS — K50018 Crohn's disease of small intestine with other complication: Secondary | ICD-10-CM | POA: Insufficient documentation

## 2018-04-19 DIAGNOSIS — X58XXXA Exposure to other specified factors, initial encounter: Secondary | ICD-10-CM | POA: Diagnosis not present

## 2018-04-19 DIAGNOSIS — I10 Essential (primary) hypertension: Secondary | ICD-10-CM | POA: Diagnosis not present

## 2018-04-19 DIAGNOSIS — E876 Hypokalemia: Secondary | ICD-10-CM

## 2018-04-19 LAB — BASIC METABOLIC PANEL
ANION GAP: 7 (ref 5–15)
BUN: 11 mg/dL (ref 6–20)
CALCIUM: 8.6 mg/dL — AB (ref 8.9–10.3)
CO2: 25 mmol/L (ref 22–32)
Chloride: 107 mmol/L (ref 98–111)
Creatinine, Ser: 1.02 mg/dL (ref 0.61–1.24)
Glucose, Bld: 103 mg/dL — ABNORMAL HIGH (ref 70–99)
POTASSIUM: 3.4 mmol/L — AB (ref 3.5–5.1)
SODIUM: 139 mmol/L (ref 135–145)

## 2018-04-19 LAB — CBC WITH DIFFERENTIAL/PLATELET
ABS IMMATURE GRANULOCYTES: 0.01 10*3/uL (ref 0.00–0.07)
Basophils Absolute: 0 10*3/uL (ref 0.0–0.1)
Basophils Relative: 0 %
EOS ABS: 0.2 10*3/uL (ref 0.0–0.5)
Eosinophils Relative: 5 %
HCT: 42.8 % (ref 39.0–52.0)
Hemoglobin: 14 g/dL (ref 13.0–17.0)
IMMATURE GRANULOCYTES: 0 %
Lymphocytes Relative: 40 %
Lymphs Abs: 1.8 10*3/uL (ref 0.7–4.0)
MCH: 27.6 pg (ref 26.0–34.0)
MCHC: 32.7 g/dL (ref 30.0–36.0)
MCV: 84.3 fL (ref 80.0–100.0)
Monocytes Absolute: 0.7 10*3/uL (ref 0.1–1.0)
Monocytes Relative: 15 %
NEUTROS ABS: 1.8 10*3/uL (ref 1.7–7.7)
NEUTROS PCT: 40 %
PLATELETS: 205 10*3/uL (ref 150–400)
RBC: 5.08 MIL/uL (ref 4.22–5.81)
RDW: 13.7 % (ref 11.5–15.5)
WBC: 4.6 10*3/uL (ref 4.0–10.5)
nRBC: 0 % (ref 0.0–0.2)

## 2018-04-19 LAB — GLUCOSE, POCT (MANUAL RESULT ENTRY): POC Glucose: 80 mg/dl (ref 70–99)

## 2018-04-19 LAB — GROUP A STREP BY PCR: GROUP A STREP BY PCR: NOT DETECTED

## 2018-04-19 LAB — POCT SKIN KOH: Skin KOH, POC: NEGATIVE

## 2018-04-19 MED ORDER — CYCLOBENZAPRINE HCL 10 MG PO TABS
10.0000 mg | ORAL_TABLET | Freq: Once | ORAL | Status: AC
  Administered 2018-04-20: 10 mg via ORAL
  Filled 2018-04-19: qty 1

## 2018-04-19 MED ORDER — SUMATRIPTAN SUCCINATE 50 MG PO TABS
50.0000 mg | ORAL_TABLET | Freq: Once | ORAL | Status: AC
  Administered 2018-04-20: 50 mg via ORAL
  Filled 2018-04-19: qty 1

## 2018-04-19 MED ORDER — METHYLPREDNISOLONE SODIUM SUCC 125 MG IJ SOLR
125.0000 mg | Freq: Once | INTRAMUSCULAR | Status: AC
  Administered 2018-04-19: 125 mg via INTRAVENOUS
  Filled 2018-04-19: qty 2

## 2018-04-19 MED ORDER — FAMOTIDINE IN NACL 20-0.9 MG/50ML-% IV SOLN
20.0000 mg | Freq: Once | INTRAVENOUS | Status: AC
  Administered 2018-04-19: 20 mg via INTRAVENOUS
  Filled 2018-04-19: qty 50

## 2018-04-19 MED ORDER — CLOTRIMAZOLE 10 MG MT TROC
10.0000 mg | Freq: Every day | OROMUCOSAL | 0 refills | Status: DC
Start: 2018-04-19 — End: 2018-04-26

## 2018-04-19 MED ORDER — IOHEXOL 300 MG/ML  SOLN
75.0000 mL | Freq: Once | INTRAMUSCULAR | Status: AC | PRN
  Administered 2018-04-19: 75 mL via INTRAVENOUS

## 2018-04-19 MED ORDER — ACETAMINOPHEN 500 MG PO TABS
1000.0000 mg | ORAL_TABLET | Freq: Once | ORAL | Status: AC
  Administered 2018-04-19: 1000 mg via ORAL
  Filled 2018-04-19: qty 2

## 2018-04-19 MED ORDER — DIPHENHYDRAMINE HCL 50 MG/ML IJ SOLN
25.0000 mg | Freq: Once | INTRAMUSCULAR | Status: AC
  Administered 2018-04-19: 25 mg via INTRAVENOUS
  Filled 2018-04-19: qty 1

## 2018-04-19 NOTE — H&P (Signed)
History and Physical    Craig Guzman WEX:[id number] DOB: 1970-10-01 DOA: 04/19/2018  PCP: [name], MD  Patient coming from: home by way of pcp's office   Chief Complaint: dry mouth and trouble breathign  HPI: Craig Guzman is a 47 y.o. male with medical history significant for obesity, crohn's disease, htn, osa not on cpap, former smoker, who presents with above.  Was in usual state of health until awoke this morning. No recent fever, cough, throat pain, sick contacts. When awoke had sensation of dry mouth. Noticed papillae center of tongue seemed smoother than before. Dry mouth sensation persists. Developed "a lump in my throat," an unpleaseant sensation when swallowing. Developed mild wheezing as well. No lip or other swelling. No recent changes in meds. Has been on lisinopril at least a decade. No voice change. Denies dry eyes.   Also complains of having what he describes as typical migraine headache, 7/10, pulsating squeezing anterior head pain. No focal weakness or numbness.  ED Course: benadryl, methylpred, ct, labs  Review of Systems: As per HPI otherwise 10 point review of systems negative.    Past Medical History:  Diagnosis Date  . Allergy    trees/ grasses/ animals/dust/mold  . Anxiety   . Biliary dyskinesia   . Colloid thyroid nodule   . Crohn's disease ([place])    Stomach, terminal ileum, cecum  . Gastric ulcer    antral  . GERD (gastroesophageal reflux disease)   . History of migraine headaches   . HTN (hypertension)   . Kidney stone 09/20/2012  . Migraine   . Obesity   . Pneumonia 02/27/2014   ED  . Sleep apnea    has cpap- does not wear     Past Surgical History:  Procedure Laterality Date  . BALLOON DILATION N/A 12/06/2017   Procedure: BALLOON DILATION;  Surgeon: Gatha Mayer, MD;  Location: [facility];  Service: Endoscopy;  Laterality: N/A;  . CHOLECYSTECTOMY  2011   [place]  . COLONOSCOPY  07/26/11   Crohn's colitis, ileitis  .  ESOPHAGOGASTRODUODENOSCOPY  05/04/11, 07/26/11   granulomatous gastritis - Crohn's  . ESOPHAGOGASTRODUODENOSCOPY (EGD) WITH PROPOFOL N/A 03/28/2017   Procedure: ESOPHAGOGASTRODUODENOSCOPY (EGD) WITH PROPOFOL  with balloon dilation of duodenum;  Surgeon: [name], MD;  Location: [facility];  Service: Gastroenterology;  Laterality: N/A;  . ESOPHAGOGASTRODUODENOSCOPY (EGD) WITH PROPOFOL N/A 12/06/2017   Procedure: ESOPHAGOGASTRODUODENOSCOPY (EGD) WITH PROPOFOL;  Surgeon: Gatha Mayer, MD;  Location: [facility];  Service: Endoscopy;  Laterality: N/A;  . FLEXIBLE SIGMOIDOSCOPY    . MULTIPLE TOOTH EXTRACTIONS  2005  . SHOULDER SURGERY     right  . UPPER GASTROINTESTINAL ENDOSCOPY    . VASECTOMY     bilateral w/lysis of penile adhesions     reports that he quit smoking about 4 months ago. His smoking use included cigarettes. He has a 15.75 pack-year smoking history. He has quit using smokeless tobacco.  His smokeless tobacco use included snuff. He reports that he drinks alcohol. He reports that he does not use drugs.  Allergies  Allergen Reactions  . Zithromax [Azithromycin] Hypertension    Family History  Problem Relation Age of Onset  . Colon polyps Father   . Diabetes Father   . Hypertension Father   . Hypertension Mother   . Asthma Mother   . Heart disease Maternal Grandmother   . Colon cancer Neg Hx   . Rectal cancer Neg Hx   . Stomach cancer Neg Hx   .  Esophageal cancer Neg Hx     Prior to Admission medications   Medication Sig Start Date End Date Taking? Authorizing Provider  cyclobenzaprine (FLEXERIL) 10 MG tablet Take 10 mg by mouth 3 (three) times daily as needed for muscle spasms.   Yes [provider]  doxazosin (CARDURA) 8 MG tablet Take 1 tablet (8 mg total) by mouth daily. 03/01/18  Yes [name], MD  lisinopril (PRINIVIL,ZESTRIL) 40 MG tablet Take 1 tablet (40 mg total) by mouth daily. Office visit needed for 90 day supply 11/08/17  Yes  [name], MD  mercaptopurine (PURINETHOL) 50 MG tablet Take 1 tablet (50 mg total) by mouth daily. Give on an empty stomach 1 hour before or 2 hours after meals. 05/02/17  Yes Gatha Mayer, MD  omeprazole-sodium bicarbonate (ZEGERID) 40-1100 MG capsule TAKE 1 CAPSULE BY MOUTH AT BEDTIME. Patient taking differently: Take 1 capsule by mouth at bedtime as needed (indigestion).  04/25/17  Yes [name], MD  ondansetron (ZOFRAN-ODT) 4 MG disintegrating tablet TAKE 1 TABLET BY MOUTH EVERY 8 HOURS AS NEEDED FOR NAUSEA AND VOMITING Patient taking differently: Take 4 mg by mouth every 8 (eight) hours as needed for nausea or vomiting.  01/15/18  Yes Gatha Mayer, MD  ustekinumab (STELARA) 90 MG/ML SOSY injection Inject 1 mL (90 mg total) into the skin every 8 (eight) weeks. 07/25/17  Yes Gatha Mayer, MD  verapamil (CALAN-SR) 180 MG CR tablet Take 2 tablets (360 mg total) by mouth daily. 11/08/17  Yes [name], MD  azelastine (ASTELIN) 0.1 % nasal spray PLACE 2 SPRAYS INTO BOTH NOSTRILS 2 (TWO) TIMES DAILY. USE IN EACH NOSTRIL AS DIRECTED Patient taking differently: Place 2 sprays into both nostrils 2 (two) times daily as needed for allergies. Use in each nostril as directed 01/04/18   [name], MD  cetirizine (ZYRTEC) 10 MG tablet Take 10 mg by mouth daily as needed for allergies.  11/30/17   [provider]  clotrimazole (MYCELEX) 10 MG troche Take 1 tablet (10 mg total) by mouth 5 (five) times daily. 04/19/18   [name], MD  prochlorperazine (COMPAZINE) 25 MG suppository Place 1 suppository (25 mg total) rectally every 12 (twelve) hours as needed for nausea or vomiting. 03/19/17   Gatha Mayer, MD  rizatriptan (MAXALT-MLT) 10 MG disintegrating tablet TAKE 1 TABLET (10 MG TOTAL) BY MOUTH 3 (THREE) TIMES DAILY AS NEEDED FOR MIGRAINE. 05/09/17   Kathrynn Ducking, MD    Physical Exam: Vitals:   04/19/18 1930 04/19/18 2000 04/19/18 2030 04/19/18 2100    BP: (!) 160/105 140/86 (!) 141/94 (!) 148/87  Pulse: 77 81 71 66  Resp: 19 20 18 19   Temp:      TempSrc:      SpO2: 99% 100% 99% 100%    Constitutional: No acute distress Head: Atraumatic Eyes: Conjunctiva clear ENM: Moist mucous membranes.middle of tongue somewhat atrophic. Slight tonsilar swelling. Neck: Supple Respiratory: Clear to auscultation bilaterally, no wheezing/rales/rhonchi. Normal respiratory effort. No accessory muscle use. . Cardiovascular: Regular rate and rhythm. No murmurs/rubs/gallops. Abdomen: Non-tender, non-distended. No masses. No rebound or guarding. Positive bowel sounds. Musculoskeletal: No joint deformity upper and lower extremities. Normal ROM, no contractures. Normal muscle tone.  Skin: No rashes, lesions, or ulcers.  Extremities: No peripheral edema. Palpable peripheral pulses. Neurologic: Alert, moving all 4 extremities. Psychiatric: Normal insight and judgement.   Labs on Admission: I have personally reviewed following labs and imaging studies  CBC: Recent Labs  Lab 04/19/18 1954  WBC 4.6  NEUTROABS 1.8  HGB 14.0  HCT 42.8  MCV 84.3  PLT 749   Basic Metabolic Panel: Recent Labs  Lab 04/19/18 1954  NA 139  K 3.4*  CL 107  CO2 25  GLUCOSE 103*  BUN 11  CREATININE 1.02  CALCIUM 8.6*   GFR: Estimated Creatinine Clearance: 138.4 mL/min (by C-G formula based on SCr of 1.02 mg/dL). Liver Function Tests: No results for input(s): AST, ALT, ALKPHOS, BILITOT, PROT, ALBUMIN in the last 168 hours. No results for input(s): LIPASE, AMYLASE in the last 168 hours. No results for input(s): AMMONIA in the last 168 hours. Coagulation Profile: No results for input(s): INR, PROTIME in the last 168 hours. Cardiac Enzymes: No results for input(s): CKTOTAL, CKMB, CKMBINDEX, TROPONINI in the last 168 hours. BNP (last 3 results) No results for input(s): PROBNP in the last 8760 hours. HbA1C: No results for input(s): HGBA1C in the last 72  hours. CBG: No results for input(s): GLUCAP in the last 168 hours. Lipid Profile: No results for input(s): CHOL, HDL, LDLCALC, TRIG, CHOLHDL, LDLDIRECT in the last 72 hours. Thyroid Function Tests: No results for input(s): TSH, T4TOTAL, FREET4, T3FREE, THYROIDAB in the last 72 hours. Anemia Panel: No results for input(s): VITAMINB12, FOLATE, FERRITIN, TIBC, IRON, RETICCTPCT in the last 72 hours. Urine analysis:    Component Value Date/Time   COLORURINE YELLOW 03/25/2018 1426   APPEARANCEUR CLEAR 03/25/2018 1426   LABSPEC 1.020 03/25/2018 1426   PHURINE 6.5 03/25/2018 1426   GLUCOSEU NEGATIVE 03/25/2018 1426   HGBUR NEGATIVE 03/25/2018 1426   BILIRUBINUR NEGATIVE 03/25/2018 1426   BILIRUBINUR small (A) 03/12/2018 1145   BILIRUBINUR small 12/01/2014 2106   KETONESUR NEGATIVE 03/25/2018 1426   PROTEINUR =30 (A) 03/12/2018 1145   PROTEINUR NEGATIVE 02/11/2018 0050   UROBILINOGEN 1.0 03/25/2018 1426   NITRITE NEGATIVE 03/25/2018 1426   LEUKOCYTESUR NEGATIVE 03/25/2018 1426    Radiological Exams on Admission: Ct Soft Tissue Neck W Contrast  Result Date: 04/19/2018 CLINICAL DATA:  Throat swelling EXAM: CT NECK WITH CONTRAST TECHNIQUE: Multidetector CT imaging of the neck was performed using the standard protocol following the bolus administration of intravenous contrast. CONTRAST:  23m OMNIPAQUE IOHEXOL 300 MG/ML  SOLN COMPARISON:  None. FINDINGS: PHARYNX AND LARYNX: --Nasopharynx: Mild prominence of the adenoid tonsils. --Oral cavity and oropharynx: Prominent palatine and lingual tonsils. The uvula is adherent to the left palatine tonsil. The tongue appears enlarged. --Hypopharynx: Crowding of the vallecula by the lingual tonsils. --Larynx: Normal epiglottis and pre-epiglottic space. Normal aryepiglottic and vocal folds. --Retropharyngeal space: No abscess, effusion or lymphadenopathy. SALIVARY GLANDS: --Parotid: Multiple right parotid calcifications. Normal left parotid gland.  --Submandibular: Symmetric without inflammation. No sialolithiasis or ductal dilatation. --Sublingual: Normal. No ranula or other visible lesion of the base of tongue and floor of mouth. THYROID: Normal. LYMPH NODES: No enlarged or abnormal density lymph nodes. VASCULAR: Major cervical vessels are patent. LIMITED INTRACRANIAL: Normal. VISUALIZED ORBITS: Normal. MASTOIDS AND VISUALIZED PARANASAL SINUSES: Moderate opacification of the left sphenoid sinus. SKELETON: No bony spinal canal stenosis. No lytic or blastic lesions. UPPER CHEST: Clear. OTHER: None. IMPRESSION: 1. Lingual edema and tonsillar enlargement. These findings may be secondary to drug related angioedema or acute tonsillopharyngitis. 2. No severe airway compromise.  Normal epiglottis. Electronically Signed   By: KUlyses JarredM.D.   On: 04/19/2018 21:50     Assessment/Plan Principal Problem:   Angioedema Active Problems:   Common migraine without aura  Hypertension   Morbid obesity ([place])   Crohn's disease of small intestine with other complication ([place])   # Angioedema - symptoms of mild difficulty breathing swalling, ct showing swelling of tongue and tonsils. Dry mouth and glossitis are odd presentation. Most likely cause if angioedema is acei use. Rapid GAS swab neg. No signs abscess on ct. Stable respiratory status, think safe for general ward. - admit for observation - given glossitis symptoms and dry mouth checking iron panel, folate, sjogren's panel, ra. b12 obtained at pcp's office - further w/u should symptoms not improve w/ d/c of lisinopril and if above labs neg  # HTN - here mild elevation - hold lisinopril as above, resume home meds, may need additional agent prior to d/c  # migraine headache - says triptan and flexeril typically work for him - triptan and flexeril  # OSA - says not on cpap at home, doesn't tolerate  # Crohn's - stable - home meds  DVT prophylaxis: ted hose, early ambulation and anticipate short  hospital stay Code Status: full  Family Communication: wife robin [phone]  Disposition Plan: tbd  Consults called: none  Admission status: obs med/surg    Desma Maxim MD [facility] Pager [phone]  If 7PM-7AM, please contact night-coverage [email or web address] Password [email or web address]  04/19/2018, 11:36 PM

## 2018-04-19 NOTE — Progress Notes (Signed)
Subjective:    Patient ID: Craig Guzman, male    DOB: September 27, 1970, 47 y.o.   MRN: [id number]  HPI Craig Guzman is a 47 y.o. male Presents today for: Chief Complaint  Patient presents with  . Sore Throat    x3 weeks; states its hurts to swallow;denies any coughing/fever   [place] mouth, dry mouth for 3 weeks, throat sore past 2 days. No fever. Slight HA every now and then. No current HA or chest pain. Did increase doxazosin 8/30, but few weeks later for dry mouth. Stelara restarted 1 month ago.  nocturia once per night now. Increased thirst past 3 weeks. No rash in mouth.  Tongue feels "burnt". Was on antibiotics after surgery.  K 3.4, glucose 107 on 9/23 labs.  Bypass gastrojejunostomy 02/06/18. No recent nausea/vomiting/heartburn.  Hx of HTN, has not missed meds. High last week, improved after recheck.  Home Bp 140-145/85-95.  Some restaurant and frozen foods. No alcohol.    At the conclusion of his visit while going through instructions, he stated he needed a work note as he was unable to breathe this morning.  He states that his tongue was swollen and was told that he was wheezing.  Further discussion he feels like he is swollen in the back of his throat currently. Able to breathe, but feels like throat is closing.  Tongue felt swollen this morning. Wheezing and choked one time. Felt better after he woke up.  Nose feels blocked - feels like it his allergies.   Patient Active Problem List   Diagnosis Date Noted  . Crohn's disease of small intestine with other complication ([place]) 57/84/6962  . Duodenal stricture   . Intractable vomiting with nausea   . Cigarette nicotine dependence without complication 95/28/4132  . Shifting sleep-work schedule, affecting sleep 05/15/2016  . Sleep deprivation 05/15/2016  . Sleep related hypoventilation in conditions classified elsewhere 05/15/2016  . Sleep related headaches 05/15/2016  . Morbid obesity ([place]) 09/19/2013  . Diarrhea 07/10/2013  .  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 Hypertension 11/26/2012  . Gastroparesis??? 12/08/2011  . Common migraine without aura 12/08/2011  . Long-term use of immunosuppressant medication 08/08/2011  . Gastroduodenal Crohn's disease with intestinal obstruction ([place]) 05/17/2011  . GERD (gastroesophageal reflux disease) 04/21/2011   Past Medical History:  Diagnosis Date  . Allergy    trees/ grasses/ animals/dust/mold  . Anxiety   . Biliary dyskinesia   . Colloid thyroid nodule   . Crohn's disease ([place])    Stomach, terminal ileum, cecum  . Gastric ulcer    antral  . GERD (gastroesophageal reflux disease)   . History of migraine headaches   . HTN (hypertension)   . Kidney stone 09/20/2012  . Migraine   . Obesity   . Pneumonia 02/27/2014   ED  . Sleep apnea    has cpap- does not wear    Past Surgical History:  Procedure Laterality Date  . BALLOON DILATION N/A 12/06/2017   Procedure: BALLOON DILATION;  Surgeon: Gatha Mayer, MD;  Location: [facility];  Service: Endoscopy;  Laterality: N/A;  . CHOLECYSTECTOMY  2011   [place]  . COLONOSCOPY  07/26/11   Crohn's colitis, ileitis  . ESOPHAGOGASTRODUODENOSCOPY  05/04/11, 07/26/11   granulomatous gastritis - Crohn's  . ESOPHAGOGASTRODUODENOSCOPY (EGD) WITH PROPOFOL N/A 03/28/2017   Procedure: ESOPHAGOGASTRODUODENOSCOPY (EGD) WITH PROPOFOL  with balloon dilation of duodenum;  Surgeon: [name], MD;  Location: [facility];  Service: Gastroenterology;  Laterality: N/A;  . ESOPHAGOGASTRODUODENOSCOPY (EGD) WITH  PROPOFOL N/A 12/06/2017   Procedure: ESOPHAGOGASTRODUODENOSCOPY (EGD) WITH PROPOFOL;  Surgeon: Gatha Mayer, MD;  Location: [facility];  Service: Endoscopy;  Laterality: N/A;  . FLEXIBLE SIGMOIDOSCOPY    . MULTIPLE TOOTH EXTRACTIONS  2005  . SHOULDER SURGERY     right  . UPPER GASTROINTESTINAL ENDOSCOPY    . VASECTOMY     bilateral w/lysis of penile adhesions   Allergies  Allergen Reactions  . Zithromax  [Azithromycin] Hypertension   Prior to Admission medications   Medication Sig Start Date End Date Taking? Authorizing Provider  azelastine (ASTELIN) 0.1 % nasal spray PLACE 2 SPRAYS INTO BOTH NOSTRILS 2 (TWO) TIMES DAILY. USE IN EACH NOSTRIL AS DIRECTED 01/04/18  Yes [name], MD  cetirizine (ZYRTEC) 10 MG tablet TAKE ONE TABLET (10 MG DOSE) BY MOUTH DAILY. 11/30/17  Yes [provider]  cyclobenzaprine (FLEXERIL) 10 MG tablet Take 10 mg by mouth 3 (three) times daily as needed for muscle spasms.   Yes [provider]  doxazosin (CARDURA) 8 MG tablet Take 1 tablet (8 mg total) by mouth daily. 03/01/18  Yes [name], MD  lisinopril (PRINIVIL,ZESTRIL) 40 MG tablet Take 1 tablet (40 mg total) by mouth daily. Office visit needed for 90 day supply 11/08/17  Yes [name], MD  mercaptopurine (PURINETHOL) 50 MG tablet Take 1 tablet (50 mg total) by mouth daily. Give on an empty stomach 1 hour before or 2 hours after meals. 05/02/17  Yes Gatha Mayer, MD  omeprazole-sodium bicarbonate (ZEGERID) 40-1100 MG capsule TAKE 1 CAPSULE BY MOUTH AT BEDTIME. 04/25/17  Yes [name], MD  ondansetron (ZOFRAN-ODT) 4 MG disintegrating tablet TAKE 1 TABLET BY MOUTH EVERY 8 HOURS AS NEEDED FOR NAUSEA AND VOMITING 01/15/18  Yes Gatha Mayer, MD  prochlorperazine (COMPAZINE) 25 MG suppository Place 1 suppository (25 mg total) rectally every 12 (twelve) hours as needed for nausea or vomiting. 03/19/17  Yes Gatha Mayer, MD  rizatriptan (MAXALT-MLT) 10 MG disintegrating tablet TAKE 1 TABLET (10 MG TOTAL) BY MOUTH 3 (THREE) TIMES DAILY AS NEEDED FOR MIGRAINE. 05/09/17  Yes Kathrynn Ducking, MD  ustekinumab (STELARA) 90 MG/ML SOSY injection Inject 1 mL (90 mg total) into the skin every 8 (eight) weeks. 07/25/17  Yes Gatha Mayer, MD  verapamil (CALAN-SR) 180 MG CR tablet Take 2 tablets (360 mg total) by mouth daily. 11/08/17  Yes [name], MD   Social History    Socioeconomic History  . Marital status: Married    Spouse name: Not on file  . Number of children: 3  . Years of education: Some college  . Highest education level: Not on file  Occupational History  . Occupation: [employer]  Social Needs  . Financial resource strain: Not on file  . Food insecurity:    Worry: Not on file    Inability: Not on file  . Transportation needs:    Medical: Not on file    Non-medical: Not on file  Tobacco Use  . Smoking status: Former Smoker    Packs/day: 0.75    Years: 21.00    Pack years: 15.75    Types: Cigarettes    Last attempt to quit: 12/01/2017    Years since quitting: 0.3  . Smokeless tobacco: Former Systems developer    Types: Snuff  . Tobacco comment: Counseling sheet given in exam room   Substance and Sexual Activity  . Alcohol use: Yes    Alcohol/week: 0.0 standard drinks  Comment: occ  . Drug use: No  . Sexual activity: Yes    Partners: Female  Lifestyle  . Physical activity:    Days per week: Not on file    Minutes per session: Not on file  . Stress: Not on file  Relationships  . Social connections:    Talks on phone: Not on file    Gets together: Not on file    Attends religious service: Not on file    Active member of club or organization: Not on file    Attends meetings of clubs or organizations: Not on file    Relationship status: Not on file  . Intimate partner violence:    Fear of current or ex partner: Not on file    Emotionally abused: Not on file    Physically abused: Not on file    Forced sexual activity: Not on file  Other Topics Concern  . Not on file  Social History Narrative   Married. Education: [employer].    Lives at home w/ his wife and kids   Right-handed   Caffeine: 3 sodas per day    Review of Systems As above in HPI.     Objective:   Physical Exam  Constitutional: He is oriented to person, place, and time. He appears well-developed and well-nourished.  HENT:  Head: Normocephalic and atraumatic.   Mouth/Throat: Oral lesions (white coating of tongue, with some central sparing. no buccal lesions. ) present. Uvula swelling (Seen on repeat exam with further depression of tongue, does have pendulous uvula.  Prominent right tonsil but posterior oropharynx otherwise does not appear edematous.  Talking without difficulty,) present.  Eyes: Pupils are equal, round, and reactive to light. EOM are normal.  Neck: No JVD present. Carotid bruit is not present.  Cardiovascular: Normal rate, regular rhythm and normal heart sounds.  No murmur heard. Pulmonary/Chest: Effort normal and breath sounds normal. He has no rales.  Musculoskeletal: He exhibits no edema.  Neurological: He is alert and oriented to person, place, and time.  Skin: Skin is warm and dry.  Psychiatric: He has a normal mood and affect.  Vitals reviewed.  Vitals:   04/19/18 1508 04/19/18 1539 04/19/18 1625  BP: (!) 160/101 (!) 153/107 (!) 142/99  Pulse: 83    Resp: 18    Temp: 97.6 F (36.4 C)    TempSrc: Oral    SpO2: 98%    Weight: (!) 339 lb (153.8 kg)    Height: 6' (1.829 m)     Results for orders placed or performed in visit on 04/19/18  POCT glucose (manual entry)  Result Value Ref Range   POC Glucose 80 70 - 99 mg/dl  POCT Skin KOH  Result Value Ref Range   Skin KOH, POC Negative Negative   Over 45mns of treatment with change in plan and additional history.     Assessment & Plan:    PAshir Kunzis a 47y.o. male Dry mouth - Plan: POCT Skin KOH  Glossitis - Plan: Vitamin B12, POCT Skin KOH, Folate, clotrimazole (MYCELEX) 10 MG troche  Essential hypertension  Increased thirst - Plan: POCT glucose (manual entry)  Hyperglycemia - Plan: POCT glucose (manual entry)  Hypokalemia - Plan: Basic metabolic panel  Uvular swelling  Dysphagia, unspecified type  Initial history and evaluation for dry mouth, coating of the time with slight sore throat.  Plan for possible Mycelex thrush, dry mouth likely due to  higher dose of doxazosin as present for few weeks.  Blood pressure elevated, but improved somewhat during office visit and plan for initial monitoring at home to decide on further changes.  However on further history he has had a swelling sensation in the back of his throat since this morning with tongue swelling, initial shortness of breath this morning.  He is clearing secretions and speaking normally at this time.  Tongue does not appear significantly swollen at this time, but does have pendulous uvula with some possible swelling.  He is on ACE inhibitor, and concern for ACE inhibitor angioedema was discussed.  With persistent symptoms we will have him evaluated further through emergency room as may need monitoring, cessation of ACE inhibitor, and with current blood pressure other medication may be needed.  Plan discussed with patient and spouse.  Meds ordered this encounter  Medications  . clotrimazole (MYCELEX) 10 MG troche    Sig: Take 1 tablet (10 mg total) by mouth 5 (five) times daily.    Dispense:  70 tablet    Refill:  0   Patient Instructions   Dry mouth may be related to blood pressure medication. Test for thrush was negative, but with recent antibiotics the coating on the tongue could still be fungal.  Antifungal lozenges were given and other tests were checked for tongue discomfort, but if that is not improving in the next week, would recommend recheck.  However I am most concerned about the symptoms that we discussed at the end of your visit, and do see some swelling at the back of your throat/uvula.  Because you are on ACE inhibitor for blood pressure, symptoms this morning with tongue swelling and throat sensation of swelling could be angioedema.  Proceed directly to the emergency room for further evaluation and possible monitoring.  Additionally if you do have to stop ACE inhibitors, blood pressure is already running high here today, so other meds will need to be  started.   Hypertension Hypertension, commonly called high blood pressure, is when the force of blood pumping through the arteries is too strong. The arteries are the blood vessels that carry blood from the heart throughout the body. Hypertension forces the heart to work harder to pump blood and may cause arteries to become narrow or stiff. Having untreated or uncontrolled hypertension can cause heart attacks, strokes, kidney disease, and other problems. A blood pressure reading consists of a higher number over a lower number. Ideally, your blood pressure should be below 120/80. The first ("top") number is called the systolic pressure. It is a measure of the pressure in your arteries as your heart beats. The second ("bottom") number is called the diastolic pressure. It is a measure of the pressure in your arteries as the heart relaxes. What are the causes? The cause of this condition is not known. What increases the risk? Some risk factors for high blood pressure are under your control. Others are not. Factors you can change  Smoking.  Having type 2 diabetes mellitus, high cholesterol, or both.  Not getting enough exercise or physical activity.  Being overweight.  Having too much fat, sugar, calories, or salt (sodium) in your diet.  Drinking too much alcohol. Factors that are difficult or impossible to change  Having chronic kidney disease.  Having a family history of high blood pressure.  Age. Risk increases with age.  Race. You may be at higher risk if you are African-American.  Gender. Men are at higher risk than women before age 7. After age 94, women are at higher risk  than men.  Having obstructive sleep apnea.  Stress. What are the signs or symptoms? Extremely high blood pressure (hypertensive crisis) may cause:  Headache.  Anxiety.  Shortness of breath.  Nosebleed.  Nausea and vomiting.  Severe chest pain.  Jerky movements you cannot control  (seizures).  How is this diagnosed? This condition is diagnosed by measuring your blood pressure while you are seated, with your arm resting on a surface. The cuff of the blood pressure monitor will be placed directly against the skin of your upper arm at the level of your heart. It should be measured at least twice using the same arm. Certain conditions can cause a difference in blood pressure between your right and left arms. Certain factors can cause blood pressure readings to be lower or higher than normal (elevated) for a short period of time:  When your blood pressure is higher when you are in a health care provider's office than when you are at home, this is called white coat hypertension. Most people with this condition do not need medicines.  When your blood pressure is higher at home than when you are in a health care provider's office, this is called masked hypertension. Most people with this condition may need medicines to control blood pressure.  If you have a high blood pressure reading during one visit or you have normal blood pressure with other risk factors:  You may be asked to return on a different day to have your blood pressure checked again.  You may be asked to monitor your blood pressure at home for 1 week or longer.  If you are diagnosed with hypertension, you may have other blood or imaging tests to help your health care provider understand your overall risk for other conditions. How is this treated? This condition is treated by making healthy lifestyle changes, such as eating healthy foods, exercising more, and reducing your alcohol intake. Your health care provider may prescribe medicine if lifestyle changes are not enough to get your blood pressure under control, and if:  Your systolic blood pressure is above 130.  Your diastolic blood pressure is above 80.  Your personal target blood pressure may vary depending on your medical conditions, your age, and other  factors. Follow these instructions at home: Eating and drinking  Eat a diet that is high in fiber and potassium, and low in sodium, added sugar, and fat. An example eating plan is called the DASH (Dietary Approaches to Stop Hypertension) diet. To eat this way: ? Eat plenty of fresh fruits and vegetables. Try to fill half of your plate at each meal with fruits and vegetables. ? Eat whole grains, such as whole wheat pasta, brown rice, or whole grain bread. Fill about one quarter of your plate with whole grains. ? Eat or drink low-fat dairy products, such as skim milk or low-fat yogurt. ? Avoid fatty cuts of meat, processed or cured meats, and poultry with skin. Fill about one quarter of your plate with lean proteins, such as fish, chicken without skin, beans, eggs, and tofu. ? Avoid premade and processed foods. These tend to be higher in sodium, added sugar, and fat.  Reduce your daily sodium intake. Most people with hypertension should eat less than 1,500 mg of sodium a day.  Limit alcohol intake to no more than 1 drink a day for nonpregnant women and 2 drinks a day for men. One drink equals 12 oz of beer, 5 oz of wine, or 1 oz of hard  liquor. Lifestyle  Work with your health care provider to maintain a healthy body weight or to lose weight. Ask what an ideal weight is for you.  Get at least 30 minutes of exercise that causes your heart to beat faster (aerobic exercise) most days of the week. Activities may include walking, swimming, or biking.  Include exercise to strengthen your muscles (resistance exercise), such as pilates or lifting weights, as part of your weekly exercise routine. Try to do these types of exercises for 30 minutes at least 3 days a week.  Do not use any products that contain nicotine or tobacco, such as cigarettes and e-cigarettes. If you need help quitting, ask your health care provider.  Monitor your blood pressure at home as told by your health care provider.  Keep  all follow-up visits as told by your health care provider. This is important. Medicines  Take over-the-counter and prescription medicines only as told by your health care provider. Follow directions carefully. Blood pressure medicines must be taken as prescribed.  Do not skip doses of blood pressure medicine. Doing this puts you at risk for problems and can make the medicine less effective.  Ask your health care provider about side effects or reactions to medicines that you should watch for. Contact a health care provider if:  You think you are having a reaction to a medicine you are taking.  You have headaches that keep coming back (recurring).  You feel dizzy.  You have swelling in your ankles.  You have trouble with your vision. Get help right away if:  You develop a severe headache or confusion.  You have unusual weakness or numbness.  You feel faint.  You have severe pain in your chest or abdomen.  You vomit repeatedly.  You have trouble breathing. Summary  Hypertension is when the force of blood pumping through your arteries is too strong. If this condition is not controlled, it may put you at risk for serious complications.  Your personal target blood pressure may vary depending on your medical conditions, your age, and other factors. For most people, a normal blood pressure is less than 120/80.  Hypertension is treated with lifestyle changes, medicines, or a combination of both. Lifestyle changes include weight loss, eating a healthy, low-sodium diet, exercising more, and limiting alcohol. This information is not intended to replace advice given to you by your health care provider. Make sure you discuss any questions you have with your health care provider. Document Released: 06/19/2005 Document Revised: 05/17/2016 Document Reviewed: 05/17/2016 Elsevier Interactive Patient Education  [employer].   If you have lab work done today you will be contacted with  your lab results within the next 2 weeks.  If you have not heard from [place] then please contact us. The fastest way to get your results is to register for My Chart.   IF you received an x-ray today, you will receive an invoice from [facility]. Please contact [facility] at [phone] with questions or concerns regarding your invoice.   IF you received labwork today, you will receive an invoice from [facility]. Please contact [facility] at [phone] with questions or concerns regarding your invoice.   Our billing staff will not be able to assist you with questions regarding bills from these companies.  You will be contacted with the lab results as soon as they are available. The fastest way to get your results is to activate your My Chart account. Instructions are located on the last page  of this paperwork. If you have not heard from [place] regarding the results in 2 weeks, please contact this office.       Signed,   Merri Ray, MD [facility].  04/19/18 5:27 PM

## 2018-04-19 NOTE — Patient Instructions (Addendum)
Dry mouth may be related to blood pressure medication. Test for thrush was negative, but with recent antibiotics the coating on the tongue could still be fungal.  Antifungal lozenges were given and other tests were checked for tongue discomfort, but if that is not improving in the next week, would recommend recheck.  However I am most concerned about the symptoms that we discussed at the end of your visit, and do see some swelling at the back of your throat/uvula.  Because you are on ACE inhibitor for blood pressure, symptoms this morning with tongue swelling and throat sensation of swelling could be angioedema.  Proceed directly to the emergency room for further evaluation and possible monitoring.  Additionally if you do have to stop ACE inhibitors, blood pressure is already running high here today, so other meds will need to be started.   Hypertension Hypertension, commonly called high blood pressure, is when the force of blood pumping through the arteries is too strong. The arteries are the blood vessels that carry blood from the heart throughout the body. Hypertension forces the heart to work harder to pump blood and may cause arteries to become narrow or stiff. Having untreated or uncontrolled hypertension can cause heart attacks, strokes, kidney disease, and other problems. A blood pressure reading consists of a higher number over a lower number. Ideally, your blood pressure should be below 120/80. The first ("top") number is called the systolic pressure. It is a measure of the pressure in your arteries as your heart beats. The second ("bottom") number is called the diastolic pressure. It is a measure of the pressure in your arteries as the heart relaxes. What are the causes? The cause of this condition is not known. What increases the risk? Some risk factors for high blood pressure are under your control. Others are not. Factors you can change  Smoking.  Having type 2 diabetes mellitus, high  cholesterol, or both.  Not getting enough exercise or physical activity.  Being overweight.  Having too much fat, sugar, calories, or salt (sodium) in your diet.  Drinking too much alcohol. Factors that are difficult or impossible to change  Having chronic kidney disease.  Having a family history of high blood pressure.  Age. Risk increases with age.  Race. You may be at higher risk if you are African-American.  Gender. Men are at higher risk than women before age 33. After age 6, women are at higher risk than men.  Having obstructive sleep apnea.  Stress. What are the signs or symptoms? Extremely high blood pressure (hypertensive crisis) may cause:  Headache.  Anxiety.  Shortness of breath.  Nosebleed.  Nausea and vomiting.  Severe chest pain.  Jerky movements you cannot control (seizures).  How is this diagnosed? This condition is diagnosed by measuring your blood pressure while you are seated, with your arm resting on a surface. The cuff of the blood pressure monitor will be placed directly against the skin of your upper arm at the level of your heart. It should be measured at least twice using the same arm. Certain conditions can cause a difference in blood pressure between your right and left arms. Certain factors can cause blood pressure readings to be lower or higher than normal (elevated) for a short period of time:  When your blood pressure is higher when you are in a health care provider's office than when you are at home, this is called white coat hypertension. Most people with this condition do not need medicines.  When your blood pressure is higher at home than when you are in a health care provider's office, this is called masked hypertension. Most people with this condition may need medicines to control blood pressure.  If you have a high blood pressure reading during one visit or you have normal blood pressure with other risk factors:  You may be  asked to return on a different day to have your blood pressure checked again.  You may be asked to monitor your blood pressure at home for 1 week or longer.  If you are diagnosed with hypertension, you may have other blood or imaging tests to help your health care provider understand your overall risk for other conditions. How is this treated? This condition is treated by making healthy lifestyle changes, such as eating healthy foods, exercising more, and reducing your alcohol intake. Your health care provider may prescribe medicine if lifestyle changes are not enough to get your blood pressure under control, and if:  Your systolic blood pressure is above 130.  Your diastolic blood pressure is above 80.  Your personal target blood pressure may vary depending on your medical conditions, your age, and other factors. Follow these instructions at home: Eating and drinking  Eat a diet that is high in fiber and potassium, and low in sodium, added sugar, and fat. An example eating plan is called the DASH (Dietary Approaches to Stop Hypertension) diet. To eat this way: ? Eat plenty of fresh fruits and vegetables. Try to fill half of your plate at each meal with fruits and vegetables. ? Eat whole grains, such as whole wheat pasta, brown rice, or whole grain bread. Fill about one quarter of your plate with whole grains. ? Eat or drink low-fat dairy products, such as skim milk or low-fat yogurt. ? Avoid fatty cuts of meat, processed or cured meats, and poultry with skin. Fill about one quarter of your plate with lean proteins, such as fish, chicken without skin, beans, eggs, and tofu. ? Avoid premade and processed foods. These tend to be higher in sodium, added sugar, and fat.  Reduce your daily sodium intake. Most people with hypertension should eat less than 1,500 mg of sodium a day.  Limit alcohol intake to no more than 1 drink a day for nonpregnant women and 2 drinks a day for men. One drink equals  12 oz of beer, 5 oz of wine, or 1 oz of hard liquor. Lifestyle  Work with your health care provider to maintain a healthy body weight or to lose weight. Ask what an ideal weight is for you.  Get at least 30 minutes of exercise that causes your heart to beat faster (aerobic exercise) most days of the week. Activities may include walking, swimming, or biking.  Include exercise to strengthen your muscles (resistance exercise), such as pilates or lifting weights, as part of your weekly exercise routine. Try to do these types of exercises for 30 minutes at least 3 days a week.  Do not use any products that contain nicotine or tobacco, such as cigarettes and e-cigarettes. If you need help quitting, ask your health care provider.  Monitor your blood pressure at home as told by your health care provider.  Keep all follow-up visits as told by your health care provider. This is important. Medicines  Take over-the-counter and prescription medicines only as told by your health care provider. Follow directions carefully. Blood pressure medicines must be taken as prescribed.  Do not skip doses of blood  pressure medicine. Doing this puts you at risk for problems and can make the medicine less effective.  Ask your health care provider about side effects or reactions to medicines that you should watch for. Contact a health care provider if:  You think you are having a reaction to a medicine you are taking.  You have headaches that keep coming back (recurring).  You feel dizzy.  You have swelling in your ankles.  You have trouble with your vision. Get help right away if:  You develop a severe headache or confusion.  You have unusual weakness or numbness.  You feel faint.  You have severe pain in your chest or abdomen.  You vomit repeatedly.  You have trouble breathing. Summary  Hypertension is when the force of blood pumping through your arteries is too strong. If this condition is not  controlled, it may put you at risk for serious complications.  Your personal target blood pressure may vary depending on your medical conditions, your age, and other factors. For most people, a normal blood pressure is less than 120/80.  Hypertension is treated with lifestyle changes, medicines, or a combination of both. Lifestyle changes include weight loss, eating a healthy, low-sodium diet, exercising more, and limiting alcohol. This information is not intended to replace advice given to you by your health care provider. Make sure you discuss any questions you have with your health care provider. Document Released: 06/19/2005 Document Revised: 05/17/2016 Document Reviewed: 05/17/2016 Elsevier Interactive Patient Education  [employer].   If you have lab work done today you will be contacted with your lab results within the next 2 weeks.  If you have not heard from [place] then please contact us. The fastest way to get your results is to register for My Chart.   IF you received an x-ray today, you will receive an invoice from [facility]. Please contact [facility] at [phone] with questions or concerns regarding your invoice.   IF you received labwork today, you will receive an invoice from [facility]. Please contact [facility] at [phone] with questions or concerns regarding your invoice.   Our billing staff will not be able to assist you with questions regarding bills from these companies.  You will be contacted with the lab results as soon as they are available. The fastest way to get your results is to activate your My Chart account. Instructions are located on the last page of this paperwork. If you have not heard from [place] regarding the results in 2 weeks, please contact this office.

## 2018-04-19 NOTE — ED Notes (Signed)
Pt ambulatory to restroom with steady gait.

## 2018-04-19 NOTE — ED Triage Notes (Signed)
Patient here from home with complaints of hypertension and sore throat. Patient reports that he has tongue swelling and sore throat. Seen at PCP office today. Patient states that dr was considering taking him off lisinopril due to possible allergy.

## 2018-04-19 NOTE — ED Provider Notes (Signed)
[facility] DEPT Provider Note   CSN: [id number] Arrival date & time: 04/19/18  1834     History   Chief Complaint Chief Complaint  Patient presents with  . Hypertension  . Angioedema    HPI Craig Guzman is a 47 y.o. male.  Patient is a 47 year old male with a history of hypertension who presents with throat swelling.  He states that earlier this morning he felt like his throat was swelling.  He has had a hard time breathing because of that.  Is been consistent throughout the day.  He states it is not getting any worse but has not really gotten better.  He noted some wheezing from his throat earlier this morning but says he has not heard it the rest of the day.  He denies any swelling to his lips although his lips feel dry and his tongue feels dry.  He denies any swelling of his tongue.  He does feel little short of breath due to throat swelling.  He denies any history of similar reactions.  He is on lisinopril which he has been on for about 15 years.  He denies any other new exposures.  No rashes or itching.  No fevers.  No URI symptoms.     Past Medical History:  Diagnosis Date  . Allergy    trees/ grasses/ animals/dust/mold  . Anxiety   . Biliary dyskinesia   . Colloid thyroid nodule   . Crohn's disease ([place])    Stomach, terminal ileum, cecum  . Gastric ulcer    antral  . GERD (gastroesophageal reflux disease)   . History of migraine headaches   . HTN (hypertension)   . Kidney stone 09/20/2012  . Migraine   . Obesity   . Pneumonia 02/27/2014   ED  . Sleep apnea    has cpap- does not wear     Patient Active Problem List   Diagnosis Date Noted  . Crohn's disease of small intestine with other complication ([place]) 59/93/5701  . Duodenal stricture   . Intractable vomiting with nausea   . Cigarette nicotine dependence without complication 77/93/9030  . Shifting sleep-work schedule, affecting sleep 05/15/2016  . Sleep deprivation  05/15/2016  . Sleep related hypoventilation in conditions classified elsewhere 05/15/2016  . Sleep related headaches 05/15/2016  . Morbid obesity ([place]) 09/19/2013  . Diarrhea 07/10/2013  . 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 Hypertension 11/26/2012  . Gastroparesis??? 12/08/2011  . Common migraine without aura 12/08/2011  . Long-term use of immunosuppressant medication 08/08/2011  . Gastroduodenal Crohn's disease with intestinal obstruction ([place]) 05/17/2011  . GERD (gastroesophageal reflux disease) 04/21/2011    Past Surgical History:  Procedure Laterality Date  . BALLOON DILATION N/A 12/06/2017   Procedure: BALLOON DILATION;  Surgeon: Gatha Mayer, MD;  Location: [facility];  Service: Endoscopy;  Laterality: N/A;  . CHOLECYSTECTOMY  2011   [place]  . COLONOSCOPY  07/26/11   Crohn's colitis, ileitis  . ESOPHAGOGASTRODUODENOSCOPY  05/04/11, 07/26/11   granulomatous gastritis - Crohn's  . ESOPHAGOGASTRODUODENOSCOPY (EGD) WITH PROPOFOL N/A 03/28/2017   Procedure: ESOPHAGOGASTRODUODENOSCOPY (EGD) WITH PROPOFOL  with balloon dilation of duodenum;  Surgeon: [name], MD;  Location: [facility];  Service: Gastroenterology;  Laterality: N/A;  . ESOPHAGOGASTRODUODENOSCOPY (EGD) WITH PROPOFOL N/A 12/06/2017   Procedure: ESOPHAGOGASTRODUODENOSCOPY (EGD) WITH PROPOFOL;  Surgeon: Gatha Mayer, MD;  Location: [facility];  Service: Endoscopy;  Laterality: N/A;  . FLEXIBLE SIGMOIDOSCOPY    . MULTIPLE TOOTH EXTRACTIONS  2005  .  SHOULDER SURGERY     right  . UPPER GASTROINTESTINAL ENDOSCOPY    . VASECTOMY     bilateral w/lysis of penile adhesions        Home Medications    Prior to Admission medications   Medication Sig Start Date End Date Taking? Authorizing Provider  cyclobenzaprine (FLEXERIL) 10 MG tablet Take 10 mg by mouth 3 (three) times daily as needed for muscle spasms.   Yes [provider]  doxazosin (CARDURA) 8 MG tablet Take 1 tablet (8 mg total) by  mouth daily. 03/01/18  Yes [name], MD  lisinopril (PRINIVIL,ZESTRIL) 40 MG tablet Take 1 tablet (40 mg total) by mouth daily. Office visit needed for 90 day supply 11/08/17  Yes [name], MD  mercaptopurine (PURINETHOL) 50 MG tablet Take 1 tablet (50 mg total) by mouth daily. Give on an empty stomach 1 hour before or 2 hours after meals. 05/02/17  Yes Gatha Mayer, MD  omeprazole-sodium bicarbonate (ZEGERID) 40-1100 MG capsule TAKE 1 CAPSULE BY MOUTH AT BEDTIME. Patient taking differently: Take 1 capsule by mouth at bedtime as needed (indigestion).  04/25/17  Yes [name], MD  ondansetron (ZOFRAN-ODT) 4 MG disintegrating tablet TAKE 1 TABLET BY MOUTH EVERY 8 HOURS AS NEEDED FOR NAUSEA AND VOMITING Patient taking differently: Take 4 mg by mouth every 8 (eight) hours as needed for nausea or vomiting.  01/15/18  Yes Gatha Mayer, MD  ustekinumab (STELARA) 90 MG/ML SOSY injection Inject 1 mL (90 mg total) into the skin every 8 (eight) weeks. 07/25/17  Yes Gatha Mayer, MD  verapamil (CALAN-SR) 180 MG CR tablet Take 2 tablets (360 mg total) by mouth daily. 11/08/17  Yes [name], MD  azelastine (ASTELIN) 0.1 % nasal spray PLACE 2 SPRAYS INTO BOTH NOSTRILS 2 (TWO) TIMES DAILY. USE IN EACH NOSTRIL AS DIRECTED Patient taking differently: Place 2 sprays into both nostrils 2 (two) times daily as needed for allergies. Use in each nostril as directed 01/04/18   [name], MD  cetirizine (ZYRTEC) 10 MG tablet Take 10 mg by mouth daily as needed for allergies.  11/30/17   [provider]  clotrimazole (MYCELEX) 10 MG troche Take 1 tablet (10 mg total) by mouth 5 (five) times daily. 04/19/18   [name], MD  prochlorperazine (COMPAZINE) 25 MG suppository Place 1 suppository (25 mg total) rectally every 12 (twelve) hours as needed for nausea or vomiting. 03/19/17   Gatha Mayer, MD  rizatriptan (MAXALT-MLT) 10 MG disintegrating tablet TAKE 1 TABLET (10 MG  TOTAL) BY MOUTH 3 (THREE) TIMES DAILY AS NEEDED FOR MIGRAINE. 05/09/17   Kathrynn Ducking, MD    Family History Family History  Problem Relation Age of Onset  . Colon polyps Father   . Diabetes Father   . Hypertension Father   . Hypertension Mother   . Asthma Mother   . Heart disease Maternal Grandmother   . Colon cancer Neg Hx   . Rectal cancer Neg Hx   . Stomach cancer Neg Hx   . Esophageal cancer Neg Hx     Social History Social History   Tobacco Use  . Smoking status: Former Smoker    Packs/day: 0.75    Years: 21.00    Pack years: 15.75    Types: Cigarettes    Last attempt to quit: 12/01/2017    Years since quitting: 0.3  . Smokeless tobacco: Former Systems developer    Types: Snuff  .  Tobacco comment: Counseling sheet given in exam room   Substance Use Topics  . Alcohol use: Yes    Alcohol/week: 0.0 standard drinks    Comment: occ  . Drug use: No     Allergies   Zithromax [azithromycin]   Review of Systems Review of Systems  Constitutional: Negative for chills, diaphoresis, fatigue and fever.  HENT: Positive for facial swelling and trouble swallowing. Negative for congestion, rhinorrhea, sneezing and voice change.   Eyes: Negative.   Respiratory: Positive for shortness of breath. Negative for cough and chest tightness.   Cardiovascular: Negative for chest pain and leg swelling.  Gastrointestinal: Negative for abdominal pain, blood in stool, diarrhea, nausea and vomiting.  Genitourinary: Negative for difficulty urinating, flank pain, frequency and hematuria.  Musculoskeletal: Negative for arthralgias and back pain.  Skin: Negative for rash.  Neurological: Negative for dizziness, speech difficulty, weakness, numbness and headaches.     Physical Exam Updated Vital Signs BP (!) 148/87   Pulse 66   Temp 97.6 F (36.4 C) (Oral)   Resp 19   SpO2 100%   Physical Exam  Constitutional: He is oriented to person, place, and time. He appears well-developed and  well-nourished.  HENT:  Head: Normocephalic and atraumatic.  Mouth/Throat: Uvula is midline and oropharynx is clear and moist.  No visible edema to the tongue, lips or uvula, no trismus there is mild erythema to the posterior pharynx, uvula is midline  Eyes: Pupils are equal, round, and reactive to light.  Neck: Normal range of motion. Neck supple.  Cardiovascular: Normal rate, regular rhythm and normal heart sounds.  Pulmonary/Chest: Effort normal and breath sounds normal. No respiratory distress. He has no wheezes. He has no rales. He exhibits no tenderness.  Abdominal: Soft. Bowel sounds are normal. There is no tenderness. There is no rebound and no guarding.  Musculoskeletal: Normal range of motion. He exhibits no edema.  Lymphadenopathy:    He has no cervical adenopathy.  Neurological: He is alert and oriented to person, place, and time.  Skin: Skin is warm and dry. No rash noted.  Psychiatric: He has a normal mood and affect.     ED Treatments / Results  Labs (all labs ordered are listed, but only abnormal results are displayed) Labs Reviewed  BASIC METABOLIC PANEL - Abnormal; Notable for the following components:      Result Value   Potassium 3.4 (*)    Glucose, Bld 103 (*)    Calcium 8.6 (*)    All other components within normal limits  GROUP A STREP BY PCR  CBC WITH DIFFERENTIAL/PLATELET    EKG None  Radiology Ct Soft Tissue Neck W Contrast  Result Date: 04/19/2018 CLINICAL DATA:  Throat swelling EXAM: CT NECK WITH CONTRAST TECHNIQUE: Multidetector CT imaging of the neck was performed using the standard protocol following the bolus administration of intravenous contrast. CONTRAST:  17m OMNIPAQUE IOHEXOL 300 MG/ML  SOLN COMPARISON:  None. FINDINGS: PHARYNX AND LARYNX: --Nasopharynx: Mild prominence of the adenoid tonsils. --Oral cavity and oropharynx: Prominent palatine and lingual tonsils. The uvula is adherent to the left palatine tonsil. The tongue appears  enlarged. --Hypopharynx: Crowding of the vallecula by the lingual tonsils. --Larynx: Normal epiglottis and pre-epiglottic space. Normal aryepiglottic and vocal folds. --Retropharyngeal space: No abscess, effusion or lymphadenopathy. SALIVARY GLANDS: --Parotid: Multiple right parotid calcifications. Normal left parotid gland. --Submandibular: Symmetric without inflammation. No sialolithiasis or ductal dilatation. --Sublingual: Normal. No ranula or other visible lesion of the base of tongue and floor  of mouth. THYROID: Normal. LYMPH NODES: No enlarged or abnormal density lymph nodes. VASCULAR: Major cervical vessels are patent. LIMITED INTRACRANIAL: Normal. VISUALIZED ORBITS: Normal. MASTOIDS AND VISUALIZED PARANASAL SINUSES: Moderate opacification of the left sphenoid sinus. SKELETON: No bony spinal canal stenosis. No lytic or blastic lesions. UPPER CHEST: Clear. OTHER: None. IMPRESSION: 1. Lingual edema and tonsillar enlargement. These findings may be secondary to drug related angioedema or acute tonsillopharyngitis. 2. No severe airway compromise.  Normal epiglottis. Electronically Signed   By: Ulyses Jarred M.D.   On: 04/19/2018 21:50    Procedures Procedures (including critical care time)  Medications Ordered in ED Medications  methylPREDNISolone sodium succinate (SOLU-MEDROL) 125 mg/2 mL injection 125 mg (125 mg Intravenous Given 04/19/18 1953)  diphenhydrAMINE (BENADRYL) injection 25 mg (25 mg Intravenous Given 04/19/18 1953)  famotidine (PEPCID) IVPB 20 mg premix (0 mg Intravenous Stopped 04/19/18 2033)  acetaminophen (TYLENOL) tablet 1,000 mg (1,000 mg Oral Given 04/19/18 2100)  iohexol (OMNIPAQUE) 300 MG/ML solution 75 mL (75 mLs Intravenous Contrast Given 04/19/18 2121)     Initial Impression / Assessment and Plan / ED Course  I have reviewed the triage vital signs and the nursing notes.  Pertinent labs & imaging results that were available during my care of the patient were reviewed by  me and considered in my medical decision making (see chart for details).     Patient is a 47 year old male who presents with swelling to the back of his throat.  It sounds like he may have had some stridor earlier this morning but has not had any since then.  He has no trouble swallowing his secretions.  His rapid strep is negative.  He does not have any swelling of his lips or visible swelling of his tongue.  Given that this presentation was a little atypical, I did do a CT scan of his soft tissue neck which showed some edema to the posterior tongue and tonsils.  I feel the symptoms are most consistent with angioedema versus infectious etiology.  He was given Solu-Medrol, Benadryl and Pepcid.  He is feeling a little bit better after that but still has a sensation of swelling.  Patient will need admission for observation.  I spoke with Dr. Si Raider who will admit the pt.  CRITICAL CARE Performed by: Malvin Johns Total critical care time: 60 minutes Critical care time was exclusive of separately billable procedures and treating other patients. Critical care was necessary to treat or prevent imminent or life-threatening deterioration. Critical care was time spent personally by me on the following activities: development of treatment plan with patient and/or surrogate as well as nursing, discussions with consultants, evaluation of patient's response to treatment, examination of patient, obtaining history from patient or surrogate, ordering and performing treatments and interventions, ordering and review of laboratory studies, ordering and review of radiographic studies, pulse oximetry and re-evaluation of patient's condition.   Final Clinical Impressions(s) / ED Diagnoses   Final diagnoses:  Angioedema, initial encounter    ED Discharge Orders    None       Malvin Johns, MD 04/19/18 2314

## 2018-04-20 ENCOUNTER — Other Ambulatory Visit: Payer: Self-pay

## 2018-04-20 DIAGNOSIS — T783XXA Angioneurotic edema, initial encounter: Secondary | ICD-10-CM | POA: Diagnosis not present

## 2018-04-20 LAB — BASIC METABOLIC PANEL
BUN / CREAT RATIO: 10 (ref 9–20)
BUN: 10 mg/dL (ref 6–24)
CHLORIDE: 103 mmol/L (ref 96–106)
CO2: 21 mmol/L (ref 20–29)
Calcium: 9 mg/dL (ref 8.7–10.2)
Creatinine, Ser: 1.01 mg/dL (ref 0.76–1.27)
GFR calc Af Amer: 103 mL/min/{1.73_m2} (ref >59)
GFR calc non Af Amer: 89 mL/min/{1.73_m2} (ref >59)
GLUCOSE: 86 mg/dL (ref 65–99)
POTASSIUM: 4.2 mmol/L (ref 3.5–5.2)
Sodium: 144 mmol/L (ref 134–144)

## 2018-04-20 LAB — IRON AND TIBC
Iron: 66 ug/dL (ref 45–182)
SATURATION RATIOS: 16 % — AB (ref 17.9–39.5)
TIBC: 407 ug/dL (ref 250–450)
UIBC: 341 ug/dL

## 2018-04-20 LAB — VITAMIN B12: VITAMIN B 12: 320 pg/mL (ref 232–1245)

## 2018-04-20 LAB — HIV ANTIBODY (ROUTINE TESTING W REFLEX): HIV SCREEN 4TH GENERATION: NONREACTIVE

## 2018-04-20 LAB — FOLATE: FOLATE: 9.6 ng/mL (ref >3.0)

## 2018-04-20 MED ORDER — FAMOTIDINE 20 MG PO TABS
40.0000 mg | ORAL_TABLET | Freq: Every day | ORAL | Status: DC
  Administered 2018-04-20 – 2018-04-21 (×2): 40 mg via ORAL
  Filled 2018-04-20 (×2): qty 2

## 2018-04-20 MED ORDER — SODIUM CHLORIDE 0.9% FLUSH
3.0000 mL | Freq: Two times a day (BID) | INTRAVENOUS | Status: DC
  Administered 2018-04-20 (×3): 3 mL via INTRAVENOUS

## 2018-04-20 MED ORDER — VERAPAMIL HCL ER 180 MG PO TBCR
360.0000 mg | EXTENDED_RELEASE_TABLET | Freq: Every day | ORAL | Status: DC
  Administered 2018-04-20 – 2018-04-21 (×2): 360 mg via ORAL
  Filled 2018-04-20 (×2): qty 2

## 2018-04-20 MED ORDER — SODIUM CHLORIDE 0.9 % IV SOLN: 250.0000 mL | INTRAVENOUS | Status: DC | PRN

## 2018-04-20 MED ORDER — PREDNISONE 50 MG PO TABS
50.0000 mg | ORAL_TABLET | Freq: Every day | ORAL | Status: DC
  Administered 2018-04-21: 50 mg via ORAL
  Filled 2018-04-20: qty 1

## 2018-04-20 MED ORDER — DOXAZOSIN MESYLATE 8 MG PO TABS
8.0000 mg | ORAL_TABLET | Freq: Every day | ORAL | Status: DC
  Administered 2018-04-21: 8 mg via ORAL
  Filled 2018-04-20 (×2): qty 1

## 2018-04-20 MED ORDER — RIZATRIPTAN BENZOATE 10 MG PO TBDP: 10.0000 mg | ORAL_TABLET | Freq: Three times a day (TID) | ORAL | Status: DC | PRN

## 2018-04-20 MED ORDER — CYCLOBENZAPRINE HCL 10 MG PO TABS
10.0000 mg | ORAL_TABLET | Freq: Three times a day (TID) | ORAL | Status: DC | PRN
  Administered 2018-04-20: 10 mg via ORAL
  Filled 2018-04-20: qty 1

## 2018-04-20 MED ORDER — DIPHENHYDRAMINE HCL 50 MG PO CAPS
50.0000 mg | ORAL_CAPSULE | Freq: Three times a day (TID) | ORAL | Status: DC
  Administered 2018-04-20 – 2018-04-21 (×3): 50 mg via ORAL
  Filled 2018-04-20 (×3): qty 1

## 2018-04-20 MED ORDER — SUMATRIPTAN SUCCINATE 50 MG PO TABS
100.0000 mg | ORAL_TABLET | ORAL | Status: DC | PRN
  Administered 2018-04-20: 100 mg via ORAL
  Filled 2018-04-20 (×3): qty 2

## 2018-04-20 MED ORDER — PROCHLORPERAZINE 25 MG RE SUPP
25.0000 mg | Freq: Two times a day (BID) | RECTAL | Status: DC | PRN
  Filled 2018-04-20: qty 1

## 2018-04-20 MED ORDER — PANTOPRAZOLE SODIUM 40 MG PO TBEC
40.0000 mg | DELAYED_RELEASE_TABLET | Freq: Every day | ORAL | Status: DC
  Administered 2018-04-20 – 2018-04-21 (×2): 40 mg via ORAL
  Filled 2018-04-20 (×2): qty 1

## 2018-04-20 MED ORDER — SODIUM CHLORIDE 0.9% FLUSH: 3.0000 mL | INTRAVENOUS | Status: DC | PRN

## 2018-04-20 MED ORDER — HYDRALAZINE HCL 50 MG PO TABS
50.0000 mg | ORAL_TABLET | Freq: Three times a day (TID) | ORAL | Status: DC
  Administered 2018-04-20 – 2018-04-21 (×3): 50 mg via ORAL
  Filled 2018-04-20 (×3): qty 1

## 2018-04-20 MED ORDER — MERCAPTOPURINE 50 MG PO TABS
50.0000 mg | ORAL_TABLET | Freq: Every day | ORAL | Status: DC
  Administered 2018-04-20 – 2018-04-21 (×2): 50 mg via ORAL
  Filled 2018-04-20 (×2): qty 1

## 2018-04-20 NOTE — Progress Notes (Signed)
PROGRESS NOTE    Craig Guzman  UMP:[id number] DOB: [birth year] DOA: 04/19/2018 PCP: [name], MD   Brief Narrative: Patient is a 47 year old male with past medical history of Crohn's disease, hypertension, OSA, former smoker who presents to the emergency department with complaints of dry mouth and trouble breathing.  CT imaging done on presentation showed Lingual edema and tonsillar enlargement.  Patient admitted for the management of angioedema.  Assessment & Plan:   Principal Problem:   Angioedema Active Problems:   Common migraine without aura   Hypertension   Morbid obesity ([place])   Crohn's disease of small intestine with other complication (HCC)  Angioedema: Presented with above symptoms.  CT finding as above.  His symptoms are better today.  Most likely this was associated with the lisinopril.  Lisinopril has been discontinued.  Continue steroids, Pepcid and Benadryl for now.  We will continue to observe him tonight.  Hypertension: Lisinopril has been discontinued.  He was noted to be hypertensive today.  Started on hydralazine.  Continue verapamil and doxazosin.  Migraine headache: Continue triptan  OSA: Not on CPAP at home.  He says he cannot tolerate.  Crohn's disease: Stable.  Continue home medications.   DVT prophylaxis: ted hose,ambulatory Code Status:Full  Family Communication: None present at the  bedside Disposition Plan: Home tomorrow   Consultants: None  Procedures:None  Antimicrobials:None  Subjective: Patient seen and examined at bedside this morning.  Symptoms have been better but is still feeling dryness of the mouth.  Noted to be mildly hypertensive today.  Objective: Vitals:   04/20/18 0000 04/20/18 0052 04/20/18 0607 04/20/18 0832  BP: (!) 154/93 (!) 166/99 (!) 166/87   Pulse: (!) 58 70 (!) 52   Resp: 19 (!) 21 (!) 22   Temp:  (!) 97.5 F (36.4 C) 97.6 F (36.4 C)   TempSrc:  Oral Oral   SpO2: 100% 96% 99% 96%    Intake/Output  Summary (Last 24 hours) at 04/20/2018 1138 Last data filed at 04/20/2018 0820 Gross per 24 hour  Intake 1130 ml  Output -  Net 1130 ml   There were no vitals filed for this visit.  Examination:  General exam: Appears calm and comfortable ,Not in distress,obese HEENT:PERRL,Oral mucosa moist, Ear/Nose normal on gross exam Respiratory system: Bilateral equal air entry, normal vesicular breath sounds, no wheezes or crackles  Cardiovascular system: S1 & S2 heard, RRR. No JVD, murmurs, rubs, gallops or clicks. No pedal edema. Gastrointestinal system: Abdomen is nondistended, soft and nontender. No organomegaly or masses felt. Normal bowel sounds heard. Central nervous system: Alert and oriented. No focal neurological deficits. Extremities: No edema, no clubbing ,no cyanosis, distal peripheral pulses palpable. Skin: No rashes, lesions or ulcers,no icterus ,no pallor MSK: Normal muscle bulk,tone ,power Psychiatry: Judgement and insight appear normal. Mood & affect appropriate.     Data Reviewed: I have personally reviewed following labs and imaging studies  CBC: Recent Labs  Lab 04/19/18 1954  WBC 4.6  NEUTROABS 1.8  HGB 14.0  HCT 42.8  MCV 84.3  PLT 400   Basic Metabolic Panel: Recent Labs  Lab 04/19/18 1730 04/19/18 1954  NA 144 139  K 4.2 3.4*  CL 103 107  CO2 21 25  GLUCOSE 86 103*  BUN 10 11  CREATININE 1.01 1.02  CALCIUM 9.0 8.6*   GFR: Estimated Creatinine Clearance: 138.4 mL/min (by C-G formula based on SCr of 1.02 mg/dL). Liver Function Tests: No results for input(s): AST, ALT, ALKPHOS, BILITOT, PROT,  ALBUMIN in the last 168 hours. No results for input(s): LIPASE, AMYLASE in the last 168 hours. No results for input(s): AMMONIA in the last 168 hours. Coagulation Profile: No results for input(s): INR, PROTIME in the last 168 hours. Cardiac Enzymes: No results for input(s): CKTOTAL, CKMB, CKMBINDEX, TROPONINI in the last 168 hours. BNP (last 3 results) No  results for input(s): PROBNP in the last 8760 hours. HbA1C: No results for input(s): HGBA1C in the last 72 hours. CBG: No results for input(s): GLUCAP in the last 168 hours. Lipid Profile: No results for input(s): CHOL, HDL, LDLCALC, TRIG, CHOLHDL, LDLDIRECT in the last 72 hours. Thyroid Function Tests: No results for input(s): TSH, T4TOTAL, FREET4, T3FREE, THYROIDAB in the last 72 hours. Anemia Panel: Recent Labs    04/19/18 1718 04/19/18 1730 04/20/18 0600  VITAMINB12  --  320  --   FOLATE 9.6  --   --   TIBC  --   --  407  IRON  --   --  66   Sepsis Labs: No results for input(s): PROCALCITON, LATICACIDVEN in the last 168 hours.  Recent Results (from the past 240 hour(s))  Group A Strep by PCR     Status: None   Collection Time: 04/19/18  7:54 PM  Result Value Ref Range Status   Group A Strep by PCR NOT DETECTED NOT DETECTED Final    Comment: Performed at [facility], [place] [address] [place], [place] [address]         Radiology Studies: Ct Soft Tissue Neck W Contrast  Result Date: 04/19/2018 CLINICAL DATA:  Throat swelling EXAM: CT NECK WITH CONTRAST TECHNIQUE: Multidetector CT imaging of the neck was performed using the standard protocol following the bolus administration of intravenous contrast. CONTRAST:  65m OMNIPAQUE IOHEXOL 300 MG/ML  SOLN COMPARISON:  None. FINDINGS: PHARYNX AND LARYNX: --Nasopharynx: Mild prominence of the adenoid tonsils. --Oral cavity and oropharynx: Prominent palatine and lingual tonsils. The uvula is adherent to the left palatine tonsil. The tongue appears enlarged. --Hypopharynx: Crowding of the vallecula by the lingual tonsils. --Larynx: Normal epiglottis and pre-epiglottic space. Normal aryepiglottic and vocal folds. --Retropharyngeal space: No abscess, effusion or lymphadenopathy. SALIVARY GLANDS: --Parotid: Multiple right parotid calcifications. Normal left parotid gland. --Submandibular: Symmetric without inflammation.  No sialolithiasis or ductal dilatation. --Sublingual: Normal. No ranula or other visible lesion of the base of tongue and floor of mouth. THYROID: Normal. LYMPH NODES: No enlarged or abnormal density lymph nodes. VASCULAR: Major cervical vessels are patent. LIMITED INTRACRANIAL: Normal. VISUALIZED ORBITS: Normal. MASTOIDS AND VISUALIZED PARANASAL SINUSES: Moderate opacification of the left sphenoid sinus. SKELETON: No bony spinal canal stenosis. No lytic or blastic lesions. UPPER CHEST: Clear. OTHER: None. IMPRESSION: 1. Lingual edema and tonsillar enlargement. These findings may be secondary to drug related angioedema or acute tonsillopharyngitis. 2. No severe airway compromise.  Normal epiglottis. Electronically Signed   By: KUlyses JarredM.D.   On: 04/19/2018 21:50        Scheduled Meds: . diphenhydrAMINE  50 mg Oral Q8H  . doxazosin  8 mg Oral Daily  . famotidine  40 mg Oral Daily  . hydrALAZINE  50 mg Oral Q8H  . mercaptopurine  50 mg Oral Daily  . pantoprazole  40 mg Oral Daily  . predniSONE  50 mg Oral Q breakfast  . sodium chloride flush  3 mL Intravenous Q12H  . verapamil  360 mg Oral Daily   Continuous Infusions: . sodium chloride  LOS: 0 days    Time spent: 25 mins.More than 50% of that time was spent in counseling and/or coordination of care.      Shelly Coss, MD [facility] Pager [phone]  If 7PM-7AM, please contact night-coverage [email or web address] Password [email or web address] 04/20/2018, 11:38 AM

## 2018-04-21 DIAGNOSIS — T783XXA Angioneurotic edema, initial encounter: Secondary | ICD-10-CM | POA: Diagnosis not present

## 2018-04-21 LAB — BASIC METABOLIC PANEL
Anion gap: 7 (ref 5–15)
BUN: 18 mg/dL (ref 6–20)
CO2: 24 mmol/L (ref 22–32)
CREATININE: 1.04 mg/dL (ref 0.61–1.24)
Calcium: 8.7 mg/dL — ABNORMAL LOW (ref 8.9–10.3)
Chloride: 108 mmol/L (ref 98–111)
GFR calc Af Amer: >60 mL/min (ref >60)
Glucose, Bld: 133 mg/dL — ABNORMAL HIGH (ref 70–99)
Potassium: 4 mmol/L (ref 3.5–5.1)
SODIUM: 139 mmol/L (ref 135–145)

## 2018-04-21 LAB — RHEUMATOID FACTOR

## 2018-04-21 MED ORDER — PREDNISONE 50 MG PO TABS: ORAL_TABLET | ORAL | 0 refills | Status: DC

## 2018-04-21 MED ORDER — HYDRALAZINE HCL 50 MG PO TABS: 50.0000 mg | ORAL_TABLET | Freq: Three times a day (TID) | ORAL | 0 refills | Status: DC

## 2018-04-21 NOTE — Discharge Summary (Signed)
Physician Discharge Summary  Craig Guzman QHU:[id number] DOB: [birth year] DOA: 04/19/2018  PCP: [name], MD  Admit date: 04/19/2018 Discharge date: 04/21/2018  Admitted From: Home Disposition:  Home  Discharge Condition:Stable CODE STATUS:FULL Diet recommendation: Heart Healthy   Brief/Interim Summary:  Patient is a 47 year old male with past medical history of Crohn's disease, hypertension, OSA, former smoker who presents to the emergency department with complaints of dry mouth and trouble breathing.  CT imaging done on presentation showed Lingual edema and tonsillar enlargement.  Patient admitted for the management of angioedema.  Angioedema was most likely induced secondary to lisinopril.  Lisinopril stopped.  Has been started hydralazine.  Patient was treated with steroids, H1/H2 blockers.  His symptoms have significantly improved this morning.  He is stable for discharge home today.  Following problems were  addressed during his hospitalization:  Angioedema: Presented with above symptoms.  CT finding as above.  His symptoms are better today.  Most likely this was associated with the lisinopril.  Lisinopril has been discontinued. Started on steroids, Pepcid and Benadryl for now.  Continue steroids short course at home.  Hypertension: Lisinopril has been discontinued.  BP better after he was started on hydralazine.  Continue verapamil and doxazosin.  Migraine headache: Continue triptan  OSA: Not on CPAP at home.  He says he cannot tolerate.  Crohn's disease: Stable.  Continue home medications  Discharge Diagnoses:  Principal Problem:   Angioedema Active Problems:   Common migraine without aura   Hypertension   Morbid obesity ([place])   Crohn's disease of small intestine with other complication [facility])    Discharge Instructions  Discharge Instructions    Diet - low sodium heart healthy   Complete by:  As directed    Discharge instructions   Complete by:  As  directed    1)Take prescribed medications as instructed.Stop lisinopril. 2) Follow up with your PCP in a week. 3) Monitor your BP at home.   Increase activity slowly   Complete by:  As directed      Allergies as of 04/21/2018      Reactions   Zithromax [azithromycin] Hypertension      Medication List    STOP taking these medications   lisinopril 40 MG tablet Commonly known as:  PRINIVIL,ZESTRIL     TAKE these medications   azelastine 0.1 % nasal spray Commonly known as:  ASTELIN PLACE 2 SPRAYS INTO BOTH NOSTRILS 2 (TWO) TIMES DAILY. USE IN EACH NOSTRIL AS DIRECTED What changed:    when to take this  reasons to take this   cetirizine 10 MG tablet Commonly known as:  ZYRTEC Take 10 mg by mouth daily as needed for allergies.   clotrimazole 10 MG troche Commonly known as:  MYCELEX Take 1 tablet (10 mg total) by mouth 5 (five) times daily.   cyclobenzaprine 10 MG tablet Commonly known as:  FLEXERIL Take 10 mg by mouth 3 (three) times daily as needed for muscle spasms.   doxazosin 8 MG tablet Commonly known as:  CARDURA Take 1 tablet (8 mg total) by mouth daily.   hydrALAZINE 50 MG tablet Commonly known as:  APRESOLINE Take 1 tablet (50 mg total) by mouth every 8 (eight) hours.   mercaptopurine 50 MG tablet Commonly known as:  PURINETHOL Take 1 tablet (50 mg total) by mouth daily. Give on an empty stomach 1 hour before or 2 hours after meals.   omeprazole-sodium bicarbonate 40-1100 MG capsule Commonly known as:  ZEGERID TAKE 1 CAPSULE  BY MOUTH AT BEDTIME. What changed:    when to take this  reasons to take this   ondansetron 4 MG disintegrating tablet Commonly known as:  ZOFRAN-ODT TAKE 1 TABLET BY MOUTH EVERY 8 HOURS AS NEEDED FOR NAUSEA AND VOMITING What changed:  See the new instructions.   predniSONE 50 MG tablet Commonly known as:  DELTASONE Take daily for 4 more days Start taking on:  04/22/2018   prochlorperazine 25 MG suppository Commonly  known as:  COMPAZINE Place 1 suppository (25 mg total) rectally every 12 (twelve) hours as needed for nausea or vomiting.   rizatriptan 10 MG disintegrating tablet Commonly known as:  MAXALT-MLT TAKE 1 TABLET (10 MG TOTAL) BY MOUTH 3 (THREE) TIMES DAILY AS NEEDED FOR MIGRAINE.   ustekinumab 90 MG/ML Sosy injection Commonly known as:  STELARA Inject 1 mL (90 mg total) into the skin every 8 (eight) weeks.   verapamil 180 MG CR tablet Commonly known as:  CALAN-SR Take 2 tablets (360 mg total) by mouth daily.      Follow-up Information    [name], MD. Schedule an appointment as soon as possible for a visit in 1 week(s).   Specialties:  Family Medicine, Sports Medicine Contact information: [place] [address] [phone]          Allergies  Allergen Reactions  . Zithromax [Azithromycin] Hypertension    Consultations:  None   Procedures/Studies: Ct Soft Tissue Neck W Contrast  Result Date: 04/19/2018 CLINICAL DATA:  Throat swelling EXAM: CT NECK WITH CONTRAST TECHNIQUE: Multidetector CT imaging of the neck was performed using the standard protocol following the bolus administration of intravenous contrast. CONTRAST:  81m OMNIPAQUE IOHEXOL 300 MG/ML  SOLN COMPARISON:  None. FINDINGS: PHARYNX AND LARYNX: --Nasopharynx: Mild prominence of the adenoid tonsils. --Oral cavity and oropharynx: Prominent palatine and lingual tonsils. The uvula is adherent to the left palatine tonsil. The tongue appears enlarged. --Hypopharynx: Crowding of the vallecula by the lingual tonsils. --Larynx: Normal epiglottis and pre-epiglottic space. Normal aryepiglottic and vocal folds. --Retropharyngeal space: No abscess, effusion or lymphadenopathy. SALIVARY GLANDS: --Parotid: Multiple right parotid calcifications. Normal left parotid gland. --Submandibular: Symmetric without inflammation. No sialolithiasis or ductal dilatation. --Sublingual: Normal. No ranula or other visible  lesion of the base of tongue and floor of mouth. THYROID: Normal. LYMPH NODES: No enlarged or abnormal density lymph nodes. VASCULAR: Major cervical vessels are patent. LIMITED INTRACRANIAL: Normal. VISUALIZED ORBITS: Normal. MASTOIDS AND VISUALIZED PARANASAL SINUSES: Moderate opacification of the left sphenoid sinus. SKELETON: No bony spinal canal stenosis. No lytic or blastic lesions. UPPER CHEST: Clear. OTHER: None. IMPRESSION: 1. Lingual edema and tonsillar enlargement. These findings may be secondary to drug related angioedema or acute tonsillopharyngitis. 2. No severe airway compromise.  Normal epiglottis. Electronically Signed   By: KUlyses JarredM.D.   On: 04/19/2018 21:50       Subjective: Patient seen and examined the bedside this morning.  Remains comfortable.  Symptoms are better today.  Hemodynamically stable.  Stable for discharge.  Discharge Exam: Vitals:   04/20/18 2028 04/21/18 0456  BP: (!) 155/87 132/84  Pulse: 65 72  Resp: 17 14  Temp: 98.3 F (36.8 C) 97.9 F (36.6 C)  SpO2: 95% 99%   Vitals:   04/20/18 0832 04/20/18 1324 04/20/18 2028 04/21/18 0456  BP:  (!) 161/93 (!) 155/87 132/84  Pulse:  80 65 72  Resp:   17 14  Temp:  98.4 F (36.9 C) 98.3 F (36.8 C)  97.9 F (36.6 C)  TempSrc:  Oral Oral Oral  SpO2: 96% 98% 95% 99%    General: Pt is alert, awake, not in acute distress,obese Cardiovascular: RRR, S1/S2 +, no rubs, no gallops Respiratory: CTA bilaterally, no wheezing, no rhonchi Abdominal: Soft, NT, ND, bowel sounds + Extremities: no edema, no cyanosis    The results of significant diagnostics from this hospitalization (including imaging, microbiology, ancillary and laboratory) are listed below for reference.     Microbiology: Recent Results (from the past 240 hour(s))  Group A Strep by PCR     Status: None   Collection Time: 04/19/18  7:54 PM  Result Value Ref Range Status   Group A Strep by PCR NOT DETECTED NOT DETECTED Final    Comment:  Performed at [facility], [place] [address], [place], [place] [address]     Labs: BNP (last 3 results) No results for input(s): BNP in the last 8760 hours. Basic Metabolic Panel: Recent Labs  Lab 04/19/18 1730 04/19/18 1954 04/21/18 0524  NA 144 139 139  K 4.2 3.4* 4.0  CL 103 107 108  CO2 21 25 24   GLUCOSE 86 103* 133*  BUN 10 11 18   CREATININE 1.01 1.02 1.04  CALCIUM 9.0 8.6* 8.7*   Liver Function Tests: No results for input(s): AST, ALT, ALKPHOS, BILITOT, PROT, ALBUMIN in the last 168 hours. No results for input(s): LIPASE, AMYLASE in the last 168 hours. No results for input(s): AMMONIA in the last 168 hours. CBC: Recent Labs  Lab 04/19/18 1954  WBC 4.6  NEUTROABS 1.8  HGB 14.0  HCT 42.8  MCV 84.3  PLT 205   Cardiac Enzymes: No results for input(s): CKTOTAL, CKMB, CKMBINDEX, TROPONINI in the last 168 hours. BNP: Invalid input(s): POCBNP CBG: No results for input(s): GLUCAP in the last 168 hours. D-Dimer No results for input(s): DDIMER in the last 72 hours. Hgb A1c No results for input(s): HGBA1C in the last 72 hours. Lipid Profile No results for input(s): CHOL, HDL, LDLCALC, TRIG, CHOLHDL, LDLDIRECT in the last 72 hours. Thyroid function studies No results for input(s): TSH, T4TOTAL, T3FREE, THYROIDAB in the last 72 hours.  Invalid input(s): FREET3 Anemia work up Recent Labs    04/19/18 1718 04/19/18 1730 04/20/18 0600  VITAMINB12  --  320  --   FOLATE 9.6  --   --   TIBC  --   --  407  IRON  --   --  66   Urinalysis    Component Value Date/Time   COLORURINE YELLOW 03/25/2018 1426   APPEARANCEUR CLEAR 03/25/2018 1426   LABSPEC 1.020 03/25/2018 1426   PHURINE 6.5 03/25/2018 1426   GLUCOSEU NEGATIVE 03/25/2018 1426   HGBUR NEGATIVE 03/25/2018 1426   BILIRUBINUR NEGATIVE 03/25/2018 1426   BILIRUBINUR small (A) 03/12/2018 1145   BILIRUBINUR small 12/01/2014 2106   KETONESUR NEGATIVE 03/25/2018 1426   PROTEINUR =30 (A)  03/12/2018 1145   PROTEINUR NEGATIVE 02/11/2018 0050   UROBILINOGEN 1.0 03/25/2018 1426   NITRITE NEGATIVE 03/25/2018 1426   LEUKOCYTESUR NEGATIVE 03/25/2018 1426   Sepsis Labs Invalid input(s): PROCALCITONIN,  WBC,  LACTICIDVEN Microbiology Recent Results (from the past 240 hour(s))  Group A Strep by PCR     Status: None   Collection Time: 04/19/18  7:54 PM  Result Value Ref Range Status   Group A Strep by PCR NOT DETECTED NOT DETECTED Final    Comment: Performed at [facility], [address] [place], [place]  [address]    Please note: You were cared for by a hospitalist during your hospital stay. Once you are discharged, your primary care physician will handle any further medical issues. Please note that NO REFILLS for any discharge medications will be authorized once you are discharged, as it is imperative that you return to your primary care physician (or establish a relationship with a primary care physician if you do not have one) for your post hospital discharge needs so that they can reassess your need for medications and monitor your lab values.    Time coordinating discharge: 40 minutes  SIGNED:   Shelly Coss, MD  [facility] 04/21/2018, 10:36 AM Pager [phone]  If 7PM-7AM, please contact night-coverage [email or web address] Password [email or web address]

## 2018-04-22 LAB — SJOGRENS SYNDROME-A EXTRACTABLE NUCLEAR ANTIBODY

## 2018-04-22 LAB — SJOGRENS SYNDROME-B EXTRACTABLE NUCLEAR ANTIBODY: SSB (La) (ENA) Antibody, IgG: <0.2 AI (ref 0.0–0.9)

## 2018-04-24 ENCOUNTER — Emergency Department (HOSPITAL_COMMUNITY)
Admission: EM | Admit: 2018-04-24 | Discharge: 2018-04-24 | Disposition: A | Discharge status: Home / Self Care | Payer: Managed Care, Other (non HMO) | Attending: Emergency Medicine | Admitting: Emergency Medicine

## 2018-04-24 ENCOUNTER — Encounter (HOSPITAL_COMMUNITY): Payer: Self-pay

## 2018-04-24 ENCOUNTER — Other Ambulatory Visit: Payer: Self-pay

## 2018-04-24 ENCOUNTER — Telehealth: Payer: Self-pay | Admitting: Family Medicine

## 2018-04-24 DIAGNOSIS — I10 Essential (primary) hypertension: Secondary | ICD-10-CM | POA: Diagnosis not present

## 2018-04-24 DIAGNOSIS — Z9049 Acquired absence of other specified parts of digestive tract: Secondary | ICD-10-CM | POA: Insufficient documentation

## 2018-04-24 DIAGNOSIS — F419 Anxiety disorder, unspecified: Secondary | ICD-10-CM | POA: Diagnosis not present

## 2018-04-24 DIAGNOSIS — H66003 Acute suppurative otitis media without spontaneous rupture of ear drum, bilateral: Secondary | ICD-10-CM | POA: Diagnosis not present

## 2018-04-24 DIAGNOSIS — Z79899 Other long term (current) drug therapy: Secondary | ICD-10-CM | POA: Insufficient documentation

## 2018-04-24 DIAGNOSIS — Z87891 Personal history of nicotine dependence: Secondary | ICD-10-CM | POA: Insufficient documentation

## 2018-04-24 DIAGNOSIS — R5383 Other fatigue: Secondary | ICD-10-CM | POA: Diagnosis present

## 2018-04-24 LAB — BASIC METABOLIC PANEL
Anion gap: 9 (ref 5–15)
BUN: 13 mg/dL (ref 6–20)
CALCIUM: 8.7 mg/dL — AB (ref 8.9–10.3)
CO2: 24 mmol/L (ref 22–32)
CREATININE: 0.88 mg/dL (ref 0.61–1.24)
Chloride: 105 mmol/L (ref 98–111)
GFR calc non Af Amer: >60 mL/min (ref >60)
Glucose, Bld: 122 mg/dL — ABNORMAL HIGH (ref 70–99)
Potassium: 3.9 mmol/L (ref 3.5–5.1)
SODIUM: 138 mmol/L (ref 135–145)

## 2018-04-24 LAB — CBC WITH DIFFERENTIAL/PLATELET
Abs Immature Granulocytes: 0.07 10*3/uL (ref 0.00–0.07)
BASOS ABS: 0 10*3/uL (ref 0.0–0.1)
Basophils Relative: 0 %
EOS ABS: 0 10*3/uL (ref 0.0–0.5)
EOS PCT: 0 %
HEMATOCRIT: 48.2 % (ref 39.0–52.0)
Hemoglobin: 14.9 g/dL (ref 13.0–17.0)
Immature Granulocytes: 1 %
Lymphocytes Relative: 19 %
Lymphs Abs: 1.2 10*3/uL (ref 0.7–4.0)
MCH: 26 pg (ref 26.0–34.0)
MCHC: 30.9 g/dL (ref 30.0–36.0)
MCV: 84.1 fL (ref 80.0–100.0)
MONO ABS: 0.4 10*3/uL (ref 0.1–1.0)
Monocytes Relative: 6 %
NRBC: 0 % (ref 0.0–0.2)
Neutro Abs: 4.8 10*3/uL (ref 1.7–7.7)
Neutrophils Relative %: 74 %
Platelets: 214 10*3/uL (ref 150–400)
RBC: 5.73 MIL/uL (ref 4.22–5.81)
RDW: 14.2 % (ref 11.5–15.5)
WBC: 6.5 10*3/uL (ref 4.0–10.5)

## 2018-04-24 MED ORDER — DIPHENHYDRAMINE HCL 50 MG/ML IJ SOLN
25.0000 mg | Freq: Once | INTRAMUSCULAR | Status: AC
  Administered 2018-04-24: 25 mg via INTRAVENOUS
  Filled 2018-04-24: qty 1

## 2018-04-24 MED ORDER — SODIUM CHLORIDE 0.9 % IV BOLUS
1000.0000 mL | Freq: Once | INTRAVENOUS | Status: AC
  Administered 2018-04-24: 1000 mL via INTRAVENOUS

## 2018-04-24 MED ORDER — AMOXICILLIN-POT CLAVULANATE 875-125 MG PO TABS: 1.0000 | ORAL_TABLET | Freq: Two times a day (BID) | ORAL | 0 refills | Status: DC

## 2018-04-24 MED ORDER — KETOROLAC TROMETHAMINE 30 MG/ML IJ SOLN
30.0000 mg | Freq: Once | INTRAMUSCULAR | Status: AC
  Administered 2018-04-24: 30 mg via INTRAVENOUS
  Filled 2018-04-24: qty 1

## 2018-04-24 MED ORDER — PROCHLORPERAZINE EDISYLATE 10 MG/2ML IJ SOLN
10.0000 mg | Freq: Once | INTRAMUSCULAR | Status: AC
  Administered 2018-04-24: 10 mg via INTRAVENOUS
  Filled 2018-04-24: qty 2

## 2018-04-24 NOTE — ED Provider Notes (Signed)
Patient placed in Quick Look pathway, seen and evaluated   Chief Complaint: Anxiety  HPI: Jiovanny Burdell is a 47 y.o. male with hx of Crohn's, HTN who presents to the ED with anxiety. Pt states that he has been feeling jittery and crying in his sleep. Pt was seen recently at [facility] r/t allergic reaction to Lisinopril. Patient's medication was changed while in the hospital. Patient has been taking his medication as directed.  Pt has been weak and c/o HTN at home as well, family checked it with a reading in the 259T systolic but diastolic has improved. patient reports going to a meeting at work and his hands were shaking but then he almost went to sleep and thought he was just sleepy. However, when he got home he still felt the same.   ROS: Psych: anxiety  Neuro: weakness, sleepy  Physical Exam:  BP (!) 159/93 (BP Location: Right Wrist)   Pulse 86   Temp 98.6 F (37 C) (Oral)   Resp 18   Ht 6' (1.829 m)   Wt (!) 154.2 kg   SpO2 98%   BMI 46.11 kg/m    Gen: No distress  Neuro: Awake and Alert  Skin: Warm and dry  Heart regular rate and rhythm  Initiation of care has begun. The patient has been counseled on the process, plan, and necessity for staying for the completion/evaluation, and the remainder of the medical screening examination    Ashley Murrain, NP 04/24/18 1623    Mesner, Corene Cornea, MD 04/24/18 2330

## 2018-04-24 NOTE — ED Triage Notes (Signed)
Pt states that he has been feeling jittery and crying in his sleep. Pt was seen recently at [facility] r/t allergic reaction to Lisinopril. Pt has been weak and c/o HTN at home as well, family checked it with a reading in the 170s.

## 2018-04-24 NOTE — ED Notes (Signed)
Patient verbalizes understanding of medications and discharge instructions. No further questions at this time. VSS and patient ambulatory at discharge.   

## 2018-04-24 NOTE — ED Notes (Signed)
ED Provider at bedside. 

## 2018-04-24 NOTE — Telephone Encounter (Signed)
Per CRM # [id number] - I called pt's wife (per DPR) and spoke with her regarding a hospital F/U for her husband. We did not have any appts available today. Wife stated that he needed to be seen today as he was having a lot of anxiety. I recommended going to an Urgent Care or back to the ED if she had concerns about his well being. Wife stated that she would probably take him back to the ED.

## 2018-04-24 NOTE — ED Provider Notes (Signed)
[facility] EMERGENCY DEPARTMENT Provider Note   CSN: [id number] Arrival date & time: 04/24/18  1524     History   Chief Complaint Chief Complaint  Patient presents with  . Anxiety    HPI Craig Guzman is a 47 y.o. male with a history of OSA noncompliant with CPAP, Crohn's disease on Stelara, GERD, HTN, migraines, Colloid thyroid nodule presents to the emergency department with a chief complaint of malaise with headache, neck pain, otalgia, heart racing, and feeling jittery.  The patient reports that he was admitted at [facility] from 10/18-20/19 angioedema secondary to lisinopril. Lisinoprill has since been stopped and the patient was started on prednisone, hydralazine.   The patient endorses generalized malaise over the last few days this started prior to his admission for angioedema.  He also endorses a bilateral temporal headache with some right occipital pain.  He states that when he moves his head to the left or to the right that his vision gets "jittery".  The sensation stops when he is not moving his head.  No other known aggravating or alleviating factors.  He denies neck stiffness, diplopia, blurred vision, loss of vision, sinus pain or pressure, sore throat, chest pain, dyspnea, dizziness, weakness, tinnitus, weakness, numbness, or slurred speech.  He is also concerned that his blood pressure has been running in the 150s and 160s since he was discharged despite taking hydralazine.  He does have a history of migraines and did take 1 tablet of his abortive medication last night without improvements in his symptoms.  He states that he typically gets nausea and photophobia with his migraines, which he is not currently having.  The history is provided by the patient. No language interpreter was used.    Past Medical History:  Diagnosis Date  . Allergy    trees/ grasses/ animals/dust/mold  . Anxiety   . Biliary dyskinesia   . Colloid thyroid nodule   .  Crohn's disease ([place])    Stomach, terminal ileum, cecum  . Gastric ulcer    antral  . GERD (gastroesophageal reflux disease)   . History of migraine headaches   . HTN (hypertension)   . Kidney stone 09/20/2012  . Migraine   . Obesity   . Pneumonia 02/27/2014   ED  . Sleep apnea    has cpap- does not wear     Patient Active Problem List   Diagnosis Date Noted  . Angioedema 04/19/2018  . Crohn's disease of small intestine with other complication ([place]) 37/10/8887  . Duodenal stricture   . Intractable vomiting with nausea   . Cigarette nicotine dependence without complication 16/94/5038  . Shifting sleep-work schedule, affecting sleep 05/15/2016  . Sleep deprivation 05/15/2016  . Sleep related hypoventilation in conditions classified elsewhere 05/15/2016  . Sleep related headaches 05/15/2016  . Morbid obesity ([place]) 09/19/2013  . Diarrhea 07/10/2013  . 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 Hypertension 11/26/2012  . Gastroparesis??? 12/08/2011  . Common migraine without aura 12/08/2011  . Long-term use of immunosuppressant medication 08/08/2011  . Gastroduodenal Crohn's disease with intestinal obstruction ([place]) 05/17/2011  . GERD (gastroesophageal reflux disease) 04/21/2011    Past Surgical History:  Procedure Laterality Date  . BALLOON DILATION N/A 12/06/2017   Procedure: BALLOON DILATION;  Surgeon: Gatha Mayer, MD;  Location: [facility];  Service: Endoscopy;  Laterality: N/A;  . CHOLECYSTECTOMY  2011   [facility]  . COLONOSCOPY  07/26/11   Crohn's colitis, ileitis  . ESOPHAGOGASTRODUODENOSCOPY  05/04/11, 07/26/11  granulomatous gastritis - Crohn's  . ESOPHAGOGASTRODUODENOSCOPY (EGD) WITH PROPOFOL N/A 03/28/2017   Procedure: ESOPHAGOGASTRODUODENOSCOPY (EGD) WITH PROPOFOL  with balloon dilation of duodenum;  Surgeon: [name], MD;  Location: [facility];  Service: Gastroenterology;  Laterality: N/A;  . ESOPHAGOGASTRODUODENOSCOPY (EGD) WITH PROPOFOL N/A 12/06/2017    Procedure: ESOPHAGOGASTRODUODENOSCOPY (EGD) WITH PROPOFOL;  Surgeon: Gatha Mayer, MD;  Location: [facility];  Service: Endoscopy;  Laterality: N/A;  . FLEXIBLE SIGMOIDOSCOPY    . MULTIPLE TOOTH EXTRACTIONS  2005  . SHOULDER SURGERY     right  . UPPER GASTROINTESTINAL ENDOSCOPY    . VASECTOMY     bilateral w/lysis of penile adhesions        Home Medications    Prior to Admission medications   Medication Sig Start Date End Date Taking? Authorizing Provider  amoxicillin-clavulanate (AUGMENTIN) 875-125 MG tablet Take 1 tablet by mouth every 12 (twelve) hours. 04/24/18   Safari Cinque A, PA-C  azelastine (ASTELIN) 0.1 % nasal spray PLACE 2 SPRAYS INTO BOTH NOSTRILS 2 (TWO) TIMES DAILY. USE IN EACH NOSTRIL AS DIRECTED Patient taking differently: Place 2 sprays into both nostrils 2 (two) times daily as needed for allergies. Use in each nostril as directed 01/04/18   [name], MD  cetirizine (ZYRTEC) 10 MG tablet Take 10 mg by mouth daily as needed for allergies.  11/30/17   [provider]  clotrimazole (MYCELEX) 10 MG troche Take 1 tablet (10 mg total) by mouth 5 (five) times daily. 04/19/18   [name], MD  cyclobenzaprine (FLEXERIL) 10 MG tablet Take 10 mg by mouth 3 (three) times daily as needed for muscle spasms.    [provider]  doxazosin (CARDURA) 8 MG tablet Take 1 tablet (8 mg total) by mouth daily. 03/01/18   [name], MD  hydrALAZINE (APRESOLINE) 50 MG tablet Take 1 tablet (50 mg total) by mouth every 8 (eight) hours. 04/21/18 05/21/18  Shelly Coss, MD  mercaptopurine (PURINETHOL) 50 MG tablet Take 1 tablet (50 mg total) by mouth daily. Give on an empty stomach 1 hour before or 2 hours after meals. 05/02/17   Gatha Mayer, MD  omeprazole-sodium bicarbonate (ZEGERID) 40-1100 MG capsule TAKE 1 CAPSULE BY MOUTH AT BEDTIME. Patient taking differently: Take 1 capsule by mouth at bedtime as needed (indigestion).  04/25/17   [name], MD  ondansetron (ZOFRAN-ODT) 4 MG disintegrating tablet TAKE 1 TABLET BY MOUTH EVERY 8 HOURS AS NEEDED FOR NAUSEA AND VOMITING Patient taking differently: Take 4 mg by mouth every 8 (eight) hours as needed for nausea or vomiting.  01/15/18   Gatha Mayer, MD  predniSONE (DELTASONE) 50 MG tablet Take daily for 4 more days 04/22/18   Shelly Coss, MD  prochlorperazine (COMPAZINE) 25 MG suppository Place 1 suppository (25 mg total) rectally every 12 (twelve) hours as needed for nausea or vomiting. 03/19/17   Gatha Mayer, MD  rizatriptan (MAXALT-MLT) 10 MG disintegrating tablet TAKE 1 TABLET (10 MG TOTAL) BY MOUTH 3 (THREE) TIMES DAILY AS NEEDED FOR MIGRAINE. 05/09/17   Kathrynn Ducking, MD  ustekinumab (STELARA) 90 MG/ML SOSY injection Inject 1 mL (90 mg total) into the skin every 8 (eight) weeks. 07/25/17   Gatha Mayer, MD  verapamil (CALAN-SR) 180 MG CR tablet Take 2 tablets (360 mg total) by mouth daily. 11/08/17   [name], MD    Family History Family History  Problem Relation Age of Onset  . Colon polyps Father   .  Diabetes Father   . Hypertension Father   . Hypertension Mother   . Asthma Mother   . Heart disease Maternal Grandmother   . Colon cancer Neg Hx   . Rectal cancer Neg Hx   . Stomach cancer Neg Hx   . Esophageal cancer Neg Hx     Social History Social History   Tobacco Use  . Smoking status: Former Smoker    Packs/day: 0.75    Years: 21.00    Pack years: 15.75    Types: Cigarettes    Last attempt to quit: 12/01/2017    Years since quitting: 0.3  . Smokeless tobacco: Former Systems developer    Types: Snuff  . Tobacco comment: Counseling sheet given in exam room   Substance Use Topics  . Alcohol use: Yes    Alcohol/week: 0.0 standard drinks    Comment: occ  . Drug use: No   Allergies   Lisinopril and Zithromax [azithromycin]   Review of Systems Review of Systems  Constitutional: Negative for appetite change and fever.       Malaise  HENT:  Positive for ear pain. Negative for congestion, drooling, postnasal drip, sinus pressure, sinus pain, sore throat, trouble swallowing and voice change.   Eyes: Negative for visual disturbance.  Respiratory: Negative for shortness of breath.   Cardiovascular: Negative for chest pain.  Gastrointestinal: Negative for abdominal pain, diarrhea, nausea and vomiting.  Genitourinary: Negative for dysuria.  Musculoskeletal: Negative for arthralgias, back pain, myalgias and neck stiffness.  Skin: Negative for rash.  Allergic/Immunologic: Negative for immunocompromised state.  Neurological: Positive for headaches. Negative for dizziness, weakness and numbness.  Psychiatric/Behavioral: Negative for confusion.   Physical Exam Updated Vital Signs BP (!) 176/85 (BP Location: Left Arm)   Pulse 71   Temp 98.6 F (37 C) (Oral)   Resp 16   Ht 6' (1.829 m)   Wt (!) 154.2 kg   SpO2 100%   BMI 46.11 kg/m   Physical Exam  Constitutional: He appears well-developed.  Well appearing.   HENT:  Head: Normocephalic.  Right Ear: External ear normal. Tympanic membrane is erythematous and bulging.  Left Ear: External ear normal. Tympanic membrane is erythematous and bulging.  Nose: Nose normal. Right sinus exhibits no maxillary sinus tenderness and no frontal sinus tenderness. Left sinus exhibits no maxillary sinus tenderness and no frontal sinus tenderness.  Mouth/Throat: Uvula is midline, oropharynx is clear and moist and mucous membranes are normal.  No mastoid tenderness bilaterally.  Eyes: Pupils are equal, round, and reactive to light. Conjunctivae and EOM are normal.  Neck: Normal range of motion. Neck supple.  No meningismus  Cardiovascular: Normal rate, regular rhythm, normal heart sounds and intact distal pulses. Exam reveals no gallop and no friction rub.  No murmur heard. Pulmonary/Chest: Effort normal and breath sounds normal. No stridor. No respiratory distress. He has no wheezes. He has no  rales. He exhibits no tenderness.  Abdominal: Soft. He exhibits no distension and no mass. There is no tenderness. There is no rebound and no guarding. No hernia.  Lymphadenopathy:    He has no cervical adenopathy.  Neurological: He is alert.  Alert and oriented x4.  Cranial nerves II through XII are grossly intact.  Ambulatory without difficulty.  Negative Romberg.  No pronator drift.  5 out of 5 strength against resistance of the bilateral upper and lower extremities.  Sensation is intact and equal throughout.  No clonus bilaterally.  No dysmetria with finger-to-nose bilaterally.  Speaks in complete,  goal oriented sentences.  Moves all 4 extremities.  Skin: Skin is warm and dry.  Psychiatric: His behavior is normal. His mood appears not anxious.  Nursing note and vitals reviewed.  ED Treatments / Results  Labs (all labs ordered are listed, but only abnormal results are displayed) Labs Reviewed  BASIC METABOLIC PANEL - Abnormal; Notable for the following components:      Result Value   Glucose, Bld 122 (*)    Calcium 8.7 (*)    All other components within normal limits  CBC WITH DIFFERENTIAL/PLATELET    EKG None  Radiology No results found.  Procedures Procedures (including critical care time)  Medications Ordered in ED Medications  ketorolac (TORADOL) 30 MG/ML injection 30 mg (30 mg Intravenous Given 04/24/18 2053)  prochlorperazine (COMPAZINE) injection 10 mg (10 mg Intravenous Given 04/24/18 2054)  diphenhydrAMINE (BENADRYL) injection 25 mg (25 mg Intravenous Given 04/24/18 2052)  sodium chloride 0.9 % bolus 1,000 mL (0 mLs Intravenous Stopped 04/24/18 2213)     Initial Impression / Assessment and Plan / ED Course  I have reviewed the triage vital signs and the nursing notes.  Pertinent labs & imaging results that were available during my care of the patient were reviewed by me and considered in my medical decision making (see chart for details).     47 year old  male with a history of OSA noncompliant with CPAP, Crohn's disease, GERD, HTN, migraines, Colloid thyroid nodule senting with generalized malaise, right otalgia, and feeling jittery.  He is chronically on immunosuppressants for Crohn's disease.  He was admitted within the last week for angioedema secondary to lisinopril.  His lisinopril was changed to hydralazine and he reports his blood pressure has been increased over the last few days.  On exam, the patient has erythema and bulging to the bilateral TMs, which could explain the patient's headache.  He was treated with a migraine cocktail of Toradol, Compazine, Benadryl, and a fluid bolus as he reports he has not been eating and drinking as much since his hospitalization.  On reevaluation, he reports that his headache has resolved.  On exam, he had no focal neurologic deficits.  I suspect his symptoms are secondary to bilateral AOM.  He has no mastoid tenderness bilaterally.  Doubt mastoiditis, sinusitis, or meningitis.  He does not appear anxious.  Jittery feeling could be secondary to prednisone use but the patient states that he has used it before for Crohn's disease without similar symptoms.  Sensation is also improved since migraine cocktail and IV fluid administration.  Blood pressure recheck following treatment with IV fluid bolus was 176/85.  I suspect this is secondary to fluid administration and he is due for his dose of hydralazine at this time.  Recommended follow-up with his pediatrician for recheck of his blood pressure in 2-3 days.  Will treat AOM with Augmentin.  Doubt urticaria, anaphylaxis at this time.  He is hemodynamically stable and in no acute distress.  He is safe for discharge home with outpatient follow-up at this time.  Final Clinical Impressions(s) / ED Diagnoses   Final diagnoses:  Non-recurrent acute suppurative otitis media of both ears without spontaneous rupture of tympanic membranes    ED Discharge Orders          Ordered    amoxicillin-clavulanate (AUGMENTIN) 875-125 MG tablet  Every 12 hours     04/24/18 2205           Joline Maxcy A, PA-C 04/25/18 0322    Sherwood Gambler,  MD 04/29/18 9810

## 2018-04-24 NOTE — Discharge Instructions (Signed)
Thank you for allowing me to care for you today in the Emergency Department.   Take 1 tablet of Augmentin 2 times daily for the next 7 days.  Please follow-up with your primary care provider for a recheck in the next 3 to 4 days.  You can take Tylenol or ibuprofen for headache or pain.  Return to the emergency department if you develop new or worsening symptoms including loss of hearing in one or both ears, stiffness in the neck, high fever, double vision or changes in your vision, or other new, concerning symptoms.

## 2018-04-26 ENCOUNTER — Encounter: Payer: Self-pay | Admitting: Family Medicine

## 2018-04-26 ENCOUNTER — Ambulatory Visit: Payer: Self-pay | Admitting: *Deleted

## 2018-04-26 ENCOUNTER — Ambulatory Visit (INDEPENDENT_AMBULATORY_CARE_PROVIDER_SITE_OTHER): Payer: Managed Care, Other (non HMO) | Admitting: Family Medicine

## 2018-04-26 ENCOUNTER — Other Ambulatory Visit: Payer: Self-pay

## 2018-04-26 ENCOUNTER — Ambulatory Visit (INDEPENDENT_AMBULATORY_CARE_PROVIDER_SITE_OTHER): Payer: Managed Care, Other (non HMO)

## 2018-04-26 VITALS — BP 144/92 | HR 83 | Temp 98.0°F | Ht 72.0 in | Wt 364.4 lb

## 2018-04-26 DIAGNOSIS — I10 Essential (primary) hypertension: Secondary | ICD-10-CM | POA: Diagnosis not present

## 2018-04-26 DIAGNOSIS — R002 Palpitations: Secondary | ICD-10-CM

## 2018-04-26 DIAGNOSIS — R0602 Shortness of breath: Secondary | ICD-10-CM

## 2018-04-26 NOTE — Progress Notes (Signed)
10/25/201911:19 AM  Craig Guzman [birth year], 47 y.o. male [id number]  Chief Complaint  Patient presents with  . Follow-up    went to ER for ear pain. Having high bp, concerned of the bp medication he is taking    HPI:   Patient is a 47 y.o. male with past medical history significant for HTN, chrons, OSA, former smoker, migraines who presents today for ER followup  Seen on 04/24/18 for bilateral AOM, on augmentin  Hospitalized on 04/19/18 - for angioedema from ACE  Having some diarrhea since started abx Still having ear presssure Denies any ear drainage + nasal congestion, not doing azelastine No fever but has been having chills No cough + SOB, no h/o asthma or COPD No swelling of feet but hand fell swollen  Completed prednisone yesterday at 5am No chest pain unless he is having palpitations Has been waking up because he was having palpitations  CBC and BMP normal except for low calcium   Fall Risk  04/26/2018 04/19/2018 04/12/2018 03/12/2018 03/01/2018  Falls in the past year? No No No No No  Risk for fall due to : - - - - -     Depression screen [facility] 2/9 04/26/2018 04/19/2018 04/12/2018  Decreased Interest 0 0 0  Down, Depressed, Hopeless 0 0 0  PHQ - 2 Score 0 0 0    Allergies  Allergen Reactions  . Lisinopril Swelling  . Zithromax [Azithromycin] Hypertension    Prior to Admission medications   Medication Sig Start Date End Date Taking? Authorizing Provider  amoxicillin-clavulanate (AUGMENTIN) 875-125 MG tablet Take 1 tablet by mouth every 12 (twelve) hours. 04/24/18  Yes McDonald, Mia A, PA-C  azelastine (ASTELIN) 0.1 % nasal spray PLACE 2 SPRAYS INTO BOTH NOSTRILS 2 (TWO) TIMES DAILY. USE IN EACH NOSTRIL AS DIRECTED Patient taking differently: Place 2 sprays into both nostrils 2 (two) times daily as needed for allergies. Use in each nostril as directed 01/04/18  Yes [name], MD  cetirizine (ZYRTEC) 10 MG tablet Take 10 mg by mouth daily as needed for  allergies.  11/30/17  Yes [provider]  cyclobenzaprine (FLEXERIL) 10 MG tablet Take 10 mg by mouth 3 (three) times daily as needed for muscle spasms.   Yes [provider]  doxazosin (CARDURA) 8 MG tablet Take 1 tablet (8 mg total) by mouth daily. 03/01/18  Yes [name], MD  hydrALAZINE (APRESOLINE) 50 MG tablet Take 1 tablet (50 mg total) by mouth every 8 (eight) hours. 04/21/18 05/21/18 Yes Shelly Coss, MD  mercaptopurine (PURINETHOL) 50 MG tablet Take 1 tablet (50 mg total) by mouth daily. Give on an empty stomach 1 hour before or 2 hours after meals. 05/02/17  Yes Gatha Mayer, MD  omeprazole-sodium bicarbonate (ZEGERID) 40-1100 MG capsule TAKE 1 CAPSULE BY MOUTH AT BEDTIME. Patient taking differently: Take 1 capsule by mouth at bedtime as needed (indigestion).  04/25/17  Yes [name], MD  ondansetron (ZOFRAN-ODT) 4 MG disintegrating tablet TAKE 1 TABLET BY MOUTH EVERY 8 HOURS AS NEEDED FOR NAUSEA AND VOMITING Patient taking differently: Take 4 mg by mouth every 8 (eight) hours as needed for nausea or vomiting.  01/15/18  Yes Gatha Mayer, MD  predniSONE (DELTASONE) 50 MG tablet Take daily for 4 more days 04/22/18  Yes Shelly Coss, MD  prochlorperazine (COMPAZINE) 25 MG suppository Place 1 suppository (25 mg total) rectally every 12 (twelve) hours as needed for nausea or vomiting. 03/19/17  Yes Gatha Mayer,  MD  rizatriptan (MAXALT-MLT) 10 MG disintegrating tablet TAKE 1 TABLET (10 MG TOTAL) BY MOUTH 3 (THREE) TIMES DAILY AS NEEDED FOR MIGRAINE. 05/09/17  Yes Kathrynn Ducking, MD  ustekinumab (STELARA) 90 MG/ML SOSY injection Inject 1 mL (90 mg total) into the skin every 8 (eight) weeks. 07/25/17  Yes Gatha Mayer, MD  verapamil (CALAN-SR) 180 MG CR tablet Take 2 tablets (360 mg total) by mouth daily. 11/08/17  Yes [name], MD  clotrimazole (MYCELEX) 10 MG troche Take 1 tablet (10 mg total) by mouth 5 (five) times daily. Patient not  taking: Reported on 04/26/2018 04/19/18   [name], MD    Past Medical History:  Diagnosis Date  . Allergy    trees/ grasses/ animals/dust/mold  . Anxiety   . Biliary dyskinesia   . Colloid thyroid nodule   . Crohn's disease ([place])    Stomach, terminal ileum, cecum  . Gastric ulcer    antral  . GERD (gastroesophageal reflux disease)   . History of migraine headaches   . HTN (hypertension)   . Kidney stone 09/20/2012  . Migraine   . Obesity   . Pneumonia 02/27/2014   ED  . Sleep apnea    has cpap- does not wear     Past Surgical History:  Procedure Laterality Date  . BALLOON DILATION N/A 12/06/2017   Procedure: BALLOON DILATION;  Surgeon: Gatha Mayer, MD;  Location: [facility];  Service: Endoscopy;  Laterality: N/A;  . CHOLECYSTECTOMY  2011   [place]  . COLONOSCOPY  07/26/11   Crohn's colitis, ileitis  . ESOPHAGOGASTRODUODENOSCOPY  05/04/11, 07/26/11   granulomatous gastritis - Crohn's  . ESOPHAGOGASTRODUODENOSCOPY (EGD) WITH PROPOFOL N/A 03/28/2017   Procedure: ESOPHAGOGASTRODUODENOSCOPY (EGD) WITH PROPOFOL  with balloon dilation of duodenum;  Surgeon: [name], MD;  Location: [facility];  Service: Gastroenterology;  Laterality: N/A;  . ESOPHAGOGASTRODUODENOSCOPY (EGD) WITH PROPOFOL N/A 12/06/2017   Procedure: ESOPHAGOGASTRODUODENOSCOPY (EGD) WITH PROPOFOL;  Surgeon: Gatha Mayer, MD;  Location: [facility];  Service: Endoscopy;  Laterality: N/A;  . FLEXIBLE SIGMOIDOSCOPY    . MULTIPLE TOOTH EXTRACTIONS  2005  . SHOULDER SURGERY     right  . UPPER GASTROINTESTINAL ENDOSCOPY    . VASECTOMY     bilateral w/lysis of penile adhesions    Social History   Tobacco Use  . Smoking status: Former Smoker    Packs/day: 0.75    Years: 21.00    Pack years: 15.75    Types: Cigarettes    Last attempt to quit: 12/01/2017    Years since quitting: 0.4  . Smokeless tobacco: Former Systems developer    Types: Snuff  . Tobacco comment: Counseling sheet given in exam  room   Substance Use Topics  . Alcohol use: Yes    Alcohol/week: 0.0 standard drinks    Comment: occ    Family History  Problem Relation Age of Onset  . Colon polyps Father   . Diabetes Father   . Hypertension Father   . Hypertension Mother   . Asthma Mother   . Heart disease Maternal Grandmother   . Colon cancer Neg Hx   . Rectal cancer Neg Hx   . Stomach cancer Neg Hx   . Esophageal cancer Neg Hx     ROS Per  hpi  OBJECTIVE:  Blood pressure (!) 144/92, pulse 83, temperature 98 F (36.7 C), temperature source Oral, height 6' (1.829 m), weight (!) 364 lb 6.4 oz (165.3 kg), SpO2 97 %,  peak flow 650 L/min. Body mass index is 49.42 kg/m.   Physical Exam  Constitutional: He is oriented to person, place, and time. He appears well-developed and well-nourished.  HENT:  Head: Normocephalic and atraumatic.  Right Ear: Hearing, tympanic membrane, external ear and ear canal normal.  Left Ear: Hearing, tympanic membrane, external ear and ear canal normal.  Mouth/Throat: Oropharynx is clear and moist. No oropharyngeal exudate.  Eyes: Pupils are equal, round, and reactive to light. Conjunctivae and EOM are normal.  Neck: Neck supple.  Cardiovascular: Normal rate and regular rhythm. Exam reveals no gallop and no friction rub.  No murmur heard. Pulmonary/Chest: Effort normal and breath sounds normal. He has no wheezes. He has no rales.  Musculoskeletal: He exhibits no edema.  Lymphadenopathy:    He has no cervical adenopathy.  Neurological: He is alert and oriented to person, place, and time.  Skin: Skin is warm and dry.  Psychiatric: He has a normal mood and affect.  Nursing note and vitals reviewed.  My interpretation of EKG:  NSR, HR 73, no ST changes   Dg Chest 2 View  Result Date: 04/26/2018 CLINICAL DATA:  Shortness of breath. Palpitations. Recent angio edema. EXAM: CHEST - 2 VIEW COMPARISON:  02/11/2018 FINDINGS: The cardiomediastinal silhouette is unchanged with top  normal heart size. The lungs are mildly hypoinflated with mild diffuse prominence of the bronchovascular markings but no overt edema, focal airspace consolidation, pleural effusion, or pneumothorax identified. No acute osseous abnormality is seen. IMPRESSION: No active cardiopulmonary disease. Electronically Signed   By: Logan Bores M.D.   On: 04/26/2018 12:20    Peak flow reading is 650, about 111 % of predicted.  ASSESSMENT and PLAN  1. Palpitations Discussed with patient that reassuring exam and workup in clinic today. Seems related to recent prednisone use. Should start improving/resolviing now that he has completed course. RTC precautions reviewed - EKG 12-Lead - DG Chest 2 View; Future - TSH  2. SOB (shortness of breath) see #1 - EKG 12-Lead - DG Chest 2 View; Future  3. Hypocalcemia - Calcium, ionized  4. Essential hypertension Slightly above goal. Might be related to pred. Recheck nurse visit in 1-2 weeks. - Care order/instruction:  Other orders - Check Peak Flow  Return for Dr Carlota Raspberry routine followup, nurse BP check in 1-2 weeks.    Rutherford Guys, MD [facility] [place], [place] [address] Ph.  [phone] Fax [phone]

## 2018-04-26 NOTE — Telephone Encounter (Signed)
Wife called in with pt there in background. She is requesting an appt for today because husband is having bad anxiety.   He didn't even go to work today as a result of the anxiety.   Wife also requesting to be evaluated for elevated BP.   175/80 an hour prior to calling. He went to the ED.  This was 04/19/18.   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   He was admitted to the hospital for 2 days.   They changed his medication from lisinopril to hydralazine.   Since then his BP has continued to be elevated. Wife has been taking BP manually at home using the cuff on his lower arm due to him having a large upper arm and the cuff does not fit it properly. Wife called [facility] on 04/24/18 to get an appt but there were no openings so she was advised to take him to the ED.     At the ED he was diagnosed with bilateral ear infection.  I scheduled him with Dr. Pamella Pert for today at 10:20.   I went over the s/s to look for to return to the ED if they occur prior to the appt today.   Wife verbalized understanding and was agreeable to this plan.   She thanked me for my help in getting him in to be seen today.    Reason for Disposition . [1] Taking BP medications AND [2] feels is having side effects (e.g., impotence, cough, dizzy upon standing)  Answer Assessment - Initial Assessment Questions 1. BLOOD PRESSURE: "What is the blood pressure?" "Did you take at least two measurements 5 minutes apart?"     175/80 this morning. 2. ONSET: "When did you take your blood pressure?"     1 hour ago. 3. HOW: "How did you obtain the blood pressure?" (e.g., visiting nurse, automatic home BP monitor)     Manual BP machine used on wrist area due to upper part of arm is large.   Wife is an LPN.    4. HISTORY: "Do you have a history of high blood pressure?"     Yes   He went to the hospital and was diagnosed with  bilateral ear infection.   Lisinopril changed to hydrozine due to BP being elevated.   5. MEDICATIONS: "Are you taking any medications for blood pressure?" "Have you missed any doses recently?"     Yes see above.   BP still staying elevated.     Last Friday saw Dr. Carlota Raspberry and was sent to the hospital and was admitted.  Discharged Sunday.    His BP is staying elevated.    Wife took him to ED on Wednesday because no openings at Dr. Vonna Kotyk office.   He is unable to go to work today.    BP still elevated and he is having anxiety bad.    He was on prednisone for throat swelling and lisinopril he was on that they gave to him in the ED.   Last dose was Thursday 5am.    6. OTHER SYMPTOMS: "Do you have any symptoms?" (e.g., headache, chest pain, blurred vision, difficulty breathing, weakness)     He is breathing hard due to the anxiety this morning. 7. PREGNANCY: "Is there any chance you are pregnant?" "When was your last menstrual period?"     N/A  Protocols used: HIGH BLOOD PRESSURE-A-AH

## 2018-04-26 NOTE — Patient Instructions (Signed)
° ° ° °  If you have lab work done today you will be contacted with your lab results within the next 2 weeks.  If you have not heard from us then please contact us. The fastest way to get your results is to register for My Chart. ° ° °IF you received an x-ray today, you will receive an invoice from [facility]. Please contact  Radiology at [phone] with questions or concerns regarding your invoice.  ° °IF you received labwork today, you will receive an invoice from [facility]. Please contact [facility] at [phone] with questions or concerns regarding your invoice.  ° °Our billing staff will not be able to assist you with questions regarding bills from these companies. ° °You will be contacted with the lab results as soon as they are available. The fastest way to get your results is to activate your My Chart account. Instructions are located on the last page of this paperwork. If you have not heard from us regarding the results in 2 weeks, please contact this office. °  ° ° ° °

## 2018-04-27 LAB — CALCIUM, IONIZED: Calcium, Ion: 4.9 mg/dL (ref 4.5–5.6)

## 2018-04-27 LAB — TSH: TSH: 1.85 u[IU]/mL (ref 0.450–4.500)

## 2018-04-29 ENCOUNTER — Ambulatory Visit: Payer: Self-pay

## 2018-04-29 ENCOUNTER — Other Ambulatory Visit: Payer: Self-pay

## 2018-04-29 DIAGNOSIS — I10 Essential (primary) hypertension: Secondary | ICD-10-CM

## 2018-04-29 MED ORDER — TRIAMTERENE-HCTZ 37.5-25 MG PO TABS: 1.0000 | ORAL_TABLET | Freq: Every day | ORAL | 3 refills | Status: DC

## 2018-04-29 NOTE — Telephone Encounter (Signed)
Please advise 

## 2018-04-29 NOTE — Telephone Encounter (Addendum)
Pt wife Craig Guzman wanting to know if its ok for her husband to take the hydrochlorothiazide he had pancreatitis before and was told not to take this medicine it in the triamterene-hydrochlorothiazide (MAXZIDE-25) 37.5-25 MG tablet  Pt's wife asked if Maxide- 25 is taking the place of the hydralazine or he is to take both. Pt is allergic to lisinopril and completed steroid pack. Pt's BP this am 159/78. Pt is c/o headache to the back of his head and took his migraine medicine (Maxalt) and that has helped with the headache.  Pt is currently on Augmentin for ear ache.  Wife is requesting a call back regarding stopping or continuing the Maxide. Please call pt at [phone]  Reason for Disposition . [1] Caller requesting NON-URGENT health information AND [2] PCP's office is the best resource  Answer Assessment - Initial Assessment Questions 1. REASON FOR CALL or QUESTION: "What is your reason for calling today?" or "How can I best help you?" or "What question do you have that I can help answer?" Pt wife Craig Guzman wanting to know if its ok for her husband to take the hydrochlorothiazide he had pancreatitis before and was told not to take this medicine it in the triamterene-hydrochlorothiazide (MAXZIDE-25) 37.5-25 MG tablet  Protocols used: INFORMATION ONLY CALL-A-AH

## 2018-04-30 MED ORDER — OLMESARTAN MEDOXOMIL 20 MG PO TABS: 20.0000 mg | ORAL_TABLET | Freq: Every day | ORAL | 1 refills | Status: DC

## 2018-04-30 NOTE — Addendum Note (Signed)
Addended by: Merri Ray R on: 04/30/2018 07:30 PM   Modules accepted: Orders

## 2018-04-30 NOTE — Telephone Encounter (Signed)
I do see that in past HCTZ was held due to concern of pancreatitis association. Have him

## 2018-04-30 NOTE — Telephone Encounter (Signed)
Previous message interrupted:  I do see that in past there was a concern for possible idiopathic pancreatitis and HCTZ use.  With angioedema, would not recommend ACE inhibitors.  Potentially could consider ARB with close monitoring for any recurrence of angioedema symptoms although that would be less likely based on more recent studies. Stop triamterene HCT.   I will order olmesartan 76m QD.  Keep a record blood pressures outside of the office and bring them to follow up in next week.  If BP over 160/100, be seen right away.  Although unlikely, watch for any return of swelling symptoms. If those occur - go to emergency room.   Above info also sent to patient by Mychart.

## 2018-05-01 ENCOUNTER — Ambulatory Visit (HOSPITAL_COMMUNITY)
Admission: EM | Admit: 2018-05-01 | Discharge: 2018-05-01 | Disposition: A | Discharge status: Home / Self Care | Payer: Managed Care, Other (non HMO) | Attending: Family Medicine | Admitting: Family Medicine

## 2018-05-01 ENCOUNTER — Encounter (HOSPITAL_COMMUNITY): Payer: Self-pay

## 2018-05-01 ENCOUNTER — Other Ambulatory Visit: Payer: Self-pay | Admitting: Family Medicine

## 2018-05-01 ENCOUNTER — Other Ambulatory Visit: Payer: Self-pay

## 2018-05-01 ENCOUNTER — Telehealth: Payer: Self-pay

## 2018-05-01 DIAGNOSIS — H6982 Other specified disorders of Eustachian tube, left ear: Secondary | ICD-10-CM

## 2018-05-01 DIAGNOSIS — R351 Nocturia: Secondary | ICD-10-CM

## 2018-05-01 DIAGNOSIS — I10 Essential (primary) hypertension: Secondary | ICD-10-CM

## 2018-05-01 DIAGNOSIS — H6992 Unspecified Eustachian tube disorder, left ear: Secondary | ICD-10-CM

## 2018-05-01 MED ORDER — FLUTICASONE PROPIONATE 50 MCG/ACT NA SUSP: 1.0000 | Freq: Every day | NASAL | 2 refills | Status: DC

## 2018-05-01 MED ORDER — CETIRIZINE HCL 10 MG PO TABS: 10.0000 mg | ORAL_TABLET | Freq: Every day | ORAL | 1 refills | Status: DC | PRN

## 2018-05-01 NOTE — ED Triage Notes (Signed)
Pt states his left ear hurts. Pt has been to the ER for this last Wednesday. Pt was told he has a double ear infection.

## 2018-05-01 NOTE — Telephone Encounter (Signed)
Spoke with [facility]good at [facility] no escript received on 04/30/18, pt and wife at pharmacy to get med and its not there.  Phoned in Olmesartan 20 mg one po qd #30 with 1 refill. Dgaddy, CMA

## 2018-05-01 NOTE — Discharge Instructions (Addendum)
We will try Flonase and Zyrtec daily for your symptoms He can continue to use the Astelin nasal spray If this does not work for you Nasacort is also a good option that she can get over-the-counter If no improvement with these treatments you may want to follow-up with the ear nose and throat physician.

## 2018-05-01 NOTE — ED Provider Notes (Signed)
[place]    CSN: [id number] Arrival date & time: 05/01/18  6834     History   Chief Complaint Chief Complaint  Patient presents with  . Otalgia    HPI Craig Guzman is a 47 y.o. male.   Patient is a 47 year old male presents for continued left ear pain.  He was seen on 04/24/2018 in the emergency department and diagnosed with bilateral otitis media.  He is still taking the Augmentin that was prescribed.  He reports that the right ear has improved but he still has remaining left ear discomfort.  He describes it as dull/aching and then sharp at times.  He also describes some popping and fullness.  He is here for recheck to ensure that the infection has cleared.  Patient also has a history of severe allergies and takes Zyrtec and Astelin.  He denies any associated fever, chills, body aches and fatigue.  He did take some ibuprofen for pain on Monday which helps some.  He has not taken anything today.   ROS per HPI      Past Medical History:  Diagnosis Date  . Allergy    trees/ grasses/ animals/dust/mold  . Anxiety   . Biliary dyskinesia   . Colloid thyroid nodule   . Crohn's disease ([place])    Stomach, terminal ileum, cecum  . Gastric ulcer    antral  . GERD (gastroesophageal reflux disease)   . History of migraine headaches   . HTN (hypertension)   . Kidney stone 09/20/2012  . Migraine   . Obesity   . Pneumonia 02/27/2014   ED  . Sleep apnea    has cpap- does not wear     Patient Active Problem List   Diagnosis Date Noted  . Angioedema 04/19/2018  . Crohn's disease of small intestine with other complication ([place]) 19/62/2297  . Duodenal stricture   . Intractable vomiting with nausea   . Cigarette nicotine dependence without complication 98/92/1194  . Shifting sleep-work schedule, affecting sleep 05/15/2016  . Sleep deprivation 05/15/2016  . Sleep related hypoventilation in conditions classified elsewhere 05/15/2016  . Sleep related headaches  05/15/2016  . Morbid obesity ([place]) 09/19/2013  . Diarrhea 07/10/2013  . 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 Hypertension 11/26/2012  . Gastroparesis??? 12/08/2011  . Common migraine without aura 12/08/2011  . Long-term use of immunosuppressant medication 08/08/2011  . Gastroduodenal Crohn's disease with intestinal obstruction ([place]) 05/17/2011  . GERD (gastroesophageal reflux disease) 04/21/2011    Past Surgical History:  Procedure Laterality Date  . BALLOON DILATION N/A 12/06/2017   Procedure: BALLOON DILATION;  Surgeon: Gatha Mayer, MD;  Location: [facility];  Service: Endoscopy;  Laterality: N/A;  . CHOLECYSTECTOMY  2011   [place]  . COLONOSCOPY  07/26/11   Crohn's colitis, ileitis  . ESOPHAGOGASTRODUODENOSCOPY  05/04/11, 07/26/11   granulomatous gastritis - Crohn's  . ESOPHAGOGASTRODUODENOSCOPY (EGD) WITH PROPOFOL N/A 03/28/2017   Procedure: ESOPHAGOGASTRODUODENOSCOPY (EGD) WITH PROPOFOL  with balloon dilation of duodenum;  Surgeon: [name], MD;  Location: [facility];  Service: Gastroenterology;  Laterality: N/A;  . ESOPHAGOGASTRODUODENOSCOPY (EGD) WITH PROPOFOL N/A 12/06/2017   Procedure: ESOPHAGOGASTRODUODENOSCOPY (EGD) WITH PROPOFOL;  Surgeon: Gatha Mayer, MD;  Location: [facility];  Service: Endoscopy;  Laterality: N/A;  . FLEXIBLE SIGMOIDOSCOPY    . MULTIPLE TOOTH EXTRACTIONS  2005  . SHOULDER SURGERY     right  . UPPER GASTROINTESTINAL ENDOSCOPY    . VASECTOMY     bilateral w/lysis of penile adhesions  Home Medications    Prior to Admission medications   Medication Sig Start Date End Date Taking? Authorizing Provider  amoxicillin-clavulanate (AUGMENTIN) 875-125 MG tablet Take 1 tablet by mouth every 12 (twelve) hours. 04/24/18   McDonald, Mia A, PA-C  azelastine (ASTELIN) 0.1 % nasal spray PLACE 2 SPRAYS INTO BOTH NOSTRILS 2 (TWO) TIMES DAILY. USE IN EACH NOSTRIL AS DIRECTED Patient taking differently: Place 2 sprays into both nostrils  2 (two) times daily as needed for allergies. Use in each nostril as directed 01/04/18   [name], MD  cetirizine (ZYRTEC) 10 MG tablet Take 1 tablet (10 mg total) by mouth daily as needed for allergies. 05/01/18   Loura Halt A, NP  cyclobenzaprine (FLEXERIL) 10 MG tablet Take 10 mg by mouth 3 (three) times daily as needed for muscle spasms.    [provider]  doxazosin (CARDURA) 4 MG tablet TAKE 1 TABLET BY MOUTH EVERY DAY 05/01/18   [name], MD  doxazosin (CARDURA) 8 MG tablet Take 1 tablet (8 mg total) by mouth daily. 03/01/18   [name], MD  fluticasone (FLONASE) 50 MCG/ACT nasal spray Place 1 spray into both nostrils daily. 05/01/18   Loura Halt A, NP  hydrALAZINE (APRESOLINE) 50 MG tablet Take 1 tablet (50 mg total) by mouth every 8 (eight) hours. 04/21/18 05/21/18  Shelly Coss, MD  mercaptopurine (PURINETHOL) 50 MG tablet Take 1 tablet (50 mg total) by mouth daily. Give on an empty stomach 1 hour before or 2 hours after meals. 05/02/17   Gatha Mayer, MD  olmesartan (BENICAR) 20 MG tablet Take 1 tablet (20 mg total) by mouth daily. 04/30/18   [name], MD  omeprazole-sodium bicarbonate (ZEGERID) 40-1100 MG capsule TAKE 1 CAPSULE BY MOUTH AT BEDTIME. Patient taking differently: Take 1 capsule by mouth at bedtime as needed (indigestion).  04/25/17   [name], MD  ondansetron (ZOFRAN-ODT) 4 MG disintegrating tablet TAKE 1 TABLET BY MOUTH EVERY 8 HOURS AS NEEDED FOR NAUSEA AND VOMITING Patient taking differently: Take 4 mg by mouth every 8 (eight) hours as needed for nausea or vomiting.  01/15/18   Gatha Mayer, MD  predniSONE (DELTASONE) 50 MG tablet Take daily for 4 more days Patient not taking: Reported on 05/01/2018 04/22/18   Shelly Coss, MD  prochlorperazine (COMPAZINE) 25 MG suppository Place 1 suppository (25 mg total) rectally every 12 (twelve) hours as needed for nausea or vomiting. 03/19/17   Gatha Mayer, MD    rizatriptan (MAXALT-MLT) 10 MG disintegrating tablet TAKE 1 TABLET (10 MG TOTAL) BY MOUTH 3 (THREE) TIMES DAILY AS NEEDED FOR MIGRAINE. 05/09/17   Kathrynn Ducking, MD  ustekinumab (STELARA) 90 MG/ML SOSY injection Inject 1 mL (90 mg total) into the skin every 8 (eight) weeks. 07/25/17   Gatha Mayer, MD  verapamil (CALAN-SR) 180 MG CR tablet Take 2 tablets (360 mg total) by mouth daily. 11/08/17   [name], MD    Family History Family History  Problem Relation Age of Onset  . Colon polyps Father   . Diabetes Father   . Hypertension Father   . Hypertension Mother   . Asthma Mother   . Heart disease Maternal Grandmother   . Colon cancer Neg Hx   . Rectal cancer Neg Hx   . Stomach cancer Neg Hx   . Esophageal cancer Neg Hx     Social History Social History   Tobacco Use  . Smoking status: Former  Smoker    Packs/day: 0.75    Years: 21.00    Pack years: 15.75    Types: Cigarettes    Last attempt to quit: 12/01/2017    Years since quitting: 0.4  . Smokeless tobacco: Former Systems developer    Types: Snuff  . Tobacco comment: Counseling sheet given in exam room   Substance Use Topics  . Alcohol use: Yes    Alcohol/week: 0.0 standard drinks    Comment: occ  . Drug use: No     Allergies   Lisinopril and Zithromax [azithromycin]   Review of Systems Review of Systems   Physical Exam Triage Vital Signs ED Triage Vitals  Enc Vitals Group     BP 05/01/18 0943 133/85     Pulse Rate 05/01/18 0943 86     Resp 05/01/18 0943 18     Temp 05/01/18 0943 97.9 F (36.6 C)     Temp Source 05/01/18 0943 Oral     SpO2 05/01/18 0943 96 %     Weight --      Height --      Head Circumference --      Peak Flow --      Pain Score 05/01/18 1031 0     Pain Loc --      Pain Edu? --      Excl. in [place]? --    No data found.  Updated Vital Signs BP 133/85 (BP Location: Left Arm)   Pulse 86   Temp 97.9 F (36.6 C) (Oral)   Resp 18   SpO2 96%   Visual Acuity Right Eye Distance:    Left Eye Distance:   Bilateral Distance:    Right Eye Near:   Left Eye Near:    Bilateral Near:     Physical Exam  Constitutional: He appears well-developed and well-nourished.  Very pleasant. Non toxic or ill appearing.   HENT:  Head: Normocephalic and atraumatic.  Right Ear: External ear normal.  Left Ear: External ear normal.  Nose: Nose normal.  Mouth/Throat: Oropharynx is clear and moist.  Left TM normal Right TM normal  Eyes: Conjunctivae are normal.  Neck: Normal range of motion.  Pulmonary/Chest: Effort normal.  Musculoskeletal: Normal range of motion.  Neurological: He is alert.  Skin: Skin is warm and dry. No rash noted. No erythema. No pallor.  Psychiatric: He has a normal mood and affect.  Nursing note and vitals reviewed.    UC Treatments / Results  Labs (all labs ordered are listed, but only abnormal results are displayed) Labs Reviewed - No data to display  EKG None  Radiology No results found.  Procedures Procedures (including critical care time)  Medications Ordered in UC Medications - No data to display  Initial Impression / Assessment and Plan / UC Course  I have reviewed the triage vital signs and the nursing notes.  Pertinent labs & imaging results that were available during my care of the patient were reviewed by me and considered in my medical decision making (see chart for details).     Bilateral TMs normal It appears the otitis media has improved, no signs of infection. This is most likely eustachian tube dysfunction Patient admits he has not been taking his Zyrtec daily as prescribed We will go ahead and treat with Flonase nasal spray and have him take his Zyrtec daily Instructed he may continue the Astelin nasal spray If no improvement in symptoms he may want to follow-up with ENT physician. Final Clinical  Impressions(s) / UC Diagnoses   Final diagnoses:  Dysfunction of left eustachian tube     Discharge Instructions      We will try Flonase and Zyrtec daily for your symptoms He can continue to use the Astelin nasal spray If this does not work for you Nasacort is also a good option that she can get over-the-counter If no improvement with these treatments you may want to follow-up with the ear nose and throat physician.    ED Prescriptions    Medication Sig Dispense Auth. Provider   fluticasone (FLONASE) 50 MCG/ACT nasal spray Place 1 spray into both nostrils daily. 16 g Nisaiah Bechtol A, NP   cetirizine (ZYRTEC) 10 MG tablet Take 1 tablet (10 mg total) by mouth daily as needed for allergies. 30 tablet Loura Halt A, NP     Controlled Substance Prescriptions [place] Controlled Substance Registry consulted? Not Applicable   Orvan July, NP 05/01/18 1109

## 2018-05-02 ENCOUNTER — Other Ambulatory Visit: Payer: Self-pay | Admitting: Family Medicine

## 2018-05-02 DIAGNOSIS — I10 Essential (primary) hypertension: Secondary | ICD-10-CM

## 2018-05-08 ENCOUNTER — Encounter (HOSPITAL_COMMUNITY): Payer: Self-pay | Admitting: Emergency Medicine

## 2018-05-08 ENCOUNTER — Ambulatory Visit (HOSPITAL_COMMUNITY)
Admission: EM | Admit: 2018-05-08 | Discharge: 2018-05-08 | Disposition: A | Discharge status: Home / Self Care | Payer: Managed Care, Other (non HMO) | Attending: Family Medicine | Admitting: Family Medicine

## 2018-05-08 DIAGNOSIS — R0989 Other specified symptoms and signs involving the circulatory and respiratory systems: Secondary | ICD-10-CM | POA: Diagnosis not present

## 2018-05-08 MED ORDER — PREDNISONE 10 MG (21) PO TBPK: ORAL_TABLET | Freq: Every day | ORAL | 0 refills | Status: DC

## 2018-05-08 NOTE — ED Triage Notes (Signed)
Pt here for dysphagia feeling; pt sts hospitalized 2 weeks ago for angio edema with lisinopril; pt sts feels similar but not as severe; pt in no distress at present

## 2018-05-09 ENCOUNTER — Encounter: Payer: Self-pay | Admitting: Physician Assistant

## 2018-05-09 ENCOUNTER — Ambulatory Visit (INDEPENDENT_AMBULATORY_CARE_PROVIDER_SITE_OTHER): Payer: Managed Care, Other (non HMO) | Admitting: Physician Assistant

## 2018-05-09 ENCOUNTER — Other Ambulatory Visit (INDEPENDENT_AMBULATORY_CARE_PROVIDER_SITE_OTHER): Payer: Managed Care, Other (non HMO)

## 2018-05-09 VITALS — BP 166/76 | HR 112 | Ht 72.0 in | Wt 342.4 lb

## 2018-05-09 DIAGNOSIS — K5000 Crohn's disease of small intestine without complications: Secondary | ICD-10-CM | POA: Diagnosis not present

## 2018-05-09 DIAGNOSIS — R1011 Right upper quadrant pain: Secondary | ICD-10-CM

## 2018-05-09 DIAGNOSIS — K219 Gastro-esophageal reflux disease without esophagitis: Secondary | ICD-10-CM | POA: Diagnosis not present

## 2018-05-09 DIAGNOSIS — K50119 Crohn's disease of large intestine with unspecified complications: Secondary | ICD-10-CM | POA: Diagnosis not present

## 2018-05-09 LAB — LIPASE: LIPASE: 8 U/L — AB (ref 11.0–59.0)

## 2018-05-09 LAB — HEPATIC FUNCTION PANEL
ALBUMIN: 4.1 g/dL (ref 3.5–5.2)
ALT: 28 U/L (ref 0–53)
AST: 20 U/L (ref 0–37)
Alkaline Phosphatase: 130 U/L — ABNORMAL HIGH (ref 39–117)
BILIRUBIN TOTAL: 0.4 mg/dL (ref 0.2–1.2)
Bilirubin, Direct: 0.1 mg/dL (ref 0.0–0.3)
Total Protein: 8 g/dL (ref 6.0–8.3)

## 2018-05-09 LAB — AMYLASE: AMYLASE: 29 U/L (ref 27–131)

## 2018-05-09 MED ORDER — METOCLOPRAMIDE HCL 10 MG PO TABS: ORAL_TABLET | ORAL | 1 refills | Status: DC

## 2018-05-09 NOTE — Patient Instructions (Addendum)
Your provider has requested that you go to the basement level for lab work before leaving today. Press "B" on the elevator. The lab is located at the first door on the left as you exit the elevator.  We sent a prescription for Metoclopramide 10 mg to [facility] pharmacy [facility] [facility].   We made you a follow up appointment with Dr. Silvano Rusk for 06-13-2018 at 2:45 PM.   Normal BMI (Body Mass Index- based on height and weight) is between 19 and 25. Your BMI today is Body mass index is 46.43 kg/m. Marland Kitchen Please consider follow up  regarding your BMI with your Primary Care Provider.

## 2018-05-09 NOTE — Progress Notes (Signed)
Chief Complaint: "Choking on my spit", right upper quadrant pain  HPI:    Craig Guzman is a 47 year old African-American male with a past medical history as listed below, well known to [name] for his gastroduodenal Crohn's disease with intestinal obstruction, who presents to clinic today with a complaint of "choking on my spit" and right upper quadrant pain.    Today, explains that he had an allergic reaction to lisinopril about 2 weeks ago where his throat and tongue became very swollen.  He was started on Prednisone which helped with this reaction.  He then developed a double ear infection over the past few weeks.  He tells me that his throat was feeling some better but over the past 2 to 3 days he started to feel as though it was "closing up again" and he was started back on steroids yesterday.  Explains the reason he is here is because for the past 2 to 3 days at night he has been laying down and feels like he is "choked in his sleep with stomach acid".  He also has vomiting of the acid after one of these incidents and does feel like "my spit won't go down/it goes down the wrong hole" during the day, but has no trouble with food.  Currently only using Zegerid nightly.    Also complains of a chronic right upper quadrant pain over the past 6 months.  Tells me this was evaluated with colonoscopy which was normal but this pain seems to radiate from his back around to his front and tends to come and go.  Sometimes this pain is so severe that it doubles him over.  It is at its worst when he is passing a bowel movement.    Denies fever, chills, weight loss, anorexia, nausea or vomiting.  Past Medical History:  Diagnosis Date  . Allergy    trees/ grasses/ animals/dust/mold  . Anxiety   . Biliary dyskinesia   . Colloid thyroid nodule   . Crohn's disease ([place])    Stomach, terminal ileum, cecum  . Gastric ulcer    antral  . GERD (gastroesophageal reflux disease)   . History of migraine headaches    . HTN (hypertension)   . Kidney stone 09/20/2012  . Migraine   . Obesity   . Pneumonia 02/27/2014   ED  . Sleep apnea    has cpap- does not wear     Past Surgical History:  Procedure Laterality Date  . BALLOON DILATION N/A 12/06/2017   Procedure: BALLOON DILATION;  Surgeon: Gatha Mayer, MD;  Location: [facility];  Service: Endoscopy;  Laterality: N/A;  . CHOLECYSTECTOMY  2011   [facility]  . COLONOSCOPY  07/26/11   Crohn's colitis, ileitis  . ESOPHAGOGASTRODUODENOSCOPY  05/04/11, 07/26/11   granulomatous gastritis - Crohn's  . ESOPHAGOGASTRODUODENOSCOPY (EGD) WITH PROPOFOL N/A 03/28/2017   Procedure: ESOPHAGOGASTRODUODENOSCOPY (EGD) WITH PROPOFOL  with balloon dilation of duodenum;  Surgeon: [name], MD;  Location: [facility];  Service: Gastroenterology;  Laterality: N/A;  . ESOPHAGOGASTRODUODENOSCOPY (EGD) WITH PROPOFOL N/A 12/06/2017   Procedure: ESOPHAGOGASTRODUODENOSCOPY (EGD) WITH PROPOFOL;  Surgeon: Gatha Mayer, MD;  Location: [facility];  Service: Endoscopy;  Laterality: N/A;  . FLEXIBLE SIGMOIDOSCOPY    . MULTIPLE TOOTH EXTRACTIONS  2005  . SHOULDER SURGERY     right  . UPPER GASTROINTESTINAL ENDOSCOPY    . VASECTOMY     bilateral w/lysis of penile adhesions    Current Outpatient Medications  Medication Sig Dispense  Refill  . azelastine (ASTELIN) 0.1 % nasal spray PLACE 2 SPRAYS INTO BOTH NOSTRILS 2 (TWO) TIMES DAILY. USE IN EACH NOSTRIL AS DIRECTED (Patient taking differently: Place 2 sprays into both nostrils 2 (two) times daily as needed for allergies. Use in each nostril as directed) 30 mL 1  . cetirizine (ZYRTEC) 10 MG tablet Take 1 tablet (10 mg total) by mouth daily as needed for allergies. 30 tablet 1  . cyclobenzaprine (FLEXERIL) 10 MG tablet Take 10 mg by mouth 3 (three) times daily as needed for muscle spasms.    Marland Kitchen doxazosin (CARDURA) 8 MG tablet Take 1 tablet (8 mg total) by mouth daily. 90 tablet 0  . fluticasone (FLONASE) 50 MCG/ACT  nasal spray Place 1 spray into both nostrils daily. 16 g 2  . hydrALAZINE (APRESOLINE) 50 MG tablet Take 1 tablet (50 mg total) by mouth every 8 (eight) hours. 90 tablet 0  . mercaptopurine (PURINETHOL) 50 MG tablet Take 1 tablet (50 mg total) by mouth daily. Give on an empty stomach 1 hour before or 2 hours after meals. 90 tablet 1  . olmesartan (BENICAR) 20 MG tablet Take 1 tablet (20 mg total) by mouth daily. 30 tablet 1  . omeprazole-sodium bicarbonate (ZEGERID) 40-1100 MG capsule TAKE 1 CAPSULE BY MOUTH AT BEDTIME. (Patient taking differently: Take 1 capsule by mouth daily. ) 90 capsule 0  . ondansetron (ZOFRAN-ODT) 4 MG disintegrating tablet TAKE 1 TABLET BY MOUTH EVERY 8 HOURS AS NEEDED FOR NAUSEA AND VOMITING (Patient taking differently: Take 4 mg by mouth every 8 (eight) hours as needed for nausea or vomiting. ) 30 tablet 5  . predniSONE (STERAPRED UNI-PAK 21 TAB) 10 MG (21) TBPK tablet Take by mouth daily. Take as directed. 21 tablet 0  . prochlorperazine (COMPAZINE) 25 MG suppository Place 1 suppository (25 mg total) rectally every 12 (twelve) hours as needed for nausea or vomiting. 12 suppository 2  . rizatriptan (MAXALT-MLT) 10 MG disintegrating tablet TAKE 1 TABLET (10 MG TOTAL) BY MOUTH 3 (THREE) TIMES DAILY AS NEEDED FOR MIGRAINE. 9 tablet 3  . ustekinumab (STELARA) 90 MG/ML SOSY injection Inject 1 mL (90 mg total) into the skin every 8 (eight) weeks. 1 Syringe 2  . verapamil (CALAN-SR) 180 MG CR tablet Take 2 tablets (360 mg total) by mouth daily. 180 tablet 1  . metoCLOPramide (REGLAN) 10 MG tablet Take 1 tablet by mouth at bedtime. 30 tablet 1   No current facility-administered medications for this visit.     Allergies as of 05/09/2018 - Review Complete 05/09/2018  Allergen Reaction Noted  . Lisinopril Swelling 04/24/2018  . Zithromax [azithromycin] Hypertension 04/14/2011    Family History  Problem Relation Age of Onset  . Colon polyps Father   . Diabetes Father   .  Hypertension Father   . Hypertension Mother   . Asthma Mother   . Heart disease Maternal Grandmother   . Colon cancer Neg Hx   . Rectal cancer Neg Hx   . Stomach cancer Neg Hx   . Esophageal cancer Neg Hx     Social History   Socioeconomic History  . Marital status: Married    Spouse name: Not on file  . Number of children: 3  . Years of education: Some college  . Highest education level: Not on file  Occupational History  . Occupation: [employer]  Social Needs  . Financial resource strain: Not on file  . Food insecurity:    Worry: Not on file  Inability: Not on file  . Transportation needs:    Medical: Not on file    Non-medical: Not on file  Tobacco Use  . Smoking status: Former Smoker    Packs/day: 0.75    Years: 21.00    Pack years: 15.75    Types: Cigarettes    Last attempt to quit: 12/01/2017    Years since quitting: 0.4  . Smokeless tobacco: Former Systems developer    Types: Snuff  . Tobacco comment: Counseling sheet given in exam room   Substance and Sexual Activity  . Alcohol use: Yes    Alcohol/week: 0.0 standard drinks    Comment: occ  . Drug use: No  . Sexual activity: Yes    Partners: Female  Lifestyle  . Physical activity:    Days per week: Not on file    Minutes per session: Not on file  . Stress: Not on file  Relationships  . Social connections:    Talks on phone: Not on file    Gets together: Not on file    Attends religious service: Not on file    Active member of club or organization: Not on file    Attends meetings of clubs or organizations: Not on file    Relationship status: Not on file  . Intimate partner violence:    Fear of current or ex partner: Not on file    Emotionally abused: Not on file    Physically abused: Not on file    Forced sexual activity: Not on file  Other Topics Concern  . Not on file  Social History Narrative   Married. Education: [employer].    Lives at home w/ his wife and kids   Right-handed   Caffeine: 3 sodas per day      Review of Systems:    Constitutional: No weight loss, fever or chills Cardiovascular: No chest pain Respiratory: No SOB  Gastrointestinal: See HPI and otherwise negative   Physical Exam:  Vital signs: BP (!) 166/76   Pulse (!) 112   Ht 6' (1.829 m)   Wt (!) 342 lb 6 oz (155.3 kg)   BMI 46.43 kg/m   Constitutional:   Pleasant  Morbidly obese AA male appears to be in NAD, Well developed, Well nourished, alert and cooperative Head:  Normocephalic and atraumatic. Eyes:   PEERL, EOMI. No icterus. Conjunctiva pink. Ears:  Normal auditory acuity. Neck:  Supple Throat: Oral cavity and pharynx without inflammation, swelling or lesion.  Respiratory: Respirations even and unlabored. Lungs clear to auscultation bilaterally.   No wheezes, crackles, or rhonchi.  Cardiovascular: Normal S1, S2. No MRG. Regular rate and rhythm. No peripheral edema, cyanosis or pallor.  Gastrointestinal:  Soft, nondistended, Mild RUQ ttp, No rebound or guarding. Normal bowel sounds. No appreciable masses or hepatomegaly. Rectal:  Not performed.  Psychiatric: Demonstrates good judgement and reason without abnormal affect or behaviors.  MOST RECENT LABS AND IMAGING: CBC    Component Value Date/Time   WBC 6.5 04/24/2018 1757   RBC 5.73 04/24/2018 1757   HGB 14.9 04/24/2018 1757   HCT 48.2 04/24/2018 1757   PLT 214 04/24/2018 1757   MCV 84.1 04/24/2018 1757   MCV 86.0 10/31/2017 1214   MCH 26.0 04/24/2018 1757   MCHC 30.9 04/24/2018 1757   RDW 14.2 04/24/2018 1757   LYMPHSABS 1.2 04/24/2018 1757   MONOABS 0.4 04/24/2018 1757   EOSABS 0.0 04/24/2018 1757   BASOSABS 0.0 04/24/2018 1757    CMP  Component Value Date/Time   NA 138 04/24/2018 1757   NA 144 04/19/2018 1730   K 3.9 04/24/2018 1757   CL 105 04/24/2018 1757   CO2 24 04/24/2018 1757   GLUCOSE 122 (H) 04/24/2018 1757   BUN 13 04/24/2018 1757   BUN 10 04/19/2018 1730   CREATININE 0.88 04/24/2018 1757   CREATININE 1.17 03/10/2016 1826    CALCIUM 8.7 (L) 04/24/2018 1757   PROT 7.9 03/25/2018 1349   PROT 7.1 02/08/2017 1643   ALBUMIN 3.9 03/25/2018 1349   ALBUMIN 4.1 02/08/2017 1643   AST 19 03/25/2018 1349   ALT 23 03/25/2018 1349   ALKPHOS 121 (H) 03/25/2018 1349   BILITOT 0.4 03/25/2018 1349   BILITOT 0.4 02/08/2017 1643   GFRNONAA >60 04/24/2018 1757   GFRNONAA 75 03/10/2016 1826   GFRAA >60 04/24/2018 1757   GFRAA 87 03/10/2016 1826    Assessment: 1.  Gastroduodenal Crohn's disease: Status post bypass gastrojejunostomy 02/06/2018 2.  Right upper quadrant pain: Felt to be possibly more musculoskeletal at time of exam today, if this continues though could consider some sort of abdominal imaging in the future 3.  GERD/choking: For the past 2 to 3 days with an acid taste in his mouth that wakes him up at night as well as a "choking" on his saliva, did just suffer from a angioedema from lisinopril a couple of weeks ago and is currently on Prednisone for this; consider reflux likely exacerbated by altered anatomy  Plan: 1.  Discussed case with [name] at time of patient's appointment as he has a complicated past medical history. 2.  Ordered LFTs, amylase and lipase 3.  Started the patient on Reglan 10 mg nightly #30 with 3 refills 4.  Patient to follow in clinic with [name] in December or as directed by labs above  Ellouise Newer, PA-C [facility] 05/09/2018, 3:09 PM  Cc: [name], MD

## 2018-05-13 NOTE — Progress Notes (Signed)
The mildly elevated alkaline phosphatase is of unclear significance - could be from bone, liver or intestine.   Given his constellation of problems I recommend we do an upper GI series  1) Reflux problems after gastrojejunostomy 2) Crohn's disease of upper GI tract

## 2018-05-15 ENCOUNTER — Other Ambulatory Visit: Payer: Self-pay

## 2018-05-15 ENCOUNTER — Emergency Department (HOSPITAL_COMMUNITY)
Admission: EM | Admit: 2018-05-15 | Discharge: 2018-05-15 | Disposition: A | Discharge status: Home / Self Care | Payer: Managed Care, Other (non HMO) | Attending: Emergency Medicine | Admitting: Emergency Medicine

## 2018-05-15 ENCOUNTER — Emergency Department (HOSPITAL_COMMUNITY): Payer: Managed Care, Other (non HMO)

## 2018-05-15 DIAGNOSIS — K5000 Crohn's disease of small intestine without complications: Secondary | ICD-10-CM

## 2018-05-15 DIAGNOSIS — I10 Essential (primary) hypertension: Secondary | ICD-10-CM | POA: Diagnosis not present

## 2018-05-15 DIAGNOSIS — Z87891 Personal history of nicotine dependence: Secondary | ICD-10-CM | POA: Diagnosis not present

## 2018-05-15 DIAGNOSIS — Z79899 Other long term (current) drug therapy: Secondary | ICD-10-CM | POA: Insufficient documentation

## 2018-05-15 DIAGNOSIS — R0789 Other chest pain: Secondary | ICD-10-CM | POA: Diagnosis not present

## 2018-05-15 LAB — COMPREHENSIVE METABOLIC PANEL
ALT: 23 U/L (ref 0–44)
AST: 23 U/L (ref 15–41)
Albumin: 2.9 g/dL — ABNORMAL LOW (ref 3.5–5.0)
Alkaline Phosphatase: 88 U/L (ref 38–126)
Anion gap: 4 — ABNORMAL LOW (ref 5–15)
BILIRUBIN TOTAL: 0.8 mg/dL (ref 0.3–1.2)
BUN: 15 mg/dL (ref 6–20)
CO2: 26 mmol/L (ref 22–32)
CREATININE: 0.93 mg/dL (ref 0.61–1.24)
Calcium: 8.1 mg/dL — ABNORMAL LOW (ref 8.9–10.3)
Chloride: 108 mmol/L (ref 98–111)
GFR calc non Af Amer: >60 mL/min (ref >60)
Glucose, Bld: 92 mg/dL (ref 70–99)
POTASSIUM: 3.7 mmol/L (ref 3.5–5.1)
Sodium: 138 mmol/L (ref 135–145)
TOTAL PROTEIN: 6.4 g/dL — AB (ref 6.5–8.1)

## 2018-05-15 LAB — CBC
HCT: 41.8 % (ref 39.0–52.0)
HEMOGLOBIN: 13.3 g/dL (ref 13.0–17.0)
MCH: 26.8 pg (ref 26.0–34.0)
MCHC: 31.8 g/dL (ref 30.0–36.0)
MCV: 84.3 fL (ref 80.0–100.0)
Platelets: 194 10*3/uL (ref 150–400)
RBC: 4.96 MIL/uL (ref 4.22–5.81)
RDW: 15 % (ref 11.5–15.5)
WBC: 7 10*3/uL (ref 4.0–10.5)
nRBC: 0 % (ref 0.0–0.2)

## 2018-05-15 LAB — I-STAT TROPONIN, ED
TROPONIN I, POC: 0 ng/mL (ref 0.00–0.08)
Troponin i, poc: 0.01 ng/mL (ref 0.00–0.08)

## 2018-05-15 MED ORDER — ACETAMINOPHEN 325 MG PO TABS
650.0000 mg | ORAL_TABLET | Freq: Once | ORAL | Status: AC
  Administered 2018-05-15: 650 mg via ORAL
  Filled 2018-05-15: qty 2

## 2018-05-15 MED ORDER — ASPIRIN 81 MG PO CHEW
324.0000 mg | CHEWABLE_TABLET | Freq: Once | ORAL | Status: AC
  Administered 2018-05-15: 324 mg via ORAL
  Filled 2018-05-15: qty 4

## 2018-05-15 NOTE — Progress Notes (Signed)
Yes - that was before his surgery so we need to see if there is anything different after his surgery

## 2018-05-15 NOTE — ED Provider Notes (Signed)
[facility] EMERGENCY DEPARTMENT Provider Note   CSN: [id number] Arrival date & time: 05/15/18  6295     History   Chief Complaint Chief Complaint  Patient presents with  . Chest Pain    HPI Mitsugi Schrader is a 47 y.o. male.  The history is provided by the patient.  Chest Pain   This is a new problem. The current episode started yesterday. The problem occurs rarely. The problem has not changed since onset.The pain is associated with raising an arm and movement. The pain is present in the lateral region. The pain is at a severity of 3/10. The pain is mild. The quality of the pain is described as dull. The pain does not radiate. Pertinent negatives include no abdominal pain, no back pain, no claudication, no cough, no diaphoresis, no exertional chest pressure, no fever, no irregular heartbeat, no leg pain, no lower extremity edema, no malaise/fatigue, no numbness, no orthopnea, no palpitations, no shortness of breath, no sputum production, no syncope and no vomiting. He has tried nothing for the symptoms. The treatment provided no relief.  His past medical history is significant for hypertension.  Pertinent negatives for past medical history include no PE and no seizures.  Pertinent negatives for family medical history include: no CAD.    Past Medical History:  Diagnosis Date  . Allergy    trees/ grasses/ animals/dust/mold  . Anxiety   . Biliary dyskinesia   . Colloid thyroid nodule   . Crohn's disease ([place])    Stomach, terminal ileum, cecum  . Gastric ulcer    antral  . GERD (gastroesophageal reflux disease)   . History of migraine headaches   . HTN (hypertension)   . Kidney stone 09/20/2012  . Migraine   . Obesity   . Pneumonia 02/27/2014   ED  . Sleep apnea    has cpap- does not wear     Patient Active Problem List   Diagnosis Date Noted  . Angioedema 04/19/2018  . Crohn's disease of small intestine with other complication ([place]) 28/41/3244  .  Duodenal stricture   . Intractable vomiting with nausea   . Cigarette nicotine dependence without complication 07/05/7251  . Shifting sleep-work schedule, affecting sleep 05/15/2016  . Sleep deprivation 05/15/2016  . Sleep related hypoventilation in conditions classified elsewhere 05/15/2016  . Sleep related headaches 05/15/2016  . Morbid obesity ([place]) 09/19/2013  . Diarrhea 07/10/2013  . 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 Hypertension 11/26/2012  . Gastroparesis??? 12/08/2011  . Common migraine without aura 12/08/2011  . Long-term use of immunosuppressant medication 08/08/2011  . Gastroduodenal Crohn's disease with intestinal obstruction ([place]) 05/17/2011  . GERD (gastroesophageal reflux disease) 04/21/2011    Past Surgical History:  Procedure Laterality Date  . BALLOON DILATION N/A 12/06/2017   Procedure: BALLOON DILATION;  Surgeon: Gatha Mayer, MD;  Location: [facility];  Service: Endoscopy;  Laterality: N/A;  . CHOLECYSTECTOMY  2011   [facility]  . COLONOSCOPY  07/26/11   Crohn's colitis, ileitis  . ESOPHAGOGASTRODUODENOSCOPY  05/04/11, 07/26/11   granulomatous gastritis - Crohn's  . ESOPHAGOGASTRODUODENOSCOPY (EGD) WITH PROPOFOL N/A 03/28/2017   Procedure: ESOPHAGOGASTRODUODENOSCOPY (EGD) WITH PROPOFOL  with balloon dilation of duodenum;  Surgeon: [name], MD;  Location: [facility];  Service: Gastroenterology;  Laterality: N/A;  . ESOPHAGOGASTRODUODENOSCOPY (EGD) WITH PROPOFOL N/A 12/06/2017   Procedure: ESOPHAGOGASTRODUODENOSCOPY (EGD) WITH PROPOFOL;  Surgeon: Gatha Mayer, MD;  Location: [facility];  Service: Endoscopy;  Laterality: N/A;  . FLEXIBLE SIGMOIDOSCOPY    .  MULTIPLE TOOTH EXTRACTIONS  2005  . SHOULDER SURGERY     right  . UPPER GASTROINTESTINAL ENDOSCOPY    . VASECTOMY     bilateral w/lysis of penile adhesions        Home Medications    Prior to Admission medications   Medication Sig Start Date End Date Taking? Authorizing Provider    azelastine (ASTELIN) 0.1 % nasal spray PLACE 2 SPRAYS INTO BOTH NOSTRILS 2 (TWO) TIMES DAILY. USE IN EACH NOSTRIL AS DIRECTED Patient taking differently: Place 2 sprays into both nostrils 2 (two) times daily as needed for allergies. Use in each nostril as directed 01/04/18   [name], MD  cetirizine (ZYRTEC) 10 MG tablet Take 1 tablet (10 mg total) by mouth daily as needed for allergies. 05/01/18   Loura Halt A, NP  cyclobenzaprine (FLEXERIL) 10 MG tablet Take 10 mg by mouth 3 (three) times daily as needed for muscle spasms.    [provider]  doxazosin (CARDURA) 8 MG tablet Take 1 tablet (8 mg total) by mouth daily. 03/01/18   [name], MD  fluticasone (FLONASE) 50 MCG/ACT nasal spray Place 1 spray into both nostrils daily. 05/01/18   Loura Halt A, NP  hydrALAZINE (APRESOLINE) 50 MG tablet Take 1 tablet (50 mg total) by mouth every 8 (eight) hours. 04/21/18 05/21/18  Shelly Coss, MD  mercaptopurine (PURINETHOL) 50 MG tablet Take 1 tablet (50 mg total) by mouth daily. Give on an empty stomach 1 hour before or 2 hours after meals. 05/02/17   Gatha Mayer, MD  metoCLOPramide (REGLAN) 10 MG tablet Take 1 tablet by mouth at bedtime. 05/09/18   Levin Erp, PA  olmesartan (BENICAR) 20 MG tablet Take 1 tablet (20 mg total) by mouth daily. 04/30/18   [name], MD  omeprazole-sodium bicarbonate (ZEGERID) 40-1100 MG capsule TAKE 1 CAPSULE BY MOUTH AT BEDTIME. Patient taking differently: Take 1 capsule by mouth daily.  04/25/17   [name], MD  ondansetron (ZOFRAN-ODT) 4 MG disintegrating tablet TAKE 1 TABLET BY MOUTH EVERY 8 HOURS AS NEEDED FOR NAUSEA AND VOMITING Patient taking differently: Take 4 mg by mouth every 8 (eight) hours as needed for nausea or vomiting.  01/15/18   Gatha Mayer, MD  predniSONE (STERAPRED UNI-PAK 21 TAB) 10 MG (21) TBPK tablet Take by mouth daily. Take as directed. 05/08/18   Vanessa Kick, MD  prochlorperazine  (COMPAZINE) 25 MG suppository Place 1 suppository (25 mg total) rectally every 12 (twelve) hours as needed for nausea or vomiting. 03/19/17   Gatha Mayer, MD  rizatriptan (MAXALT-MLT) 10 MG disintegrating tablet TAKE 1 TABLET (10 MG TOTAL) BY MOUTH 3 (THREE) TIMES DAILY AS NEEDED FOR MIGRAINE. 05/09/17   Kathrynn Ducking, MD  ustekinumab (STELARA) 90 MG/ML SOSY injection Inject 1 mL (90 mg total) into the skin every 8 (eight) weeks. 07/25/17   Gatha Mayer, MD  verapamil (CALAN-SR) 180 MG CR tablet Take 2 tablets (360 mg total) by mouth daily. 11/08/17   [name], MD    Family History Family History  Problem Relation Age of Onset  . Colon polyps Father   . Diabetes Father   . Hypertension Father   . Hypertension Mother   . Asthma Mother   . Heart disease Maternal Grandmother   . Colon cancer Neg Hx   . Rectal cancer Neg Hx   . Stomach cancer Neg Hx   . Esophageal cancer Neg Hx  Social History Social History   Tobacco Use  . Smoking status: Former Smoker    Packs/day: 0.75    Years: 21.00    Pack years: 15.75    Types: Cigarettes    Last attempt to quit: 12/01/2017    Years since quitting: 0.4  . Smokeless tobacco: Former Systems developer    Types: Snuff  . Tobacco comment: Counseling sheet given in exam room   Substance Use Topics  . Alcohol use: Yes    Alcohol/week: 0.0 standard drinks    Comment: occ  . Drug use: No     Allergies   Lisinopril and Zithromax [azithromycin]   Review of Systems Review of Systems  Constitutional: Negative for chills, diaphoresis, fever and malaise/fatigue.  HENT: Positive for congestion, ear pain and sinus pain. Negative for sore throat.   Eyes: Negative for pain and visual disturbance.  Respiratory: Negative for cough, sputum production and shortness of breath.   Cardiovascular: Positive for chest pain. Negative for palpitations, orthopnea, claudication and syncope.  Gastrointestinal: Negative for abdominal pain and vomiting.    Genitourinary: Negative for dysuria and hematuria.  Musculoskeletal: Positive for myalgias. Negative for arthralgias and back pain.  Skin: Negative for color change and rash.  Neurological: Negative for seizures, syncope and numbness.  All other systems reviewed and are negative.    Physical Exam Updated Vital Signs  ED Triage Vitals  Enc Vitals Group     BP 05/15/18 0702 (!) 175/101     Pulse Rate 05/15/18 0702 81     Resp 05/15/18 0702 20     Temp 05/15/18 0702 98.8 F (37.1 C)     Temp Source 05/15/18 0702 Oral     SpO2 05/15/18 0702 98 %     Weight 05/15/18 0700 (!) 340 lb (154.2 kg)     Height 05/15/18 0700 6' (1.829 m)     Head Circumference --      Peak Flow --      Pain Score 05/15/18 0700 7     Pain Loc --      Pain Edu? --      Excl. in [place]? --     Physical Exam  Constitutional: He appears well-developed and well-nourished.  HENT:  Head: Normocephalic and atraumatic.  Eyes: Pupils are equal, round, and reactive to light. Conjunctivae and EOM are normal.  Neck: Normal range of motion. Neck supple.  Cardiovascular: Normal rate, regular rhythm, intact distal pulses and normal pulses.  No murmur heard. Pulmonary/Chest: Effort normal and breath sounds normal. No respiratory distress. He has no decreased breath sounds. He has no wheezes. He has no rhonchi. He has no rales.  Abdominal: Soft. There is no tenderness.  Musculoskeletal: Normal range of motion. He exhibits no edema.       Right lower leg: He exhibits no edema.       Left lower leg: He exhibits no edema.  TTP to left side of chest wall  Neurological: He is alert.  Skin: Skin is warm and dry. Capillary refill takes less than 2 seconds.  Psychiatric: He has a normal mood and affect.  Nursing note and vitals reviewed.    ED Treatments / Results  Labs (all labs ordered are listed, but only abnormal results are displayed) Labs Reviewed  CBC  COMPREHENSIVE METABOLIC PANEL  I-STAT TROPONIN, ED     EKG EKG Interpretation  Date/Time:  Wednesday May 15 2018 07:02:50 EST Ventricular Rate:  79 PR Interval:    QRS Duration: 90  QT Interval:  386 QTC Calculation: 443 R Axis:   63 Text Interpretation:  Sinus rhythm Nonspecific T abnormalities, lateral leads Baseline wander in lead(s) V3 Confirmed by Lennice Sites [phone]) on 05/15/2018 7:09:36 AM   Radiology No results found.  Procedures Procedures (including critical care time)  Medications Ordered in ED Medications  aspirin chewable tablet 324 mg (has no administration in time range)  acetaminophen (TYLENOL) tablet 650 mg (has no administration in time range)     Initial Impression / Assessment and Plan / ED Course  I have reviewed the triage vital signs and the nursing notes.  Pertinent labs & imaging results that were available during my care of the patient were reviewed by me and considered in my medical decision making (see chart for details).     Gordie Crumby is a 47 year old male with history of anxiety, Crohn's disease who presents to the ED with left-sided chest pain.  Patient with normal vitals except for mild hypertension.  Patient with viral symptoms the last few days with left ear pain.  Has some left-sided chest wall pain that is reproducible.  Pain when he lifts up his left arm.  Denies any cough, sputum production.  Denies any cardiac history.  Patient is PERC negative and doubt PE.  Patient denies any abdominal pain, shortness of breath.  Overall is well-appearing.  No signs of ear or throat infection on exam.  Clear breath sounds on exam.  No signs of volume overload.  EKG shows sinus rhythm no signs of ischemic changes.  Troponin within normal limits x2.  No significant anemia, electrolyte abnormality, kidney injury.  Patient given Tylenol and aspirin and had some improvement.  Chest x-ray was done that showed no signs of pneumonia, pneumothorax, pleural effusion.  Suspect viral process, likely  musculoskeletal pain.  Recommend follow-up with primary care doctor.  No concern for ACS.  Given return precautions and discharged from ED in good condition.  This chart was dictated using voice recognition software.  Despite best efforts to proofread,  errors can occur which can change the documentation meaning.   Final Clinical Impressions(s) / ED Diagnoses   Final diagnoses:  Chest wall pain    ED Discharge Orders    None       Lennice Sites, DO 05/15/18 1007

## 2018-05-15 NOTE — ED Triage Notes (Signed)
Patient /o CP that started today; pain radiates to neck, temple and left shoulder. Denies SOB.

## 2018-05-16 ENCOUNTER — Other Ambulatory Visit: Payer: Self-pay | Admitting: Internal Medicine

## 2018-05-16 ENCOUNTER — Telehealth: Payer: Self-pay | Admitting: Internal Medicine

## 2018-05-16 ENCOUNTER — Ambulatory Visit (HOSPITAL_COMMUNITY)
Admission: RE | Admit: 2018-05-16 | Discharge: 2018-05-16 | Disposition: A | Discharge status: Home / Self Care | Payer: Managed Care, Other (non HMO) | Source: Ambulatory Visit | Attending: Internal Medicine | Admitting: Internal Medicine

## 2018-05-16 DIAGNOSIS — K5000 Crohn's disease of small intestine without complications: Secondary | ICD-10-CM

## 2018-05-16 MED ORDER — METOCLOPRAMIDE HCL 10 MG PO TABS: 10.0000 mg | ORAL_TABLET | Freq: Three times a day (TID) | ORAL | 1 refills | Status: DC

## 2018-05-16 NOTE — Telephone Encounter (Signed)
Pt's wife calling, she would like to speak with you about the possibility for pt to go on long term disability. Pls call her.

## 2018-05-16 NOTE — Progress Notes (Signed)
My Chart note

## 2018-05-16 NOTE — Telephone Encounter (Signed)
Please review and advise from previous message;

## 2018-05-17 NOTE — Telephone Encounter (Signed)
I have communicated by My Chart and will call them

## 2018-05-21 ENCOUNTER — Encounter: Payer: Self-pay | Admitting: Family Medicine

## 2018-05-21 ENCOUNTER — Ambulatory Visit (INDEPENDENT_AMBULATORY_CARE_PROVIDER_SITE_OTHER): Payer: Managed Care, Other (non HMO)

## 2018-05-21 ENCOUNTER — Ambulatory Visit (INDEPENDENT_AMBULATORY_CARE_PROVIDER_SITE_OTHER): Payer: Managed Care, Other (non HMO) | Admitting: Family Medicine

## 2018-05-21 ENCOUNTER — Other Ambulatory Visit: Payer: Self-pay

## 2018-05-21 VITALS — BP 130/82 | HR 88 | Temp 98.0°F | Ht 72.0 in | Wt 350.8 lb

## 2018-05-21 DIAGNOSIS — I10 Essential (primary) hypertension: Secondary | ICD-10-CM | POA: Diagnosis not present

## 2018-05-21 DIAGNOSIS — R0989 Other specified symptoms and signs involving the circulatory and respiratory systems: Secondary | ICD-10-CM

## 2018-05-21 DIAGNOSIS — R109 Unspecified abdominal pain: Secondary | ICD-10-CM | POA: Diagnosis not present

## 2018-05-21 DIAGNOSIS — R0789 Other chest pain: Secondary | ICD-10-CM | POA: Diagnosis not present

## 2018-05-21 DIAGNOSIS — R0600 Dyspnea, unspecified: Secondary | ICD-10-CM

## 2018-05-21 DIAGNOSIS — Z87898 Personal history of other specified conditions: Secondary | ICD-10-CM | POA: Diagnosis not present

## 2018-05-21 DIAGNOSIS — R1011 Right upper quadrant pain: Secondary | ICD-10-CM | POA: Diagnosis not present

## 2018-05-21 DIAGNOSIS — R079 Chest pain, unspecified: Secondary | ICD-10-CM

## 2018-05-21 DIAGNOSIS — N200 Calculus of kidney: Secondary | ICD-10-CM

## 2018-05-21 LAB — POCT CBC
GRANULOCYTE PERCENT: 48.9 % (ref 37–80)
HCT, POC: 43 % — AB (ref 29–41)
HEMOGLOBIN: 14 g/dL — AB (ref 9.5–13.5)
Lymph, poc: 2.1 (ref 0.6–3.4)
MCH, POC: 27.2 pg (ref 27–31.2)
MCHC: 32.6 g/dL (ref 31.8–35.4)
MCV: 83.4 fL (ref 76–111)
MID (CBC): 0.7 (ref 0–0.9)
MPV: 7.7 fL (ref 0–99.8)
PLATELET COUNT, POC: 244 10*3/uL (ref 142–424)
POC Granulocyte: 2.6 (ref 2–6.9)
POC LYMPH PERCENT: 38.9 %L (ref 10–50)
POC MID %: 12.2 %M — AB (ref 0–12)
RBC: 5.15 M/uL (ref 4.69–6.13)
RDW, POC: 15.4 %
WBC: 5.4 10*3/uL (ref 4.6–10.2)

## 2018-05-21 LAB — POCT URINALYSIS DIP (MANUAL ENTRY)
Bilirubin, UA: NEGATIVE
GLUCOSE UA: NEGATIVE mg/dL
Ketones, POC UA: NEGATIVE mg/dL
Leukocytes, UA: NEGATIVE
NITRITE UA: NEGATIVE
PH UA: 6.5 (ref 5.0–8.0)
PROTEIN UA: NEGATIVE mg/dL
RBC UA: NEGATIVE
SPEC GRAV UA: 1.02 (ref 1.010–1.025)
UROBILINOGEN UA: 0.2 U/dL

## 2018-05-21 LAB — POC MICROSCOPIC URINALYSIS (UMFC): Mucus: ABSENT

## 2018-05-21 MED ORDER — HYDRALAZINE HCL 50 MG PO TABS: 50.0000 mg | ORAL_TABLET | Freq: Three times a day (TID) | ORAL | 0 refills | Status: DC

## 2018-05-21 MED ORDER — OLMESARTAN MEDOXOMIL 20 MG PO TABS: 20.0000 mg | ORAL_TABLET | Freq: Every day | ORAL | 1 refills | Status: DC

## 2018-05-21 NOTE — Patient Instructions (Addendum)
Blood pressure looks ok on current dose of meds. Prednisone can cause some elevations. I will refer you to ENT to evaluate the choking sensation.  If any swelling sensation in back of throat or tongue go to the emergency room or call 911 if needed.   Continue follow up with [name] regarding the vomiting symptoms.   I will check blood clot screening test, but if elevated may need CT scan.   Please call urology for follow up. I will check CT to see if any further stones that may be causing your pain.   If there are any concerns on your rib xray I will let you know.   Return to the clinic or go to the nearest emergency room if any of your symptoms worsen or new symptoms occur.   Abdominal Pain, Adult Abdominal pain can be caused by many things. Often, abdominal pain is not serious and it gets better with no treatment or by being treated at home. However, sometimes abdominal pain is serious. Your health care provider will do a medical history and a physical exam to try to determine the cause of your abdominal pain. Follow these instructions at home:  Take over-the-counter and prescription medicines only as told by your health care provider. Do not take a laxative unless told by your health care provider.  Drink enough fluid to keep your urine clear or pale yellow.  Watch your condition for any changes.  Keep all follow-up visits as told by your health care provider. This is important. Contact a health care provider if:  Your abdominal pain changes or gets worse.  You are not hungry or you lose weight without trying.  You are constipated or have diarrhea for more than 2-3 days.  You have pain when you urinate or have a bowel movement.  Your abdominal pain wakes you up at night.  Your pain gets worse with meals, after eating, or with certain foods.  You are throwing up and cannot keep anything down.  You have a fever. Get help right away if:  Your pain does not go away as  soon as your health care provider told you to expect.  You cannot stop throwing up.  Your pain is only in areas of the abdomen, such as the right side or the left lower portion of the abdomen.  You have bloody or black stools, or stools that look like tar.  You have severe pain, cramping, or bloating in your abdomen.  You have signs of dehydration, such as: ? Dark urine, very little urine, or no urine. ? Cracked lips. ? Dry mouth. ? Sunken eyes. ? Sleepiness. ? Weakness. This information is not intended to replace advice given to you by your health care provider. Make sure you discuss any questions you have with your health care provider. Document Released: 03/29/2005 Document Revised: 01/07/2016 Document Reviewed: 12/01/2015 Elsevier Interactive Patient Education  2018 [place].  Chest Wall Pain Chest wall pain is pain in or around the bones and muscles of your chest. Sometimes, an injury causes this pain. Sometimes, the cause may not be known. This pain may take several weeks or longer to get better. Follow these instructions at home: Pay attention to any changes in your symptoms. Take these actions to help with your pain:  Rest as told by your health care provider.  Avoid activities that cause pain. These include any activities that use your chest muscles or your abdominal and side muscles to lift heavy items.  If directed, apply ice to the painful area: ? Put ice in a plastic bag. ? Place a towel between your skin and the bag. ? Leave the ice on for 20 minutes, 2-3 times per day.  Take over-the-counter and prescription medicines only as told by your health care provider.  Do not use tobacco products, including cigarettes, chewing tobacco, and e-cigarettes. If you need help quitting, ask your health care provider.  Keep all follow-up visits as told by your health care provider. This is important.  Contact a health care provider if:  You have a fever.  Your chest  pain becomes worse.  You have new symptoms. Get help right away if:  You have nausea or vomiting.  You feel sweaty or light-headed.  You have a cough with phlegm (sputum) or you cough up blood.  You develop shortness of breath. This information is not intended to replace advice given to you by your health care provider. Make sure you discuss any questions you have with your health care provider. Document Released: 06/19/2005 Document Revised: 10/28/2015 Document Reviewed: 09/14/2014 Elsevier Interactive Patient Education  [employer].   If you have lab work done today you will be contacted with your lab results within the next 2 weeks.  If you have not heard from [place] then please contact us. The fastest way to get your results is to register for My Chart.   IF you received an x-ray today, you will receive an invoice from [facility]. Please contact [facility] at [phone] with questions or concerns regarding your invoice.   IF you received labwork today, you will receive an invoice from [facility]. Please contact [facility] at [phone] with questions or concerns regarding your invoice.   Our billing staff will not be able to assist you with questions regarding bills from these companies.  You will be contacted with the lab results as soon as they are available. The fastest way to get your results is to activate your My Chart account. Instructions are located on the last page of this paperwork. If you have not heard from [place] regarding the results in 2 weeks, please contact this office.

## 2018-05-21 NOTE — Progress Notes (Signed)
Subjective:  By signing my name below, I, Moises Blood, attest that this documentation has been prepared under the direction and in the presence of Merri Ray, MD. Electronically Signed: Moises Blood, [place]. 05/21/2018 , 5:36 PM .  Patient was seen in Room 10 .   Patient ID: Craig Guzman, male    DOB: [birth year], 47 y.o.   MRN: [id number] Chief Complaint  Patient presents with  . Hospitalization Follow-up    chest pain & pain on left side   HPI Craig Guzman is a 47 y.o. male Here for ER and hospital follow up. Patient was last seen on Oct 18th.   Over 40 minute time with chart review.   Oct 18th He was hospitalized for angioedema from Oct 18th through Oct 20th. Thought to be secondary to ACE inhibitor so lisinopril stopped, and started on hydralazine. Started H2 blockers and improved.   Oct 23rd He was seen again in the ER on Oct 23rd with headache and bulging bilateral TM's. He was diagnosed with otitis media of both ears. He was treated with Toradol, Compazine, Benadryl and IV fluids with headache resolved. He denied mastoid tenderness, and treated with Augmentin. He was recommended to follow up for BP 176/85 with IV fluids in 2-3 days.   Oct 25th He was seen by Dr. Pamella Pert on Oct 25th with BP 144/92. Recent prednisone use thought to be causing palpitations. BP slightly elevated, but no changes so thought to be related to prednisone, recheck in 1-2 weeks. Telephone note Oct 28th, triamterene HCTZ stopped due to possible pancreatitis and HCTZ use. He was started on olmesartan 20 mg qd with ER precautions for angioedema.   Oct 30th He went to [facility] urgent care on Oct 30th with persistent left ear pain. His right ear pain resolved. He was continued on Flonase and Zyrtec daily, and continue to use Astelin nasal spray. ENT follow up if not improved.   Nov 6th He was seen in the ER on Nov 6th with globus pharyngeus. He was treated with prednisone taper again with Sterapred  uni-pak.   Nov 13th He was seen again on Nov 13th with left sided chest wall pain, reproducible chest wall pain with raising left arm. PERC negative, troponin normal x2, EKG without ischemic changes. Chest xray without adenopathy, pneumothorax or pleural effusion, likely MSK pain. Recommended primary care follow up. BP 175/101 in the ER.   TODAY  HTN Patient states his BP has been doing well, ranging between 125-140s/75-87. When he was on prednisone, his BP had shot up.   Choking and vomiting Patient mentions he's still choking in his sleep with vomiting. He mentions sometimes difficulty with swallowing. This has been present since his surgery. He denies worsening since prednisone. He denies swelling in his mouth or swelling of his tongue. His choking has worsened in the past 2 weeks. During the day, he would sometimes choke on his own spit with it going down the wrong pipe. However, he notes being able to eat and drink without difficulty. He has spoken with GI, [name], regarding this. He had an upper GI study done, which showed gastric fold thickening. [name] informed that this choking may be caused by his sphincter muscle being weak, causing the choking sensation.   Right flank pain Patient also complains of worsening right sided flank pain. He states it started as an episodic chest wall pain, with intermittent right flank pains, but now the right flank pains have been more constant. He  mentions sporadic shortness of breath. He denies leg swelling. He denies history of blood clots in his legs or his lungs.   Nov 13th, portable chest xray 1 view without edema or consolidation.  Oct 25th, no focal airspace consolidation.   He has history of gall bladder removed. He still has his appendix. He denies hematuria. He also mentions passing a kidney stone at home about 2 weeks ago, on the right side. He's passed 6 kidney stones in the past year. He was followed by urologist, Dr. Risa Grill, but  hasn't been seen in a long time.   Patient Active Problem List   Diagnosis Date Noted  . Angioedema 04/19/2018  . Crohn's disease of small intestine with other complication ([place]) 07/11/3233  . Duodenal stricture   . Intractable vomiting with nausea   . Cigarette nicotine dependence without complication 57/32/2025  . Shifting sleep-work schedule, affecting sleep 05/15/2016  . Sleep deprivation 05/15/2016  . Sleep related hypoventilation in conditions classified elsewhere 05/15/2016  . Sleep related headaches 05/15/2016  . Morbid obesity ([place]) 09/19/2013  . Diarrhea 07/10/2013  . 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 Hypertension 11/26/2012  . Gastroparesis??? 12/08/2011  . Common migraine without aura 12/08/2011  . Long-term use of immunosuppressant medication 08/08/2011  . Gastroduodenal Crohn's disease with intestinal obstruction ([place]) 05/17/2011  . GERD (gastroesophageal reflux disease) 04/21/2011   Past Medical History:  Diagnosis Date  . Allergy    trees/ grasses/ animals/dust/mold  . Anxiety   . Biliary dyskinesia   . Colloid thyroid nodule   . Crohn's disease ([place])    Stomach, terminal ileum, cecum  . Gastric ulcer    antral  . GERD (gastroesophageal reflux disease)   . History of migraine headaches   . HTN (hypertension)   . Kidney stone 09/20/2012  . Migraine   . Obesity   . Pneumonia 02/27/2014   ED  . Sleep apnea    has cpap- does not wear    Past Surgical History:  Procedure Laterality Date  . BALLOON DILATION N/A 12/06/2017   Procedure: BALLOON DILATION;  Surgeon: Gatha Mayer, MD;  Location: [facility];  Service: Endoscopy;  Laterality: N/A;  . CHOLECYSTECTOMY  2011   [place]  . COLONOSCOPY  07/26/11   Crohn's colitis, ileitis  . ESOPHAGOGASTRODUODENOSCOPY  05/04/11, 07/26/11   granulomatous gastritis - Crohn's  . ESOPHAGOGASTRODUODENOSCOPY (EGD) WITH PROPOFOL N/A 03/28/2017   Procedure: ESOPHAGOGASTRODUODENOSCOPY (EGD) WITH PROPOFOL  with balloon  dilation of duodenum;  Surgeon: [name], MD;  Location: [facility];  Service: Gastroenterology;  Laterality: N/A;  . ESOPHAGOGASTRODUODENOSCOPY (EGD) WITH PROPOFOL N/A 12/06/2017   Procedure: ESOPHAGOGASTRODUODENOSCOPY (EGD) WITH PROPOFOL;  Surgeon: Gatha Mayer, MD;  Location: [facility];  Service: Endoscopy;  Laterality: N/A;  . FLEXIBLE SIGMOIDOSCOPY    . MULTIPLE TOOTH EXTRACTIONS  2005  . SHOULDER SURGERY     right  . UPPER GASTROINTESTINAL ENDOSCOPY    . VASECTOMY     bilateral w/lysis of penile adhesions   Allergies  Allergen Reactions  . Lisinopril Swelling  . Zithromax [Azithromycin] Hypertension   Prior to Admission medications   Medication Sig Start Date End Date Taking? Authorizing Provider  azelastine (ASTELIN) 0.1 % nasal spray PLACE 2 SPRAYS INTO BOTH NOSTRILS 2 (TWO) TIMES DAILY. USE IN EACH NOSTRIL AS DIRECTED Patient taking differently: Place 2 sprays into both nostrils 2 (two) times daily as needed for allergies. Use in each nostril as directed 01/04/18   [name], MD  cetirizine Alethia Berthold)  10 MG tablet Take 1 tablet (10 mg total) by mouth daily as needed for allergies. 05/01/18   Loura Halt A, NP  cyclobenzaprine (FLEXERIL) 10 MG tablet Take 10 mg by mouth 3 (three) times daily as needed for muscle spasms.    [provider]  doxazosin (CARDURA) 8 MG tablet Take 1 tablet (8 mg total) by mouth daily. 03/01/18   [name], MD  fluticasone (FLONASE) 50 MCG/ACT nasal spray Place 1 spray into both nostrils daily. 05/01/18   Loura Halt A, NP  hydrALAZINE (APRESOLINE) 50 MG tablet Take 1 tablet (50 mg total) by mouth every 8 (eight) hours. 04/21/18 05/21/18  Shelly Coss, MD  mercaptopurine (PURINETHOL) 50 MG tablet Take 1 tablet (50 mg total) by mouth daily. Give on an empty stomach 1 hour before or 2 hours after meals. 05/02/17   Gatha Mayer, MD  metoCLOPramide (REGLAN) 10 MG tablet Take 1 tablet (10 mg total) by mouth 4 (four)  times daily -  before meals and at bedtime. 05/16/18   Gatha Mayer, MD  olmesartan (BENICAR) 20 MG tablet Take 1 tablet (20 mg total) by mouth daily. 04/30/18   [name], MD  omeprazole-sodium bicarbonate (ZEGERID) 40-1100 MG capsule TAKE 1 CAPSULE BY MOUTH AT BEDTIME. Patient taking differently: Take 1 capsule by mouth daily.  04/25/17   [name], MD  ondansetron (ZOFRAN-ODT) 4 MG disintegrating tablet TAKE 1 TABLET BY MOUTH EVERY 8 HOURS AS NEEDED FOR NAUSEA AND VOMITING Patient taking differently: Take 4 mg by mouth every 8 (eight) hours as needed for nausea or vomiting.  01/15/18   Gatha Mayer, MD  predniSONE (STERAPRED UNI-PAK 21 TAB) 10 MG (21) TBPK tablet Take by mouth daily. Take as directed. 05/08/18   Vanessa Kick, MD  prochlorperazine (COMPAZINE) 25 MG suppository Place 1 suppository (25 mg total) rectally every 12 (twelve) hours as needed for nausea or vomiting. 03/19/17   Gatha Mayer, MD  rizatriptan (MAXALT-MLT) 10 MG disintegrating tablet TAKE 1 TABLET (10 MG TOTAL) BY MOUTH 3 (THREE) TIMES DAILY AS NEEDED FOR MIGRAINE. 05/09/17   Kathrynn Ducking, MD  ustekinumab (STELARA) 90 MG/ML SOSY injection Inject 1 mL (90 mg total) into the skin every 8 (eight) weeks. 07/25/17   Gatha Mayer, MD  verapamil (CALAN-SR) 180 MG CR tablet Take 2 tablets (360 mg total) by mouth daily. 11/08/17   [name], MD   Social History   Socioeconomic History  . Marital status: Married    Spouse name: Not on file  . Number of children: 3  . Years of education: Some college  . Highest education level: Not on file  Occupational History  . Occupation: [employer]  Social Needs  . Financial resource strain: Not on file  . Food insecurity:    Worry: Not on file    Inability: Not on file  . Transportation needs:    Medical: Not on file    Non-medical: Not on file  Tobacco Use  . Smoking status: Former Smoker    Packs/day: 0.75    Years: 21.00    Pack years: 15.75     Types: Cigarettes    Last attempt to quit: 12/01/2017    Years since quitting: 0.4  . Smokeless tobacco: Former Systems developer    Types: Snuff  . Tobacco comment: Counseling sheet given in exam room   Substance and Sexual Activity  . Alcohol use: Yes    Alcohol/week: 0.0 standard drinks  Comment: occ  . Drug use: No  . Sexual activity: Yes    Partners: Female  Lifestyle  . Physical activity:    Days per week: Not on file    Minutes per session: Not on file  . Stress: Not on file  Relationships  . Social connections:    Talks on phone: Not on file    Gets together: Not on file    Attends religious service: Not on file    Active member of club or organization: Not on file    Attends meetings of clubs or organizations: Not on file    Relationship status: Not on file  . Intimate partner violence:    Fear of current or ex partner: Not on file    Emotionally abused: Not on file    Physically abused: Not on file    Forced sexual activity: Not on file  Other Topics Concern  . Not on file  Social History Narrative   Married. Education: [employer].    Lives at home w/ his wife and kids   Right-handed   Caffeine: 3 sodas per day   Review of Systems  Constitutional: Negative for fatigue and unexpected weight change.  HENT: Positive for trouble swallowing.   Eyes: Negative for visual disturbance.  Respiratory: Negative for cough, chest tightness and shortness of breath.   Cardiovascular: Negative for chest pain, palpitations and leg swelling.  Gastrointestinal: Negative for abdominal pain and blood in stool.  Genitourinary: Positive for flank pain. Negative for hematuria.  Neurological: Negative for dizziness, light-headedness and headaches.       Objective:   Physical Exam  Constitutional: He is oriented to person, place, and time. He appears well-developed and well-nourished.  HENT:  Head: Normocephalic and atraumatic.  Right Ear: Tympanic membrane and ear canal normal.  Left Ear:  Tympanic membrane and ear canal normal.  Do not see any swelling of tongue or post oropharynx, able to speak completely   Eyes: Pupils are equal, round, and reactive to light. EOM are normal.  Neck: No JVD present. Carotid bruit is not present.  Cardiovascular: Normal rate, regular rhythm and normal heart sounds.  No murmur heard. Pulmonary/Chest: Effort normal and breath sounds normal. No stridor. He has no wheezes. He has no rales.  Abdominal: There is tenderness in the right upper quadrant. There is negative Murphy's sign.  Slight right sided flank tenderness, skin intact, no rash or erythema  Musculoskeletal: He exhibits no edema.  Neurological: He is alert and oriented to person, place, and time.  Skin: Skin is warm and dry.  Psychiatric: He has a normal mood and affect.  Vitals reviewed.   Vitals:   05/21/18 1651  BP: 130/82  Pulse: 88  Temp: 98 F (36.7 C)  TempSrc: Oral  SpO2: 97%  Weight: (!) 350 lb 12.8 oz (159.1 kg)  Height: 6' (1.829 m)   Results for orders placed or performed in visit on 05/21/18  POCT CBC  Result Value Ref Range   WBC 5.4 4.6 - 10.2 K/uL   Lymph, poc 2.1 0.6 - 3.4   POC LYMPH PERCENT 38.9 10 - 50 %L   MID (cbc) 0.7 0 - 0.9   POC MID % 12.2 (A) 0 - 12 %M   POC Granulocyte 2.6 2 - 6.9   Granulocyte percent 48.9 37 - 80 %G   RBC 5.15 4.69 - 6.13 M/uL   Hemoglobin 14.0 (A) 9.5 - 13.5 g/dL   HCT, POC 43.0 (A) 29 -  41 %   MCV 83.4 76 - 111 fL   MCH, POC 27.2 27 - 31.2 pg   MCHC 32.6 31.8 - 35.4 g/dL   RDW, POC 15.4 %   Platelet Count, POC 244 142 - 424 K/uL   MPV 7.7 0 - 99.8 fL  POCT urinalysis dipstick  Result Value Ref Range   Color, UA yellow yellow   Clarity, UA clear clear   Glucose, UA negative negative mg/dL   Bilirubin, UA negative negative   Ketones, POC UA negative negative mg/dL   Spec Grav, UA 1.020 1.010 - 1.025   Blood, UA negative negative   pH, UA 6.5 5.0 - 8.0   Protein Ur, POC negative negative mg/dL   Urobilinogen,  UA 0.2 0.2 or 1.0 E.U./dL   Nitrite, UA Negative Negative   Leukocytes, UA Negative Negative  POCT Microscopic Urinalysis ([facility])  Result Value Ref Range   WBC,UR,HPF,POC None None WBC/hpf   RBC,UR,HPF,POC None None RBC/hpf   Bacteria None None, Too numerous to count   Mucus Absent Absent   Epithelial Cells, UR Per Microscopy None None, Too numerous to count cells/hpf       Assessment & Plan:    Riddik Senna is a 47 y.o. male Choking sensation - Plan: Ambulatory referral to ENT History of angioedema  -Long-standing gastrointestinal issues, may be related to recent surgery.  Note received from gastroenterologist after visit that he has discussed some recommendations with Mr. Belen.  However did agree with ENT eval to look into other potential causes.  No sign of angioedema on exam at present but ER/911 precautions discussed.  Essential hypertension - Plan: hydrALAZINE (APRESOLINE) 50 MG tablet, olmesartan (BENICAR) 20 MG tablet  -Stable on current regimen, no changes for now.  Chest wall pain - Plan: DG Ribs Unilateral W/Chest Right Dyspnea, unspecified type - Plan: D-dimer, quantitative (not at [facility]) Chest pain, unspecified type - Plan: D-dimer, quantitative (not at [facility])  -No concerning findings on rib series, d-dimer obtained although no known risk factors of PE which was reassuring.  RUQ pain - Plan: POCT CBC, Comprehensive metabolic panel  Right flank pain - Plan: Comprehensive metabolic panel, POCT urinalysis dipstick, POCT Microscopic Urinalysis ([facility]) Right upper quadrant abdominal pain - Plan: CT RENAL STONE STUDY Nephrolithiasis - Plan: CT RENAL STONE STUDY  -Reassuring urinalysis, check CT renal stone study but follow-up with urology recommended.  ER/RTC precautions if acute worsening  Meds ordered this encounter  Medications  . hydrALAZINE (APRESOLINE) 50 MG tablet    Sig: Take 1 tablet (50 mg total) by mouth every 8 (eight) hours.    Dispense:  90 tablet     Refill:  0  . olmesartan (BENICAR) 20 MG tablet    Sig: Take 1 tablet (20 mg total) by mouth daily.    Dispense:  30 tablet    Refill:  1   Patient Instructions   Blood pressure looks ok on current dose of meds. Prednisone can cause some elevations. I will refer you to ENT to evaluate the choking sensation.  If any swelling sensation in back of throat or tongue go to the emergency room or call 911 if needed.   Continue follow up with [name] regarding the vomiting symptoms.   I will check blood clot screening test, but if elevated may need CT scan.   Please call urology for follow up. I will check CT to see if any further stones that may be causing your pain.   If  there are any concerns on your rib xray I will let you know.   Return to the clinic or go to the nearest emergency room if any of your symptoms worsen or new symptoms occur.   Abdominal Pain, Adult Abdominal pain can be caused by many things. Often, abdominal pain is not serious and it gets better with no treatment or by being treated at home. However, sometimes abdominal pain is serious. Your health care provider will do a medical history and a physical exam to try to determine the cause of your abdominal pain. Follow these instructions at home:  Take over-the-counter and prescription medicines only as told by your health care provider. Do not take a laxative unless told by your health care provider.  Drink enough fluid to keep your urine clear or pale yellow.  Watch your condition for any changes.  Keep all follow-up visits as told by your health care provider. This is important. Contact a health care provider if:  Your abdominal pain changes or gets worse.  You are not hungry or you lose weight without trying.  You are constipated or have diarrhea for more than 2-3 days.  You have pain when you urinate or have a bowel movement.  Your abdominal pain wakes you up at night.  Your pain gets worse with meals,  after eating, or with certain foods.  You are throwing up and cannot keep anything down.  You have a fever. Get help right away if:  Your pain does not go away as soon as your health care provider told you to expect.  You cannot stop throwing up.  Your pain is only in areas of the abdomen, such as the right side or the left lower portion of the abdomen.  You have bloody or black stools, or stools that look like tar.  You have severe pain, cramping, or bloating in your abdomen.  You have signs of dehydration, such as: ? Dark urine, very little urine, or no urine. ? Cracked lips. ? Dry mouth. ? Sunken eyes. ? Sleepiness. ? Weakness. This information is not intended to replace advice given to you by your health care provider. Make sure you discuss any questions you have with your health care provider. Document Released: 03/29/2005 Document Revised: 01/07/2016 Document Reviewed: 12/01/2015 Elsevier Interactive Patient Education  2018 [employer].  Chest Wall Pain Chest wall pain is pain in or around the bones and muscles of your chest. Sometimes, an injury causes this pain. Sometimes, the cause may not be known. This pain may take several weeks or longer to get better. Follow these instructions at home: Pay attention to any changes in your symptoms. Take these actions to help with your pain:  Rest as told by your health care provider.  Avoid activities that cause pain. These include any activities that use your chest muscles or your abdominal and side muscles to lift heavy items.  If directed, apply ice to the painful area: ? Put ice in a plastic bag. ? Place a towel between your skin and the bag. ? Leave the ice on for 20 minutes, 2-3 times per day.  Take over-the-counter and prescription medicines only as told by your health care provider.  Do not use tobacco products, including cigarettes, chewing tobacco, and e-cigarettes. If you need help quitting, ask your health care  provider.  Keep all follow-up visits as told by your health care provider. This is important.  Contact a health care provider if:  You have a  fever.  Your chest pain becomes worse.  You have new symptoms. Get help right away if:  You have nausea or vomiting.  You feel sweaty or light-headed.  You have a cough with phlegm (sputum) or you cough up blood.  You develop shortness of breath. This information is not intended to replace advice given to you by your health care provider. Make sure you discuss any questions you have with your health care provider. Document Released: 06/19/2005 Document Revised: 10/28/2015 Document Reviewed: 09/14/2014 Elsevier Interactive Patient Education  [employer].   If you have lab work done today you will be contacted with your lab results within the next 2 weeks.  If you have not heard from [place] then please contact us. The fastest way to get your results is to register for My Chart.   IF you received an x-ray today, you will receive an invoice from [facility]. Please contact [facility] at [phone] with questions or concerns regarding your invoice.   IF you received labwork today, you will receive an invoice from [facility]. Please contact [facility] at [phone] with questions or concerns regarding your invoice.   Our billing staff will not be able to assist you with questions regarding bills from these companies.  You will be contacted with the lab results as soon as they are available. The fastest way to get your results is to activate your My Chart account. Instructions are located on the last page of this paperwork. If you have not heard from [place] regarding the results in 2 weeks, please contact this office.       I personally performed the services described in this documentation, which was scribed in my presence. The recorded information has been reviewed and considered for accuracy and completeness, addended by me  as needed, and agree with information above.  Signed,   Merri Ray, MD [facility] [place].  05/26/18 4:04 PM

## 2018-05-21 NOTE — ED Provider Notes (Signed)
Craig Guzman   [id number] 05/08/18 Arrival Time: 3383  ASSESSMENT & PLAN:  1. Globus pharyngeus   No respiratory distress or trouble with swallowing. No need for urgent ED evaluation at this time.  Meds ordered this encounter  Medications  . predniSONE (STERAPRED UNI-PAK 21 TAB) 10 MG (21) TBPK tablet    Sig: Take by mouth daily. Take as directed.    Dispense:  21 tablet    Refill:  0   No GERD symptoms. Given recent hospitalization and treatment with steroids will place on short course of prednisone. Close observation. If any worsening will proceed to the ED. Plans to schedule close f/u with his PCP.  Reviewed expectations re: course of current medical issues. Questions answered. Outlined signs and symptoms indicating need for more acute intervention. Patient verbalized understanding. After Visit Summary given.   SUBJECTIVE: History from: patient.  Craig Guzman is a 47 y.o. male who presents with "feeling like there's something in my throat when I swallow." Reports hospitalization a few weeks ago secondary to angioedema from lisinopril. "This is what I felt when that started." No tongue or lip swelling. No pain with swallowing. Tolerating normal PO intake without n/v. No SOB or respiratory difficulties. No CP. No new medications. Recently finished steroids.  Past Surgical History:  Procedure Laterality Date  . BALLOON DILATION N/A 12/06/2017   Procedure: BALLOON DILATION;  Surgeon: Gatha Mayer, MD;  Location: [facility];  Service: Endoscopy;  Laterality: N/A;  . CHOLECYSTECTOMY  2011   [facility]  . COLONOSCOPY  07/26/11   Crohn's colitis, ileitis  . ESOPHAGOGASTRODUODENOSCOPY  05/04/11, 07/26/11   granulomatous gastritis - Crohn's  . ESOPHAGOGASTRODUODENOSCOPY (EGD) WITH PROPOFOL N/A 03/28/2017   Procedure: ESOPHAGOGASTRODUODENOSCOPY (EGD) WITH PROPOFOL  with balloon dilation of duodenum;  Surgeon: [name], MD;  Location: [facility];  Service:  Gastroenterology;  Laterality: N/A;  . ESOPHAGOGASTRODUODENOSCOPY (EGD) WITH PROPOFOL N/A 12/06/2017   Procedure: ESOPHAGOGASTRODUODENOSCOPY (EGD) WITH PROPOFOL;  Surgeon: Gatha Mayer, MD;  Location: [facility];  Service: Endoscopy;  Laterality: N/A;  . FLEXIBLE SIGMOIDOSCOPY    . MULTIPLE TOOTH EXTRACTIONS  2005  . SHOULDER SURGERY     right  . UPPER GASTROINTESTINAL ENDOSCOPY    . VASECTOMY     bilateral w/lysis of penile adhesions   ROS: As per HPI. All other systems negative.   OBJECTIVE:  Vitals:   05/08/18 1840  BP: (!) 169/99  Pulse: 81  Resp: 18  Temp: 97.9 F (36.6 C)  TempSrc: Oral  SpO2: 98%    General appearance: alert; no distress Oropharynx: normal; moist; without lesions or swelling Lungs: clear to auscultation bilaterally; unlabored Heart: regular rate and rhythm Abdomen: soft; non-distended; no significant abdominal tenderness Back: no CVA tenderness Extremities: no edema; symmetrical with no gross deformities Skin: warm; dry Neurologic: normal gait Psychological: alert and cooperative; normal mood and affect   Allergies  Allergen Reactions  . Lisinopril Swelling  . Zithromax [Azithromycin] Hypertension                                               Past Medical History:  Diagnosis Date  . Allergy    trees/ grasses/ animals/dust/mold  . Anxiety   . Biliary dyskinesia   . Colloid thyroid nodule   . Crohn's disease ([place])    Stomach, terminal ileum, cecum  . Gastric ulcer  antral  . GERD (gastroesophageal reflux disease)   . History of migraine headaches   . HTN (hypertension)   . Kidney stone 09/20/2012  . Migraine   . Obesity   . Pneumonia 02/27/2014   ED  . Sleep apnea    has cpap- does not wear    Social History   Socioeconomic History  . Marital status: Married    Spouse name: Not on file  . Number of children: 3  . Years of education: Some college  . Highest education level: Not on file  Occupational History  .  Occupation: [employer]  Social Needs  . Financial resource strain: Not on file  . Food insecurity:    Worry: Not on file    Inability: Not on file  . Transportation needs:    Medical: Not on file    Non-medical: Not on file  Tobacco Use  . Smoking status: Former Smoker    Packs/day: 0.75    Years: 21.00    Pack years: 15.75    Types: Cigarettes    Last attempt to quit: 12/01/2017    Years since quitting: 0.4  . Smokeless tobacco: Former Systems developer    Types: Snuff  . Tobacco comment: Counseling sheet given in exam room   Substance and Sexual Activity  . Alcohol use: Yes    Alcohol/week: 0.0 standard drinks    Comment: occ  . Drug use: No  . Sexual activity: Yes    Partners: Female  Lifestyle  . Physical activity:    Days per week: Not on file    Minutes per session: Not on file  . Stress: Not on file  Relationships  . Social connections:    Talks on phone: Not on file    Gets together: Not on file    Attends religious service: Not on file    Active member of club or organization: Not on file    Attends meetings of clubs or organizations: Not on file    Relationship status: Not on file  . Intimate partner violence:    Fear of current or ex partner: Not on file    Emotionally abused: Not on file    Physically abused: Not on file    Forced sexual activity: Not on file  Other Topics Concern  . Not on file  Social History Narrative   Married. Education: [employer].    Lives at home w/ his wife and kids   Right-handed   Caffeine: 3 sodas per day   Family History  Problem Relation Age of Onset  . Colon polyps Father   . Diabetes Father   . Hypertension Father   . Hypertension Mother   . Asthma Mother   . Heart disease Maternal Grandmother   . Colon cancer Neg Hx   . Rectal cancer Neg Hx   . Stomach cancer Neg Hx   . Esophageal cancer Neg Hx      Vanessa Kick, MD 05/21/18 1504

## 2018-05-22 LAB — COMPREHENSIVE METABOLIC PANEL
ALBUMIN: 4.1 g/dL (ref 3.5–5.5)
ALK PHOS: 122 IU/L — AB (ref 39–117)
ALT: 25 IU/L (ref 0–44)
AST: 17 IU/L (ref 0–40)
Albumin/Globulin Ratio: 1.4 (ref 1.2–2.2)
BUN / CREAT RATIO: 8 — AB (ref 9–20)
BUN: 7 mg/dL (ref 6–24)
Bilirubin Total: 0.4 mg/dL (ref 0.0–1.2)
CO2: 19 mmol/L — AB (ref 20–29)
Calcium: 8.7 mg/dL (ref 8.7–10.2)
Chloride: 103 mmol/L (ref 96–106)
Creatinine, Ser: 0.85 mg/dL (ref 0.76–1.27)
GFR calc Af Amer: 121 mL/min/{1.73_m2} (ref >59)
GFR calc non Af Amer: 104 mL/min/{1.73_m2} (ref >59)
GLUCOSE: 94 mg/dL (ref 65–99)
Globulin, Total: 3 g/dL (ref 1.5–4.5)
Potassium: 3.9 mmol/L (ref 3.5–5.2)
Sodium: 139 mmol/L (ref 134–144)
TOTAL PROTEIN: 7.1 g/dL (ref 6.0–8.5)

## 2018-05-22 LAB — D-DIMER, QUANTITATIVE (NOT AT ARMC): D-DIMER: 0.43 mg{FEU}/L (ref 0.00–0.49)

## 2018-05-22 NOTE — Telephone Encounter (Signed)
I spoke to Craig Guzman and he is regurgitating at about 2 or 230 every night.  This head of bed is elevated.  He is now on metoclopramide 4 times a day.  He eats at 9 and goes to bed at 10 however.  Has an ENT appointment pending regarding this choking sensation in his throat.  Talked about the possibility of doing an endoscopy and I might need to do that before we had him do that he really needs to be much better and restricting food intake prior to bedtime and at a minimum of 3 and hopefully 4 hours before lying down.  He has a follow-up appointment with me on 12/12.  I am waiting for paperwork about his longer-term disability through work.

## 2018-05-27 ENCOUNTER — Telehealth: Payer: Self-pay | Admitting: Internal Medicine

## 2018-05-27 ENCOUNTER — Ambulatory Visit
Admission: RE | Admit: 2018-05-27 | Discharge: 2018-05-27 | Disposition: A | Discharge status: Home / Self Care | Payer: Managed Care, Other (non HMO) | Source: Ambulatory Visit | Attending: Family Medicine | Admitting: Family Medicine

## 2018-05-27 ENCOUNTER — Encounter: Payer: Self-pay | Admitting: Internal Medicine

## 2018-05-27 DIAGNOSIS — N200 Calculus of kidney: Secondary | ICD-10-CM

## 2018-05-27 DIAGNOSIS — R1011 Right upper quadrant pain: Secondary | ICD-10-CM

## 2018-05-27 NOTE — Telephone Encounter (Signed)
Error

## 2018-05-27 NOTE — Telephone Encounter (Signed)
Pt's wife [name] called stating that pt has diarrhea and nausea, he needs some advise.Pls call pt and if he does not answer please call Mrs. Fryman.

## 2018-05-27 NOTE — Telephone Encounter (Signed)
Pt states he is doing some better but at night he is still having regurgitation at night. He catches it coming up and has choking with it. Also reports he may go a day and not have a BM then the next day he may have 3-4 diarrhea stools. Pt has not taken anything for the diarrhea as he didn't know if [name] may want to change something. Pt also wanted to know if we have received any papers from [facility]. Note copied to [place] to inquire if she has seen this form. [name] please advise.

## 2018-05-28 NOTE — Telephone Encounter (Signed)
I have not seen papers either  He should try taking Benefiber 1 tablespoon daily to see if he can smooth out the bowel movements and go more regularly.

## 2018-05-28 NOTE — Telephone Encounter (Signed)
Spoke with pt and he is aware.

## 2018-06-03 NOTE — Telephone Encounter (Signed)
Dr Carlean Purl has the papers currently.

## 2018-06-04 ENCOUNTER — Encounter: Payer: Self-pay | Admitting: Family Medicine

## 2018-06-04 ENCOUNTER — Other Ambulatory Visit: Payer: Self-pay

## 2018-06-04 ENCOUNTER — Ambulatory Visit (INDEPENDENT_AMBULATORY_CARE_PROVIDER_SITE_OTHER): Payer: Managed Care, Other (non HMO) | Admitting: Family Medicine

## 2018-06-04 VITALS — BP 132/82 | HR 99 | Temp 98.7°F | Ht 72.0 in | Wt 353.2 lb

## 2018-06-04 DIAGNOSIS — R351 Nocturia: Secondary | ICD-10-CM

## 2018-06-04 DIAGNOSIS — J069 Acute upper respiratory infection, unspecified: Secondary | ICD-10-CM

## 2018-06-04 DIAGNOSIS — R109 Unspecified abdominal pain: Secondary | ICD-10-CM

## 2018-06-04 DIAGNOSIS — I10 Essential (primary) hypertension: Secondary | ICD-10-CM | POA: Diagnosis not present

## 2018-06-04 MED ORDER — DOXAZOSIN MESYLATE 8 MG PO TABS: 8.0000 mg | ORAL_TABLET | Freq: Every day | ORAL | 0 refills | Status: DC

## 2018-06-04 NOTE — Progress Notes (Signed)
Subjective:    Patient ID: Craig Guzman, male    DOB: 1970/08/29, 47 y.o.   MRN: [id number]  HPI [name] is a 47 y.o. male Presents today for: Chief Complaint  Patient presents with  . Headache    tightness in head  . Nasal Congestion    drainage   started yesterday   . Medication Refill    cardura   Headache with nasal congestion: Has used Flonase in the past for congestion, and had been prescribed azelastine nasal spray previously but was not using at visit on October 25 visit. Current congestion for past 2 days. PND, congestion, R eye had some crusting in am only. No daytime discharge. No cough. Slight frontal headache, not bad like migraine. Using flonase 1spr/nost QD. Azelastine 1 spray every other day. Doesn't like as hurts stomach. Does sniff.  No fever. No known sick contacts. Drinking fluids.   Hypertension: BP Readings from Last 3 Encounters:  06/04/18 132/82  05/21/18 130/82  05/15/18 (!) 141/80   Lab Results  Component Value Date   CREATININE 0.85 05/21/2018   See previous visits.  Has had to adjust around his antihypertensives.  Did add Cardura to help with blood pressure as well as nocturia.  Currently takes 8 mg daily. Only 1 episode of nocturia per night. No new side effects, no dizziness. No urinary retention. PSA 0.3 in May.   Has follow up with specialists planned. Episodic R abd pain, no worsening. Eating ok. Reviewed CT. No skin rash.   Patient Active Problem List   Diagnosis Date Noted  . Angioedema 04/19/2018  . Crohn's disease of small intestine with other complication ([place]) 74/14/2395  . Duodenal stricture   . Intractable vomiting with nausea   . Cigarette nicotine dependence without complication 32/08/3341  . Shifting sleep-work schedule, affecting sleep 05/15/2016  . Sleep deprivation 05/15/2016  . Sleep related hypoventilation in conditions classified elsewhere 05/15/2016  . Sleep related headaches 05/15/2016  . Morbid obesity ([place])  09/19/2013  . Diarrhea 07/10/2013  . 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 Hypertension 11/26/2012  . Gastroparesis??? 12/08/2011  . Common migraine without aura 12/08/2011  . Long-term use of immunosuppressant medication 08/08/2011  . Gastroduodenal Crohn's disease with intestinal obstruction ([place]) 05/17/2011  . GERD (gastroesophageal reflux disease) 04/21/2011   Past Medical History:  Diagnosis Date  . Allergy    trees/ grasses/ animals/dust/mold  . Anxiety   . Biliary dyskinesia   . Colloid thyroid nodule   . Crohn's disease ([place])    Stomach, terminal ileum, cecum  . Gastric ulcer    antral  . GERD (gastroesophageal reflux disease)   . History of migraine headaches   . HTN (hypertension)   . Kidney stone 09/20/2012  . Migraine   . Obesity   . Pneumonia 02/27/2014   ED  . Sleep apnea    has cpap- does not wear    Past Surgical History:  Procedure Laterality Date  . BALLOON DILATION N/A 12/06/2017   Procedure: BALLOON DILATION;  Surgeon: Gatha Mayer, MD;  Location: [facility];  Service: Endoscopy;  Laterality: N/A;  . CHOLECYSTECTOMY  2011   [place]  . COLONOSCOPY  07/26/11   Crohn's colitis, ileitis  . ESOPHAGOGASTRODUODENOSCOPY  05/04/11, 07/26/11   granulomatous gastritis - Crohn's  . ESOPHAGOGASTRODUODENOSCOPY (EGD) WITH PROPOFOL N/A 03/28/2017   Procedure: ESOPHAGOGASTRODUODENOSCOPY (EGD) WITH PROPOFOL  with balloon dilation of duodenum;  Surgeon: [name], MD;  Location: [facility];  Service: Gastroenterology;  Laterality: N/A;  .  ESOPHAGOGASTRODUODENOSCOPY (EGD) WITH PROPOFOL N/A 12/06/2017   Procedure: ESOPHAGOGASTRODUODENOSCOPY (EGD) WITH PROPOFOL;  Surgeon: Gatha Mayer, MD;  Location: [facility];  Service: Endoscopy;  Laterality: N/A;  . FLEXIBLE SIGMOIDOSCOPY    . MULTIPLE TOOTH EXTRACTIONS  2005  . SHOULDER SURGERY     right  . UPPER GASTROINTESTINAL ENDOSCOPY    . VASECTOMY     bilateral w/lysis of penile adhesions   Allergies    Allergen Reactions  . Lisinopril Swelling  . Zithromax [Azithromycin] Hypertension   Prior to Admission medications   Medication Sig Start Date End Date Taking? Authorizing Provider  azelastine (ASTELIN) 0.1 % nasal spray PLACE 2 SPRAYS INTO BOTH NOSTRILS 2 (TWO) TIMES DAILY. USE IN EACH NOSTRIL AS DIRECTED Patient taking differently: Place 2 sprays into both nostrils 2 (two) times daily as needed for allergies. Use in each nostril as directed 01/04/18  Yes [name], MD  cetirizine (ZYRTEC) 10 MG tablet Take 1 tablet (10 mg total) by mouth daily as needed for allergies. 05/01/18  Yes Bast, Traci A, NP  cyclobenzaprine (FLEXERIL) 10 MG tablet Take 10 mg by mouth 3 (three) times daily as needed for muscle spasms.   Yes [provider]  doxazosin (CARDURA) 8 MG tablet Take 1 tablet (8 mg total) by mouth daily. 03/01/18  Yes [name], MD  fluticasone (FLONASE) 50 MCG/ACT nasal spray Place 1 spray into both nostrils daily. 05/01/18  Yes Bast, Traci A, NP  hydrALAZINE (APRESOLINE) 50 MG tablet Take 1 tablet (50 mg total) by mouth every 8 (eight) hours. 05/21/18 06/20/18 Yes [name], MD  mercaptopurine (PURINETHOL) 50 MG tablet Take 1 tablet (50 mg total) by mouth daily. Give on an empty stomach 1 hour before or 2 hours after meals. 05/02/17  Yes Gatha Mayer, MD  metoCLOPramide (REGLAN) 10 MG tablet Take 1 tablet (10 mg total) by mouth 4 (four) times daily -  before meals and at bedtime. 05/16/18  Yes Gatha Mayer, MD  olmesartan (BENICAR) 20 MG tablet Take 1 tablet (20 mg total) by mouth daily. 05/21/18  Yes [name], MD  omeprazole-sodium bicarbonate (ZEGERID) 40-1100 MG capsule TAKE 1 CAPSULE BY MOUTH AT BEDTIME. Patient taking differently: Take 1 capsule by mouth daily.  04/25/17  Yes [name], MD  ondansetron (ZOFRAN-ODT) 4 MG disintegrating tablet TAKE 1 TABLET BY MOUTH EVERY 8 HOURS AS NEEDED FOR NAUSEA AND VOMITING Patient taking  differently: Take 4 mg by mouth every 8 (eight) hours as needed for nausea or vomiting.  01/15/18  Yes Gatha Mayer, MD  rizatriptan (MAXALT-MLT) 10 MG disintegrating tablet TAKE 1 TABLET (10 MG TOTAL) BY MOUTH 3 (THREE) TIMES DAILY AS NEEDED FOR MIGRAINE. 05/09/17  Yes Kathrynn Ducking, MD  ustekinumab (STELARA) 90 MG/ML SOSY injection Inject 1 mL (90 mg total) into the skin every 8 (eight) weeks. 07/25/17  Yes Gatha Mayer, MD  verapamil (CALAN-SR) 180 MG CR tablet Take 2 tablets (360 mg total) by mouth daily. 11/08/17  Yes [name], MD   Social History   Socioeconomic History  . Marital status: Married    Spouse name: Not on file  . Number of children: 3  . Years of education: Some college  . Highest education level: Not on file  Occupational History  . Occupation: [employer]  Social Needs  . Financial resource strain: Not on file  . Food insecurity:    Worry: Not on file  Inability: Not on file  . Transportation needs:    Medical: Not on file    Non-medical: Not on file  Tobacco Use  . Smoking status: Former Smoker    Packs/day: 0.75    Years: 21.00    Pack years: 15.75    Types: Cigarettes    Last attempt to quit: 12/01/2017    Years since quitting: 0.5  . Smokeless tobacco: Former Systems developer    Types: Snuff  . Tobacco comment: Counseling sheet given in exam room   Substance and Sexual Activity  . Alcohol use: Yes    Alcohol/week: 0.0 standard drinks    Comment: occ  . Drug use: No  . Sexual activity: Yes    Partners: Female  Lifestyle  . Physical activity:    Days per week: Not on file    Minutes per session: Not on file  . Stress: Not on file  Relationships  . Social connections:    Talks on phone: Not on file    Gets together: Not on file    Attends religious service: Not on file    Active member of club or organization: Not on file    Attends meetings of clubs or organizations: Not on file    Relationship status: Not on file  . Intimate partner  violence:    Fear of current or ex partner: Not on file    Emotionally abused: Not on file    Physically abused: Not on file    Forced sexual activity: Not on file  Other Topics Concern  . Not on file  Social History Narrative   Married. Education: [employer].    Lives at home w/ his wife and kids   Right-handed   Caffeine: 3 sodas per day    Review of Systems     Objective:   Physical Exam  Constitutional: He is oriented to person, place, and time. He appears well-developed and well-nourished.  HENT:  Head: Normocephalic and atraumatic.  Right Ear: Tympanic membrane, external ear and ear canal normal.  Left Ear: Tympanic membrane, external ear and ear canal normal.  Nose: No rhinorrhea.  Mouth/Throat: Oropharynx is clear and moist and mucous membranes are normal. No oropharyngeal exudate or posterior oropharyngeal erythema.  Bilateral edematous turbinates.  No discharge.  Maxillary sinuses nontender, slight frontal sinus tenderness.  Eyes: Pupils are equal, round, and reactive to light. Conjunctivae are normal.  Neck: Neck supple.  Cardiovascular: Normal rate, regular rhythm, normal heart sounds and intact distal pulses.  No murmur heard. Pulmonary/Chest: Effort normal and breath sounds normal. He has no wheezes. He has no rhonchi. He has no rales.  Abdominal: Soft. There is no tenderness.  Lymphadenopathy:    He has no cervical adenopathy.  Neurological: He is alert and oriented to person, place, and time.  Skin: Skin is warm and dry. No rash noted.  Psychiatric: He has a normal mood and affect. His behavior is normal.  Vitals reviewed.  Vitals:   06/04/18 1033 06/04/18 1038  BP: (!) 154/91 132/82  Pulse: 99   Temp: 98.7 F (37.1 C)   TempSrc: Oral   SpO2: 97%   Weight: (!) 353 lb 3.2 oz (160.2 kg)   Height: 6' (1.829 m)       Assessment & Plan:   Duey Liller is a 47 y.o. male Acute upper respiratory infection  -Possible allergies versus more likely viral URI.   Symptomatic care discussed including correct technique for nasal sprays to lessen drainage down back  of throat.  RTC precautions  Nocturia - Plan: doxazosin (CARDURA) 8 MG tablet Essential hypertension - Plan: doxazosin (CARDURA) 8 MG tablet  -Hypertension currently controlled, tolerating Cardura including improved nocturia.  Continue same dose  Abdominal wall pain in right flank  -Reviewed CT findings.  No sign of cellulitis on exam.  Has planned follow-up with urology and gastroenterology.  Intermittent pain could be radiating pain from back as well.  If more persistent or worsening, RTC/ER precautions and watch for skin changes.  Meds ordered this encounter  Medications  . doxazosin (CARDURA) 8 MG tablet    Sig: Take 1 tablet (8 mg total) by mouth daily.    Dispense:  90 tablet    Refill:  0   Patient Instructions   Current congestion likely due to a virus.  Can try saline nasal spray few times per day as needed, azelastine spray as needed.  Use technique we discussed and avoid sniffing medication.  Make sure to drink plenty fluids and rest.  If fevers or worsening symptoms return for recheck.  I do not see any signs of cellulitis or skin infection at this time but if any worsening right-sided abdominal pain or new skin rash/abnormalities, be seen right away.  Keep follow-up with specialists as planned.  Cardura refilled at same dose, blood pressure looks good today.  Let me know if there are questions.  Return to the clinic or go to the nearest emergency room if any of your symptoms worsen or new symptoms occur.   If you have lab work done today you will be contacted with your lab results within the next 2 weeks.  If you have not heard from [place] then please contact us. The fastest way to get your results is to register for My Chart.   IF you received an x-ray today, you will receive an invoice from [facility]. Please contact [facility] at [phone] with  questions or concerns regarding your invoice.   IF you received labwork today, you will receive an invoice from [place]. Please contact [facility] at [phone] with questions or concerns regarding your invoice.   Our billing staff will not be able to assist you with questions regarding bills from these companies.  You will be contacted with the lab results as soon as they are available. The fastest way to get your results is to activate your My Chart account. Instructions are located on the last page of this paperwork. If you have not heard from [place] regarding the results in 2 weeks, please contact this office.       Signed,   Merri Ray, MD [facility].  06/05/18 9:38 AM

## 2018-06-04 NOTE — Patient Instructions (Addendum)
Current congestion likely due to a virus.  Can try saline nasal spray few times per day as needed, azelastine spray as needed.  Use technique we discussed and avoid sniffing medication.  Make sure to drink plenty fluids and rest.  If fevers or worsening symptoms return for recheck.  I do not see any signs of cellulitis or skin infection at this time but if any worsening right-sided abdominal pain or new skin rash/abnormalities, be seen right away.  Keep follow-up with specialists as planned.  Cardura refilled at same dose, blood pressure looks good today.  Let me know if there are questions.  Return to the clinic or go to the nearest emergency room if any of your symptoms worsen or new symptoms occur.   If you have lab work done today you will be contacted with your lab results within the next 2 weeks.  If you have not heard from [place] then please contact us. The fastest way to get your results is to register for My Chart.   IF you received an x-ray today, you will receive an invoice from [facility]. Please contact [facility] at [phone] with questions or concerns regarding your invoice.   IF you received labwork today, you will receive an invoice from [place]. Please contact [facility] at [phone] with questions or concerns regarding your invoice.   Our billing staff will not be able to assist you with questions regarding bills from these companies.  You will be contacted with the lab results as soon as they are available. The fastest way to get your results is to activate your My Chart account. Instructions are located on the last page of this paperwork. If you have not heard from [place] regarding the results in 2 weeks, please contact this office.

## 2018-06-05 ENCOUNTER — Encounter: Payer: Self-pay | Admitting: Family Medicine

## 2018-06-13 ENCOUNTER — Ambulatory Visit (INDEPENDENT_AMBULATORY_CARE_PROVIDER_SITE_OTHER): Payer: Managed Care, Other (non HMO) | Admitting: Internal Medicine

## 2018-06-13 ENCOUNTER — Encounter: Payer: Self-pay | Admitting: Internal Medicine

## 2018-06-13 VITALS — BP 156/90 | HR 78 | Ht 72.0 in | Wt 353.4 lb

## 2018-06-13 DIAGNOSIS — R1013 Epigastric pain: Secondary | ICD-10-CM

## 2018-06-13 DIAGNOSIS — K50012 Crohn's disease of small intestine with intestinal obstruction: Secondary | ICD-10-CM | POA: Diagnosis not present

## 2018-06-13 NOTE — Progress Notes (Signed)
Craig Guzman 47 y.o. [birth year] [id number]  Assessment & Plan:    I think he probably does have dumping syndrome. Not know why he hurts soon after eating.  It does not sound biliary.  Imaging so far has been relatively unrevealing.  The addition of metoclopramide has made a difference.   Upper endoscopy is needed to sort things out, i.e. does he have Crohn's problems in the upper GI tract still again.  Scheduled that for the [place] but after he left I realized his weight is over 350 pounds so he has to be done at the hospital so we are looking for a spot.  Subjective:   Chief Complaint: Follow-up of Crohn's disease and abdominal pain  HPI Craig Guzman is having relatively rapid onset of upper abdominal pain epigastric mainly right after eating.  He is not vomiting, he is generally moving his bowels.  He will have episodes though where he has rapid diarrhea after eating and feel kind of wiped out.  No more the angioedema since stopping lisinopril.  Has a follow-up with ENT regarding the sense of swollen neck or throat on December 23.  Her term disability ends on Christmas and he is supposed to then convert to some sort of longer-term disability.   Using Benefiber and has a bowel movement about 1 every 3 days approximately.  Eats a varied diet and different foods trigger abdominal pain there is not one clear pattern.  His wife told him she thought he had dumping syndrome.  Wt Readings from Last 3 Encounters:  06/13/18 (!) 353 lb 6 oz (160.3 kg)  06/04/18 (!) 353 lb 3.2 oz (160.2 kg)  05/21/18 (!) 350 lb 12.8 oz (159.1 kg)    Allergies  Allergen Reactions  . Lisinopril Swelling  . Zithromax [Azithromycin] Hypertension   Current Meds  Medication Sig  . azelastine (ASTELIN) 0.1 % nasal spray PLACE 2 SPRAYS INTO BOTH NOSTRILS 2 (TWO) TIMES DAILY. USE IN EACH NOSTRIL AS DIRECTED (Patient taking differently: Place 2 sprays into both nostrils 2 (two) times daily as needed for allergies. Use in  each nostril as directed)  . cetirizine (ZYRTEC) 10 MG tablet Take 1 tablet (10 mg total) by mouth daily as needed for allergies.  . cyclobenzaprine (FLEXERIL) 10 MG tablet Take 10 mg by mouth 3 (three) times daily as needed for muscle spasms.  Marland Kitchen doxazosin (CARDURA) 8 MG tablet Take 1 tablet (8 mg total) by mouth daily.  . fluticasone (FLONASE) 50 MCG/ACT nasal spray Place 1 spray into both nostrils daily.  . hydrALAZINE (APRESOLINE) 50 MG tablet Take 1 tablet (50 mg total) by mouth every 8 (eight) hours.  . mercaptopurine (PURINETHOL) 50 MG tablet Take 1 tablet (50 mg total) by mouth daily. Give on an empty stomach 1 hour before or 2 hours after meals.  . metoCLOPramide (REGLAN) 10 MG tablet Take 1 tablet (10 mg total) by mouth 4 (four) times daily -  before meals and at bedtime.  Marland Kitchen olmesartan (BENICAR) 20 MG tablet Take 1 tablet (20 mg total) by mouth daily.  Marland Kitchen omeprazole-sodium bicarbonate (ZEGERID) 40-1100 MG capsule TAKE 1 CAPSULE BY MOUTH AT BEDTIME. (Patient taking differently: Take 1 capsule by mouth daily. )  . ondansetron (ZOFRAN-ODT) 4 MG disintegrating tablet TAKE 1 TABLET BY MOUTH EVERY 8 HOURS AS NEEDED FOR NAUSEA AND VOMITING (Patient taking differently: Take 4 mg by mouth every 8 (eight) hours as needed for nausea or vomiting. )  . rizatriptan (MAXALT-MLT) 10 MG disintegrating tablet TAKE  1 TABLET (10 MG TOTAL) BY MOUTH 3 (THREE) TIMES DAILY AS NEEDED FOR MIGRAINE.  Marland Kitchen ustekinumab (STELARA) 90 MG/ML SOSY injection Inject 1 mL (90 mg total) into the skin every 8 (eight) weeks.  . verapamil (CALAN-SR) 180 MG CR tablet Take 2 tablets (360 mg total) by mouth daily.  . Wheat Dextrin (BENEFIBER DRINK MIX PO) Take by mouth daily.   Past Medical History:  Diagnosis Date  . Allergy    trees/ grasses/ animals/dust/mold  . Anxiety   . Biliary dyskinesia   . Colloid thyroid nodule   . Crohn's disease ([place])    Stomach, terminal ileum, cecum  . Gastric ulcer    antral  . GERD  (gastroesophageal reflux disease)   . History of migraine headaches   . HTN (hypertension)   . Kidney stone 09/20/2012  . Migraine   . Obesity   . Pneumonia 02/27/2014   ED  . Sleep apnea    has cpap- does not wear    Past Surgical History:  Procedure Laterality Date  . BALLOON DILATION N/A 12/06/2017   Procedure: BALLOON DILATION;  Surgeon: Gatha Mayer, MD;  Location: [facility];  Service: Endoscopy;  Laterality: N/A;  . CHOLECYSTECTOMY  2011   [facility]  . COLONOSCOPY  07/26/11   Crohn's colitis, ileitis  . ESOPHAGOGASTRODUODENOSCOPY  05/04/11, 07/26/11   granulomatous gastritis - Crohn's  . ESOPHAGOGASTRODUODENOSCOPY (EGD) WITH PROPOFOL N/A 03/28/2017   Procedure: ESOPHAGOGASTRODUODENOSCOPY (EGD) WITH PROPOFOL  with balloon dilation of duodenum;  Surgeon: [name], MD;  Location: [facility];  Service: Gastroenterology;  Laterality: N/A;  . ESOPHAGOGASTRODUODENOSCOPY (EGD) WITH PROPOFOL N/A 12/06/2017   Procedure: ESOPHAGOGASTRODUODENOSCOPY (EGD) WITH PROPOFOL;  Surgeon: Gatha Mayer, MD;  Location: [facility];  Service: Endoscopy;  Laterality: N/A;  . FLEXIBLE SIGMOIDOSCOPY    . MULTIPLE TOOTH EXTRACTIONS  2005  . SHOULDER SURGERY     right  . UPPER GASTROINTESTINAL ENDOSCOPY    . VASECTOMY     bilateral w/lysis of penile adhesions   Social History   Social History Narrative   Married. Education: [employer].    Lives at home w/ his wife and kids   Right-handed   Caffeine: 3 sodas per day   family history includes Asthma in his mother; Colon polyps in his father; Diabetes in his father; Heart disease in his maternal grandmother; Hypertension in his father and mother.   Review of Systems   Objective:   Physical Exam BP (!) 156/90   Pulse 78   Ht 6' (1.829 m)   Wt (!) 353 lb 6 oz (160.3 kg)   BMI 47.93 kg/m  Morbidly obese no acute distress Lungs clear  heart sounds normal Abdomen is obese soft there is a little bit of fullness in the epigastrium  is mildly tender there to palpation.  Bowel sounds are present. No signs of tardive dyskinesia

## 2018-06-13 NOTE — Assessment & Plan Note (Signed)
EGD

## 2018-06-13 NOTE — H&P (View-Only) (Signed)
Zakarie Sturdivant 47 y.o. 1971/04/17 [id number]  Assessment & Plan:    I think he probably does have dumping syndrome. Not know why he hurts soon after eating.  It does not sound biliary.  Imaging so far has been relatively unrevealing.  The addition of metoclopramide has made a difference.   Upper endoscopy is needed to sort things out, i.e. does he have Crohn's problems in the upper GI tract still again.  Scheduled that for the [place] but after he left I realized his weight is over 350 pounds so he has to be done at the hospital so we are looking for a spot.  Subjective:   Chief Complaint: Follow-up of Crohn's disease and abdominal pain  HPI Yuri is having relatively rapid onset of upper abdominal pain epigastric mainly right after eating.  He is not vomiting, he is generally moving his bowels.  He will have episodes though where he has rapid diarrhea after eating and feel kind of wiped out.  No more the angioedema since stopping lisinopril.  Has a follow-up with ENT regarding the sense of swollen neck or throat on December 23.  Her term disability ends on Christmas and he is supposed to then convert to some sort of longer-term disability.   Using Benefiber and has a bowel movement about 1 every 3 days approximately.  Eats a varied diet and different foods trigger abdominal pain there is not one clear pattern.  His wife told him she thought he had dumping syndrome.  Wt Readings from Last 3 Encounters:  06/13/18 (!) 353 lb 6 oz (160.3 kg)  06/04/18 (!) 353 lb 3.2 oz (160.2 kg)  05/21/18 (!) 350 lb 12.8 oz (159.1 kg)    Allergies  Allergen Reactions  . Lisinopril Swelling  . Zithromax [Azithromycin] Hypertension   Current Meds  Medication Sig  . azelastine (ASTELIN) 0.1 % nasal spray PLACE 2 SPRAYS INTO BOTH NOSTRILS 2 (TWO) TIMES DAILY. USE IN EACH NOSTRIL AS DIRECTED (Patient taking differently: Place 2 sprays into both nostrils 2 (two) times daily as needed for allergies. Use in  each nostril as directed)  . cetirizine (ZYRTEC) 10 MG tablet Take 1 tablet (10 mg total) by mouth daily as needed for allergies.  . cyclobenzaprine (FLEXERIL) 10 MG tablet Take 10 mg by mouth 3 (three) times daily as needed for muscle spasms.  Marland Kitchen doxazosin (CARDURA) 8 MG tablet Take 1 tablet (8 mg total) by mouth daily.  . fluticasone (FLONASE) 50 MCG/ACT nasal spray Place 1 spray into both nostrils daily.  . hydrALAZINE (APRESOLINE) 50 MG tablet Take 1 tablet (50 mg total) by mouth every 8 (eight) hours.  . mercaptopurine (PURINETHOL) 50 MG tablet Take 1 tablet (50 mg total) by mouth daily. Give on an empty stomach 1 hour before or 2 hours after meals.  . metoCLOPramide (REGLAN) 10 MG tablet Take 1 tablet (10 mg total) by mouth 4 (four) times daily -  before meals and at bedtime.  Marland Kitchen olmesartan (BENICAR) 20 MG tablet Take 1 tablet (20 mg total) by mouth daily.  Marland Kitchen omeprazole-sodium bicarbonate (ZEGERID) 40-1100 MG capsule TAKE 1 CAPSULE BY MOUTH AT BEDTIME. (Patient taking differently: Take 1 capsule by mouth daily. )  . ondansetron (ZOFRAN-ODT) 4 MG disintegrating tablet TAKE 1 TABLET BY MOUTH EVERY 8 HOURS AS NEEDED FOR NAUSEA AND VOMITING (Patient taking differently: Take 4 mg by mouth every 8 (eight) hours as needed for nausea or vomiting. )  . rizatriptan (MAXALT-MLT) 10 MG disintegrating tablet TAKE  1 TABLET (10 MG TOTAL) BY MOUTH 3 (THREE) TIMES DAILY AS NEEDED FOR MIGRAINE.  Marland Kitchen ustekinumab (STELARA) 90 MG/ML SOSY injection Inject 1 mL (90 mg total) into the skin every 8 (eight) weeks.  . verapamil (CALAN-SR) 180 MG CR tablet Take 2 tablets (360 mg total) by mouth daily.  . Wheat Dextrin (BENEFIBER DRINK MIX PO) Take by mouth daily.   Past Medical History:  Diagnosis Date  . Allergy    trees/ grasses/ animals/dust/mold  . Anxiety   . Biliary dyskinesia   . Colloid thyroid nodule   . Crohn's disease ([place])    Stomach, terminal ileum, cecum  . Gastric ulcer    antral  . GERD  (gastroesophageal reflux disease)   . History of migraine headaches   . HTN (hypertension)   . Kidney stone 09/20/2012  . Migraine   . Obesity   . Pneumonia 02/27/2014   ED  . Sleep apnea    has cpap- does not wear    Past Surgical History:  Procedure Laterality Date  . BALLOON DILATION N/A 12/06/2017   Procedure: BALLOON DILATION;  Surgeon: Gatha Mayer, MD;  Location: [facility];  Service: Endoscopy;  Laterality: N/A;  . CHOLECYSTECTOMY  2011   [facility]  . COLONOSCOPY  07/26/11   Crohn's colitis, ileitis  . ESOPHAGOGASTRODUODENOSCOPY  05/04/11, 07/26/11   granulomatous gastritis - Crohn's  . ESOPHAGOGASTRODUODENOSCOPY (EGD) WITH PROPOFOL N/A 03/28/2017   Procedure: ESOPHAGOGASTRODUODENOSCOPY (EGD) WITH PROPOFOL  with balloon dilation of duodenum;  Surgeon: [name], MD;  Location: [facility];  Service: Gastroenterology;  Laterality: N/A;  . ESOPHAGOGASTRODUODENOSCOPY (EGD) WITH PROPOFOL N/A 12/06/2017   Procedure: ESOPHAGOGASTRODUODENOSCOPY (EGD) WITH PROPOFOL;  Surgeon: Gatha Mayer, MD;  Location: [facility];  Service: Endoscopy;  Laterality: N/A;  . FLEXIBLE SIGMOIDOSCOPY    . MULTIPLE TOOTH EXTRACTIONS  2005  . SHOULDER SURGERY     right  . UPPER GASTROINTESTINAL ENDOSCOPY    . VASECTOMY     bilateral w/lysis of penile adhesions   Social History   Social History Narrative   Married. Education: [employer].    Lives at home w/ his wife and kids   Right-handed   Caffeine: 3 sodas per day   family history includes Asthma in his mother; Colon polyps in his father; Diabetes in his father; Heart disease in his maternal grandmother; Hypertension in his father and mother.   Review of Systems   Objective:   Physical Exam BP (!) 156/90   Pulse 78   Ht 6' (1.829 m)   Wt (!) 353 lb 6 oz (160.3 kg)   BMI 47.93 kg/m  Morbidly obese no acute distress Lungs clear  heart sounds normal Abdomen is obese soft there is a little bit of fullness in the epigastrium  is mildly tender there to palpation.  Bowel sounds are present. No signs of tardive dyskinesia

## 2018-06-17 ENCOUNTER — Other Ambulatory Visit: Payer: Self-pay | Admitting: Internal Medicine

## 2018-06-17 ENCOUNTER — Telehealth: Payer: Self-pay | Admitting: Internal Medicine

## 2018-06-17 ENCOUNTER — Encounter: Payer: Managed Care, Other (non HMO) | Admitting: Internal Medicine

## 2018-06-17 DIAGNOSIS — R1013 Epigastric pain: Secondary | ICD-10-CM

## 2018-06-17 NOTE — Telephone Encounter (Signed)
Patient informed and he is now able to do it on 06/21/18. I have his EGD set up for 7:30AM at [facility]. Went over EGD instructions over the phone.

## 2018-06-17 NOTE — Telephone Encounter (Signed)
Patient states he is returning someone's call about scheduling a procedure at the hosp. Patient seen by [name] 12.12.19

## 2018-06-20 NOTE — Anesthesia Preprocedure Evaluation (Addendum)
Anesthesia Evaluation  Patient identified by MRN, date of birth, ID band Patient awake    Reviewed: Allergy & Precautions, NPO status , Patient's Chart, lab work & pertinent test results  Airway Mallampati: II  TM Distance: >3 FB Neck ROM: Full    Dental no notable dental hx.    Pulmonary pneumonia, Current Smoker, former smoker,    Pulmonary exam normal breath sounds clear to auscultation       Cardiovascular hypertension, Normal cardiovascular exam Rhythm:Regular Rate:Normal     Neuro/Psych  Headaches, negative neurological ROS  negative psych ROS   GI/Hepatic Neg liver ROS, hiatal hernia, PUD, GERD  ,  Endo/Other  Morbid obesity  Renal/GU Renal disease     Musculoskeletal negative musculoskeletal ROS (+)   Abdominal (+) + obese,   Peds  Hematology negative hematology ROS (+)   Anesthesia Other Findings   Reproductive/Obstetrics negative OB ROS                            Anesthesia Physical  Anesthesia Plan  ASA: III  Anesthesia Plan: MAC   Post-op Pain Management:    Induction: Intravenous  PONV Risk Score and Plan: 0 and Ondansetron and Propofol infusion  Airway Management Planned: Nasal Cannula, Natural Airway and Mask  Additional Equipment:   Intra-op Plan:   Post-operative Plan:   Informed Consent: I have reviewed the patients History and Physical, chart, labs and discussed the procedure including the risks, benefits and alternatives for the proposed anesthesia with the patient or authorized representative who has indicated his/her understanding and acceptance.   Dental advisory given  Plan Discussed with: CRNA, Anesthesiologist and Surgeon  Anesthesia Plan Comments:        Anesthesia Quick Evaluation

## 2018-06-21 ENCOUNTER — Ambulatory Visit (HOSPITAL_COMMUNITY): Payer: Managed Care, Other (non HMO) | Admitting: Certified Registered Nurse Anesthetist

## 2018-06-21 ENCOUNTER — Encounter (HOSPITAL_COMMUNITY): Payer: Self-pay | Admitting: *Deleted

## 2018-06-21 ENCOUNTER — Encounter (HOSPITAL_COMMUNITY)
Admission: RE | Disposition: A | Discharge status: Home / Self Care | Payer: Self-pay | Source: Home / Self Care | Attending: Internal Medicine

## 2018-06-21 ENCOUNTER — Other Ambulatory Visit: Payer: Self-pay

## 2018-06-21 ENCOUNTER — Ambulatory Visit (HOSPITAL_COMMUNITY)
Admission: RE | Admit: 2018-06-21 | Discharge: 2018-06-21 | Disposition: A | Discharge status: Home / Self Care | Payer: Managed Care, Other (non HMO) | Attending: Internal Medicine | Admitting: Internal Medicine

## 2018-06-21 DIAGNOSIS — Z9049 Acquired absence of other specified parts of digestive tract: Secondary | ICD-10-CM | POA: Insufficient documentation

## 2018-06-21 DIAGNOSIS — Z888 Allergy status to other drugs, medicaments and biological substances status: Secondary | ICD-10-CM | POA: Diagnosis not present

## 2018-06-21 DIAGNOSIS — Z87442 Personal history of urinary calculi: Secondary | ICD-10-CM | POA: Insufficient documentation

## 2018-06-21 DIAGNOSIS — K449 Diaphragmatic hernia without obstruction or gangrene: Secondary | ICD-10-CM | POA: Diagnosis not present

## 2018-06-21 DIAGNOSIS — Z6841 Body Mass Index (BMI) 40.0 and over, adult: Secondary | ICD-10-CM | POA: Diagnosis not present

## 2018-06-21 DIAGNOSIS — Z881 Allergy status to other antibiotic agents status: Secondary | ICD-10-CM | POA: Insufficient documentation

## 2018-06-21 DIAGNOSIS — F419 Anxiety disorder, unspecified: Secondary | ICD-10-CM | POA: Insufficient documentation

## 2018-06-21 DIAGNOSIS — G4730 Sleep apnea, unspecified: Secondary | ICD-10-CM | POA: Diagnosis not present

## 2018-06-21 DIAGNOSIS — Z8711 Personal history of peptic ulcer disease: Secondary | ICD-10-CM | POA: Insufficient documentation

## 2018-06-21 DIAGNOSIS — I10 Essential (primary) hypertension: Secondary | ICD-10-CM | POA: Diagnosis not present

## 2018-06-21 DIAGNOSIS — Z980 Intestinal bypass and anastomosis status: Secondary | ICD-10-CM | POA: Diagnosis not present

## 2018-06-21 DIAGNOSIS — K5090 Crohn's disease, unspecified, without complications: Secondary | ICD-10-CM | POA: Insufficient documentation

## 2018-06-21 DIAGNOSIS — Z79899 Other long term (current) drug therapy: Secondary | ICD-10-CM | POA: Diagnosis not present

## 2018-06-21 DIAGNOSIS — K50018 Crohn's disease of small intestine with other complication: Secondary | ICD-10-CM

## 2018-06-21 DIAGNOSIS — K2950 Unspecified chronic gastritis without bleeding: Secondary | ICD-10-CM | POA: Diagnosis not present

## 2018-06-21 DIAGNOSIS — Z825 Family history of asthma and other chronic lower respiratory diseases: Secondary | ICD-10-CM | POA: Insufficient documentation

## 2018-06-21 DIAGNOSIS — Z8371 Family history of colonic polyps: Secondary | ICD-10-CM | POA: Diagnosis not present

## 2018-06-21 DIAGNOSIS — K5000 Crohn's disease of small intestine without complications: Secondary | ICD-10-CM

## 2018-06-21 DIAGNOSIS — R1013 Epigastric pain: Secondary | ICD-10-CM | POA: Diagnosis not present

## 2018-06-21 DIAGNOSIS — Z8249 Family history of ischemic heart disease and other diseases of the circulatory system: Secondary | ICD-10-CM | POA: Diagnosis not present

## 2018-06-21 DIAGNOSIS — K219 Gastro-esophageal reflux disease without esophagitis: Secondary | ICD-10-CM | POA: Insufficient documentation

## 2018-06-21 HISTORY — PX: ESOPHAGOGASTRODUODENOSCOPY (EGD) WITH PROPOFOL: SHX5813

## 2018-06-21 HISTORY — PX: BIOPSY: SHX5522

## 2018-06-21 SURGERY — ESOPHAGOGASTRODUODENOSCOPY (EGD) WITH PROPOFOL
Anesthesia: Monitor Anesthesia Care

## 2018-06-21 MED ORDER — SODIUM CHLORIDE 0.9 % IV SOLN: INTRAVENOUS | Status: DC

## 2018-06-21 MED ORDER — PROPOFOL 500 MG/50ML IV EMUL
INTRAVENOUS | Status: DC | PRN
  Administered 2018-06-21: 100 ug/kg/min via INTRAVENOUS

## 2018-06-21 MED ORDER — LIDOCAINE 2% (20 MG/ML) 5 ML SYRINGE
INTRAMUSCULAR | Status: DC | PRN
  Administered 2018-06-21: 100 mg via INTRAVENOUS

## 2018-06-21 MED ORDER — ONDANSETRON HCL 4 MG/2ML IJ SOLN
INTRAMUSCULAR | Status: DC | PRN
  Administered 2018-06-21: 4 mg via INTRAVENOUS

## 2018-06-21 MED ORDER — PROPOFOL 10 MG/ML IV BOLUS
INTRAVENOUS | Status: AC
  Filled 2018-06-21: qty 60

## 2018-06-21 MED ORDER — LACTATED RINGERS IV SOLN
INTRAVENOUS | Status: DC
  Administered 2018-06-21: 1000 mL via INTRAVENOUS

## 2018-06-21 MED ORDER — PROPOFOL 10 MG/ML IV BOLUS
INTRAVENOUS | Status: DC | PRN
  Administered 2018-06-21 (×2): 30 mg via INTRAVENOUS
  Administered 2018-06-21 (×2): 40 mg via INTRAVENOUS

## 2018-06-21 SURGICAL SUPPLY — 14 items

## 2018-06-21 NOTE — Discharge Instructions (Signed)
° °  I took biopsies where the stomach and intestine are sewn together to see if there is any of the Crohn's present. No obvious signs.  I will let you know - I am away next week so might be after that.  I have some treatment ideas (written in the report) but need to wait on the biopsies.  I appreciate the opportunity to care for you. Gatha Mayer, MD, FACG  YOU HAD AN ENDOSCOPIC PROCEDURE TODAY: Refer to the procedure report and other information in the discharge instructions given to you for any specific questions about what was found during the examination. If this information does not answer your questions, please call Dr. Celesta Aver office at [phone] to clarify.   YOU SHOULD EXPECT: Some feelings of bloating in the abdomen. Passage of more gas than usual. Walking can help get rid of the air that was put into your GI tract during the procedure and reduce the bloating. If you had a lower endoscopy (such as a colonoscopy or flexible sigmoidoscopy) you may notice spotting of blood in your stool or on the toilet paper. Some abdominal soreness may be present for a day or two, also.  DIET: Your first meal following the procedure should be a light meal and then it is ok to progress to your normal diet. A half-sandwich or bowl of soup is an example of a good first meal. Heavy or fried foods are harder to digest and may make you feel nauseous or bloated. Drink plenty of fluids but you should avoid alcoholic beverages for 24 hours.   ACTIVITY: Your care partner should take you home directly after the procedure. You should plan to take it easy, moving slowly for the rest of the day. You can resume normal activity the day after the procedure however YOU SHOULD NOT DRIVE, use power tools, machinery or perform tasks that involve climbing or major physical exertion for 24 hours (because of the sedation medicines used during the test).   SYMPTOMS TO REPORT IMMEDIATELY: A gastroenterologist can be  reached at any hour. Please call [phone]  for any of the following symptoms:  Following lower endoscopy (colonoscopy, flexible sigmoidoscopy) Excessive amounts of blood in the stool  Significant tenderness, worsening of abdominal pains  Swelling of the abdomen that is new, acute  Fever of 100 or higher  Following upper endoscopy (EGD, EUS, ERCP, esophageal dilation) Vomiting of blood or coffee ground material  New, significant abdominal pain  New, significant chest pain or pain under the shoulder blades  Painful or persistently difficult swallowing  New shortness of breath  Black, tarry-looking or red, bloody stools  FOLLOW UP:  If any biopsies were taken you will be contacted by phone or by letter within the next 1-3 weeks. Call [phone]  if you have not heard about the biopsies in 3 weeks.  Please also call with any specific questions about appointments or follow up tests.

## 2018-06-21 NOTE — Anesthesia Postprocedure Evaluation (Signed)
Anesthesia Post Note  Patient: Craig Guzman.  Procedure(s) Performed: ESOPHAGOGASTRODUODENOSCOPY (EGD) WITH PROPOFOL (N/A ) BIOPSY     Patient location during evaluation: PACU Anesthesia Type: MAC Level of consciousness: awake and alert Pain management: pain level controlled Vital Signs Assessment: post-procedure vital signs reviewed and stable Respiratory status: spontaneous breathing, nonlabored ventilation, respiratory function stable and patient connected to nasal cannula oxygen Cardiovascular status: stable and blood pressure returned to baseline Postop Assessment: no apparent nausea or vomiting Anesthetic complications: no    Last Vitals:  Vitals:   06/21/18 0625 06/21/18 0805  BP: 138/76 138/70  Pulse: 95 94  Resp: 12 17  Temp: 36.6 C 36.6 C  SpO2: 99% 97%    Last Pain:  Vitals:   06/21/18 0805  TempSrc: Oral  PainSc: 0-No pain                 Emmeline Winebarger

## 2018-06-21 NOTE — Transfer of Care (Signed)
Immediate Anesthesia Transfer of Care Note  Patient: Craig Guzman.  Procedure(s) Performed: ESOPHAGOGASTRODUODENOSCOPY (EGD) WITH PROPOFOL (N/A ) BIOPSY  Patient Location: Endoscopy Unit  Anesthesia Type:MAC  Level of Consciousness: drowsy and patient cooperative  Airway & Oxygen Therapy: Patient Spontanous Breathing  Post-op Assessment: Report given to RN and Post -op Vital signs reviewed and stable  Post vital signs: Reviewed and stable  Last Vitals:  Vitals Value Taken Time  BP    Temp    Pulse 94 06/21/2018  8:04 AM  Resp 17 06/21/2018  8:04 AM  SpO2 97 % 06/21/2018  8:04 AM  Vitals shown include unvalidated device data.  Last Pain:  Vitals:   06/21/18 0625  TempSrc: Oral  PainSc: 0-No pain         Complications: No apparent anesthesia complications

## 2018-06-21 NOTE — Op Note (Signed)
[facility] Patient Name: Craig Guzman Procedure Date: 06/21/2018 MRN: [id number] Attending MD: Gatha Mayer , MD Date of Birth: September 30, 1970 CSN: [id number] Age: 47 Admit Type: Outpatient Procedure:                Upper GI endoscopy Indications:              Epigastric abdominal pain, Crohn's disease Providers:                Gatha Mayer, MD, Debi Mays, RN, Cherylynn Ridges,                            Technician, Cathe Mons, CRNA Referring MD:              Medicines:                Propofol per Anesthesia, Monitored Anesthesia Care Complications:            No immediate complications. Estimated Blood Loss:     Estimated blood loss was minimal. Procedure:                Pre-Anesthesia Assessment:                           - Prior to the procedure, a History and Physical                            was performed, and patient medications and                            allergies were reviewed. The patient's tolerance of                            previous anesthesia was also reviewed. The risks                            and benefits of the procedure and the sedation                            options and risks were discussed with the patient.                            All questions were answered, and informed consent                            was obtained. Prior Anticoagulants: The patient has                            taken no previous anticoagulant or antiplatelet                            agents. ASA Grade Assessment: III - A patient with                            severe systemic disease. After reviewing the risks  and benefits, the patient was deemed in                            satisfactory condition to undergo the procedure.                           After obtaining informed consent, the endoscope was                            passed under direct vision. Throughout the                            procedure, the patient's blood  pressure, pulse, and                            oxygen saturations were monitored continuously. The                            GIF-H190 ([id number]) Olympus adult endoscope was                            introduced through the mouth, and advanced to the                            efferent jejunal loop. The upper GI endoscopy was                            accomplished without difficulty. The patient                            tolerated the procedure well. Scope In: Scope Out: Findings:      Evidence of a gastrojejunostomy was found in the gastric body. This was       characterized by erythema and friable mucosa. Biopsies were taken with a       cold forceps for histology. Verification of patient identification for       the specimen was done. Estimated blood loss was minimal.      The exam was otherwise without abnormality.      The cardia and gastric fundus were normal on retroflexion. Impression:               - A gastrojejunostomy was found, characterized by                            friable mucosa and erythema. Biopsied. Changes were                            mild and suspect ity is normal/healthy post-op                            appearance                           - The examination was otherwise normal. Moderate Sedation:      Not Applicable - Patient had care per Anesthesia. Recommendation:           -  Patient has a contact number available for                            emergencies. The signs and symptoms of potential                            delayed complications were discussed with the                            patient. Return to normal activities tomorrow.                            Written discharge instructions were provided to the                            patient.                           - Dumping.                           - Continue present medications.                           - Await pathology results.                           - Consider neuromodulator Tx -  Duloxetine vs TCA                           Is on metaclopramide so may need to chnage that if                            use TCA                           He has neuropathic sxs in upper abdomen                            (parasthesia-like) in addition to the post-prandial                            epigastric pain. Procedure Code(s):        --- Professional ---                           [phone], Esophagogastroduodenoscopy, flexible,                            transoral; with biopsy, single or multiple Diagnosis Code(s):        --- Professional ---                           Z98.0, Intestinal bypass and anastomosis status                           R10.13, Epigastric pain  K50.90, Crohn's disease, unspecified, without                            complications CPT copyright 2018 [employer]. All rights reserved. The codes documented in this report are preliminary and upon coder review may  be revised to meet current compliance requirements. Gatha Mayer, MD 06/21/2018 8:17:50 AM This report has been signed electronically. Number of Addenda: 0

## 2018-06-21 NOTE — Interval H&P Note (Signed)
History and Physical Interval Note:  06/21/2018 7:39 AM  Craig Guzman.  has presented today for surgery, with the diagnosis of abdominal pain, epigastric  The various methods of treatment have been discussed with the patient and family. After consideration of risks, benefits and other options for treatment, the patient has consented to  Procedure(s): ESOPHAGOGASTRODUODENOSCOPY (EGD) WITH PROPOFOL (N/A) as a surgical intervention .  The patient's history has been reviewed, patient examined, no change in status, stable for surgery.  I have reviewed the patient's chart and labs.  Questions were answered to the patient's satisfaction.     Silvano Rusk

## 2018-06-24 ENCOUNTER — Encounter (HOSPITAL_COMMUNITY): Payer: Self-pay | Admitting: Internal Medicine

## 2018-06-24 ENCOUNTER — Ambulatory Visit (INDEPENDENT_AMBULATORY_CARE_PROVIDER_SITE_OTHER): Payer: Managed Care, Other (non HMO) | Admitting: Otolaryngology

## 2018-06-24 DIAGNOSIS — J342 Deviated nasal septum: Secondary | ICD-10-CM

## 2018-06-24 DIAGNOSIS — J343 Hypertrophy of nasal turbinates: Secondary | ICD-10-CM | POA: Diagnosis not present

## 2018-06-24 DIAGNOSIS — J310 Chronic rhinitis: Secondary | ICD-10-CM | POA: Diagnosis not present

## 2018-07-02 ENCOUNTER — Other Ambulatory Visit: Payer: Self-pay | Admitting: Otolaryngology

## 2018-07-02 DIAGNOSIS — J329 Chronic sinusitis, unspecified: Secondary | ICD-10-CM

## 2018-07-07 ENCOUNTER — Other Ambulatory Visit: Payer: Self-pay | Admitting: Family Medicine

## 2018-07-07 DIAGNOSIS — I10 Essential (primary) hypertension: Secondary | ICD-10-CM

## 2018-07-08 ENCOUNTER — Other Ambulatory Visit: Payer: Self-pay

## 2018-07-08 ENCOUNTER — Other Ambulatory Visit: Payer: Self-pay | Admitting: Internal Medicine

## 2018-07-08 ENCOUNTER — Ambulatory Visit
Admission: RE | Admit: 2018-07-08 | Discharge: 2018-07-08 | Disposition: A | Discharge status: Home / Self Care | Payer: Managed Care, Other (non HMO) | Source: Ambulatory Visit | Attending: Otolaryngology | Admitting: Otolaryngology

## 2018-07-08 DIAGNOSIS — J329 Chronic sinusitis, unspecified: Secondary | ICD-10-CM

## 2018-07-08 MED ORDER — DICYCLOMINE HCL 20 MG PO TABS: 20.0000 mg | ORAL_TABLET | Freq: Three times a day (TID) | ORAL | 1 refills | Status: DC

## 2018-07-08 NOTE — Progress Notes (Signed)
I sent your prescription to our pharmacy. Please call and schedule a follow up with [name] in Feb

## 2018-07-08 NOTE — Progress Notes (Signed)
Results reviewed with Henrene Pastor by phone  He needs:  Dicyclomine 20 mg qac # 90 1 refill  See me in mid-late feb please

## 2018-08-02 ENCOUNTER — Other Ambulatory Visit: Payer: Self-pay | Admitting: Family Medicine

## 2018-08-02 DIAGNOSIS — I10 Essential (primary) hypertension: Secondary | ICD-10-CM

## 2018-08-02 MED ORDER — VERAPAMIL HCL ER 180 MG PO TBCR: 360.0000 mg | EXTENDED_RELEASE_TABLET | Freq: Every day | ORAL | 1 refills | Status: DC

## 2018-08-02 NOTE — Telephone Encounter (Signed)
Requested medication (s) are due for refill today: yes  Requested medication (s) are on the active medication list: yes  Last refill:  11/08/17  Future visit scheduled: no  Notes to clinic:  Off protocol    Requested Prescriptions  Pending Prescriptions Disp Refills   verapamil (CALAN-SR) 180 MG CR tablet 180 tablet 1    Sig: Take 2 tablets (360 mg total) by mouth daily.     Off-Protocol Failed - 08/02/2018 11:52 AM      Failed - Medication not assigned to a protocol, review manually.      Passed - Valid encounter within last 12 months    Recent Outpatient Visits          1 month ago Acute upper respiratory infection   [facility], Ranell Patrick, MD   2 months ago Choking sensation   [facility], Ranell Patrick, MD   3 months ago Palpitations   [facility], Lilia Argue, MD   3 months ago Dry mouth   [facility], Ranell Patrick, MD   3 months ago Lumbar pain   [facility], Ines Bloomer, MD

## 2018-08-02 NOTE — Telephone Encounter (Signed)
Refilled. Discussed HTN briefly in December.

## 2018-08-02 NOTE — Telephone Encounter (Signed)
Copied from [place] [phone]. Topic: Quick Communication - Rx Refill/Question >> Aug 02, 2018 11:37 AM Alfredia Ferguson R wrote: Medication:verapamil (CALAN-SR) 180 MG CR tablet     Has the patient contacted their pharmacy?Yes  Preferred Pharmacy (with phone number or street name): [id number] [id number](Phone) [phone](Fax)    Agent: Please be advised that RX refills may take up to 3 business days. We ask that you follow-up with your pharmacy.

## 2018-08-07 ENCOUNTER — Ambulatory Visit (INDEPENDENT_AMBULATORY_CARE_PROVIDER_SITE_OTHER): Payer: Managed Care, Other (non HMO) | Admitting: Internal Medicine

## 2018-08-07 ENCOUNTER — Encounter: Payer: Self-pay | Admitting: Internal Medicine

## 2018-08-07 ENCOUNTER — Telehealth: Payer: Self-pay

## 2018-08-07 ENCOUNTER — Other Ambulatory Visit (INDEPENDENT_AMBULATORY_CARE_PROVIDER_SITE_OTHER): Payer: Managed Care, Other (non HMO)

## 2018-08-07 VITALS — BP 132/110 | HR 80 | Ht 72.0 in | Wt 362.0 lb

## 2018-08-07 DIAGNOSIS — R112 Nausea with vomiting, unspecified: Secondary | ICD-10-CM

## 2018-08-07 DIAGNOSIS — R10815 Periumbilic abdominal tenderness: Secondary | ICD-10-CM

## 2018-08-07 DIAGNOSIS — K50018 Crohn's disease of small intestine with other complication: Secondary | ICD-10-CM

## 2018-08-07 DIAGNOSIS — R194 Change in bowel habit: Secondary | ICD-10-CM | POA: Diagnosis not present

## 2018-08-07 LAB — CBC WITH DIFFERENTIAL/PLATELET
BASOS ABS: 0 10*3/uL (ref 0.0–0.1)
Basophils Relative: 0.5 % (ref 0.0–3.0)
Eosinophils Absolute: 0.2 10*3/uL (ref 0.0–0.7)
Eosinophils Relative: 4.9 % (ref 0.0–5.0)
HCT: 44.4 % (ref 39.0–52.0)
Hemoglobin: 14.9 g/dL (ref 13.0–17.0)
Lymphocytes Relative: 37.4 % (ref 12.0–46.0)
Lymphs Abs: 1.8 10*3/uL (ref 0.7–4.0)
MCHC: 33.6 g/dL (ref 30.0–36.0)
MCV: 82 fl (ref 78.0–100.0)
Monocytes Absolute: 0.8 10*3/uL (ref 0.1–1.0)
Monocytes Relative: 15.8 % — ABNORMAL HIGH (ref 3.0–12.0)
Neutro Abs: 2 10*3/uL (ref 1.4–7.7)
Neutrophils Relative %: 41.4 % — ABNORMAL LOW (ref 43.0–77.0)
Platelets: 225 10*3/uL (ref 150.0–400.0)
RBC: 5.42 Mil/uL (ref 4.22–5.81)
RDW: 14.5 % (ref 11.5–15.5)
WBC: 4.8 10*3/uL (ref 4.0–10.5)

## 2018-08-07 LAB — COMPREHENSIVE METABOLIC PANEL
ALT: 24 U/L (ref 0–53)
AST: 23 U/L (ref 0–37)
Albumin: 4.1 g/dL (ref 3.5–5.2)
Alkaline Phosphatase: 117 U/L (ref 39–117)
BUN: 11 mg/dL (ref 6–23)
CO2: 25 mEq/L (ref 19–32)
Calcium: 9.1 mg/dL (ref 8.4–10.5)
Chloride: 105 mEq/L (ref 96–112)
Creatinine, Ser: 1.05 mg/dL (ref 0.40–1.50)
GFR: 91.77 mL/min (ref >60.00)
Glucose, Bld: 101 mg/dL — ABNORMAL HIGH (ref 70–99)
Potassium: 4.1 mEq/L (ref 3.5–5.1)
Sodium: 141 mEq/L (ref 135–145)
Total Bilirubin: 0.4 mg/dL (ref 0.2–1.2)
Total Protein: 7.4 g/dL (ref 6.0–8.3)

## 2018-08-07 LAB — LIPASE: Lipase: 13 U/L (ref 11.0–59.0)

## 2018-08-07 MED ORDER — PROCHLORPERAZINE 25 MG RE SUPP: 25.0000 mg | Freq: Two times a day (BID) | RECTAL | 1 refills | Status: DC | PRN

## 2018-08-07 NOTE — Telephone Encounter (Signed)
Craig Guzman is also a patient of Dr Purvis Sheffield and they wanted a CT also. He was seen today by Dr Carlean Purl and he is aware of what Dr Gloriann Loan needs so we have ordered a CT abdomin and pelvis with and without contrast and delayed imaging for hematuria protocol. This will get the imaging we need for his abdominal pain , tenderness, crohn's, and nausea. I spoke with his wife Craig Guzman and told her per Marzetta Board at the CT center he is to arrive at 2:30pm tomorrow 08/08/2018 and drink the water contrast there. He will return the oral contrast to them tomorrow. Craig Guzman said she will tell Craig Guzman. Stacy changed the order in the system for me and Ez precerted it and got approval.

## 2018-08-07 NOTE — Patient Instructions (Addendum)
  Your provider has requested that you go to the basement level for lab work before leaving today. Press "B" on the elevator. The lab is located at the first door on the left as you exit the elevator.   We have sent the following medications to your pharmacy for you to pick up at your convenience: Compazine    You have been scheduled for a CT scan of the abdomen and pelvis at [facility] ([address]---this is in the same building as [facility]).   You are scheduled on 08/08/2018 at 3:00pm. You should arrive 15 minutes prior to your appointment time for registration. Please follow the written instructions below on the day of your exam:  WARNING: IF YOU ARE ALLERGIC TO IODINE/X-RAY DYE, PLEASE NOTIFY RADIOLOGY IMMEDIATELY AT [phone]! YOU WILL BE GIVEN A 13 HOUR PREMEDICATION PREP.  1) Do not eat or drink anything after 11:00AM (4 hours prior to your test) 2) You have been given 2 bottles of oral contrast to drink. The solution may taste better if refrigerated, but do NOT add ice or any other liquid to this solution. Shake well before drinking.    Drink 1 bottle of contrast @ 1:00pm (2 hours prior to your exam)  Drink 1 bottle of contrast @ 2:00pm (1 hour prior to your exam)  You may take any medications as prescribed with a small amount of water, if necessary. If you take any of the following medications: METFORMIN, GLUCOPHAGE, GLUCOVANCE, AVANDAMET, RIOMET, FORTAMET, [place] MET, JANUMET, GLUMETZA or METAGLIP, you MAY be asked to HOLD this medication 48 hours AFTER the exam.  The purpose of you drinking the oral contrast is to aid in the visualization of your intestinal tract. The contrast solution may cause some diarrhea. Depending on your individual set of symptoms, you may also receive an intravenous injection of x-ray contrast/dye. Plan on being at [facility] for 30 minutes or longer, depending on the type of exam you are having performed.  This test  typically takes 30-45 minutes to complete.  If you have any questions regarding your exam or if you need to reschedule, you may call the CT department at [phone] between the hours of 8:00 am and 5:00 pm, Monday-Friday.  We will let Dr Gloriann Loan know that we are ordering the CT scan.  We are giving you diet information today on dumping syndrome.   I appreciate the opportunity to care for you. Silvano Rusk, MD, [facility]     ________________________________________________________________________

## 2018-08-07 NOTE — Assessment & Plan Note (Addendum)
?   If symptoms and signs from this vs dumping or other issue Formed stools in beginning goes against infection Med side effects? I would suspect olmesartan diarrhea to be watery all the time. Question if he is having problems with Stelara or 6-MP.  He seemed to be doing pretty well after his surgery, and these problems have ensued after we restarted his Stelara. His next Curlene Labrum is due in March he says.  We may need to hold that.  Some of his symptoms certainly could be side effects of Stelara.  I may need tertiary referral help.  Will check labs, I do not think stool studies makes sense because he has some formed stools Check CT abdomen and pelvis w/ contrast given that he is status post surgery he has Crohn's disease and periumbilical tenderness, question if his small bowel or colon are now more involved with his Crohn's disease causing problems as that is certainly possible as well  He needs another copy of a dumping syndrome diet, so we provided that today.  This seems to be more than dumping syndrome to me though he has multiple comorbidities that make this confusing.  He is also somewhat hypertensive today and out of 1 of his medications.  That is apparently going to be filled today. Compazine suppositories prescribed.

## 2018-08-07 NOTE — Progress Notes (Signed)
Craig Guzman 48 y.o. [birth year] [id number]  Assessment & Plan:   Encounter Diagnoses  Name Primary?  . Gastroduodenal Crohn's disease with other complication ([place]) Yes  . Periumbilical abdominal tenderness without rebound tenderness   . Change in bowel habits   . Nausea and vomiting in adult    Gastroduodenal Crohn's disease ([place]) ? If symptoms and signs from this vs dumping or other issue Formed stools in beginning goes against infection Med side effects? I would suspect olmesartan diarrhea to be watery all the time. Question if he is having problems with Stelara or 6-MP.  He seemed to be doing pretty well after his surgery, and these problems have ensued after we restarted his Stelara. His next Curlene Labrum is due in March he says.  We may need to hold that.  Some of his symptoms certainly could be side effects of Stelara.  I may need tertiary referral help.  Will check labs, I do not think stool studies makes sense because he has some formed stools Check CT abdomen and pelvis w/ contrast given that he is status post surgery he has Crohn's disease and periumbilical tenderness, question if his small bowel or colon are now more involved with his Crohn's disease causing problems as that is certainly possible as well  He needs another copy of a dumping syndrome diet, so we provided that today.  This seems to be more than dumping syndrome to me though he has multiple comorbidities that make this confusing.  He is also somewhat hypertensive today and out of 1 of his medications.  That is apparently going to be filled today. Compazine suppositories prescribed.      IR:JJOACZ, Ranell Patrick, MD Dr. Gloriann Loan [facility] Dr. Benjamine Mola - ENT  Subjective:   Chief Complaint: Follow-up of Crohn's disease, not feeling well  HPI Craig Guzman is here with his wife today, he has not felt well for several weeks.  The symptoms and signs are a problems with diarrhea nausea and some vomiting.  He says that  his first stool of the day is formed and then it gets progressively looser.  Dicyclomine is not helping.  Ondansetron helps some with the nausea and some vomiting.  He is asking for Compazine suppositories as well.  He has a mild headache today, he has been out of 1 of his blood pressure medicines and his blood pressure is elevated on the diastolic side today.  He does not have any visual disturbances chest pain or arm pain.  He says the headache is just mild.  He is a bit nauseous, he did not take ondansetron at home as he was not nauseous until he got out to come to the office.  No fevers, somewhat chilly at times.  He took a Cipro last week when he had a cystoscopy from Dr. Gloriann Loan at [facility]rology but he does not recall being on any antibiotics otherwise lately.  Again he does have some formed stools.I did an EGD in December and it looked okay though biopsies of the anastomotic site status post antral resection showed granulomatous changes.  He remains on Stelara.   Other problems have been fatigue and weakness.  Apparently he passed a kidney stone 3 weeks ago though a CT study looking for stones in November was negative for that.   He has chronic sinusitis issues and surgery is planned for March 31.  He also has an upcoming CT scan plan from urology they do not know the date, to investigate hematuria  I believe.  He was placed on some medication to try to help reduce nocturia and he got very dry with cracked lips etc. so he stopped that.  Benicar was started a few months ago.  He is also had some problems with angioedema in the fall he also had problems with possible angioedema in the fall. Allergies  Allergen Reactions  . Lisinopril Swelling  . Zithromax [Azithromycin] Hypertension   Current Meds  Medication Sig  . azelastine (ASTELIN) 0.1 % nasal spray PLACE 2 SPRAYS INTO BOTH NOSTRILS 2 (TWO) TIMES DAILY. USE IN EACH NOSTRIL AS DIRECTED (Patient taking differently: Place 2 sprays into both  nostrils at bedtime. )  . cetirizine (ZYRTEC) 10 MG tablet Take 1 tablet (10 mg total) by mouth daily as needed for allergies. (Patient taking differently: Take 10 mg by mouth at bedtime. )  . cyclobenzaprine (FLEXERIL) 10 MG tablet Take 10 mg by mouth 3 (three) times daily as needed (migraines).   Marland Kitchen dicyclomine (BENTYL) 20 MG tablet Take 1 tablet (20 mg total) by mouth 3 (three) times daily before meals.  . doxazosin (CARDURA) 8 MG tablet Take 1 tablet (8 mg total) by mouth daily. (Patient taking differently: Take 8 mg by mouth every evening. )  . fluticasone (FLONASE) 50 MCG/ACT nasal spray Place 1 spray into both nostrils daily.  . hydrALAZINE (APRESOLINE) 50 MG tablet TAKE 1 TABLET (50 MG TOTAL) BY MOUTH EVERY 8 (EIGHT) HOURS.  Marland Kitchen mercaptopurine (PURINETHOL) 50 MG tablet Take 1 tablet (50 mg total) by mouth daily. Give on an empty stomach 1 hour before or 2 hours after meals. (Patient taking differently: Take 50 mg by mouth every evening. Give on an empty stomach 1 hour before or 2 hours after meals.)  . olmesartan (BENICAR) 20 MG tablet Take 1 tablet (20 mg total) by mouth daily.  . ondansetron (ZOFRAN-ODT) 4 MG disintegrating tablet TAKE 1 TABLET BY MOUTH EVERY 8 HOURS AS NEEDED FOR NAUSEA AND VOMITING (Patient taking differently: Take 4 mg by mouth every 8 (eight) hours as needed for nausea or vomiting. )  . rizatriptan (MAXALT-MLT) 10 MG disintegrating tablet TAKE 1 TABLET (10 MG TOTAL) BY MOUTH 3 (THREE) TIMES DAILY AS NEEDED FOR MIGRAINE.  Marland Kitchen ustekinumab (STELARA) 90 MG/ML SOSY injection Inject 1 mL (90 mg total) into the skin every 8 (eight) weeks.  . verapamil (CALAN-SR) 180 MG CR tablet Take 2 tablets (360 mg total) by mouth daily.  . [DISCONTINUED] omeprazole-sodium bicarbonate (ZEGERID) 40-1100 MG capsule TAKE 1 CAPSULE BY MOUTH AT BEDTIME. (Patient taking differently: Take 1 capsule by mouth 2 (two) times daily as needed (for acid reflux/indigestion.). )   Past Medical History:    Diagnosis Date  . Allergy    trees/ grasses/ animals/dust/mold  . Anxiety   . Biliary dyskinesia   . Colloid thyroid nodule   . Crohn's disease ([place])    Stomach, terminal ileum, cecum  . Gastric ulcer    antral  . GERD (gastroesophageal reflux disease)   . History of migraine headaches   . HTN (hypertension)   . Kidney stone 09/20/2012  . Migraine   . Obesity   . Pneumonia 02/27/2014   ED  . Sleep apnea    has cpap- does not wear    Past Surgical History:  Procedure Laterality Date  . BALLOON DILATION N/A 12/06/2017   Procedure: BALLOON DILATION;  Surgeon: Gatha Mayer, MD;  Location: [facility];  Service: Endoscopy;  Laterality: N/A;  . BIOPSY  06/21/2018  Procedure: BIOPSY;  Surgeon: Gatha Mayer, MD;  Location: [facility];  Service: Endoscopy;;  . CHOLECYSTECTOMY  2011   [place]  . COLONOSCOPY  07/26/11   Crohn's colitis, ileitis  . ESOPHAGOGASTRODUODENOSCOPY  05/04/11, 07/26/11   granulomatous gastritis - Crohn's  . ESOPHAGOGASTRODUODENOSCOPY (EGD) WITH PROPOFOL N/A 03/28/2017   Procedure: ESOPHAGOGASTRODUODENOSCOPY (EGD) WITH PROPOFOL  with balloon dilation of duodenum;  Surgeon: [name], MD;  Location: [facility];  Service: Gastroenterology;  Laterality: N/A;  . ESOPHAGOGASTRODUODENOSCOPY (EGD) WITH PROPOFOL N/A 12/06/2017   Procedure: ESOPHAGOGASTRODUODENOSCOPY (EGD) WITH PROPOFOL;  Surgeon: Gatha Mayer, MD;  Location: [facility];  Service: Endoscopy;  Laterality: N/A;  . ESOPHAGOGASTRODUODENOSCOPY (EGD) WITH PROPOFOL N/A 06/21/2018   Procedure: ESOPHAGOGASTRODUODENOSCOPY (EGD) WITH PROPOFOL;  Surgeon: Gatha Mayer, MD;  Location: [facility];  Service: Endoscopy;  Laterality: N/A;  . FLEXIBLE SIGMOIDOSCOPY    . GASTROJEJUNOSTOMY  01/2018  . MULTIPLE TOOTH EXTRACTIONS  2005  . SHOULDER SURGERY     right  . UPPER GASTROINTESTINAL ENDOSCOPY    . VASECTOMY     bilateral w/lysis of penile adhesions   Social History   Social History  Narrative   Married. Education: [employer].    Lives at home w/ his wife and kids   Right-handed   Caffeine: 3 sodas per day   family history includes Asthma in his mother; Colon polyps in his father; Diabetes in his father; Heart disease in his maternal grandmother; Hypertension in his father and mother.   Review of Systems As per HPI  Objective:   Physical Exam BP (!) 132/110   Pulse 80   Ht 6' (1.829 m)   Wt (!) 362 lb (164.2 kg)   BMI 49.10 kg/m   Mildly ill, can tell he does not feel well Anicteric Mouth clear, moist Lungs clear Cor NL S1S2 abd obese and mildly tender periumbilical, BS +  Data reviewed: Primary care notes in the last few months, imaging, my recent EGD, [facility] notes from late 2019 labs in the computer

## 2018-08-08 ENCOUNTER — Ambulatory Visit (INDEPENDENT_AMBULATORY_CARE_PROVIDER_SITE_OTHER)
Admission: RE | Admit: 2018-08-08 | Discharge: 2018-08-08 | Disposition: A | Discharge status: Home / Self Care | Payer: Managed Care, Other (non HMO) | Source: Ambulatory Visit | Attending: Internal Medicine | Admitting: Internal Medicine

## 2018-08-08 DIAGNOSIS — K50018 Crohn's disease of small intestine with other complication: Secondary | ICD-10-CM

## 2018-08-08 DIAGNOSIS — R112 Nausea with vomiting, unspecified: Secondary | ICD-10-CM

## 2018-08-08 DIAGNOSIS — R10815 Periumbilic abdominal tenderness: Secondary | ICD-10-CM | POA: Diagnosis not present

## 2018-08-08 MED ORDER — IOPAMIDOL (ISOVUE-300) INJECTION 61%
125.0000 mL | Freq: Once | INTRAVENOUS | Status: AC | PRN
  Administered 2018-08-08: 125 mL via INTRAVENOUS

## 2018-08-09 ENCOUNTER — Ambulatory Visit (INDEPENDENT_AMBULATORY_CARE_PROVIDER_SITE_OTHER): Payer: Managed Care, Other (non HMO) | Admitting: Family Medicine

## 2018-08-09 ENCOUNTER — Other Ambulatory Visit: Payer: Self-pay

## 2018-08-09 ENCOUNTER — Encounter: Payer: Self-pay | Admitting: Family Medicine

## 2018-08-09 VITALS — BP 144/85 | HR 90 | Temp 98.4°F | Ht 72.0 in | Wt 361.2 lb

## 2018-08-09 DIAGNOSIS — R0981 Nasal congestion: Secondary | ICD-10-CM

## 2018-08-09 DIAGNOSIS — J310 Chronic rhinitis: Secondary | ICD-10-CM

## 2018-08-09 DIAGNOSIS — H9203 Otalgia, bilateral: Secondary | ICD-10-CM

## 2018-08-09 MED ORDER — AMOXICILLIN-POT CLAVULANATE 875-125 MG PO TABS: 1.0000 | ORAL_TABLET | Freq: Two times a day (BID) | ORAL | 0 refills | Status: DC

## 2018-08-09 NOTE — Progress Notes (Signed)
Labs and CT scan are all ok  I am thinking some of Craig Guzman's problems may be medication side effects  Stop 6 MP Stop stelara F/U in  About 2 months - please schedule into slot available now Stay on dumping syndrome diet

## 2018-08-09 NOTE — Patient Instructions (Signed)
° ° ° °  If you have lab work done today you will be contacted with your lab results within the next 2 weeks.  If you have not heard from us then please contact us. The fastest way to get your results is to register for My Chart. ° ° °IF you received an x-ray today, you will receive an invoice from [facility]. Please contact [facility] at [phone] with questions or concerns regarding your invoice.  ° °IF you received labwork today, you will receive an invoice from [facility]. Please contact [facility] at [phone] with questions or concerns regarding your invoice.  ° °Our billing staff will not be able to assist you with questions regarding bills from these companies. ° °You will be contacted with the lab results as soon as they are available. The fastest way to get your results is to activate your My Chart account. Instructions are located on the last page of this paperwork. If you have not heard from us regarding the results in 2 weeks, please contact this office. °  ° ° ° °

## 2018-08-09 NOTE — Progress Notes (Signed)
Established Patient Office Visit  Subjective:  Patient ID: Craig Guzman, male    DOB: [birth year]  Age: 48 y.o. MRN: [id number]  CC:  Chief Complaint  Patient presents with  . Otalgia    both ears     HPI Craig Guzman presents for  He has surgery on 10/01/2018 for sinus obstruction and deviated septum He is having ear pain and sinus congestion He is using astelin spray and flonase The rain made his sinuses feel worse No fevers or chills He wanted to be checked for sinus infection so that his surgery would not be delayed He is a non-smoker He states that he has not been able to smell anything for 3-4 months and now he has a metallic smell for the past week He states he does a nose flush  Past Medical History:  Diagnosis Date  . Allergy    trees/ grasses/ animals/dust/mold  . Anxiety   . Biliary dyskinesia   . Colloid thyroid nodule   . Crohn's disease ([place])    Stomach, terminal ileum, cecum  . Gastric ulcer    antral  . GERD (gastroesophageal reflux disease)   . History of migraine headaches   . HTN (hypertension)   . Kidney stone 09/20/2012  . Migraine   . Obesity   . Pneumonia 02/27/2014   ED  . Sleep apnea    has cpap- does not wear     Past Surgical History:  Procedure Laterality Date  . BALLOON DILATION N/A 12/06/2017   Procedure: BALLOON DILATION;  Surgeon: Gatha Mayer, MD;  Location: [facility];  Service: Endoscopy;  Laterality: N/A;  . BIOPSY  06/21/2018   Procedure: BIOPSY;  Surgeon: Gatha Mayer, MD;  Location: [facility];  Service: Endoscopy;;  . CHOLECYSTECTOMY  2011   [facility]  . COLONOSCOPY  07/26/11   Crohn's colitis, ileitis  . ESOPHAGOGASTRODUODENOSCOPY  05/04/11, 07/26/11   granulomatous gastritis - Crohn's  . ESOPHAGOGASTRODUODENOSCOPY (EGD) WITH PROPOFOL N/A 03/28/2017   Procedure: ESOPHAGOGASTRODUODENOSCOPY (EGD) WITH PROPOFOL  with balloon dilation of duodenum;  Surgeon: [name], MD;  Location: [facility];   Service: Gastroenterology;  Laterality: N/A;  . ESOPHAGOGASTRODUODENOSCOPY (EGD) WITH PROPOFOL N/A 12/06/2017   Procedure: ESOPHAGOGASTRODUODENOSCOPY (EGD) WITH PROPOFOL;  Surgeon: Gatha Mayer, MD;  [facility];  Service: Endoscopy;  Laterality: N/A;  . ESOPHAGOGASTRODUODENOSCOPY (EGD) WITH PROPOFOL N/A 06/21/2018   Procedure: ESOPHAGOGASTRODUODENOSCOPY (EGD) WITH PROPOFOL;  Surgeon: Gatha Mayer, MD;  [facility];  Service: Endoscopy;  Laterality: N/A;  . FLEXIBLE SIGMOIDOSCOPY    . GASTROJEJUNOSTOMY  01/2018  . MULTIPLE TOOTH EXTRACTIONS  2005  . SHOULDER SURGERY     right  . UPPER GASTROINTESTINAL ENDOSCOPY    . VASECTOMY     bilateral w/lysis of penile adhesions    Family History  Problem Relation Age of Onset  . Colon polyps Father   . Diabetes Father   . Hypertension Father   . Hypertension Mother   . Asthma Mother   . Heart disease Maternal Grandmother   . Colon cancer Neg Hx   . Rectal cancer Neg Hx   . Stomach cancer Neg Hx   . Esophageal cancer Neg Hx     Social History   Socioeconomic History  . Marital status: Married    Spouse name: Not on file  . Number of children: 3  . Years of education: Some college  . Highest education level: Not on file  Occupational History  .  Occupation: [employer]  Social Needs  . Financial resource strain: Not on file  . Food insecurity:    Worry: Not on file    Inability: Not on file  . Transportation needs:    Medical: Not on file    Non-medical: Not on file  Tobacco Use  . Smoking status: Former Smoker    Packs/day: 0.75    Years: 21.00    Pack years: 15.75    Types: Cigarettes    Last attempt to quit: 12/01/2017    Years since quitting: 0.6  . Smokeless tobacco: Former Systems developer    Types: Snuff  . Tobacco comment: Counseling sheet given in exam room   Substance and Sexual Activity  . Alcohol use: Yes    Alcohol/week: 0.0 standard drinks    Comment: occ  . Drug use: No  . Sexual activity: Yes      Partners: Female  Lifestyle  . Physical activity:    Days per week: Not on file    Minutes per session: Not on file  . Stress: Not on file  Relationships  . Social connections:    Talks on phone: Not on file    Gets together: Not on file    Attends religious service: Not on file    Active member of club or organization: Not on file    Attends meetings of clubs or organizations: Not on file    Relationship status: Not on file  . Intimate partner violence:    Fear of current or ex partner: Not on file    Emotionally abused: Not on file    Physically abused: Not on file    Forced sexual activity: Not on file  Other Topics Concern  . Not on file  Social History Narrative   Married. Education: [employer].    Lives at home w/ his wife and kids   Right-handed   Caffeine: 3 sodas per day    Outpatient Medications Prior to Visit  Medication Sig Dispense Refill  . azelastine (ASTELIN) 0.1 % nasal spray PLACE 2 SPRAYS INTO BOTH NOSTRILS 2 (TWO) TIMES DAILY. USE IN EACH NOSTRIL AS DIRECTED (Patient taking differently: Place 2 sprays into both nostrils at bedtime. ) 30 mL 1  . cyclobenzaprine (FLEXERIL) 10 MG tablet Take 10 mg by mouth 3 (three) times daily as needed (migraines).     Marland Kitchen dicyclomine (BENTYL) 20 MG tablet Take 1 tablet (20 mg total) by mouth 3 (three) times daily before meals. 90 tablet 1  . doxazosin (CARDURA) 8 MG tablet Take 1 tablet (8 mg total) by mouth daily. (Patient taking differently: Take 8 mg by mouth every evening. ) 90 tablet 0  . fluticasone (FLONASE) 50 MCG/ACT nasal spray Place 1 spray into both nostrils daily. 16 g 2  . olmesartan (BENICAR) 20 MG tablet Take 1 tablet (20 mg total) by mouth daily. 30 tablet 1  . ondansetron (ZOFRAN-ODT) 4 MG disintegrating tablet TAKE 1 TABLET BY MOUTH EVERY 8 HOURS AS NEEDED FOR NAUSEA AND VOMITING (Patient taking differently: Take 4 mg by mouth every 8 (eight) hours as needed for nausea or vomiting. ) 30 tablet 5  .  prochlorperazine (COMPAZINE) 25 MG suppository Place 1 suppository (25 mg total) rectally every 12 (twelve) hours as needed for nausea or vomiting. 12 suppository 1  . rizatriptan (MAXALT-MLT) 10 MG disintegrating tablet TAKE 1 TABLET (10 MG TOTAL) BY MOUTH 3 (THREE) TIMES DAILY AS NEEDED FOR MIGRAINE. 9 tablet 3  . verapamil (CALAN-SR) 180  MG CR tablet Take 2 tablets (360 mg total) by mouth daily. 180 tablet 1  . cetirizine (ZYRTEC) 10 MG tablet Take 1 tablet (10 mg total) by mouth daily as needed for allergies. (Patient taking differently: Take 10 mg by mouth at bedtime. ) 30 tablet 1  . hydrALAZINE (APRESOLINE) 50 MG tablet TAKE 1 TABLET (50 MG TOTAL) BY MOUTH EVERY 8 (EIGHT) HOURS. 90 tablet 0   No facility-administered medications prior to visit.     Allergies  Allergen Reactions  . Lisinopril Swelling  . Zithromax [Azithromycin] Hypertension    ROS Review of Systems See hpi   Objective:    Physical Exam  BP (!) 144/85 (BP Location: Right Arm, Patient Position: Sitting, Cuff Size: Large)   Pulse 90   Temp 98.4 F (36.9 C) (Oral)   Ht 6' (1.829 m)   Wt (!) 361 lb 3.2 oz (163.8 kg)   SpO2 97%   BMI 48.99 kg/m  Wt Readings from Last 3 Encounters:  08/09/18 (!) 361 lb 3.2 oz (163.8 kg)  08/07/18 (!) 362 lb (164.2 kg)  06/21/18 (!) 350 lb (158.8 kg)   General: alert, oriented, in NAD Head: normocephalic, atraumatic, no sinus tenderness Eyes: EOM intact, no scleral icterus or conjunctival injection Ears: TM clear bilaterally Nose: mucosa erythematous, edematous Throat: no pharyngeal exudate or erythema Lymph: no posterior auricular, submental or cervical lymph adenopathy Heart: normal rate, normal sinus rhythm, no murmurs Lungs: clear to auscultation bilaterally, no wheezing    There are no preventive care reminders to display for this patient.  There are no preventive care reminders to display for this patient.  Lab Results  Component Value Date   TSH 1.850  04/26/2018   Lab Results  Component Value Date   WBC 4.8 08/07/2018   HGB 14.9 08/07/2018   HCT 44.4 08/07/2018   MCV 82.0 08/07/2018   PLT 225.0 08/07/2018   Lab Results  Component Value Date   NA 141 08/07/2018   K 4.1 08/07/2018   CO2 25 08/07/2018   GLUCOSE 101 (H) 08/07/2018   BUN 11 08/07/2018   CREATININE 1.05 08/07/2018   BILITOT 0.4 08/07/2018   ALKPHOS 117 08/07/2018   AST 23 08/07/2018   ALT 24 08/07/2018   PROT 7.4 08/07/2018   ALBUMIN 4.1 08/07/2018   CALCIUM 9.1 08/07/2018   ANIONGAP 4 (L) 05/15/2018   GFR 91.77 08/07/2018   Lab Results  Component Value Date   CHOL 160 01/18/2017   Lab Results  Component Value Date   HDL 34 (L) 01/18/2017   Lab Results  Component Value Date   LDLCALC 99 01/18/2017   Lab Results  Component Value Date   TRIG 133 01/18/2017   Lab Results  Component Value Date   CHOLHDL 4.7 01/18/2017   No results found for: HGBA1C    Assessment & Plan:   Problem List Items Addressed This Visit    None    Visit Diagnoses    Otalgia of both ears    -  Primary   Sinus congestion       Chronic rhinitis         -    Gave script for augmentin Discussed that if he develops sinusitis  (fevers, bloody discharge, facial pain, fatigue) To go ahead and fill  Pt verbalized understanding Reviewed allergies Reviewed previous history of antibiotic use  No orders of the defined types were placed in this encounter.   Follow-up: Return if symptoms worsen or fail to  improve.    Forrest Moron, MD

## 2018-08-23 ENCOUNTER — Other Ambulatory Visit: Payer: Self-pay | Admitting: Family Medicine

## 2018-08-23 DIAGNOSIS — I10 Essential (primary) hypertension: Secondary | ICD-10-CM

## 2018-08-23 NOTE — Telephone Encounter (Signed)
Requested Prescriptions  Pending Prescriptions Disp Refills  . hydrALAZINE (APRESOLINE) 50 MG tablet [Pharmacy Med Name: HYDRALAZINE 50 MG TABLET] 90 tablet 0    Sig: TAKE 1 TABLET (50 MG TOTAL) BY MOUTH EVERY 8 (EIGHT) HOURS.     Cardiovascular:  Vasodilators Failed - 08/23/2018 12:07 AM      Failed - Last BP in normal range    BP Readings from Last 1 Encounters:  08/09/18 (!) 144/85         Passed - HCT in normal range and within 360 days    HCT  Date Value Ref Range Status  08/07/2018 44.4 39.0 - 52.0 % Final         Passed - HGB in normal range and within 360 days    Hemoglobin  Date Value Ref Range Status  08/07/2018 14.9 13.0 - 17.0 g/dL Final         Passed - RBC in normal range and within 360 days    RBC  Date Value Ref Range Status  08/07/2018 5.42 4.22 - 5.81 Mil/uL Final         Passed - WBC in normal range and within 360 days    WBC  Date Value Ref Range Status  08/07/2018 4.8 4.0 - 10.5 K/uL Final         Passed - PLT in normal range and within 360 days    Platelets  Date Value Ref Range Status  08/07/2018 225.0 150.0 - 400.0 K/uL Final   Platelet Count, POC  Date Value Ref Range Status  05/21/2018 244 142 - 424 K/uL Final         Passed - Valid encounter within last 12 months    Recent Outpatient Visits          2 weeks ago Otalgia of both ears   [facility] at [facility], [place], [place]   2 months ago Acute upper respiratory infection   [facility] at [facility], Ranell Patrick, MD   3 months ago Choking sensation   [facility] at [facility], Ranell Patrick, MD   3 months ago Palpitations   [facility] at [facility], Lilia Argue, MD   4 months ago Dry mouth   [facility] at [facility], Ranell Patrick, MD

## 2018-09-02 ENCOUNTER — Encounter: Payer: Self-pay | Admitting: Adult Health

## 2018-09-10 ENCOUNTER — Other Ambulatory Visit: Payer: Self-pay | Admitting: Otolaryngology

## 2018-09-11 ENCOUNTER — Other Ambulatory Visit: Payer: Self-pay | Admitting: Family Medicine

## 2018-09-11 DIAGNOSIS — I10 Essential (primary) hypertension: Secondary | ICD-10-CM

## 2018-09-11 DIAGNOSIS — R351 Nocturia: Secondary | ICD-10-CM

## 2018-09-11 NOTE — Telephone Encounter (Signed)
Requested Prescriptions  Pending Prescriptions Disp Refills  . doxazosin (CARDURA) 8 MG tablet [Pharmacy Med Name: DOXAZOSIN MESYLATE 8 MG TAB] 90 tablet 1    Sig: TAKE 1 TABLET BY MOUTH EVERY DAY     Cardiovascular:  Alpha Blockers Failed - 09/11/2018 12:11 AM      Failed - Last BP in normal range    BP Readings from Last 1 Encounters:  08/09/18 (!) 144/85         Passed - Valid encounter within last 6 months    Recent Outpatient Visits          1 month ago Otalgia of both ears   Primary Care at [facility], [place], MD   3 months ago Acute upper respiratory infection   [facility] at [facility], Ranell Patrick, MD   3 months ago Choking sensation   [facility] at [facility], Ranell Patrick, MD   4 months ago Palpitations   [facility] at [facility], Lilia Argue, MD   4 months ago Dry mouth   [facility] at [facility], Ranell Patrick, MD

## 2018-09-24 ENCOUNTER — Encounter: Payer: Self-pay | Admitting: Internal Medicine

## 2018-09-24 ENCOUNTER — Other Ambulatory Visit: Payer: Self-pay

## 2018-09-24 ENCOUNTER — Telehealth (INDEPENDENT_AMBULATORY_CARE_PROVIDER_SITE_OTHER): Payer: Managed Care, Other (non HMO) | Admitting: Internal Medicine

## 2018-09-24 DIAGNOSIS — R197 Diarrhea, unspecified: Secondary | ICD-10-CM

## 2018-09-24 DIAGNOSIS — R112 Nausea with vomiting, unspecified: Secondary | ICD-10-CM

## 2018-09-24 DIAGNOSIS — K50018 Crohn's disease of small intestine with other complication: Secondary | ICD-10-CM

## 2018-09-24 DIAGNOSIS — R1013 Epigastric pain: Secondary | ICD-10-CM | POA: Diagnosis not present

## 2018-09-24 MED ORDER — DIPHENOXYLATE-ATROPINE 2.5-0.025 MG PO TABS: 1.0000 | ORAL_TABLET | Freq: Three times a day (TID) | ORAL | 0 refills | Status: DC

## 2018-09-24 NOTE — Patient Instructions (Signed)
As we discussed today I am stopping the dicyclomine and starting generic Lomotil which will come out as diphenoxylate and atropine.  Take 1 before meals.  In 1 to 2 weeks give me an update by my chart or a phone call as to whether or not this is helping.  I am sorry you are still suffering.  I am thinking this is most likely some sort of issue after your surgery or an irritable bowel type problem as opposed to the Crohn's disease though to be honest I am not sure.  It seems like your nausea and vomiting is better off the Stelara and 6-MP.  I appreciate the opportunity to care for you. Gatha Mayer, MD, [facility]

## 2018-09-24 NOTE — Assessment & Plan Note (Signed)
Persists w/o clear cause

## 2018-09-24 NOTE — Progress Notes (Signed)
TELEHEALTH ENCOUNTER IN SETTING OF COVID-19 PANDEMIC SERVICE PROVIDED BY TELEMEDECINE - TYPE: PHONE PATIENT LOCATION: HOME PATIENT HAS CONSENTED TO TELEHEALTH VISIT PROVIDER LOCATION: OFFICE PARTICIPANTS OTHER THAN PATIENT:WIFE TIME SPENT ON CALL:22 MINUTES    Craig Guzman 48 y.o. 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 If this is driving his problems vs post-op dysfunction vs both vs another new problem  I favor one or both of first 2 He did have granulomatous changes in his small bowel biopsies at his EGD in December 2019.  Those were microscopic and not macroscopic, the endoscopic appearance looked good postoperative.  Otherwise labs and imaging with CT scanning having failed to show problems.  His weight is stable to increased I think.  Objectively this does not sound like severe Crohn's disease but in that disease it is very hard to conclusively rule it out as an etiology.  Plans: Dumping diet, I have reminded him to use this. Dc dicyclomine Try Lomotil 1 tid ac  To notify me in 2 weeks ? If could benefit from amitriptyline He has not done well with steroids causing a rise in his blood pressure.  We talked about trying those but decided not to. He has been to [place] in the past, [facility].  I have considered another tertiary referral but the timing for that is terrible with the Covid 19 pandemic. Added pressure of job termination if not back to work in May.    Abdominal pain, epigastric Persists w/o clear cause  Diarrhea Post-prandial mostly - qod leading to weakness and fatigue Unchanged despite stopping Stelara and 6 MP Trial of Lomotil   Intractable vomiting with nausea Better since stopping Stelara and 6 MP      Subjective:   Chief Complaint: Diarrhea abdominal pain Crohn's disease  HPI This is a follow-up after early February visit where he was having nausea vomiting postprandial epigastric pain  and diarrhea.  See that note for further details, at that time I stopped his Stelara and 6-MP because I thought he might be having side effects from that.  Since that time he is having less nausea and vomiting though he still has it.  Otherwise he seems similar with postprandial epigastric pain and cramps and 5-6 bowel movements that are anywhere from solid to loose or watery though mostly loose or watery occurring every other day.  This wipes him out and makes him feel terribly weak and his wife says he cannot really function well on those days.  He is having fatigue and shortness of breath.  He says he has hot flashes.  He generally does not move his bowels much or at all on the other days, there are no constipation-like symptoms.  The symptoms are rarely if ever nocturnal.  He says his weight remains between 360 and 370.  About 3 episodes of vomiting in the past 6 weeks.  Much better than before.  His job is notify him that if he does not return by sometime in May he will be terminated and this is causing a fair amount of stress as he will lose his health insurance coverage.  He says he can check his blood pressure at home because the cuff is in the right size.  He so is not doing that.  Headaches or not causing a lot of problems right now. He has some chronic cough and sinus symptoms.  His sinus surgery that was scheduled was postponed due to  the pandemic.  See HPI Allergies  Allergen Reactions  . Lisinopril Swelling  . Zithromax [Azithromycin] Hypertension   Current Meds  Medication Sig  . cyclobenzaprine (FLEXERIL) 10 MG tablet Take 10 mg by mouth 3 (three) times daily as needed (migraines).   Marland Kitchen doxazosin (CARDURA) 8 MG tablet TAKE 1 TABLET BY MOUTH EVERY DAY  . olmesartan (BENICAR) 20 MG tablet Take 1 tablet (20 mg total) by mouth daily.  . ondansetron (ZOFRAN-ODT) 4 MG disintegrating tablet TAKE 1 TABLET BY MOUTH EVERY 8 HOURS AS NEEDED FOR NAUSEA AND VOMITING (Patient taking differently: Take  4 mg by mouth every 8 (eight) hours as needed for nausea or vomiting. )  . rizatriptan (MAXALT-MLT) 10 MG disintegrating tablet TAKE 1 TABLET (10 MG TOTAL) BY MOUTH 3 (THREE) TIMES DAILY AS NEEDED FOR MIGRAINE.  Marland Kitchen verapamil (CALAN-SR) 180 MG CR tablet Take 2 tablets (360 mg total) by mouth daily.  . [DISCONTINUED] dicyclomine (BENTYL) 20 MG tablet Take 1 tablet (20 mg total) by mouth 3 (three) times daily before meals.   Past Medical History:  Diagnosis Date  . Allergy    trees/ grasses/ animals/dust/mold  . Anxiety   . Biliary dyskinesia   . Colloid thyroid nodule   . Crohn's disease ([place])    Stomach, terminal ileum, cecum  . Gastric ulcer    antral  . GERD (gastroesophageal reflux disease)   . History of migraine headaches   . HTN (hypertension)   . Kidney stone 09/20/2012  . Migraine   . Obesity   . Pneumonia 02/27/2014   ED  . Sleep apnea    has cpap- does not wear    Past Surgical History:  Procedure Laterality Date  . BALLOON DILATION N/A 12/06/2017   Procedure: BALLOON DILATION;  Surgeon: Gatha Mayer, MD;  Location: [facility];  Service: Endoscopy;  Laterality: N/A;  . BIOPSY  06/21/2018   Procedure: BIOPSY;  Surgeon: Gatha Mayer, MD;  Location: [facility];  Service: Endoscopy;;  . CHOLECYSTECTOMY  2011   [facility]  . COLONOSCOPY  07/26/11   Crohn's colitis, ileitis  . ESOPHAGOGASTRODUODENOSCOPY  05/04/11, 07/26/11   granulomatous gastritis - Crohn's  . ESOPHAGOGASTRODUODENOSCOPY (EGD) WITH PROPOFOL N/A 03/28/2017   Procedure: ESOPHAGOGASTRODUODENOSCOPY (EGD) WITH PROPOFOL  with balloon dilation of duodenum;  Surgeon: [name], MD;  [facility]: [facility];  Service: Gastroenterology;  Laterality: N/A;  . ESOPHAGOGASTRODUODENOSCOPY (EGD) WITH PROPOFOL N/A 12/06/2017   Procedure: ESOPHAGOGASTRODUODENOSCOPY (EGD) WITH PROPOFOL;  Surgeon: Gatha Mayer, MD;  [facility];  Service: Endoscopy;  Laterality: N/A;  . ESOPHAGOGASTRODUODENOSCOPY  (EGD) WITH PROPOFOL N/A 06/21/2018   Procedure: ESOPHAGOGASTRODUODENOSCOPY (EGD) WITH PROPOFOL;  Surgeon: Gatha Mayer, MD;  [facility];  Service: Endoscopy;  Laterality: N/A;  . FLEXIBLE SIGMOIDOSCOPY    . GASTROJEJUNOSTOMY  01/2018  . MULTIPLE TOOTH EXTRACTIONS  2005  . SHOULDER SURGERY     right  . UPPER GASTROINTESTINAL ENDOSCOPY    . VASECTOMY     bilateral w/lysis of penile adhesions   Social History   Social History Narrative   Married. Education: [employer].    Lives at home w/ his wife and kids   Right-handed   Caffeine: 3 sodas per day   family history includes Asthma in his mother; Colon polyps in his father; Diabetes in his father; Heart disease in his maternal grandmother; Hypertension in his father and mother.   Review of Systems

## 2018-09-24 NOTE — Assessment & Plan Note (Signed)
Better since stopping Stelara and 6 MP

## 2018-09-24 NOTE — Assessment & Plan Note (Signed)
Post-prandial mostly - qod leading to weakness and fatigue Unchanged despite stopping Stelara and 6 MP Trial of Lomotil

## 2018-09-24 NOTE — Assessment & Plan Note (Addendum)
?   If this is driving his problems vs post-op dysfunction vs both vs another new problem  I favor one or both of first 2 He did have granulomatous changes in his small bowel biopsies at his EGD in December 2019.  Those were microscopic and not macroscopic, the endoscopic appearance looked good postoperative.  Otherwise labs and imaging with CT scanning having failed to show problems.  His weight is stable to increased I think.  Objectively this does not sound like severe Crohn's disease but in that disease it is very hard to conclusively rule it out as an etiology.  Plans: Dumping diet, I have reminded him to use this. Dc dicyclomine Try Lomotil 1 tid ac  To notify me in 2 weeks ? If could benefit from amitriptyline He has not done well with steroids causing a rise in his blood pressure.  We talked about trying those but decided not to. He has been to [place] in the past, [facility].  I have considered another tertiary referral but the timing for that is terrible with the Covid 19 pandemic. Added pressure of job termination if not back to work in May.

## 2018-10-14 ENCOUNTER — Ambulatory Visit: Payer: Managed Care, Other (non HMO) | Admitting: Adult Health

## 2018-10-18 ENCOUNTER — Other Ambulatory Visit: Payer: Self-pay | Admitting: Family Medicine

## 2018-10-18 DIAGNOSIS — I10 Essential (primary) hypertension: Secondary | ICD-10-CM

## 2018-11-06 ENCOUNTER — Other Ambulatory Visit: Payer: Self-pay | Admitting: Family Medicine

## 2018-11-06 DIAGNOSIS — I10 Essential (primary) hypertension: Secondary | ICD-10-CM

## 2018-11-06 MED ORDER — OLMESARTAN MEDOXOMIL 20 MG PO TABS: 20.0000 mg | ORAL_TABLET | Freq: Every day | ORAL | 1 refills | Status: DC

## 2018-11-08 ENCOUNTER — Telehealth: Payer: Self-pay | Admitting: Family Medicine

## 2018-11-08 NOTE — Telephone Encounter (Signed)
Copied from [place] [id number]. Topic: Quick Communication - Rx Refill/Question >> Nov 08, 2018  9:30 AM Craig Guzman wrote: Medication: hydrALAZINE (APRESOLINE) 50 MG tablet  Has the patient contacted their pharmacy? Yes.  Pt states the pharmacy will not just refill Guzman 30 day supply of medication. Please advise.  (Agent: If no, request that the patient contact the pharmacy for the refill.) (Agent: If yes, when and what did the pharmacy advise?)  Preferred Pharmacy (with phone number or street name): [id number] [place] [id number]: [phone]: [phone] 24 hours    Agent: Please be advised that RX refills may take up to 3 business days. We ask that you follow-up with your pharmacy.

## 2018-11-09 ENCOUNTER — Other Ambulatory Visit: Payer: Self-pay | Admitting: Neurology

## 2018-11-11 ENCOUNTER — Telehealth: Payer: Self-pay

## 2018-11-11 ENCOUNTER — Other Ambulatory Visit: Payer: Self-pay | Admitting: Urology

## 2018-11-11 ENCOUNTER — Other Ambulatory Visit: Payer: Self-pay | Admitting: Emergency Medicine

## 2018-11-11 DIAGNOSIS — I10 Essential (primary) hypertension: Secondary | ICD-10-CM

## 2018-11-11 MED ORDER — HYDRALAZINE HCL 50 MG PO TABS: 50.0000 mg | ORAL_TABLET | Freq: Three times a day (TID) | ORAL | 0 refills | Status: DC

## 2018-11-11 NOTE — Telephone Encounter (Signed)
Maxalt (rizatriptan) 10 mg refill has been denied. Appt is needed. Please offer virtual video visit with Jannifer Franklin or SS, NP.

## 2018-11-11 NOTE — Progress Notes (Signed)
No further refills without office visit 

## 2018-11-12 ENCOUNTER — Other Ambulatory Visit: Payer: Self-pay | Admitting: Urology

## 2018-11-13 ENCOUNTER — Telehealth (INDEPENDENT_AMBULATORY_CARE_PROVIDER_SITE_OTHER): Payer: Managed Care, Other (non HMO) | Admitting: Family Medicine

## 2018-11-13 ENCOUNTER — Other Ambulatory Visit: Payer: Self-pay

## 2018-11-13 DIAGNOSIS — I10 Essential (primary) hypertension: Secondary | ICD-10-CM | POA: Diagnosis not present

## 2018-11-13 DIAGNOSIS — R351 Nocturia: Secondary | ICD-10-CM

## 2018-11-13 MED ORDER — HYDRALAZINE HCL 50 MG PO TABS: 50.0000 mg | ORAL_TABLET | Freq: Three times a day (TID) | ORAL | 1 refills | Status: DC

## 2018-11-13 MED ORDER — DOXAZOSIN MESYLATE 8 MG PO TABS: 8.0000 mg | ORAL_TABLET | Freq: Every day | ORAL | 1 refills | Status: DC

## 2018-11-13 MED ORDER — VERAPAMIL HCL ER 180 MG PO TBCR: 360.0000 mg | EXTENDED_RELEASE_TABLET | Freq: Every day | ORAL | 1 refills | Status: DC

## 2018-11-13 NOTE — Progress Notes (Signed)
Chief Complaint : medication refills of Hydralazine hci (pending)  Pt states that he need a 90 day supply due to cost. Pt states he is doing well other wise

## 2018-11-13 NOTE — Patient Instructions (Signed)
Good talking to you today.  Overall blood pressures continue to be stable at this time.  No change in medicines, but when we follow-up in 3 months we can look at other potential options that may not require 3 times per day dosing.  Good luck on upcoming procedures and let me know if there are questions in the meantime.

## 2018-11-13 NOTE — Progress Notes (Signed)
Virtual Visit via Telephone Note  I connected with Craig Guzman on 11/13/18 at 2:31 PM by telephone and verified that I am speaking with the correct person using two identifiers.   I discussed the limitations, risks, security and privacy concerns of performing an evaluation and management service by telephone and the availability of in person appointments. I also discussed with the patient that there may be a patient responsible charge related to this service. The patient expressed understanding and agreed to proceed, consent obtained  Chief complaint:  HTN  History of Present Illness: Craig Guzman is a 48 y.o. male  Hypertension: BP Readings from Last 3 Encounters:  08/09/18 (!) 144/85  08/07/18 (!) 132/110  06/21/18 (!) 143/81    Lab Results  Component Value Date   CREATININE 1.05 08/07/2018  Currently on Benicar 20 mg daily, verapamil total 360 mg daily, Cardura 8 mg daily, hydralazine 50 mg 3 times daily.  No new side effects.   Home readings: 125-148/80-95.  Usually in 130's/90.  Constitutional: Negative for fatigue and unexpected weight change.  Eyes: Negative for visual disturbance.  Respiratory: Negative for new cough (only cough with PND, sinuses), chest tightness and shortness of breath.   Cardiovascular: Negative for chest pain, palpitations and leg swelling.  Gastrointestinal: Negative for new abdominal pain and blood in stool.  Neurological: Negative for dizziness, light-headedness and headaches.    Gastroenterology: History of Crohn's, history intestinal bypass, recurrent epigastric issues, vomiting. [name].  EGD December 2019. .A gastrojejunostomy was found, characterized by friable mucosa and erythema. Biopsied -granulomatous changes that were microscopic, not macroscopic. The examination was otherwise normal. Zofran and Compazine as needed, Lomotil 3 times daily started at last appointment March 24 with [name].  Vomiting/nausea improved  since stopping Stelara and 6-MP.  possible role of amitriptyline.  Deferring steroids given rise in blood pressure previously.  Has been evaluated with tertiary care centers with [place] previously. Has stopped taking meds - feels like diarrhea and constipation improving. No recent lomotil.  zofran once or twice per week only. Only a little pain in stimach every now and then. Plans to follow up with [name].   ENT: Planned nasal septoplasty and turbinate reduction by Dr. Benjamine Mola in June.  Previously had been scheduled in March, delayed due to pandemic.  Urology: Dr. Gloriann Loan.  Planned circumcision June 8.    Patient Active Problem List   Diagnosis Date Noted  . Abdominal pain, epigastric   . Angioedema 04/19/2018  . Intractable vomiting with nausea   . Cigarette nicotine dependence without complication 83/41/9622  . Shifting sleep-work schedule, affecting sleep 05/15/2016  . Sleep deprivation 05/15/2016  . Sleep related hypoventilation in conditions classified elsewhere 05/15/2016  . Sleep related headaches 05/15/2016  . Morbid obesity ([place]) 09/19/2013  . Diarrhea 07/10/2013  . 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 Hypertension 11/26/2012  . Gastroparesis??? 12/08/2011  . Common migraine without aura 12/08/2011  . Long-term use of immunosuppressant medication 08/08/2011  . 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 GERD (gastroesophageal reflux disease) 04/21/2011   Past Medical History:  Diagnosis Date  . Allergy    trees/ grasses/ animals/dust/mold  . Anxiety   . Biliary dyskinesia   . Colloid thyroid nodule   . Crohn's disease ([place])    Stomach, terminal ileum, cecum  . Gastric ulcer    antral  . GERD (gastroesophageal reflux disease)   . History of migraine headaches   . HTN (hypertension)   .  Kidney stone 09/20/2012  . Migraine   . Obesity   . Pneumonia 02/27/2014   ED  . Sleep apnea    has cpap- does not wear    Past Surgical History:  Procedure  Laterality Date  . BALLOON DILATION N/A 12/06/2017   Procedure: BALLOON DILATION;  Surgeon: Gatha Mayer, MD;  Location: [facility];  Service: Endoscopy;  Laterality: N/A;  . BIOPSY  06/21/2018   Procedure: BIOPSY;  Surgeon: Gatha Mayer, MD;  Location: [facility];  Service: Endoscopy;;  . CHOLECYSTECTOMY  2011   [place]  . COLONOSCOPY  07/26/11   Crohn's colitis, ileitis  . ESOPHAGOGASTRODUODENOSCOPY  05/04/11, 07/26/11   granulomatous gastritis - Crohn's  . ESOPHAGOGASTRODUODENOSCOPY (EGD) WITH PROPOFOL N/A 03/28/2017   Procedure: ESOPHAGOGASTRODUODENOSCOPY (EGD) WITH PROPOFOL  with balloon dilation of duodenum;  Surgeon: [name], MD;  Location: [facility];  Service: Gastroenterology;  Laterality: N/A;  . ESOPHAGOGASTRODUODENOSCOPY (EGD) WITH PROPOFOL N/A 12/06/2017   Procedure: ESOPHAGOGASTRODUODENOSCOPY (EGD) WITH PROPOFOL;  Surgeon: Gatha Mayer, MD;  Location: [facility];  Service: Endoscopy;  Laterality: N/A;  . ESOPHAGOGASTRODUODENOSCOPY (EGD) WITH PROPOFOL N/A 06/21/2018   Procedure: ESOPHAGOGASTRODUODENOSCOPY (EGD) WITH PROPOFOL;  Surgeon: Gatha Mayer, MD;  Location: [facility];  Service: Endoscopy;  Laterality: N/A;  . FLEXIBLE SIGMOIDOSCOPY    . GASTROJEJUNOSTOMY  01/2018  . MULTIPLE TOOTH EXTRACTIONS  2005  . SHOULDER SURGERY     right  . UPPER GASTROINTESTINAL ENDOSCOPY    . VASECTOMY     bilateral w/lysis of penile adhesions   Allergies  Allergen Reactions  . Lisinopril Swelling  . Zithromax [Azithromycin] Hypertension   Prior to Admission medications   Medication Sig Start Date End Date Taking? Authorizing Provider  azelastine (ASTELIN) 0.1 % nasal spray PLACE 2 SPRAYS INTO BOTH NOSTRILS 2 (TWO) TIMES DAILY. USE IN EACH NOSTRIL AS DIRECTED 01/04/18  Yes [name], MD  cyclobenzaprine (FLEXERIL) 10 MG tablet Take 10 mg by mouth 3 (three) times daily as needed (migraines).    Yes [provider]  diphenoxylate-atropine  (LOMOTIL) 2.5-0.025 MG tablet Take 1 tablet by mouth 3 (three) times daily before meals. 09/24/18  Yes Gatha Mayer, MD  doxazosin (CARDURA) 8 MG tablet TAKE 1 TABLET BY MOUTH EVERY DAY 09/11/18  Yes [name], MD  hydrALAZINE (APRESOLINE) 50 MG tablet Take 1 tablet (50 mg total) by mouth every 8 (eight) hours for 30 days. 11/11/18 12/11/18 Yes [name], MD  olmesartan (BENICAR) 20 MG tablet Take 1 tablet (20 mg total) by mouth daily. 11/06/18  Yes [name], MD  rizatriptan (MAXALT-MLT) 10 MG disintegrating tablet TAKE 1 TABLET (10 MG TOTAL) BY MOUTH 3 (THREE) TIMES DAILY AS NEEDED FOR MIGRAINE. 05/09/17  Yes Kathrynn Ducking, MD  verapamil (CALAN-SR) 180 MG CR tablet Take 2 tablets (360 mg total) by mouth daily. 08/02/18  Yes [name], MD  fluticasone (FLONASE) 50 MCG/ACT nasal spray Place 1 spray into both nostrils daily. Patient not taking: Reported on 09/24/2018 05/01/18   Loura Halt A, NP  ondansetron (ZOFRAN-ODT) 4 MG disintegrating tablet TAKE 1 TABLET BY MOUTH EVERY 8 HOURS AS NEEDED FOR NAUSEA AND VOMITING Patient not taking: No sig reported 01/15/18   Gatha Mayer, MD  prochlorperazine (COMPAZINE) 25 MG suppository Place 1 suppository (25 mg total) rectally every 12 (twelve) hours as needed for nausea or vomiting. Patient not taking: Reported on 09/24/2018 08/07/18   Gatha Mayer, MD   Social  History   Socioeconomic History  . Marital status: Married    Spouse name: Not on file  . Number of children: 3  . Years of education: Some college  . Highest education level: Not on file  Occupational History  . Occupation: [employer]  Social Needs  . Financial resource strain: Not on file  . Food insecurity:    Worry: Not on file    Inability: Not on file  . Transportation needs:    Medical: Not on file    Non-medical: Not on file  Tobacco Use  . Smoking status: Former Smoker    Packs/day: 0.75    Years: 21.00    Pack years: 15.75    Types: Cigarettes     Last attempt to quit: 12/01/2017    Years since quitting: 0.9  . Smokeless tobacco: Former Systems developer    Types: Snuff  . Tobacco comment: Counseling sheet given in exam room   Substance and Sexual Activity  . Alcohol use: Yes    Alcohol/week: 0.0 standard drinks    Comment: occ  . Drug use: No  . Sexual activity: Yes    Partners: Female  Lifestyle  . Physical activity:    Days per week: Not on file    Minutes per session: Not on file  . Stress: Not on file  Relationships  . Social connections:    Talks on phone: Not on file    Gets together: Not on file    Attends religious service: Not on file    Active member of club or organization: Not on file    Attends meetings of clubs or organizations: Not on file    Relationship status: Not on file  . Intimate partner violence:    Fear of current or ex partner: Not on file    Emotionally abused: Not on file    Physically abused: Not on file    Forced sexual activity: Not on file  Other Topics Concern  . Not on file  Social History Narrative   Married. Education: [employer].    Lives at home w/ his wife and kids   Right-handed   Caffeine: 3 sodas per day     Observations/Objective: Normal responses, no distress.   Assessment and Plan: Essential hypertension - Plan: hydrALAZINE (APRESOLINE) 50 MG tablet, verapamil (CALAN-SR) 180 MG CR tablet, doxazosin (CARDURA) 8 MG tablet  Nocturia - Plan: doxazosin (CARDURA) 8 MG tablet Overall stable blood pressures at home.  Ultimately can look at options of less frequent dosing than current hydralazine plan, but with upcoming procedures and overall stable readings at this time, will continue same regimen with follow-up in 3 months.  Plan on lab work at that time as last checked in February.  RTC precautions if worsening control or new symptoms    Follow Up Instructions: 3 months.   Patient Instructions  Good talking to you today.  Overall blood pressures continue to be stable at this time.   No change in medicines, but when we follow-up in 3 months we can look at other potential options that may not require 3 times per day dosing.  Good luck on upcoming procedures and let me know if there are questions in the meantime.   I discussed the assessment and treatment plan with the patient. The patient was provided an opportunity to ask questions and all were answered. The patient agreed with the plan and demonstrated an understanding of the instructions.   The patient was advised to call  back or seek an in-person evaluation if the symptoms worsen or if the condition fails to improve as anticipated.  I provided 11 minutes of non-face-to-face time during this encounter.  Signed,   Merri Ray, MD [facility].  11/13/18

## 2018-11-20 NOTE — Pre-Procedure Instructions (Signed)
Craig Guzman  11/20/2018      [id number] [address] [id number]: [phone]: [phone] [facility] [address] [address] [place] [facility] to [id number]: [phone]: [phone]   Your procedure is scheduled on May 27. 2020.  [facility]  Call this number if you have problems the morning of surgery:  [phone]   Remember:  Do not eat or drink after midnight.     Take these medicines the morning of surgery with A SIP OF WATER   Nasal Spray - as needed  Olmesartan (Benicar)  7 days prior to surgery STOP taking any Aspirin (unless otherwise instructed by your surgeon), Aleve, Naproxen, Ibuprofen, Motrin, Advil, Goody's, BC's, all herbal medications, fish oil, and all vitamins.     Do not wear jewelry.  Do not wear lotions, powders, colognes, or deodorant.  Men may shave face and neck.  Do not bring valuables to the hospital.  [facility] any belongings or [facility]uables.   [facility]- Preparing For Surgery  Before surgery, you can play an important role. Because skin is not sterile, your skin needs to be as free of germs as possible. You can reduce the number of germs on your skin by washing with CHG (chlorahexidine gluconate) Soap before surgery.  CHG is an antiseptic cleaner which kills germs and bonds with the skin to continue killing germs even after washing.    Oral Hygiene is also important to reduce your risk of infection.  Remember - BRUSH YOUR TEETH THE MORNING OF SURGERY WITH YOUR REGULAR TOOTHPASTE  Please do not use if you have an allergy to CHG or antibacterial soaps. If your skin becomes reddened/irritated stop using the CHG.  Do not shave (including legs and underarms) for at least 48  hours prior to first CHG shower. It is OK to shave your face.  Please follow these instructions carefully.   1. Shower the NIGHT BEFORE SURGERY and the MORNING OF SURGERY with CHG.   2. If you chose to wash your hair, wash your hair first as usual with your normal shampoo.  3. After you shampoo, rinse your hair and body thoroughly to remove the shampoo.  4. Use CHG as you would any other liquid soap. You can apply CHG directly to the skin and wash gently with a scrungie or a clean washcloth.   5. Apply the CHG Soap to your body ONLY FROM THE NECK DOWN.  Do not use on open wounds or open sores. Avoid contact with your eyes, ears, mouth and genitals (private parts). Wash Face and genitals (private parts)  with your normal soap.  6. Wash thoroughly, paying special attention to the area where your surgery will be performed.  7. Thoroughly rinse your body with warm water from the neck down.  8. DO NOT shower/wash with your normal soap after using and rinsing off the CHG Soap.  9. Pat yourself dry with a CLEAN TOWEL.  10. Wear CLEAN PAJAMAS to bed the night before surgery, wear comfortable clothes the morning of surgery  11. Place CLEAN SHEETS on your bed the night of your first shower and DO NOT SLEEP WITH PETS.   Day of Surgery:  Do not apply any  deodorants/lotions.  Please wear clean clothes to the hospital/surgery center.   Remember to brush your teeth WITH YOUR REGULAR TOOTHPASTE.   Contacts, dentures or bridgework may not be worn into surgery.  Leave your suitcase in the car.  After surgery it may be brought to your room.  For patients admitted to the hospital, discharge time will be determined by your treatment team.  Patients discharged the day of surgery will not be allowed to drive home.

## 2018-11-21 ENCOUNTER — Other Ambulatory Visit: Payer: Self-pay | Admitting: Urology

## 2018-11-21 ENCOUNTER — Other Ambulatory Visit: Payer: Self-pay

## 2018-11-21 ENCOUNTER — Encounter (HOSPITAL_COMMUNITY)
Admission: RE | Admit: 2018-11-21 | Discharge: 2018-11-21 | Disposition: A | Discharge status: Home / Self Care | Payer: Managed Care, Other (non HMO) | Source: Ambulatory Visit | Attending: Otolaryngology | Admitting: Otolaryngology

## 2018-11-21 ENCOUNTER — Inpatient Hospital Stay (HOSPITAL_COMMUNITY): Admission: RE | Admit: 2018-11-21 | Payer: Managed Care, Other (non HMO) | Source: Ambulatory Visit

## 2018-11-21 ENCOUNTER — Encounter (HOSPITAL_COMMUNITY): Payer: Self-pay

## 2018-11-21 DIAGNOSIS — Z01812 Encounter for preprocedural laboratory examination: Secondary | ICD-10-CM | POA: Diagnosis present

## 2018-11-21 HISTORY — DX: Personal history of urinary calculi: Z87.442

## 2018-11-21 HISTORY — DX: Dyspnea, unspecified: R06.00

## 2018-11-21 LAB — CBC
HCT: 44.2 % (ref 39.0–52.0)
Hemoglobin: 14.3 g/dL (ref 13.0–17.0)
MCH: 26.6 pg (ref 26.0–34.0)
MCHC: 32.4 g/dL (ref 30.0–36.0)
MCV: 82.2 fL (ref 80.0–100.0)
Platelets: 216 10*3/uL (ref 150–400)
RBC: 5.38 MIL/uL (ref 4.22–5.81)
RDW: 14.4 % (ref 11.5–15.5)
WBC: 5 10*3/uL (ref 4.0–10.5)
nRBC: 0 % (ref 0.0–0.2)

## 2018-11-21 LAB — BASIC METABOLIC PANEL
Anion gap: 12 (ref 5–15)
BUN: 13 mg/dL (ref 6–20)
CO2: 21 mmol/L — ABNORMAL LOW (ref 22–32)
Calcium: 8.8 mg/dL — ABNORMAL LOW (ref 8.9–10.3)
Chloride: 107 mmol/L (ref 98–111)
Creatinine, Ser: 1.04 mg/dL (ref 0.61–1.24)
GFR calc Af Amer: >60 mL/min (ref >60)
GFR calc non Af Amer: >60 mL/min (ref >60)
Glucose, Bld: 113 mg/dL — ABNORMAL HIGH (ref 70–99)
Potassium: 3.6 mmol/L (ref 3.5–5.1)
Sodium: 140 mmol/L (ref 135–145)

## 2018-11-21 NOTE — Progress Notes (Signed)
PCP - Dellis Filbert green @ [facility] Cardiologist - na  Chest x-ray - 04-26-18 EKG - 05-16-18 Stress Test -  2013 ECHO - na Cardiac Cath - na  Sleep Study -  CPAP - 06/2016 in epic under procedure tab  Fasting Blood Sugar - na Checks Blood Sugar _____ times a day  Blood Thinner Instructions:na Aspirin Instructions:  Anesthesia review:   Patient denies shortness of breath, fever, cough and chest pain at PAT appointment   Patient verbalized understanding of instructions that were given to them at the PAT appointment. Patient was also instructed that they will need to review over the PAT instructions again at home before surgery.  Notified James, [place] of pt's weigh and vital signs. Pt. Reported he didn't take his blood pressure medicines today. Encouraged pt. To take them and also on the day of surgery.

## 2018-11-21 NOTE — Progress Notes (Signed)
  Coronavirus Screening  Have you experienced the following symptoms:  Cough no Fever (>100.2F)  no Runny nose no Sore throat no Difficulty breathing/shortness of breath  : yes due to chronic ethmoid sinusitis  Have you or a family member traveled in the last 14 days and where? no   If the patient indicates "YES" to the above questions, their PAT will be rescheduled to limit the exposure to others and, the surgeon will be notified. THE PATIENT WILL NEED TO BE ASYMPTOMATIC FOR 14 DAYS.   If the patient is not experiencing any of these symptoms, the PAT nurse will instruct them to NOT bring anyone with them to their appointment since they may have these symptoms or traveled as well.   Please remind your patients and families that hospital visitation restrictions are in effect and the importance of the restrictions.

## 2018-11-22 ENCOUNTER — Other Ambulatory Visit (HOSPITAL_COMMUNITY)
Admission: RE | Admit: 2018-11-22 | Discharge: 2018-11-22 | Disposition: A | Discharge status: Home / Self Care | Payer: Managed Care, Other (non HMO) | Source: Ambulatory Visit | Attending: Otolaryngology | Admitting: Otolaryngology

## 2018-11-22 DIAGNOSIS — Z01812 Encounter for preprocedural laboratory examination: Secondary | ICD-10-CM | POA: Diagnosis not present

## 2018-11-23 LAB — NOVEL CORONAVIRUS, NAA (HOSP ORDER, SEND-OUT TO REF LAB; TAT 18-24 HRS): SARS-CoV-2, NAA: NOT DETECTED

## 2018-11-26 NOTE — Anesthesia Preprocedure Evaluation (Addendum)
Anesthesia Evaluation  Patient identified by MRN, date of birth, ID band Patient awake    Reviewed: Allergy & Precautions, NPO status , Patient's Chart, lab work & pertinent test results  History of Anesthesia Complications Negative for: history of anesthetic complications  Airway Mallampati: III  TM Distance: >3 FB Neck ROM: Full    Dental no notable dental hx. (+) Dental Advisory Given   Pulmonary sleep apnea , pneumonia, former smoker,    Pulmonary exam normal        Cardiovascular hypertension, Pt. on medications Normal cardiovascular exam     Neuro/Psych  Headaches, negative neurological ROS  negative psych ROS   GI/Hepatic Neg liver ROS, hiatal hernia, PUD, GERD  ,crohn's   Endo/Other  Morbid obesity  Renal/GU      Musculoskeletal negative musculoskeletal ROS (+)   Abdominal (+) + obese,   Peds  Hematology negative hematology ROS (+)   Anesthesia Other Findings   Reproductive/Obstetrics negative OB ROS                            Anesthesia Physical  Anesthesia Plan  ASA: III  Anesthesia Plan: General   Post-op Pain Management:    Induction: Intravenous  PONV Risk Score and Plan: 3 and Ondansetron, Dexamethasone and Scopolamine patch - Pre-op  Airway Management Planned: Oral ETT  Additional Equipment:   Intra-op Plan:   Post-operative Plan: Extubation in OR  Informed Consent: I have reviewed the patients History and Physical, chart, labs and discussed the procedure including the risks, benefits and alternatives for the proposed anesthesia with the patient or authorized representative who has indicated his/her understanding and acceptance.     Dental advisory given  Plan Discussed with: Anesthesiologist and CRNA  Anesthesia Plan Comments:        Anesthesia Quick Evaluation

## 2018-11-27 ENCOUNTER — Encounter (HOSPITAL_COMMUNITY): Payer: Self-pay

## 2018-11-27 ENCOUNTER — Encounter (HOSPITAL_COMMUNITY)
Admission: RE | Disposition: A | Discharge status: Home / Self Care | Payer: Self-pay | Source: Home / Self Care | Attending: Otolaryngology

## 2018-11-27 ENCOUNTER — Other Ambulatory Visit: Payer: Self-pay

## 2018-11-27 ENCOUNTER — Ambulatory Visit (HOSPITAL_COMMUNITY): Payer: Managed Care, Other (non HMO) | Admitting: Anesthesiology

## 2018-11-27 ENCOUNTER — Ambulatory Visit (HOSPITAL_COMMUNITY)
Admission: RE | Admit: 2018-11-27 | Discharge: 2018-11-28 | Disposition: A | Discharge status: Home / Self Care | Payer: Managed Care, Other (non HMO) | Attending: Otolaryngology | Admitting: Otolaryngology

## 2018-11-27 ENCOUNTER — Ambulatory Visit (HOSPITAL_COMMUNITY): Payer: Managed Care, Other (non HMO) | Admitting: Vascular Surgery

## 2018-11-27 DIAGNOSIS — J338 Other polyp of sinus: Secondary | ICD-10-CM

## 2018-11-27 DIAGNOSIS — J343 Hypertrophy of nasal turbinates: Secondary | ICD-10-CM | POA: Insufficient documentation

## 2018-11-27 DIAGNOSIS — J322 Chronic ethmoidal sinusitis: Secondary | ICD-10-CM | POA: Diagnosis not present

## 2018-11-27 DIAGNOSIS — Z6841 Body Mass Index (BMI) 40.0 and over, adult: Secondary | ICD-10-CM | POA: Insufficient documentation

## 2018-11-27 DIAGNOSIS — I10 Essential (primary) hypertension: Secondary | ICD-10-CM | POA: Diagnosis not present

## 2018-11-27 DIAGNOSIS — Z9889 Other specified postprocedural states: Secondary | ICD-10-CM

## 2018-11-27 DIAGNOSIS — J3489 Other specified disorders of nose and nasal sinuses: Secondary | ICD-10-CM | POA: Diagnosis not present

## 2018-11-27 DIAGNOSIS — G4730 Sleep apnea, unspecified: Secondary | ICD-10-CM | POA: Insufficient documentation

## 2018-11-27 DIAGNOSIS — Z87891 Personal history of nicotine dependence: Secondary | ICD-10-CM | POA: Diagnosis not present

## 2018-11-27 DIAGNOSIS — J342 Deviated nasal septum: Secondary | ICD-10-CM | POA: Diagnosis present

## 2018-11-27 HISTORY — PX: NASAL SEPTOPLASTY W/ TURBINOPLASTY: SHX2070

## 2018-11-27 HISTORY — PX: ETHMOIDECTOMY: SHX5197

## 2018-11-27 HISTORY — PX: SINUS ENDO WITH FUSION: SHX5329

## 2018-11-27 SURGERY — SEPTOPLASTY, NOSE, WITH NASAL TURBINATE REDUCTION
Anesthesia: General | Site: Nose

## 2018-11-27 MED ORDER — PHENYLEPHRINE 40 MCG/ML (10ML) SYRINGE FOR IV PUSH (FOR BLOOD PRESSURE SUPPORT)
PREFILLED_SYRINGE | INTRAVENOUS | Status: DC | PRN
  Administered 2018-11-27: 80 ug via INTRAVENOUS
  Administered 2018-11-27: 120 ug via INTRAVENOUS
  Administered 2018-11-27: 80 ug via INTRAVENOUS
  Administered 2018-11-27: 120 ug via INTRAVENOUS

## 2018-11-27 MED ORDER — DOXAZOSIN MESYLATE 8 MG PO TABS
8.0000 mg | ORAL_TABLET | Freq: Every day | ORAL | Status: DC
  Administered 2018-11-27: 8 mg via ORAL
  Filled 2018-11-27 (×2): qty 1

## 2018-11-27 MED ORDER — FENTANYL CITRATE (PF) 100 MCG/2ML IJ SOLN
INTRAMUSCULAR | Status: DC | PRN
  Administered 2018-11-27: 100 ug via INTRAVENOUS

## 2018-11-27 MED ORDER — SCOPOLAMINE 1 MG/3DAYS TD PT72
MEDICATED_PATCH | TRANSDERMAL | Status: AC
  Administered 2018-11-27: 07:00:00 1.5 mg via TRANSDERMAL
  Filled 2018-11-27: qty 1

## 2018-11-27 MED ORDER — IRBESARTAN 150 MG PO TABS
150.0000 mg | ORAL_TABLET | Freq: Every day | ORAL | Status: DC
  Administered 2018-11-27 – 2018-11-28 (×2): 150 mg via ORAL
  Filled 2018-11-27 (×2): qty 1

## 2018-11-27 MED ORDER — LACTATED RINGERS IV SOLN
INTRAVENOUS | Status: DC
  Administered 2018-11-27: 07:00:00 via INTRAVENOUS

## 2018-11-27 MED ORDER — ONDANSETRON HCL 4 MG/2ML IJ SOLN
INTRAMUSCULAR | Status: AC
  Filled 2018-11-27: qty 2

## 2018-11-27 MED ORDER — ACETAMINOPHEN 500 MG PO TABS
1000.0000 mg | ORAL_TABLET | Freq: Once | ORAL | Status: AC
  Administered 2018-11-27: 1000 mg via ORAL

## 2018-11-27 MED ORDER — ROCURONIUM BROMIDE 100 MG/10ML IV SOLN
INTRAVENOUS | Status: DC | PRN
  Administered 2018-11-27: 50 mg via INTRAVENOUS
  Administered 2018-11-27 (×2): 10 mg via INTRAVENOUS

## 2018-11-27 MED ORDER — PROPOFOL 10 MG/ML IV BOLUS
INTRAVENOUS | Status: AC
  Filled 2018-11-27: qty 40

## 2018-11-27 MED ORDER — SUGAMMADEX SODIUM 500 MG/5ML IV SOLN
INTRAVENOUS | Status: DC | PRN
  Administered 2018-11-27: 400 mg via INTRAVENOUS

## 2018-11-27 MED ORDER — ACETAMINOPHEN 500 MG PO TABS
ORAL_TABLET | ORAL | Status: AC
  Administered 2018-11-27: 07:00:00 1000 mg via ORAL
  Filled 2018-11-27: qty 2

## 2018-11-27 MED ORDER — DEXAMETHASONE SODIUM PHOSPHATE 10 MG/ML IJ SOLN
INTRAMUSCULAR | Status: DC | PRN
  Administered 2018-11-27: 10 mg via INTRAVENOUS

## 2018-11-27 MED ORDER — OXYCODONE-ACETAMINOPHEN 5-325 MG PO TABS
1.0000 | ORAL_TABLET | ORAL | Status: DC | PRN
  Administered 2018-11-27 (×4): 2 via ORAL
  Administered 2018-11-28: 05:00:00 1 via ORAL
  Filled 2018-11-27 (×4): qty 2

## 2018-11-27 MED ORDER — DEXTROSE 5 % IV SOLN
INTRAVENOUS | Status: DC | PRN
  Administered 2018-11-27: 08:00:00 3 g via INTRAVENOUS

## 2018-11-27 MED ORDER — PROMETHAZINE HCL 25 MG/ML IJ SOLN: 6.2500 mg | INTRAMUSCULAR | Status: DC | PRN

## 2018-11-27 MED ORDER — SCOPOLAMINE 1 MG/3DAYS TD PT72
1.0000 | MEDICATED_PATCH | TRANSDERMAL | Status: DC
  Administered 2018-11-27: 07:00:00 1.5 mg via TRANSDERMAL

## 2018-11-27 MED ORDER — BACITRACIN-NEOMYCIN-POLYMYXIN 400-5-5000 EX OINT
TOPICAL_OINTMENT | CUTANEOUS | Status: AC
  Filled 2018-11-27: qty 1

## 2018-11-27 MED ORDER — OXYCODONE-ACETAMINOPHEN 5-325 MG PO TABS
ORAL_TABLET | ORAL | Status: AC
  Administered 2018-11-27: 11:00:00 2 via ORAL
  Filled 2018-11-27: qty 2

## 2018-11-27 MED ORDER — LIDOCAINE-EPINEPHRINE 1 %-1:100000 IJ SOLN
INTRAMUSCULAR | Status: DC | PRN
  Administered 2018-11-27: 1 mL

## 2018-11-27 MED ORDER — PHENYLEPHRINE 40 MCG/ML (10ML) SYRINGE FOR IV PUSH (FOR BLOOD PRESSURE SUPPORT)
PREFILLED_SYRINGE | INTRAVENOUS | Status: AC
  Filled 2018-11-27: qty 10

## 2018-11-27 MED ORDER — SODIUM CHLORIDE 0.9 % IR SOLN
Status: DC | PRN
  Administered 2018-11-27: 1000 mL

## 2018-11-27 MED ORDER — PROPOFOL 10 MG/ML IV BOLUS
INTRAVENOUS | Status: DC | PRN
  Administered 2018-11-27: 50 mg via INTRAVENOUS
  Administered 2018-11-27: 200 mg via INTRAVENOUS

## 2018-11-27 MED ORDER — SUCCINYLCHOLINE CHLORIDE 200 MG/10ML IV SOSY
PREFILLED_SYRINGE | INTRAVENOUS | Status: AC
  Filled 2018-11-27: qty 10

## 2018-11-27 MED ORDER — CYCLOBENZAPRINE HCL 10 MG PO TABS
10.0000 mg | ORAL_TABLET | Freq: Three times a day (TID) | ORAL | Status: DC | PRN
  Administered 2018-11-27: 10 mg via ORAL
  Filled 2018-11-27: qty 1

## 2018-11-27 MED ORDER — ROCURONIUM BROMIDE 10 MG/ML (PF) SYRINGE
PREFILLED_SYRINGE | INTRAVENOUS | Status: AC
  Filled 2018-11-27: qty 10

## 2018-11-27 MED ORDER — SUGAMMADEX SODIUM 500 MG/5ML IV SOLN
INTRAVENOUS | Status: AC
  Filled 2018-11-27: qty 5

## 2018-11-27 MED ORDER — DEXAMETHASONE SODIUM PHOSPHATE 10 MG/ML IJ SOLN
INTRAMUSCULAR | Status: AC
  Filled 2018-11-27: qty 1

## 2018-11-27 MED ORDER — HYDRALAZINE HCL 50 MG PO TABS
50.0000 mg | ORAL_TABLET | Freq: Three times a day (TID) | ORAL | Status: DC
  Administered 2018-11-27 – 2018-11-28 (×3): 50 mg via ORAL
  Filled 2018-11-27 (×3): qty 1

## 2018-11-27 MED ORDER — MORPHINE SULFATE (PF) 2 MG/ML IV SOLN: 2.0000 mg | INTRAVENOUS | Status: DC | PRN

## 2018-11-27 MED ORDER — ONDANSETRON HCL 4 MG/2ML IJ SOLN
INTRAMUSCULAR | Status: DC | PRN
  Administered 2018-11-27: 4 mg via INTRAVENOUS

## 2018-11-27 MED ORDER — LIDOCAINE 2% (20 MG/ML) 5 ML SYRINGE
INTRAMUSCULAR | Status: DC | PRN
  Administered 2018-11-27: 80 mg via INTRAVENOUS

## 2018-11-27 MED ORDER — FENTANYL CITRATE (PF) 100 MCG/2ML IJ SOLN
25.0000 ug | INTRAMUSCULAR | Status: DC | PRN
  Administered 2018-11-27 (×2): 50 ug via INTRAVENOUS

## 2018-11-27 MED ORDER — FENTANYL CITRATE (PF) 250 MCG/5ML IJ SOLN
INTRAMUSCULAR | Status: AC
  Filled 2018-11-27: qty 5

## 2018-11-27 MED ORDER — LIDOCAINE 2% (20 MG/ML) 5 ML SYRINGE
INTRAMUSCULAR | Status: AC
  Filled 2018-11-27: qty 5

## 2018-11-27 MED ORDER — ONDANSETRON 4 MG PO TBDP: 4.0000 mg | ORAL_TABLET | Freq: Three times a day (TID) | ORAL | Status: DC | PRN

## 2018-11-27 MED ORDER — SODIUM CHLORIDE 0.9 % IV SOLN
INTRAVENOUS | Status: DC | PRN
  Administered 2018-11-27: 08:00:00 20 ug/min via INTRAVENOUS

## 2018-11-27 MED ORDER — MIDAZOLAM HCL 5 MG/5ML IJ SOLN
INTRAMUSCULAR | Status: DC | PRN
  Administered 2018-11-27: 2 mg via INTRAVENOUS

## 2018-11-27 MED ORDER — MIDAZOLAM HCL 2 MG/2ML IJ SOLN
INTRAMUSCULAR | Status: AC
  Filled 2018-11-27: qty 2

## 2018-11-27 MED ORDER — OXYMETAZOLINE HCL 0.05 % NA SOLN
NASAL | Status: AC
  Filled 2018-11-27: qty 30

## 2018-11-27 MED ORDER — LIDOCAINE-EPINEPHRINE 1 %-1:100000 IJ SOLN
INTRAMUSCULAR | Status: AC
  Filled 2018-11-27: qty 1

## 2018-11-27 MED ORDER — VERAPAMIL HCL ER 180 MG PO TBCR
360.0000 mg | EXTENDED_RELEASE_TABLET | Freq: Every day | ORAL | Status: DC
  Administered 2018-11-28: 360 mg via ORAL
  Filled 2018-11-27: qty 2

## 2018-11-27 MED ORDER — BACITRACIN ZINC 500 UNIT/GM EX OINT
TOPICAL_OINTMENT | CUTANEOUS | Status: AC
  Filled 2018-11-27: qty 28.35

## 2018-11-27 MED ORDER — FENTANYL CITRATE (PF) 100 MCG/2ML IJ SOLN
INTRAMUSCULAR | Status: AC
  Administered 2018-11-27: 50 ug via INTRAVENOUS
  Filled 2018-11-27: qty 2

## 2018-11-27 MED ORDER — SUCCINYLCHOLINE CHLORIDE 200 MG/10ML IV SOSY
PREFILLED_SYRINGE | INTRAVENOUS | Status: DC | PRN
  Administered 2018-11-27: 140 mg via INTRAVENOUS

## 2018-11-27 MED ORDER — OXYMETAZOLINE HCL 0.05 % NA SOLN
NASAL | Status: DC | PRN
  Administered 2018-11-27: 1

## 2018-11-27 MED ORDER — BACITRACIN ZINC 500 UNIT/GM EX OINT
TOPICAL_OINTMENT | CUTANEOUS | Status: DC | PRN
  Administered 2018-11-27: 1 via TOPICAL

## 2018-11-27 MED ORDER — WHITE PETROLATUM EX OINT
TOPICAL_OINTMENT | CUTANEOUS | Status: AC
  Administered 2018-11-27: 0.2
  Filled 2018-11-27: qty 28.35

## 2018-11-27 MED ORDER — KCL IN DEXTROSE-NACL 20-5-0.45 MEQ/L-%-% IV SOLN
INTRAVENOUS | Status: DC
  Administered 2018-11-27: 16:00:00 via INTRAVENOUS

## 2018-11-27 SURGICAL SUPPLY — 53 items
BLADE 10 SAFETY STRL DISP (BLADE) ×3 IMPLANT
BLADE RAD40 ROTATE 4M 4 5PK (BLADE) IMPLANT
BLADE RAD40 ROTATE 4M 4MM 5PK (BLADE)
BLADE RAD60 ROTATE M4 4 5PK (BLADE) IMPLANT
BLADE RAD60 ROTATE M4 4MM 5PK (BLADE)
BLADE ROTATE TRICUT 4MX13CM M4 (BLADE) ×1
BLADE ROTATE TRICUT 4X13 M4 (BLADE) ×3 IMPLANT
BLADE SURG 15 STRL LF DISP TIS (BLADE) IMPLANT
BLADE SURG 15 STRL SS (BLADE)
BLADE TRICUT ROTATE M4 4 5PK (BLADE) IMPLANT
BLADE TRICUT ROTATE M4 4MM 5PK (BLADE)
CANISTER SUCT 3000ML PPV (MISCELLANEOUS) ×4 IMPLANT
COAGULATOR SUCT 6 FR SWTCH (ELECTROSURGICAL) ×1
COAGULATOR SUCT SWTCH 10FR 6 (ELECTROSURGICAL) ×3 IMPLANT
CONT SPEC 4OZ CLIKSEAL STRL BL (MISCELLANEOUS) ×4 IMPLANT
COVER WAND RF STERILE (DRAPES) ×4 IMPLANT
DECANTER SPIKE VIAL GLASS SM (MISCELLANEOUS) ×4 IMPLANT
DRAPE HALF SHEET 40X57 (DRAPES) ×4 IMPLANT
DRAPE SHEET LG 3/4 BI-LAMINATE (DRAPES) ×4 IMPLANT
DRSG NASOPORE 8CM (GAUZE/BANDAGES/DRESSINGS) ×8 IMPLANT
ELECT REM PT RETURN 9FT ADLT (ELECTROSURGICAL)
ELECTRODE REM PT RTRN 9FT ADLT (ELECTROSURGICAL) IMPLANT
FLOSEAL 10ML (HEMOSTASIS) IMPLANT
GAUZE SPONGE 2X2 8PLY STRL LF (GAUZE/BANDAGES/DRESSINGS) ×2 IMPLANT
GLOVE ECLIPSE 7.5 STRL STRAW (GLOVE) ×4 IMPLANT
GOWN STRL REUS W/ TWL LRG LVL3 (GOWN DISPOSABLE) ×4 IMPLANT
GOWN STRL REUS W/TWL LRG LVL3 (GOWN DISPOSABLE) ×6
KIT BASIN OR (CUSTOM PROCEDURE TRAY) ×4 IMPLANT
KIT TURNOVER KIT B (KITS) ×4 IMPLANT
NEEDLE HYPO 25GX1X1/2 BEV (NEEDLE) IMPLANT
NEEDLE SPNL 25GX3.5 QUINCKE BL (NEEDLE) ×4 IMPLANT
NS IRRIG 1000ML POUR BTL (IV SOLUTION) ×4 IMPLANT
PAD ARMBOARD 7.5X6 YLW CONV (MISCELLANEOUS) ×8 IMPLANT
POSITIONER HEAD DONUT 9IN (MISCELLANEOUS) ×4 IMPLANT
SPLINT NASAL DOYLE BI-VL (GAUZE/BANDAGES/DRESSINGS) ×8 IMPLANT
SPONGE GAUZE 2X2 STER 10/PKG (GAUZE/BANDAGES/DRESSINGS) ×2
SPONGE NEURO XRAY DETECT 1X3 (DISPOSABLE) ×4 IMPLANT
SUT CHROMIC 3 0 SH 27 (SUTURE) IMPLANT
SUT CHROMIC 4 0 S 4 (SUTURE) IMPLANT
SUT PLAIN 4 0 ~~LOC~~ 1 (SUTURE) ×4 IMPLANT
SUT PROLENE 2 0 FS (SUTURE) ×4 IMPLANT
TOWEL GREEN STERILE FF (TOWEL DISPOSABLE) ×4 IMPLANT
TOWEL OR 17X24 6PK STRL BLUE (TOWEL DISPOSABLE) ×4 IMPLANT
TRACKER ENT INSTRUMENT (MISCELLANEOUS) ×4 IMPLANT
TRACKER ENT PATIENT (MISCELLANEOUS) ×4 IMPLANT
TRAY ENT MC OR (CUSTOM PROCEDURE TRAY) ×4 IMPLANT
TUBE CONNECTING 12'X1/4 (SUCTIONS) ×1
TUBE CONNECTING 12X1/4 (SUCTIONS) ×3 IMPLANT
TUBE SALEM SUMP 16 FR W/ARV (TUBING) ×4 IMPLANT
TUBING EXTENTION W/L.L. (IV SETS) ×4 IMPLANT
TUBING STRAIGHTSHOT EPS 5PK (TUBING) ×4 IMPLANT
WATER STERILE IRR 1000ML POUR (IV SOLUTION) ×4 IMPLANT
WIPE INSTRUMENT VISIWIPE 73X73 (MISCELLANEOUS) ×4 IMPLANT

## 2018-11-27 NOTE — Op Note (Signed)
DATE OF PROCEDURE: 11/27/2018  OPERATIVE REPORT   SURGEON: Leta Baptist, MD   PREOPERATIVE DIAGNOSES:  1. Severe nasal septal deviation.  2. Bilateral inferior turbinate hypertrophy.  3. Chronic nasal obstruction. 4. Bilateral chronic ethmoid sinusitis.  POSTOPERATIVE DIAGNOSES:  1. Severe nasal septal deviation.  2. Bilateral inferior turbinate hypertrophy.  3. Chronic nasal obstruction. 4. Bilateral chronic ethmoid sinusitis.  PROCEDURE PERFORMED:  1. Septoplasty.  2. Bilateral partial inferior turbinate resection.  3. Bilateral endoscopic total ethmoidectomy. 4. FUSION stereotactic image guidance  ANESTHESIA: General endotracheal tube anesthesia.   COMPLICATIONS: None.   ESTIMATED BLOOD LOSS: 300 mL.   INDICATION FOR PROCEDURE: Craig Guzman. is a 48 y.o. male with a history of chronic nasal obstruction and chronic rhinosinusitis. The patient was treated with antibiotics, antihistamine, decongestant, steroid nasal spray, and systemic steroids. However, the patient continued to be symptomatic. On examination, the patient was noted to have bilateral severe inferior turbinate hypertrophy and significant nasal septal deviation, causing significant nasal obstruction. His CT showed bilateral chronic ethmoid sinus disease. Based on the above findings, the decision was made for the patient to undergo the above-stated procedures. The risks, benefits, alternatives, and details of the procedures were discussed with the patient. Questions were invited and answered. Informed consent was obtained.   DESCRIPTION OF PROCEDURE: The patient was taken to the operating room and placed supine on the operating table. General endotracheal tube anesthesia was administered by the anesthesiologist. The patient was positioned, and prepped and draped in the standard fashion for nasal surgery. Pledgets soaked with Afrin were placed in both nasal cavities for decongestion. The pledgets were subsequently removed.  The FUSION stereotactic image guidance marker was placed. The image guidance system was functional throughout the case.  Examination of the nasal cavity revealed a severe nasal septal deviationd. 1% lidocaine with 1:100,000 epinephrine was injected onto the nasal septum bilaterally. A hemitransfixion incision was made on the left side. The mucosal flap was carefully elevated on the left side. A cartilaginous incision was made 1 cm superior to the caudal margin of the nasal septum. Mucosal flap was also elevated on the right side in the similar fashion. It should be noted that due to the severe septal deviation, the deviated portion of the cartilaginous and bony septum had to be removed in piecemeal fashion. Once the deviated portions were removed, a straight midline septum was achieved. The septum was then quilted with 4-0 plain gut sutures. The hemitransfixion incision was closed with interrupted 4-0 chromic sutures.   The inferior one half of both hypertrophied inferior turbinate was crossclamped with a Kelly clamp. The inferior one half of each inferior turbinate was then resected with a pair of cross cutting scissors. Hemostasis was achieved with a suction cautery device.   Using a 0 endoscope, the left nasal cavity was examined. The left middle turbinate was medialized. A portion was removed to facilitate approach to the ethmoid sinuses. The bony partitions of the anterior and posterior ethmoid cavities were taken down. Edematous mucosa was noted and removed. The sinuses were copiously irrigated with saline solution. The same procedure was repeated on the right side without exception.  Doyle splints were applied to the nasal septum.  The care of the patient was turned over to the anesthesiologist. The patient was awakened from anesthesia without difficulty. The patient was extubated and transferred to the recovery room in good condition.   OPERATIVE FINDINGS: Severe nasal septal deviation and  bilateral inferior turbinate hypertrophy.  Bilateral chronic ethmoid sinusitis.  SPECIMEN: None.   FOLLOWUP CARE: The patient will be observed in the hospital due to his OSA. He will be discharged home tomorrow.  Craig Pounders Raynelle Bring, MD

## 2018-11-27 NOTE — Transfer of Care (Signed)
Immediate Anesthesia Transfer of Care Note  Patient: Craig Guzman.  Procedure(s) Performed: NASAL SEPTOPLASTY WITH BILATERAL TURBINATE REDUCTION (Bilateral Nose) ENDOSCOPIC TOTAL ETHMOIDECTOMY (N/A Nose) SINUS ENDO WITH FUSION (N/A Nose)  Patient Location: PACU  Anesthesia Type:General  Level of Consciousness: awake, alert  and oriented  Airway & Oxygen Therapy: Patient Spontanous Breathing and Patient connected to face mask oxygen  Post-op Assessment: Report given to RN and Post -op Vital signs reviewed and stable  Post vital signs: Reviewed and stable  Last Vitals:  Vitals Value Taken Time  BP 158/90 11/27/2018 10:18 AM  Temp    Pulse 89 11/27/2018 10:18 AM  Resp 21 11/27/2018 10:18 AM  SpO2 98 % 11/27/2018 10:18 AM  Vitals shown include unvalidated device data.  Last Pain:  Vitals:   11/27/18 0652  TempSrc:   PainSc: 0-No pain         Complications: No apparent anesthesia complications

## 2018-11-27 NOTE — H&P (Signed)
Cc: Chronic nasal obstruction, chronic rhinosinusitis  HPI: The patient is a 48 year old male who returns today for his follow-up evaluation. The patient was last seen 1 month ago.  At that time, he was complaining of chronic nasal obstruction and frequent choking sensation with the use of his CPAP machine.  He was noted to have severe nasal mucosal congestion, nasal septal deviation, and bilateral inferior turbinate hypertrophy.  The patient was continued on his daily Flonase nasal spray.  He subsequently underwent a sinus CT scan.  The CT showed opacifications of his ethmoid sinuses.  He also has significant septal deviation and bilateral inferior turbinate hypertrophy.  The patient returns today complaining of persistent nasal obstruction.  He is a habitual mouth breather.  Currently he could not tolerate the use of his CPAP due to his nasal obstruction.  He is interested in more definitive treatment of his conditions. No other ENT, GI, or respiratory issue noted since the last visit.   Exam: General: Communicates without difficulty, morbidly obese, no acute distress. Head: Normocephalic, no evidence injury, no tenderness, facial buttresses intact without stepoff. Eyes: PERRL, EOMI. No scleral icterus, conjunctivae clear. Neuro: CN II exam reveals vision grossly intact.  No nystagmus at any point of gaze. Ears: Auricles well formed without lesions.  Ear canals are intact without mass or lesion.  No erythema or edema is appreciated.  The TMs are intact without fluid. Nose: External evaluation reveals normal support and skin without lesions.  Dorsum is intact.  Anterior rhinoscopy reveals severely congested and edematous mucosa over anterior aspect of the inferior turbinates and deviated nasal septum.  No purulence is noted. Middle meatus is not well visualized. Oral:  Oral cavity and oropharynx are intact, symmetric, without erythema or edema.  Mucosa is moist without lesions. Tonsils 2+. Neck: Full range of  motion without pain.  There is no significant lymphadenopathy.  No masses palpable.  Thyroid bed within normal limits to palpation.  Parotid glands and submandibular glands equal bilaterally without mass.  Trachea is midline. Neuro:  CN 2-12 grossly intact. Gait normal. Vestibular: No nystagmus at any point of gaze.   Assessment 1.  Chronic nasal obstruction, secondary to chronic rhinitis, nasal septal deviation, and bilateral inferior turbinate hypertrophy.  In addition, his recent CT scan also showed bilateral chronic rhinosinusitis, mostly involving his ethmoid sinuses bilaterally.   Plan  1.  The physical exam findings and the CT images are extensively reviewed with the patient and his wife.  2.  The patient can continue with his daily Flonase nasal spray.   3.  Daily nasal saline irrigation.  The instructions on how to perform the irrigation are reviewed.  4.  Based on the above findings, the patient is a candidate to undergo surgical intervention with septoplasty, turbinate reduction, and bilateral ethmoidectomy.  The risks, benefits, alternatives and details of the procedures are extensively discussed.  Questions are invited and answered.   5.  The patient would like to proceed with the procedures.

## 2018-11-27 NOTE — Anesthesia Procedure Notes (Signed)
Procedure Name: Intubation Date/Time: 11/27/2018 8:16 AM Performed by: Candis Shine, CRNA Pre-anesthesia Checklist: Patient identified, Emergency Drugs available, Suction available and Patient being monitored Patient Re-evaluated:Patient Re-evaluated prior to induction Oxygen Delivery Method: Circle System Utilized Preoxygenation: Pre-oxygenation with 100% oxygen Induction Type: IV induction and Rapid sequence Laryngoscope Size: Glidescope and 4 Grade View: Grade I Tube type: Oral Tube size: 7.5 mm Number of attempts: 2 Airway Equipment and Method: Video-laryngoscopy and Rigid stylet Placement Confirmation: ETT inserted through vocal cords under direct vision,  positive ETCO2 and breath sounds checked- equal and bilateral Secured at: 22 cm Tube secured with: Tape Dental Injury: Teeth and Oropharynx as per pre-operative assessment  Difficulty Due To: Difficulty was anticipated Future Recommendations: Recommend- induction with short-acting agent, and alternative techniques readily available Comments: DL x 1 with Mac 4, grade III view. 1 attempt with glidescope 4, grade I view, atraumatic oral intubation.

## 2018-11-27 NOTE — Discharge Instructions (Signed)
POSTOPERATIVE INSTRUCTIONS FOR PATIENTS HAVING NASAL OR SINUS OPERATIONS ACTIVITY: Restrict activity at home for the first two days, resting as much as possible. Light activity is best. You may usually return to work within a week. You should refrain from nose blowing, strenuous activity, or heavy lifting greater than 20lbs for a total of three weeks after your operation.  If sneezing cannot be avoided, sneeze with your mouth open. DISCOMFORT: You may experience a dull headache and pressure along with nasal congestion and discharge. These symptoms may be worse during the first week after the operation but may last as long as two to four weeks.  Please take Tylenol or the pain medication that has been prescribed for you. Do not take aspirin or aspirin containing medications since they may cause bleeding.  You may experience symptoms of post nasal drainage, nasal congestion, headaches and fatigue for two or three months after your operation.  BLEEDING: You may have some blood tinged nasal drainage for approximately two weeks after the operation.  The discharge will be worse for the first week.  Please call our office at [phone] or go to the nearest hospital emergency room if you experience any of the following: heavy, bright red blood from your nose or mouth that lasts longer than ten minutes or coughing up or vomiting bright red blood or blood clots. GENERAL CONSIDERATIONS: 1. A gauze dressing will be placed on your upper lip to absorb any drainage after the operation. You may need to change this several times a day.  If you do not have very much drainage, you may remove the dressing.  Remember that you may gently wipe your nose with a tissue and sniff in, but DO NOT blow your nose. 2. Please keep all of your postoperative appointments.  Your final results after the operation will depend on proper follow-up.  The initial visit is usually four to seven days after the operation.  During this visit, the  remaining nasal packing and internal septal splints will be removed.  Your nasal and sinus cavities will be cleaned.  During the second visit, your nasal and sinus cavities will be cleaned again. Have someone drive you to your first two postoperative appointments. We suggest that you take your prescribed pain medication about  hour prior to each of these two appointments.  3. How you care for your nose after the operation will influence the results that you obtain.  You should follow all directions, take your medication as prescribed, and call our office [phone] with any problems or questions. 4. You may be more comfortable sleeping with your head elevated on two pillows. 5. Do not take any medications that we have not prescribed or recommended. WARNING SIGNS: if any of the following should occur, please call our office: 1. Bright red bleeding which lasts more than 10 minutes. 2. Persistent fever greater than 102F. 3. Persistent vomiting. 4. Severe and constant pain that is not relieved by prescribed pain medication. 5. Trauma to the nose. 6. Rash or unusual side effects from any medicines.

## 2018-11-28 ENCOUNTER — Encounter (HOSPITAL_COMMUNITY): Payer: Self-pay | Admitting: Otolaryngology

## 2018-11-28 DIAGNOSIS — J342 Deviated nasal septum: Secondary | ICD-10-CM | POA: Diagnosis not present

## 2018-11-28 MED ORDER — AMOXICILLIN 875 MG PO TABS: 875.0000 mg | ORAL_TABLET | Freq: Two times a day (BID) | ORAL | 0 refills | Status: AC

## 2018-11-28 MED ORDER — OXYCODONE-ACETAMINOPHEN 5-325 MG PO TABS: 1.0000 | ORAL_TABLET | ORAL | 0 refills | Status: AC | PRN

## 2018-11-28 NOTE — Plan of Care (Signed)
  Problem: Education: Goal: Knowledge of General Education information will improve Description Including pain rating scale, medication(s)/side effects and non-pharmacologic comfort measures Outcome: Progressing   Problem: Activity: Goal: Risk for activity intolerance will decrease Outcome: Adequate for Discharge   Problem: Nutrition: Goal: Adequate nutrition will be maintained Outcome: Adequate for Discharge

## 2018-11-28 NOTE — Discharge Summary (Signed)
Physician Discharge Summary  Patient ID: Craig Guzman. MRN: [id number] DOB/AGE: 07-Jan-1971 48 y.o.  Admit date: 11/27/2018 Discharge date: 11/28/2018  Admission Diagnoses: Chronic nasal obstruction, rhinosinusitis  Discharge Diagnoses: same Active Problems:   S/P functional endoscopic sinus surgery   Discharged Condition: Good  Hospital Course: Pt had an uneventful overnight stay.  No bleeding. No stridor.  Consult:  None  Significant Diagnostic Studies: None  Treatments: Sinus surgery  Discharge Exam: Blood pressure (!) 142/84, pulse 91, temperature 98 F (36.7 C), temperature source Oral, resp. rate 19, height 6' (1.829 m), weight (!) 166.9 kg, SpO2 99 %.    Disposition: Discharge disposition: 01-Home or Self Care       Discharge Instructions    Activity as tolerated - No restrictions   Complete by:  As directed    Diet general   Complete by:  As directed      Allergies as of 11/28/2018      Reactions   Zithromax [azithromycin] Hypertension   Lisinopril Swelling   SWELLING REACTION UNSPECIFIED       Medication List    TAKE these medications   amoxicillin 875 MG tablet Commonly known as:  AMOXIL Take 1 tablet (875 mg total) by mouth 2 (two) times daily for 5 days.   azelastine 0.1 % nasal spray Commonly known as:  ASTELIN PLACE 2 SPRAYS INTO BOTH NOSTRILS 2 (TWO) TIMES DAILY. USE IN EACH NOSTRIL AS DIRECTED What changed:    when to take this  reasons to take this   betamethasone valerate ointment 0.1 % Commonly known as:  VALISONE Apply 1 application topically daily as needed (skin irritation.).   cyclobenzaprine 10 MG tablet Commonly known as:  FLEXERIL Take 10 mg by mouth 3 (three) times daily as needed (migraines).   diphenoxylate-atropine 2.5-0.025 MG tablet Commonly known as:  LOMOTIL Take 1 tablet by mouth 3 (three) times daily before meals.   doxazosin 8 MG tablet Commonly known as:  CARDURA Take 1 tablet (8 mg total) by mouth  daily. What changed:  when to take this   fluticasone 50 MCG/ACT nasal spray Commonly known as:  FLONASE Place 1 spray into both nostrils daily.   hydrALAZINE 50 MG tablet Commonly known as:  APRESOLINE Take 1 tablet (50 mg total) by mouth every 8 (eight) hours for 30 days.   olmesartan 20 MG tablet Commonly known as:  BENICAR Take 1 tablet (20 mg total) by mouth daily.   ondansetron 4 MG disintegrating tablet Commonly known as:  ZOFRAN-ODT TAKE 1 TABLET BY MOUTH EVERY 8 HOURS AS NEEDED FOR NAUSEA AND VOMITING What changed:  See the new instructions.   oxyCODONE-acetaminophen 5-325 MG tablet Commonly known as:  PERCOCET/ROXICET Take 1 tablet by mouth every 4 (four) hours as needed for up to 5 days for severe pain.   prochlorperazine 25 MG suppository Commonly known as:  COMPAZINE Place 1 suppository (25 mg total) rectally every 12 (twelve) hours as needed for nausea or vomiting.   rizatriptan 10 MG disintegrating tablet Commonly known as:  MAXALT-MLT TAKE 1 TABLET (10 MG TOTAL) BY MOUTH 3 (THREE) TIMES DAILY AS NEEDED FOR MIGRAINE.   verapamil 180 MG CR tablet Commonly known as:  CALAN-SR Take 2 tablets (360 mg total) by mouth daily.      Follow-up Information    Leta Baptist, MD On 11/29/2018.   Specialty:  Otolaryngology Why:  at [employer] information: [place] [address] [phone]  Signed: Burley Saver 11/28/2018, 7:20 AM

## 2018-11-28 NOTE — Progress Notes (Signed)
Patient discharging home. Discharge instructions explained to patient and he verbalized understanding. Took all personal belongings. No further questions or concerns voiced.

## 2018-11-28 NOTE — Anesthesia Postprocedure Evaluation (Signed)
Anesthesia Post Note  Patient: Craig Guzman.  Procedure(s) Performed: NASAL SEPTOPLASTY WITH BILATERAL TURBINATE REDUCTION (Bilateral Nose) ENDOSCOPIC TOTAL ETHMOIDECTOMY (N/A Nose) SINUS ENDO WITH FUSION (N/A Nose)     Patient location during evaluation: PACU Anesthesia Type: General Level of consciousness: sedated Pain management: pain level controlled Vital Signs Assessment: post-procedure vital signs reviewed and stable Respiratory status: spontaneous breathing and respiratory function stable Cardiovascular status: stable Postop Assessment: no apparent nausea or vomiting Anesthetic complications: no                   Vesna Kable DANIEL

## 2018-12-03 NOTE — Patient Instructions (Addendum)
Craig Guzman.  12/03/2018   Your procedure is scheduled on: Monday 12/09/2018   Report to [facility] Main  Entrance              Report to admitting at  10:30  AM               YOU NEED TO HAVE A COVID 19 TEST ON______THURSDAY_6-4-2020_______, THIS TEST MUST BE DONE BEFORE SURGERY, COME TO [place].    Call this number if you have problems the morning of surgery [phone]    Remember: Do not eat food or drink liquids :After Midnight.               BRUSH YOUR TEETH MORNING OF SURGERY AND RINSE YOUR MOUTH OUT, NO CHEWING GUM CANDY OR MINTS.     Take these medicines the morning of surgery with A SIP OF WATER: Hydralazine (Apresoline), Verapamil (Calan-SR)                                  You may not have any metal on your body including hair pins and              piercings  Do not wear jewelry, make-up, lotions, powders or perfumes, deodorant                     Men may shave face and neck.   Do not bring valuables to the hospital. [place].  Contacts, dentures or bridgework may not be worn into surgery.  Leave suitcase in the car. After surgery it may be brought to your room.     Patients discharged the day of surgery will not be allowed to drive home. IF YOU ARE HAVING SURGERY AND GOING HOME THE SAME DAY, YOU MUST HAVE AN ADULT TO DRIVE YOU HOME AND BE WITH YOU FOR 24 HOURS. YOU MAY GO HOME BY TAXI OR UBER OR ORTHERWISE, BUT AN ADULT MUST ACCOMPANY YOU HOME AND STAY WITH YOU FOR 24 HOURS.  Name and phone number of your driver:               Please read over the following fact sheets you were given: _____________________________________________________________________             [facility] - Preparing for Surgery Before surgery, you can play an important role.  Because skin is not sterile, your skin needs to be as free of germs as possible.  You can  reduce the number of germs on your skin by washing with CHG (chlorahexidine gluconate) soap before surgery.  CHG is an antiseptic cleaner which kills germs and bonds with the skin to continue killing germs even after washing. Please DO NOT use if you have an allergy to CHG or antibacterial soaps.  If your skin becomes reddened/irritated stop using the CHG and inform your nurse when you arrive at Short Stay. Do not shave (including legs and underarms) for at least 48 hours prior to the first CHG shower.  You may shave your face/neck. Please follow these instructions carefully:  1.  Shower with CHG Soap the night before surgery and the  morning of Surgery.  2.  If you  choose to wash your hair, wash your hair first as usual with your  normal  shampoo.  3.  After you shampoo, rinse your hair and body thoroughly to remove the  shampoo.                           4.  Use CHG as you would any other liquid soap.  You can apply chg directly  to the skin and wash                       Gently with a scrungie or clean washcloth.  5.  Apply the CHG Soap to your body ONLY FROM THE NECK DOWN.   Do not use on face/ open                           Wound or open sores. Avoid contact with eyes, ears mouth and genitals (private parts).                       Wash face,  Genitals (private parts) with your normal soap.             6.  Wash thoroughly, paying special attention to the area where your surgery  will be performed.  7.  Thoroughly rinse your body with warm water from the neck down.  8.  DO NOT shower/wash with your normal soap after using and rinsing off  the CHG Soap.                9.  Pat yourself dry with a clean towel.            10.  Wear clean pajamas.            11.  Place clean sheets on your bed the night of your first shower and do not  sleep with pets. Day of Surgery : Do not apply any lotions/deodorants the morning of surgery.  Please wear clean clothes to the hospital/surgery center.  FAILURE TO  FOLLOW THESE INSTRUCTIONS MAY RESULT IN THE CANCELLATION OF YOUR SURGERY PATIENT SIGNATURE_________________________________  NURSE SIGNATURE__________________________________  ________________________________________________________________________

## 2018-12-03 NOTE — Progress Notes (Signed)
08/08/2018- noted in Epic-CT abd. Pelvis w/wo contrast  05/15/2018- noted in Epic-EKG

## 2018-12-04 ENCOUNTER — Encounter (HOSPITAL_COMMUNITY)
Admission: RE | Admit: 2018-12-04 | Discharge: 2018-12-04 | Disposition: A | Discharge status: Home / Self Care | Payer: Managed Care, Other (non HMO) | Source: Ambulatory Visit | Attending: Urology | Admitting: Urology

## 2018-12-04 ENCOUNTER — Encounter (HOSPITAL_COMMUNITY): Payer: Self-pay

## 2018-12-04 ENCOUNTER — Other Ambulatory Visit: Payer: Self-pay

## 2018-12-04 DIAGNOSIS — Z01812 Encounter for preprocedural laboratory examination: Secondary | ICD-10-CM | POA: Diagnosis present

## 2018-12-04 DIAGNOSIS — Z1159 Encounter for screening for other viral diseases: Secondary | ICD-10-CM | POA: Diagnosis not present

## 2018-12-04 LAB — CBC
HCT: 40.7 % (ref 39.0–52.0)
Hemoglobin: 13.1 g/dL (ref 13.0–17.0)
MCH: 26.8 pg (ref 26.0–34.0)
MCHC: 32.2 g/dL (ref 30.0–36.0)
MCV: 83.4 fL (ref 80.0–100.0)
Platelets: 217 10*3/uL (ref 150–400)
RBC: 4.88 MIL/uL (ref 4.22–5.81)
RDW: 14.5 % (ref 11.5–15.5)
WBC: 6.1 10*3/uL (ref 4.0–10.5)
nRBC: 0 % (ref 0.0–0.2)

## 2018-12-04 LAB — COMPREHENSIVE METABOLIC PANEL
ALT: 32 U/L (ref 0–44)
AST: 26 U/L (ref 15–41)
Albumin: 3.6 g/dL (ref 3.5–5.0)
Alkaline Phosphatase: 112 U/L (ref 38–126)
Anion gap: 6 (ref 5–15)
BUN: 12 mg/dL (ref 6–20)
CO2: 24 mmol/L (ref 22–32)
Calcium: 8.5 mg/dL — ABNORMAL LOW (ref 8.9–10.3)
Chloride: 106 mmol/L (ref 98–111)
Creatinine, Ser: 0.96 mg/dL (ref 0.61–1.24)
GFR calc Af Amer: >60 mL/min (ref >60)
GFR calc non Af Amer: >60 mL/min (ref >60)
Glucose, Bld: 114 mg/dL — ABNORMAL HIGH (ref 70–99)
Potassium: 3.7 mmol/L (ref 3.5–5.1)
Sodium: 136 mmol/L (ref 135–145)
Total Bilirubin: 0.5 mg/dL (ref 0.3–1.2)
Total Protein: 7.7 g/dL (ref 6.5–8.1)

## 2018-12-04 NOTE — Progress Notes (Addendum)
Anesthesia Chart Review   Case:  [id number] Date/Time:  12/09/18 1215   Procedure:  CIRCUMCISION ADULT (N/A )   Anesthesia type:  General   Pre-op diagnosis:  PHIMOSIS   Location:  [place] [facility]   Surgeon:  Lucas Mallow, MD      DISCUSSION:48 yo former smoker (15.75 pack years, quit 12/01/17) with h/o GERD, sleep apnea (does not use CPAP), HTN, migraines, Crohn's disease, phimosis scheduled for above procedure 12/09/2018 with Dr. Link Snuffer.    S/p septoplasty, bilateral partial inferior turbinate resection, bilateral endoscopic total ethmoidectomy 11/27/2018.  He had a follow up with surgeon on 11/29/2018, reports sutures removed.  He reports persistent post nasal drip and occasionally spitting up scant amount of blood, this has improved somewhat since surgery.  He is still experiencing difficulty breathing through nose, mouth breathing.  Lungs clear to auscultation on exam.    No further recommendations for anesthesia per Dr. Jamesetta Geralds with ENT regarding recent nasal surgery.  He reports nasal splints have been removed and anesthesia can proceed with standard protocol.   Discussed with Dr. Lanetta Inch.  Anticipate pt can proceed with planned procedure barring acute status change.  VS: BP (!) 147/90   Pulse (!) 102   Resp 20   SpO2 99%   PROVIDERS: [name], MD is PCP    LABS: Labs reviewed: Acceptable for surgery. (all labs ordered are listed, but only abnormal results are displayed)  Labs Reviewed  COMPREHENSIVE METABOLIC PANEL - Abnormal; Notable for the following components:      Result Value   Glucose, Bld 114 (*)    Calcium 8.5 (*)    All other components within normal limits  CBC     IMAGES:   EKG: 05/16/18 Rate 79 bpm Sinus rhythm  Nonspecific T abnormalities, lateral leads   CV:  Past Medical History:  Diagnosis Date  . Allergy    trees/ grasses/ animals/dust/mold  . Biliary dyskinesia   . Colloid thyroid nodule   . Crohn's disease  ([place])    Stomach, terminal ileum, cecum  . Dyspnea    due to nasal /sinus congestion  . Gastric ulcer    antral  . GERD (gastroesophageal reflux disease)   . 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 History of migraine headaches   . HTN (hypertension)   . Kidney stone 09/20/2012  . Migraine   . Obesity   . Pneumonia 02/27/2014   ED  . Sleep apnea    has cpap- does not wear     Past Surgical History:  Procedure Laterality Date  . BALLOON DILATION N/A 12/06/2017   Procedure: BALLOON DILATION;  Surgeon: Gatha Mayer, MD;  Location: [facility];  Service: Endoscopy;  Laterality: N/A;  . BIOPSY  06/21/2018   Procedure: BIOPSY;  Surgeon: Gatha Mayer, MD;  Location: [facility];  Service: Endoscopy;;  . CHOLECYSTECTOMY  2011   [place]  . COLONOSCOPY  07/26/11   Crohn's colitis, ileitis  . ESOPHAGOGASTRODUODENOSCOPY  05/04/11, 07/26/11   granulomatous gastritis - Crohn's  . ESOPHAGOGASTRODUODENOSCOPY (EGD) WITH PROPOFOL N/A 03/28/2017   Procedure: ESOPHAGOGASTRODUODENOSCOPY (EGD) WITH PROPOFOL  with balloon dilation of duodenum;  Surgeon: [name], MD;  Location: [facility];  Service: Gastroenterology;  Laterality: N/A;  . ESOPHAGOGASTRODUODENOSCOPY (EGD) WITH PROPOFOL N/A 12/06/2017   Procedure: ESOPHAGOGASTRODUODENOSCOPY (EGD) WITH PROPOFOL;  Surgeon: Gatha Mayer, MD;  Location: [facility];  Service: Endoscopy;  Laterality: N/A;  . ESOPHAGOGASTRODUODENOSCOPY (EGD) WITH PROPOFOL N/A 06/21/2018   Procedure: ESOPHAGOGASTRODUODENOSCOPY (EGD) WITH PROPOFOL;  Surgeon: Gatha Mayer, MD;  Location: [facility];  Service: Endoscopy;  Laterality: N/A;  . ETHMOIDECTOMY N/A 11/27/2018   Procedure: ENDOSCOPIC TOTAL ETHMOIDECTOMY;  Surgeon: Leta Baptist, MD;  Location: [place];  Service: ENT;  Laterality: N/A;  . FLEXIBLE SIGMOIDOSCOPY    . GASTROJEJUNOSTOMY  01/2018  . MULTIPLE TOOTH EXTRACTIONS  2005  . NASAL SEPTOPLASTY W/  TURBINOPLASTY Bilateral 11/27/2018   Procedure: NASAL SEPTOPLASTY WITH BILATERAL TURBINATE REDUCTION;  Surgeon: Leta Baptist, MD;  Location: [facility];  Service: ENT;  Laterality: Bilateral;  . SHOULDER SURGERY     right  . SINUS ENDO WITH FUSION N/A 11/27/2018   Procedure: SINUS ENDO WITH FUSION;  Surgeon: Leta Baptist, MD;  Location: [facility];  Service: ENT;  Laterality: N/A;  . UPPER GASTROINTESTINAL ENDOSCOPY    . VASECTOMY     bilateral w/lysis of penile adhesions    MEDICATIONS: . azelastine (ASTELIN) 0.1 % nasal spray  . betamethasone valerate ointment (VALISONE) 0.1 %  . cyclobenzaprine (FLEXERIL) 10 MG tablet  . diphenoxylate-atropine (LOMOTIL) 2.5-0.025 MG tablet  . doxazosin (CARDURA) 8 MG tablet  . fluticasone (FLONASE) 50 MCG/ACT nasal spray  . hydrALAZINE (APRESOLINE) 50 MG tablet  . olmesartan (BENICAR) 20 MG tablet  . ondansetron (ZOFRAN-ODT) 4 MG disintegrating tablet  . prochlorperazine (COMPAZINE) 25 MG suppository  . rizatriptan (MAXALT-MLT) 10 MG disintegrating tablet  . verapamil (CALAN-SR) 180 MG CR tablet   No current facility-administered medications for this encounter.     Maia Plan [facility] Pre-Surgical Testing [phone] 12/04/18 11:48 AM

## 2018-12-05 ENCOUNTER — Other Ambulatory Visit (HOSPITAL_COMMUNITY)
Admission: RE | Admit: 2018-12-05 | Discharge: 2018-12-05 | Disposition: A | Discharge status: Home / Self Care | Payer: Managed Care, Other (non HMO) | Source: Ambulatory Visit | Attending: Urology | Admitting: Urology

## 2018-12-05 DIAGNOSIS — Z01812 Encounter for preprocedural laboratory examination: Secondary | ICD-10-CM | POA: Diagnosis not present

## 2018-12-06 LAB — NOVEL CORONAVIRUS, NAA (HOSP ORDER, SEND-OUT TO REF LAB; TAT 18-24 HRS): SARS-CoV-2, NAA: NOT DETECTED

## 2018-12-06 NOTE — Progress Notes (Signed)
SPOKE W/  patinet Wyn Quaker via phone:   SCREENING SYMPTOMS OF COVID 19: Pt. Had nasal surgery last week so many of the questions are "YES" related to surgery  COUGH--no  RUNNY NOSE---yes  surgery last week  SORE THROAT---yes- drainage post surgery  NASAL CONGESTION----yes due to surgery  SNEEZING----no  SHORTNESS OF BREATH---no  DIFFICULTY BREATHING---Yes- due to nasal surgery  TEMP >100.0 -----no  UNEXPLAINED BODY ACHES------no  CHILLS -------- no  HEADACHES ---------no  LOSS OF SMELL/ TASTE -------yes  -not able to smell due to packing in nasal passage    HAVE YOU OR ANY FAMILY MEMBER TRAVELLED PAST 14 DAYS OUT OF THE   COUNTY---no STATE----no COUNTRY----no  HAVE YOU OR ANY FAMILY MEMBER BEEN EXPOSED TO ANYONE WITH COVID 19?   NO

## 2018-12-06 NOTE — Anesthesia Preprocedure Evaluation (Addendum)
Anesthesia Evaluation  Patient identified by MRN, date of birth, ID band Patient awake    Reviewed: Allergy & Precautions, NPO status , Patient's Chart, lab work & pertinent test results  History of Anesthesia Complications Negative for: history of anesthetic complications  Airway Mallampati: III  TM Distance: >3 FB Neck ROM: Full    Dental  (+) Chipped,    Pulmonary sleep apnea (noncompliant with CPAP) , former smoker,    Pulmonary exam normal        Cardiovascular hypertension, Pt. on medications Normal cardiovascular exam     Neuro/Psych  Headaches,    GI/Hepatic Neg liver ROS, PUD, GERD  Controlled,  Endo/Other  Morbid obesity  Renal/GU negative Renal ROS     Musculoskeletal negative musculoskeletal ROS (+)   Abdominal   Peds  Hematology negative hematology ROS (+)   Anesthesia Other Findings Day of surgery medications reviewed with the patient.  Reproductive/Obstetrics                           Anesthesia Physical Anesthesia Plan  ASA: III  Anesthesia Plan: General   Post-op Pain Management:    Induction: Intravenous  PONV Risk Score and Plan: 3 and Treatment may vary due to age or medical condition, Ondansetron, Midazolam and Dexamethasone  Airway Management Planned: Oral ETT  Additional Equipment:   Intra-op Plan:   Post-operative Plan: Extubation in OR  Informed Consent: I have reviewed the patients History and Physical, chart, labs and discussed the procedure including the risks, benefits and alternatives for the proposed anesthesia with the patient or authorized representative who has indicated his/her understanding and acceptance.     Dental advisory given  Plan Discussed with: CRNA  Anesthesia Plan Comments: (See PAT note 12/04/2018, Konrad Felix, PA-C)      Anesthesia Quick Evaluation

## 2018-12-08 MED ORDER — DEXTROSE 5 % IV SOLN
3.0000 g | INTRAVENOUS | Status: AC
  Administered 2018-12-09: 3 g via INTRAVENOUS
  Filled 2018-12-08: qty 3

## 2018-12-09 ENCOUNTER — Encounter (HOSPITAL_COMMUNITY): Payer: Self-pay | Admitting: Emergency Medicine

## 2018-12-09 ENCOUNTER — Other Ambulatory Visit: Payer: Self-pay

## 2018-12-09 ENCOUNTER — Ambulatory Visit (HOSPITAL_COMMUNITY)
Admission: RE | Admit: 2018-12-09 | Discharge: 2018-12-09 | Disposition: A | Discharge status: Home / Self Care | Payer: Managed Care, Other (non HMO) | Attending: Urology | Admitting: Urology

## 2018-12-09 ENCOUNTER — Encounter (HOSPITAL_COMMUNITY)
Admission: RE | Disposition: A | Discharge status: Home / Self Care | Payer: Self-pay | Source: Home / Self Care | Attending: Urology

## 2018-12-09 ENCOUNTER — Ambulatory Visit (HOSPITAL_COMMUNITY): Payer: Managed Care, Other (non HMO) | Admitting: Physician Assistant

## 2018-12-09 ENCOUNTER — Ambulatory Visit (HOSPITAL_COMMUNITY): Payer: Managed Care, Other (non HMO) | Admitting: Anesthesiology

## 2018-12-09 DIAGNOSIS — R51 Headache: Secondary | ICD-10-CM | POA: Diagnosis not present

## 2018-12-09 DIAGNOSIS — K5090 Crohn's disease, unspecified, without complications: Secondary | ICD-10-CM | POA: Diagnosis not present

## 2018-12-09 DIAGNOSIS — Z833 Family history of diabetes mellitus: Secondary | ICD-10-CM | POA: Diagnosis not present

## 2018-12-09 DIAGNOSIS — K219 Gastro-esophageal reflux disease without esophagitis: Secondary | ICD-10-CM | POA: Diagnosis not present

## 2018-12-09 DIAGNOSIS — Z8711 Personal history of peptic ulcer disease: Secondary | ICD-10-CM | POA: Diagnosis not present

## 2018-12-09 DIAGNOSIS — Z888 Allergy status to other drugs, medicaments and biological substances status: Secondary | ICD-10-CM | POA: Diagnosis not present

## 2018-12-09 DIAGNOSIS — Z8379 Family history of other diseases of the digestive system: Secondary | ICD-10-CM | POA: Insufficient documentation

## 2018-12-09 DIAGNOSIS — Z87891 Personal history of nicotine dependence: Secondary | ICD-10-CM | POA: Diagnosis not present

## 2018-12-09 DIAGNOSIS — I10 Essential (primary) hypertension: Secondary | ICD-10-CM | POA: Insufficient documentation

## 2018-12-09 DIAGNOSIS — Z6841 Body Mass Index (BMI) 40.0 and over, adult: Secondary | ICD-10-CM | POA: Diagnosis not present

## 2018-12-09 DIAGNOSIS — Z79899 Other long term (current) drug therapy: Secondary | ICD-10-CM | POA: Diagnosis not present

## 2018-12-09 DIAGNOSIS — Z87442 Personal history of urinary calculi: Secondary | ICD-10-CM | POA: Diagnosis not present

## 2018-12-09 DIAGNOSIS — Z881 Allergy status to other antibiotic agents status: Secondary | ICD-10-CM | POA: Diagnosis not present

## 2018-12-09 DIAGNOSIS — M109 Gout, unspecified: Secondary | ICD-10-CM | POA: Insufficient documentation

## 2018-12-09 DIAGNOSIS — G4733 Obstructive sleep apnea (adult) (pediatric): Secondary | ICD-10-CM | POA: Insufficient documentation

## 2018-12-09 DIAGNOSIS — N471 Phimosis: Secondary | ICD-10-CM | POA: Diagnosis not present

## 2018-12-09 DIAGNOSIS — E7800 Pure hypercholesterolemia, unspecified: Secondary | ICD-10-CM | POA: Diagnosis not present

## 2018-12-09 DIAGNOSIS — G4730 Sleep apnea, unspecified: Secondary | ICD-10-CM | POA: Insufficient documentation

## 2018-12-09 DIAGNOSIS — Z9119 Patient's noncompliance with other medical treatment and regimen: Secondary | ICD-10-CM | POA: Diagnosis not present

## 2018-12-09 HISTORY — PX: CIRCUMCISION: SHX1350

## 2018-12-09 SURGERY — CIRCUMCISION, ADULT
Anesthesia: General

## 2018-12-09 MED ORDER — SUCCINYLCHOLINE CHLORIDE 20 MG/ML IJ SOLN
INTRAMUSCULAR | Status: DC | PRN
  Administered 2018-12-09: 200 mg via INTRAVENOUS

## 2018-12-09 MED ORDER — FENTANYL CITRATE (PF) 250 MCG/5ML IJ SOLN
INTRAMUSCULAR | Status: AC
  Filled 2018-12-09: qty 5

## 2018-12-09 MED ORDER — PROPOFOL 10 MG/ML IV BOLUS
INTRAVENOUS | Status: AC
  Filled 2018-12-09: qty 20

## 2018-12-09 MED ORDER — ONDANSETRON HCL 4 MG/2ML IJ SOLN
INTRAMUSCULAR | Status: DC | PRN
  Administered 2018-12-09: 4 mg via INTRAVENOUS

## 2018-12-09 MED ORDER — PHENYLEPHRINE 40 MCG/ML (10ML) SYRINGE FOR IV PUSH (FOR BLOOD PRESSURE SUPPORT)
PREFILLED_SYRINGE | INTRAVENOUS | Status: AC
  Filled 2018-12-09: qty 10

## 2018-12-09 MED ORDER — BUPIVACAINE HCL (PF) 0.25 % IJ SOLN
INTRAMUSCULAR | Status: DC | PRN
  Administered 2018-12-09: 10 mL

## 2018-12-09 MED ORDER — FENTANYL CITRATE (PF) 100 MCG/2ML IJ SOLN: 25.0000 ug | INTRAMUSCULAR | Status: DC | PRN

## 2018-12-09 MED ORDER — PROMETHAZINE HCL 25 MG/ML IJ SOLN
6.2500 mg | INTRAMUSCULAR | Status: DC | PRN
  Administered 2018-12-09: 6.25 mg via INTRAVENOUS

## 2018-12-09 MED ORDER — ONDANSETRON HCL 4 MG/2ML IJ SOLN
INTRAMUSCULAR | Status: AC
  Filled 2018-12-09: qty 2

## 2018-12-09 MED ORDER — PROPOFOL 10 MG/ML IV BOLUS
INTRAVENOUS | Status: DC | PRN
  Administered 2018-12-09: 200 mg via INTRAVENOUS

## 2018-12-09 MED ORDER — LIDOCAINE HCL (CARDIAC) PF 100 MG/5ML IV SOSY
PREFILLED_SYRINGE | INTRAVENOUS | Status: DC | PRN
  Administered 2018-12-09: 100 mg via INTRAVENOUS

## 2018-12-09 MED ORDER — HYDROCODONE-ACETAMINOPHEN 5-325 MG PO TABS: 1.0000 | ORAL_TABLET | ORAL | 0 refills | Status: DC | PRN

## 2018-12-09 MED ORDER — DEXAMETHASONE SODIUM PHOSPHATE 10 MG/ML IJ SOLN
INTRAMUSCULAR | Status: AC
  Filled 2018-12-09: qty 1

## 2018-12-09 MED ORDER — PROMETHAZINE HCL 25 MG/ML IJ SOLN
INTRAMUSCULAR | Status: AC
  Filled 2018-12-09: qty 1

## 2018-12-09 MED ORDER — ROCURONIUM BROMIDE 10 MG/ML (PF) SYRINGE
PREFILLED_SYRINGE | INTRAVENOUS | Status: AC
  Filled 2018-12-09: qty 10

## 2018-12-09 MED ORDER — SUCCINYLCHOLINE CHLORIDE 200 MG/10ML IV SOSY
PREFILLED_SYRINGE | INTRAVENOUS | Status: AC
  Filled 2018-12-09: qty 10

## 2018-12-09 MED ORDER — LIDOCAINE 2% (20 MG/ML) 5 ML SYRINGE
INTRAMUSCULAR | Status: AC
  Filled 2018-12-09: qty 5

## 2018-12-09 MED ORDER — BUPIVACAINE HCL (PF) 0.25 % IJ SOLN
INTRAMUSCULAR | Status: AC
  Filled 2018-12-09: qty 30

## 2018-12-09 MED ORDER — DEXAMETHASONE SODIUM PHOSPHATE 10 MG/ML IJ SOLN
INTRAMUSCULAR | Status: DC | PRN
  Administered 2018-12-09: 10 mg via INTRAVENOUS

## 2018-12-09 MED ORDER — EPHEDRINE 5 MG/ML INJ
INTRAVENOUS | Status: AC
  Filled 2018-12-09: qty 10

## 2018-12-09 MED ORDER — 0.9 % SODIUM CHLORIDE (POUR BTL) OPTIME
TOPICAL | Status: DC | PRN
  Administered 2018-12-09: 1000 mL

## 2018-12-09 MED ORDER — OXYCODONE HCL 5 MG PO TABS: 5.0000 mg | ORAL_TABLET | Freq: Once | ORAL | Status: DC | PRN

## 2018-12-09 MED ORDER — ACETAMINOPHEN 10 MG/ML IV SOLN: 1000.0000 mg | Freq: Once | INTRAVENOUS | Status: DC | PRN

## 2018-12-09 MED ORDER — MIDAZOLAM HCL 2 MG/2ML IJ SOLN
INTRAMUSCULAR | Status: AC
  Filled 2018-12-09: qty 2

## 2018-12-09 MED ORDER — LACTATED RINGERS IV SOLN
INTRAVENOUS | Status: DC
  Administered 2018-12-09: 09:00:00 via INTRAVENOUS

## 2018-12-09 MED ORDER — FENTANYL CITRATE (PF) 100 MCG/2ML IJ SOLN
INTRAMUSCULAR | Status: DC | PRN
  Administered 2018-12-09 (×3): 50 ug via INTRAVENOUS

## 2018-12-09 MED ORDER — PHENYLEPHRINE HCL (PRESSORS) 10 MG/ML IV SOLN
INTRAVENOUS | Status: DC | PRN
  Administered 2018-12-09: 120 ug via INTRAVENOUS
  Administered 2018-12-09 (×2): 80 ug via INTRAVENOUS
  Administered 2018-12-09: 120 ug via INTRAVENOUS
  Administered 2018-12-09 (×2): 80 ug via INTRAVENOUS

## 2018-12-09 MED ORDER — OXYCODONE HCL 5 MG/5ML PO SOLN: 5.0000 mg | Freq: Once | ORAL | Status: DC | PRN

## 2018-12-09 SURGICAL SUPPLY — 25 items
BLADE SURG 15 STRL LF DISP TIS (BLADE) ×1 IMPLANT
BLADE SURG 15 STRL SS (BLADE) ×2
BNDG COHESIVE 1X5 TAN STRL LF (GAUZE/BANDAGES/DRESSINGS) ×3 IMPLANT
BNDG COHESIVE 2X5 TAN STRL LF (GAUZE/BANDAGES/DRESSINGS) ×3 IMPLANT
CLOSURE WOUND 1/4X4 (GAUZE/BANDAGES/DRESSINGS) ×1
COVER SURGICAL LIGHT HANDLE (MISCELLANEOUS) ×3 IMPLANT
COVER WAND RF STERILE (DRAPES) IMPLANT
ELECT PENCIL ROCKER SW 15FT (MISCELLANEOUS) ×3 IMPLANT
ELECT REM PT RETURN 15FT ADLT (MISCELLANEOUS) ×3 IMPLANT
GAUZE PETROLATUM 1 X8 (GAUZE/BANDAGES/DRESSINGS) ×6 IMPLANT
GLOVE BIO SURGEON STRL SZ7.5 (GLOVE) ×3 IMPLANT
GOWN STRL REUS W/TWL LRG LVL3 (GOWN DISPOSABLE) ×3 IMPLANT
KIT BASIN OR (CUSTOM PROCEDURE TRAY) ×3 IMPLANT
KIT TURNOVER KIT A (KITS) IMPLANT
NS IRRIG 1000ML POUR BTL (IV SOLUTION) IMPLANT
PACK BASIC VI WITH GOWN DISP (CUSTOM PROCEDURE TRAY) ×3 IMPLANT
SPONGE LAP 4X18 RFD (DISPOSABLE) ×3 IMPLANT
STRIP CLOSURE SKIN 1/4X4 (GAUZE/BANDAGES/DRESSINGS) ×2 IMPLANT
SUT CHROMIC 3 0 PS 2 (SUTURE) IMPLANT
SUT CHROMIC 3 0 SH 27 (SUTURE) ×6 IMPLANT
SUT SILK 2 0 (SUTURE)
SUT SILK 2-0 18XBRD TIE 12 (SUTURE) IMPLANT
SYR CONTROL 10ML LL (SYRINGE) IMPLANT
TUBING INSUFFLATION 10FT LAP (TUBING) IMPLANT
WATER STERILE IRR 1000ML POUR (IV SOLUTION) IMPLANT

## 2018-12-09 NOTE — Anesthesia Procedure Notes (Signed)
Procedure Name: Intubation Date/Time: 12/09/2018 9:50 AM Performed by: Glory Buff, CRNA Pre-anesthesia Checklist: Patient identified, Emergency Drugs available, Suction available and Patient being monitored Patient Re-evaluated:Patient Re-evaluated prior to induction Oxygen Delivery Method: Circle system utilized Preoxygenation: Pre-oxygenation with 100% oxygen Induction Type: IV induction Ventilation: Mask ventilation without difficulty Laryngoscope Size: Glidescope and 4 Grade View: Grade I Tube type: Oral Tube size: 7.5 mm Number of attempts: 1 Airway Equipment and Method: Video-laryngoscopy and Stylet Placement Confirmation: ETT inserted through vocal cords under direct vision,  positive ETCO2 and breath sounds checked- equal and bilateral Secured at: 21 cm Tube secured with: Tape Dental Injury: Teeth and Oropharynx as per pre-operative assessment

## 2018-12-09 NOTE — H&P (Signed)
CC/HPI: CC: Phimosis   HPI:  06/20/2018  48 year old male complains about nocturia 4-5 times a night. He started doxazosin and nocturia is down to 2-3 times a night. He is morbidly obese and has obstructive sleep apnea. He states he is not able to wear a CPAP. Therefore his sleep apnea as not being treated. Has mild daytime frequency but not nearly as much as the nighttime. Denies lower extremity swelling. Urinalysis is negative today. He also complains about a history of renal calculi. He did have a CT scan performed on 05/27/2018 that I personally reviewed. There was no evidence of renal calculi bilaterally.    08/02/2018  Patient tried oxybutynin. This gave him severe dry mouth. Therefore he stopped it. He is not interested in trying any other medication currently. He states that he is scheduled to see an ENT for possible surgical management of his obstructive sleep apnea. He does report intermittent gross hematuria. He feels like he passes a stone every 6 months or so. Urinalysis is negative today.   08/30/2018  Patient underwent a CT IVP. This revealed no urinary tract calculi. There were no renal masses. No filling defects along the course of the ureter or in the collecting system. No abnormalities in the bladder. Cystoscopy at the last visit was negative. He underwent a 24-hour urinalysis that revealed low urine volume as well as hyper-oxaluria with mild hyper uricosuria. He has no complaint today. He does have a history of Crohn's disease therefore explaining the potential reason for his hyper-oxaluria   09/04/2018  Patient presents today for evaluation of possible circumcision. For the past week or 2, he has been having difficulty retracting foreskin. It is mildly painful. He desires circumcision.     ALLERGIES: Lisinopril Zithromax TABS    MEDICATIONS: Cyclobenzaprine Hcl  Doxazosin Mesylate 8 mg tablet  Hydralazine Hcl  Metoclopramide Hcl  Olmesartan Medoxomil 20 mg tablet   Ondansetron Hcl 4 mg tablet  Stelara  Verapamil Er 180 mg tablet, extended release     GU PSH: Cystoscopy - 08/02/2018 Lysis Penil Circumic Lesion - 2011 Vasectomy - 2011      [place] Notes: Natural family planning, Surgery Penis Lysis/Excision Of Penile Post-Circ Adhesions, Surgery Of Male Genitalia Vasectomy   NON-GU PSH: No Non-GU PSH    GU PMH: Gross hematuria - 08/02/2018 BPH w/LUTS - 06/20/2018 History of urolithiasis - 06/20/2018 Nocturia - 06/20/2018 Penile congenital disorder, Penile anomalies - 2014      PMH Notes:  1898-07-03 00:00:00 - Note: Normal Routine History And Physical Adult   NON-GU PMH: Hyperoxaluria - 08/30/2018 Hyperuricosuria - 08/30/2018 Anxiety, Anxiety (Symptom) - 2014 Gastric ulcer, unspecified as acute or chronic, without hemorrhage or perforation, Gastric Ulcer - 2014 Personal history of other endocrine, nutritional and metabolic disease, History of hypercholesterolemia - 2014 Personal history of other specified conditions, History of heartburn - 2014 Crohns Disease GERD Gout Hypertension    FAMILY HISTORY: 2 daughters - Other 1 son - Other Diabetes - Father Family Health Status - Father alive at age 24 - 73 In Family Family Health Status - Mother's Age - Runs In Family Family Health Status Number - Runs In Family Gallbladder - Mother, Son   SOCIAL HISTORY: Marital Status: Married Preferred Language: English; Race: Black or African American Current Smoking Status: Patient does not smoke anymore. Has not smoked since 12/01/2017.   Tobacco Use Assessment Completed: Used Tobacco in last 30 days? Drinks 2 caffeinated drinks per day.     Notes: Alcohol Use, Tobacco  Use, Occupation:, Marital History - Currently Married, Caffeine Use   REVIEW OF SYSTEMS:    GU Review Male:   Patient denies frequent urination, hard to postpone urination, burning/ pain with urination, get up at night to urinate, leakage of urine, stream starts and stops, trouble  starting your stream, have to strain to urinate , erection problems, and penile pain.  Gastrointestinal (Upper):   Patient denies nausea, vomiting, and indigestion/ heartburn.  Gastrointestinal (Lower):   Patient denies diarrhea and constipation.  Constitutional:   Patient denies fever, night sweats, weight loss, and fatigue.  Skin:   Patient denies skin rash/ lesion and itching.  Eyes:   Patient denies blurred vision and double vision.  Ears/ Nose/ Throat:   Patient denies sore throat and sinus problems.  Hematologic/Lymphatic:   Patient denies swollen glands and easy bruising.  Cardiovascular:   Patient denies leg swelling and chest pains.  Respiratory:   Patient denies cough and shortness of breath.  Endocrine:   Patient denies excessive thirst.  Musculoskeletal:   Patient denies back pain and joint pain.  Neurological:   Patient denies headaches and dizziness.  Psychologic:   Patient denies depression and anxiety.   VITAL SIGNS:      09/04/2018 01:10 PM  BP 172/115 mmHg  Heart Rate 88 /min  Temperature 97.6 F / 36.4 C   GU PHYSICAL EXAMINATION:    Penis: Uncircumcised phallus. Some skin changes at the foreskin consistent with possible lichen sclerosus vs BXO   MULTI-SYSTEM PHYSICAL EXAMINATION:    Constitutional: Well-nourished. No physical deformities. Normally developed. Good grooming.  Respiratory: No labored breathing, no use of accessory muscles.   Cardiovascular: Normal temperature, adequate perfusion of extremities  Skin: No paleness, no jaundice  Neurologic / Psychiatric: Oriented to time, oriented to place, oriented to person. No depression, no anxiety, no agitation.  Gastrointestinal: No mass, no tenderness, no rigidity, morbidly obese abdomen.   Eyes: Normal conjunctivae. Normal eyelids.  Musculoskeletal: Normal gait and station of head and neck.     PAST DATA REVIEWED:  Source Of History:  Patient  Records Review:   Previous Patient Records   PROCEDURES:           Urinalysis w/Scope - 81001 Dipstick Dipstick Cont'd Micro  Color: Amber Bilirubin: Neg WBC/hpf: 0 - 5/hpf  Appearance: Clear Ketones: Neg RBC/hpf: 0 - 2/hpf  Specific Gravity: 1.030 Blood: Neg Bacteria: NS (Not Seen)  pH: 6.0 Protein: 2+ Cystals: NS (Not Seen)  Glucose: Neg Urobilinogen: 1.0 Casts: Hyaline    Nitrites: Neg Trichomonas: Not Present    Leukocyte Esterase: Neg Mucous: Present      Epithelial Cells: NS (Not Seen)      Yeast: NS (Not Seen)      Sperm: Not Present    Notes:      ASSESSMENT:      ICD-10 Details  1 GU:   Phimosis - N47.1   2   Balanitis - N48.1    PLAN:            Medications New Meds: Betamethasone Valerate 0.1 % ointment 1 drop Topical Daily   #30  0 Refill(s)            Document Letter(s):  Created for Patient: Clinical Summary         Notes:   Proceed with circumcision. He understands potential risks including but not limited to bleeding, infection, injury to surrounding structures, need for additional procedures, iatrogenic chordee, undesired cosmetic effect. He wishes to proceed.  Apply betamethasone lotion/appointment in the meantime.   Cc: Merri Ray, M.D.   Signed by Link Snuffer, III, M.D. on 09/05/18 at 10:00 AM (EST

## 2018-12-09 NOTE — Op Note (Signed)
Operative Note  Preoperative diagnosis:  1.  Phimosis  Postoperative diagnosis: 1.  Phimosis  Procedure(s): 1.  Circumcision  Surgeon: Link Snuffer, MD  Assistants: None  Anesthesia: General  Complications: None immediate  EBL: 20 cc  Specimens: 1.  Foreskin  Drains/Catheters: 1.  None  Intraoperative findings: Phimosis  Indication: 48YO male with phimosis desires circumcision.  Description of procedure:  The patient was identified and consent was obtained.  The patient was taken to the operating room and placed in the supine position.  The patient was placed under general anesthesia.  Perioperative antibiotics were administered.  The patient was placed in supine position.  Patient was prepped and draped in a standard sterile fashion and a timeout was performed.  Perioperative antibiotics were administered.  Foreskin adhesions were released with a hemostat.  The foreskin was then retracted and Betadine was applied to the inner portion of the foreskin the head of the penis.  A marking pen was used to mark out a circumferential line approximate 1 cm proximal to the corona.  A scalpel was used to sharply incise along this line.  The foreskin was then returned to its normal position and another circumferential line was made along the level of the corona with a marking pen.  A scalpel was again used to sharply incise along this line.  A straight clamp was used to clamp the midline of the foreskin.  This was then divided.  The excess foreskin was then removed with Bovie electrocautery taking great care not to come close to the glans.  The foreskin was collected for specimen.  Hemostasis was obtained with Bovie electrocautery.  The skin was then reapproximated with interrupted 3-0 chromic sutures and a dressing was applied.  This concluded the operation.  The patient tolerated the procedure well was stable postoperatively.  Plan: Patient will return in several weeks for a postoperative  check.

## 2018-12-09 NOTE — Anesthesia Postprocedure Evaluation (Signed)
Anesthesia Post Note  Patient: Craig Guzman.  Procedure(s) Performed: CIRCUMCISION ADULT (N/A )     Patient location during evaluation: PACU Anesthesia Type: General Level of consciousness: awake and alert Pain management: pain level controlled Vital Signs Assessment: post-procedure vital signs reviewed and stable Respiratory status: spontaneous breathing, nonlabored ventilation and respiratory function stable Cardiovascular status: blood pressure returned to baseline and stable Postop Assessment: no apparent nausea or vomiting Anesthetic complications: no    Last Vitals:  Vitals:   12/09/18 1115 12/09/18 1145  BP: 135/87 130/80  Pulse: 84 84  Resp: (!) 23 15  Temp: 36.9 C 36.9 C  SpO2: 100% 100%    Last Pain:  Vitals:   12/09/18 1115  TempSrc:   PainSc: 0-No pain                 Brennan Bailey

## 2018-12-09 NOTE — Transfer of Care (Signed)
Immediate Anesthesia Transfer of Care Note  Patient: Delaney Schnick.  Procedure(s) Performed: CIRCUMCISION ADULT (N/A )  Patient Location: PACU  Anesthesia Type:General  Level of Consciousness: awake, alert  and oriented  Airway & Oxygen Therapy: Patient Spontanous Breathing and Patient connected to face mask oxygen  Post-op Assessment: Report given to RN and Post -op Vital signs reviewed and stable  Post vital signs: Reviewed and stable  Last Vitals:  Vitals Value Taken Time  BP 145/128 12/09/2018 10:52 AM  Temp    Pulse 94 12/09/2018 10:54 AM  Resp 11 12/09/2018 10:54 AM  SpO2 96 % 12/09/2018 10:54 AM  Vitals shown include unvalidated device data.  Last Pain:  Vitals:   12/09/18 0900  TempSrc:   PainSc: 0-No pain      Patients Stated Pain Goal: 4 (97/58/83 2549)  Complications: No apparent anesthesia complications

## 2018-12-09 NOTE — Discharge Instructions (Signed)
Discharge instructions following circumcision  Call your doctor for:  Fever is greater than 100.5  Severe nausea or vomiting  Increasing pain not controlled by pain medication  Increasing redness or drainage from incisions  The number for questions or concerns is [phone]  Activity level: No lifting greater than 20 pounds (about equal to milk) for the next 2 weeks or until cleared to do so at follow-up appointment.  Otherwise activity as tolerated by comfort level.  Diet: May resume your regular diet as tolerated  Driving: No driving while still taking opiate pain medications (weight at least 6-8 hours after last dose).  No driving if you still sore from surgery as it may limit her ability to react quickly if necessary.   Shower/bath: May shower and get incision wet pad dry immediately following.  Do not scrub vigorously for the next 2-3 weeks.  Do not soak incision (ID soaking in bath or swimming) until told he may do so by Dr., as this may promote a wound infection.  Wound care: Remove the dressing in 2 days.  If it falls off, do not try to put it back on.  Just leave it alone.  If the dressing feels too tight, just go ahead and remove it.  After removal, you may shower but do not take any tub baths or go swimming.  You may apply bacitracin or Neosporin to the stitch line after the dressing comes off.  Follow-up appointments: Follow-up appointment will be scheduled with [facility] for a wound check.

## 2018-12-10 ENCOUNTER — Encounter (HOSPITAL_COMMUNITY): Payer: Self-pay | Admitting: Urology

## 2019-02-11 ENCOUNTER — Encounter: Payer: Self-pay | Admitting: Internal Medicine

## 2019-02-11 ENCOUNTER — Ambulatory Visit (INDEPENDENT_AMBULATORY_CARE_PROVIDER_SITE_OTHER): Payer: Managed Care, Other (non HMO) | Admitting: Internal Medicine

## 2019-02-11 VITALS — BP 146/76 | HR 108 | Temp 98.5°F | Ht 72.0 in | Wt 374.0 lb

## 2019-02-11 DIAGNOSIS — K911 Postgastric surgery syndromes: Secondary | ICD-10-CM | POA: Diagnosis not present

## 2019-02-11 DIAGNOSIS — K5000 Crohn's disease of small intestine without complications: Secondary | ICD-10-CM

## 2019-02-11 MED ORDER — CHOLESTYRAMINE LIGHT 4 G PO PACK: 4.0000 g | PACK | Freq: Two times a day (BID) | ORAL | 1 refills | Status: DC

## 2019-02-11 NOTE — Progress Notes (Signed)
Craig Guzman. 48 y.o. 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  At one point he had some inflammation in the right colon years ago.  Almost Craig Guzman is a consideration as a cause of diarrhea but I think looking at when he got started on this and what is going on in the clinical history I doubt he has almost Craig Guzman sprue-like illness.  However it is possible.  He is not on an adequate dumping syndrome diet and I have printed that information out again today.  I am inclined to try to treat him more aggressively for his dumping syndrome with diet and to try cholestyramine with lunch and supper.  He is fine with repeating an EGD and taking small bowel biopsies I did not do that late last year.  I was not thinking about olmesartan induced diarrhea so much.  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  He did see a dietitian but was dissatisfied.  I am not sure he has all the insight that he needs about how to adapt his diet at this time.  I am trying the cholestyramine and I repeated handing out dumping syndrome diet and try to explain what it is.  He really needs to eat 6 small meals a day separating liquids and solids.  Physical maneuvers such as lying down after eating.  Etc.  I will see if 1 of my surgery colleagues has any ideas about a good dietitian or a way to help him.  Perhaps 1 of the bariatric dietitians would be better he is returning on September 18 for follow-up and reassessment.    I appreciate the opportunity to care for this patient. CC: Craig Agreste, MD   Subjective:   Chief Complaint: Crohn's disease follow-up  HPI Craig Guzman returns for follow-up he was seen in February.  He continues to have problems with sharp generalized abdominal pains and urgent loose  stools that occur 5 to 10 minutes afterwards.  Pain is about 2 or 3 times a week loose stools most of the day.  He has somewhat formed stool but then they get progressively looser.  He is not really following a dumping diet.  He finds that rice spaghetti and a lot of meat bothers him.  He is trying to avoid those but he is eating tomatoes and salads.  He went back to work and then because of sinus surgery that he had to be slow to heal from use out again.  I had stopped his Stelara and 6-MP back in the winter because I thought maybe that was making him sick and causing problems.  An EGD late last year demonstrated granulomatous changes around his anastomosis but everything looked healthy.  Overall he has about 3-3 bowel movements a day 5 days out of the week.  His weight has gone up.  Still has nausea Zofran helps a little bit he will get gassy and belching at times.  Wt Readings from Last 3 Encounters:  02/11/19 (!) 374 lb (169.6 kg)  12/09/18 (!) 368 lb (166.9 kg)  11/27/18 (!) 368 lb (166.9 kg)    Allergies  Allergen Reactions   Zithromax [Azithromycin] Hypertension   Lisinopril Swelling    SWELLING REACTION UNSPECIFIED    Current Meds  Medication Sig   cyclobenzaprine (FLEXERIL)  10 MG tablet Take 10 mg by mouth 3 (three) times daily as needed (migraines).    doxazosin (CARDURA) 8 MG tablet Take 1 tablet (8 mg total) by mouth daily. (Patient taking differently: Take 8 mg by mouth at bedtime. )   olmesartan (BENICAR) 20 MG tablet Take 1 tablet (20 mg total) by mouth daily.   ondansetron (ZOFRAN-ODT) 4 MG disintegrating tablet TAKE 1 TABLET BY MOUTH EVERY 8 HOURS AS NEEDED FOR NAUSEA AND VOMITING (Patient taking differently: Take 4 mg by mouth every 8 (eight) hours as needed for nausea or vomiting. )   prochlorperazine (COMPAZINE) 25 MG suppository Place 1 suppository (25 mg total) rectally every 12 (twelve) hours as needed for nausea or vomiting.   verapamil (CALAN-SR) 180 MG CR tablet  Take 2 tablets (360 mg total) by mouth daily.   Past Medical History:  Diagnosis Date   Allergy    trees/ grasses/ animals/dust/mold   Biliary dyskinesia    Colloid thyroid nodule    Crohn's disease ([place])    Stomach, terminal ileum, cecum   Dyspnea    due to nasal /sinus congestion   Gastric ulcer    antral   GERD (gastroesophageal reflux disease)    History of kidney stones    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   History of migraine headaches    HTN (hypertension)    Kidney stone 09/20/2012   Migraine    Obesity    Pneumonia 02/27/2014   ED   Postsurgical dumping syndrome 02/14/2019   Sleep apnea    has cpap- does not wear    Past Surgical History:  Procedure Laterality Date   BALLOON DILATION N/A 12/06/2017   Procedure: BALLOON DILATION;  Surgeon: Gatha Mayer, MD;  Location: [facility];  Service: Endoscopy;  Laterality: N/A;   BIOPSY  06/21/2018   Procedure: BIOPSY;  Surgeon: Gatha Mayer, MD;  Location: [facility];  Service: Endoscopy;;   CHOLECYSTECTOMY  2011   [place]   CIRCUMCISION N/A 12/09/2018   Procedure: CIRCUMCISION ADULT;  Surgeon: Lucas Mallow, MD;  Location: [facility];  Service: Urology;  Laterality: N/A;   COLONOSCOPY  07/26/11   Crohn's colitis, ileitis   ESOPHAGOGASTRODUODENOSCOPY  05/04/11, 07/26/11   granulomatous gastritis - Crohn's   ESOPHAGOGASTRODUODENOSCOPY (EGD) WITH PROPOFOL N/A 03/28/2017   Procedure: ESOPHAGOGASTRODUODENOSCOPY (EGD) WITH PROPOFOL  with balloon dilation of duodenum;  Surgeon: [name], MD;  Location: [facility];  Service: Gastroenterology;  Laterality: N/A;   ESOPHAGOGASTRODUODENOSCOPY (EGD) WITH PROPOFOL N/A 12/06/2017   Procedure: ESOPHAGOGASTRODUODENOSCOPY (EGD) WITH PROPOFOL;  Surgeon: Gatha Mayer, MD;  Location: [facility];  Service: Endoscopy;  Laterality: N/A;   ESOPHAGOGASTRODUODENOSCOPY (EGD) WITH PROPOFOL N/A 06/21/2018   Procedure:  ESOPHAGOGASTRODUODENOSCOPY (EGD) WITH PROPOFOL;  Surgeon: Gatha Mayer, MD;  Location: [facility];  Service: Endoscopy;  Laterality: N/A;   ETHMOIDECTOMY N/A 11/27/2018   Procedure: ENDOSCOPIC TOTAL ETHMOIDECTOMY;  Surgeon: Leta Baptist, MD;  Location: [facility];  Service: ENT;  Laterality: N/A;   FLEXIBLE SIGMOIDOSCOPY     GASTROJEJUNOSTOMY  01/2018   MULTIPLE TOOTH EXTRACTIONS  2005   NASAL SEPTOPLASTY W/ TURBINOPLASTY Bilateral 11/27/2018   Procedure: NASAL SEPTOPLASTY WITH BILATERAL TURBINATE REDUCTION;  Surgeon: Leta Baptist, MD;  Location: [facility];  Service: ENT;  Laterality: Bilateral;   SHOULDER SURGERY     right   SINUS ENDO WITH FUSION N/A 11/27/2018   Procedure: SINUS ENDO WITH FUSION;  Surgeon: Leta Baptist, MD;  Location:  [facility];  Service: ENT;  Laterality: N/A;   UPPER GASTROINTESTINAL ENDOSCOPY     VASECTOMY     bilateral w/lysis of penile adhesions   Social History   Social History Narrative   Married. Education: [employer].    Lives at home w/ his wife and kids   Right-handed   Caffeine: 3 sodas per day   family history includes Asthma in his mother; Colon polyps in his father; Diabetes in his father; Heart disease in his maternal grandmother; Hypertension in his father and mother.   Review of Systems As per HPI  Objective:   Physical Exam BP (!) 146/76    Pulse (!) 108    Temp 98.5 F (36.9 C) (Temporal)    Ht 6' (1.829 m)    Wt (!) 374 lb (169.6 kg)    BMI 50.72 kg/m  Well-developed well-nourished obese black man in no acute distress Eyes anicteric Normal heart and lung sounds Abdomen is obese soft minimally tender in the epigastric area bowel sounds present surgical scars present  Data reviewed see HPI recent and past GI notes primary care notes imaging and labs reviewed

## 2019-02-11 NOTE — Patient Instructions (Signed)
We have sent the following medications to your pharmacy for you to pick up at your convenience: Cholestyramine, take with lunch and dinner   We are giving you reading information to try and follow as best you can about eating for dumping syndrome.   We will see you back on 03/21/2019 at 10:50AM.    I appreciate the opportunity to care for you. Silvano Rusk, MD, [facility]

## 2019-02-11 NOTE — Assessment & Plan Note (Addendum)
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  At one point he had some inflammation in the right colon years ago.  Almost Charisse March is a consideration as a cause of diarrhea but I think looking at when he got started on this and what is going on in the clinical history I doubt he has almost [place] sprue-like illness.  However it is possible.  He is not on an adequate dumping syndrome diet and I have printed that information out again today.  I am inclined to try to treat him more aggressively for his dumping syndrome with diet and to try cholestyramine with lunch and supper.  He is fine with repeating an EGD and taking small bowel biopsies I did not do that late last year.  I was not thinking about olmesartan induced diarrhea so much.

## 2019-02-14 ENCOUNTER — Encounter: Payer: Self-pay | Admitting: Internal Medicine

## 2019-02-14 DIAGNOSIS — K911 Postgastric surgery syndromes: Secondary | ICD-10-CM

## 2019-02-14 HISTORY — DX: Postgastric surgery syndromes: K91.1

## 2019-02-14 NOTE — Assessment & Plan Note (Addendum)
I think this is what is happening in him.  He did see a dietitian but was dissatisfied.  I am not sure he has all the insight that he needs about how to adapt his diet at this time.  I am trying the cholestyramine and I repeated handing out dumping syndrome diet and try to explain what it is.  He really needs to eat 6 small meals a day separating liquids and solids.  Physical maneuvers such as lying down after eating.  Etc.  I will see if 1 of my surgery colleagues has any ideas about a good dietitian or a way to help him.  Perhaps 1 of the bariatric dietitians would be better he is returning on September 18 for follow-up and reassessment.

## 2019-02-17 ENCOUNTER — Ambulatory Visit (INDEPENDENT_AMBULATORY_CARE_PROVIDER_SITE_OTHER): Payer: Managed Care, Other (non HMO) | Admitting: Otolaryngology

## 2019-02-17 DIAGNOSIS — J322 Chronic ethmoidal sinusitis: Secondary | ICD-10-CM

## 2019-02-17 DIAGNOSIS — J338 Other polyp of sinus: Secondary | ICD-10-CM | POA: Diagnosis not present

## 2019-02-17 DIAGNOSIS — J3489 Other specified disorders of nose and nasal sinuses: Secondary | ICD-10-CM | POA: Diagnosis not present

## 2019-02-28 ENCOUNTER — Telehealth: Payer: Self-pay | Admitting: Family Medicine

## 2019-02-28 NOTE — Telephone Encounter (Signed)
Form received from [employer], disability Emergency planning/management officer regarding restriction form completion.  I do not see where we have discussed specifics regarding that form recently.  Please schedule appointment to discuss that form and potential restrictions further.  Thanks.

## 2019-03-06 ENCOUNTER — Ambulatory Visit (INDEPENDENT_AMBULATORY_CARE_PROVIDER_SITE_OTHER): Payer: Self-pay | Admitting: Family Medicine

## 2019-03-06 ENCOUNTER — Other Ambulatory Visit: Payer: Self-pay

## 2019-03-06 ENCOUNTER — Encounter: Payer: Self-pay | Admitting: Family Medicine

## 2019-03-06 VITALS — BP 171/97 | HR 106 | Temp 98.2°F | Resp 18 | Wt 371.2 lb

## 2019-03-06 DIAGNOSIS — I10 Essential (primary) hypertension: Secondary | ICD-10-CM

## 2019-03-06 DIAGNOSIS — R519 Headache, unspecified: Secondary | ICD-10-CM

## 2019-03-06 DIAGNOSIS — K50919 Crohn's disease, unspecified, with unspecified complications: Secondary | ICD-10-CM

## 2019-03-06 DIAGNOSIS — Z23 Encounter for immunization: Secondary | ICD-10-CM

## 2019-03-06 DIAGNOSIS — R51 Headache: Secondary | ICD-10-CM

## 2019-03-06 NOTE — Progress Notes (Signed)
Subjective:    Patient ID: Craig Guzman., male    DOB: 01-02-1971, 48 y.o.   MRN: [id number]  HPI Craig Guzman. Guzman a 48 y.o. male Presents today for: Chief Complaint  Patient presents with  . crohn's diseae    Patient Guzman here to discuss disability form for his crohn's disease and IBS. Patient was told he needed an appt cause he had not been seen in years before we can fill out forms   History of Crohn's disease, postsurgical dumping syndrome, followed by [name], with ongoing care.  Has had multiple flares of symptoms in the past including diarrhea, intractable nausea and vomiting, epigastric and abdominal pain.  Disability paperwork has been previously completed by his gastroenterologist.  Recently form was sent to me for completion - including physical limitations.  With dumping syndrome - BM's 3-4 times per day for about 5 days per week, then nothing on other days. With each episode, feels generalized weakness, fatigue for 15-20 minutes afterward. (about 47mn of time total per episode) some days less frequent.  No other physical limitations or deficits.  New med - cholestyramine BID prescribed - feels like it blocked him up for about 5 days. Trying to space this out now to see if more balanced,   Out of work since May due to [employer]   Hypertension: BP Readings from Last 3 Encounters:  03/06/19 (!) 171/97  02/11/19 (!) 146/76  12/09/18 130/80   Lab Results  Component Value Date   CREATININE 0.96 12/04/2018  missed dose of med few days ago.  Home readings 135-140/85-86.  Still taking olmesartan 20 mg/day, doxazosin 8 mg at bedtime, hydralazine 50 mg 3 times per day, and verapamil 180 mg 2/day. HA last night - did not take maxalt - resolved in 1 hr.   Review of Systems Per HPI.  Eyes: Negative for visual disturbance.  Respiratory: Negative for cough, chest tightness and shortness of breath.   Cardiovascular: Negative for chest pain, palpitations and leg swelling.  Neurological: Negative for dizziness, light-headedness.       Objective:   Physical Exam Vitals signs reviewed.  Constitutional:      General: He Guzman not in acute distress.    Appearance: Normal appearance. He Guzman well-developed. He Guzman obese. He Guzman not diaphoretic.  HENT:     Head: Normocephalic and atraumatic.  Eyes:     Pupils: Pupils are equal, round, and reactive to light.  Neck:     Vascular: No carotid bruit or JVD.  Cardiovascular:     Rate and Rhythm: Normal rate and regular rhythm.     Heart sounds: Normal heart sounds. No murmur.  Pulmonary:     Effort: Pulmonary effort Guzman normal.     Breath sounds: Normal breath sounds. No rales.  Musculoskeletal: Normal range of motion.        General: No deformity.  Skin:    General: Skin Guzman warm and dry.  Neurological:     Mental Status: He Guzman alert and oriented to person, place, and time.    Vitals:   03/06/19 1158  BP: (!) 171/97  Pulse: (!) 106  Resp: 18  Temp: 98.2 F (36.8 C)  TempSrc: Oral  SpO2: 98%  Weight: (!) 371 lb 3.2 oz (168.4 kg)        Assessment & Plan:  PLadamien Rammel Guzman a 48y.o. male Essential hypertension  -Home readings improved compared to an office but still borderline.  Asymptomatic at  present.  Recommended home monitoring and RTC precautions if elevated.  No med changes for now. Need for prophylactic vaccination and inoculation against influenza - Plan: Flu Vaccine QUAD 6+ mos PF IM (Fluarix Quad PF)  Crohn's disease with complication, unspecified gastrointestinal tract location [facility])  -Followed by gastroenterology.  Suspected dumping syndrome on top of Crohn's disease.  Infection as above for will need to have a bowel movement 3-4 times per day, some exhaustion afterwards, about 30 minutes total per episode.  Denies any specific muscular weakness or debility.    -Paperwork noted, but it appears similar paperwork has been completed by his gastroenterologist in the past.  Will have staff check  with requesting facility to determine if they have received notes from GI or if I need to complete that form.   Nonintractable episodic headache, unspecified headache type  -Asymptomatic at present.  Noted last night, but did not require Maxalt.  History of migraine headaches.  RTC/ER precautions if recurrence or acute worsening  No orders of the defined types were placed in this encounter.  Patient Instructions   Blood pressure running a little high here today.  Continue to monitor levels at home.  If those run over 140/90, return to discuss other medication changes.  I will discuss with your gastroenterologist the disability forms, but should have those completed within the next 1 week.    As headache Guzman improved today, no new changes.  If you continue to have headaches or any worsening symptoms return for recheck.  Thank you for coming in today.   If you have lab work done today you will be contacted with your lab results within the next 2 weeks.  If you have not heard from [place] then please contact us. The fastest way to get your results Guzman to register for My Chart.   IF you received an x-ray today, you will receive an invoice from [facility]. Please contact [facility] at [phone] with questions or concerns regarding your invoice.   IF you received labwork today, you will receive an invoice from [facility]. Please contact [facility] at [phone] with questions or concerns regarding your invoice.   Our billing staff will not be able to assist you with questions regarding bills from these companies.  You will be contacted with the lab results as soon as they are available. The fastest way to get your results Guzman to activate your My Chart account. Instructions are located on the last page of this paperwork. If you have not heard from [place] regarding the results in 2 weeks, please contact this office.       Signed,   Merri Ray, MD [facility].  03/07/19 11:59 AM

## 2019-03-06 NOTE — Patient Instructions (Addendum)
Blood pressure running a little high here today.  Continue to monitor levels at home.  If those run over 140/90, return to discuss other medication changes.  I will discuss with your gastroenterologist the disability forms, but should have those completed within the next 1 week.    As headache is improved today, no new changes.  If you continue to have headaches or any worsening symptoms return for recheck.  Thank you for coming in today.   If you have lab work done today you will be contacted with your lab results within the next 2 weeks.  If you have not heard from [place] then please contact us. The fastest way to get your results is to register for My Chart.   IF you received an x-ray today, you will receive an invoice from [facility]. Please contact [facility] at [phone] with questions or concerns regarding your invoice.   IF you received labwork today, you will receive an invoice from [facility]. Please contact [facility] at [phone] with questions or concerns regarding your invoice.   Our billing staff will not be able to assist you with questions regarding bills from these companies.  You will be contacted with the lab results as soon as they are available. The fastest way to get your results is to activate your My Chart account. Instructions are located on the last page of this paperwork. If you have not heard from [place] regarding the results in 2 weeks, please contact this office.

## 2019-03-07 ENCOUNTER — Encounter: Payer: Self-pay | Admitting: Family Medicine

## 2019-03-21 ENCOUNTER — Ambulatory Visit: Payer: Self-pay | Admitting: Internal Medicine

## 2019-03-21 ENCOUNTER — Other Ambulatory Visit: Payer: Self-pay

## 2019-03-21 ENCOUNTER — Encounter: Payer: Self-pay | Admitting: Internal Medicine

## 2019-03-21 VITALS — BP 138/88 | HR 110 | Temp 97.8°F | Ht 72.0 in | Wt 372.1 lb

## 2019-03-21 DIAGNOSIS — K911 Postgastric surgery syndromes: Secondary | ICD-10-CM

## 2019-03-21 DIAGNOSIS — Z598 Other problems related to housing and economic circumstances: Secondary | ICD-10-CM

## 2019-03-21 DIAGNOSIS — K591 Functional diarrhea: Secondary | ICD-10-CM

## 2019-03-21 DIAGNOSIS — Z5989 Other problems related to housing and economic circumstances: Secondary | ICD-10-CM

## 2019-03-21 DIAGNOSIS — G43D0 Abdominal migraine, not intractable: Secondary | ICD-10-CM

## 2019-03-21 DIAGNOSIS — K50018 Crohn's disease of small intestine with other complication: Secondary | ICD-10-CM

## 2019-03-21 MED ORDER — RIZATRIPTAN BENZOATE 10 MG PO TBDP: 10.0000 mg | ORAL_TABLET | Freq: Three times a day (TID) | ORAL | 3 refills | Status: DC | PRN

## 2019-03-21 NOTE — Assessment & Plan Note (Addendum)
Not active from imaging and endoscopy though biopsy showed granulomas no overt evidence of disease on last EGD.  It is possible he has active disease in the midst of all of his problems but he is in observation right now he had side effects from therapy and now that he is uninsured Biologics and other treatments are not an option right now anyway.

## 2019-03-21 NOTE — Assessment & Plan Note (Signed)
Thought in part related to dumping syndrome seems improved

## 2019-03-21 NOTE — Patient Instructions (Addendum)
We have sent the following medications to your pharmacy for you to pick up at your convenience: rizatriptan.   Follow up with [name] as needed.

## 2019-03-21 NOTE — Assessment & Plan Note (Signed)
He is working on [employer] I believe.  I think pursuing disability as well.

## 2019-03-21 NOTE — Assessment & Plan Note (Addendum)
I wonder if some of these abdominal pain episodes or perhaps many are all are an abdominal migraine.  I have refilled his rizatriptan and want him to try when he gets another attack.  He is to let me know how this goes through my chart or phone follow-up or scheduling an appointment if that is necessary

## 2019-03-21 NOTE — Progress Notes (Signed)
Craig Guzman. 48 y.o. 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  It is possible he has active disease in the midst of all of his problems but he is in observation right now he had side effects from therapy and now that he is uninsured Biologics and other treatments are not an option right now anyway.  Postsurgical dumping syndrome Keep working on diet.  I have given him information kindly supplied by 1 of the bariatric dietitians for inflammatory bowel disease eating as well which she thinks would be helpful.  Continue the cholestyramine, I think he may need smaller amounts more frequently and we discussed trying that.  He is right to hold it if constipated.  Follow-up through my chart at this point he will let me know how things are going.  Abdominal migraine, not intractable ? I wonder if some of these abdominal pain episodes or perhaps many are all are an abdominal migraine.  I have refilled his rizatriptan and want him to try when he gets another attack.  He is to let me know how this goes through my chart or phone follow-up or scheduling an appointment if that is necessary  Diarrhea Thought in part related to dumping syndrome seems improved  Uninsured He is working on [employer] I believe.  I think pursuing disability as well.      Subjective:   Chief Complaint: Follow-up dumping syndrome Crohn's disease  HPI Craig Guzman is here with his wife [name], he thinks he is a little better but he continues to have these episodes of severe abdominal pain.  They come on without rhyme or reason and can be quite disabling. He tried cholestyramine, after 1 dose or 2 doses early on he did not move his bowels for several days though he has been taking it about once a week instead.  About the fourth or fifth day he will then have softer  stools.  His stools are definitely formed so the diarrhea is less.  He is only vomited about twice in 6 months and that is much better as well.  Unfortunately he was terminated from his job probably due to multiple absences, with his health problems.  He is slowly working on a dumping syndrome diet but admittedly struggling with that.  If he does overeat he has intense abdominal pressure.  Wt Readings from Last 3 Encounters:  03/21/19 (!) 372 lb 2 oz (168.8 kg)  03/06/19 (!) 371 lb 3.2 oz (168.4 kg)  02/11/19 (!) 374 lb (169.6 kg)    Allergies  Allergen Reactions  . Zithromax [Azithromycin] Hypertension  . Lisinopril Swelling    SWELLING REACTION UNSPECIFIED    Current Meds  Medication Sig  . cholestyramine light (PREVALITE) 4 g packet Take 1 packet (4 g total) by mouth 2 (two) times daily with a meal. Lunch and supper  . cyclobenzaprine (FLEXERIL) 10 MG tablet Take 10 mg by mouth 3 (three) times daily as needed (migraines).   Marland Kitchen doxazosin (CARDURA) 8 MG tablet Take 1 tablet (8 mg total) by mouth daily. (Patient taking differently: Take 8 mg by mouth at bedtime. )  . olmesartan (BENICAR) 20 MG tablet Take 1 tablet (20 mg total) by mouth daily.  . ondansetron (ZOFRAN-ODT) 4 MG disintegrating tablet TAKE 1 TABLET BY MOUTH EVERY 8 HOURS AS NEEDED FOR NAUSEA AND VOMITING (Patient taking differently: Take 4  mg by mouth every 8 (eight) hours as needed for nausea or vomiting. )  . prochlorperazine (COMPAZINE) 25 MG suppository Place 1 suppository (25 mg total) rectally every 12 (twelve) hours as needed for nausea or vomiting.  . rizatriptan (MAXALT-MLT) 10 MG disintegrating tablet TAKE 1 TABLET (10 MG TOTAL) BY MOUTH 3 (THREE) TIMES DAILY AS NEEDED FOR MIGRAINE.  Marland Kitchen verapamil (CALAN-SR) 180 MG CR tablet Take 2 tablets (360 mg total) by mouth daily.   Past Medical History:  Diagnosis Date  . Allergy    trees/ grasses/ animals/dust/mold  . Biliary dyskinesia   . Colloid thyroid nodule   . Crohn's  disease ([place])    Stomach, terminal ileum, cecum  . Crohn's disease ([place])   . Dyspnea    due to nasal /sinus congestion  . Gastric ulcer    antral  . GERD (gastroesophageal reflux disease)   . 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 History of migraine headaches   . HTN (hypertension)   . Kidney stone 09/20/2012  . Migraine   . Obesity   . Pneumonia 02/27/2014   ED  . Postsurgical dumping syndrome 02/14/2019  . Sleep apnea    has cpap- does not wear    Past Surgical History:  Procedure Laterality Date  . BALLOON DILATION N/A 12/06/2017   Procedure: BALLOON DILATION;  Surgeon: Gatha Mayer, MD;  Location: [facility];  Service: Endoscopy;  Laterality: N/A;  . BIOPSY  06/21/2018   Procedure: BIOPSY;  Surgeon: Gatha Mayer, MD;  Location: [facility];  Service: Endoscopy;;  . CHOLECYSTECTOMY  2011   [place]  . CIRCUMCISION N/A 12/09/2018   Procedure: CIRCUMCISION ADULT;  Surgeon: Lucas Mallow, MD;  Location: [facility];  Service: Urology;  Laterality: N/A;  . COLONOSCOPY  07/26/11   Crohn's colitis, ileitis  . ESOPHAGOGASTRODUODENOSCOPY  05/04/11, 07/26/11   granulomatous gastritis - Crohn's  . ESOPHAGOGASTRODUODENOSCOPY (EGD) WITH PROPOFOL N/A 03/28/2017   Procedure: ESOPHAGOGASTRODUODENOSCOPY (EGD) WITH PROPOFOL  with balloon dilation of duodenum;  Surgeon: [name], MD;  Location: [facility];  Service: Gastroenterology;  Laterality: N/A;  . ESOPHAGOGASTRODUODENOSCOPY (EGD) WITH PROPOFOL N/A 12/06/2017   Procedure: ESOPHAGOGASTRODUODENOSCOPY (EGD) WITH PROPOFOL;  Surgeon: Gatha Mayer, MD;  Location: [facility];  Service: Endoscopy;  Laterality: N/A;  . ESOPHAGOGASTRODUODENOSCOPY (EGD) WITH PROPOFOL N/A 06/21/2018   Procedure: ESOPHAGOGASTRODUODENOSCOPY (EGD) WITH PROPOFOL;  Surgeon: Gatha Mayer, MD;  Location: [facility];  Service: Endoscopy;  Laterality: N/A;  . ETHMOIDECTOMY N/A 11/27/2018   Procedure:  ENDOSCOPIC TOTAL ETHMOIDECTOMY;  Surgeon: Leta Baptist, MD;  Location: [facility];  Service: ENT;  Laterality: N/A;  . FLEXIBLE SIGMOIDOSCOPY    . GASTROJEJUNOSTOMY  01/2018  . MULTIPLE TOOTH EXTRACTIONS  2005  . NASAL SEPTOPLASTY W/ TURBINOPLASTY Bilateral 11/27/2018   Procedure: NASAL SEPTOPLASTY WITH BILATERAL TURBINATE REDUCTION;  Surgeon: Leta Baptist, MD;  Location: [facility];  Service: ENT;  Laterality: Bilateral;  . SHOULDER SURGERY     right  . SINUS ENDO WITH FUSION N/A 11/27/2018   Procedure: SINUS ENDO WITH FUSION;  Surgeon: Leta Baptist, MD;  Location: [facility];  Service: ENT;  Laterality: N/A;  . UPPER GASTROINTESTINAL ENDOSCOPY    . VASECTOMY     bilateral w/lysis of penile adhesions   Social History   Social History Narrative   Married. Education: [employer].    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   Review of Systems  As above Objective:   Physical Exam BP 138/88 (BP Location: Left Arm, Patient Position: Sitting, Cuff Size: Normal) Comment (BP Location): forarm  Pulse (!) 110   Temp 97.8 F (36.6 C) (Oral)   Ht 6' (1.829 m)   Wt (!) 372 lb 2 oz (168.8 kg)   BMI 50.47 kg/m  Obese pleasant no acute distress

## 2019-03-21 NOTE — Assessment & Plan Note (Signed)
Keep working on diet.  I have given him information kindly supplied by 1 of the bariatric dietitians for inflammatory bowel disease eating as well which she thinks would be helpful.  Continue the cholestyramine, I think he may need smaller amounts more frequently and we discussed trying that.  He is right to hold it if constipated.  Follow-up through my chart at this point he will let me know how things are going.

## 2019-04-17 ENCOUNTER — Telehealth: Payer: Self-pay | Admitting: Internal Medicine

## 2019-04-17 NOTE — Telephone Encounter (Signed)
Pt wants to know if we received his [employer] form for his disability. He states that he spoke with med records and they told him that they faxed over form last Monday for [name] approval/signature. He wants to know the status of that.

## 2019-04-17 NOTE — Telephone Encounter (Signed)
Have you seen any disability claim forms?

## 2019-04-18 MED ORDER — COLESTIPOL HCL 1 G PO TABS: 1.0000 g | ORAL_TABLET | Freq: Two times a day (BID) | ORAL | 0 refills | Status: DC

## 2019-04-18 NOTE — Telephone Encounter (Signed)
I called Craig Guzman about his disability form.  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   He has continued to have some episodic nausea and vomiting, he had one spell where he could not go to class he vomited he laid down and then after a while took his migraine treatment but it took 7 or 8 hours for it to resolve.  I advised him to take it sooner in the course of an attack.  He is struggling to measure out the cholestyramine such that he does not get terribly constipated.  I will try colestipol tablets instead we will have him try 1 every other day   He should see me in January.

## 2019-04-18 NOTE — Telephone Encounter (Signed)
Yes - I need to call him later for some clarification so no need for you to call him back but [facility]

## 2019-04-21 ENCOUNTER — Ambulatory Visit (INDEPENDENT_AMBULATORY_CARE_PROVIDER_SITE_OTHER): Payer: Self-pay | Admitting: Otolaryngology

## 2019-04-21 ENCOUNTER — Other Ambulatory Visit: Payer: Self-pay

## 2019-04-21 DIAGNOSIS — J322 Chronic ethmoidal sinusitis: Secondary | ICD-10-CM

## 2019-04-21 DIAGNOSIS — J338 Other polyp of sinus: Secondary | ICD-10-CM

## 2019-04-21 DIAGNOSIS — J343 Hypertrophy of nasal turbinates: Secondary | ICD-10-CM

## 2019-04-29 ENCOUNTER — Telehealth: Payer: Self-pay | Admitting: Internal Medicine

## 2019-04-29 NOTE — Telephone Encounter (Signed)
[name]  Dr. Gracelyn Nurse of [employer] called and asked if you could call him regarding this pts Disability w/Crohns Disease.  Please call him @ [phone]

## 2019-04-30 NOTE — Telephone Encounter (Signed)
[name]  Dr. Gracelyn Nurse call regarding this msg.

## 2019-05-01 NOTE — Telephone Encounter (Signed)
I have called 3 x - 2x yesterday and left messages and 1x today (no answer)

## 2019-05-02 NOTE — Telephone Encounter (Signed)
Spoke to the physician and answered his ?

## 2019-05-03 ENCOUNTER — Other Ambulatory Visit: Payer: Self-pay | Admitting: Family Medicine

## 2019-05-03 DIAGNOSIS — I10 Essential (primary) hypertension: Secondary | ICD-10-CM

## 2019-05-12 ENCOUNTER — Encounter: Payer: Self-pay | Admitting: Family Medicine

## 2019-05-12 ENCOUNTER — Ambulatory Visit (INDEPENDENT_AMBULATORY_CARE_PROVIDER_SITE_OTHER): Payer: Self-pay | Admitting: Family Medicine

## 2019-05-12 ENCOUNTER — Other Ambulatory Visit: Payer: Self-pay

## 2019-05-12 VITALS — BP 132/71 | HR 57 | Temp 99.0°F | Wt 365.6 lb

## 2019-05-12 DIAGNOSIS — R61 Generalized hyperhidrosis: Secondary | ICD-10-CM

## 2019-05-12 DIAGNOSIS — R42 Dizziness and giddiness: Secondary | ICD-10-CM

## 2019-05-12 DIAGNOSIS — R002 Palpitations: Secondary | ICD-10-CM

## 2019-05-12 DIAGNOSIS — R9431 Abnormal electrocardiogram [ECG] [EKG]: Secondary | ICD-10-CM

## 2019-05-12 DIAGNOSIS — E869 Volume depletion, unspecified: Secondary | ICD-10-CM

## 2019-05-12 LAB — POCT CBC
Granulocyte percent: 58.9 %G (ref 37–80)
HCT, POC: 42.1 % — AB (ref 29–41)
Hemoglobin: 13.8 g/dL (ref 11–14.6)
Lymph, poc: 1.5 (ref 0.6–3.4)
MCH, POC: 26.2 pg — AB (ref 27–31.2)
MCHC: 32.9 g/dL (ref 31.8–35.4)
MCV: 79.5 fL (ref 76–111)
MID (cbc): 0.4 (ref 0–0.9)
MPV: 7.8 fL (ref 0–99.8)
POC Granulocyte: 2.8 (ref 2–6.9)
POC LYMPH PERCENT: 31.6 %L (ref 10–50)
POC MID %: 9.5 %M (ref 0–12)
Platelet Count, POC: 160 10*3/uL (ref 142–424)
RBC: 5.29 M/uL (ref 4.69–6.13)
RDW, POC: 15.7 %
WBC: 4.7 10*3/uL (ref 4.6–10.2)

## 2019-05-12 LAB — GLUCOSE, POCT (MANUAL RESULT ENTRY): POC Glucose: 85 mg/dl (ref 70–99)

## 2019-05-12 NOTE — Patient Instructions (Addendum)
Small sips of fluids frequently as we discussed.  No medication changes for now, but will check some lab work and discuss further in 2 days.  There were a couple of possible changes in the EKG from previous reading, so would like you to meet with cardiology.  If any chest pain, acute worsening of symptoms go to the emergency room.  Return to the clinic or go to the nearest emergency room if any of your symptoms worsen or new symptoms occur.    If you have lab work done today you will be contacted with your lab results within the next 2 weeks.  If you have not heard from [place] then please contact us. The fastest way to get your results is to register for My Chart.   IF you received an x-ray today, you will receive an invoice from [facility]. Please contact [facility] at [phone] with questions or concerns regarding your invoice.   IF you received labwork today, you will receive an invoice from [facility]. Please contact [facility] at [phone] with questions or concerns regarding your invoice.   Our billing staff will not be able to assist you with questions regarding bills from these companies.  You will be contacted with the lab results as soon as they are available. The fastest way to get your results is to activate your My Chart account. Instructions are located on the last page of this paperwork. If you have not heard from [place] regarding the results in 2 weeks, please contact this office.

## 2019-05-12 NOTE — Progress Notes (Signed)
Subjective:  Patient ID: Craig Clark., male    DOB: 07/05/1970  Age: 48 y.o. MRN: [id number]  CC:  Chief Complaint  Patient presents with  . Dizziness    dizziness, nausea, vomiting, and motion sickness thats been going on for 2 weeks now. Not sure if this is all realted but had nasal surgery in June but finish prednison 1 week ago.     HPI Craig Guzman. presents for   Dizziness: Has had recurrent episodes, of vomiting in past.   Past 2 weeks has episodes of dizziness, wavy feeling, then vomiting. more with being in a vehicle - pretty much every time now. notices at some point during the drive - driving and as passenger. 6-7 episodes in past 2 weeks.  No history of vertigo, no prior similar sx's.  Lightheaded with standing at times, not with turning head.   Glucose 114 in June. Nasal surgery in May - Dr. Benjamine Mola.  Not been healing well - prednisone started 3 weeks ago - stopped 1 week.  No change in thirst/urination.   Gas pains last week. No chest pain. More abdominal cramps at times - similar to prior discussion with GI. Helped some with rizatriptan.   Heart beating harder at times - 2 episodes in past month, no associated lightheadedness, or chest pain but was short of breath.  Lasted 1-2 minutes.   No focal weakness, slurred speech, or facial droop.   Under the care of [name] for history of Crohn's disease with dumping syndrome.  Recurrent episodes of abdominal pain, possible abdominal migraine.  Option of rizatriptan discussed September 18.  Diet was also discussed with handout given on IBD diet.  Continued on cholestyramine.  Drinking water throughout the day, but trouble keeping down at times. May be drinking too large of amount at times.   Some night sweats - more sweating past 3-6 months.   Diarrhea 2-3 times per day for 2-3 days then stops for a day or two.  (today is first day of sx's).    History Patient Active Problem List   Diagnosis Date Noted  .  Abdominal migraine, not intractable ? 03/21/2019  . Uninsured 03/21/2019  . Postsurgical dumping syndrome 02/14/2019  . S/P functional endoscopic sinus surgery 11/27/2018  . Abdominal pain, epigastric   . Angioedema 04/19/2018  . Intractable vomiting with nausea   . Cigarette nicotine dependence without complication 03/00/9233  . Shifting sleep-work schedule, affecting sleep 05/15/2016  . Sleep deprivation 05/15/2016  . Sleep related hypoventilation in conditions classified elsewhere 05/15/2016  . Sleep related headaches 05/15/2016  . Morbid obesity ([place]) 09/19/2013  . Diarrhea 07/10/2013  . 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 Hypertension 11/26/2012  . Gastroparesis??? 12/08/2011  . Common migraine without aura 12/08/2011  . Long-term use of immunosuppressant medication 08/08/2011  . Gastroduodenal Crohn's disease ([place]) 05/17/2011  . GERD (gastroesophageal reflux disease) 04/21/2011   Past Medical History:  Diagnosis Date  . Allergy    trees/ grasses/ animals/dust/mold  . Biliary dyskinesia   . Colloid thyroid nodule   . Crohn's disease ([place])    Stomach, terminal ileum, cecum  . Crohn's disease ([place])   . Dyspnea    due to nasal /sinus congestion  . Gastric ulcer    antral  . GERD (gastroesophageal reflux disease)   . 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 History of migraine headaches   . HTN (hypertension)   .  Kidney stone 09/20/2012  . Migraine   . Obesity   . Pneumonia 02/27/2014   ED  . Postsurgical dumping syndrome 02/14/2019  . Sleep apnea    has cpap- does not wear    Past Surgical History:  Procedure Laterality Date  . BALLOON DILATION N/A 12/06/2017   Procedure: BALLOON DILATION;  Surgeon: Gatha Mayer, MD;  Location: [facility];  Service: Endoscopy;  Laterality: N/A;  . BIOPSY  06/21/2018   Procedure: BIOPSY;  Surgeon: Gatha Mayer, MD;  [facility];  Service: Endoscopy;;  . CHOLECYSTECTOMY   2011   [facility]  . CIRCUMCISION N/A 12/09/2018   Procedure: CIRCUMCISION ADULT;  Surgeon: Lucas Mallow, MD;  [facility];  Service: Urology;  Laterality: N/A;  . COLONOSCOPY  07/26/11   Crohn's colitis, ileitis  . ESOPHAGOGASTRODUODENOSCOPY  05/04/11, 07/26/11   granulomatous gastritis - Crohn's  . ESOPHAGOGASTRODUODENOSCOPY (EGD) WITH PROPOFOL N/A 03/28/2017   Procedure: ESOPHAGOGASTRODUODENOSCOPY (EGD) WITH PROPOFOL  with balloon dilation of duodenum;  Surgeon: [name], MD;  [facility];  Service: Gastroenterology;  Laterality: N/A;  . ESOPHAGOGASTRODUODENOSCOPY (EGD) WITH PROPOFOL N/A 12/06/2017   Procedure: ESOPHAGOGASTRODUODENOSCOPY (EGD) WITH PROPOFOL;  Surgeon: Gatha Mayer, MD;  [facility];  Service: Endoscopy;  Laterality: N/A;  . ESOPHAGOGASTRODUODENOSCOPY (EGD) WITH PROPOFOL N/A 06/21/2018   Procedure: ESOPHAGOGASTRODUODENOSCOPY (EGD) WITH PROPOFOL;  Surgeon: Gatha Mayer, MD;  [facility];  Service: Endoscopy;  Laterality: N/A;  . ETHMOIDECTOMY N/A 11/27/2018   Procedure: ENDOSCOPIC TOTAL ETHMOIDECTOMY;  Surgeon: Leta Baptist, MD;  Location: [place];  Service: ENT;  Laterality: N/A;  . FLEXIBLE SIGMOIDOSCOPY    . GASTROJEJUNOSTOMY  01/2018  . MULTIPLE TOOTH EXTRACTIONS  2005  . NASAL SEPTOPLASTY W/ TURBINOPLASTY Bilateral 11/27/2018   Procedure: NASAL SEPTOPLASTY WITH BILATERAL TURBINATE REDUCTION;  Surgeon: Leta Baptist, MD;  Location: [place];  Service: ENT;  Laterality: Bilateral;  . SHOULDER SURGERY     right  . SINUS ENDO WITH FUSION N/A 11/27/2018   Procedure: SINUS ENDO WITH FUSION;  Surgeon: Leta Baptist, MD;  Location: [place];  Service: ENT;  Laterality: N/A;  . UPPER GASTROINTESTINAL ENDOSCOPY    . VASECTOMY     bilateral w/lysis of penile adhesions   Allergies  Allergen Reactions  . Zithromax [Azithromycin] Hypertension  . Lisinopril Swelling    SWELLING REACTION UNSPECIFIED    Prior to Admission medications   Medication  Sig Start Date End Date Taking? Authorizing Provider  colestipol (COLESTID) 1 g tablet Take 1 tablet (1 g total) by mouth 2 (two) times daily. 04/18/19  Yes Gatha Mayer, MD  cyclobenzaprine (FLEXERIL) 10 MG tablet Take 10 mg by mouth 3 (three) times daily as needed (migraines).    Yes [provider]  doxazosin (CARDURA) 8 MG tablet Take 1 tablet (8 mg total) by mouth daily. Patient taking differently: Take 8 mg by mouth at bedtime.  11/13/18  Yes [name], MD  olmesartan (BENICAR) 20 MG tablet TAKE 1 TABLET BY MOUTH EVERY DAY 05/03/19  Yes [name], MD  prochlorperazine (COMPAZINE) 25 MG suppository Place 1 suppository (25 mg total) rectally every 12 (twelve) hours as needed for nausea or vomiting. 08/07/18  Yes Gatha Mayer, MD  rizatriptan (MAXALT-MLT) 10 MG disintegrating tablet Take 1 tablet (10 mg total) by mouth 3 (three) times daily as needed for migraine. 03/21/19  Yes Gatha Mayer, MD  verapamil (CALAN-SR) 180 MG CR tablet Take 2 tablets (360  mg total) by mouth daily. 11/13/18  Yes [name], MD   Social History   Socioeconomic History  . Marital status: Married    Spouse name: Not on file  . Number of children: 3  . Years of education: Some college  . Highest education level: Not on file  Occupational History  . Occupation: Environmental health practitioner: [employer]  Social Needs  . Financial resource strain: Not on file  . Food insecurity    Worry: Not on file    Inability: Not on file  . Transportation needs    Medical: Not on file    Non-medical: Not on file  Tobacco Use  . Smoking status: Former Smoker    Packs/day: 0.75    Years: 21.00    Pack years: 15.75    Types: Cigarettes    Quit date: 12/01/2017    Years since quitting: 1.4  . Smokeless tobacco: Former Systems developer    Types: Snuff  . Tobacco comment: Counseling sheet given in exam room   Substance and Sexual Activity  . Alcohol use: Yes    Alcohol/week: 0.0 standard drinks    Comment:  occ  . Drug use: No  . Sexual activity: Yes    Partners: Female  Lifestyle  . Physical activity    Days per week: Not on file    Minutes per session: Not on file  . Stress: Not on file  Relationships  . Social Herbalist on phone: Not on file    Gets together: Not on file    Attends religious service: Not on file    Active member of club or organization: Not on file    Attends meetings of clubs or organizations: Not on file    Relationship status: Not on file  . Intimate partner violence    Fear of current or ex partner: Not on file    Emotionally abused: Not on file    Physically abused: Not on file    Forced sexual activity: Not on file  Other Topics Concern  . Not on file  Social History Narrative   Married. Education: [employer].    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  Constitutional:      General: He is not in acute distress.    Appearance: He is well-developed. He is obese. He is not ill-appearing, toxic-appearing or diaphoretic.  HENT:     Head: Normocephalic and atraumatic.  Eyes:     Pupils: Pupils are equal, round, and reactive to light.  Neck:     Vascular: No carotid bruit or JVD.  Cardiovascular:     Rate and Rhythm: Normal rate and regular rhythm.     Heart sounds: Normal heart sounds.  No murmur.  Pulmonary:     Effort: Pulmonary effort is normal.      Breath sounds: Normal breath sounds. No rales.  Skin:    General: Skin is warm and dry.  Neurological:     General: No focal deficit present.     Mental Status: He is alert and oriented to person, place, and time.     Cranial Nerves: No cranial nerve deficit, dysarthria or facial asymmetry.     Sensory: No sensory deficit.     Motor: No weakness or pronator drift.     Coordination: Coordination normal. Finger-Nose-Finger Test normal.     Gait: Gait is intact. Gait normal.  Psychiatric:        Mood and Affect: Mood normal.        Behavior: Behavior normal.     EKG: SR, rate 77, Q in III, small Q in AVF - not noted in 05/2018. Nonspecific ST depression V5, V6, similar to November 2019.  No apparent acute findings.  Results for orders placed or performed in visit on 05/12/19  POCT CBC  Result Value Ref Range   WBC 4.7 4.6 - 10.2 K/uL   Lymph, poc 1.5 0.6 - 3.4   POC LYMPH PERCENT 31.6 10 - 50 %L   MID (cbc) 0.4 0 - 0.9   POC MID % 9.5 0 - 12 %M   POC Granulocyte 2.8 2 - 6.9   Granulocyte percent 58.9 37 - 80 %G   RBC 5.29 4.69 - 6.13 M/uL   Hemoglobin 13.8 11 - 14.6 g/dL   HCT, POC 42.1 (A) 29 - 41 %   MCV 79.5 76 - 111 fL   MCH, POC 26.2 (A) 27 - 31.2 pg   MCHC 32.9 31.8 - 35.4 g/dL   RDW, POC 15.7 %   Platelet Count, POC 160 142 - 424 K/uL   MPV 7.8 0 - 99.8 fL  POCT glucose (manual entry)  Result Value Ref Range   POC Glucose 85 70 - 99 mg/dl     Assessment & Plan:  Craig Guzman. is a 48 y.o. male . Heart palpitations - Plan: EKG 12-Lead, TSH, Ambulatory referral to Cardiology Dizziness - Plan: EKG 12-Lead, POCT CBC, POCT glucose (manual entry), Basic metabolic panel, Ambulatory referral to Cardiology Night sweats Volume depletion Nonspecific abnormal electrocardiogram (ECG) (EKG) - Plan: Ambulatory referral to Cardiology  -Suspect component of volume depletion with recent dizziness/orthostatic symptoms.  Complicated by his history of IBD, dumping syndrome.  May  have component of vertigo with symptoms with driving.  Episodic palpitations, some nonspecific EKG changes, but no acute chest symptoms.  -Initially try oral rehydration therapy at home with small sips of fluids frequently to minimize risk of vomiting.  -Refer to cardiology  -Labs as above  -Recheck 48 hours with ER precautions sooner if worse  No orders of the defined types were placed in this encounter.  Patient Instructions   Small sips of fluids frequently as we discussed.  No medication changes for now, but will check some lab work and discuss further in 2 days.  There were a couple of possible changes in the EKG from previous reading, so would like you to meet with cardiology.  If any chest pain, acute worsening of symptoms go to the emergency room.  Return to the clinic or go to the nearest emergency room if any of your symptoms worsen or new symptoms occur.    If you have lab work done today you  will be contacted with your lab results within the next 2 weeks.  If you have not heard from [place] then please contact us. The fastest way to get your results is to register for My Chart.   IF you received an x-ray today, you will receive an invoice from [facility]. Please contact [facility] at [phone] with questions or concerns regarding your invoice.   IF you received labwork today, you will receive an invoice from [facility]. Please contact [facility] at [phone] with questions or concerns regarding your invoice.   Our billing staff will not be able to assist you with questions regarding bills from these companies.  You will be contacted with the lab results as soon as they are available. The fastest way to get your results is to activate your My Chart account. Instructions are located on the last page of this paperwork. If you have not heard from [place] regarding the results in 2 weeks, please contact this office.           Signed, Merri Ray, MD [facility]

## 2019-05-13 ENCOUNTER — Encounter: Payer: Self-pay | Admitting: Family Medicine

## 2019-05-13 LAB — BASIC METABOLIC PANEL
BUN/Creatinine Ratio: 8 — ABNORMAL LOW (ref 9–20)
BUN: 8 mg/dL (ref 6–24)
CO2: 18 mmol/L — ABNORMAL LOW (ref 20–29)
Calcium: 8.6 mg/dL — ABNORMAL LOW (ref 8.7–10.2)
Chloride: 103 mmol/L (ref 96–106)
Creatinine, Ser: 1.01 mg/dL (ref 0.76–1.27)
GFR calc Af Amer: 102 mL/min/{1.73_m2} (ref >59)
GFR calc non Af Amer: 88 mL/min/{1.73_m2} (ref >59)
Glucose: 94 mg/dL (ref 65–99)
Potassium: 4 mmol/L (ref 3.5–5.2)
Sodium: 141 mmol/L (ref 134–144)

## 2019-05-13 LAB — TSH: TSH: 1.45 u[IU]/mL (ref 0.450–4.500)

## 2019-05-14 ENCOUNTER — Other Ambulatory Visit: Payer: Self-pay

## 2019-05-14 ENCOUNTER — Telehealth (INDEPENDENT_AMBULATORY_CARE_PROVIDER_SITE_OTHER): Payer: Self-pay | Admitting: Family Medicine

## 2019-05-14 DIAGNOSIS — R509 Fever, unspecified: Secondary | ICD-10-CM

## 2019-05-14 DIAGNOSIS — R42 Dizziness and giddiness: Secondary | ICD-10-CM

## 2019-05-14 DIAGNOSIS — R002 Palpitations: Secondary | ICD-10-CM

## 2019-05-14 NOTE — Progress Notes (Signed)
Virtual Visit via Telephone Note  I connected with Craig Guzman. on 05/14/19 at 4:01 PM by telephone and verified that I am speaking with the correct person using two identifiers.   I discussed the limitations, risks, security and privacy concerns of performing an evaluation and management service by telephone and the availability of in person appointments. I also discussed with the patient that there may be a patient responsible charge related to this service. The patient expressed understanding and agreed to proceed, consent obtained  Chief complaint: Follow up dizziness  History of Present Illness: Craig Guzman. is a 48 y.o. male  Follow-up from visit 2 days ago, with dizziness, heart palpitations, fatigue, vomiting.  Plan for initial trial at oral rehydration with small bits of fluids frequently.  He was orthostatic in the office with heart rate increasing from 78 lying to 116 standing at 0 minutes 122 at 3 minutes.  Blood pressure decreased 137 over 74-105/73 from lying to standing.  Previously reported palpitations, nonspecific ST depression V5, V6, small Q in aVF, Q in III. planned outpatient follow-up with cardiology with ER precautions.  WBC 4.7, hemoglobin 13.8 at office visit 2 days ago and afebrile at that time at 99, 98% O2 sat.  Since last visit: Drinking some increased fluids. UOP x 8 today - lighter urine. No recurrence of emesis. Min nausea. Large amount of formed stool this am.  Fever last night 100 - max 100.8 at 11pm.  Today - temp 98.1.  No abd pain.  No cough, no new shortness of breath. No known sick contacts, no known exposure to Covid 19 No change in taste/smell.  bodyaches yesterday. Feels like fever broke overnight last night.  Still wavy feeling in head, still a little woozy. Not near syncopal/no syncope. No headache.  Felt heart beating hard/fast for about 20 seconds, then back to normal. 8-9 episodes.no chest pain, slight short of breath when that  occurred.  On way to get Covid test, but testing    Patient Active Problem List   Diagnosis Date Noted  . Abdominal migraine, not intractable ? 03/21/2019  . Uninsured 03/21/2019  . Postsurgical dumping syndrome 02/14/2019  . S/P functional endoscopic sinus surgery 11/27/2018  . Abdominal pain, epigastric   . Angioedema 04/19/2018  . Intractable vomiting with nausea   . Cigarette nicotine dependence without complication 01/75/1025  . Shifting sleep-work schedule, affecting sleep 05/15/2016  . Sleep deprivation 05/15/2016  . Sleep related hypoventilation in conditions classified elsewhere 05/15/2016  . Sleep related headaches 05/15/2016  . Morbid obesity ([place]) 09/19/2013  . Diarrhea 07/10/2013  . 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 Hypertension 11/26/2012  . Gastroparesis??? 12/08/2011  . Common migraine without aura 12/08/2011  . Long-term use of immunosuppressant medication 08/08/2011  . Gastroduodenal Crohn's disease ([place]) 05/17/2011  . GERD (gastroesophageal reflux disease) 04/21/2011   Past Medical History:  Diagnosis Date  . Allergy    trees/ grasses/ animals/dust/mold  . Biliary dyskinesia   . Colloid thyroid nodule   . Crohn's disease ([place])    Stomach, terminal ileum, cecum  . Crohn's disease ([place])   . Dyspnea    due to nasal /sinus congestion  . Gastric ulcer    antral  . GERD (gastroesophageal reflux disease)   . 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 History of migraine headaches   . HTN (hypertension)   . Kidney stone 09/20/2012  . Migraine   .  Obesity   . Pneumonia 02/27/2014   ED  . Postsurgical dumping syndrome 02/14/2019  . Sleep apnea    has cpap- does not wear    Past Surgical History:  Procedure Laterality Date  . BALLOON DILATION N/A 12/06/2017   Procedure: BALLOON DILATION;  Surgeon: Gatha Mayer, MD;  Location: [facility];  Service: Endoscopy;  Laterality: N/A;  . BIOPSY  06/21/2018    Procedure: BIOPSY;  Surgeon: Gatha Mayer, MD;  [facility];  Service: Endoscopy;;  . CHOLECYSTECTOMY  2011   [facility]  . CIRCUMCISION N/A 12/09/2018   Procedure: CIRCUMCISION ADULT;  Surgeon: Lucas Mallow, MD;  [facility];  Service: Urology;  Laterality: N/A;  . COLONOSCOPY  07/26/11   Crohn's colitis, ileitis  . ESOPHAGOGASTRODUODENOSCOPY  05/04/11, 07/26/11   granulomatous gastritis - Crohn's  . ESOPHAGOGASTRODUODENOSCOPY (EGD) WITH PROPOFOL N/A 03/28/2017   Procedure: ESOPHAGOGASTRODUODENOSCOPY (EGD) WITH PROPOFOL  with balloon dilation of duodenum;  Surgeon: [name], MD;  [facility];  Service: Gastroenterology;  Laterality: N/A;  . ESOPHAGOGASTRODUODENOSCOPY (EGD) WITH PROPOFOL N/A 12/06/2017   Procedure: ESOPHAGOGASTRODUODENOSCOPY (EGD) WITH PROPOFOL;  Surgeon: Gatha Mayer, MD;  [facility];  Service: Endoscopy;  Laterality: N/A;  . ESOPHAGOGASTRODUODENOSCOPY (EGD) WITH PROPOFOL N/A 06/21/2018   Procedure: ESOPHAGOGASTRODUODENOSCOPY (EGD) WITH PROPOFOL;  Surgeon: Gatha Mayer, MD;  [facility];  Service: Endoscopy;  Laterality: N/A;  . ETHMOIDECTOMY N/A 11/27/2018   Procedure: ENDOSCOPIC TOTAL ETHMOIDECTOMY;  Surgeon: Leta Baptist, MD;  Location: [place];  Service: ENT;  Laterality: N/A;  . FLEXIBLE SIGMOIDOSCOPY    . GASTROJEJUNOSTOMY  01/2018  . MULTIPLE TOOTH EXTRACTIONS  2005  . NASAL SEPTOPLASTY W/ TURBINOPLASTY Bilateral 11/27/2018   Procedure: NASAL SEPTOPLASTY WITH BILATERAL TURBINATE REDUCTION;  Surgeon: Leta Baptist, MD;  Location: [place];  Service: ENT;  Laterality: Bilateral;  . SHOULDER SURGERY     right  . SINUS ENDO WITH FUSION N/A 11/27/2018   Procedure: SINUS ENDO WITH FUSION;  Surgeon: Leta Baptist, MD;  Location: [place];  Service: ENT;  Laterality: N/A;  . UPPER GASTROINTESTINAL ENDOSCOPY    . VASECTOMY     bilateral w/lysis of penile adhesions   Allergies  Allergen Reactions  . Zithromax [Azithromycin]  Hypertension  . Lisinopril Swelling    SWELLING REACTION UNSPECIFIED    Prior to Admission medications   Medication Sig Start Date End Date Taking? Authorizing Provider  colestipol (COLESTID) 1 g tablet Take 1 tablet (1 g total) by mouth 2 (two) times daily. 04/18/19   Gatha Mayer, MD  cyclobenzaprine (FLEXERIL) 10 MG tablet Take 10 mg by mouth 3 (three) times daily as needed (migraines).     [provider]  doxazosin (CARDURA) 8 MG tablet Take 1 tablet (8 mg total) by mouth daily. Patient taking differently: Take 8 mg by mouth at bedtime.  11/13/18   [name], MD  olmesartan (BENICAR) 20 MG tablet TAKE 1 TABLET BY MOUTH EVERY DAY 05/03/19   [name], MD  prochlorperazine (COMPAZINE) 25 MG suppository Place 1 suppository (25 mg total) rectally every 12 (twelve) hours as needed for nausea or vomiting. 08/07/18   Gatha Mayer, MD  rizatriptan (MAXALT-MLT) 10 MG disintegrating tablet Take 1 tablet (10 mg total) by mouth 3 (three) times daily as needed for migraine. 03/21/19   Gatha Mayer, MD  verapamil (CALAN-SR) 180 MG CR tablet Take 2 tablets (360 mg total) by mouth daily. 11/13/18   Carlota Raspberry,  Ranell Patrick, MD   Social History   Socioeconomic History  . Marital status: Married    Spouse name: Not on file  . Number of children: 3  . Years of education: Some college  . Highest education level: Not on file  Occupational History  . Occupation: Environmental health practitioner: [employer]  Social Needs  . Financial resource strain: Not on file  . Food insecurity    Worry: Not on file    Inability: Not on file  . Transportation needs    Medical: Not on file    Non-medical: Not on file  Tobacco Use  . Smoking status: Former Smoker    Packs/day: 0.75    Years: 21.00    Pack years: 15.75    Types: Cigarettes    Quit date: 12/01/2017    Years since quitting: 1.4  . Smokeless tobacco: Former Systems developer    Types: Snuff  . Tobacco comment: Counseling sheet given in exam room    Substance and Sexual Activity  . Alcohol use: Yes    Alcohol/week: 0.0 standard drinks    Comment: occ  . Drug use: No  . Sexual activity: Yes    Partners: Female  Lifestyle  . Physical activity    Days per week: Not on file    Minutes per session: Not on file  . Stress: Not on file  Relationships  . Social Herbalist on phone: Not on file    Gets together: Not on file    Attends religious service: Not on file    Active member of club or organization: Not on file    Attends meetings of clubs or organizations: Not on file    Relationship status: Not on file  . Intimate partner violence    Fear of current or ex partner: Not on file    Emotionally abused: Not on file    Physically abused: Not on file    Forced sexual activity: Not on file  Other Topics Concern  . Not on file  Social History Narrative   Married. Education: [employer].    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 Speaking in full sentences.  No distress over phone.  Appropriate responses  Assessment and Plan: Fever, unspecified  Dizziness  Heart palpitations Tolerating p.o. fluids.  Stable with decreased dizziness, still slight "wavy"  feeling but no new focal neurologic symptoms.  Now with onset of fever yesterday, episodes of palpitations overnight.  Plan on outpatient Covid testing, symptomatic care, quarantine/self isolation with masking of others at home.  Recommended he go to the emergency room if palpitations recur as he had last night, increasing shortness of breath, dizziness, or other new/worsening symptoms.  Understand expressed.  Recheck in 2 days with virtual visit.   Follow Up Instructions:    I discussed the assessment and treatment plan with the patient. The patient was provided an  opportunity to ask questions and all were answered. The patient agreed with the plan and demonstrated an understanding of the instructions.   The patient was advised to call back or seek an in-person evaluation if the symptoms worsen or if the condition fails to improve as anticipated.  I provided 11 minutes of non-face-to-face time during this  encounter.  Signed,   Merri Ray, MD [facility] [place].  05/14/19

## 2019-05-14 NOTE — Progress Notes (Signed)
Pt says he has had a fever since last night, he came in today for 6 month follow up and a refill of medications. Pt understands that his pcp will be giving him a call this eve. Medication and pharmacy has been verified

## 2019-05-16 ENCOUNTER — Ambulatory Visit: Payer: Self-pay | Admitting: Cardiovascular Disease

## 2019-05-16 NOTE — Progress Notes (Deleted)
No chief complaint on file.     History of Present Illness: 48 yo male with history of obesity, Crohn's disease, GERD, HTN and sleep apnea here today as a new consult for evaluation of palpitations. He has had recent dizziness in the setting of palpitations. He was orthostatic when seen in primary care 05/12/19 by Dr. Carlota Raspberry. He was encouraged to push his po hydration and was feeling somewhat better during virtual visit 05/14/19 with Dr. Carlota Raspberry. He was seen in 2016 by Dr. Wynonia Lawman for palpitations and wore a heart monitor at that time. I do not have the results of that monitor.   He tells me today that he ***  Primary Care Physician: [name], MD   Past Medical History:  Diagnosis Date  . Allergy    trees/ grasses/ animals/dust/mold  . Biliary dyskinesia   . Colloid thyroid nodule   . Crohn's disease ([place])    Stomach, terminal ileum, cecum  . Crohn's disease ([place])   . Dyspnea    due to nasal /sinus congestion  . Gastric ulcer    antral  . GERD (gastroesophageal reflux disease)   . 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 History of migraine headaches   . HTN (hypertension)   . Kidney stone 09/20/2012  . Migraine   . Obesity   . Pneumonia 02/27/2014   ED  . Postsurgical dumping syndrome 02/14/2019  . Sleep apnea    has cpap- does not wear     Past Surgical History:  Procedure Laterality Date  . BALLOON DILATION N/A 12/06/2017   Procedure: BALLOON DILATION;  Surgeon: Gatha Mayer, MD;  Location: [facility];  Service: Endoscopy;  Laterality: N/A;  . BIOPSY  06/21/2018   Procedure: BIOPSY;  Surgeon: Gatha Mayer, MD;  Location: [facility];  Service: Endoscopy;;  . CHOLECYSTECTOMY  2011   [facility]  . CIRCUMCISION N/A 12/09/2018   Procedure: CIRCUMCISION ADULT;  Surgeon: Lucas Mallow, MD;  Location: [facility];  Service: Urology;  Laterality: N/A;  . COLONOSCOPY  07/26/11   Crohn's colitis, ileitis  .  ESOPHAGOGASTRODUODENOSCOPY  05/04/11, 07/26/11   granulomatous gastritis - Crohn's  . ESOPHAGOGASTRODUODENOSCOPY (EGD) WITH PROPOFOL N/A 03/28/2017   Procedure: ESOPHAGOGASTRODUODENOSCOPY (EGD) WITH PROPOFOL  with balloon dilation of duodenum;  Surgeon: [name], MD;  [facility];  Service: Gastroenterology;  Laterality: N/A;  . ESOPHAGOGASTRODUODENOSCOPY (EGD) WITH PROPOFOL N/A 12/06/2017   Procedure: ESOPHAGOGASTRODUODENOSCOPY (EGD) WITH PROPOFOL;  Surgeon: Gatha Mayer, MD;  [facility];  Service: Endoscopy;  Laterality: N/A;  . ESOPHAGOGASTRODUODENOSCOPY (EGD) WITH PROPOFOL N/A 06/21/2018   Procedure: ESOPHAGOGASTRODUODENOSCOPY (EGD) WITH PROPOFOL;  Surgeon: Gatha Mayer, MD;  [facility];  Service: Endoscopy;  Laterality: N/A;  . ETHMOIDECTOMY N/A 11/27/2018   Procedure: ENDOSCOPIC TOTAL ETHMOIDECTOMY;  Surgeon: Leta Baptist, MD;  Location: [facility];  Service: ENT;  Laterality: N/A;  . FLEXIBLE SIGMOIDOSCOPY    . GASTROJEJUNOSTOMY  01/2018  . MULTIPLE TOOTH EXTRACTIONS  2005  . NASAL SEPTOPLASTY W/ TURBINOPLASTY Bilateral 11/27/2018   Procedure: NASAL SEPTOPLASTY WITH BILATERAL TURBINATE REDUCTION;  Surgeon: Leta Baptist, MD;  Location: [facility];  Service: ENT;  Laterality: Bilateral;  . SHOULDER SURGERY     right  . SINUS ENDO WITH FUSION N/A 11/27/2018   Procedure: SINUS ENDO WITH FUSION;  Surgeon: Leta Baptist, MD;  Location: [facility];  Service: ENT;  Laterality: N/A;  . UPPER  GASTROINTESTINAL ENDOSCOPY    . VASECTOMY     bilateral w/lysis of penile adhesions    Current Outpatient Medications  Medication Sig Dispense Refill  . colestipol (COLESTID) 1 g tablet Take 1 tablet (1 g total) by mouth 2 (two) times daily. 60 tablet 0  . cyclobenzaprine (FLEXERIL) 10 MG tablet Take 10 mg by mouth 3 (three) times daily as needed (migraines).     Marland Kitchen doxazosin (CARDURA) 8 MG tablet Take 1 tablet (8 mg total) by mouth daily. (Patient taking differently: Take 8 mg by mouth  at bedtime. ) 90 tablet 1  . olmesartan (BENICAR) 20 MG tablet TAKE 1 TABLET BY MOUTH EVERY DAY 90 tablet 1  . prochlorperazine (COMPAZINE) 25 MG suppository Place 1 suppository (25 mg total) rectally every 12 (twelve) hours as needed for nausea or vomiting. 12 suppository 1  . rizatriptan (MAXALT-MLT) 10 MG disintegrating tablet Take 1 tablet (10 mg total) by mouth 3 (three) times daily as needed for migraine. 9 tablet 3  . verapamil (CALAN-SR) 180 MG CR tablet Take 2 tablets (360 mg total) by mouth daily. 180 tablet 1   No current facility-administered medications for this visit.     Allergies  Allergen Reactions  . Zithromax [Azithromycin] Hypertension  . Lisinopril Swelling    SWELLING REACTION UNSPECIFIED     Social History   Socioeconomic History  . Marital status: Married    Spouse name: Not on file  . Number of children: 3  . Years of education: Some college  . Highest education level: Not on file  Occupational History  . Occupation: Environmental health practitioner: [employer]  Social Needs  . Financial resource strain: Not on file  . Food insecurity    Worry: Not on file    Inability: Not on file  . Transportation needs    Medical: Not on file    Non-medical: Not on file  Tobacco Use  . Smoking status: Former Smoker    Packs/day: 0.75    Years: 21.00    Pack years: 15.75    Types: Cigarettes    Quit date: 12/01/2017    Years since quitting: 1.4  . Smokeless tobacco: Former Systems developer    Types: Snuff  . Tobacco comment: Counseling sheet given in exam room   Substance and Sexual Activity  . Alcohol use: Yes    Alcohol/week: 0.0 standard drinks    Comment: occ  . Drug use: No  . Sexual activity: Yes    Partners: Female  Lifestyle  . Physical activity    Days per week: Not on file    Minutes per session: Not on file  . Stress: Not on file  Relationships  . Social Herbalist on phone: Not on file    Gets together: Not on file    Attends religious service: Not on  file    Active member of club or organization: Not on file    Attends meetings of clubs or organizations: Not on file    Relationship status: Not on file  . Intimate partner violence    Fear of current or ex partner: Not on file    Emotionally abused: Not on file    Physically abused: Not on file    Forced sexual activity: Not on file  Other Topics Concern  . Not on file  Social History Narrative   Married. Education: [employer].    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 Colon polyps Father   . Diabetes Father   . Hypertension Father   . Hypertension Mother   . Asthma Mother   . Heart disease Maternal Grandmother   . Colon cancer Neg Hx   . Rectal cancer Neg Hx   . Stomach cancer Neg Hx   . Esophageal cancer Neg Hx     Review of Systems:  As stated in the HPI and otherwise negative.   There were no vitals taken for this visit.  Physical Examination: General: Well developed, well nourished, NAD  HEENT: OP clear, mucus membranes moist  SKIN: warm, dry. No rashes. Neuro: No focal deficits  Musculoskeletal: Muscle strength 5/5 all ext  Psychiatric: Mood and affect normal  Neck: No JVD, no carotid bruits, no thyromegaly, no lymphadenopathy.  Lungs:Clear bilaterally, no wheezes, rhonci, crackles Cardiovascular: Regular rate and rhythm. No murmurs, gallops or rubs. Abdomen:Soft. Bowel sounds present. Non-tender.  Extremities: No lower extremity edema. Pulses are 2 + in the bilateral DP/PT.  EKG:  EKG {ACTION; IS/IS UVO:53664403} ordered today. The ekg ordered today demonstrates ***  Recent Labs: 12/04/2018: ALT 32; Platelets 217 05/12/2019: BUN 8; Creatinine, Ser 1.01; Hemoglobin 13.8; Potassium 4.0; Sodium 141; TSH 1.450   Lipid Panel    Component Value Date/Time   CHOL 160 01/18/2017 0942    TRIG 133 01/18/2017 0942   HDL 34 (L) 01/18/2017 0942   CHOLHDL 4.7 01/18/2017 0942   CHOLHDL 4.9 06/24/2016 0540   VLDL 15 06/24/2016 0540   LDLCALC 99 01/18/2017 0942     Wt Readings from Last 3 Encounters:  05/12/19 (!) 365 lb 9.6 oz (165.8 kg)  03/21/19 (!) 372 lb 2 oz (168.8 kg)  03/06/19 (!) 371 lb 3.2 oz (168.4 kg)     Other studies Reviewed: Additional studies/ records that were reviewed today include: ***. Review of the above records demonstrates: ***   Assessment and Plan:   1.   Current medicines are reviewed at length with the patient today.  The patient {ACTIONS; HAS/DOES NOT HAVE:19233} concerns regarding medicines.  The following changes have been made:  {PLAN; NO CHANGE:13088:s}  Labs/ tests ordered today include: *** No orders of the defined types were placed in this encounter.    Disposition:   FU with *** in {gen number [id number]} {TIME; UNITS DAY/WEEK/MONTH:19136}   Signed, Lauree Chandler, MD 05/16/2019 8:31 AM    [facility] [place], [place], [place]  [address] Phone: [phone]; Fax: [phone]

## 2019-05-19 ENCOUNTER — Other Ambulatory Visit: Payer: Self-pay

## 2019-05-19 DIAGNOSIS — Z20822 Contact with and (suspected) exposure to covid-19: Secondary | ICD-10-CM

## 2019-05-21 LAB — NOVEL CORONAVIRUS, NAA: SARS-CoV-2, NAA: NOT DETECTED

## 2019-06-09 ENCOUNTER — Other Ambulatory Visit: Payer: Self-pay

## 2019-06-09 MED ORDER — ONDANSETRON 4 MG PO TBDP: 4.0000 mg | ORAL_TABLET | Freq: Three times a day (TID) | ORAL | 0 refills | Status: DC | PRN

## 2019-06-09 NOTE — Telephone Encounter (Signed)
I spoke with Patient's wife [name] and she said Craig Guzman just wants to have ondansetron on hand if needed. We received a refill request from [facility] for this. I will send it in as he is up to date on his appointments.

## 2019-06-17 ENCOUNTER — Other Ambulatory Visit: Payer: Self-pay

## 2019-06-17 ENCOUNTER — Encounter: Payer: Self-pay | Admitting: Interventional Cardiology

## 2019-06-17 ENCOUNTER — Ambulatory Visit (INDEPENDENT_AMBULATORY_CARE_PROVIDER_SITE_OTHER): Payer: Self-pay | Admitting: Interventional Cardiology

## 2019-06-17 VITALS — BP 126/84 | HR 97 | Ht 72.0 in | Wt 366.0 lb

## 2019-06-17 DIAGNOSIS — I10 Essential (primary) hypertension: Secondary | ICD-10-CM

## 2019-06-17 DIAGNOSIS — G4733 Obstructive sleep apnea (adult) (pediatric): Secondary | ICD-10-CM

## 2019-06-17 DIAGNOSIS — R002 Palpitations: Secondary | ICD-10-CM

## 2019-06-17 NOTE — Progress Notes (Signed)
Cardiology Office Note   Date:  06/17/2019   ID:  Craig Clark., DOB [birth year], MRN [id number]  PCP:  [name], MD    No chief complaint on file.  palpitations  Wt Readings from Last 3 Encounters:  06/17/19 (!) 366 lb (166 kg)  05/12/19 (!) 365 lb 9.6 oz (165.8 kg)  03/21/19 (!) 372 lb 2 oz (168.8 kg)       History of Present Illness: Craig Guzman. is a 48 y.o. male who is being seen today for the evaluation of palpitations at the request of [name], MD.  Records from 05/2019: "Heart palpitations - Plan: EKG 12-Lead, TSH, Ambulatory referral to Cardiology Dizziness - Plan: EKG 12-Lead, POCT CBC, POCT glucose (manual entry), Basic metabolic panel, Ambulatory referral to Cardiology Night sweats Volume depletion Nonspecific abnormal electrocardiogram (ECG) (EKG) - Plan: Ambulatory referral to Cardiology             -Suspect component of volume depletion with recent dizziness/orthostatic symptoms.  Complicated by his history of IBD, dumping syndrome.  May have component of vertigo with symptoms with driving.  Episodic palpitations, some nonspecific EKG changes, but no acute chest symptoms.             -Initially try oral rehydration therapy at home with small sips of fluids frequently to minimize risk of vomiting."  Since the last visit, he has had episodes of rapid heart rate.  It has woken him form sleep.  It feels like an anxiety attack , which he has had in the past.  It is not a full blown anxiety attack.  It lasts 30-60 seconds and then resolves- usually after sitting up.  He went to the hospital on one occasion, but had a negative w/u.    Denies : exertional Chest pain. Dizziness. Leg edema. Nitroglycerin use. Orthopnea. Palpitations. Paroxysmal nocturnal dyspnea. Shortness of breath. Syncope.   Had intestinal surgery in 11/2018.    Walking is mst strenuous actvity.    No family h/o CAD.   GM with pacer.   Past Medical History:  Diagnosis  Date  . Allergy    trees/ grasses/ animals/dust/mold  . Biliary dyskinesia   . Colloid thyroid nodule   . Crohn's disease ([place])    Stomach, terminal ileum, cecum  . Crohn's disease ([place])   . Dyspnea    due to nasal /sinus congestion  . Gastric ulcer    antral  . GERD (gastroesophageal reflux disease)   . 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 History of migraine headaches   . HTN (hypertension)   . Kidney stone 09/20/2012  . Migraine   . Obesity   . Pneumonia 02/27/2014   ED  . Postsurgical dumping syndrome 02/14/2019  . Sleep apnea    has cpap- does not wear     Past Surgical History:  Procedure Laterality Date  . BALLOON DILATION N/A 12/06/2017   Procedure: BALLOON DILATION;  Surgeon: Gatha Mayer, MD;  Location: [facility];  Service: Endoscopy;  Laterality: N/A;  . BIOPSY  06/21/2018   Procedure: BIOPSY;  Surgeon: Gatha Mayer, MD;  [facility];  Service: Endoscopy;;  . CHOLECYSTECTOMY  2011   [facility]  . CIRCUMCISION N/A 12/09/2018   Procedure: CIRCUMCISION ADULT;  Surgeon: Lucas Mallow, MD;  [facility];  Service: Urology;  Laterality: N/A;  . COLONOSCOPY  07/26/11   Crohn's  colitis, ileitis  . ESOPHAGOGASTRODUODENOSCOPY  05/04/11, 07/26/11   granulomatous gastritis - Crohn's  . ESOPHAGOGASTRODUODENOSCOPY (EGD) WITH PROPOFOL N/A 03/28/2017   Procedure: ESOPHAGOGASTRODUODENOSCOPY (EGD) WITH PROPOFOL  with balloon dilation of duodenum;  Surgeon: [name], MD;  [facility];  Service: Gastroenterology;  Laterality: N/A;  . ESOPHAGOGASTRODUODENOSCOPY (EGD) WITH PROPOFOL N/A 12/06/2017   Procedure: ESOPHAGOGASTRODUODENOSCOPY (EGD) WITH PROPOFOL;  Surgeon: Gatha Mayer, MD;  [facility];  Service: Endoscopy;  Laterality: N/A;  . ESOPHAGOGASTRODUODENOSCOPY (EGD) WITH PROPOFOL N/A 06/21/2018   Procedure: ESOPHAGOGASTRODUODENOSCOPY (EGD) WITH PROPOFOL;  Surgeon: Gatha Mayer, MD;  [facility];  Service: Endoscopy;  Laterality: N/A;  . ETHMOIDECTOMY N/A 11/27/2018   Procedure: ENDOSCOPIC TOTAL ETHMOIDECTOMY;  Surgeon: Leta Baptist, MD;  Location: [facility];  Service: ENT;  Laterality: N/A;  . FLEXIBLE SIGMOIDOSCOPY    . GASTROJEJUNOSTOMY  01/2018  . MULTIPLE TOOTH EXTRACTIONS  2005  . NASAL SEPTOPLASTY W/ TURBINOPLASTY Bilateral 11/27/2018   Procedure: NASAL SEPTOPLASTY WITH BILATERAL TURBINATE REDUCTION;  Surgeon: Leta Baptist, MD;  Location: [facility];  Service: ENT;  Laterality: Bilateral;  . SHOULDER SURGERY     right  . SINUS ENDO WITH FUSION N/A 11/27/2018   Procedure: SINUS ENDO WITH FUSION;  Surgeon: Leta Baptist, MD;  Location: [facility];  Service: ENT;  Laterality: N/A;  . UPPER GASTROINTESTINAL ENDOSCOPY    . VASECTOMY     bilateral w/lysis of penile adhesions     Current Outpatient Medications  Medication Sig Dispense Refill  . colestipol (COLESTID) 1 g tablet Take 1 tablet (1 g total) by mouth 2 (two) times daily. 60 tablet 0  . cyclobenzaprine (FLEXERIL) 10 MG tablet Take 10 mg by mouth 3 (three) times daily as needed (migraines).     Marland Kitchen doxazosin (CARDURA) 8 MG tablet Take 1 tablet (8 mg total) by mouth daily. (Patient taking differently: Take 8 mg by mouth at bedtime. ) 90 tablet 1  . olmesartan (BENICAR) 20 MG tablet TAKE 1 TABLET BY MOUTH EVERY DAY 90 tablet 1  . ondansetron (ZOFRAN-ODT) 4 MG disintegrating tablet Take 1 tablet (4 mg total) by mouth every 8 (eight) hours as needed for nausea or vomiting. 30 tablet 0  . prochlorperazine (COMPAZINE) 25 MG suppository Place 1 suppository (25 mg total) rectally every 12 (twelve) hours as needed for nausea or vomiting. 12 suppository 1  . rizatriptan (MAXALT-MLT) 10 MG disintegrating tablet Take 1 tablet (10 mg total) by mouth 3 (three) times daily as needed for migraine. 9 tablet 3  . verapamil (CALAN-SR) 180 MG CR tablet Take 2 tablets (360 mg total) by mouth daily. 180 tablet 1   No current  facility-administered medications for this visit.    Allergies:   Zithromax [azithromycin] and Lisinopril    Social History:  The patient  reports that he quit smoking about 18 months ago. His smoking use included cigarettes. He has a 15.75 pack-year smoking history. He has quit using smokeless tobacco.  His smokeless tobacco use included snuff. He reports current alcohol use. He reports that he does not use drugs.   Family History:  The patient's family history includes Asthma in his mother; Colon polyps in his father; Diabetes in his father; Heart disease in his maternal grandmother; Hypertension in his father and mother.    ROS:  Please see the history of present illness.   Otherwise, review of systems are positive for palpitations.  Quit smoking in 2019.   All other systems are reviewed and  negative.    PHYSICAL EXAM: VS:  BP 126/84   Pulse 97   Ht 6' (1.829 m)   Wt (!) 366 lb (166 kg)   SpO2 98%   BMI 49.64 kg/m  , BMI Body mass index is 49.64 kg/m. GEN: Well nourished, well developed, in no acute distress  HEENT: normal  Neck: no JVD, carotid bruits, or masses Cardiac: RRR; no murmurs, rubs, or gallops,no edema  Respiratory:  clear to auscultation bilaterally, normal work of breathing GI: soft, nontender, nondistended, + BS, obese MS: no deformity or atrophy  Skin: warm and dry, no rash Neuro:  Strength and sensation are intact Psych: euthymic mood, full affect   EKG:   The ekg ordered today demonstrates NSR, nonspecific ST changes   Recent Labs: 12/04/2018: ALT 32; Platelets 217 05/12/2019: BUN 8; Creatinine, Ser 1.01; Hemoglobin 13.8; Potassium 4.0; Sodium 141; TSH 1.450   Lipid Panel    Component Value Date/Time   CHOL 160 01/18/2017 0942   TRIG 133 01/18/2017 0942   HDL 34 (L) 01/18/2017 0942   CHOLHDL 4.7 01/18/2017 0942   CHOLHDL 4.9 06/24/2016 0540   VLDL 15 06/24/2016 0540   LDLCALC 99 01/18/2017 0942     Other studies Reviewed: Additional studies/  records that were reviewed today with results demonstrating: .   ASSESSMENT AND PLAN:  1. Palpitations: Sx sound benign.  Episodes 3-4x/week.  Willl plan 30 day event monitor.  Definitely need to r/o AFib.  2. Morbid obesity: Down from 380.  Better with dumping syndrome and walking.  Unable to use CPAP currently due to other issues.  3. HTN: The current medical regimen is effective;  continue present plan and medications. 4. Crohns/dumoing syndrome:  INtestinal surery was for this.  Current medicines are reviewed at length with the patient today.  The patient concerns regarding his medicines were addressed.  The following changes have been made:  No change  Labs/ tests ordered today include:  No orders of the defined types were placed in this encounter.   Recommend 150 minutes/week of aerobic exercise Low fat, low carb, high fiber diet recommended  Disposition:   FU in based on results   Signed, Larae Grooms, MD  06/17/2019 2:15 PM    [facility] [place], [place], [place]  [address] Phone: [phone]; Fax: [phone]

## 2019-06-17 NOTE — Patient Instructions (Signed)
Medication Instructions:  Your physician recommends that you continue on your current medications as directed. Please refer to the Current Medication list given to you today.  If you need a refill on your cardiac medications before your next appointment, please call your pharmacy.   Lab work: None Ordered  If you have labs (blood work) drawn today and your tests are completely normal, you will receive your results only by: Marland Kitchen MyChart Message (if you have MyChart) OR . A paper copy in the mail If you have any lab test that is abnormal or we need to change your treatment, we will call you to review the results.  Testing/Procedures: Your physician has recommended that you wear an event monitor. Event monitors are medical devices that record the heart's electrical activity. Doctors most often [place] these monitors to diagnose arrhythmias. Arrhythmias are problems with the speed or rhythm of the heartbeat. The monitor is a small, portable device. You can wear one while you do your normal daily activities. This is usually used to diagnose what is causing palpitations/syncope (passing out).  Follow-Up: . Based on test results  Any Other Special Instructions Will Be Listed Below (If Applicable).  [employer] Cardiac Event Monitor Instructions Your physician has requested you wear your cardiac event monitor for _____ days, (1-30). [employer] may call or text to confirm a shipping address. The monitor will be sent to a land address via UPS. [employer] will not ship a monitor to a PO BOX. It typically takes 3-5 days to receive your monitor after it has been enrolled. [employer] will assist with USPS tracking if your package is delayed. The telephone number for [employer] is [phone]. Once you have received your monitor, please review the enclosed instructions. Instruction tutorials can also be viewed under help and settings on the enclosed cell phone. Your monitor has already been registered  assigning a specific monitor serial # to you.  Applying the monitor Remove cell phone from case and turn it on. The cell phone works as Dealer and needs to be within [employer] of you at all times. The cell phone will need to be charged on a daily basis. We recommend you plug the cell phone into the enclosed charger at your bedside table every night.  Monitor batteries: You will receive two monitor batteries labelled #1 and #2. These are your recorders. Plug battery #2 onto the second connection on the enclosed charger. Keep one battery on the charger at all times. This will keep the monitor battery deactivated. It will also keep it fully charged for when you need to switch your monitor batteries. A small light will be blinking on the battery emblem when it is charging. The light on the battery emblem will remain on when the battery is fully charged.  Open package of a Monitor strip. Insert battery #1 into black hood on strip and gently squeeze monitor battery onto connection as indicated in instruction booklet. Set aside while preparing skin.  Choose location for your strip, vertical or horizontal, as indicated in the instruction booklet. Shave to remove all hair from location. There cannot be any lotions, oils, powders, or colognes on skin where monitor is to be applied. Wipe skin clean with enclosed Saline wipe. Dry skin completely.  Peel paper labeled #1 off the back of the Monitor strip exposing the adhesive. Place the monitor on the chest in the vertical or horizontal position shown in the instruction booklet. One arrow on the monitor strip must be pointing upward. Carefully  remove paper labeled #2, attaching remainder of strip to your skin. Try not to create any folds or wrinkles in the strip as you apply it.  Firmly press and release the circle in the center of the monitor battery. You will hear a small beep. This is turning the monitor battery on. The heart emblem on the  monitor battery will light up every 5 seconds if the monitor battery in turned on and connected to the patient securely. Do not push and hold the circle down as this turns the monitor battery off. The cell phone will locate the monitor battery. A screen will appear on the cell phone checking the connection of your monitor strip. This may read poor connection initially but change to good connection within the next minute. Once your monitor accepts the connection you will hear a series of 3 beeps followed by a climbing crescendo of beeps. A screen will appear on the cell phone showing the two monitor strip placement options. Touch the picture that demonstrates where you applied the monitor strip.  Your monitor strip and battery are waterproof. You are able to shower, bathe, or swim with the monitor on. They just ask you do not submerge deeper than 3 feet underwater. We recommend removing the monitor if you are swimming in a lake, river, or ocean.  Your monitor battery will need to be switched to a fully charged monitor battery approximately once a week. The cell phone will alert you of an action which needs to be made.  On the cell phone, tap for details to reveal connection status, monitor battery status, and cell phone battery status. The green dots indicates your monitor is in good status. A red dot indicates there is something that needs your attention.  To record a symptom, click the circle on the monitor battery. In 30-60 seconds a list of symptoms will appear on the cell phone. Select your symptom and tap save. Your monitor will record a sustained or significant arrhythmia regardless of you clicking the button. Some patients do not feel the heart rhythm irregularities. [employer] will notify us of any serious or critical events.  Refer to instruction booklet for instructions on switching batteries, changing strips, the Do not disturb or Pause features, or any additional  questions.  Call [employer] at [phone], to confirm your monitor is transmitting and record your baseline. They will answer any questions you may have regarding the monitor instructions at that time.  Returning the monitor to [place] all equipment back into blue box. Peel off strip of paper to expose adhesive and close box securely. There is a prepaid UPS shipping label on this box. Drop in a UPS drop box, or at a UPS facility like [employer]. You may also contact [employer] to arrange UPS to pick up monitor package at your home.

## 2019-07-07 ENCOUNTER — Other Ambulatory Visit: Payer: Self-pay

## 2019-07-07 MED ORDER — COLESTIPOL HCL 1 G PO TABS: 1.0000 g | ORAL_TABLET | Freq: Two times a day (BID) | ORAL | 5 refills | Status: DC

## 2019-07-07 NOTE — Telephone Encounter (Signed)
Colestipol refilled as pharmacy requested.

## 2019-09-01 ENCOUNTER — Telehealth: Payer: Self-pay | Admitting: *Deleted

## 2019-09-01 NOTE — Telephone Encounter (Signed)
Patient enrolled for [employer] to ship a  Day cardiac event monitor to his home. Patient address and insurance information has been updated.   Monitor instructions sent via My Chart message.

## 2019-09-05 ENCOUNTER — Ambulatory Visit (INDEPENDENT_AMBULATORY_CARE_PROVIDER_SITE_OTHER): Payer: 59

## 2019-09-05 DIAGNOSIS — R002 Palpitations: Secondary | ICD-10-CM | POA: Diagnosis not present

## 2019-09-10 ENCOUNTER — Ambulatory Visit: Payer: 59 | Admitting: Family Medicine

## 2019-09-10 ENCOUNTER — Encounter: Payer: Self-pay | Admitting: Family Medicine

## 2019-09-10 ENCOUNTER — Other Ambulatory Visit: Payer: Self-pay

## 2019-09-10 VITALS — BP 128/82 | HR 106 | Temp 97.8°F | Ht 72.0 in | Wt 358.0 lb

## 2019-09-10 DIAGNOSIS — R351 Nocturia: Secondary | ICD-10-CM

## 2019-09-10 DIAGNOSIS — Z13220 Encounter for screening for lipoid disorders: Secondary | ICD-10-CM

## 2019-09-10 DIAGNOSIS — R739 Hyperglycemia, unspecified: Secondary | ICD-10-CM | POA: Diagnosis not present

## 2019-09-10 DIAGNOSIS — I10 Essential (primary) hypertension: Secondary | ICD-10-CM

## 2019-09-10 DIAGNOSIS — R002 Palpitations: Secondary | ICD-10-CM

## 2019-09-10 DIAGNOSIS — K50919 Crohn's disease, unspecified, with unspecified complications: Secondary | ICD-10-CM

## 2019-09-10 DIAGNOSIS — Z7189 Other specified counseling: Secondary | ICD-10-CM

## 2019-09-10 MED ORDER — OLMESARTAN MEDOXOMIL 20 MG PO TABS: 20.0000 mg | ORAL_TABLET | Freq: Every day | ORAL | 1 refills | Status: DC

## 2019-09-10 MED ORDER — VERAPAMIL HCL ER 180 MG PO TBCR: 360.0000 mg | EXTENDED_RELEASE_TABLET | Freq: Every day | ORAL | 1 refills | Status: DC

## 2019-09-10 MED ORDER — DOXAZOSIN MESYLATE 8 MG PO TABS: 8.0000 mg | ORAL_TABLET | Freq: Every day | ORAL | 1 refills | Status: DC

## 2019-09-10 NOTE — Patient Instructions (Addendum)
No med changes today.  Follow up with gastroenterology as planned, but if change in symptoms, worsening or any fever be seen right away.   COVID-19 Vaccine Information can be found at: ShippingScam.co.uk For questions related to vaccine distribution or appointments, please email [email or web address] or call [phone].   Here is a link to information about the COVID-19 vaccine: RecruitSuit.[email or web address]   If you have lab work done today you will be contacted with your lab results within the next 2 weeks.  If you have not heard from [place] then please contact us. The fastest way to get your results is to register for My Chart.   IF you received an x-ray today, you will receive an invoice from [facility]. Please contact [facility] at [phone] with questions or concerns regarding your invoice.   IF you received labwork today, you will receive an invoice from [facility]. Please contact [facility] at [phone] with questions or concerns regarding your invoice.   Our billing staff will not be able to assist you with questions regarding bills from these companies.  You will be contacted with the lab results as soon as they are available. The fastest way to get your results is to activate your My Chart account. Instructions are located on the last page of this paperwork. If you have not heard from [place] regarding the results in 2 weeks, please contact this office.

## 2019-09-10 NOTE — Progress Notes (Signed)
   Subjective:    Patient ID: Craig Guzman., male    DOB: 08-24-1970, 49 y.o.   MRN: [id number]  HPI    Review of Systems     Objective:   Physical Exam        Assessment & Plan:

## 2019-09-10 NOTE — Progress Notes (Signed)
Subjective:  Patient ID: Craig Clark., male    DOB: 06/23/1971  Age: 49 y.o. MRN: [id number]  CC:  Chief Complaint  Patient presents with  . Follow-up    on heart palpitations and dizzieness. pt is no longer having dizzieness. pt got his heart monitor last week. pt states he is still having these palpitation, but not as frequent. pt states he feels some pressure when has one of these palps.     HPI Craig Guzman. presents for   Palpitations, dizziness Discussed at November 9 visit.  Associated with some palpitations at times.  Suspected  The patient continues on with dizziness and orthostatic symptoms.  Complicated by his history of IBD and dumping syndrome.  Thought to have a component of vertigo with his driving symptoms, and some nonspecific EKG changes with his palpitations.  Oral rehydration therapy was recommended with recheck in 48 hours.  He was referred to cardiology.  Evaluated June 17, 2019 by cardiology.  Symptoms sound benign at that time with episodes 3-4 times per week.  30-day event monitor was obtained. Just started event monitor. Rare heart palpitations when tired, but no recent significant symptoms. Every few weeks prior.   Hypertension: Currently on doxazosin 8 mg daily, Benicar 20 mg daily, verapamil 360 mg daily.  Home readings: 130/80.  Nocturia better on doxazosin - 2 times per night.  BP Readings from Last 3 Encounters:  09/10/19 128/82  06/17/19 126/84  05/12/19 132/71   Lab Results  Component Value Date   CREATININE 1.01 05/12/2019   Crohns disease, with dumping syndrome.  appt with GI next month. [name].  Still flairs of abdominal pain, vomiting at times same as in past - denies new symptoms. Certain foods worse.  Still some dumping syndrome. Has to rest about an hour after BM or weakness ensues.  Trying to drink fluids. Feels warm at night, flushing feeling at times. No fever.  Lab Results  Component Value Date   TSH 1.450  05/12/2019   Hyperglycemia: 101-114 last year. 94 in November.   Wt Readings from Last 3 Encounters:  09/10/19 (!) 358 lb (162.4 kg)  06/17/19 (!) 366 lb (166 kg)  05/12/19 (!) 365 lb 9.6 oz (165.8 kg)   Lipid screening.  Lab Results  Component Value Date   CHOL 160 01/18/2017   HDL 34 (L) 01/18/2017   LDLCALC 99 01/18/2017   TRIG 133 01/18/2017   CHOLHDL 4.7 01/18/2017    History Patient Active Problem List   Diagnosis Date Noted  . Abdominal migraine, not intractable ? 03/21/2019  . Uninsured 03/21/2019  . Postsurgical dumping syndrome 02/14/2019  . S/P functional endoscopic sinus surgery 11/27/2018  . Abdominal pain, epigastric   . Angioedema 04/19/2018  . Intractable vomiting with nausea   . Cigarette nicotine dependence without complication 74/02/1447  . Shifting sleep-work schedule, affecting sleep 05/15/2016  . Sleep deprivation 05/15/2016  . Sleep related hypoventilation in conditions classified elsewhere 05/15/2016  . Sleep related headaches 05/15/2016  . Morbid obesity ([place]) 09/19/2013  . Diarrhea 07/10/2013  . 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 Hypertension 11/26/2012  . Gastroparesis??? 12/08/2011  . Common migraine without aura 12/08/2011  . Long-term use of immunosuppressant medication 08/08/2011  . 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 GERD (gastroesophageal reflux disease) 04/21/2011   Past Medical History:  Diagnosis Date  . Allergy    trees/ grasses/ animals/dust/mold  . Biliary dyskinesia   . Colloid thyroid nodule   . Crohn's disease (  [place])    Stomach, terminal ileum, cecum  . Crohn's disease ([place])   . Dyspnea    due to nasal /sinus congestion  . Gastric ulcer    antral  . GERD (gastroesophageal reflux disease)   . 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 History of migraine headaches   . HTN (hypertension)   . Kidney stone 09/20/2012  . Migraine   . Obesity   . Pneumonia  02/27/2014   ED  . Postsurgical dumping syndrome 02/14/2019  . Sleep apnea    has cpap- does not wear    Past Surgical History:  Procedure Laterality Date  . BALLOON DILATION N/A 12/06/2017   Procedure: BALLOON DILATION;  Surgeon: Gatha Mayer, MD;  Location: [facility];  Service: Endoscopy;  Laterality: N/A;  . BIOPSY  06/21/2018   Procedure: BIOPSY;  Surgeon: Gatha Mayer, MD;  Location: [facility];  Service: Endoscopy;;  . CHOLECYSTECTOMY  2011   [place]  . CIRCUMCISION N/A 12/09/2018   Procedure: CIRCUMCISION ADULT;  Surgeon: Lucas Mallow, MD;  Location: [facility];  Service: Urology;  Laterality: N/A;  . COLONOSCOPY  07/26/11   Crohn's colitis, ileitis  . ESOPHAGOGASTRODUODENOSCOPY  05/04/11, 07/26/11   granulomatous gastritis - Crohn's  . ESOPHAGOGASTRODUODENOSCOPY (EGD) WITH PROPOFOL N/A 03/28/2017   Procedure: ESOPHAGOGASTRODUODENOSCOPY (EGD) WITH PROPOFOL  with balloon dilation of duodenum;  Surgeon: [name], MD;  Location: [facility];  Service: Gastroenterology;  Laterality: N/A;  . ESOPHAGOGASTRODUODENOSCOPY (EGD) WITH PROPOFOL N/A 12/06/2017   Procedure: ESOPHAGOGASTRODUODENOSCOPY (EGD) WITH PROPOFOL;  Surgeon: Gatha Mayer, MD;  Location: [facility];  Service: Endoscopy;  Laterality: N/A;  . ESOPHAGOGASTRODUODENOSCOPY (EGD) WITH PROPOFOL N/A 06/21/2018   Procedure: ESOPHAGOGASTRODUODENOSCOPY (EGD) WITH PROPOFOL;  Surgeon: Gatha Mayer, MD;  Location: [facility];  Service: Endoscopy;  Laterality: N/A;  . ETHMOIDECTOMY N/A 11/27/2018   Procedure: ENDOSCOPIC TOTAL ETHMOIDECTOMY;  Surgeon: Leta Baptist, MD;  Location: [facility];  Service: ENT;  Laterality: N/A;  . FLEXIBLE SIGMOIDOSCOPY    . GASTROJEJUNOSTOMY  01/2018  . MULTIPLE TOOTH EXTRACTIONS  2005  . NASAL SEPTOPLASTY W/ TURBINOPLASTY Bilateral 11/27/2018   Procedure: NASAL SEPTOPLASTY WITH BILATERAL TURBINATE REDUCTION;  Surgeon: Leta Baptist, MD;  Location: [facility];  Service: ENT;  Laterality: Bilateral;    . SHOULDER SURGERY     right  . SINUS ENDO WITH FUSION N/A 11/27/2018   Procedure: SINUS ENDO WITH FUSION;  Surgeon: Leta Baptist, MD;  Location: [facility];  Service: ENT;  Laterality: N/A;  . UPPER GASTROINTESTINAL ENDOSCOPY    . VASECTOMY     bilateral w/lysis of penile adhesions   Allergies  Allergen Reactions  . Zithromax [Azithromycin] Hypertension  . Lisinopril Swelling    SWELLING REACTION UNSPECIFIED    Prior to Admission medications   Medication Sig Start Date End Date Taking? Authorizing Provider  colestipol (COLESTID) 1 g tablet Take 1 tablet (1 g total) by mouth 2 (two) times daily. 07/07/19  Yes Gatha Mayer, MD  cyclobenzaprine (FLEXERIL) 10 MG tablet Take 10 mg by mouth 3 (three) times daily as needed (migraines).    Yes [provider]  doxazosin (CARDURA) 8 MG tablet Take 1 tablet (8 mg total) by mouth daily. Patient taking differently: Take 8 mg by mouth at bedtime.  11/13/18  Yes [name], MD  olmesartan (BENICAR) 20 MG tablet TAKE 1 TABLET BY MOUTH EVERY DAY 05/03/19  Yes Carlota Raspberry,  Ranell Patrick, MD  ondansetron (ZOFRAN-ODT) 4 MG disintegrating tablet Take 1 tablet (4 mg total) by mouth every 8 (eight) hours as needed for nausea or vomiting. 06/09/19  Yes Gatha Mayer, MD  prochlorperazine (COMPAZINE) 25 MG suppository Place 1 suppository (25 mg total) rectally every 12 (twelve) hours as needed for nausea or vomiting. 08/07/18  Yes Gatha Mayer, MD  rizatriptan (MAXALT-MLT) 10 MG disintegrating tablet Take 1 tablet (10 mg total) by mouth 3 (three) times daily as needed for migraine. 03/21/19  Yes Gatha Mayer, MD  verapamil (CALAN-SR) 180 MG CR tablet Take 2 tablets (360 mg total) by mouth daily. 11/13/18  Yes [name], MD   Social History   Socioeconomic History  . Marital status: Married    Spouse name: Not on file  . Number of children: 3  . Years of education: Some college  . Highest education level: Not on file  Occupational History  .  Occupation: Environmental health practitioner: [employer]  Tobacco Use  . Smoking status: Former Smoker    Packs/day: 0.75    Years: 21.00    Pack years: 15.75    Types: Cigarettes    Quit date: 12/01/2017    Years since quitting: 1.7  . Smokeless tobacco: Former Systems developer    Types: Snuff  . Tobacco comment: Counseling sheet given in exam room   Substance and Sexual Activity  . Alcohol use: Yes    Alcohol/week: 0.0 standard drinks    Comment: occ  . Drug use: No  . Sexual activity: Yes    Partners: Female  Other Topics Concern  . Not on file  Social History Narrative   Married. Education: [employer].    Lives at home w/ his wife and Engineer, site for [employer]   Right-handed   Caffeine: 3 sodas per day   Occasional alcohol no drugs   Former smoker former snuff user   Social Determinants of Radio broadcast assistant Strain:   . Difficulty of Paying Living Expenses: Not on file  Food Insecurity:   . Worried About Charity fundraiser in the Last Year: Not on file  . Ran Out of Food in the Last Year: Not on file  Transportation Needs:   . Lack of Transportation (Medical): Not on file  . Lack of Transportation (Non-Medical): Not on file  Physical Activity:   . Days of Exercise per Week: Not on file  . Minutes of Exercise per Session: Not on file  Stress:   . Feeling of Stress : Not on file  Social Connections:   . Frequency of Communication with Friends and Family: Not on file  . Frequency of Social Gatherings with Friends and Family: Not on file  . Attends Religious Services: Not on file  . Active Member of Clubs or Organizations: Not on file  . Attends Archivist Meetings: Not on file  . Marital Status: Not on file  Intimate Partner Violence:   . Fear of Current or Ex-Partner: Not on file  . Emotionally Abused: Not on file  . Physically Abused: Not on file  . Sexually Abused: Not on file    Review of Systems   Objective:   Vitals:   09/10/19 1603  BP: 128/82    Pulse: (!) 106  Temp: 97.8 F (36.6 C)  TempSrc: Temporal  SpO2: 98%  Weight: (!) 358 lb (162.4 kg)  Height: 6' (1.829 m)     Physical Exam  Vitals reviewed.  Constitutional:      Appearance: Normal appearance. He is well-developed. He is obese.  HENT:     Head: Normocephalic and atraumatic.  Eyes:     Pupils: Pupils are equal, round, and reactive to light.  Neck:     Vascular: No carotid bruit or JVD.  Cardiovascular:     Rate and Rhythm: Normal rate and regular rhythm.     Heart sounds: Normal heart sounds. No murmur.  Pulmonary:     Effort: Pulmonary effort is normal.     Breath sounds: Normal breath sounds. No rales.  Abdominal:     Tenderness: There is abdominal tenderness (left sided  similar area as prior. ).  Skin:    General: Skin is warm and dry.  Neurological:     Mental Status: He is alert and oriented to person, place, and time.       Assessment & Plan:  Craig Guzman. is a 49 y.o. male . Hyperglycemia - Plan: Hemoglobin A1c, CANCELED: Comprehensive metabolic panel  - prior mild elevation. Check A1c.   Nocturia - Plan: doxazosin (CARDURA) 8 MG tablet  - improved with cardura. Continue same.   Essential hypertension - Plan: Lipid panel, Comprehensive metabolic panel, TSH, verapamil (CALAN-SR) 180 MG CR tablet, doxazosin (CARDURA) 8 MG tablet, olmesartan (BENICAR) 20 MG tablet  - stable, continue same regimen.  Screening for hyperlipidemia  - labs pending.   Crohn's disease with complication, unspecified gastrointestinal tract location [facility])  - episodic pain, reports similar as prior.  Follow up with gastroenterologist discussed, with RTC/ER precautions if changes, fevers, or acute worsening.   Heart palpitations  - improved. Heart monitoring pending.   Counseled about COVID-19 virus infection  - vaccine questions discussed, resources provided.   Meds ordered this encounter  Medications  . verapamil (CALAN-SR) 180 MG CR tablet    Sig: Take  2 tablets (360 mg total) by mouth daily.    Dispense:  180 tablet    Refill:  1  . doxazosin (CARDURA) 8 MG tablet    Sig: Take 1 tablet (8 mg total) by mouth at bedtime.    Dispense:  90 tablet    Refill:  1  . olmesartan (BENICAR) 20 MG tablet    Sig: Take 1 tablet (20 mg total) by mouth daily.    Dispense:  90 tablet    Refill:  1   Patient Instructions   No med changes today.  Follow up with gastroenterology as planned, but if change in symptoms, worsening or any fever be seen right away.   COVID-19 Vaccine Information can be found at: ShippingScam.co.uk For questions related to vaccine distribution or appointments, please email [email or web address] or call [phone].   Here is a link to information about the COVID-19 vaccine: RecruitSuit.[email or web address]   If you have lab work done today you will be contacted with your lab results within the next 2 weeks.  If you have not heard from [place] then please contact us. The fastest way to get your results is to register for My Chart.   IF you received an x-ray today, you will receive an invoice from [facility]. Please contact [facility] at [phone] with questions or concerns regarding your invoice.   IF you received labwork today, you will receive an invoice from [facility]. Please contact [facility] at [phone] with questions or concerns regarding your invoice.   Our billing staff will not be able to assist you with questions regarding bills from these companies.  You will be contacted with the lab results as soon as they are available. The fastest way to get your results is to activate your My Chart account. Instructions are located on the last page of this paperwork. If you have not heard from [place] regarding the results in 2 weeks, please contact this office.         Signed, Merri Ray, MD [facility]

## 2019-09-11 LAB — COMPREHENSIVE METABOLIC PANEL
ALT: 52 IU/L — ABNORMAL HIGH (ref 0–44)
AST: 51 IU/L — ABNORMAL HIGH (ref 0–40)
Albumin/Globulin Ratio: 1.1 — ABNORMAL LOW (ref 1.2–2.2)
Albumin: 4.1 g/dL (ref 4.0–5.0)
Alkaline Phosphatase: 134 IU/L — ABNORMAL HIGH (ref 39–117)
BUN/Creatinine Ratio: 8 — ABNORMAL LOW (ref 9–20)
BUN: 9 mg/dL (ref 6–24)
Bilirubin Total: 0.4 mg/dL (ref 0.0–1.2)
CO2: 18 mmol/L — ABNORMAL LOW (ref 20–29)
Calcium: 8.8 mg/dL (ref 8.7–10.2)
Chloride: 106 mmol/L (ref 96–106)
Creatinine, Ser: 1.1 mg/dL (ref 0.76–1.27)
GFR calc Af Amer: 92 mL/min/{1.73_m2} (ref >59)
GFR calc non Af Amer: 80 mL/min/{1.73_m2} (ref >59)
Globulin, Total: 3.7 g/dL (ref 1.5–4.5)
Glucose: 92 mg/dL (ref 65–99)
Potassium: 4 mmol/L (ref 3.5–5.2)
Sodium: 139 mmol/L (ref 134–144)
Total Protein: 7.8 g/dL (ref 6.0–8.5)

## 2019-09-11 LAB — TSH: TSH: 1.61 u[IU]/mL (ref 0.450–4.500)

## 2019-09-11 LAB — LIPID PANEL
Chol/HDL Ratio: 5.9 ratio — ABNORMAL HIGH (ref 0.0–5.0)
Cholesterol, Total: 189 mg/dL (ref 100–199)
HDL: 32 mg/dL — ABNORMAL LOW (ref >39)
LDL Chol Calc (NIH): 128 mg/dL — ABNORMAL HIGH (ref 0–99)
Triglycerides: 160 mg/dL — ABNORMAL HIGH (ref 0–149)
VLDL Cholesterol Cal: 29 mg/dL (ref 5–40)

## 2019-09-11 LAB — HEMOGLOBIN A1C
Est. average glucose Bld gHb Est-mCnc: 117 mg/dL
Hgb A1c MFr Bld: 5.7 % — ABNORMAL HIGH (ref 4.8–5.6)

## 2019-09-16 ENCOUNTER — Telehealth: Payer: Self-pay | Admitting: Internal Medicine

## 2019-09-16 NOTE — Telephone Encounter (Signed)
Spoke to the patient who is c/o RLQ pain. He states sometimes this intermittent stabbing pain last for 5 minutes up to 24 hours. He states he's been experiencing this pain for years but over the last 30 days, it has become more frequent (every 2-3 days). He reports sometimes, the pain is relieved with defecation, sometimes not. Denies N/V. When asked if he's been febrile, he stated "sometimes it feels like my neck is hot." No measured fever. He also reports when he is experiencing the RLQ pain, " the spot that hurts is hot to the touch." He does endorse diarrhea every 3-4 days, with intermittent normal stools and blood only if he has had diarrhea several times in one day. He is scheduled for a follow up visit with you on 10/14/19 at 10:30 am. Please advise?

## 2019-09-16 NOTE — Telephone Encounter (Signed)
Use my 3/24 hemorrhoid banding slot for him instead of 4/13

## 2019-09-16 NOTE — Telephone Encounter (Signed)
Patient scheduled on 3/23 at 3:50 pm (3/24 banding slot mistake). Confirmed with [name], okay with date and time change. Patient aware. Patient told to journal episodes of pain over the next week to discuss with [name]. Verbalized understanding.

## 2019-09-16 NOTE — Telephone Encounter (Signed)
Pt reported that he is experiencing RLQ pain.  Please call back to discuss.

## 2019-09-21 IMAGING — DX DG CHEST 2V
3 series · 3 of 3 positions shown · non-contrast
Comparison: 02/11/2018

CLINICAL DATA: Shortness of breath. Palpitations. Recent angio
edema.

EXAM:
CHEST - 2 VIEW

[chest pa (1 of 2)]
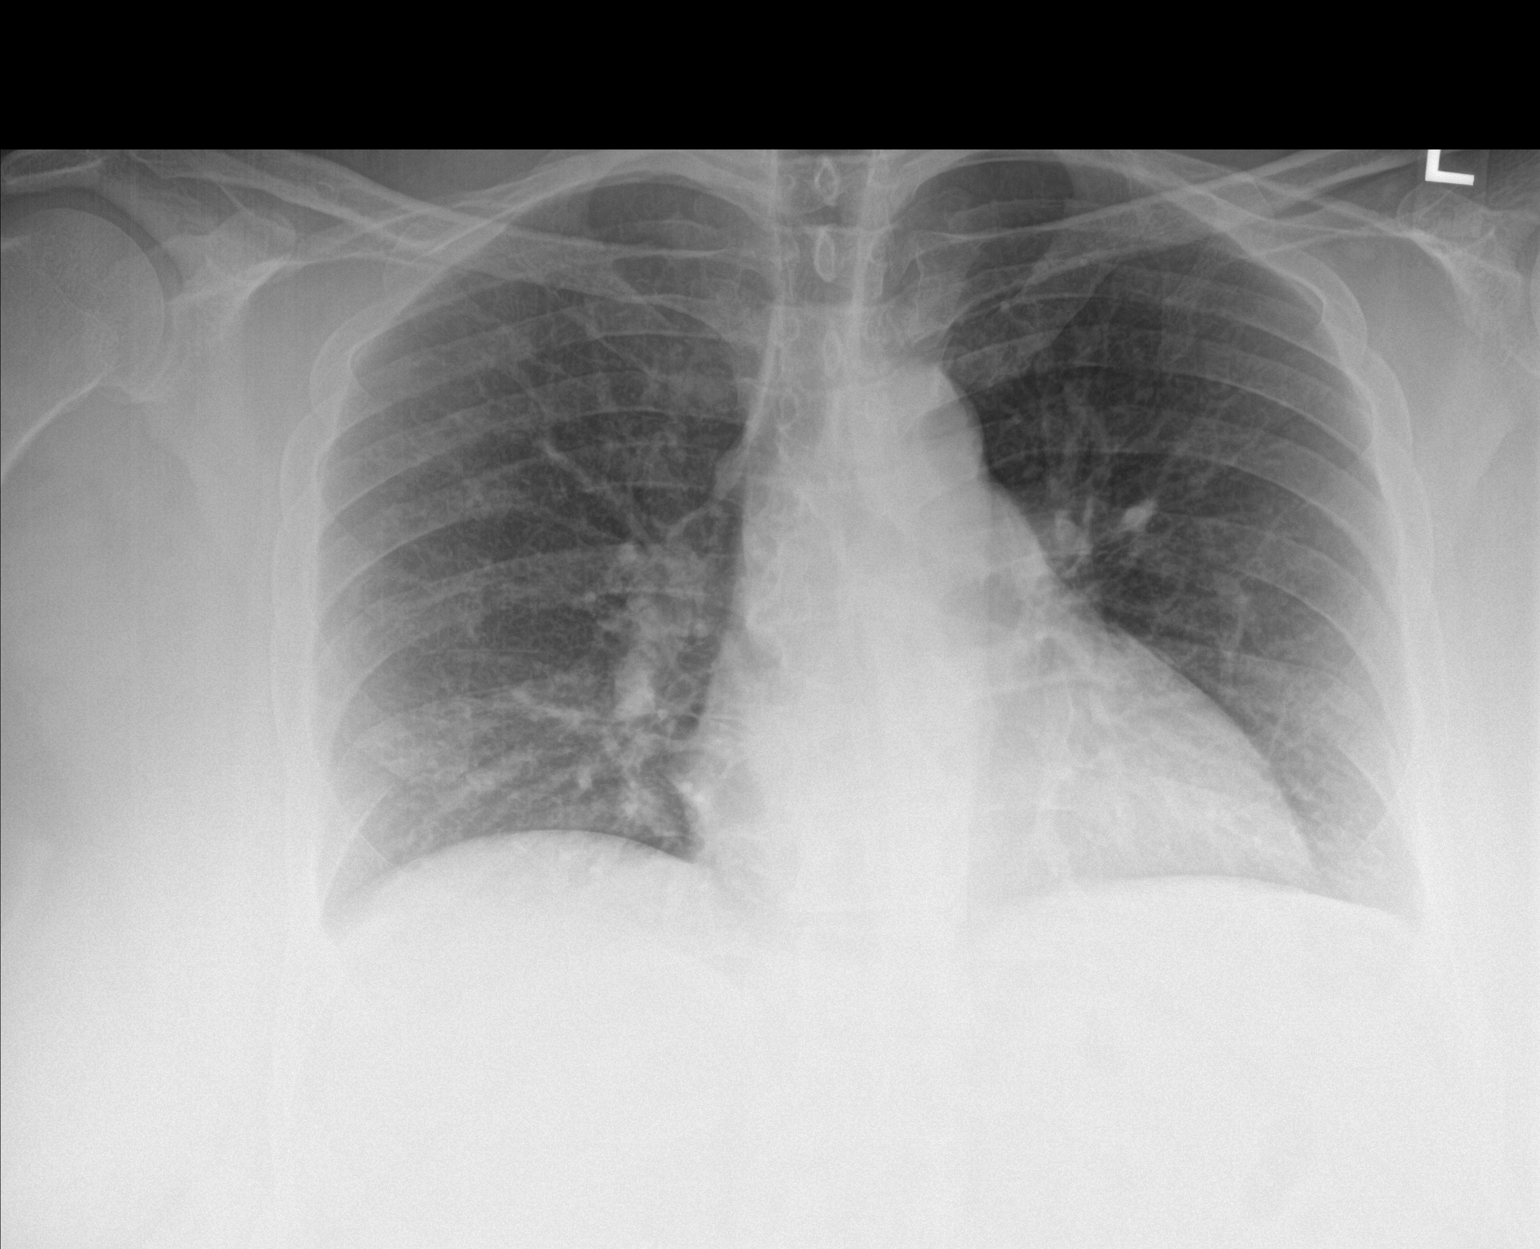

[chest lat]
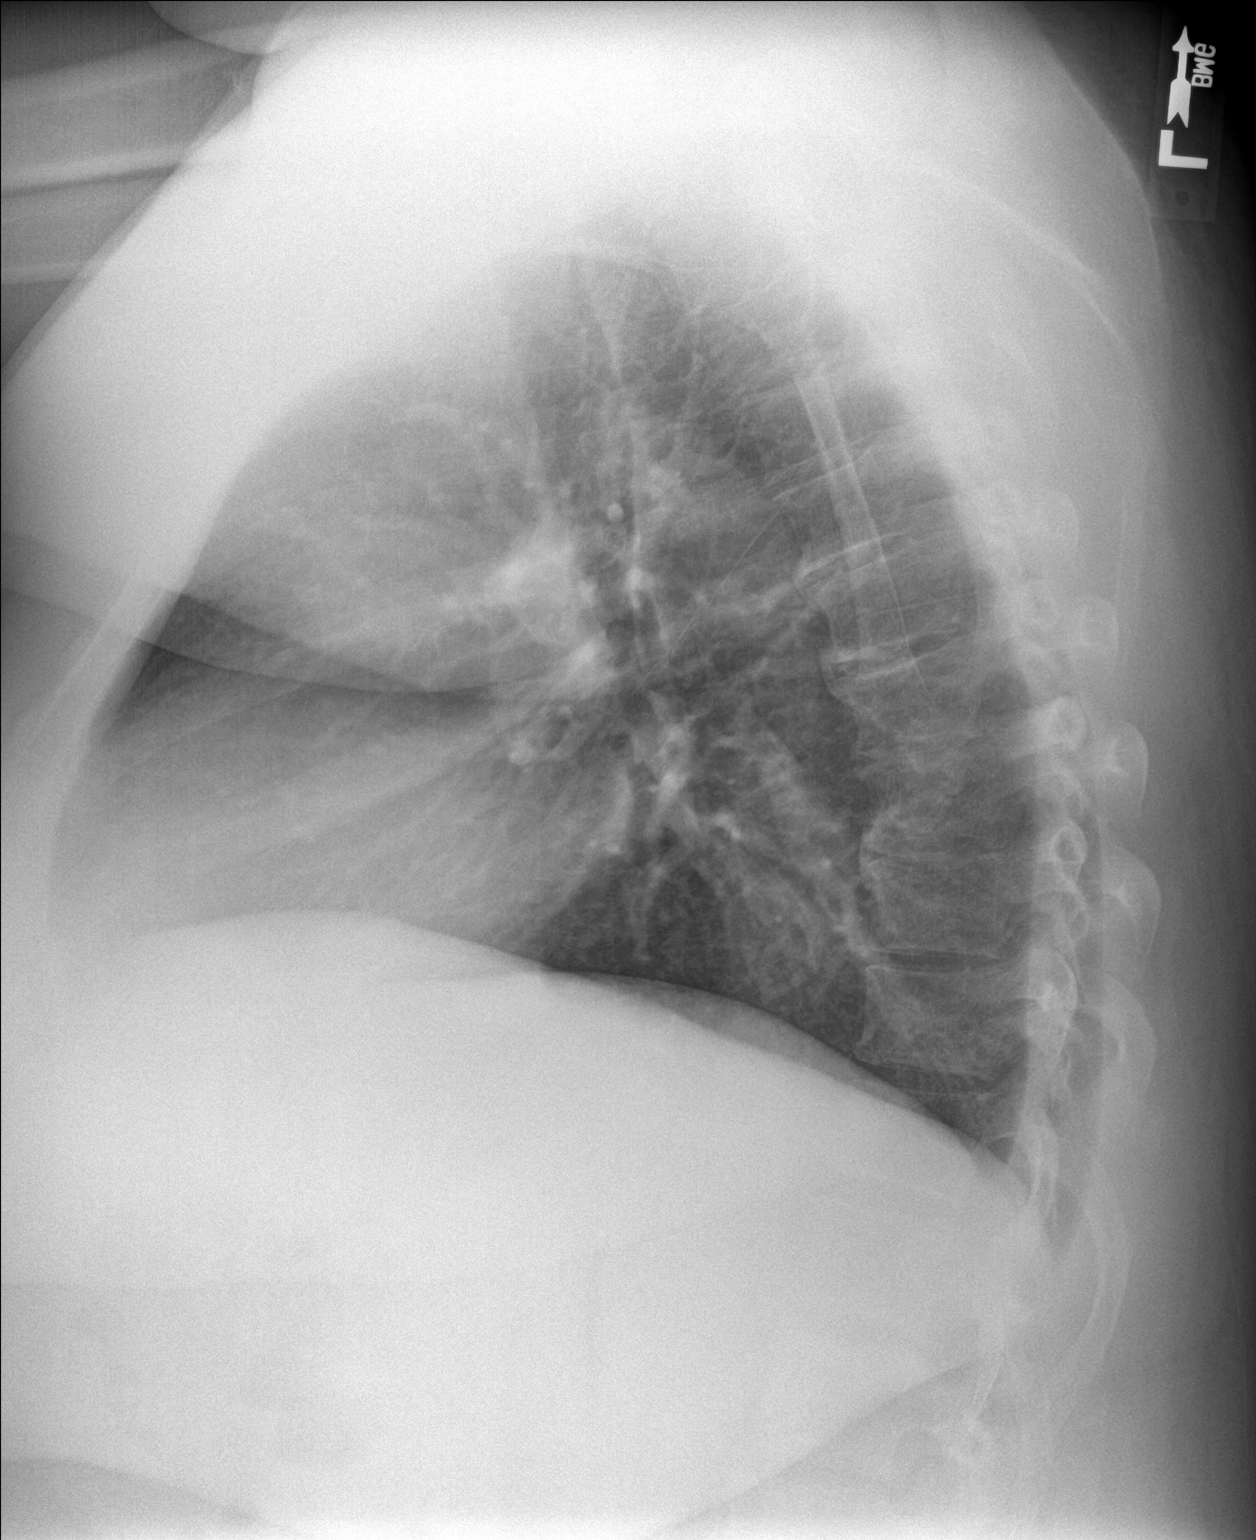

[chest pa (2 of 2)]
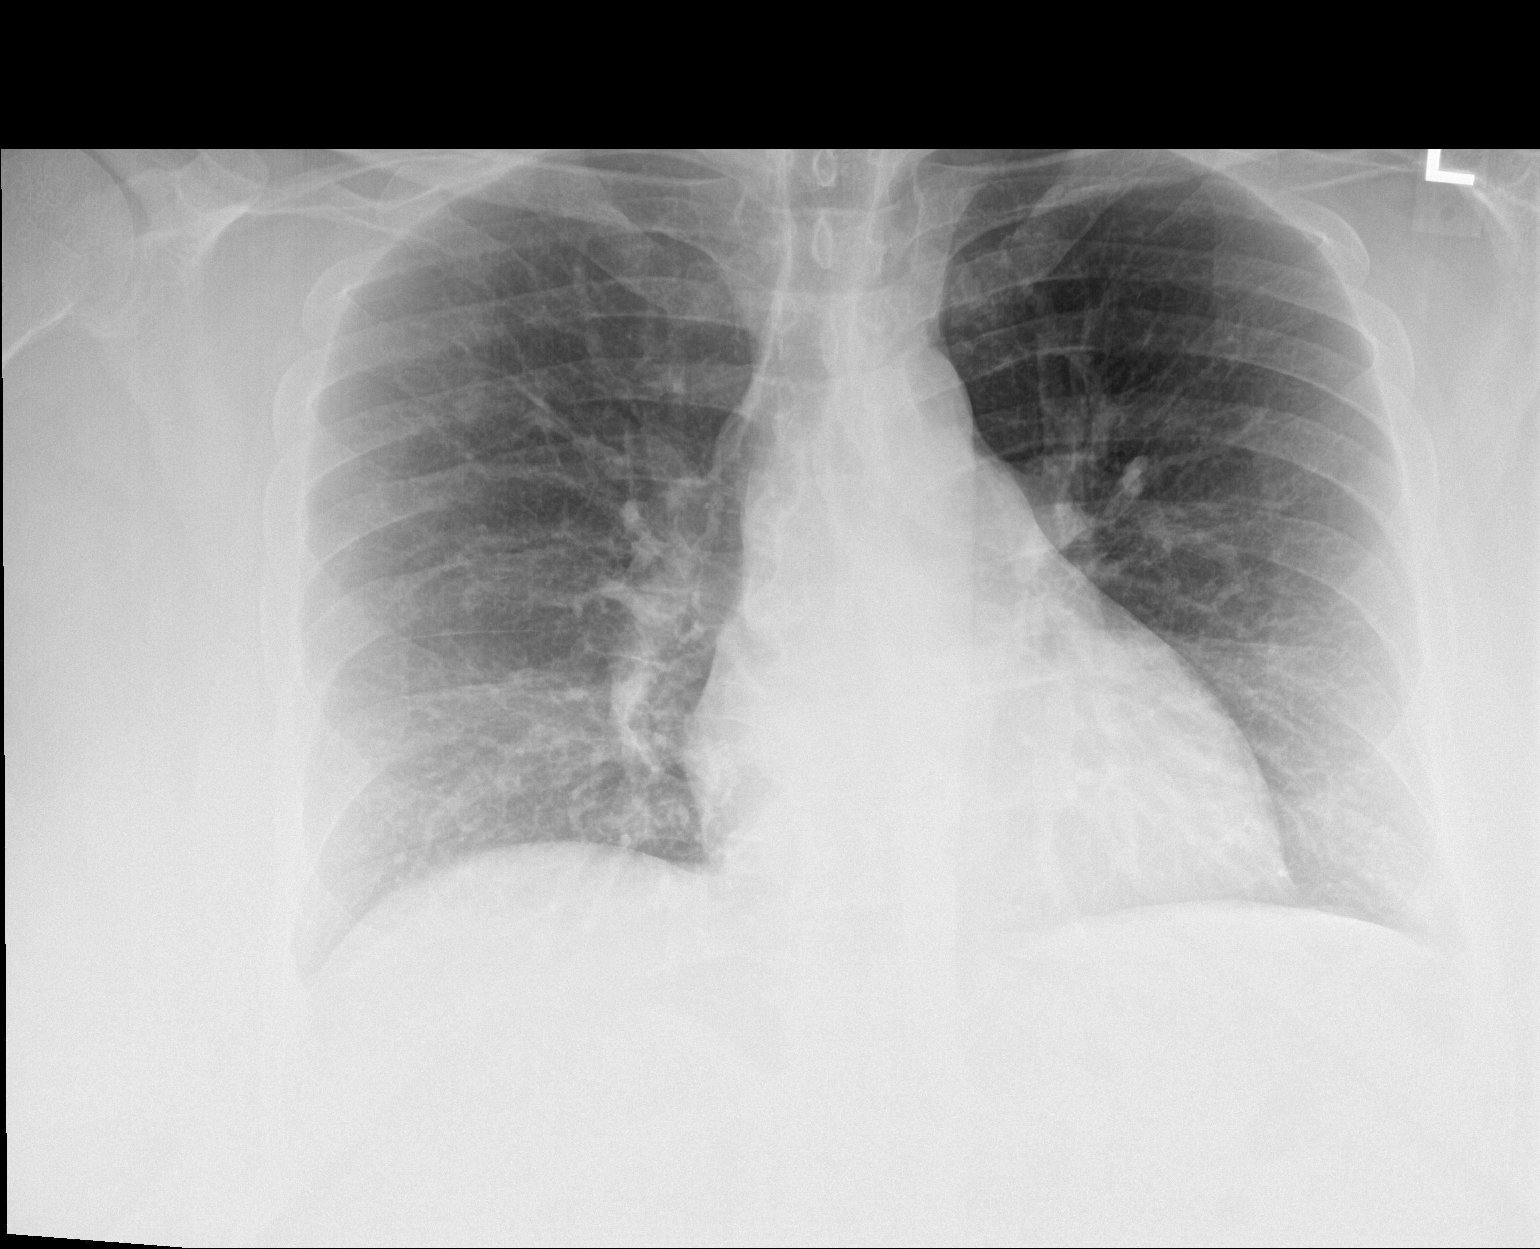

[3 of 3 positions shown; findings below may reference images not displayed]

FINDINGS: The cardiomediastinal silhouette is unchanged with top normal heart
size. The lungs are mildly hypoinflated with mild diffuse prominence
of the bronchovascular markings but no overt edema, focal airspace
consolidation, pleural effusion, or pneumothorax identified. No
acute osseous abnormality is seen.
IMPRESSION: No active cardiopulmonary disease.

## 2019-09-23 ENCOUNTER — Other Ambulatory Visit (INDEPENDENT_AMBULATORY_CARE_PROVIDER_SITE_OTHER): Payer: 59

## 2019-09-23 ENCOUNTER — Ambulatory Visit (INDEPENDENT_AMBULATORY_CARE_PROVIDER_SITE_OTHER): Payer: 59 | Admitting: Internal Medicine

## 2019-09-23 ENCOUNTER — Encounter: Payer: Self-pay | Admitting: Internal Medicine

## 2019-09-23 VITALS — BP 118/78 | HR 107 | Temp 98.1°F | Ht 72.0 in | Wt 357.0 lb

## 2019-09-23 DIAGNOSIS — K50018 Crohn's disease of small intestine with other complication: Secondary | ICD-10-CM

## 2019-09-23 DIAGNOSIS — R10813 Right lower quadrant abdominal tenderness: Secondary | ICD-10-CM

## 2019-09-23 DIAGNOSIS — R1031 Right lower quadrant pain: Secondary | ICD-10-CM

## 2019-09-23 DIAGNOSIS — R945 Abnormal results of liver function studies: Secondary | ICD-10-CM | POA: Diagnosis not present

## 2019-09-23 DIAGNOSIS — R7989 Other specified abnormal findings of blood chemistry: Secondary | ICD-10-CM

## 2019-09-23 LAB — CBC WITH DIFFERENTIAL/PLATELET
Basophils Absolute: 0 10*3/uL (ref 0.0–0.1)
Basophils Relative: 0.6 % (ref 0.0–3.0)
Eosinophils Absolute: 0.1 10*3/uL (ref 0.0–0.7)
Eosinophils Relative: 2.5 % (ref 0.0–5.0)
HCT: 43.7 % (ref 39.0–52.0)
Hemoglobin: 14.4 g/dL (ref 13.0–17.0)
Lymphocytes Relative: 38.3 % (ref 12.0–46.0)
Lymphs Abs: 2 10*3/uL (ref 0.7–4.0)
MCHC: 33 g/dL (ref 30.0–36.0)
MCV: 80.8 fl (ref 78.0–100.0)
Monocytes Absolute: 0.7 10*3/uL (ref 0.1–1.0)
Monocytes Relative: 13 % — ABNORMAL HIGH (ref 3.0–12.0)
Neutro Abs: 2.4 10*3/uL (ref 1.4–7.7)
Neutrophils Relative %: 45.6 % (ref 43.0–77.0)
Platelets: 231 10*3/uL (ref 150.0–400.0)
RBC: 5.41 Mil/uL (ref 4.22–5.81)
RDW: 15.5 % (ref 11.5–15.5)
WBC: 5.3 10*3/uL (ref 4.0–10.5)

## 2019-09-23 LAB — HEPATIC FUNCTION PANEL
ALT: 50 U/L (ref 0–53)
AST: 49 U/L — ABNORMAL HIGH (ref 0–37)
Albumin: 4 g/dL (ref 3.5–5.2)
Alkaline Phosphatase: 124 U/L — ABNORMAL HIGH (ref 39–117)
Bilirubin, Direct: 0.2 mg/dL (ref 0.0–0.3)
Total Bilirubin: 0.5 mg/dL (ref 0.2–1.2)
Total Protein: 7.8 g/dL (ref 6.0–8.3)

## 2019-09-23 LAB — SEDIMENTATION RATE: Sed Rate: 69 mm/hr — ABNORMAL HIGH (ref 0–15)

## 2019-09-23 LAB — HIGH SENSITIVITY CRP: CRP, High Sensitivity: 1.69 mg/L (ref 0.000–5.000)

## 2019-09-23 NOTE — Progress Notes (Signed)
Craig Guzman. 49 y.o. 1970-11-19 [id number]  Assessment & Plan:   Encounter Diagnoses  Name Primary?  . RLQ abdominal pain Yes  . Right lower quadrant abdominal tenderness without rebound tenderness   . Gastroduodenal Crohn's disease with other complication ([place])   . Abnormal LFTs     The characterization of the pain almost sounds neuropathic, he does have some tenderness the exam is not alarming.  Will work-up with labs to include CBC sed rate and C-reactive protein.  Recheck LFTs also as they were elevated recently he could need a CT scan.  He could have epiploic appendagitis perhaps, some type of panniculitis, or it could be his Crohn's disease.  It is probably not neuropathic given the focal nature.  CC: [name], MD Leta Baptist, MD    Subjective:   Chief Complaint: Right lower quadrant pain  HPI Craig Guzman is a 49 year old man with a history of gastroduodenal Crohn's disease with a history of some right colonic disease years ago, currently off therapy who has had a months worth of a burning sometimes intense focal right lower quadrant pain and tenderness.  It comes and goes its not necessarily related to eating.  It can be there for hours.  If not something he has had before.  He has chronic issues of regurgitation, he thinks some of his vomiting may be related to post nasal drip and sinus drainage and gagging, but he still has to turn to his left after eating or the food sits there status post resection of duodenal stricture.  Other issues that continue are abdominal discomfort and generalized cramping that is not severe but persists and last for about an hour after defecation such that he has to just sit still and wait for the past.  He has been having palpitations when lying down and is on a cardiac event monitor.  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  Transaminases were done  on 09/10/2019 with visit to Dr. Nyoka Cowden and AST 51 ALT 52 and alk phos 134 as well with normal bilirubin  Allergies  Allergen Reactions  . Zithromax [Azithromycin] Hypertension  . Lisinopril Swelling    SWELLING REACTION UNSPECIFIED    Current Meds  Medication Sig  . cyclobenzaprine (FLEXERIL) 10 MG tablet Take 10 mg by mouth 3 (three) times daily as needed (migraines).   Marland Kitchen doxazosin (CARDURA) 8 MG tablet Take 1 tablet (8 mg total) by mouth at bedtime.  Marland Kitchen olmesartan (BENICAR) 20 MG tablet Take 1 tablet (20 mg total) by mouth daily.  . ondansetron (ZOFRAN-ODT) 4 MG disintegrating tablet Take 1 tablet (4 mg total) by mouth every 8 (eight) hours as needed for nausea or vomiting.  . prochlorperazine (COMPAZINE) 25 MG suppository Place 1 suppository (25 mg total) rectally every 12 (twelve) hours as needed for nausea or vomiting.  . rizatriptan (MAXALT-MLT) 10 MG disintegrating tablet Take 1 tablet (10 mg total) by mouth 3 (three) times daily as needed for migraine.  . verapamil (CALAN-SR) 180 MG CR tablet Take 2 tablets (360 mg total) by mouth daily.   Past Medical History:  Diagnosis Date  . Allergy    trees/ grasses/ animals/dust/mold  . Biliary dyskinesia   . Colloid thyroid nodule   . Crohn's disease ([place])    Stomach, terminal ileum, cecum  . Crohn's disease ([place])   . Dyspnea    due to nasal /sinus congestion  . Gastric ulcer  antral  . GERD (gastroesophageal reflux disease)   . 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 History of migraine headaches   . HTN (hypertension)   . Kidney stone 09/20/2012  . Migraine   . Obesity   . Pneumonia 02/27/2014   ED  . Postsurgical dumping syndrome 02/14/2019  . Sleep apnea    has cpap- does not wear    Past Surgical History:  Procedure Laterality Date  . BALLOON DILATION N/A 12/06/2017   Procedure: BALLOON DILATION;  Surgeon: Gatha Mayer, MD;  Location: [facility];  Service: Endoscopy;   Laterality: N/A;  . BIOPSY  06/21/2018   Procedure: BIOPSY;  Surgeon: Gatha Mayer, MD;  Location: [facility];  Service: Endoscopy;;  . CHOLECYSTECTOMY  2011   [place]  . CIRCUMCISION N/A 12/09/2018   Procedure: CIRCUMCISION ADULT;  Surgeon: Lucas Mallow, MD;  Location: [facility];  Service: Urology;  Laterality: N/A;  . COLONOSCOPY  07/26/11   Crohn's colitis, ileitis  . ESOPHAGOGASTRODUODENOSCOPY  05/04/11, 07/26/11   granulomatous gastritis - Crohn's  . ESOPHAGOGASTRODUODENOSCOPY (EGD) WITH PROPOFOL N/A 03/28/2017   Procedure: ESOPHAGOGASTRODUODENOSCOPY (EGD) WITH PROPOFOL  with balloon dilation of duodenum;  Surgeon: [name], MD;  Location: [facility];  Service: Gastroenterology;  Laterality: N/A;  . ESOPHAGOGASTRODUODENOSCOPY (EGD) WITH PROPOFOL N/A 12/06/2017   Procedure: ESOPHAGOGASTRODUODENOSCOPY (EGD) WITH PROPOFOL;  Surgeon: Gatha Mayer, MD;  Location: [facility];  Service: Endoscopy;  Laterality: N/A;  . ESOPHAGOGASTRODUODENOSCOPY (EGD) WITH PROPOFOL N/A 06/21/2018   Procedure: ESOPHAGOGASTRODUODENOSCOPY (EGD) WITH PROPOFOL;  Surgeon: Gatha Mayer, MD;  Location: [facility];  Service: Endoscopy;  Laterality: N/A;  . ETHMOIDECTOMY N/A 11/27/2018   Procedure: ENDOSCOPIC TOTAL ETHMOIDECTOMY;  Surgeon: Leta Baptist, MD;  Location: [facility];  Service: ENT;  Laterality: N/A;  . FLEXIBLE SIGMOIDOSCOPY    . GASTROJEJUNOSTOMY  01/2018  . MULTIPLE TOOTH EXTRACTIONS  2005  . NASAL SEPTOPLASTY W/ TURBINOPLASTY Bilateral 11/27/2018   Procedure: NASAL SEPTOPLASTY WITH BILATERAL TURBINATE REDUCTION;  Surgeon: Leta Baptist, MD;  Location: [facility];  Service: ENT;  Laterality: Bilateral;  . SHOULDER SURGERY     right  . SINUS ENDO WITH FUSION N/A 11/27/2018   Procedure: SINUS ENDO WITH FUSION;  Surgeon: Leta Baptist, MD;  Location: [facility];  Service: ENT;  Laterality: N/A;  . UPPER GASTROINTESTINAL ENDOSCOPY    . VASECTOMY     bilateral w/lysis of penile adhesions   Social History    Social History Narrative   Married. Education: [employer].    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   Review of Systems As per HPI  Objective:   Physical Exam BP 118/78   Pulse (!) 107   Temp 98.1 F (36.7 C)   Ht 6' (1.829 m)   Wt (!) 357 lb (161.9 kg)   BMI 48.42 kg/m  Obese jovial black man in no acute distress The abdomen is obese soft and he does have focal right mid to lower quadrant tenderness not really McBurney's point, there are no peritoneal signs there  is no guarding or rebound.  There is a rather large pannus.

## 2019-09-23 NOTE — Patient Instructions (Signed)
If you are age 48 or older, your body mass index should be between 23-30. Your Body mass index is 48.42 kg/m. If this is out of the aforementioned range listed, please consider follow up with your Primary Care Provider.  If you are age 32 or younger, your body mass index should be between 19-25. Your Body mass index is 48.42 kg/m. If this is out of the aformentioned range listed, please consider follow up with your Primary Care Provider.   Your provider has requested that you go to the basement level for lab work before leaving today. Press "B" on the elevator. The lab is located at the first door on the left as you exit the elevator.  It was a pleasure to see you today!

## 2019-09-24 ENCOUNTER — Other Ambulatory Visit: Payer: Self-pay | Admitting: Internal Medicine

## 2019-09-24 DIAGNOSIS — R7989 Other specified abnormal findings of blood chemistry: Secondary | ICD-10-CM

## 2019-09-24 DIAGNOSIS — R945 Abnormal results of liver function studies: Secondary | ICD-10-CM

## 2019-10-01 ENCOUNTER — Other Ambulatory Visit: Payer: 59

## 2019-10-01 DIAGNOSIS — R945 Abnormal results of liver function studies: Secondary | ICD-10-CM

## 2019-10-01 DIAGNOSIS — R7989 Other specified abnormal findings of blood chemistry: Secondary | ICD-10-CM

## 2019-10-02 LAB — HEPATITIS C ANTIBODY
Hepatitis C Ab: NONREACTIVE
SIGNAL TO CUT-OFF: 0.03 (ref <1.00)

## 2019-10-14 ENCOUNTER — Ambulatory Visit: Payer: Self-pay | Admitting: Internal Medicine

## 2019-10-16 IMAGING — DX DG RIBS W/ CHEST 3+V*R*
5 series · 5 of 5 positions shown · non-contrast
Comparison: Chest radiograph May 15, 2018

CLINICAL DATA: Chest pain

EXAM:
RIGHT RIBS AND CHEST - 3+ VIEW

[chest pa]
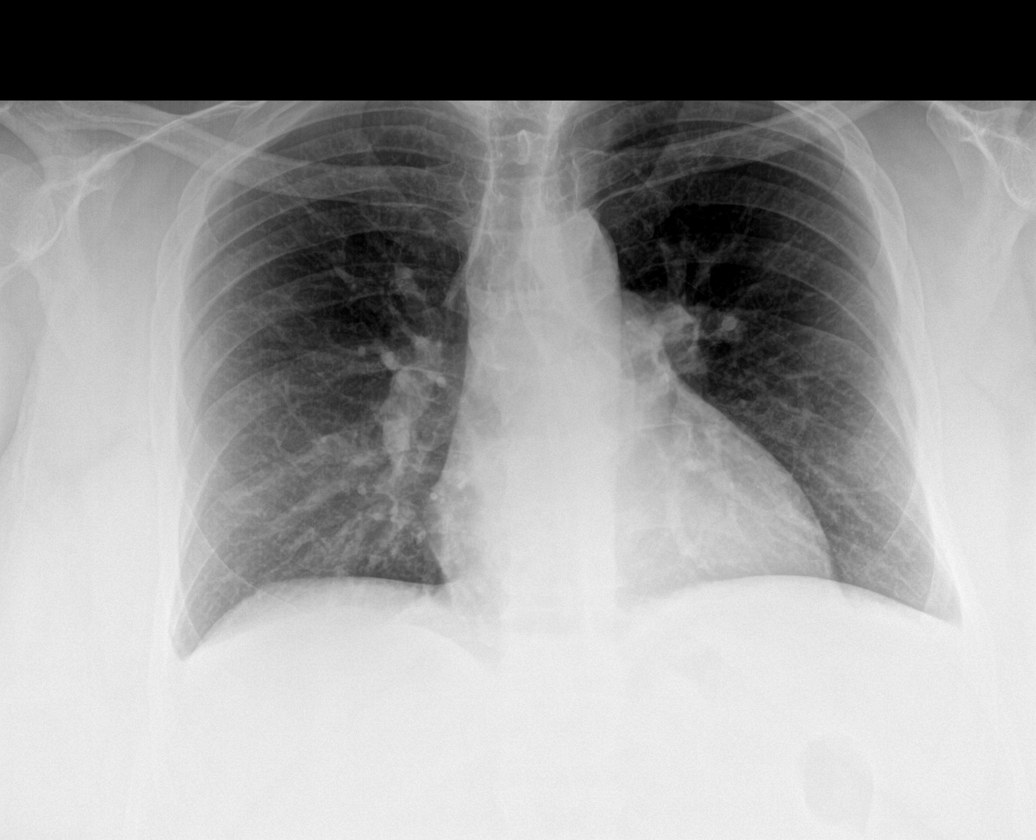

[rib pa]
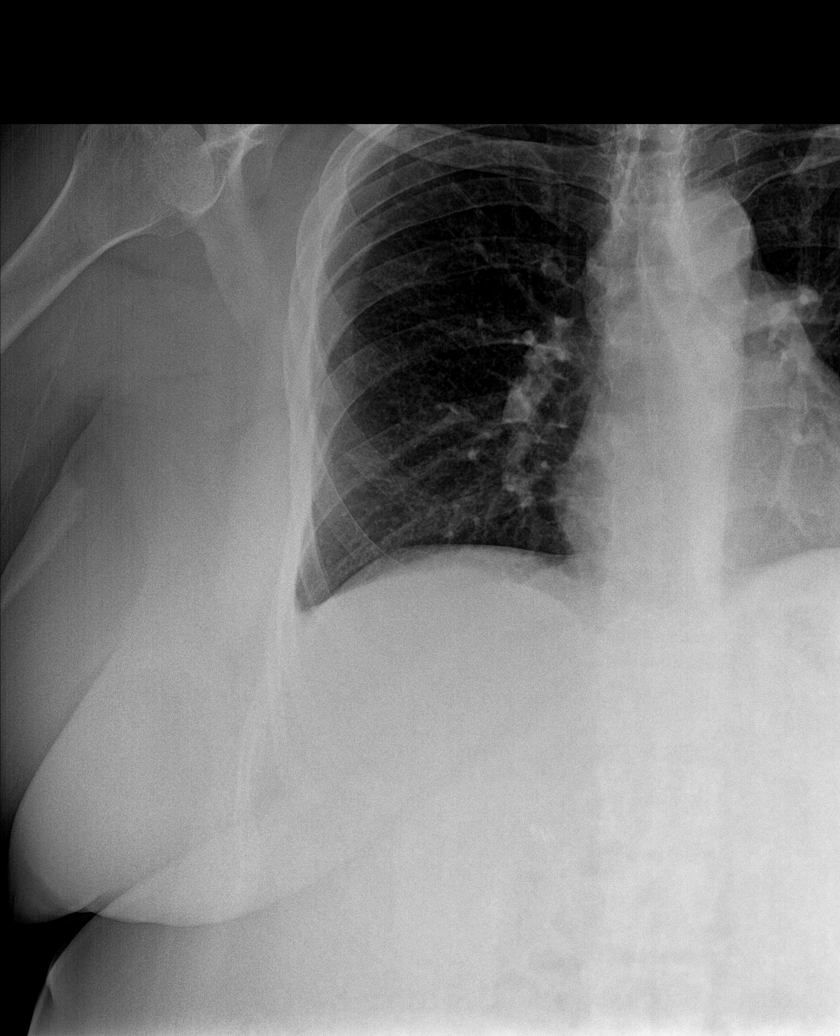

[rib obl (1 of 3)]
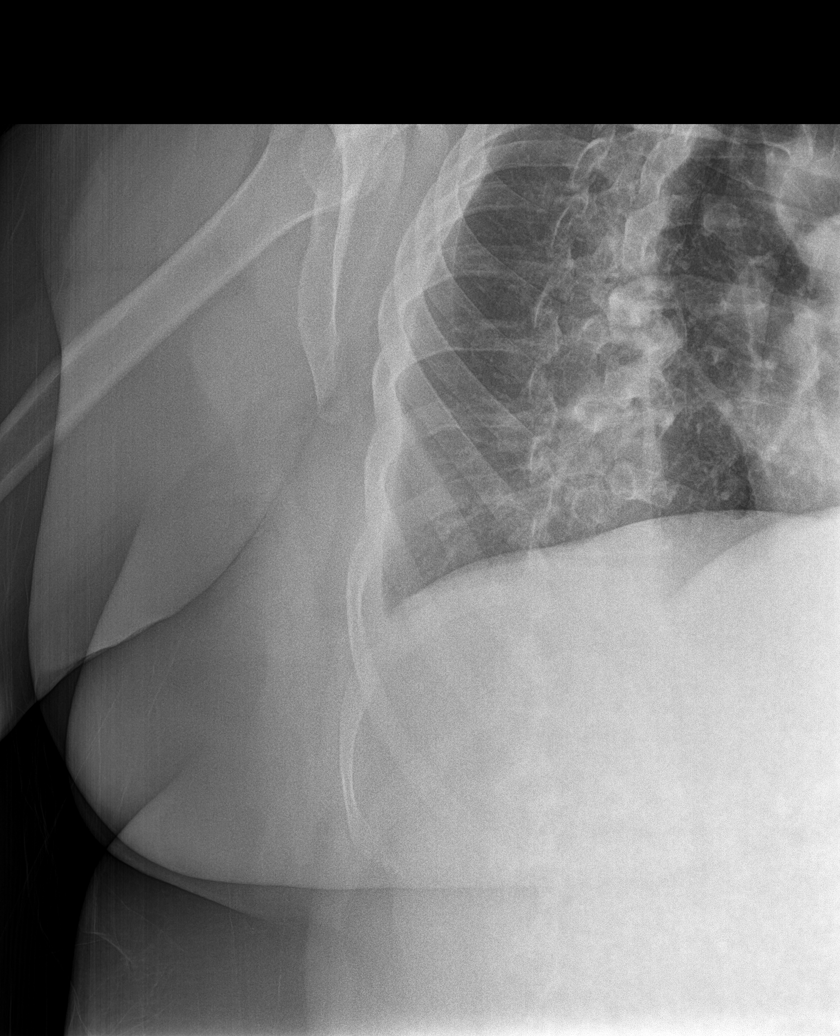

[rib obl (2 of 3)]
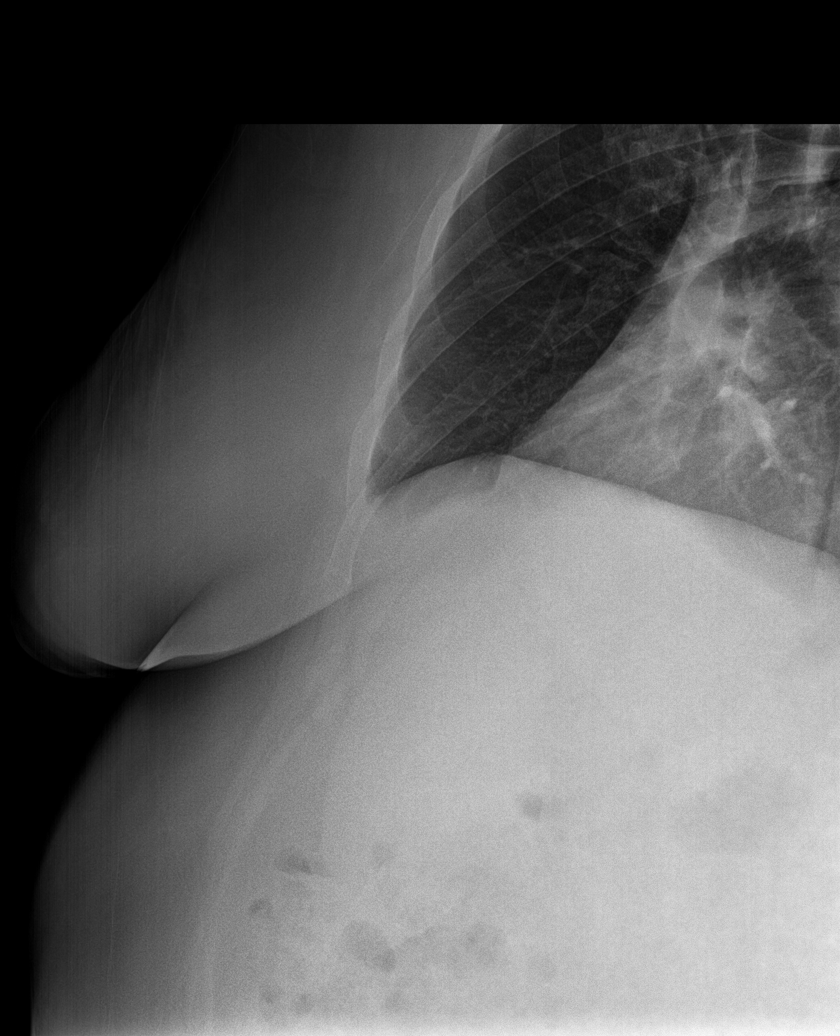

[rib obl (3 of 3)]
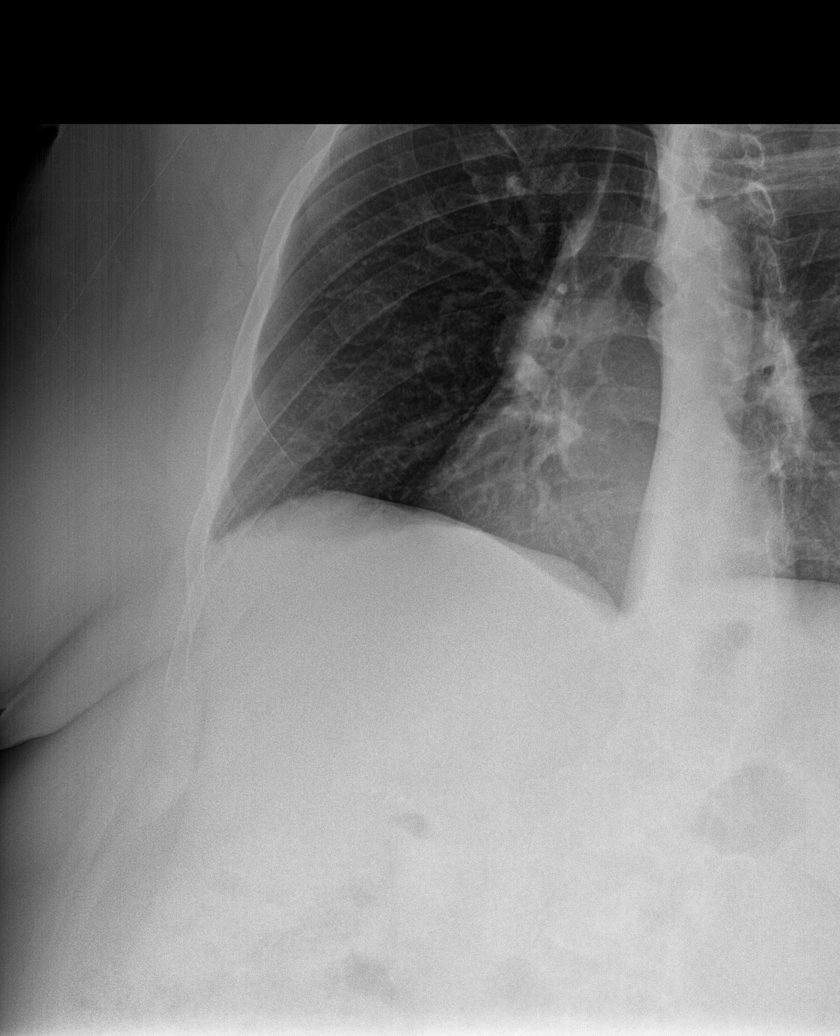

[5 of 5 positions shown; findings below may reference images not displayed]

FINDINGS: Frontal chest as well as oblique and cone-down rib images were
obtained. There is no edema or consolidation. Heart size and
pulmonary vascularity are normal. No adenopathy. There is no evident
pneumothorax or pleural effusion. No evident rib fracture.
IMPRESSION: No evident rib fracture. No edema or consolidation. No pneumothorax.

## 2019-10-22 ENCOUNTER — Other Ambulatory Visit: Payer: Self-pay

## 2019-10-22 DIAGNOSIS — I4729 Other ventricular tachycardia: Secondary | ICD-10-CM

## 2019-10-22 DIAGNOSIS — I472 Ventricular tachycardia: Secondary | ICD-10-CM

## 2019-10-22 IMAGING — CT CT RENAL STONE PROTOCOL
2 of 4 series · 14 of 46 positions shown, 16 images · non-contrast
Comparison: Multiple prior exams, most recent 03/27/2017 and most
remote available 06/26/2011.

CLINICAL DATA: 47-year-old male with right flank pain extending to
right lower quadrant. Past 2 kidney stones or early this week. Prior
gastric surgery. History of Crohn's disease. Prior cholecystectomy.
Hypertension. Initial encounter.

EXAM:
CT ABDOMEN AND PELVIS WITHOUT CONTRAST
TECHNIQUE: Multidetector CT imaging of the abdomen and pelvis was performed
following the standard protocol without IV contrast.

[Series 2: renal stone 5.00 br40 s3 ax · axial · 0.86mm/px · z∈[+1053,+1473]mm · 11 of 102 slices shown, 13 images]
[im 9/102  soft-tissue]
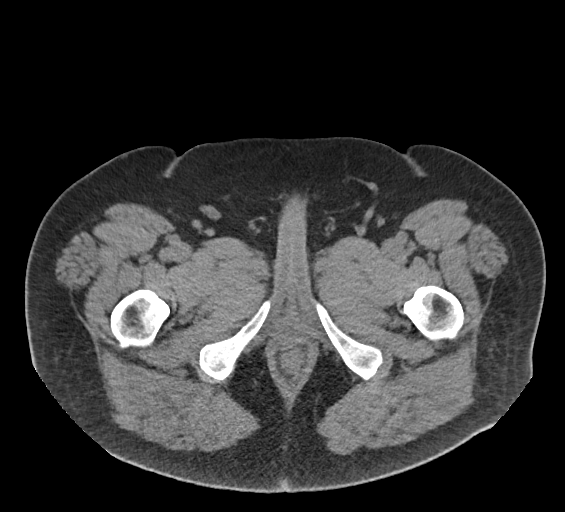
[im 9/102  bone]
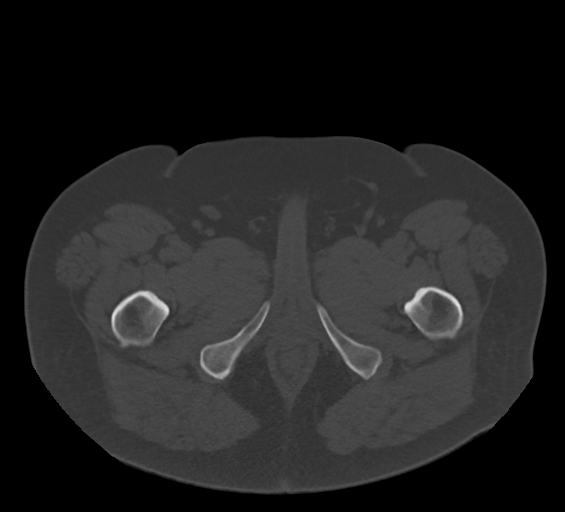
[im 17/102  soft-tissue]
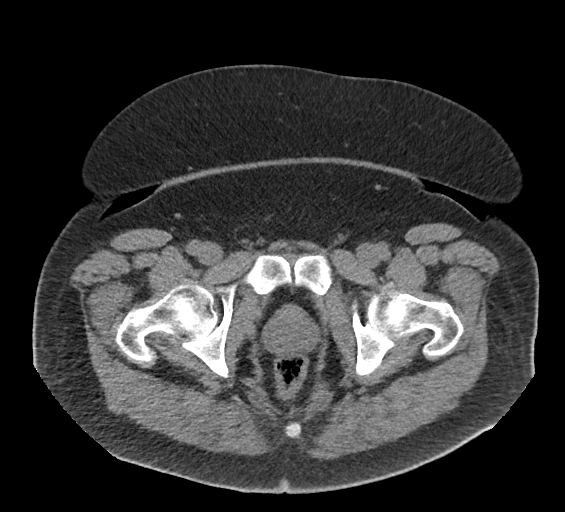
[im 25/102  soft-tissue]
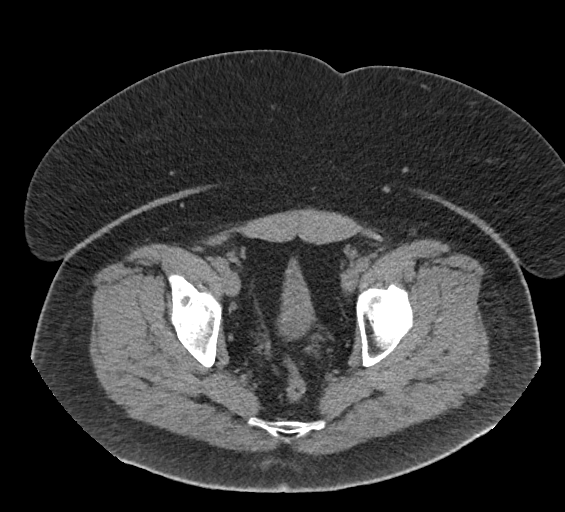
[im 33/102  soft-tissue]
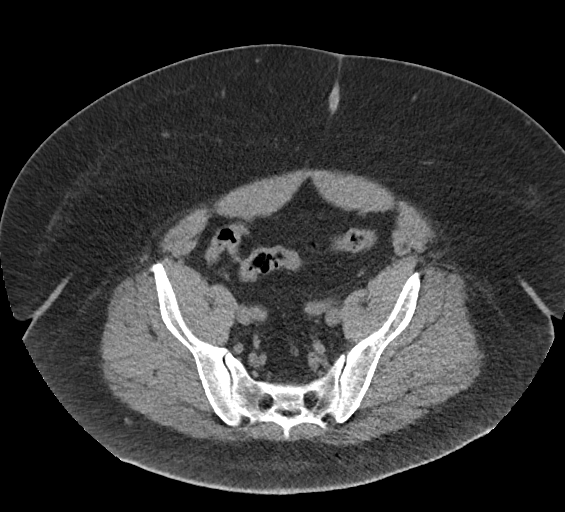
[im 41/102  soft-tissue]
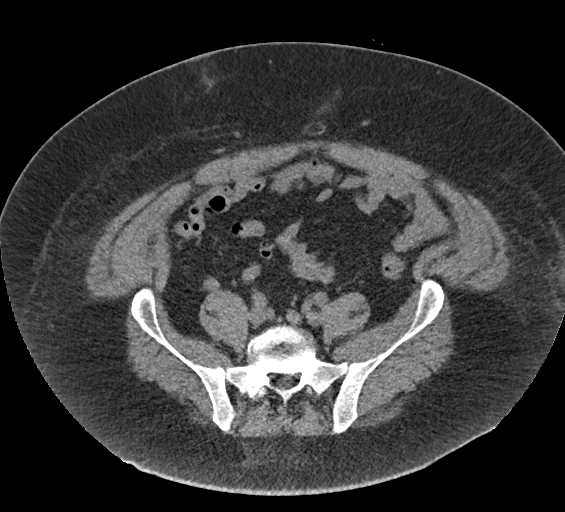
[im 53/102  soft-tissue]
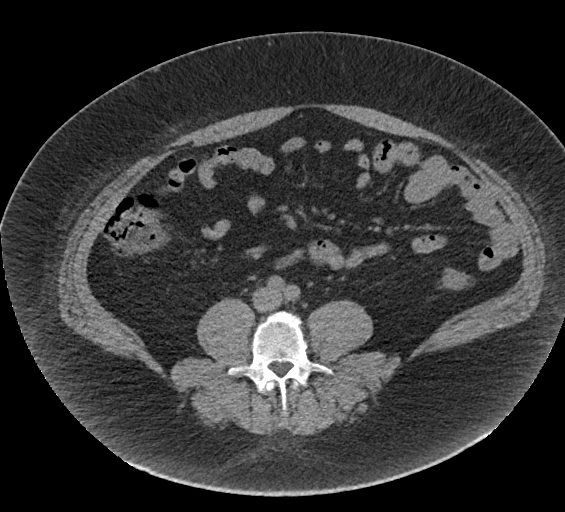
[im 61/102  soft-tissue]
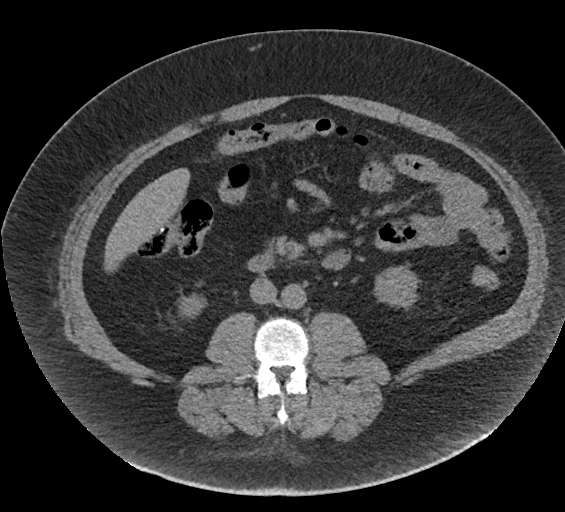
[im 69/102  soft-tissue]
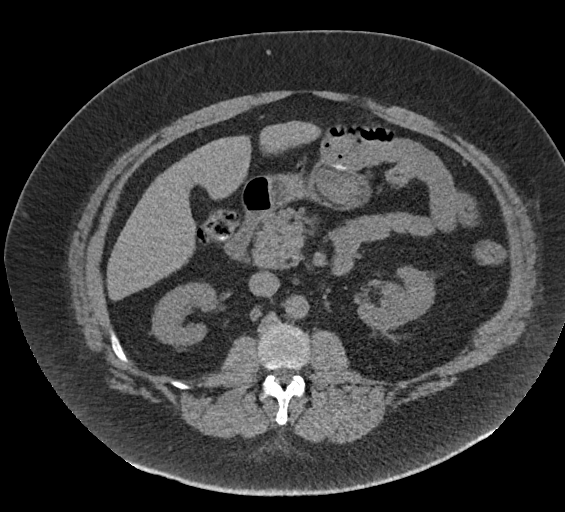
[im 77/102  soft-tissue]
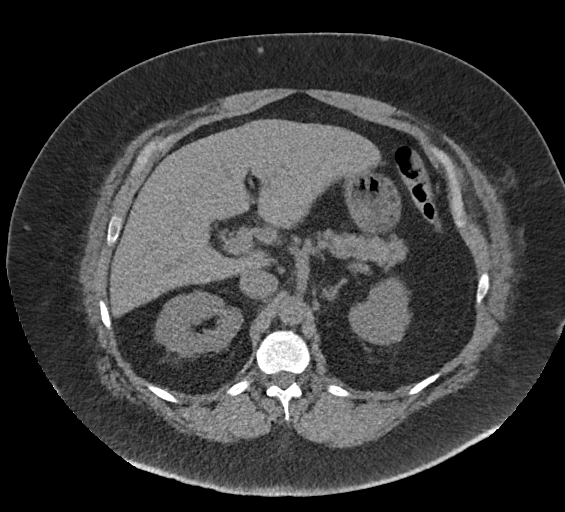
[im 77/102  bone]
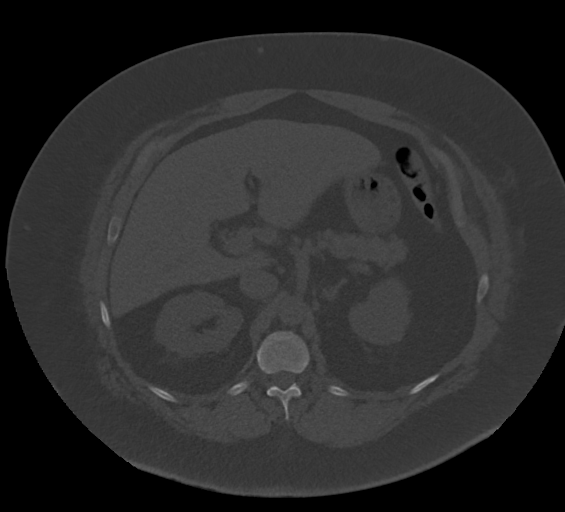
[im 85/102  soft-tissue]
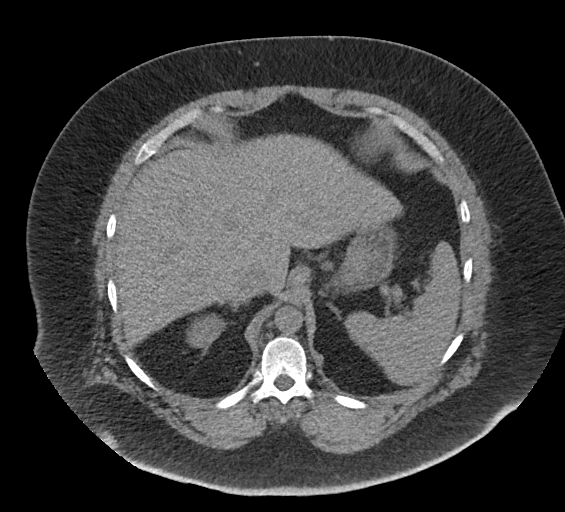
[im 93/102  soft-tissue]
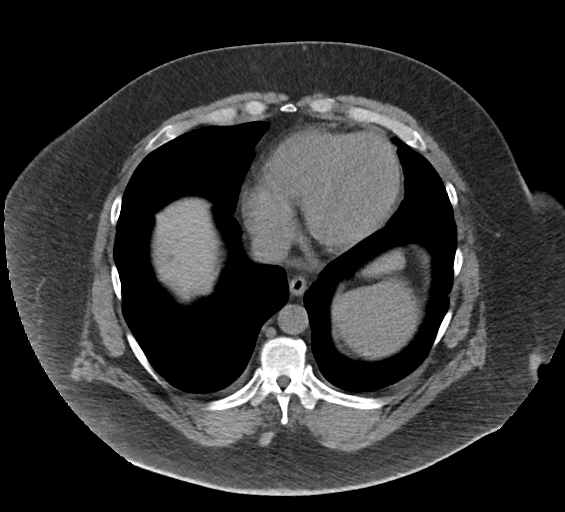

[Series 6: renal stone 2.00 br40 s3 cor · coronal · 0.95mm/px · 3 of 222 slices shown]
[im 74/222  soft-tissue]
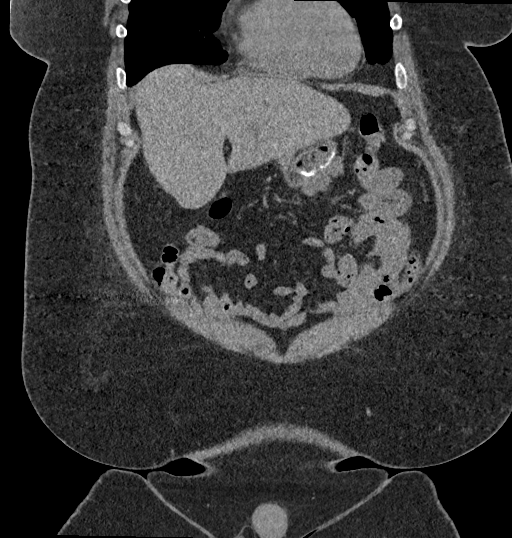
[im 99/222  soft-tissue]
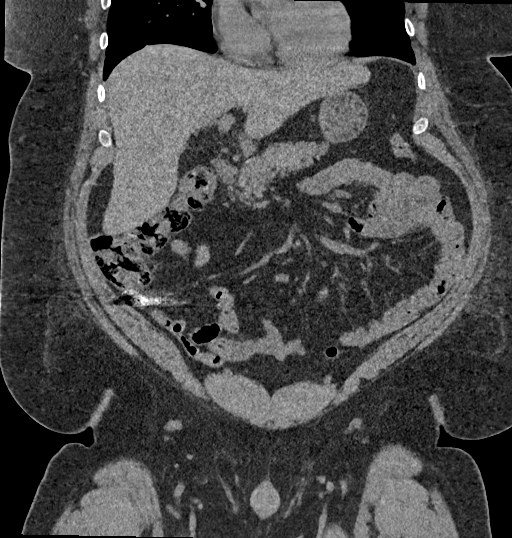
[im 123/222  soft-tissue]
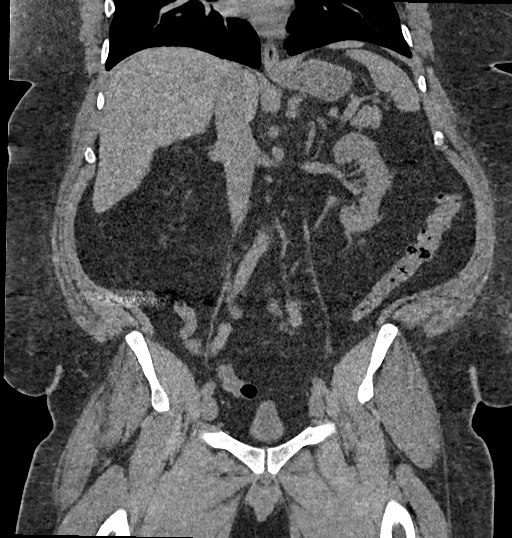

[14 of 46 positions shown; findings below may reference images not displayed]

FINDINGS: Lower chest: Minimal scarring lung bases. Heart size within normal
limits.

Hepatobiliary: Taking into account limitation by non contrast
imaging, no worrisome hepatic lesion. Post cholecystectomy.

Pancreas: Taking into account limitation by non contrast imaging, no
worrisome pancreatic mass or inflammation.

Spleen: Taking into account limitation by non contrast imaging, no
splenic mass or enlargement.

Adrenals/Urinary Tract: No obstructing stone or hydronephrosis. No
nonobstructing renal calculi noted. Taking into account limitation
by non contrast imaging, no worrisome renal, adrenal or urinary
bladder mass. Urinary bladder evaluation limited by under distension
lack of contrast filled views. Circumferential wall thickening.

Stomach/Bowel: Post gastric surgery. Radiopaque material within the
cecum extending into the base of the appendix limiting evaluation of
surrounding region. No obvious right lower quadrant inflammatory
process is noted. Overall, no extraluminal bowel inflammation
detected.

Portions of the stomach, small bowel and colon are under distended
limiting evaluation for detection of a mass or stricture.

Vascular/Lymphatic: Mild atherosclerotic changes aorta without
aneurysmal dilation.

Persistent increase number of normal size to slightly enlarged lymph
nodes upper abdomen similar to prior exams possibly reactive in
origin.

Reproductive: Minimally prominent prostate gland with minimal
indentation bladder base.

Other: No free air or bowel containing hernia. Small fat containing
hernia base of umbilicus. Mild subcutaneous stranding anterior to
the lower right rectus muscle new from prior exam and of
indeterminate etiology. Mild cellulitis not excluded.

Musculoskeletal: Degenerative changes lumbar spine. Minimal hip
joint degenerative changes. No worrisome osseous lesion.
IMPRESSION: 1. No obstructing stone or hydronephrosis. No nonobstructing renal
calculi noted.
2. Radiopaque material within the cecum extending into the base of
the appendix limiting evaluation of surrounding region. No obvious
right lower quadrant inflammatory process is noted. Overall, no
extraluminal bowel inflammation detected.
3. Mild subcutaneous stranding anterior to the lower right rectus
muscle new from prior exam and of indeterminate etiology. Mild
cellulitis not excluded.
4. Persistent increase number of normal size to slightly enlarged
lymph nodes upper abdomen similar to prior exams possibly reactive
in origin.
5. Under distended urinary bladder with circumferential wall
thickening (which may be related to the degree of distension).
Slight impression upon the bladder base by prostate gland.
6.  Aortic Atherosclerosis ([id number].W).
7. Post gastric surgery and cholecystectomy.

## 2019-10-27 ENCOUNTER — Other Ambulatory Visit: Payer: Self-pay

## 2019-10-27 ENCOUNTER — Ambulatory Visit (HOSPITAL_COMMUNITY): Payer: 59 | Attending: Internal Medicine

## 2019-10-27 DIAGNOSIS — I472 Ventricular tachycardia: Secondary | ICD-10-CM | POA: Insufficient documentation

## 2019-10-27 DIAGNOSIS — I4729 Other ventricular tachycardia: Secondary | ICD-10-CM

## 2019-11-10 ENCOUNTER — Ambulatory Visit (HOSPITAL_COMMUNITY)
Admission: EM | Admit: 2019-11-10 | Discharge: 2019-11-10 | Disposition: A | Discharge status: Home / Self Care | Payer: 59 | Attending: Emergency Medicine | Admitting: Emergency Medicine

## 2019-11-10 ENCOUNTER — Encounter (HOSPITAL_COMMUNITY): Payer: Self-pay

## 2019-11-10 ENCOUNTER — Other Ambulatory Visit: Payer: Self-pay

## 2019-11-10 DIAGNOSIS — Z20822 Contact with and (suspected) exposure to covid-19: Secondary | ICD-10-CM | POA: Diagnosis not present

## 2019-11-10 DIAGNOSIS — K5090 Crohn's disease, unspecified, without complications: Secondary | ICD-10-CM | POA: Insufficient documentation

## 2019-11-10 DIAGNOSIS — Z87891 Personal history of nicotine dependence: Secondary | ICD-10-CM | POA: Insufficient documentation

## 2019-11-10 DIAGNOSIS — R05 Cough: Secondary | ICD-10-CM | POA: Insufficient documentation

## 2019-11-10 DIAGNOSIS — G4730 Sleep apnea, unspecified: Secondary | ICD-10-CM | POA: Insufficient documentation

## 2019-11-10 DIAGNOSIS — I10 Essential (primary) hypertension: Secondary | ICD-10-CM | POA: Insufficient documentation

## 2019-11-10 DIAGNOSIS — Z79899 Other long term (current) drug therapy: Secondary | ICD-10-CM | POA: Diagnosis not present

## 2019-11-10 DIAGNOSIS — J069 Acute upper respiratory infection, unspecified: Secondary | ICD-10-CM | POA: Diagnosis present

## 2019-11-10 LAB — SARS CORONAVIRUS 2 (TAT 6-24 HRS): SARS Coronavirus 2: NEGATIVE

## 2019-11-10 MED ORDER — HYDROCODONE-HOMATROPINE 5-1.5 MG/5ML PO SYRP: 5.0000 mL | ORAL_SOLUTION | Freq: Every evening | ORAL | 0 refills | Status: DC | PRN

## 2019-11-10 NOTE — Discharge Instructions (Signed)
Covid test pending, monitor my chart for results May continue Mucinex for congestion and saline rinses May use Robitussin/Delsym for cough during the day May use cough syrup provided for nighttime cough-does cause drowsiness, do not drive or work after taking Rest and drink plenty of fluids May try honey and hot tea to help soothe throat and help with cough Tylenol for chest soreness  Please follow-up if any symptoms not improving or worsening, developing increased difficulty breathing or shortness of breath, fevers

## 2019-11-10 NOTE — ED Triage Notes (Signed)
Pt presents with non productive cough and sneezing X 2 days; pt had first covid vaccine 2 weeks ago.

## 2019-11-11 NOTE — ED Provider Notes (Addendum)
[place]    CSN: [id number] Arrival date & time: 11/10/19  0034      History   Chief Complaint Chief Complaint  Patient presents with  . Covid Testing    HPI Craig Guzman. is a 49 y.o. male history of allergic rhinitis, Crohn's disease, hypertension, presenting today for evaluation of cough and congestion.  Patient reports over the past 2 days he has had a dry cough with associated nasal congestion and some sneezing.  He reports he had his first Covid vaccine approximately 2 weeks ago.  Ports he has history of allergies, but wanting to make sure and have Covid testing today.  He denies any fevers.  Has had some vomiting, but relates this is posttussive emesis.  Did not take blood pressure medicine today.  Denies chest pain or shortness of breath.  Reports cough that is keeping him up at night.  Is concerned about which medicines he can take in relation to his blood pressure.  HPI  Past Medical History:  Diagnosis Date  . Allergy    trees/ grasses/ animals/dust/mold  . Biliary dyskinesia   . Colloid thyroid nodule   . Crohn's disease ([place])    Stomach, terminal ileum, cecum  . Crohn's disease ([place])   . Dyspnea    due to nasal /sinus congestion  . Gastric ulcer    antral  . GERD (gastroesophageal reflux disease)   . 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 History of migraine headaches   . HTN (hypertension)   . Kidney stone 09/20/2012  . Migraine   . Obesity   . Pneumonia 02/27/2014   ED  . Postsurgical dumping syndrome 02/14/2019  . Sleep apnea    has cpap- does not wear     Patient Active Problem List   Diagnosis Date Noted  . Abdominal migraine, not intractable ? 03/21/2019  . Postsurgical dumping syndrome 02/14/2019  . S/P functional endoscopic sinus surgery 11/27/2018  . Abdominal pain, epigastric   . Angioedema 04/19/2018  . Intractable vomiting with nausea   . Cigarette nicotine dependence without  complication [id number]  . Shifting sleep-work schedule, affecting sleep 05/15/2016  . Sleep deprivation 05/15/2016  . Sleep related hypoventilation in conditions classified elsewhere 05/15/2016  . Sleep related headaches 05/15/2016  . Morbid obesity ([place]) 09/19/2013  . Diarrhea 07/10/2013  . 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 Hypertension 11/26/2012  . Gastroparesis??? 12/08/2011  . Common migraine without aura 12/08/2011  . Long-term use of immunosuppressant medication 08/08/2011  . 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 GERD (gastroesophageal reflux disease) 04/21/2011    Past Surgical History:  Procedure Laterality Date  . BALLOON DILATION N/A 12/06/2017   Procedure: BALLOON DILATION;  Surgeon: Gatha Mayer, MD;  Location: [facility];  Service: Endoscopy;  Laterality: N/A;  . BIOPSY  06/21/2018   Procedure: BIOPSY;  Surgeon: Gatha Mayer, MD;  Location: [facility];  Service: Endoscopy;;  . CHOLECYSTECTOMY  2011   [place]  . CIRCUMCISION N/A 12/09/2018   Procedure: CIRCUMCISION ADULT;  Surgeon: Lucas Mallow, MD;  Location: [facility];  Service: Urology;  Laterality: N/A;  . COLONOSCOPY  07/26/11   Crohn's colitis, ileitis  . ESOPHAGOGASTRODUODENOSCOPY  05/04/11, 07/26/11   granulomatous gastritis - Crohn's  . ESOPHAGOGASTRODUODENOSCOPY (EGD) WITH PROPOFOL N/A 03/28/2017   Procedure: ESOPHAGOGASTRODUODENOSCOPY (EGD) WITH PROPOFOL  with balloon dilation of duodenum;  Surgeon: [name], MD;  Location:  [facility];  Service: Gastroenterology;  Laterality: N/A;  . ESOPHAGOGASTRODUODENOSCOPY (EGD) WITH PROPOFOL N/A 12/06/2017   Procedure: ESOPHAGOGASTRODUODENOSCOPY (EGD) WITH PROPOFOL;  Surgeon: Gatha Mayer, MD;  Location: [facility];  Service: Endoscopy;  Laterality: N/A;  . ESOPHAGOGASTRODUODENOSCOPY (EGD) WITH PROPOFOL N/A 06/21/2018   Procedure: ESOPHAGOGASTRODUODENOSCOPY (EGD) WITH PROPOFOL;  Surgeon: Gatha Mayer, MD;  Location: [facility];  Service: Endoscopy;  Laterality: N/A;  . ETHMOIDECTOMY N/A 11/27/2018   Procedure: ENDOSCOPIC TOTAL ETHMOIDECTOMY;  Surgeon: Leta Baptist, MD;  Location: [facility];  Service: ENT;  Laterality: N/A;  . FLEXIBLE SIGMOIDOSCOPY    . GASTROJEJUNOSTOMY  01/2018  . MULTIPLE TOOTH EXTRACTIONS  2005  . NASAL SEPTOPLASTY W/ TURBINOPLASTY Bilateral 11/27/2018   Procedure: NASAL SEPTOPLASTY WITH BILATERAL TURBINATE REDUCTION;  Surgeon: Leta Baptist, MD;  Location: [facility];  Service: ENT;  Laterality: Bilateral;  . SHOULDER SURGERY     right  . SINUS ENDO WITH FUSION N/A 11/27/2018   Procedure: SINUS ENDO WITH FUSION;  Surgeon: Leta Baptist, MD;  Location: [place];  Service: ENT;  Laterality: N/A;  . UPPER GASTROINTESTINAL ENDOSCOPY    . VASECTOMY     bilateral w/lysis of penile adhesions       Home Medications    Prior to Admission medications   Medication Sig Start Date End Date Taking? Authorizing Provider  colestipol (COLESTID) 1 g tablet Take 1 tablet (1 g total) by mouth 2 (two) times daily. Patient not taking: Reported on 09/23/2019 07/07/19   Gatha Mayer, MD  cyclobenzaprine (FLEXERIL) 10 MG tablet Take 10 mg by mouth 3 (three) times daily as needed (migraines).     [provider]  doxazosin (CARDURA) 8 MG tablet Take 1 tablet (8 mg total) by mouth at bedtime. 09/10/19   [name], MD  HYDROcodone-homatropine [facility]) 5-1.5 MG/5ML syrup Take 5 mLs by mouth at bedtime as needed for cough. 11/10/19   Ketty Bitton C, PA-C  olmesartan (BENICAR) 20 MG tablet Take 1 tablet (20 mg total) by mouth daily. 09/10/19   [name], MD  ondansetron (ZOFRAN-ODT) 4 MG disintegrating tablet Take 1 tablet (4 mg total) by mouth every 8 (eight) hours as needed for nausea or vomiting. 06/09/19   Gatha Mayer, MD  prochlorperazine (COMPAZINE) 25 MG suppository Place 1 suppository (25 mg total) rectally every 12 (twelve) hours as needed for nausea or vomiting. 08/07/18   Gatha Mayer, MD    rizatriptan (MAXALT-MLT) 10 MG disintegrating tablet Take 1 tablet (10 mg total) by mouth 3 (three) times daily as needed for migraine. 03/21/19   Gatha Mayer, MD  verapamil (CALAN-SR) 180 MG CR tablet Take 2 tablets (360 mg total) by mouth daily. 09/10/19   [name], MD    Family History Family History  Problem Relation Age of Onset  . Colon polyps Father   . Diabetes Father   . Hypertension Father   . Hypertension Mother   . Asthma Mother   . Heart disease Maternal Grandmother   . Colon cancer Neg Hx   . Rectal cancer Neg Hx   . Stomach cancer Neg Hx   . Esophageal cancer Neg Hx     Social History Social History   Tobacco Use  . Smoking status: Former Smoker    Packs/day: 0.75    Years: 21.00    Pack years: 15.75    Types: Cigarettes    Quit date: 12/01/2017    Years since quitting: 1.9  .  Smokeless tobacco: Former Systems developer    Types: Snuff  . Tobacco comment: Counseling sheet given in exam room   Substance Use Topics  . Alcohol use: Yes    Alcohol/week: 0.0 standard drinks    Comment: occ  . Drug use: No     Allergies   Zithromax [azithromycin] and Lisinopril   Review of Systems Review of Systems  Constitutional: Negative for activity change, appetite change, chills, fatigue and fever.  HENT: Positive for congestion, rhinorrhea and sneezing. Negative for ear pain, sinus pressure, sore throat and trouble swallowing.   Eyes: Negative for discharge and redness.  Respiratory: Positive for cough. Negative for chest tightness and shortness of breath.   Cardiovascular: Negative for chest pain.  Gastrointestinal: Negative for abdominal pain, diarrhea, nausea and vomiting.  Musculoskeletal: Negative for myalgias.  Skin: Negative for rash.  Neurological: Negative for dizziness, light-headedness and headaches.     Physical Exam Triage Vital Signs ED Triage Vitals  Enc Vitals Group     BP 11/10/19 1149 (!) 200/134     Pulse Rate 11/10/19 1149 100     Resp  11/10/19 1149 17     Temp 11/10/19 1149 98.8 F (37.1 C)     Temp Source 11/10/19 1149 Oral     SpO2 11/10/19 1149 96 %     Weight --      Height --      Head Circumference --      Peak Flow --      Pain Score 11/10/19 1148 0     Pain Loc --      Pain Edu? --      Excl. in [place]? --    No data found.  Updated Vital Signs BP (!) 170/121 (BP Location: Left Arm)   Pulse 100   Temp 98.8 F (37.1 C) (Oral)   Resp 17   SpO2 96%   Visual Acuity Right Eye Distance:   Left Eye Distance:   Bilateral Distance:    Right Eye Near:   Left Eye Near:    Bilateral Near:     Physical Exam Vitals and nursing note reviewed.  Constitutional:      Appearance: He is well-developed.     Comments: No acute distress  HENT:     Head: Normocephalic and atraumatic.     Ears:     Comments: Bilateral ears without tenderness to palpation of external auricle, tragus and mastoid, EAC's without erythema or swelling, TM's with good bony landmarks and cone of light. Non erythematous.    Nose: Nose normal.     Mouth/Throat:     Comments: Oral mucosa pink and moist, no tonsillar enlargement or exudate. Posterior pharynx patent and nonerythematous, no uvula deviation or swelling. Normal phonation. Eyes:     Conjunctiva/sclera: Conjunctivae normal.  Cardiovascular:     Rate and Rhythm: Normal rate.  Pulmonary:     Effort: Pulmonary effort is normal. No respiratory distress.     Comments: Breathing comfortably at rest, CTABL, no wheezing, rales or other adventitious sounds auscultated Abdominal:     General: There is no distension.  Musculoskeletal:        General: Normal range of motion.     Cervical back: Neck supple.  Skin:    General: Skin is warm and dry.  Neurological:     Mental Status: He is alert and oriented to person, place, and time.      UC Treatments / Results  Labs (all labs ordered are listed,  but only abnormal results are displayed) Labs Reviewed  SARS CORONAVIRUS 2 (TAT 6-24  HRS)    EKG   Radiology No results found.  Procedures Procedures (including critical care time)  Medications Ordered in UC Medications - No data to display  Initial Impression / Assessment and Plan / UC Course  I have reviewed the triage vital signs and the nursing notes.  Pertinent labs & imaging results that were available during my care of the patient were reviewed by me and considered in my medical decision making (see chart for details).    COVID PCR pending. 2 days of URI symptoms, vital signs stable, lungs clear, suspect likely viral etiology versus allergic rhinitis.  Recommending continued symptomatic and supportive care with close monitoring.  Rest and push fluids.  Discussed possible medicines that may minimize effect on blood pressure.  Advised to continue to monitor blood pressure at home and take medicine as prescribed.  Will provide Hycodan to use for nighttime cough.  Reports blood pressure elevation with Tessalon in the past.  Discussed strict return precautions. Patient verbalized understanding and is agreeable with plan.  Final Clinical Impressions(s) / UC Diagnoses   Final diagnoses:  Viral URI with cough     Discharge Instructions     Covid test pending, monitor my chart for results May continue Mucinex for congestion and saline rinses May use Robitussin/Delsym for cough during the day May use cough syrup provided for nighttime cough-does cause drowsiness, do not drive or work after taking Rest and drink plenty of fluids May try honey and hot tea to help soothe throat and help with cough Tylenol for chest soreness  Please follow-up if any symptoms not improving or worsening, developing increased difficulty breathing or shortness of breath, fevers   ED Prescriptions    Medication Sig Dispense Auth. Provider   HYDROcodone-homatropine (HYCODAN) 5-1.5 MG/5ML syrup Take 5 mLs by mouth at bedtime as needed for cough. 75 mL Merrell Borsuk, [place] C, PA-C      PDMP not reviewed this encounter.   Janith Lima, PA-C 11/11/19 0803    Debara Pickett C, PA-C 11/11/19 [phone]

## 2019-11-15 ENCOUNTER — Ambulatory Visit (HOSPITAL_COMMUNITY)
Admission: EM | Admit: 2019-11-15 | Discharge: 2019-11-15 | Disposition: A | Discharge status: Home / Self Care | Payer: 59 | Attending: Family Medicine | Admitting: Family Medicine

## 2019-11-15 ENCOUNTER — Encounter (HOSPITAL_COMMUNITY): Payer: Self-pay | Admitting: *Deleted

## 2019-11-15 ENCOUNTER — Other Ambulatory Visit: Payer: Self-pay

## 2019-11-15 DIAGNOSIS — J209 Acute bronchitis, unspecified: Secondary | ICD-10-CM | POA: Diagnosis not present

## 2019-11-15 DIAGNOSIS — J069 Acute upper respiratory infection, unspecified: Secondary | ICD-10-CM | POA: Diagnosis not present

## 2019-11-15 DIAGNOSIS — H9203 Otalgia, bilateral: Secondary | ICD-10-CM

## 2019-11-15 DIAGNOSIS — R059 Cough, unspecified: Secondary | ICD-10-CM

## 2019-11-15 DIAGNOSIS — R05 Cough: Secondary | ICD-10-CM

## 2019-11-15 MED ORDER — ALBUTEROL SULFATE HFA 108 (90 BASE) MCG/ACT IN AERS
INHALATION_SPRAY | RESPIRATORY_TRACT | Status: AC
  Filled 2019-11-15: qty 6.7

## 2019-11-15 MED ORDER — DEXAMETHASONE SODIUM PHOSPHATE 10 MG/ML IJ SOLN
10.0000 mg | Freq: Once | INTRAMUSCULAR | Status: AC
  Administered 2019-11-15: 10 mg via INTRAMUSCULAR

## 2019-11-15 MED ORDER — HYDROCODONE-HOMATROPINE 5-1.5 MG/5ML PO SYRP: 5.0000 mL | ORAL_SOLUTION | Freq: Four times a day (QID) | ORAL | 0 refills | Status: DC | PRN

## 2019-11-15 MED ORDER — ALBUTEROL SULFATE HFA 108 (90 BASE) MCG/ACT IN AERS
2.0000 | INHALATION_SPRAY | Freq: Once | RESPIRATORY_TRACT | Status: AC
  Administered 2019-11-15: 2 via RESPIRATORY_TRACT

## 2019-11-15 MED ORDER — DEXAMETHASONE SODIUM PHOSPHATE 10 MG/ML IJ SOLN
INTRAMUSCULAR | Status: AC
  Filled 2019-11-15: qty 1

## 2019-11-15 MED ORDER — PREDNISONE 10 MG (21) PO TBPK: ORAL_TABLET | Freq: Every day | ORAL | 0 refills | Status: AC

## 2019-11-15 NOTE — ED Triage Notes (Signed)
Pt reports being in [facility] for URI 5/10; states Rx cough med was helping with cough, but has ran out and is unable to sleep due to cough.  Also c/o bilat ear pain now.  Denies any known fevers.

## 2019-11-15 NOTE — ED Provider Notes (Signed)
[place]   [id number] 11/15/19 Arrival Time: 2585   SUBJECTIVE:  Craig Guzman. is a 49 y.o. male who presents with complaint of nasal congestion, post-nasal drainage, and a persistent cough. Onset abrupt approximately 2 weeks ago. Overall fatigued. SOB: mild. Wheezing: moderate OTC treatment: Has taken Hycodan cough syrup with temporary relief at night. Social History   Tobacco Use  Smoking Status Former Smoker  . Packs/day: 0.75  . Years: 21.00  . Pack years: 15.75  . Types: Cigarettes  . Quit date: 12/01/2017  . Years since quitting: 1.9  Smokeless Tobacco Former Systems developer  . Types: Snuff  Tobacco Comment   Counseling sheet given in exam room     ROS: As per HPI.   OBJECTIVE:  Vitals:   11/15/19 1206  BP: 138/89  Pulse: 100  Resp: 18  Temp: 98.6 F (37 C)  TempSrc: Oral  SpO2: 100%     General appearance: alert; no distress HEENT: nasal congestion; clear runny nose; throat irritation secondary to post-nasal drainage Neck: supple without LAD Lungs:Diffuse wheezing bilaterally Skin: warm and dry Psychological: alert and cooperative; normal mood and affect  Results for orders placed or performed during the hospital encounter of 11/10/19  SARS CORONAVIRUS 2 (TAT 6-24 HRS) Nasopharyngeal Nasopharyngeal Swab   Specimen: Nasopharyngeal Swab  Result Value Ref Range   SARS Coronavirus 2 NEGATIVE NEGATIVE    Labs Reviewed - No data to display  No results found.  Allergies  Allergen Reactions  . Zithromax [Azithromycin] Hypertension  . Lisinopril Swelling    SWELLING REACTION UNSPECIFIED   . Tessalon Perles [Benzonatate]     hypertension    Past Medical History:  Diagnosis Date  . Allergy    trees/ grasses/ animals/dust/mold  . Biliary dyskinesia   . Colloid thyroid nodule   . Crohn's disease ([place])    Stomach, terminal ileum, cecum  . Crohn's disease ([place])   . Dyspnea    due to nasal /sinus congestion  . Gastric ulcer    antral  .  GERD (gastroesophageal reflux disease)   . 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 History of migraine headaches   . HTN (hypertension)   . Kidney stone 09/20/2012  . Migraine   . Obesity   . Pneumonia 02/27/2014   ED  . Postsurgical dumping syndrome 02/14/2019  . Sleep apnea    has cpap- does not wear    Social History   Socioeconomic History  . Marital status: Married    Spouse name: Not on file  . Number of children: 3  . Years of education: Some college  . Highest education level: Not on file  Occupational History  . Occupation: Environmental health practitioner: [employer]  Tobacco Use  . Smoking status: Former Smoker    Packs/day: 0.75    Years: 21.00    Pack years: 15.75    Types: Cigarettes    Quit date: 12/01/2017    Years since quitting: 1.9  . Smokeless tobacco: Former Systems developer    Types: Snuff  . Tobacco comment: Counseling sheet given in exam room   Substance and Sexual Activity  . Alcohol use: Not Currently  . Drug use: No  . Sexual activity: Not on file  Other Topics Concern  . Not on file  Social History Narrative   Married. Education: [employer].    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 Difficulty of Paying Living Expenses:   Food Insecurity:   . Worried About Charity fundraiser in the Last Year:   . Arboriculturist in the Last Year:   Transportation Needs:   . Film/video editor (Medical):   Marland Kitchen Lack of Transportation (Non-Medical):   Physical Activity:   . Days of Exercise per Week:   . Minutes of Exercise per Session:   Stress:   . Feeling of Stress :   Social Connections:   . Frequency of Communication with Friends and Family:   . Frequency of Social Gatherings  with Friends and Family:   . Attends Religious Services:   . Active Member of Clubs or Organizations:   . Attends Archivist Meetings:   Marland Kitchen Marital Status:   Intimate Partner Violence:   . Fear of Current or Ex-Partner:   . Emotionally Abused:   Marland Kitchen Physically Abused:   . Sexually Abused:    Family History  Problem Relation Age of Onset  . Colon polyps Father   . Diabetes Father   . Hypertension Father   . Hypertension Mother   . Asthma Mother   . Heart disease Maternal Grandmother   . Colon cancer Neg Hx   . Rectal cancer Neg Hx   . Stomach cancer Neg Hx   . Esophageal cancer Neg Hx      ASSESSMENT & PLAN:  1. Cough   2. Ear pain, bilateral   3. Acute bronchitis, unspecified organism   4. Upper respiratory tract infection, unspecified type     Meds ordered this encounter  Medications  . dexamethasone (DECADRON) injection 10 mg  . albuterol (VENTOLIN HFA) 108 (90 Base) MCG/ACT inhaler 2 puff  . predniSONE (STERAPRED UNI-PAK 21 TAB) 10 MG (21) TBPK tablet    Sig: Take by mouth daily for 6 days. Take 6 tablets on day 1, 5 tablets on day 2, 4 tablets on day 3, 3 tablets on day 4, 2 tablets on day 5, 1 tablet on day 6    Dispense:  21 tablet    Refill:  0    Order Specific Question:   Supervising Provider    Answer:   Craig Guzman [id number]  . HYDROcodone-homatropine (HYCODAN) 5-1.5 MG/5ML syrup    Sig: Take 5 mLs by mouth every 6 (six) hours as needed for cough.    Dispense:  120 mL    Refill:  0    Order Specific Question:   Supervising Provider    Answer:   Craig Guzman [id number]    Will treat with above therapy given duration of symptoms. Cough medication sedation precautions.  OTC symptom care as needed. Will plan f/u with PCP or here as needed.  Reviewed expectations re: course of current medical issues. Questions answered. Outlined signs and symptoms indicating need for more acute intervention. Patient verbalized understanding. After  Visit Summary given.            Craig Congress, NP 11/15/19 1240

## 2019-11-15 NOTE — Discharge Instructions (Addendum)
I have also sent in a prednisone taper for you to take for the next 6 days.   I have refilled the Hycodan syrup as well.   Follow up if you are not improving over the next 2 days with primary care or with this office.

## 2019-12-16 ENCOUNTER — Ambulatory Visit (INDEPENDENT_AMBULATORY_CARE_PROVIDER_SITE_OTHER): Payer: 59 | Admitting: Internal Medicine

## 2019-12-16 ENCOUNTER — Encounter: Payer: Self-pay | Admitting: Internal Medicine

## 2019-12-16 VITALS — BP 112/84 | HR 90 | Ht 72.0 in | Wt 353.4 lb

## 2019-12-16 DIAGNOSIS — R1031 Right lower quadrant pain: Secondary | ICD-10-CM

## 2019-12-16 DIAGNOSIS — K5000 Crohn's disease of small intestine without complications: Secondary | ICD-10-CM

## 2019-12-16 DIAGNOSIS — R197 Diarrhea, unspecified: Secondary | ICD-10-CM | POA: Diagnosis not present

## 2019-12-16 DIAGNOSIS — Z6841 Body Mass Index (BMI) 40.0 and over, adult: Secondary | ICD-10-CM

## 2019-12-16 DIAGNOSIS — G8929 Other chronic pain: Secondary | ICD-10-CM

## 2019-12-16 DIAGNOSIS — R1032 Left lower quadrant pain: Secondary | ICD-10-CM

## 2019-12-16 DIAGNOSIS — R1011 Right upper quadrant pain: Secondary | ICD-10-CM | POA: Diagnosis not present

## 2019-12-16 MED ORDER — DICYCLOMINE HCL 20 MG PO TABS: 20.0000 mg | ORAL_TABLET | Freq: Three times a day (TID) | ORAL | 2 refills | Status: DC

## 2019-12-16 NOTE — Patient Instructions (Addendum)
We have sent the following medications to your pharmacy for you to pick up at your convenience: Dicyclomine , if this doesn't help call us back.   Therapist, nutritional.com and look at the low carb information and intermittent fasting.   I appreciate the opportunity to care for you. Silvano Rusk, MD, [facility]

## 2019-12-16 NOTE — Progress Notes (Signed)
Craig Guzman. 49 y.o. [birth year] [id number]  Assessment & Plan:   Encounter Diagnoses  Name Primary?  . 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 Bilateral lower abdominal pain   . Diarrhea - episodic IBS, dumping, IBD?   Marland Kitchen Chronic RUQ pain   . Class 3 severe obesity due to excess calories with serious comorbidity and body mass index (BMI) of 45.0 to 49.9 in adult [facility])     IBS versus IBD versus dumping syndrome as far as the symptoms are concerned.  Trial of dicyclomine 20 mg before meals.  This right upper quadrant pain seems possibly neuropathic to me.  Prior work-up has been unrevealing.  We had a discussion about weight and diet and it could be more difficult in him particularly with these food sensitivities he describes, but I think trying a low-carb diet and intermittent fasting could be helpful to him in many ways.  Internet link for diet doctor provided the fasting method as well and I emailed him a video about intermittent fasting.  Regarding his symptoms, if the dicyclomine fails to help I would probably do a stool calprotectin test next.  Also bear in mind that he is on olmesartan though he has not had problems before that can lead to a celiac sprue-like issue  I appreciate the opportunity to care for this patient. CC: [name], MD   Subjective:   Chief Complaint: Abdominal pain and diarrhea  HPI Craig Guzman is a 49 year old African-American man with history of gastroduodenal Crohn's disease status post distal gastrectomy duodenectomy, here with complaints of crampy bilateral lower quadrant abdominal pain and diarrhea.  He continues to have a right sided upper quadrant/flank burning and warm or hot pain that comes and goes.  However since last seen in March he has developed problems with bilateral lower quadrant cramps with somewhat urgent defecation and loose stools or diarrhea.  He has formed stools at times as well.  Heavier food such  as a [place] sandwich other meats some fruits and generally solid foods give more of a problem.  Soups chips cookies not so much of an issue.  He has been grilling food and trying to eat better and avoiding fried foods.  He continues to notice that if he leans to the left or lies on his side after eating (left side) he will not have problems with regurgitation or vomiting.  Last colonoscopy was in 2019 and did not show any evidence of active disease, his EGD was then as well, and though endoscopically normal postoperative exam he did have some granulomas at the gastrojejunal anastomosis.  Wt Readings from Last 3 Encounters:  12/16/19 (!) 353 lb 6 oz (160.3 kg)  09/23/19 (!) 357 lb (161.9 kg)  09/10/19 (!) 358 lb (162.4 kg)   T[place]aun remains in vocational rehab and will go back to school in August Allergies  Allergen Reactions  . Zithromax [Azithromycin] Hypertension  . Lisinopril Swelling    SWELLING REACTION UNSPECIFIED   . Tessalon Perles [Benzonatate]     hypertension   Current Meds  Medication Sig  . cyclobenzaprine (FLEXERIL) 10 MG tablet Take 10 mg by mouth 3 (three) times daily as needed (migraines).   Marland Kitchen doxazosin (CARDURA) 8 MG tablet Take 1 tablet (8 mg total) by mouth at bedtime.  Marland Kitchen olmesartan (BENICAR) 20 MG tablet Take 1 tablet (20 mg total) by mouth daily.  . ondansetron (ZOFRAN-ODT) 4 MG disintegrating tablet Take 1 tablet (4 mg total) by mouth every  8 (eight) hours as needed for nausea or vomiting.  . prochlorperazine (COMPAZINE) 25 MG suppository Place 1 suppository (25 mg total) rectally every 12 (twelve) hours as needed for nausea or vomiting.  . rizatriptan (MAXALT-MLT) 10 MG disintegrating tablet Take 1 tablet (10 mg total) by mouth 3 (three) times daily as needed for migraine.  . verapamil (CALAN-SR) 180 MG CR tablet Take 2 tablets (360 mg total) by mouth daily.  . [DISCONTINUED] colestipol (COLESTID) 1 g tablet Take 1 tablet (1 g total) by mouth 2 (two) times daily.    Past Medical History:  Diagnosis Date  . Allergy    trees/ grasses/ animals/dust/mold  . Biliary dyskinesia   . Colloid thyroid nodule   . Crohn's disease ([place])    Stomach, terminal ileum, cecum  . Crohn's disease ([place])   . Dyspnea    due to nasal /sinus congestion  . Gastric ulcer    antral  . GERD (gastroesophageal reflux disease)   . 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 History of migraine headaches   . HTN (hypertension)   . Kidney stone 09/20/2012  . Migraine   . Obesity   . Pneumonia 02/27/2014   ED  . Postsurgical dumping syndrome 02/14/2019  . Sleep apnea    has cpap- does not wear    Past Surgical History:  Procedure Laterality Date  . BALLOON DILATION N/A 12/06/2017   Procedure: BALLOON DILATION;  Surgeon: Gatha Mayer, MD;  Location[facility];  Service: Endoscopy;  Laterality: N/A;  . BIOPSY  06/21/2018   Procedure: BIOPSY;  Surgeon: Gatha Mayer, MD;  [facility];  Service: Endoscopy;;  . CHOLECYSTECTOMY  2011   [place]  . CIRCUMCISION N/A 12/09/2018   Procedure: CIRCUMCISION ADULT;  Surgeon: Lucas Mallow, MD;  [facility];  Service: Urology;  Laterality: N/A;  . COLONOSCOPY  07/26/11   Crohn's colitis, ileitis  . ESOPHAGOGASTRODUODENOSCOPY  05/04/11, 07/26/11   granulomatous gastritis - Crohn's  . ESOPHAGOGASTRODUODENOSCOPY (EGD) WITH PROPOFOL N/A 03/28/2017   Procedure: ESOPHAGOGASTRODUODENOSCOPY (EGD) WITH PROPOFOL  with balloon dilation of duodenum;  Surgeon: [name], MD;  [facility];  Service: Gastroenterology;  Laterality: N/A;  . ESOPHAGOGASTRODUODENOSCOPY (EGD) WITH PROPOFOL N/A 12/06/2017   Procedure: ESOPHAGOGASTRODUODENOSCOPY (EGD) WITH PROPOFOL;  Surgeon: Gatha Mayer, MD;  [facility];  Service: Endoscopy;  Laterality: N/A;  . ESOPHAGOGASTRODUODENOSCOPY (EGD) WITH PROPOFOL N/A 06/21/2018   Procedure: ESOPHAGOGASTRODUODENOSCOPY (EGD)  WITH PROPOFOL;  Surgeon: Gatha Mayer, MD;  [facility];  Service: Endoscopy;  Laterality: N/A;  . ETHMOIDECTOMY N/A 11/27/2018   Procedure: ENDOSCOPIC TOTAL ETHMOIDECTOMY;  Surgeon: Leta Baptist, MD;  Location: [place];  Service: ENT;  Laterality: N/A;  . FLEXIBLE SIGMOIDOSCOPY    . GASTROJEJUNOSTOMY  01/2018  . MULTIPLE TOOTH EXTRACTIONS  2005  . NASAL SEPTOPLASTY W/ TURBINOPLASTY Bilateral 11/27/2018   Procedure: NASAL SEPTOPLASTY WITH BILATERAL TURBINATE REDUCTION;  Surgeon: Leta Baptist, MD;  Location: [place];  Service: ENT;  Laterality: Bilateral;  . SHOULDER SURGERY     right  . SINUS ENDO WITH FUSION N/A 11/27/2018   Procedure: SINUS ENDO WITH FUSION;  Surgeon: Leta Baptist, MD;  Location: [place];  Service: ENT;  Laterality: N/A;  . UPPER GASTROINTESTINAL ENDOSCOPY    . VASECTOMY     bilateral w/lysis of penile adhesions   Social History   Social History Narrative   Married. Education: [employer].  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   Review of Systems As per HPI  Objective:   Physical Exam BP 112/84   Pulse 90   Ht 6' (1.829 m)   Wt (!) 353 lb 6 oz (160.3 kg)   BMI 47.93 kg/m  Obese Mild focal lateral RUQ tenderness and otherwise neg

## 2020-01-16 ENCOUNTER — Other Ambulatory Visit: Payer: Self-pay | Admitting: Internal Medicine

## 2020-01-16 DIAGNOSIS — K50018 Crohn's disease of small intestine with other complication: Secondary | ICD-10-CM

## 2020-01-16 DIAGNOSIS — R197 Diarrhea, unspecified: Secondary | ICD-10-CM

## 2020-01-21 ENCOUNTER — Other Ambulatory Visit: Payer: 59

## 2020-01-21 DIAGNOSIS — R197 Diarrhea, unspecified: Secondary | ICD-10-CM

## 2020-01-21 DIAGNOSIS — K50018 Crohn's disease of small intestine with other complication: Secondary | ICD-10-CM

## 2020-01-23 LAB — CALPROTECTIN, FECAL: Calprotectin, Fecal: 70 ug/g (ref 0–120)

## 2020-01-25 ENCOUNTER — Other Ambulatory Visit: Payer: Self-pay | Admitting: Internal Medicine

## 2020-03-05 MED ORDER — HYOSCYAMINE SULFATE 0.125 MG PO TBDP: 0.1250 mg | ORAL_TABLET | ORAL | 1 refills | Status: DC | PRN

## 2020-03-05 MED ORDER — FAMOTIDINE 40 MG PO TABS
40.0000 mg | ORAL_TABLET | Freq: Every day | ORAL | 1 refills | Status: DC
Start: 2020-03-05 — End: 2022-03-31

## 2020-03-05 NOTE — Telephone Encounter (Signed)
Called Will try famotidine qhs and hyoscyamine 0.125 mg prn

## 2020-03-07 ENCOUNTER — Ambulatory Visit (HOSPITAL_COMMUNITY)
Admission: EM | Admit: 2020-03-07 | Discharge: 2020-03-07 | Disposition: A | Discharge status: Home / Self Care | Payer: 59 | Attending: Internal Medicine | Admitting: Internal Medicine

## 2020-03-07 ENCOUNTER — Other Ambulatory Visit: Payer: Self-pay

## 2020-03-07 DIAGNOSIS — Z20822 Contact with and (suspected) exposure to covid-19: Secondary | ICD-10-CM | POA: Insufficient documentation

## 2020-03-07 DIAGNOSIS — Z1152 Encounter for screening for COVID-19: Secondary | ICD-10-CM | POA: Diagnosis not present

## 2020-03-07 LAB — SARS CORONAVIRUS 2 (TAT 6-24 HRS): SARS Coronavirus 2: NEGATIVE

## 2020-03-07 NOTE — Discharge Instructions (Signed)
If your Covid-19 test is positive, you will get a phone call from [place] regarding your results. If your Covid-19 test is negative, you will NOT get a phone call from [facility] with your results. You may view your results on MyChart. If you do not have a MyChart account, sign up instructions are in your discharge papers. ° °

## 2020-03-07 NOTE — ED Triage Notes (Signed)
Pt presents for covid testing with no known symptoms.

## 2020-03-10 ENCOUNTER — Other Ambulatory Visit: Payer: Self-pay

## 2020-03-10 ENCOUNTER — Ambulatory Visit: Payer: 59 | Admitting: Family Medicine

## 2020-03-10 ENCOUNTER — Encounter: Payer: Self-pay | Admitting: Family Medicine

## 2020-03-10 ENCOUNTER — Ambulatory Visit: Payer: 59 | Admitting: Emergency Medicine

## 2020-03-10 VITALS — BP 141/92 | HR 100 | Temp 98.3°F | Ht 72.0 in | Wt 347.0 lb

## 2020-03-10 DIAGNOSIS — R351 Nocturia: Secondary | ICD-10-CM | POA: Diagnosis not present

## 2020-03-10 DIAGNOSIS — I10 Essential (primary) hypertension: Secondary | ICD-10-CM

## 2020-03-10 DIAGNOSIS — R739 Hyperglycemia, unspecified: Secondary | ICD-10-CM

## 2020-03-10 DIAGNOSIS — Z23 Encounter for immunization: Secondary | ICD-10-CM | POA: Diagnosis not present

## 2020-03-10 MED ORDER — VERAPAMIL HCL ER 180 MG PO TBCR: 360.0000 mg | EXTENDED_RELEASE_TABLET | Freq: Every day | ORAL | 1 refills | Status: DC

## 2020-03-10 MED ORDER — OLMESARTAN MEDOXOMIL 20 MG PO TABS: 20.0000 mg | ORAL_TABLET | Freq: Every day | ORAL | 1 refills | Status: DC

## 2020-03-10 MED ORDER — DOXAZOSIN MESYLATE 8 MG PO TABS: 8.0000 mg | ORAL_TABLET | Freq: Every day | ORAL | 1 refills | Status: DC

## 2020-03-10 NOTE — Patient Instructions (Addendum)
   Depending on readings today, I would consider a low dose cholesterol med.  Make sure to avoid salty foods or added salt to foods if possible for blood pressure.  Weight is better, I expect blood sugar will also be better.    If you have lab work done today you will be contacted with your lab results within the next 2 weeks.  If you have not heard from [place] then please contact us. The fastest way to get your results is to register for My Chart.   IF you received an x-ray today, you will receive an invoice from [facility]. Please contact [facility] at [phone] with questions or concerns regarding your invoice.   IF you received labwork today, you will receive an invoice from [place]. Please contact [facility] at [phone] with questions or concerns regarding your invoice.   Our billing staff will not be able to assist you with questions regarding bills from these companies.  You will be contacted with the lab results as soon as they are available. The fastest way to get your results is to activate your My Chart account. Instructions are located on the last page of this paperwork. If you have not heard from [place] regarding the results in 2 weeks, please contact this office.

## 2020-03-10 NOTE — Progress Notes (Signed)
Subjective:  Patient ID: Craig Clark., male    DOB: 12-02-1970  Age: 49 y.o. MRN: [id number]  CC:  Chief Complaint  Patient presents with  . Follow-up    on hypertesnsion,hyperlipidemia, and hyperglycemia. PT reports no issues with his BP since last OV nor any issues with the other chronic conditions since last OV. Pt reports no physical symptoms of these conditions. Pt isn't fasting.    HPI Craig Guzman. presents for   Hypertension: Doxazosin 20m qhs, verapamil 3643mCR qd, olmesartan 2063md.  Not adding salt.  Fish and vegetables. Some restaurant food/take out - 2 times per week, no fast food. Mostly vegetables. Avoiding pizza.   Home readings:130/80's usually.  Up today after talking to social security. Reading on 9/5 off meds that day. Forgot to take meds that day.  Usually takes meds daily.  BP Readings from Last 3 Encounters:  03/10/20 (!) 141/92  03/07/20 (!) 166/105  12/16/19 112/84   Lab Results  Component Value Date   CREATININE 0.99 03/10/2020    Hyperlipidemia: The 10-year ASCVD risk score (GoMikey Bussing Jr., et al., 2013) is: 10.7%   Values used to calculate the score:     Age: [age]     Sex: Male     Is Non-Hispanic African American: Yes     Diabetic: No     Tobacco smoker: No     Systolic Blood Pressure: 141347Hg     Is BP treated: Yes     HDL Cholesterol: 31 mg/dL     Total Cholesterol: 186 mg/dL  No current meds.  Lab Results  Component Value Date   CHOL 186 03/10/2020   HDL 31 (L) 03/10/2020   LDLCALC 128 (H) 03/10/2020   TRIG 152 (H) 03/10/2020   CHOLHDL 6.0 (H) 03/10/2020   Lab Results  Component Value Date   ALT 62 (H) 03/10/2020   AST 84 (H) 03/10/2020   ALKPHOS 136 (H) 03/10/2020   BILITOT 0.4 03/10/2020    prediabetes: Weight down 10 pounds since March.  Lab Results  Component Value Date   HGBA1C 5.8 (H) 03/10/2020   HGBA1C 5.7 (H) 09/10/2019   Lab Results  Component Value Date   LDLCALC 128 (H) 03/10/2020    CREATININE 0.99 03/10/2020   Wt Readings from Last 3 Encounters:  03/10/20 (!) 347 lb (157.4 kg)  12/16/19 (!) 353 lb 6 oz (160.3 kg)  09/23/19 (!) 357 lb (161.9 kg)    Followed by Dr. GesCarlean Purlr Crohn's disease. Now on famotidine at bedtime and hyoscamine in am.     History Patient Active Problem List   Diagnosis Date Noted  . Abdominal migraine, not intractable ? 03/21/2019  . Postsurgical dumping syndrome 02/14/2019  . S/P functional endoscopic sinus surgery 11/27/2018  . Abdominal pain, epigastric   . Angioedema 04/19/2018  . Intractable vomiting with nausea   . Cigarette nicotine dependence without complication 11/42/59/5638 Shifting sleep-work schedule, affecting sleep 05/15/2016  . Sleep deprivation 05/15/2016  . Sleep related hypoventilation in conditions classified elsewhere 05/15/2016  . Sleep related headaches 05/15/2016  . Morbid obesity ([place]/20/2015  . Diarrhea 07/10/2013  . 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 Hypertension 11/26/2012  . Gastroparesis??? 12/08/2011  . Common migraine without aura 12/08/2011  . Long-term use of immunosuppressant medication 08/08/2011  . Gastroduodenal Crohn's disease ([place]1/14/2012  . GERD (gastroesophageal reflux disease) 04/21/2011   Past Medical History:  Diagnosis Date  . Allergy    trees/  grasses/ animals/dust/mold  . Biliary dyskinesia   . Colloid thyroid nodule   . Crohn's disease ([place])    Stomach, terminal ileum, cecum  . Crohn's disease ([place])   . Dyspnea    due to nasal /sinus congestion  . Gastric ulcer    antral  . GERD (gastroesophageal reflux disease)   . 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 History of migraine headaches   . HTN (hypertension)   . Kidney stone 09/20/2012  . Migraine   . Obesity   . Pneumonia 02/27/2014   ED  . Postsurgical dumping syndrome 02/14/2019  . Sleep apnea    has cpap- does not wear    Past Surgical History:  Procedure  Laterality Date  . BALLOON DILATION N/A 12/06/2017   Procedure: BALLOON DILATION;  Surgeon: Gatha Mayer, MD;  Location: [facility];  Service: Endoscopy;  Laterality: N/A;  . BIOPSY  06/21/2018   Procedure: BIOPSY;  Surgeon: Gatha Mayer, MD;  [facility];  Service: Endoscopy;;  . CHOLECYSTECTOMY  2011   [place]  . CIRCUMCISION N/A 12/09/2018   Procedure: CIRCUMCISION ADULT;  Surgeon: Lucas Mallow, MD;  [facility];  Service: Urology;  Laterality: N/A;  . COLONOSCOPY  07/26/11   Crohn's colitis, ileitis  . ESOPHAGOGASTRODUODENOSCOPY  05/04/11, 07/26/11   granulomatous gastritis - Crohn's  . ESOPHAGOGASTRODUODENOSCOPY (EGD) WITH PROPOFOL N/A 03/28/2017   Procedure: ESOPHAGOGASTRODUODENOSCOPY (EGD) WITH PROPOFOL  with balloon dilation of duodenum;  Surgeon: [name], MD;  [facility];  Service: Gastroenterology;  Laterality: N/A;  . ESOPHAGOGASTRODUODENOSCOPY (EGD) WITH PROPOFOL N/A 12/06/2017   Procedure: ESOPHAGOGASTRODUODENOSCOPY (EGD) WITH PROPOFOL;  Surgeon: Gatha Mayer, MD;  [facility];  Service: Endoscopy;  Laterality: N/A;  . ESOPHAGOGASTRODUODENOSCOPY (EGD) WITH PROPOFOL N/A 06/21/2018   Procedure: ESOPHAGOGASTRODUODENOSCOPY (EGD) WITH PROPOFOL;  Surgeon: Gatha Mayer, MD;  [facility];  Service: Endoscopy;  Laterality: N/A;  . ETHMOIDECTOMY N/A 11/27/2018   Procedure: ENDOSCOPIC TOTAL ETHMOIDECTOMY;  Surgeon: Leta Baptist, MD;  Location: [place];  Service: ENT;  Laterality: N/A;  . FLEXIBLE SIGMOIDOSCOPY    . GASTROJEJUNOSTOMY  01/2018  . MULTIPLE TOOTH EXTRACTIONS  2005  . NASAL SEPTOPLASTY W/ TURBINOPLASTY Bilateral 11/27/2018   Procedure: NASAL SEPTOPLASTY WITH BILATERAL TURBINATE REDUCTION;  Surgeon: Leta Baptist, MD;  Location: [place];  Service: ENT;  Laterality: Bilateral;  . SHOULDER SURGERY     right  . SINUS ENDO WITH FUSION N/A 11/27/2018   Procedure: SINUS ENDO WITH FUSION;  Surgeon: Leta Baptist, MD;  Location:  [place];  Service: ENT;  Laterality: N/A;  . UPPER GASTROINTESTINAL ENDOSCOPY    . VASECTOMY     bilateral w/lysis of penile adhesions   Allergies  Allergen Reactions  . Zithromax [Azithromycin] Hypertension  . Lisinopril Swelling    SWELLING REACTION UNSPECIFIED   . Tessalon Perles [Benzonatate]     hypertension   Prior to Admission medications   Medication Sig Start Date End Date Taking? Authorizing Provider  cyclobenzaprine (FLEXERIL) 10 MG tablet Take 10 mg by mouth 3 (three) times daily as needed (migraines).    Yes [provider]  doxazosin (CARDURA) 8 MG tablet Take 1 tablet (8 mg total) by mouth at bedtime. 09/10/19  Yes [name], MD  famotidine (PEPCID) 40 MG tablet Take 1 tablet (40 mg total) by mouth daily. 03/05/20  Yes Gatha Mayer, MD  hyoscyamine (ANASPAZ) 0.125  MG TBDP disintergrating tablet Place 1 tablet (0.125 mg total) under the tongue every 4 (four) hours as needed for cramping. 03/05/20  Yes Gatha Mayer, MD  olmesartan (BENICAR) 20 MG tablet Take 1 tablet (20 mg total) by mouth daily. 09/10/19  Yes [name], MD  ondansetron (ZOFRAN-ODT) 4 MG disintegrating tablet Take 1 tablet (4 mg total) by mouth every 8 (eight) hours as needed for nausea or vomiting. 06/09/19  Yes Gatha Mayer, MD  prochlorperazine (COMPAZINE) 25 MG suppository Place 1 suppository (25 mg total) rectally every 12 (twelve) hours as needed for nausea or vomiting. 08/07/18  Yes Gatha Mayer, MD  rizatriptan (MAXALT-MLT) 10 MG disintegrating tablet TAKE 1 TABLET 3 TIME A DA Y AS NEEDED FOR MIGRAINE 01/26/20  Yes Gatha Mayer, MD  verapamil (CALAN-SR) 180 MG CR tablet Take 2 tablets (360 mg total) by mouth daily. 09/10/19  Yes [name], MD   Social History   Socioeconomic History  . Marital status: Married    Spouse name: Not on file  . Number of children: 3  . Years of education: Some college  . Highest education level: Not on file  Occupational History  .  Occupation: Environmental health practitioner: [employer]  Tobacco Use  . Smoking status: Former Smoker    Packs/day: 0.75    Years: 21.00    Pack years: 15.75    Types: Cigarettes    Quit date: 12/01/2017    Years since quitting: 2.2  . Smokeless tobacco: Former Systems developer    Types: Snuff  . Tobacco comment: Counseling sheet given in exam room   Vaping Use  . Vaping Use: Never used  Substance and Sexual Activity  . Alcohol use: Not Currently  . Drug use: No  . Sexual activity: Not on file  Other Topics Concern  . Not on file  Social History Narrative   Married. Education: [employer].    Lives at home w/ his wife and Engineer, site for [employer] -on disability vocational rehab pursuing different career   Right-handed   Caffeine: 3 sodas per day   Occasional alcohol no drugs   Former smoker former snuff user   Social Determinants of Radio broadcast assistant Strain:   . Difficulty of Paying Living Expenses: Not on file  Food Insecurity:   . Worried About Charity fundraiser in the Last Year: Not on file  . Ran Out of Food in the Last Year: Not on file  Transportation Needs:   . Lack of Transportation (Medical): Not on file  . Lack of Transportation (Non-Medical): Not on file  Physical Activity:   . Days of Exercise per Week: Not on file  . Minutes of Exercise per Session: Not on file  Stress:   . Feeling of Stress : Not on file  Social Connections:   . Frequency of Communication with Friends and Family: Not on file  . Frequency of Social Gatherings with Friends and Family: Not on file  . Attends Religious Services: Not on file  . Active Member of Clubs or Organizations: Not on file  . Attends Archivist Meetings: Not on file  . Marital Status: Not on file  Intimate Partner Violence:   . Fear of Current or Ex-Partner: Not on file  . Emotionally Abused: Not on file  . Physically Abused: Not on file  . Sexually Abused: Not on file    Review of Systems  Constitutional:  Negative  for fatigue and unexpected weight change.  Eyes: Negative for visual disturbance.  Respiratory: Negative for cough, chest tightness and shortness of breath.   Cardiovascular: Negative for chest pain, palpitations and leg swelling.  Gastrointestinal: Positive for abdominal pain (chronic abdominal symptoms).  Neurological: Negative for dizziness, light-headedness and headaches.     Objective:   Vitals:   03/10/20 1552  BP: (!) 141/92  Pulse: 100  Temp: 98.3 F (36.8 C)  TempSrc: Temporal  SpO2: 99%  Weight: (!) 347 lb (157.4 kg)  Height: 6' (1.829 m)     Physical Exam Vitals reviewed.  Constitutional:      Appearance: He is well-developed. He is obese.  HENT:     Head: Normocephalic and atraumatic.  Eyes:     Pupils: Pupils are equal, round, and reactive to light.  Neck:     Vascular: No carotid bruit or JVD.  Cardiovascular:     Rate and Rhythm: Normal rate and regular rhythm.     Heart sounds: Normal heart sounds. No murmur heard.   Pulmonary:     Effort: Pulmonary effort is normal.     Breath sounds: Normal breath sounds. No rales.  Skin:    General: Skin is warm and dry.  Neurological:     Mental Status: He is alert and oriented to person, place, and time.        Assessment & Plan:  Craig Doo. is a 49 y.o. male . Essential hypertension - Plan: Comprehensive metabolic panel, Lipid panel, olmesartan (BENICAR) 20 MG tablet, verapamil (CALAN-SR) 180 MG CR tablet, doxazosin (CARDURA) 8 MG tablet  -Improved control on home readings.  Check labs, continue same regimen, salt avoidance discussed with handout given on the salty 6.  -Check lipids.  Consider statin based on ASCVD risk scoring  Need for prophylactic vaccination and inoculation against influenza - Plan: Flu Vaccine QUAD 36+ mos IM  Hyperglycemia - Plan: Hemoglobin A1c  Check A1c.  Commended on decreased weight.  Nocturia - Plan: doxazosin (CARDURA) 8 MG tablet  -Stable, continue  doxazosin.  Meds ordered this encounter  Medications  . olmesartan (BENICAR) 20 MG tablet    Sig: Take 1 tablet (20 mg total) by mouth daily.    Dispense:  90 tablet    Refill:  1  . verapamil (CALAN-SR) 180 MG CR tablet    Sig: Take 2 tablets (360 mg total) by mouth daily.    Dispense:  180 tablet    Refill:  1  . doxazosin (CARDURA) 8 MG tablet    Sig: Take 1 tablet (8 mg total) by mouth at bedtime.    Dispense:  90 tablet    Refill:  1   Patient Instructions     Depending on readings today, I would consider a low dose cholesterol med.  Make sure to avoid salty foods or added salt to foods if possible for blood pressure.  Weight is better, I expect blood sugar will also be better.    If you have lab work done today you will be contacted with your lab results within the next 2 weeks.  If you have not heard from [place] then please contact us. The fastest way to get your results is to register for My Chart.   IF you received an x-ray today, you will receive an invoice from [facility]. Please contact [facility] at [phone] with questions or concerns regarding your invoice.   IF you received labwork today, you will receive an invoice  from [facility]. Please contact [facility] at [phone] with questions or concerns regarding your invoice.   Our billing staff will not be able to assist you with questions regarding bills from these companies.  You will be contacted with the lab results as soon as they are available. The fastest way to get your results is to activate your My Chart account. Instructions are located on the last page of this paperwork. If you have not heard from [place] regarding the results in 2 weeks, please contact this office.         Signed, Merri Ray, MD [facility]

## 2020-03-11 ENCOUNTER — Encounter: Payer: Self-pay | Admitting: Family Medicine

## 2020-03-11 LAB — COMPREHENSIVE METABOLIC PANEL
ALT: 62 IU/L — ABNORMAL HIGH (ref 0–44)
AST: 84 IU/L — ABNORMAL HIGH (ref 0–40)
Albumin/Globulin Ratio: 1.1 — ABNORMAL LOW (ref 1.2–2.2)
Albumin: 3.8 g/dL — ABNORMAL LOW (ref 4.0–5.0)
Alkaline Phosphatase: 136 IU/L — ABNORMAL HIGH (ref 48–121)
BUN/Creatinine Ratio: 7 — ABNORMAL LOW (ref 9–20)
BUN: 7 mg/dL (ref 6–24)
Bilirubin Total: 0.4 mg/dL (ref 0.0–1.2)
CO2: 22 mmol/L (ref 20–29)
Calcium: 8.7 mg/dL (ref 8.7–10.2)
Chloride: 105 mmol/L (ref 96–106)
Creatinine, Ser: 0.99 mg/dL (ref 0.76–1.27)
GFR calc Af Amer: 104 mL/min/{1.73_m2} (ref >59)
GFR calc non Af Amer: 90 mL/min/{1.73_m2} (ref >59)
Globulin, Total: 3.5 g/dL (ref 1.5–4.5)
Glucose: 89 mg/dL (ref 65–99)
Potassium: 4 mmol/L (ref 3.5–5.2)
Sodium: 141 mmol/L (ref 134–144)
Total Protein: 7.3 g/dL (ref 6.0–8.5)

## 2020-03-11 LAB — LIPID PANEL
Chol/HDL Ratio: 6 ratio — ABNORMAL HIGH (ref 0.0–5.0)
Cholesterol, Total: 186 mg/dL (ref 100–199)
HDL: 31 mg/dL — ABNORMAL LOW (ref >39)
LDL Chol Calc (NIH): 128 mg/dL — ABNORMAL HIGH (ref 0–99)
Triglycerides: 152 mg/dL — ABNORMAL HIGH (ref 0–149)
VLDL Cholesterol Cal: 27 mg/dL (ref 5–40)

## 2020-03-11 LAB — HEMOGLOBIN A1C
Est. average glucose Bld gHb Est-mCnc: 120 mg/dL
Hgb A1c MFr Bld: 5.8 % — ABNORMAL HIGH (ref 4.8–5.6)

## 2020-03-13 ENCOUNTER — Other Ambulatory Visit: Payer: Self-pay | Admitting: Internal Medicine

## 2020-03-24 ENCOUNTER — Telehealth: Payer: Self-pay | Admitting: Internal Medicine

## 2020-03-30 ENCOUNTER — Other Ambulatory Visit: Payer: Self-pay

## 2020-03-30 MED ORDER — ONDANSETRON 4 MG PO TBDP: 4.0000 mg | ORAL_TABLET | Freq: Three times a day (TID) | ORAL | 2 refills | Status: DC | PRN

## 2020-03-30 NOTE — Telephone Encounter (Signed)
Spoke to Dr. Roselee Culver My opinion is that patient has been and is unable to work and hol djob due to GI sxs

## 2020-04-02 ENCOUNTER — Ambulatory Visit (HOSPITAL_COMMUNITY)
Admission: EM | Admit: 2020-04-02 | Discharge: 2020-04-02 | Disposition: A | Discharge status: Home / Self Care | Payer: 59 | Attending: Family Medicine | Admitting: Family Medicine

## 2020-04-02 ENCOUNTER — Encounter (HOSPITAL_COMMUNITY): Payer: Self-pay | Admitting: Emergency Medicine

## 2020-04-02 ENCOUNTER — Other Ambulatory Visit: Payer: Self-pay

## 2020-04-02 DIAGNOSIS — S29019A Strain of muscle and tendon of unspecified wall of thorax, initial encounter: Secondary | ICD-10-CM

## 2020-04-02 MED ORDER — CYCLOBENZAPRINE HCL 10 MG PO TABS: 10.0000 mg | ORAL_TABLET | Freq: Three times a day (TID) | ORAL | 0 refills | Status: DC | PRN

## 2020-04-02 MED ORDER — TRAMADOL HCL 50 MG PO TABS: 50.0000 mg | ORAL_TABLET | Freq: Four times a day (QID) | ORAL | 0 refills | Status: DC | PRN

## 2020-04-02 NOTE — Discharge Instructions (Signed)
Take Tramadol as needed for pain.  Take Cyclobenzaprine as needed in the evening before bed. Use heat pad for 10-15 3x per day with passive movement exercises

## 2020-04-02 NOTE — ED Triage Notes (Signed)
Patient arrived by self from home. Patient c/o LT sided lower back pain that started on Sunday. Patient was bending down when pain was triggered.   Patient stated "it feels like someone is grabbing my back and it also spasms".   History of Migraines, Kidney Stones   Patient took Flexeril, no relief with medication.

## 2020-04-02 NOTE — ED Provider Notes (Signed)
Craig Guzman    CSN: [id number] Arrival date & time: 04/02/20  1019      History   Chief Complaint Chief Complaint  Patient presents with   Back Pain    HPI Craig Guzman. is a 49 y.o. male.   HPI  49 year old male presents with midthoracic back pain.  Patient stated that he bent down to pick up something in the laundry room and felt a muscle spasm in his back.  Event occurred 5 days ago.  Patient has noticeable tenderness when extending.  Pain waxes and wanes but is typically worse when he gets out of bed.  Denies any traumatic event.  Denies any extremity paresthesia, weakness.  Denies saddle anesthesia, bowel or urinary changes, fevers, nausea, vomiting.  He took cyclobenzaprine at night which she had for migraines to help him sleep.  Patient's blood pressure was notably elevated in triage.  Patient denies any symptoms such as chest pain, difficulty breathing, visual changes, headache.  He states that he regularly checks his blood pressure at home and that it is just high because he is in a good amount of pain.  He reports that his blood pressures typically run in the 130s over 90s.    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 03/21/2019   Postsurgical dumping  syndrome 02/14/2019   S/P functional endoscopic sinus surgery 11/27/2018   Abdominal pain, epigastric    Angioedema 04/19/2018   Intractable vomiting with nausea    Cigarette nicotine dependence without complication 62/09/5595   Shifting sleep-work schedule, affecting sleep 05/15/2016   Sleep deprivation 05/15/2016   Sleep related hypoventilation in conditions classified elsewhere 05/15/2016   Sleep related headaches 05/15/2016   Morbid obesity ([place]) 09/19/2013   Diarrhea 07/10/2013   Colloid thyroid nodule 04/04/2013   Hypertension 11/26/2012   Gastroparesis??? 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[place]) 05/17/2011   GERD (gastroesophageal reflux disease) 04/21/2011    Past Surgical History:  Procedure Laterality Date   BALLOON DILATION N/A 12/06/2017   Procedure: BALLOON DILATION;  Surgeon: Gatha Mayer, MD;  Location: [facility];  Service: Endoscopy;  Laterality: N/A;   BIOPSY  06/21/2018   Procedure: BIOPSY;  Surgeon: Gatha Mayer, MD;  Location: [facility];  Service: Endoscopy;;   CHOLECYSTECTOMY  2011   [place]   CIRCUMCISION N/A 12/09/2018   Procedure: CIRCUMCISION ADULT;  Surgeon: Lucas Mallow, MD;  Location: [facility];  Service: Urology;  Laterality: N/A;   COLONOSCOPY  07/26/11   Crohn's colitis, ileitis  ESOPHAGOGASTRODUODENOSCOPY  05/04/11, 07/26/11   granulomatous gastritis - Crohn's   ESOPHAGOGASTRODUODENOSCOPY (EGD) WITH PROPOFOL N/A 03/28/2017   Procedure: ESOPHAGOGASTRODUODENOSCOPY (EGD) WITH PROPOFOL  with balloon dilation of duodenum;  Surgeon: [name], MD;  Location: [facility];  Service: Gastroenterology;  Laterality: N/A;   ESOPHAGOGASTRODUODENOSCOPY (EGD) WITH PROPOFOL N/A 12/06/2017   Procedure: ESOPHAGOGASTRODUODENOSCOPY (EGD) WITH PROPOFOL;  Surgeon: Gatha Mayer, MD;  Location: [facility];   Service: Endoscopy;  Laterality: N/A;   ESOPHAGOGASTRODUODENOSCOPY (EGD) WITH PROPOFOL N/A 06/21/2018   Procedure: ESOPHAGOGASTRODUODENOSCOPY (EGD) WITH PROPOFOL;  Surgeon: Gatha Mayer, MD;  Location: [facility];  Service: Endoscopy;  Laterality: N/A;   ETHMOIDECTOMY N/A 11/27/2018   Procedure: ENDOSCOPIC TOTAL ETHMOIDECTOMY;  Surgeon: Leta Baptist, MD;  Location: [facility];  Service: ENT;  Laterality: N/A;   FLEXIBLE SIGMOIDOSCOPY     GASTROJEJUNOSTOMY  01/2018   MULTIPLE TOOTH EXTRACTIONS  2005   NASAL SEPTOPLASTY W/ TURBINOPLASTY Bilateral 11/27/2018   Procedure: NASAL SEPTOPLASTY WITH BILATERAL TURBINATE REDUCTION;  Surgeon: Leta Baptist, MD;  Location: [facility];  Service: ENT;  Laterality: Bilateral;   SHOULDER SURGERY     right   SINUS ENDO WITH FUSION N/A 11/27/2018   Procedure: SINUS ENDO WITH FUSION;  Surgeon: Leta Baptist, MD;  Location: [facility];  Service: ENT;  Laterality: N/A;   UPPER GASTROINTESTINAL ENDOSCOPY     VASECTOMY     bilateral w/lysis of penile adhesions       Home Medications    Prior to Admission medications   Medication Sig Start Date End Date Taking? Authorizing Provider  doxazosin (CARDURA) 8 MG tablet Take 1 tablet (8 mg total) by mouth at bedtime. 03/10/20  Yes [name], MD  famotidine (PEPCID) 40 MG tablet Take 1 tablet (40 mg total) by mouth daily. 03/05/20  Yes Gatha Mayer, MD  olmesartan (BENICAR) 20 MG tablet Take 1 tablet (20 mg total) by mouth daily. 03/10/20  Yes [name], MD  ondansetron (ZOFRAN-ODT) 4 MG disintegrating tablet Take 1 tablet (4 mg total) by mouth every 8 (eight) hours as needed for nausea or vomiting. 03/30/20  Yes Gatha Mayer, MD  rizatriptan (MAXALT-MLT) 10 MG disintegrating tablet TAKE 1 TABLET 3 TIME A DA Y AS NEEDED FOR MIGRAINE 01/26/20  Yes Gatha Mayer, MD  verapamil (CALAN-SR) 180 MG CR tablet Take 2 tablets (360 mg total) by mouth daily. 03/10/20  Yes [name], MD  cyclobenzaprine (FLEXERIL) 10 MG  tablet Take 1 tablet (10 mg total) by mouth 3 (three) times daily as needed for muscle spasms. 04/02/20   Raylene Everts, MD  hyoscyamine (ANASPAZ) 0.125 MG TBDP disintergrating tablet Place 1 tablet (0.125 mg total) under the tongue every 4 (four) hours as needed for cramping. 03/05/20   Gatha Mayer, MD  prochlorperazine (COMPAZINE) 25 MG suppository Place 1 suppository (25 mg total) rectally every 12 (twelve) hours as needed for nausea or vomiting. 08/07/18   Gatha Mayer, MD  traMADol (ULTRAM) 50 MG tablet Take 1 tablet (50 mg total) by mouth every 6 (six) hours as needed. 04/02/20   Raylene Everts, MD    Family History Family History  Problem Relation Age of Onset   Colon polyps Father    Diabetes Father    Hypertension Father    Hypertension Mother    Asthma Mother    Heart disease Maternal Grandmother    Colon cancer Neg Hx    Rectal cancer Neg Hx  Stomach cancer Neg Hx    Esophageal cancer Neg Hx     Social History Social History   Tobacco Use   Smoking status: Former Smoker    Packs/day: 0.75    Years: 21.00    Pack years: 15.75    Types: Cigarettes    Quit date: 12/01/2017    Years since quitting: 2.3   Smokeless tobacco: Former Systems developer    Types: Snuff   Tobacco comment: Counseling sheet given in exam room   Vaping Use   Vaping Use: Never used  Substance Use Topics   Alcohol use: Not Currently   Drug use: No     Allergies   Zithromax [azithromycin], Lisinopril, and Tessalon perles [benzonatate]   Review of Systems Review of Systems See HPI  Physical Exam Triage Vital Signs ED Triage Vitals  Enc Vitals Group     BP 04/02/20 1115 (!) 161/122     Pulse Rate 04/02/20 1115 97     Resp 04/02/20 1115 17     Temp 04/02/20 1115 98.3 F (36.8 C)     Temp Source 04/02/20 1115 Oral     SpO2 04/02/20 1115 100 %     Weight 04/02/20 1119 (!) 347 lb 0.1 oz (157.4 kg)     Height 04/02/20 1119 6' (1.829 m)     Head Circumference --       Peak Flow --      Pain Score 04/02/20 1119 5     Pain Loc --      Pain Edu? --      Excl. in [place]? --    No data found.  Updated Vital Signs BP (!) 161/122 (BP Location: Right Arm)    Pulse 97    Temp 98.3 F (36.8 C) (Oral)    Resp 17    Ht 6' (1.829 m)    Wt (!) 157.4 kg    SpO2 100%    BMI 47.06 kg/m   Visual Acuity Right Eye Distance:   Left Eye Distance:   Bilateral Distance:    Right Eye Near:   Left Eye Near:    Bilateral Near:     Physical Exam Constitutional:      General: He is not in acute distress.    Appearance: He is well-developed.  HENT:     Head: Normocephalic and atraumatic.  Eyes:     Conjunctiva/sclera: Conjunctivae normal.     Pupils: Pupils are equal, round, and reactive to light.  Cardiovascular:     Rate and Rhythm: Normal rate.  Pulmonary:     Effort: Pulmonary effort is normal. No respiratory distress.  Abdominal:     General: There is no distension.     Palpations: Abdomen is soft.  Musculoskeletal:        General: Tenderness present. No swelling, deformity or signs of injury. Normal range of motion.       Arms:     Cervical back: Normal range of motion.  Skin:    General: Skin is warm and dry.  Neurological:     Mental Status: He is alert.      UC Treatments / Results  Labs (all labs ordered are listed, but only abnormal results are displayed) Labs Reviewed - No data to display  EKG   Radiology No results found.  Procedures Procedures (including critical care time)  Medications Ordered in UC Medications - No data to display  Initial Impression / Assessment and Plan / UC Course  I have  reviewed the triage vital signs and the nursing notes.  Pertinent labs & imaging results that were available during my care of the patient were reviewed by me and considered in my medical decision making (see chart for details).     **Patient's presentation most consistent with mid back muscle strain.  No traumatic event or midline  tenderness so imaging not indicated.  Will manage conservatively with cyclobenzaprine, tramadol as needed for pain.  Patient has extensive medical history which eliminates the use of NSAIDs.  Patient instructed to heat area of concern 10 to 15 minutes 3 times a day and then to do some light, passive stretching movements to keep mobility.  Patient instructed to follow-up if pain does not relieve in a week.  PT may be encouraged in the future. Final Clinical Impressions(s) / UC Diagnoses   Final diagnoses:  Thoracic myofascial strain, initial encounter     Discharge Instructions     Take Tramadol as needed for pain.  Take Cyclobenzaprine as needed in the evening before bed. Use heat pad for 10-15 3x per day with passive movement exercises   ED Prescriptions    Medication Sig Dispense Auth. Provider   cyclobenzaprine (FLEXERIL) 10 MG tablet Take 1 tablet (10 mg total) by mouth 3 (three) times daily as needed for muscle spasms. 50 tablet Raylene Everts, MD   traMADol (ULTRAM) 50 MG tablet Take 1 tablet (50 mg total) by mouth every 6 (six) hours as needed. 15 tablet Raylene Everts, MD     I have reviewed the PDMP during this encounter.   Raylene Everts, MD 04/02/20 1325

## 2020-09-08 ENCOUNTER — Ambulatory Visit (INDEPENDENT_AMBULATORY_CARE_PROVIDER_SITE_OTHER): Payer: Medicare (Managed Care) | Admitting: Family Medicine

## 2020-09-08 ENCOUNTER — Encounter: Payer: Self-pay | Admitting: Family Medicine

## 2020-09-08 ENCOUNTER — Other Ambulatory Visit: Payer: Self-pay

## 2020-09-08 VITALS — BP 137/85 | HR 107 | Temp 97.9°F | Ht 72.0 in | Wt 352.0 lb

## 2020-09-08 DIAGNOSIS — E785 Hyperlipidemia, unspecified: Secondary | ICD-10-CM

## 2020-09-08 DIAGNOSIS — K144 Atrophy of tongue papillae: Secondary | ICD-10-CM

## 2020-09-08 DIAGNOSIS — I10 Essential (primary) hypertension: Secondary | ICD-10-CM

## 2020-09-08 DIAGNOSIS — R7303 Prediabetes: Secondary | ICD-10-CM | POA: Diagnosis not present

## 2020-09-08 DIAGNOSIS — R351 Nocturia: Secondary | ICD-10-CM | POA: Diagnosis not present

## 2020-09-08 DIAGNOSIS — K149 Disease of tongue, unspecified: Secondary | ICD-10-CM | POA: Diagnosis not present

## 2020-09-08 DIAGNOSIS — H04203 Unspecified epiphora, bilateral lacrimal glands: Secondary | ICD-10-CM | POA: Diagnosis not present

## 2020-09-08 DIAGNOSIS — K50019 Crohn's disease of small intestine with unspecified complications: Secondary | ICD-10-CM

## 2020-09-08 LAB — POCT SKIN KOH: Skin KOH, POC: NEGATIVE

## 2020-09-08 MED ORDER — AZELASTINE HCL 0.05 % OP SOLN: 1.0000 [drp] | Freq: Two times a day (BID) | OPHTHALMIC | 3 refills | Status: DC

## 2020-09-08 MED ORDER — VERAPAMIL HCL ER 180 MG PO TBCR: 360.0000 mg | EXTENDED_RELEASE_TABLET | Freq: Every day | ORAL | 1 refills | Status: DC

## 2020-09-08 MED ORDER — OLMESARTAN MEDOXOMIL 20 MG PO TABS: 20.0000 mg | ORAL_TABLET | Freq: Every day | ORAL | 1 refills | Status: DC

## 2020-09-08 MED ORDER — CLOTRIMAZOLE 10 MG MT TROC: 10.0000 mg | Freq: Every day | OROMUCOSAL | 0 refills | Status: DC

## 2020-09-08 MED ORDER — DOXAZOSIN MESYLATE 8 MG PO TABS: 8.0000 mg | ORAL_TABLET | Freq: Every day | ORAL | 1 refills | Status: DC

## 2020-09-08 NOTE — Progress Notes (Signed)
Subjective:  Patient ID: Craig Clark., male    DOB: 1971/01/28  Age: 50 y.o. N: [id number]  CC:  Chief Complaint  Patient presents with  . Follow-up    On hypertension,hyperlipidemia, and prediabetes. Pt reports no issues with BP since last OV. Pt reports no change in exercise since last OV. Pt reports being almost vegetarian. Pt reports current medication works fine with no side effects. Pt want the provider to know his Cardura,Benicar, and calan-sr medications need to be sent to [facility] on his chart.  . Allergies    PT would like the providers thoughts on these symptoms. Pt states his eyes keep building up crust throughout the day no just while he sleeps. Pt also states his tung feels irritated and the middle feels like sand paper.    HPI Craig Guzman. presents for   Concerns above.  Hypertension: Tolerating current meds, no new side effects. Doxazosin, olmesartan, verapamil. Home readings:130-140/85-90.  BP Readings from Last 3 Encounters:  09/08/20 137/85  04/02/20 (!) 161/122  03/10/20 (!) 141/92   Lab Results  Component Value Date   CREATININE 0.99 03/10/2020   Hyperlipidemia: No current statin - has not taken. Elevated readings in September.  Borderline alk phos, albumin on previous labs.  ASCVD risk score 10%. Lab Results  Component Value Date   CHOL 186 03/10/2020   HDL 31 (L) 03/10/2020   LDLCALC 128 (H) 03/10/2020   TRIG 152 (H) 03/10/2020   CHOLHDL 6.0 (H) 03/10/2020   Lab Results  Component Value Date   ALT 62 (H) 03/10/2020   AST 84 (H) 03/10/2020   ALKPHOS 136 (H) 03/10/2020   BILITOT 0.4 03/10/2020    Prediabetes: With obesity Weight has increased since September.  Walking 10-53mn when parking - 2 times per day.  Fast food - occasional. Soda - 20oz per day.  Take out food - sometimes.  Body mass index is 47.74 kg/m.   Lab Results  Component Value Date   HGBA1C 5.8 (H) 03/10/2020   Wt Readings from Last 3 Encounters:   09/08/20 (!) 352 lb (159.7 kg)  04/02/20 (!) 347 lb 0.1 oz (157.4 kg)  03/10/20 (!) 347 lb (157.4 kg)    Eye crusting: Eyes irritated, allergies. Some crusting during day. No contact lenses.  Crust in corners. Past month.  Tongue feels irritated and middle of tongue feels like sandpaper past few months. Tart food irritates.  Tried old zyrtec for 1 week. Helped pnd.  No current eye specialist.   History Patient Active Problem List   Diagnosis Date Noted  . Abdominal migraine, not intractable ? 03/21/2019  . Postsurgical dumping syndrome 02/14/2019  . S/P functional endoscopic sinus surgery 11/27/2018  . Abdominal pain, epigastric   . Angioedema 04/19/2018  . Intractable vomiting with nausea   . Cigarette nicotine dependence without complication 186/76/1949 . Shifting sleep-work schedule, affecting sleep 05/15/2016  . Sleep deprivation 05/15/2016  . Sleep related hypoventilation in conditions classified elsewhere 05/15/2016  . Sleep related headaches 05/15/2016  . [place] 09/19/2013  . Diarrhea 07/10/2013  . 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 Hypertension 11/26/2012  . Gastroparesis??? 12/08/2011  . Common migraine without aura 12/08/2011  . Long-term use of immunosuppressant medication 08/08/2011  . Gastroduodenal [place] 05/17/[place]  . GERD (gastroesophageal reflux disease) 04/21/2011   Past Medical History:  Diagnosis Date  . Allergy    trees/ grasses/ animals/dust/mold  . Biliary dyskinesia   . Colloid thyroid nodule   .  Crohn's disease ([place])    Stomach, terminal ileum, cecum  . Crohn's disease ([place])   . Dyspnea    due to nasal /sinus congestion  . Gastric ulcer    antral  . GERD (gastroesophageal reflux disease)   . 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 History of migraine headaches   . HTN (hypertension)   . Kidney stone 09/20/2012  . Migraine   . Obesity   . Pneumonia 02/27/2014    ED  . Postsurgical dumping syndrome 02/14/2019  . Sleep apnea    has cpap- does not wear    Past Surgical History:  Procedure Laterality Date  . BALLOON DILATION N/A 12/06/2017   Procedure: BALLOON DILATION;  Surgeon: Gatha Mayer, MD;  Location: [facility];  Service: Endoscopy;  Laterality: N/A;  . BIOPSY  06/21/2018   Procedure: BIOPSY;  Surgeon: Gatha Mayer, MD;  Location: [facility];  Service: Endoscopy;;  . CHOLECYSTECTOMY  2011   [place]  . CIRCUMCISION N/A 12/09/2018   Procedure: CIRCUMCISION ADULT;  Surgeon: Lucas Mallow, MD;  Location: [facility];  Service: Urology;  Laterality: N/A;  . COLONOSCOPY  07/26/11   Crohn's colitis, ileitis  . ESOPHAGOGASTRODUODENOSCOPY  05/04/11, 07/26/11   granulomatous gastritis - Crohn's  . ESOPHAGOGASTRODUODENOSCOPY (EGD) WITH PROPOFOL N/A 03/28/2017   Procedure: ESOPHAGOGASTRODUODENOSCOPY (EGD) WITH PROPOFOL  with balloon dilation of duodenum;  Surgeon: [name], MD;  Location: [facility];  Service: Gastroenterology;  Laterality: N/A;  . ESOPHAGOGASTRODUODENOSCOPY (EGD) WITH PROPOFOL N/A 12/06/2017   Procedure: ESOPHAGOGASTRODUODENOSCOPY (EGD) WITH PROPOFOL;  Surgeon: Gatha Mayer, MD;  Location: [facility];  Service: Endoscopy;  Laterality: N/A;  . ESOPHAGOGASTRODUODENOSCOPY (EGD) WITH PROPOFOL N/A 06/21/2018   Procedure: ESOPHAGOGASTRODUODENOSCOPY (EGD) WITH PROPOFOL;  Surgeon: Gatha Mayer, MD;  Location: [facility];  Service: Endoscopy;  Laterality: N/A;  . ETHMOIDECTOMY N/A 11/27/2018   Procedure: ENDOSCOPIC TOTAL ETHMOIDECTOMY;  Surgeon: Leta Baptist, MD;  Location: [place];  Service: ENT;  Laterality: N/A;  . FLEXIBLE SIGMOIDOSCOPY    . GASTROJEJUNOSTOMY  01/2018  . MULTIPLE TOOTH EXTRACTIONS  2005  . NASAL SEPTOPLASTY W/ TURBINOPLASTY Bilateral 11/27/2018   Procedure: NASAL SEPTOPLASTY WITH BILATERAL TURBINATE REDUCTION;  Surgeon: Leta Baptist, MD;  Location: [facility];  Service: ENT;  Laterality: Bilateral;  . SHOULDER  SURGERY     right  . SINUS ENDO WITH FUSION N/A 11/27/2018   Procedure: SINUS ENDO WITH FUSION;  Surgeon: Leta Baptist, MD;  Location: [place];  Service: ENT;  Laterality: N/A;  . UPPER GASTROINTESTINAL ENDOSCOPY    . VASECTOMY     bilateral w/lysis of penile adhesions   Allergies  Allergen Reactions  . Zithromax [Azithromycin] Hypertension  . Lisinopril Swelling    SWELLING REACTION UNSPECIFIED   . Tessalon Perles [Benzonatate]     hypertension   Prior to Admission medications   Medication Sig Start Date End Date Taking? Authorizing Provider  cyclobenzaprine (FLEXERIL) 10 MG tablet Take 1 tablet (10 mg total) by mouth 3 (three) times daily as needed for muscle spasms. 04/02/20  Yes Raylene Everts, MD  doxazosin (CARDURA) 8 MG tablet Take 1 tablet (8 mg total) by mouth at bedtime. 03/10/20  Yes [name], MD  famotidine (PEPCID) 40 MG tablet Take 1 tablet (40 mg total) by mouth daily. 03/05/20  Yes Gatha Mayer, MD  olmesartan (BENICAR) 20 MG tablet Take 1 tablet (20 mg total) by mouth  daily. 03/10/20  Yes [name], MD  ondansetron (ZOFRAN-ODT) 4 MG disintegrating tablet Take 1 tablet (4 mg total) by mouth every 8 (eight) hours as needed for nausea or vomiting. 03/30/20  Yes Gatha Mayer, MD  rizatriptan (MAXALT-MLT) 10 MG disintegrating tablet TAKE 1 TABLET 3 TIME A DA Y AS NEEDED FOR MIGRAINE 01/26/20  Yes Gatha Mayer, MD  traMADol (ULTRAM) 50 MG tablet Take 1 tablet (50 mg total) by mouth every 6 (six) hours as needed. 04/02/20  Yes Raylene Everts, MD  verapamil (CALAN-SR) 180 MG CR tablet Take 2 tablets (360 mg total) by mouth daily. 03/10/20  Yes [name], MD  hyoscyamine (ANASPAZ) 0.125 MG TBDP disintergrating tablet Place 1 tablet (0.125 mg total) under the tongue every 4 (four) hours as needed for cramping. Patient not taking: Reported on 09/08/2020 03/05/20   Gatha Mayer, MD  prochlorperazine (COMPAZINE) 25 MG suppository Place 1 suppository (25 mg total)  rectally every 12 (twelve) hours as needed for nausea or vomiting. Patient not taking: Reported on 09/08/2020 08/07/18   Gatha Mayer, MD   Social History   Socioeconomic History  . Marital status: Married    Spouse name: Not on file  . Number of children: 3  . Years of education: Some college  . Highest education level: Not on file  Occupational History  . Occupation: Environmental health practitioner: [employer]  Tobacco Use  . Smoking status: Former Smoker    Packs/day: 0.75    Years: 21.00    Pack years: 15.75    Types: Cigarettes    Quit date: 12/01/2017    Years since quitting: 2.7  . Smokeless tobacco: Former Systems developer    Types: Snuff  . Tobacco comment: Counseling sheet given in exam room   Vaping Use  . Vaping Use: Never used  Substance and Sexual Activity  . Alcohol use: Not Currently  . Drug use: No  . Sexual activity: Not on file  Other Topics Concern  . Not on file  Social History Narrative   Married. Education: [employer].    Lives at home w/ his wife and Engineer, site for [employer] -on disability vocational rehab pursuing different career   Right-handed   Caffeine: 3 sodas per day   Occasional alcohol no drugs   Former smoker former snuff user   Social Determinants of Radio broadcast assistant Strain: Not on file  Food Insecurity: Not on file  Transportation Needs: Not on file  Physical Activity: Not on file  Stress: Not on file  Social Connections: Not on file  Intimate Partner Violence: Not on file    Review of Systems  Constitutional: Negative for fatigue and unexpected weight change.  Eyes: Negative for visual disturbance.  Respiratory: Negative for cough, chest tightness and shortness of breath.   Cardiovascular: Negative for chest pain, palpitations and leg swelling.  Gastrointestinal: Negative for abdominal pain and blood in stool.  Neurological: Negative for dizziness, light-headedness and headaches.   Other per HPI.  Objective:   Vitals:   09/08/20  1353  BP: 137/85  Pulse: (!) 107  Temp: 97.9 F (36.6 C)  TempSrc: Temporal  SpO2: 98%  Weight: (!) 352 lb (159.7 kg)  Height: 6' (1.829 m)     Physical Exam Vitals reviewed.  Constitutional:      Appearance: He is well-developed and well-nourished. He is obese.  HENT:     Head: Normocephalic and atraumatic.  Mouth/Throat:     Comments: Slight white coating to the dorsal tongue on the lateral aspect, with central clearing, slight smooth appearance to few areas in the middle of the tongue. Eyes:     Extraocular Movements: EOM normal.     Conjunctiva/sclera: Conjunctivae normal.     Pupils: Pupils are equal, round, and reactive to light.  Neck:     Vascular: No carotid bruit or JVD.  Cardiovascular:     Rate and Rhythm: Normal rate and regular rhythm.     Heart sounds: Normal heart sounds. No murmur heard.   Pulmonary:     Effort: Pulmonary effort is normal.     Breath sounds: Normal breath sounds. No rales.  Musculoskeletal:        General: No edema.  Skin:    General: Skin is warm and dry.  Neurological:     Mental Status: He is alert and oriented to person, place, and time.  Psychiatric:        Mood and Affect: Mood and affect normal.     Assessment & Plan:  Craig Coviello. is a 50 y.o. male . Prediabetes - Plan: Hemoglobin A1c  -Check A1c at last visit, dietary/exercise recommendations given.  Handout given.  Essential hypertension - Plan: verapamil (CALAN-SR) 180 MG CR tablet, olmesartan (BENICAR) 20 MG tablet, doxazosin (CARDURA) 8 MG tablet, Comprehensive metabolic panel  -Stable on current regimen, continue same  Nocturia - Plan: doxazosin (CARDURA) 8 MG tablet  -Stable, continue doxazosin  Watery eyes - Plan: azelastine (OPTIVAR) 0.05 % ophthalmic solution  -Trial of Optivar, over-the-counter Flonase for possible allergic cause.  RTC precautions  Hyperlipidemia, unspecified hyperlipidemia type - Plan: Lipid panel  -Check lipids, slight elevated  ASCVD risk score previously.  Tongue irritation - Plan: POCT Skin KOH, clotrimazole (MYCELEX) 10 MG troche Atrophic glossitis - Plan: clotrimazole (MYCELEX) 10 MG troche, Folate, Iron, Vitamin B12, CANCELED: Iron, CANCELED: Vitamin B12, CANCELED: Folate Gastroduodenal Crohn's disease with complication ([place]) - Plan: Folate, Iron, Vitamin B12, CANCELED: Iron, CANCELED: Vitamin B12, CANCELED: Folate   tongue.  irritation likely atrophic glossitis, check KOH, but will try Mycelex troches initially, check for nutritional deficiencies as above given history of Crohn's.  RTC precautions if persistent to evaluate for other causes.  Meds ordered this encounter  Medications  . verapamil (CALAN-SR) 180 MG CR tablet    Sig: Take 2 tablets (360 mg total) by mouth daily.    Dispense:  180 tablet    Refill:  1  . olmesartan (BENICAR) 20 MG tablet    Sig: Take 1 tablet (20 mg total) by mouth daily.    Dispense:  90 tablet    Refill:  1  . doxazosin (CARDURA) 8 MG tablet    Sig: Take 1 tablet (8 mg total) by mouth at bedtime.    Dispense:  90 tablet    Refill:  1  . azelastine (OPTIVAR) 0.05 % ophthalmic solution    Sig: Place 1 drop into both eyes 2 (two) times daily.    Dispense:  6 mL    Refill:  3  . clotrimazole (MYCELEX) 10 MG troche    Sig: Take 1 tablet (10 mg total) by mouth 5 (five) times daily.    Dispense:  70 Troche    Refill:  0   Patient Instructions     Cut back on sugar-containing beverages as much as possible, try to avoid fast food and be careful with takeout or restaurant food.  Walking or  other low intensity exercise most days per week with a minimum of 150 minutes/week can also help with weight management.  Continue same medications for now.  Try over-the-counter Flonase for allergies 1 spray in each nostril once per day.  Also tried allergy eyedrops as prescribed today.  If crusting does not improve, follow-up for recheck.  Tongue issues may be due to a condition called  atrophic glossitis.  Sometimes nutritional deficiencies can cause that or fungal infections.  I will check some labs today.  Avoid acidic or spicy foods for now.  Try Mycelex dissolvable lozenge 5 times per day for the next 2 weeks.  If symptoms or not improving in the next few weeks, return for recheck and to discuss other causes.  Return to the clinic or go to the nearest emergency room if any of your symptoms worsen or new symptoms occur.   Lab visit here tomorrow or  Here are a few lab drawing stations for you to have your labwork performed:  [place], [place], [place] [address]  [facility] [address] [place], [place] [address]     Diabetes Care, 44(Suppl 1), [phone]. https://doi.org/https://doi.org/10.2337/dc21-S003">  Prediabetes Prediabetes is when your blood sugar (blood glucose) level is higher than normal but not high enough for you to be diagnosed with type 2 diabetes. Having prediabetes puts you at risk for developing type 2 diabetes (type 2 diabetes mellitus). With certain lifestyle changes, you may be able to prevent or delay the onset of type 2 diabetes. This is important because type 2 diabetes can lead to serious complications, such as:  Heart disease.  Stroke.  Blindness.  Kidney disease.  Depression.  Poor circulation in the feet and legs. In severe cases, this could lead to surgical removal of a leg (amputation). What are the causes? The exact cause of prediabetes is not known. It may result from insulin resistance. Insulin resistance develops when cells in the body do not respond properly to insulin that the body makes. This can cause excess glucose to build up in the blood. High blood glucose (hyperglycemia) can develop. What increases the risk? The following factors may make you more likely to develop this condition:  You have a family member with type 2 diabetes.  You are older than 45 years.  You had a temporary form of diabetes  during a pregnancy (gestational diabetes).  You had polycystic ovary syndrome (PCOS).  You are overweight or obese.  You are inactive (sedentary).  You have a history of heart disease, including problems with cholesterol levels, high levels of blood fats, or high blood pressure. What are the signs or symptoms? You may have no symptoms. If you do have symptoms, they may include:  Increased hunger.  Increased thirst.  Increased urination.  Vision changes, such as blurry vision.  Tiredness (fatigue). How is this diagnosed? This condition can be diagnosed with blood tests. Your blood glucose may be checked with one or more of the following tests:  A fasting blood glucose (FBG) test. You will not be allowed to eat (you will fast) for at least 8 hours before a blood sample is taken.  An A1C blood test (hemoglobin A1C). This test provides information about blood glucose levels over the previous 2?3 months.  An oral glucose tolerance test (OGTT). This test measures your blood glucose at two points in time: ? After fasting. This is your baseline level. ? Two hours after you drink a beverage that contains glucose. You  may be diagnosed with prediabetes if:  Your FBG is 100?125 mg/dL (5.6-6.9 mmol/L).  Your A1C level is 5.7?6.4% (39-46 mmol/mol).  Your OGTT result is 140?199 mg/dL (7.8-11 mmol/L). These blood tests may be repeated to confirm your diagnosis.   How is this treated? Treatment may include dietary and lifestyle changes to help lower your blood glucose and prevent type 2 diabetes from developing. In some cases, medicine may be prescribed to help lower the risk of type 2 diabetes. Follow these instructions at home: Nutrition  Follow a healthy meal plan. This includes eating lean proteins, whole grains, legumes, fresh fruits and vegetables, low-fat dairy products, and healthy fats.  Follow instructions from your health care provider about eating or drinking  restrictions.  Meet with a dietitian to create a healthy eating plan that is right for you.   Lifestyle  Do moderate-intensity exercise for at least 30 minutes a day on 5 or more days each week, or as told by your health care provider. A mix of activities may be best, such as: ? Brisk walking, swimming, biking, and weight lifting.  Lose weight as told by your health care provider. Losing 5-7% of your body weight can reverse insulin resistance.  Do not drink alcohol if: ? Your health care provider tells you not to drink. ? You are pregnant, may be pregnant, or are planning to become pregnant.  If you drink alcohol: ? Limit how much you use to:  0-1 drink a day for women.  0-2 drinks a day for men. ? Be aware of how much alcohol is in your drink. In the [place] one drink equals one 12 oz bottle of beer (355 mL), one 5 oz glass of wine (148 mL), or one 1 oz glass of hard liquor (44 mL). General instructions  Take over-the-counter and prescription medicines only as told by your health care provider. You may be prescribed medicines that help lower the risk of type 2 diabetes.  Do not use any products that contain nicotine or tobacco, such as cigarettes, e-cigarettes, and chewing tobacco. If you need help quitting, ask your health care provider.  Keep all follow-up visits. This is important. Where to find more information  American Diabetes Association: www.diabetes.org  Academy of Nutrition and Dietetics: www.eatright.org  American Heart Association: www.heart.org Contact a health care provider if:  You have any of these symptoms: ? Increased hunger. ? Increased urination. ? Increased thirst. ? Fatigue. ? Vision changes, such as blurry vision. Get help right away if you:  Have shortness of breath.  Feel confused.  Vomit or feel like you may vomit. Summary  Prediabetes is when your blood sugar (blood glucose)level is higher than normal but not high enough for you to be  diagnosed with type 2 diabetes.  Having prediabetes puts you at risk for developing type 2 diabetes (type 2 diabetes mellitus).  Make lifestyle changes such as eating a healthy diet and exercising regularly to help prevent diabetes. Lose weight as told by your health care provider. This information is not intended to replace advice given to you by your health care provider. Make sure you discuss any questions you have with your health care provider. Document Revised: 09/18/2019 Document Reviewed: 09/18/2019 Elsevier Patient Education  2021 [place].   Preventing High Cholesterol Cholesterol is a white, waxy substance similar to fat that the human body needs to help build cells. The liver makes all the cholesterol that a person's body needs. Having high cholesterol (hypercholesterolemia) increases your  risk for heart disease and stroke. Extra or excess cholesterol comes from the food that you eat. High cholesterol can often be prevented with diet and lifestyle changes. If you already have high cholesterol, you can control it with diet, lifestyle changes, and medicines. How can high cholesterol affect me? If you have high cholesterol, fatty deposits (plaques) may build up on the walls of your blood vessels. The blood vessels that carry blood away from your heart are called arteries. Plaques make the arteries narrower and stiffer. This in turn can:  Restrict or block blood flow and cause blood clots to form.  Increase your risk for heart attack and stroke. What can increase my risk for high cholesterol? This condition is more likely to develop in people who:  Eat foods that are high in saturated fat or cholesterol. Saturated fat is mostly found in foods that come from animal sources.  Are overweight.  Are not getting enough exercise.  Have a family history of high cholesterol (familial hypercholesterolemia). What actions can I take to prevent this? Nutrition  Eat less saturated  fat.  Avoid trans fats (partially hydrogenated oils). These are often found in margarine and in some baked goods, fried foods, and snacks bought in packages.  Avoid precooked or cured meat, such as bacon, sausages, or meat loaves.  Avoid foods and drinks that have added sugars.  Eat more fruits, vegetables, and whole grains.  Choose healthy sources of protein, such as fish, poultry, lean cuts of red meat, beans, peas, lentils, and nuts.  Choose healthy sources of fat, such as: ? Nuts. ? Vegetable oils, especially olive oil. ? Fish that have healthy fats, such as omega-3 fatty acids. These fish include mackerel or salmon.   Lifestyle  Lose weight if you are overweight. Maintaining a healthy body mass index (BMI) can help prevent or control high cholesterol. It can also lower your risk for diabetes and high blood pressure. Ask your health care provider to help you with a diet and exercise plan to lose weight safely.  Do not use any products that contain nicotine or tobacco, such as cigarettes, e-cigarettes, and chewing tobacco. If you need help quitting, ask your health care provider. Alcohol use  Do not drink alcohol if: ? Your health care provider tells you not to drink. ? You are pregnant, may be pregnant, or are planning to become pregnant.  If you drink alcohol: ? Limit how much you use to:  0-1 drink a day for women.  0-2 drinks a day for men. ? Be aware of how much alcohol is in your drink. In the [place] one drink equals one 12 oz bottle of beer (355 mL), one 5 oz glass of wine (148 mL), or one 1 oz glass of hard liquor (44 mL). Activity  Get enough exercise. Do exercises as told by your health care provider.  Each week, do at least 150 minutes of exercise that takes a medium level of effort (moderate-intensity exercise). This kind of exercise: ? Makes your heart beat faster while allowing you to still be able to talk. ? Can be done in short sessions several times a day  or longer sessions a few times a week. For example, on 5 days each week, you could walk fast or ride your bike 3 times a day for 10 minutes each time.   Medicines  Your health care provider may recommend medicines to help lower cholesterol. This may be a medicine to lower the amount of cholesterol  that your liver makes. You may need medicine if: ? Diet and lifestyle changes have not lowered your cholesterol enough. ? You have high cholesterol and other risk factors for heart disease or stroke.  Take over-the-counter and prescription medicines only as told by your health care provider. General information  Manage your risk factors for high cholesterol. Talk with your health care provider about all your risk factors and how to lower your risk.  Manage other conditions that you have, such as diabetes or high blood pressure (hypertension).  Have blood tests to check your cholesterol levels at regular points in time as told by your health care provider.  Keep all follow-up visits as told by your health care provider. This is important. Where to find more information  American Heart Association: www.heart.org  National Heart, Lung, and Blood Institute: https://wilson-eaton.com/ Summary  High cholesterol increases your risk for heart disease and stroke. By keeping your cholesterol level low, you can reduce your risk for these conditions.  High cholesterol can often be prevented with diet and lifestyle changes.  Work with your health care provider to manage your risk factors, and have your blood tested regularly. This information is not intended to replace advice given to you by your health care provider. Make sure you discuss any questions you have with your health care provider. Document Revised: 04/01/2019 Document Reviewed: 04/01/2019 Elsevier Patient Education  2021 [employer].   If you have lab work done today you will be contacted with your lab results within the next 2 weeks.  If you  have not heard from [place] then please contact us. The fastest way to get your results is to register for My Chart.   IF you received an x-ray today, you will receive an invoice from [facility]. Please contact [facility] at [phone] with questions or concerns regarding your invoice.   IF you received labwork today, you will receive an invoice from [facility]. Please contact [facility] at [phone] with questions or concerns regarding your invoice.   Our billing staff will not be able to assist you with questions regarding bills from these companies.  You will be contacted with the lab results as soon as they are available. The fastest way to get your results is to activate your My Chart account. Instructions are located on the last page of this paperwork. If you have not heard from [place] regarding the results in 2 weeks, please contact this office.         Signed, Merri Ray, MD [facility]

## 2020-09-08 NOTE — Patient Instructions (Addendum)
Cut back on sugar-containing beverages as much as possible, try to avoid fast food and be careful with takeout or restaurant food.  Walking or other low intensity exercise most days per week with a minimum of 150 minutes/week can also help with weight management.  Continue same medications for now.  Try over-the-counter Flonase for allergies 1 spray in each nostril once per day.  Also tried allergy eyedrops as prescribed today.  If crusting does not improve, follow-up for recheck.  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  Sometimes nutritional deficiencies can cause that or fungal infections.  I will check some labs today.  Avoid acidic or spicy foods for now.  Try Mycelex dissolvable lozenge 5 times per day for the next 2 weeks.  If symptoms or not improving in the next few weeks, return for recheck and to discuss other causes.  Return to the clinic or go to the nearest emergency room if any of your symptoms worsen or new symptoms occur.   Lab visit here tomorrow or  Here are a few lab drawing stations for you to have your labwork performed:  [place], [place], [place] [address]  [facility] [address] [place], [place] [address]     Diabetes Care, 44(Suppl 1), [phone]. https://[email or web address]://doi.[email or web address]/[email or web address]-[email or web address]">  Prediabetes Prediabetes is when your blood sugar (blood glucose) level is higher than normal but not high enough for you to be diagnosed with type 2 diabetes. Having prediabetes puts you at risk for developing type 2 diabetes (type 2 diabetes mellitus). With certain lifestyle changes, you may be able to prevent or delay the onset of type 2 diabetes. This is important because type 2 diabetes can lead to serious complications, such as:  Heart disease.  Stroke.  Blindness.  Kidney disease.  Depression.  Poor circulation in the feet and legs. In severe cases, this could lead to surgical removal of a leg  (amputation). What are the causes? The exact cause of prediabetes is not known. It may result from insulin resistance. Insulin resistance develops when cells in the body do not respond properly to insulin that the body makes. This can cause excess glucose to build up in the blood. High blood glucose (hyperglycemia) can develop. What increases the risk? The following factors may make you more likely to develop this condition:  You have a family member with type 2 diabetes.  You are older than 45 years.  You had a temporary form of diabetes during a pregnancy (gestational diabetes).  You had polycystic ovary syndrome (PCOS).  You are overweight or obese.  You are inactive (sedentary).  You have a history of heart disease, including problems with cholesterol levels, high levels of blood fats, or high blood pressure. What are the signs or symptoms? You may have no symptoms. If you do have symptoms, they may include:  Increased hunger.  Increased thirst.  Increased urination.  Vision changes, such as blurry vision.  Tiredness (fatigue). How is this diagnosed? This condition can be diagnosed with blood tests. Your blood glucose may be checked with one or more of the following tests:  A fasting blood glucose (FBG) test. You will not be allowed to eat (you will fast) for at least 8 hours before a blood sample is taken.  An A1C blood test (hemoglobin A1C). This test provides information about blood glucose levels over the previous 2?3 months.  An oral glucose tolerance test (OGTT). This test measures your blood glucose  at two points in time: ? After fasting. This is your baseline level. ? Two hours after you drink a beverage that contains glucose. You may be diagnosed with prediabetes if:  Your FBG is 100?125 mg/dL (5.6-6.9 mmol/L).  Your A1C level is 5.7?6.4% (39-46 mmol/mol).  Your OGTT result is 140?199 mg/dL (7.8-11 mmol/L). These blood tests may be repeated to confirm your  diagnosis.   How is this treated? Treatment may include dietary and lifestyle changes to help lower your blood glucose and prevent type 2 diabetes from developing. In some cases, medicine may be prescribed to help lower the risk of type 2 diabetes. Follow these instructions at home: Nutrition  Follow a healthy meal plan. This includes eating lean proteins, whole grains, legumes, fresh fruits and vegetables, low-fat dairy products, and healthy fats.  Follow instructions from your health care provider about eating or drinking restrictions.  Meet with a dietitian to create a healthy eating plan that is right for you.   Lifestyle  Do moderate-intensity exercise for at least 30 minutes a day on 5 or more days each week, or as told by your health care provider. A mix of activities may be best, such as: ? Brisk walking, swimming, biking, and weight lifting.  Lose weight as told by your health care provider. Losing 5-7% of your body weight can reverse insulin resistance.  Do not drink alcohol if: ? Your health care provider tells you not to drink. ? You are pregnant, may be pregnant, or are planning to become pregnant.  If you drink alcohol: ? Limit how much you use to:  0-1 drink a day for women.  0-2 drinks a day for men. ? Be aware of how much alcohol is in your drink. In the [place] one drink equals one 12 oz bottle of beer (355 mL), one 5 oz glass of wine (148 mL), or one 1 oz glass of hard liquor (44 mL). General instructions  Take over-the-counter and prescription medicines only as told by your health care provider. You may be prescribed medicines that help lower the risk of type 2 diabetes.  Do not use any products that contain nicotine or tobacco, such as cigarettes, e-cigarettes, and chewing tobacco. If you need help quitting, ask your health care provider.  Keep all follow-up visits. This is important. Where to find more information  American Diabetes Association:  www.diabetes.org  Academy of Nutrition and Dietetics: www.eatright.org  American Heart Association: www.heart.org Contact a health care provider if:  You have any of these symptoms: ? Increased hunger. ? Increased urination. ? Increased thirst. ? Fatigue. ? Vision changes, such as blurry vision. Get help right away if you:  Have shortness of breath.  Feel confused.  Vomit or feel like you may vomit. Summary  Prediabetes is when your blood sugar (blood glucose)level is higher than normal but not high enough for you to be diagnosed with type 2 diabetes.  Having prediabetes puts you at risk for developing type 2 diabetes (type 2 diabetes mellitus).  Make lifestyle changes such as eating a healthy diet and exercising regularly to help prevent diabetes. Lose weight as told by your health care provider. This information is not intended to replace advice given to you by your health care provider. Make sure you discuss any questions you have with your health care provider. Document Revised: 09/18/2019 Document Reviewed: 09/18/2019 Elsevier Patient Education  2021 [employer].   Preventing High Cholesterol Cholesterol is a white, waxy substance similar to fat that  the human body needs to help build cells. The liver makes all the cholesterol that a person's body needs. Having high cholesterol (hypercholesterolemia) increases your risk for heart disease and stroke. Extra or excess cholesterol comes from the food that you eat. High cholesterol can often be prevented with diet and lifestyle changes. If you already have high cholesterol, you can control it with diet, lifestyle changes, and medicines. How can high cholesterol affect me? If you have high cholesterol, fatty deposits (plaques) may build up on the walls of your blood vessels. The blood vessels that carry blood away from your heart are called arteries. Plaques make the arteries narrower and stiffer. This in turn can:  Restrict or  block blood flow and cause blood clots to form.  Increase your risk for heart attack and stroke. What can increase my risk for high cholesterol? This condition is more likely to develop in people who:  Eat foods that are high in saturated fat or cholesterol. Saturated fat is mostly found in foods that come from animal sources.  Are overweight.  Are not getting enough exercise.  Have a family history of high cholesterol (familial hypercholesterolemia). What actions can I take to prevent this? Nutrition  Eat less saturated fat.  Avoid trans fats (partially hydrogenated oils). These are often found in margarine and in some baked goods, fried foods, and snacks bought in packages.  Avoid precooked or cured meat, such as bacon, sausages, or meat loaves.  Avoid foods and drinks that have added sugars.  Eat more fruits, vegetables, and whole grains.  Choose healthy sources of protein, such as fish, poultry, lean cuts of red meat, beans, peas, lentils, and nuts.  Choose healthy sources of fat, such as: ? Nuts. ? Vegetable oils, especially olive oil. ? Fish that have healthy fats, such as omega-3 fatty acids. These fish include mackerel or salmon.   Lifestyle  Lose weight if you are overweight. Maintaining a healthy body mass index (BMI) can help prevent or control high cholesterol. It can also lower your risk for diabetes and high blood pressure. Ask your health care provider to help you with a diet and exercise plan to lose weight safely.  Do not use any products that contain nicotine or tobacco, such as cigarettes, e-cigarettes, and chewing tobacco. If you need help quitting, ask your health care provider. Alcohol use  Do not drink alcohol if: ? Your health care provider tells you not to drink. ? You are pregnant, may be pregnant, or are planning to become pregnant.  If you drink alcohol: ? Limit how much you use to:  0-1 drink a day for women.  0-2 drinks a day for  men. ? Be aware of how much alcohol is in your drink. In the [place] one drink equals one 12 oz bottle of beer (355 mL), one 5 oz glass of wine (148 mL), or one 1 oz glass of hard liquor (44 mL). Activity  Get enough exercise. Do exercises as told by your health care provider.  Each week, do at least 150 minutes of exercise that takes a medium level of effort (moderate-intensity exercise). This kind of exercise: ? Makes your heart beat faster while allowing you to still be able to talk. ? Can be done in short sessions several times a day or longer sessions a few times a week. For example, on 5 days each week, you could walk fast or ride your bike 3 times a day for 10 minutes each time.  Medicines  Your health care provider may recommend medicines to help lower cholesterol. This may be a medicine to lower the amount of cholesterol that your liver makes. You may need medicine if: ? Diet and lifestyle changes have not lowered your cholesterol enough. ? You have high cholesterol and other risk factors for heart disease or stroke.  Take over-the-counter and prescription medicines only as told by your health care provider. General information  Manage your risk factors for high cholesterol. Talk with your health care provider about all your risk factors and how to lower your risk.  Manage other conditions that you have, such as diabetes or high blood pressure (hypertension).  Have blood tests to check your cholesterol levels at regular points in time as told by your health care provider.  Keep all follow-up visits as told by your health care provider. This is important. Where to find more information  American Heart Association: www.heart.org  National Heart, Lung, and Blood Institute: https://wilson-eaton.com/ Summary  High cholesterol increases your risk for heart disease and stroke. By keeping your cholesterol level low, you can reduce your risk for these conditions.  High cholesterol can often  be prevented with diet and lifestyle changes.  Work with your health care provider to manage your risk factors, and have your blood tested regularly. This information is not intended to replace advice given to you by your health care provider. Make sure you discuss any questions you have with your health care provider. Document Revised: 04/01/2019 Document Reviewed: 04/01/2019 Elsevier Patient Education  2021 [employer].   If you have lab work done today you will be contacted with your lab results within the next 2 weeks.  If you have not heard from [place] then please contact us. The fastest way to get your results is to register for My Chart.   IF you received an x-ray today, you will receive an invoice from [facility]. Please contact [facility] at [phone] with questions or concerns regarding your invoice.   IF you received labwork today, you will receive an invoice from [facility]. Please contact [facility] at [phone] with questions or concerns regarding your invoice.   Our billing staff will not be able to assist you with questions regarding bills from these companies.  You will be contacted with the lab results as soon as they are available. The fastest way to get your results is to activate your My Chart account. Instructions are located on the last page of this paperwork. If you have not heard from [place] regarding the results in 2 weeks, please contact this office.

## 2020-09-09 ENCOUNTER — Ambulatory Visit (INDEPENDENT_AMBULATORY_CARE_PROVIDER_SITE_OTHER): Payer: Medicare (Managed Care) | Admitting: Family Medicine

## 2020-09-09 DIAGNOSIS — R7303 Prediabetes: Secondary | ICD-10-CM

## 2020-09-09 DIAGNOSIS — I10 Essential (primary) hypertension: Secondary | ICD-10-CM

## 2020-09-09 DIAGNOSIS — K144 Atrophy of tongue papillae: Secondary | ICD-10-CM

## 2020-09-09 DIAGNOSIS — E785 Hyperlipidemia, unspecified: Secondary | ICD-10-CM

## 2020-09-09 DIAGNOSIS — K50019 Crohn's disease of small intestine with unspecified complications: Secondary | ICD-10-CM

## 2020-09-10 LAB — COMPREHENSIVE METABOLIC PANEL
ALT: 43 IU/L (ref 0–44)
AST: 55 IU/L — ABNORMAL HIGH (ref 0–40)
Albumin/Globulin Ratio: 1.1 — ABNORMAL LOW (ref 1.2–2.2)
Albumin: 4.1 g/dL (ref 4.0–5.0)
Alkaline Phosphatase: 167 IU/L — ABNORMAL HIGH (ref 44–121)
BUN/Creatinine Ratio: 11 (ref 9–20)
BUN: 10 mg/dL (ref 6–24)
Bilirubin Total: 0.5 mg/dL (ref 0.0–1.2)
CO2: 18 mmol/L — ABNORMAL LOW (ref 20–29)
Calcium: 9 mg/dL (ref 8.7–10.2)
Chloride: 103 mmol/L (ref 96–106)
Creatinine, Ser: 0.95 mg/dL (ref 0.76–1.27)
Globulin, Total: 3.7 g/dL (ref 1.5–4.5)
Glucose: 106 mg/dL — ABNORMAL HIGH (ref 65–99)
Potassium: 4.1 mmol/L (ref 3.5–5.2)
Sodium: 139 mmol/L (ref 134–144)
Total Protein: 7.8 g/dL (ref 6.0–8.5)
eGFR: 99 mL/min/{1.73_m2} (ref >59)

## 2020-09-10 LAB — HEMOGLOBIN A1C
Est. average glucose Bld gHb Est-mCnc: 120 mg/dL
Hgb A1c MFr Bld: 5.8 % — ABNORMAL HIGH (ref 4.8–5.6)

## 2020-09-10 LAB — VITAMIN B12: Vitamin B-12: 198 pg/mL — ABNORMAL LOW (ref 232–1245)

## 2020-09-10 LAB — LIPID PANEL
Chol/HDL Ratio: 5.5 ratio — ABNORMAL HIGH (ref 0.0–5.0)
Cholesterol, Total: 187 mg/dL (ref 100–199)
HDL: 34 mg/dL — ABNORMAL LOW (ref >39)
LDL Chol Calc (NIH): 130 mg/dL — ABNORMAL HIGH (ref 0–99)
Triglycerides: 126 mg/dL (ref 0–149)
VLDL Cholesterol Cal: 23 mg/dL (ref 5–40)

## 2020-09-10 LAB — FOLATE: Folate: 4.6 ng/mL (ref >3.0)

## 2020-09-10 LAB — IRON: Iron: 71 ug/dL (ref 38–169)

## 2020-09-19 ENCOUNTER — Other Ambulatory Visit: Payer: Self-pay | Admitting: Family Medicine

## 2020-09-19 DIAGNOSIS — E538 Deficiency of other specified B group vitamins: Secondary | ICD-10-CM

## 2020-10-20 ENCOUNTER — Encounter: Payer: Self-pay | Admitting: Internal Medicine

## 2020-10-20 ENCOUNTER — Other Ambulatory Visit (INDEPENDENT_AMBULATORY_CARE_PROVIDER_SITE_OTHER): Payer: Medicare (Managed Care)

## 2020-10-20 ENCOUNTER — Ambulatory Visit: Payer: Medicare (Managed Care) | Admitting: Internal Medicine

## 2020-10-20 VITALS — BP 114/64 | HR 76 | Ht 72.0 in | Wt 352.0 lb

## 2020-10-20 DIAGNOSIS — R1013 Epigastric pain: Secondary | ICD-10-CM

## 2020-10-20 DIAGNOSIS — R945 Abnormal results of liver function studies: Secondary | ICD-10-CM

## 2020-10-20 DIAGNOSIS — Z903 Acquired absence of stomach [part of]: Secondary | ICD-10-CM

## 2020-10-20 DIAGNOSIS — Z6841 Body Mass Index (BMI) 40.0 and over, adult: Secondary | ICD-10-CM

## 2020-10-20 DIAGNOSIS — K50018 Crohn's disease of small intestine with other complication: Secondary | ICD-10-CM

## 2020-10-20 DIAGNOSIS — K912 Postsurgical malabsorption, not elsewhere classified: Secondary | ICD-10-CM | POA: Diagnosis not present

## 2020-10-20 DIAGNOSIS — E538 Deficiency of other specified B group vitamins: Secondary | ICD-10-CM

## 2020-10-20 DIAGNOSIS — R7989 Other specified abnormal findings of blood chemistry: Secondary | ICD-10-CM

## 2020-10-20 DIAGNOSIS — K911 Postgastric surgery syndromes: Secondary | ICD-10-CM

## 2020-10-20 LAB — HEPATIC FUNCTION PANEL
ALT: 44 U/L (ref 0–53)
AST: 49 U/L — ABNORMAL HIGH (ref 0–37)
Albumin: 3.8 g/dL (ref 3.5–5.2)
Alkaline Phosphatase: 134 U/L — ABNORMAL HIGH (ref 39–117)
Bilirubin, Direct: 0.1 mg/dL (ref 0.0–0.3)
Total Bilirubin: 0.5 mg/dL (ref 0.2–1.2)
Total Protein: 8.2 g/dL (ref 6.0–8.3)

## 2020-10-20 LAB — CBC
HCT: 41.2 % (ref 39.0–52.0)
Hemoglobin: 13.7 g/dL (ref 13.0–17.0)
MCHC: 33.3 g/dL (ref 30.0–36.0)
MCV: 79.8 fl (ref 78.0–100.0)
Platelets: 241 10*3/uL (ref 150.0–400.0)
RBC: 5.16 Mil/uL (ref 4.22–5.81)
RDW: 16 % — ABNORMAL HIGH (ref 11.5–15.5)
WBC: 4.9 10*3/uL (ref 4.0–10.5)

## 2020-10-20 LAB — VITAMIN D 25 HYDROXY (VIT D DEFICIENCY, FRACTURES): VITD: 8.11 ng/mL — ABNORMAL LOW (ref 30.00–100.00)

## 2020-10-20 LAB — VITAMIN B12: Vitamin B-12: 209 pg/mL — ABNORMAL LOW (ref 211–911)

## 2020-10-20 LAB — FERRITIN: Ferritin: 42.4 ng/mL (ref 22.0–322.0)

## 2020-10-20 MED ORDER — PROCHLORPERAZINE 25 MG RE SUPP: 25.0000 mg | Freq: Two times a day (BID) | RECTAL | 1 refills | Status: DC | PRN

## 2020-10-20 NOTE — Patient Instructions (Signed)
Your provider has requested that you go to the basement level for lab work before leaving today. Press "B" on the elevator. The lab is located at the first door on the left as you exit the elevator.  Due to recent changes in healthcare laws, you may see the results of your imaging and laboratory studies on MyChart before your provider has had a chance to review them.  We understand that in some cases there may be results that are confusing or concerning to you. Not all laboratory results come back in the same time frame and the provider may be waiting for multiple results in order to interpret others.  Please give [place] 48 hours in order for your provider to thoroughly review all the results before contacting the office for clarification of your results.   We have sent the following medications to your pharmacy for you to pick up at your convenience: Compazine   You have been scheduled for an endoscopy. Please follow written instructions given to you at your visit today. If you use inhalers (even only as needed), please bring them with you on the day of your procedure.  I appreciate the opportunity to care for you. Silvano Rusk, MD, [facility]

## 2020-10-20 NOTE — Progress Notes (Signed)
Craig Guzman. 50 y.o. 1971-04-15 [id number]  Assessment & Plan:   Encounter Diagnoses  Name Primary?  . Gastroduodenal Crohn's disease with other complication ([place]) Yes  . Abdominal pain, epigastric   . Postsurgical dumping syndrome   . Abnormal LFTs   . Postgastrectomy malabsorption   . Class 3 severe obesity due to excess calories with serious comorbidity and body mass index (BMI) of 45.0 to 49.9 in adult [facility])   . B12 deficiency      Continued problems ? Recurrent Crohn's issues vs other dysfx  Lab eval as below  Plan to try to address obesity after work-uop complete  Hold colonoscopy in reserve - no inflammation on 2019 exam and neg calprotectin last year for similar c/o  Orders Placed This Encounter  Procedures  . B12  . Methylmalonic Acid  . Ferritin  . CBC  . Hepatic function panel  . Vitamin B1  . Vitamin D (25 hydroxy)  . Ambulatory referral to Gastroenterology    EGD  Meds ordered this encounter  Medications  . prochlorperazine (COMPAZINE) 25 MG suppository    Sig: Place 1 suppository (25 mg total) rectally every 12 (twelve) hours as needed for nausea or vomiting.    Dispense:  12 suppository    Refill:  1     Subjective:   Chief Complaint: abdominal pain, regurgitation  HPI Craig Guzman is a 50 year old man with a history of gastroduodenal Crohn's disease status post resection history of dumping syndrome and many difficulties with nausea and vomiting and abdominal pain and diarrhea over the years.  He was last seen in June 2021 with assessment and plan as below.  . Gastroduodenal Crohn's disease without complication ([place]) Yes  . Bilateral lower abdominal pain    . Diarrhea - episodic IBS, dumping, IBD?    Marland Kitchen Chronic RUQ pain    . Class 3 severe obesity due to excess calories with serious comorbidity and body mass index (BMI) of 45.0 to 49.9 in adult [facility])        IBS versus IBD versus dumping syndrome as far as the symptoms are concerned.  Trial  of dicyclomine 20 mg before meals.  This right upper quadrant pain seems possibly neuropathic to me.  Prior work-up has been unrevealing.  We had a discussion about weight and diet and it could be more difficult in him particularly with these food sensitivities he describes, but I think trying a low-carb diet and intermittent fasting could be helpful to him in many ways.  Internet link for diet doctor provided the fasting method as well and I emailed him a video about intermittent fasting.  Regarding his symptoms, if the dicyclomine fails to help I would probably do a stool calprotectin test next.  Also bear in mind that he is on olmesartan though he has not had problems before that can lead to a celiac sprue-like issue     Had done ok but ion past few mos having epigastric fullness, pressure pc and then regurgitation or vomiting. Eating soups, soft foods. Meat not ground or sliced is a problem. Poultry or beef ok if ground or sliced. BM's generally ok but will have lower abd cramps preceding diarrhea. Hyoscyamine helps that.   Wt Readings from Last 3 Encounters:  10/20/20 (!) 352 lb (159.7 kg)  09/08/20 (!) 352 lb (159.7 kg)  04/02/20 (!) 347 lb 0.1 oz (157.4 kg)   He saw Dr. Nyoka Cowden in March and B12 level was 198 with oral supplementation recommended  with a plan to recheck levels. LFT's also abnormal. Wants refill of prochlorperazine Lab Results  Component Value Date   HGBA1C 5.8 (H) 09/09/2020   Lab Results  Component Value Date   CHOL 187 09/09/2020   HDL 34 (L) 09/09/2020   LDLCALC 130 (H) 09/09/2020   TRIG 126 09/09/2020   CHOLHDL 5.5 (H) 09/09/2020   Lab Results  Component Value Date   ALT 43 09/09/2020   AST 55 (H) 09/09/2020   ALKPHOS 167 (H) 09/09/2020   BILITOT 0.5 09/09/2020    Lab Results  Component Value Date   CREATININE 0.95 09/09/2020   BUN 10 09/09/2020   NA 139 09/09/2020   K 4.1 09/09/2020   CL 103 09/09/2020   CO2 18 (L) 09/09/2020      Allergies   Allergen Reactions  . Zithromax [Azithromycin] Hypertension  . Lisinopril Swelling    SWELLING REACTION UNSPECIFIED   . Tessalon Perles [Benzonatate]     hypertension   Current Meds  Medication Sig  . azelastine (OPTIVAR) 0.05 % ophthalmic solution Place 1 drop into both eyes 2 (two) times daily.  . clotrimazole (MYCELEX) 10 MG troche Take 1 tablet (10 mg total) by mouth 5 (five) times daily.  . cyclobenzaprine (FLEXERIL) 10 MG tablet Take 1 tablet (10 mg total) by mouth 3 (three) times daily as needed for muscle spasms.  Marland Kitchen doxazosin (CARDURA) 8 MG tablet Take 1 tablet (8 mg total) by mouth at bedtime.  . famotidine (PEPCID) 40 MG tablet Take 1 tablet (40 mg total) by mouth daily.  . hyoscyamine (ANASPAZ) 0.125 MG TBDP disintergrating tablet Place 1 tablet (0.125 mg total) under the tongue every 4 (four) hours as needed for cramping.  . olmesartan (BENICAR) 20 MG tablet Take 1 tablet (20 mg total) by mouth daily.  . ondansetron (ZOFRAN-ODT) 4 MG disintegrating tablet Take 1 tablet (4 mg total) by mouth every 8 (eight) hours as needed for nausea or vomiting.  . prochlorperazine (COMPAZINE) 25 MG suppository Place 1 suppository (25 mg total) rectally every 12 (twelve) hours as needed for nausea or vomiting.  . rizatriptan (MAXALT-MLT) 10 MG disintegrating tablet TAKE 1 TABLET 3 TIME A DA Y AS NEEDED FOR MIGRAINE  . traMADol (ULTRAM) 50 MG tablet Take 1 tablet (50 mg total) by mouth every 6 (six) hours as needed.  . verapamil (CALAN-SR) 180 MG CR tablet Take 2 tablets (360 mg total) by mouth daily.   Past Medical History:  Diagnosis Date  . Allergy    trees/ grasses/ animals/dust/mold  . Biliary dyskinesia   . Colloid thyroid nodule   . Crohn's disease ([place])    Stomach, terminal ileum, cecum  . Crohn's disease ([place])   . Dyspnea    due to nasal /sinus congestion  . Gastric ulcer    antral  . GERD (gastroesophageal reflux disease)   . History of kidney stones    total of 26 stones  in the past-due to vhron's disease  . History of migraine headaches   . HTN (hypertension)   . Kidney stone 09/20/2012  . Migraine   . Obesity   . Pneumonia 02/27/2014   ED  . Postsurgical dumping syndrome 02/14/2019  . Sleep apnea    has cpap- does not wear    Past Surgical History:  Procedure Laterality Date  . BALLOON DILATION N/A 12/06/2017   Procedure: BALLOON DILATION;  Surgeon: Gatha Mayer, MD;  Location: [facility];  Service: Endoscopy;  Laterality: N/A;  . BIOPSY  06/21/2018   Procedure: BIOPSY;  Surgeon: Gatha Mayer, MD;  Location: [facility];  Service: Endoscopy;;  . CHOLECYSTECTOMY  2011   [place]  . CIRCUMCISION N/A 12/09/2018   Procedure: CIRCUMCISION ADULT;  Surgeon: Lucas Mallow, MD;  Location: [facility];  Service: Urology;  Laterality: N/A;  . COLONOSCOPY  07/26/11   Crohn's colitis, ileitis  . ESOPHAGOGASTRODUODENOSCOPY  05/04/11, 07/26/11   granulomatous gastritis - Crohn's  . ESOPHAGOGASTRODUODENOSCOPY (EGD) WITH PROPOFOL N/A 03/28/2017   Procedure: ESOPHAGOGASTRODUODENOSCOPY (EGD) WITH PROPOFOL  with balloon dilation of duodenum;  Surgeon: [name], MD;  Location: [facility];  Service: Gastroenterology;  Laterality: N/A;  . ESOPHAGOGASTRODUODENOSCOPY (EGD) WITH PROPOFOL N/A 12/06/2017   Procedure: ESOPHAGOGASTRODUODENOSCOPY (EGD) WITH PROPOFOL;  Surgeon: Gatha Mayer, MD;  Location: [facility];  Service: Endoscopy;  Laterality: N/A;  . ESOPHAGOGASTRODUODENOSCOPY (EGD) WITH PROPOFOL N/A 06/21/2018   Procedure: ESOPHAGOGASTRODUODENOSCOPY (EGD) WITH PROPOFOL;  Surgeon: Gatha Mayer, MD;  Location: [facility];  Service: Endoscopy;  Laterality: N/A;  . ETHMOIDECTOMY N/A 11/27/2018   Procedure: ENDOSCOPIC TOTAL ETHMOIDECTOMY;  Surgeon: Leta Baptist, MD;  Location: [place];  Service: ENT;  Laterality: N/A;  . FLEXIBLE SIGMOIDOSCOPY    . GASTROJEJUNOSTOMY  01/2018  . MULTIPLE TOOTH EXTRACTIONS  2005  . NASAL SEPTOPLASTY W/ TURBINOPLASTY  Bilateral 11/27/2018   Procedure: NASAL SEPTOPLASTY WITH BILATERAL TURBINATE REDUCTION;  Surgeon: Leta Baptist, MD;  Location: [facility];  Service: ENT;  Laterality: Bilateral;  . SHOULDER SURGERY     right  . SINUS ENDO WITH FUSION N/A 11/27/2018   Procedure: SINUS ENDO WITH FUSION;  Surgeon: Leta Baptist, MD;  Location: [place];  Service: ENT;  Laterality: N/A;  . UPPER GASTROINTESTINAL ENDOSCOPY    . VASECTOMY     bilateral w/lysis of penile adhesions   Social History   Social History Narrative   Married. Education: [employer].    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   Review of Systems As above  Objective:   Physical Exam BP 114/64   Pulse 76   Ht 6' (1.829 m)   Wt (!) 352 lb (159.7 kg)   BMI 47.74 kg/m  Obese bm NAD Lungs cta Cor NL abd obese w/ mild epigastric tenderness

## 2020-10-22 ENCOUNTER — Other Ambulatory Visit: Payer: Self-pay

## 2020-10-22 MED ORDER — CYANOCOBALAMIN 1000 MCG/ML IJ SOLN: 1000.0000 ug | Freq: Once | INTRAMUSCULAR | 12 refills | Status: AC

## 2020-10-22 MED ORDER — VITAMIN D (ERGOCALCIFEROL) 1.25 MG (50000 UNIT) PO CAPS: 50000.0000 [IU] | ORAL_CAPSULE | ORAL | 0 refills | Status: DC

## 2020-10-24 LAB — VITAMIN B1: Vitamin B1 (Thiamine): 6 nmol/L — ABNORMAL LOW (ref 8–30)

## 2020-10-24 LAB — METHYLMALONIC ACID, SERUM: Methylmalonic Acid, Quant: 127 nmol/L (ref 87–318)

## 2020-10-27 DIAGNOSIS — I700 Atherosclerosis of aorta: Secondary | ICD-10-CM | POA: Insufficient documentation

## 2020-10-27 DIAGNOSIS — Z0000 Encounter for general adult medical examination without abnormal findings: Secondary | ICD-10-CM | POA: Insufficient documentation

## 2020-10-27 DIAGNOSIS — Z7189 Other specified counseling: Secondary | ICD-10-CM | POA: Insufficient documentation

## 2020-10-27 DIAGNOSIS — E785 Hyperlipidemia, unspecified: Secondary | ICD-10-CM | POA: Insufficient documentation

## 2020-10-27 DIAGNOSIS — R7303 Prediabetes: Secondary | ICD-10-CM | POA: Insufficient documentation

## 2020-10-27 DIAGNOSIS — I5030 Unspecified diastolic (congestive) heart failure: Secondary | ICD-10-CM | POA: Insufficient documentation

## 2020-11-01 ENCOUNTER — Ambulatory Visit (AMBULATORY_SURGERY_CENTER): Payer: Medicare (Managed Care) | Admitting: Internal Medicine

## 2020-11-01 ENCOUNTER — Other Ambulatory Visit: Payer: Self-pay

## 2020-11-01 ENCOUNTER — Encounter: Payer: Self-pay | Admitting: Internal Medicine

## 2020-11-01 VITALS — BP 142/81 | HR 71 | Temp 97.6°F | Resp 14 | Ht 72.0 in | Wt 352.0 lb

## 2020-11-01 DIAGNOSIS — K2960 Other gastritis without bleeding: Secondary | ICD-10-CM

## 2020-11-01 DIAGNOSIS — K50018 Crohn's disease of small intestine with other complication: Secondary | ICD-10-CM | POA: Diagnosis present

## 2020-11-01 DIAGNOSIS — K529 Noninfective gastroenteritis and colitis, unspecified: Secondary | ICD-10-CM

## 2020-11-01 DIAGNOSIS — K2950 Unspecified chronic gastritis without bleeding: Secondary | ICD-10-CM | POA: Diagnosis not present

## 2020-11-01 MED ORDER — SODIUM CHLORIDE 0.9 % IV SOLN: 500.0000 mL | Freq: Once | INTRAVENOUS | Status: DC

## 2020-11-01 NOTE — Progress Notes (Signed)
Pt's states no medical or surgical changes since previsit or office visit. 

## 2020-11-01 NOTE — Patient Instructions (Addendum)
Some mild inflammation where the stomach and intestine join - seems similar to what we have seen before.  I took biopsies and will let you know what I recommend after that.  I have put thiamine on your med list today - that is over the counter - your level was a bit low. Please start taking that every day. I suspect that is a result of combination of surgery, limited diet and possibly malabsorption from disease elsewhere?  Will contact you next week most likely.  I appreciate the opportunity to care for you. Gatha Mayer, MD, FACG   YOU HAD AN ENDOSCOPIC PROCEDURE TODAY AT [facility]:   Refer to the procedure report that was given to you for any specific questions about what was found during the examination.  If the procedure report does not answer your questions, please call your gastroenterologist to clarify.  If you requested that your care partner not be given the details of your procedure findings, then the procedure report has been included in a sealed envelope for you to review at your convenience later.  YOU SHOULD EXPECT: Some feelings of bloating in the abdomen. Passage of more gas than usual.  Walking can help get rid of the air that was put into your GI tract during the procedure and reduce the bloating. If you had a lower endoscopy (such as a colonoscopy or flexible sigmoidoscopy) you may notice spotting of blood in your stool or on the toilet paper. If you underwent a bowel prep for your procedure, you may not have a normal bowel movement for a few days.  Please Note:  You might notice some irritation and congestion in your nose or some drainage.  This is from the oxygen used during your procedure.  There is no need for concern and it should clear up in a day or so.  SYMPTOMS TO REPORT IMMEDIATELY:   Following upper endoscopy (EGD)  Vomiting of blood or coffee ground material  New chest pain or pain under the shoulder blades  Painful or persistently  difficult swallowing  New shortness of breath  Fever of 100F or higher  Black, tarry-looking stools  For urgent or emergent issues, a gastroenterologist can be reached at any hour by calling [phone]. Do not use MyChart messaging for urgent concerns.    DIET:  We do recommend a small meal at first, but then you may proceed to your regular diet.  Drink plenty of fluids but you should avoid alcoholic beverages for 24 hours.  ACTIVITY:  You should plan to take it easy for the rest of today and you should NOT DRIVE or use heavy machinery until tomorrow (because of the sedation medicines used during the test).    FOLLOW UP: Our staff will call the number listed on your records 48-72 hours following your procedure to check on you and address any questions or concerns that you may have regarding the information given to you following your procedure. If we do not reach you, we will leave a message.  We will attempt to reach you two times.  During this call, we will ask if you have developed any symptoms of COVID 19. If you develop any symptoms (ie: fever, flu-like symptoms, shortness of breath, cough etc.) before then, please call [phone].  If you test positive for Covid 19 in the 2 weeks post procedure, please call and report this information to [place].    If any biopsies were taken you will be  contacted by phone or by letter within the next 1-3 weeks.  Please call us at [phone] if you have not heard about the biopsies in 3 weeks.    SIGNATURES/CONFIDENTIALITY: You and/or your care partner have signed paperwork which will be entered into your electronic medical record.  These signatures attest to the fact that that the information above on your After Visit Summary has been reviewed and is understood.  Full responsibility of the confidentiality of this discharge information lies with you and/or your care-partner.

## 2020-11-01 NOTE — Progress Notes (Signed)
Called to room to assist during endoscopic procedure.  Patient ID and intended procedure confirmed with present staff. Received instructions for my participation in the procedure from the performing physician.  

## 2020-11-01 NOTE — Op Note (Signed)
[place] Patient Name: Craig Guzman Procedure Date: 11/01/2020 7:23 AM MRN: [id number] Endoscopist: Gatha Mayer , MD Age: 50 Referring MD:  Date of Birth: 09/07/1970 Gender: Male Account #: [id number] Procedure:                Upper GI endoscopy Indications:              Crohn's disease Medicines:                Propofol per Anesthesia, Monitored Anesthesia Care Procedure:                Pre-Anesthesia Assessment:                           - Prior to the procedure, a History and Physical                            was performed, and patient medications and                            allergies were reviewed. The patient's tolerance of                            previous anesthesia was also reviewed. The risks                            and benefits of the procedure and the sedation                            options and risks were discussed with the patient.                            All questions were answered, and informed consent                            was obtained. Prior Anticoagulants: The patient has                            taken no previous anticoagulant or antiplatelet                            agents. ASA Grade Assessment: III - A patient with                            severe systemic disease. After reviewing the risks                            and benefits, the patient was deemed in                            satisfactory condition to undergo the procedure.                           After obtaining informed consent, the endoscope was  passed under direct vision. Throughout the                            procedure, the patient's blood pressure, pulse, and                            oxygen saturations were monitored continuously. The                            Endoscope was introduced through the mouth, and                            advanced to the afferent and efferent jejunal                            loops. The upper GI  endoscopy was accomplished                            without difficulty. The patient tolerated the                            procedure fairly well. Scope In: Scope Out: Findings:                 Evidence of a gastrojejunostomy was found in the                            gastric body. This was characterized by erosion,                            erythema, friable mucosa and inflammation. Biopsies                            were taken with a cold forceps for histology.                            Verification of patient identification for the                            specimen was done. Estimated blood loss was minimal.                           The examined jejunum was normal. This was biopsied                            with a cold forceps for histology. Verification of                            patient identification for the specimen was done.                            Estimated blood loss was minimal.                           The  cardia, gastric fundus and gastric body were                            normal. Biopsies were taken with a cold forceps for                            histology. Verification of patient identification                            for the specimen was done. Estimated blood loss was                            minimal.                           The examined esophagus was normal.                           The cardia and gastric fundus were normal on                            retroflexion. Complications:            No immediate complications. Estimated Blood Loss:     Estimated blood loss was minimal. Impression:               - A gastrojejunostomy was found, characterized by                            friable mucosa, inflammation, erosion and erythema.                            Biopsied.                           - Normal examined jejunum. Biopsied.                           - Normal cardia, gastric fundus and gastric body.                             Biopsied.                           - Normal esophagus. Recommendation:           - Patient has a contact number available for                            emergencies. The signs and symptoms of potential                            delayed complications were discussed with the                            patient. Return to normal activities tomorrow.  Written discharge instructions were provided to the                            patient.                           - Resume previous diet.                           - Continue present medications.                           - Await pathology results.                           - Start OTC oral thiamine for low thiamine level. Gatha Mayer, MD 11/01/2020 7:59:37 AM This report has been signed electronically.

## 2020-11-01 NOTE — Progress Notes (Signed)
PT taken to PACU. Monitors in place. VSS. Report given to RN.   Pt reported nausea during emergence. 57m IV zofran given.

## 2020-11-03 ENCOUNTER — Telehealth: Payer: Self-pay

## 2020-11-03 NOTE — Telephone Encounter (Signed)
  Follow up Call-  Call back number 11/01/2020  Post procedure Call Back phone  # [phone]  Permission to leave phone message Yes  Some recent data might be hidden     Patient questions:  Do you have a fever, pain , or abdominal swelling? No. Pain Score  0 *  Have you tolerated food without any problems? Yes.    Have you been able to return to your normal activities? Yes.    Do you have any questions about your discharge instructions: Diet   No. Medications  No. Follow up visit  No.  Do you have questions or concerns about your Care? No.  Actions: * If pain score is 4 or above: No action needed, pain <4.

## 2020-11-05 DIAGNOSIS — J3489 Other specified disorders of nose and nasal sinuses: Secondary | ICD-10-CM | POA: Insufficient documentation

## 2020-11-16 ENCOUNTER — Ambulatory Visit: Payer: Medicare (Managed Care) | Admitting: Gastroenterology

## 2020-11-23 ENCOUNTER — Other Ambulatory Visit: Payer: Self-pay | Admitting: Family Medicine

## 2020-11-23 DIAGNOSIS — R351 Nocturia: Secondary | ICD-10-CM

## 2020-11-23 DIAGNOSIS — I10 Essential (primary) hypertension: Secondary | ICD-10-CM

## 2020-12-10 ENCOUNTER — Telehealth: Payer: Self-pay | Admitting: Internal Medicine

## 2020-12-10 ENCOUNTER — Telehealth: Payer: Self-pay

## 2020-12-10 MED ORDER — ONDANSETRON 4 MG PO TBDP: 4.0000 mg | ORAL_TABLET | Freq: Three times a day (TID) | ORAL | 2 refills | Status: DC | PRN

## 2020-12-10 NOTE — Telephone Encounter (Signed)
I have started a prior authorization for Craig Guzman's ondansetron 31m ODT tablets on [employer]. Will check back for outcome.

## 2020-12-10 NOTE — Telephone Encounter (Signed)
Patients wife called and states her husband is having a lot of discomfort and crapping when he eats. Is seeking advise for him to feel better.

## 2020-12-10 NOTE — Telephone Encounter (Signed)
Patient has not been taking his Pepcid.  He will begin the pepcid and try this for a week or two.  If no improvement call back and we will get him in with [name]

## 2020-12-13 NOTE — Telephone Encounter (Signed)
It has been denied. I called [facility] and he and his wife are going to see if with [facility] they can get it cheaper. If not he will call me back. When I called [facility] earlier they wouldn't tell me the cash price or the [place] price.

## 2021-01-04 ENCOUNTER — Other Ambulatory Visit: Payer: Self-pay | Admitting: Family Medicine

## 2021-01-04 DIAGNOSIS — I10 Essential (primary) hypertension: Secondary | ICD-10-CM

## 2021-01-04 DIAGNOSIS — R351 Nocturia: Secondary | ICD-10-CM

## 2021-01-22 ENCOUNTER — Other Ambulatory Visit: Payer: Self-pay | Admitting: Family Medicine

## 2021-01-22 DIAGNOSIS — R351 Nocturia: Secondary | ICD-10-CM

## 2021-01-22 DIAGNOSIS — I10 Essential (primary) hypertension: Secondary | ICD-10-CM

## 2021-01-31 DIAGNOSIS — U071 COVID-19: Secondary | ICD-10-CM

## 2021-01-31 HISTORY — DX: COVID-19: U07.1

## 2021-02-07 ENCOUNTER — Ambulatory Visit (HOSPITAL_COMMUNITY)
Admission: EM | Admit: 2021-02-07 | Discharge: 2021-02-07 | Disposition: A | Discharge status: Home / Self Care | Payer: Medicare (Managed Care) | Attending: Family Medicine | Admitting: Family Medicine

## 2021-02-07 ENCOUNTER — Encounter (HOSPITAL_COMMUNITY): Payer: Self-pay

## 2021-02-07 ENCOUNTER — Other Ambulatory Visit: Payer: Self-pay

## 2021-02-07 DIAGNOSIS — R030 Elevated blood-pressure reading, without diagnosis of hypertension: Secondary | ICD-10-CM

## 2021-02-07 DIAGNOSIS — U071 COVID-19: Secondary | ICD-10-CM

## 2021-02-07 DIAGNOSIS — J209 Acute bronchitis, unspecified: Secondary | ICD-10-CM

## 2021-02-07 MED ORDER — ALBUTEROL SULFATE HFA 108 (90 BASE) MCG/ACT IN AERS: 1.0000 | INHALATION_SPRAY | Freq: Four times a day (QID) | RESPIRATORY_TRACT | 0 refills | Status: DC | PRN

## 2021-02-07 MED ORDER — PREDNISONE 20 MG PO TABS: 40.0000 mg | ORAL_TABLET | Freq: Every day | ORAL | 0 refills | Status: DC

## 2021-02-07 MED ORDER — PROMETHAZINE-DM 6.25-15 MG/5ML PO SYRP: 5.0000 mL | ORAL_SOLUTION | Freq: Four times a day (QID) | ORAL | 0 refills | Status: DC | PRN

## 2021-02-07 NOTE — ED Notes (Signed)
BP 184/117. Pt states he has not taken BP medicine. States he vomits when he takes it

## 2021-02-07 NOTE — ED Triage Notes (Signed)
Pt presents with c/o a cough.   States he has not been able to sleep. States he tested positive for COVID last week and has not been able to get rid of the cough.

## 2021-02-07 NOTE — ED Provider Notes (Signed)
[place]    CSN: [id number] Arrival date & time: 02/07/21  1022      History   Chief Complaint No chief complaint on file.   HPI Craig Bourke. is a 50 y.o. male.   Presenting today with 1 week history of cough, congestion, sore swollen throat, fatigue, fever, chills, body aches.  States that the fever broke about 2 days and and has not returned.  Tested positive for COVID both on home test and test at the drugstore.  He is overall feeling better, but still having a hacking, persistent tickling cough.  He states the cough is keeping him up all night and causing him to vomit at times.  He states he is prone to getting bronchitis anytime he gets sick.  Has a history of seasonal allergies, pneumonia, sleep apnea, obesity.  Has been taking Coricidin HBP, using sinus rinses without any relief.  Denies chest pain, shortness of breath, wheezing, recent fevers the past few days.   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 03/21/2019   Postsurgical dumping syndrome 02/14/2019   S/P functional endoscopic sinus surgery 11/27/2018   Abdominal pain, epigastric    Angioedema 04/19/2018   Intractable vomiting with nausea    Cigarette nicotine dependence without complication 63/84/5364   Shifting  sleep-work schedule, affecting sleep 05/15/2016   Sleep deprivation 05/15/2016   Sleep related hypoventilation in conditions classified elsewhere 05/15/2016   Sleep related headaches 05/15/2016   Morbid obesity ([place]) 09/19/2013   Diarrhea 07/10/2013   Colloid thyroid nodule 04/04/2013   Hypertension 11/26/2012   Gastroparesis??? 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[place]) 05/17/2011   GERD (gastroesophageal reflux disease) 04/21/2011    Past Surgical History:  Procedure Laterality Date   BALLOON DILATION N/A 12/06/2017   Procedure: BALLOON DILATION;  Surgeon: Gatha Mayer, MD;  Location: [facility];  Service: Endoscopy;  Laterality: N/A;   BIOPSY  06/21/2018   Procedure: BIOPSY;  Surgeon: Gatha Mayer, MD;  Location: [facility];  Service: Endoscopy;;   CHOLECYSTECTOMY  2011   [place]   CIRCUMCISION N/A 12/09/2018   Procedure: CIRCUMCISION ADULT;  Surgeon: Lucas Mallow, MD;  Location: [facility];  Service: Urology;  Laterality: N/A;   COLONOSCOPY  07/26/11   Crohn's colitis, ileitis   ESOPHAGOGASTRODUODENOSCOPY  05/04/11, 07/26/11   granulomatous gastritis - Crohn's   ESOPHAGOGASTRODUODENOSCOPY (EGD) WITH PROPOFOL N/A 03/28/2017   Procedure: ESOPHAGOGASTRODUODENOSCOPY (EGD) WITH PROPOFOL  with balloon dilation of duodenum;  Surgeon: [name], MD;  Location: [facility];  Service: Gastroenterology;  Laterality: N/A;   ESOPHAGOGASTRODUODENOSCOPY (EGD) WITH PROPOFOL N/A 12/06/2017   Procedure: ESOPHAGOGASTRODUODENOSCOPY (EGD) WITH PROPOFOL;  Surgeon: Gatha Mayer, MD;  Location: [facility];  Service: Endoscopy;  Laterality: N/A;   ESOPHAGOGASTRODUODENOSCOPY (EGD) WITH PROPOFOL N/A 06/21/2018   Procedure: ESOPHAGOGASTRODUODENOSCOPY (EGD) WITH PROPOFOL;  Surgeon: Gatha Mayer, MD;  Location: [facility];  Service: Endoscopy;  Laterality: N/A;    ETHMOIDECTOMY N/A 11/27/2018   Procedure: ENDOSCOPIC TOTAL ETHMOIDECTOMY;  Surgeon: Leta Baptist, MD;  Location: [facility];  Service: ENT;  Laterality: N/A;   FLEXIBLE SIGMOIDOSCOPY     GASTROJEJUNOSTOMY  01/2018   MULTIPLE TOOTH EXTRACTIONS  2005   NASAL SEPTOPLASTY W/ TURBINOPLASTY Bilateral 11/27/2018   Procedure: NASAL SEPTOPLASTY WITH BILATERAL TURBINATE REDUCTION;  Surgeon: Leta Baptist, MD;  Location: [facility];  Service: ENT;  Laterality: Bilateral;   SHOULDER SURGERY     right   SINUS ENDO WITH FUSION N/A 11/27/2018   Procedure: SINUS ENDO WITH FUSION;  Surgeon: Leta Baptist, MD;  Location: [facility];  Service: ENT;  Laterality: N/A;   UPPER GASTROINTESTINAL ENDOSCOPY     VASECTOMY     bilateral w/lysis of penile adhesions       Home Medications    Prior to Admission medications   Medication Sig Start Date End Date Taking? Authorizing Provider  albuterol (VENTOLIN HFA) 108 (90 Base) MCG/ACT inhaler Inhale 1-2 puffs into the lungs every 6 (six) hours as needed for wheezing or shortness of breath. 02/07/21  Yes Volney American, PA-C  predniSONE (DELTASONE) 20 MG tablet Take 2 tablets (40 mg total) by mouth daily with breakfast. 02/07/21  Yes Volney American, PA-C  promethazine-dextromethorphan (PROMETHAZINE-DM) 6.25-15 MG/5ML syrup Take 5 mLs by mouth 4 (four) times daily as needed for cough. 02/07/21  Yes Volney American, PA-C  azelastine (OPTIVAR) 0.05 % ophthalmic solution Place 1 drop into both eyes 2 (two) times daily. 09/08/20   [name], MD  clotrimazole (MYCELEX) 10 MG troche Take 1 tablet (10 mg total) by mouth 5 (five) times daily. 09/08/20   [name], MD  cyclobenzaprine (FLEXERIL) 10 MG tablet Take 1 tablet (10 mg total) by mouth 3 (three) times daily as needed for muscle spasms. 04/02/20   Raylene Everts, MD  doxazosin (CARDURA) 8 MG tablet Take 1 tablet (8 mg total) by mouth at bedtime. 09/08/20   [name], MD  famotidine (PEPCID) 40 MG tablet Take 1  tablet (40 mg total) by mouth daily. 03/05/20   Gatha Mayer, MD  hyoscyamine (ANASPAZ) 0.125 MG TBDP disintergrating tablet Place 1 tablet (0.125 mg total) under the tongue every 4 (four) hours as needed for cramping. 03/05/20   Gatha Mayer, MD  olmesartan (BENICAR) 20 MG tablet Take 1 tablet (20 mg total) by mouth daily. 09/08/20   [name], MD  ondansetron (ZOFRAN-ODT) 4 MG disintegrating tablet Take 1 tablet (4 mg total) by mouth every 8 (eight) hours as needed for nausea or vomiting. 12/10/20   Gatha Mayer, MD  prochlorperazine (COMPAZINE) 25 MG suppository Place 1 suppository (25 mg total) rectally every 12 (twelve) hours as needed for nausea or vomiting. 10/20/20   Gatha Mayer, MD  rizatriptan (MAXALT-MLT) 10 MG disintegrating tablet TAKE 1 TABLET 3 TIME A DA Y AS NEEDED FOR MIGRAINE 01/26/20   Gatha Mayer, MD  traMADol (ULTRAM) 50 MG tablet Take 1 tablet (50 mg total) by mouth every 6 (six) hours as  needed. 04/02/20   Raylene Everts, MD  verapamil (CALAN-SR) 180 MG CR tablet Take 2 tablets (360 mg total) by mouth daily. 09/08/20   [name], MD  Vitamin D, Ergocalciferol, (DRISDOL) 1.25 MG (50000 UNIT) CAPS capsule Take 1 capsule (50,000 Units total) by mouth every 7 (seven) days. 10/22/20   Gatha Mayer, MD    Family History Family History  Problem Relation Age of Onset   Colon polyps Father    Diabetes Father    Hypertension Father    Hypertension Mother    Asthma Mother    Heart disease Maternal Grandmother    Colon cancer Neg Hx    Rectal cancer Neg Hx    Stomach cancer Neg Hx    Esophageal cancer Neg Hx     Social History Social History   Tobacco Use   Smoking status: Former    Packs/day: 0.75    Years: 21.00    Pack years: 15.75    Types: Cigarettes    Quit date: 12/01/2017    Years since quitting: 3.1   Smokeless tobacco: Former    Types: Snuff   Tobacco comments:    Counseling sheet given in exam room   Vaping Use   Vaping Use:  Never used  Substance Use Topics   Alcohol use: Not Currently   Drug use: No     Allergies   Zithromax [azithromycin], Lisinopril, and Tessalon perles [benzonatate]   Review of Systems Review of Systems Per HPI  Physical Exam Triage Vital Signs ED Triage Vitals  Enc Vitals Group     BP 02/07/21 1244 (!) 184/117     Pulse Rate 02/07/21 1244 80     Resp 02/07/21 1244 17     Temp 02/07/21 1244 98.6 F (37 C)     Temp Source 02/07/21 1244 Oral     SpO2 02/07/21 1244 93 %     Weight --      Height --      Head Circumference --      Peak Flow --      Pain Score 02/07/21 1243 0     Pain Loc --      Pain Edu? --      Excl. in [place]? --    No data found.  Updated Vital Signs BP (!) 184/117 (BP Location: Right Arm) Comment: Pt states he has not taken BP med.  Pulse 80   Temp 98.6 F (37 C) (Oral)   Resp 17   SpO2 98%   Visual Acuity Right Eye Distance:   Left Eye Distance:   Bilateral Distance:    Right Eye Near:   Left Eye Near:    Bilateral Near:     Physical Exam Vitals and nursing note reviewed.  Constitutional:      Appearance: Normal appearance.  HENT:     Head: Atraumatic.     Right Ear: Tympanic membrane normal.     Left Ear: Tympanic membrane normal.     Nose: Congestion present.     Mouth/Throat:     Mouth: Mucous membranes are moist.     Pharynx: Posterior oropharyngeal erythema present.  Eyes:     Extraocular Movements: Extraocular movements intact.     Conjunctiva/sclera: Conjunctivae normal.  Cardiovascular:     Rate and Rhythm: Normal rate and regular rhythm.     Heart sounds: Normal heart sounds.  Pulmonary:     Effort: Pulmonary effort is normal. No respiratory distress.  Breath sounds: Normal breath sounds. No wheezing or rales.     Comments: Breathing comfortably on room air, speaking in full sentences without issue Musculoskeletal:        General: Normal range of motion.     Cervical back: Normal range of motion and neck supple.   Skin:    General: Skin is warm and dry.  Neurological:     General: No focal deficit present.     Mental Status: He is oriented to person, place, and time.  Psychiatric:        Mood and Affect: Mood normal.        Thought Content: Thought content normal.        Judgment: Judgment normal.     UC Treatments / Results  Labs (all labs ordered are listed, but only abnormal results are displayed) Labs Reviewed - No data to display  EKG   Radiology No results found.  Procedures Procedures (including critical care time)  Medications Ordered in UC Medications - No data to display  Initial Impression / Assessment and Plan / UC Course  I have reviewed the triage vital signs and the nursing notes.  Pertinent labs & imaging results that were available during my care of the patient were reviewed by me and considered in my medical decision making (see chart for details).     Post COVID bronchitis, no evidence of pneumonia at this time.  Lungs clear to auscultation bilaterally, oxygen saturation, heart rate, respirations and temperature all within normal limits.  We will treat with prednisone, Phenergan DM, albuterol inhaler.  He states that this combination usually helps tremendously with his symptoms when he gets bronchitis.  His blood pressure is significantly elevated today which is because he did not take his blood pressure medication as he was coughing too much.  He will take it when he gets home and continue to monitor his readings closely.  Follow-up with PCP if readings not improving.  Final Clinical Impressions(s) / UC Diagnoses   Final diagnoses:  COVID-19  Acute bronchitis, unspecified organism  Elevated blood pressure reading   Discharge Instructions   None    ED Prescriptions     Medication Sig Dispense Auth. Provider   predniSONE (DELTASONE) 20 MG tablet Take 2 tablets (40 mg total) by mouth daily with breakfast. 10 tablet Volney American, PA-C    promethazine-dextromethorphan (PROMETHAZINE-DM) 6.25-15 MG/5ML syrup Take 5 mLs by mouth 4 (four) times daily as needed for cough. 100 mL Volney American, PA-C   albuterol (VENTOLIN HFA) 108 (90 Base) MCG/ACT inhaler Inhale 1-2 puffs into the lungs every 6 (six) hours as needed for wheezing or shortness of breath. 18 g Volney American, [place]      PDMP not reviewed this encounter.   Volney American, [place] 02/07/21 1348

## 2021-02-17 ENCOUNTER — Other Ambulatory Visit: Payer: Self-pay | Admitting: Family Medicine

## 2021-02-17 DIAGNOSIS — R351 Nocturia: Secondary | ICD-10-CM

## 2021-02-17 DIAGNOSIS — I10 Essential (primary) hypertension: Secondary | ICD-10-CM

## 2021-03-01 ENCOUNTER — Other Ambulatory Visit: Payer: Self-pay | Admitting: Family Medicine

## 2021-03-01 NOTE — Telephone Encounter (Signed)
Albuterol refilled, but please check status.  How often is he using this medication? Is he still under me as primary care provider?  If frequent use needed, recommend visit in the next 2 days.

## 2021-03-01 NOTE — Telephone Encounter (Signed)
Filled by another provider on 02/17/21. Pease advise if ok to refill.

## 2021-03-01 NOTE — Telephone Encounter (Signed)
Called patient to see how frequently he was having to use the albuterol inhaler. Patient states he has not had to use it in a while. He states he is doing/feeling much better. He states he had covid and also developed bronchitis. Patient scheduled an appointment for 9/28 with his provider. I tried to see if I could contact the provider to get an earlier appointment but patient states that appointment is fine with him since he has no complaints.

## 2021-03-17 ENCOUNTER — Other Ambulatory Visit: Payer: Self-pay | Admitting: Family Medicine

## 2021-03-17 DIAGNOSIS — I10 Essential (primary) hypertension: Secondary | ICD-10-CM

## 2021-03-17 DIAGNOSIS — R351 Nocturia: Secondary | ICD-10-CM

## 2021-03-30 ENCOUNTER — Encounter: Payer: Self-pay | Admitting: Family Medicine

## 2021-03-30 ENCOUNTER — Ambulatory Visit (INDEPENDENT_AMBULATORY_CARE_PROVIDER_SITE_OTHER): Payer: Medicare (Managed Care) | Admitting: Family Medicine

## 2021-03-30 ENCOUNTER — Other Ambulatory Visit: Payer: Self-pay

## 2021-03-30 VITALS — BP 128/84 | HR 89 | Temp 98.2°F | Resp 16 | Ht 72.0 in | Wt 366.6 lb

## 2021-03-30 DIAGNOSIS — J40 Bronchitis, not specified as acute or chronic: Secondary | ICD-10-CM

## 2021-03-30 DIAGNOSIS — I10 Essential (primary) hypertension: Secondary | ICD-10-CM | POA: Diagnosis not present

## 2021-03-30 DIAGNOSIS — E785 Hyperlipidemia, unspecified: Secondary | ICD-10-CM | POA: Diagnosis not present

## 2021-03-30 DIAGNOSIS — Z23 Encounter for immunization: Secondary | ICD-10-CM | POA: Diagnosis not present

## 2021-03-30 DIAGNOSIS — U071 COVID-19: Secondary | ICD-10-CM | POA: Diagnosis not present

## 2021-03-30 DIAGNOSIS — R7303 Prediabetes: Secondary | ICD-10-CM | POA: Diagnosis not present

## 2021-03-30 DIAGNOSIS — R351 Nocturia: Secondary | ICD-10-CM

## 2021-03-30 LAB — COMPREHENSIVE METABOLIC PANEL
ALT: 32 U/L (ref 0–53)
AST: 35 U/L (ref 0–37)
Albumin: 3.7 g/dL (ref 3.5–5.2)
Alkaline Phosphatase: 112 U/L (ref 39–117)
BUN: 9 mg/dL (ref 6–23)
CO2: 24 mEq/L (ref 19–32)
Calcium: 8.5 mg/dL (ref 8.4–10.5)
Chloride: 105 mEq/L (ref 96–112)
Creatinine, Ser: 0.84 mg/dL (ref 0.40–1.50)
GFR: 102.6 mL/min (ref >60.00)
Glucose, Bld: 98 mg/dL (ref 70–99)
Potassium: 4 mEq/L (ref 3.5–5.1)
Sodium: 138 mEq/L (ref 135–145)
Total Bilirubin: 0.4 mg/dL (ref 0.2–1.2)
Total Protein: 7.3 g/dL (ref 6.0–8.3)

## 2021-03-30 LAB — LIPID PANEL
Cholesterol: 163 mg/dL (ref 0–200)
HDL: 32.6 mg/dL — ABNORMAL LOW (ref >39.00)
LDL Cholesterol: 107 mg/dL — ABNORMAL HIGH (ref 0–99)
NonHDL: 129.92
Total CHOL/HDL Ratio: 5
Triglycerides: 114 mg/dL (ref 0.0–149.0)
VLDL: 22.8 mg/dL (ref 0.0–40.0)

## 2021-03-30 LAB — HEMOGLOBIN A1C: Hgb A1c MFr Bld: 6.2 % (ref 4.6–6.5)

## 2021-03-30 MED ORDER — VERAPAMIL HCL ER 180 MG PO TBCR: 360.0000 mg | EXTENDED_RELEASE_TABLET | Freq: Every day | ORAL | 1 refills | Status: DC

## 2021-03-30 MED ORDER — OLMESARTAN MEDOXOMIL 20 MG PO TABS: 20.0000 mg | ORAL_TABLET | Freq: Every day | ORAL | 1 refills | Status: DC

## 2021-03-30 MED ORDER — DOXAZOSIN MESYLATE 8 MG PO TABS: 8.0000 mg | ORAL_TABLET | Freq: Every day | ORAL | 1 refills | Status: DC

## 2021-03-30 NOTE — Patient Instructions (Addendum)
[employer] or other sparkling water may be a better substitute than soda.  I will check some labs today including a prediabetes test.  Keep up the good work with trying to increase activity and weight loss.  Continue to try to avoid fast food.  I will also check your cholesterol levels and other electrolytes and let you know if any changes are needed.  I am glad to hear that the bronchitis and COVID symptoms have improved.  Follow-up if any worsening symptoms.  As we discussed I would still recommend a COVID-19 bivalent booster but if you want to wait for a month to 6 weeks to have that done that is reasonable.  You can have that performed at any point in time though and can have that done sooner.  Return to the clinic or go to the nearest emergency room if any of your symptoms worsen or new symptoms occur.

## 2021-03-30 NOTE — Progress Notes (Signed)
Subjective:  Patient ID: Craig Clark., male    DOB: 1971/05/14  Age: 50 y.o. MRN: [id number]  CC:  Chief Complaint  Patient presents with   Follow-up    Pt here for follow up from ED in August had COVID and then Bronchitis in August and is still having some Fatigue but otherwise better since then.     HPI Craig Guzman. presents for   COVID-19 infection with bronchitis; Urgent care note reviewed from 02/07/2021.  1 week history of cough, congestion, fever.  Positive on home COVID testing as well as at the drugstore.  He was improving when he was seen at the August 8 visit but had had a persistent cough, including affecting sleep and posttussive emesis.  Reassuring exam as well as vitals and oxygen saturation.  Treated for bronchitis with prednisone, Phenergan DM, albuterol inhaler. Improving since ER visit, still some fatigue, but improving. Has personal trainer. No new shortness of breath, no recent need for albuterol. No recent fever.   Hypertension: Elevated readings at urgent care visit but he was coughing at that time.  Treated with doxazosin, olmesartan, verapamil. No new side effects.  Home readings: 120-130/80-90.  BP Readings from Last 3 Encounters:  03/30/21 128/84  02/07/21 (!) 184/117  11/01/20 (!) 142/81   Lab Results  Component Value Date   CREATININE 0.95 09/09/2020   Prediabetes: Some weight gain, recent prednisone use as above.  Due for repeat testing.  Previously did discuss avoiding fast food and cutting back on soda intake. Working with Physiological scientist.  Has cut back on dark sodas - drinking sprite to help with belching.  Fast food - rare.  Lab Results  Component Value Date   HGBA1C 5.8 (H) 09/09/2020   Wt Readings from Last 3 Encounters:  03/30/21 (!) 366 lb 9.6 oz (166.3 kg)  11/01/20 (!) 352 lb (159.7 kg)  10/20/20 (!) 352 lb (159.7 kg)   Hyperlipidemia: No current statin, borderline ASCVD risk for previously, has had elevated LFTs.  Last  tested in March.  Lifestyle modifications have been discussed previously as above. Lab Results  Component Value Date   CHOL 187 09/09/2020   HDL 34 (L) 09/09/2020   LDLCALC 130 (H) 09/09/2020   TRIG 126 09/09/2020   CHOLHDL 5.5 (H) 09/09/2020   Lab Results  Component Value Date   ALT 44 10/20/2020   AST 49 (H) 10/20/2020   ALKPHOS 134 (H) 10/20/2020   BILITOT 0.5 10/20/2020       History Patient Active Problem List   Diagnosis Date Noted   Abdominal migraine, not intractable ? 03/21/2019   Postsurgical dumping syndrome 02/14/2019   S/P functional endoscopic sinus surgery 11/27/2018   Abdominal pain, epigastric    Angioedema 04/19/2018   Intractable vomiting with nausea    Cigarette nicotine dependence without complication 37/04/6268   Shifting sleep-work schedule, affecting sleep 05/15/2016   Sleep deprivation 05/15/2016   Sleep related hypoventilation in conditions classified elsewhere 05/15/2016   Sleep related headaches 05/15/2016   Morbid obesity ([place]) 09/19/2013   Diarrhea 07/10/2013   Colloid thyroid nodule 04/04/2013   Hypertension 11/26/2012   Gastroparesis??? 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[place]) 05/17/2011   GERD (gastroesophageal reflux disease) 04/21/2011   Past Medical History:  Diagnosis Date   Allergy    trees/ grasses/ animals/dust/mold   Biliary dyskinesia    Colloid thyroid nodule  Crohn's disease ([place])    Stomach, terminal ileum, cecum   Crohn's disease ([place])    Dyspnea    due to nasal /sinus congestion   Gastric ulcer    antral   GERD (gastroesophageal reflux disease)    History of kidney stones    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   History of migraine headaches    HTN (hypertension)    Kidney stone 09/20/2012   Migraine    Obesity    Pneumonia 02/27/2014   ED   Postsurgical dumping syndrome  02/14/2019   Sleep apnea    has cpap- does not wear    Past Surgical History:  Procedure Laterality Date   BALLOON DILATION N/A 12/06/2017   Procedure: BALLOON DILATION;  Surgeon: Gatha Mayer, MD;  [facility];  Service: Endoscopy;  Laterality: N/A;   BIOPSY  06/21/2018   Procedure: BIOPSY;  Surgeon: Gatha Mayer, MD;  Location: [facility];  Service: Endoscopy;;   CHOLECYSTECTOMY  2011   [facility]   CIRCUMCISION N/A 12/09/2018   Procedure: CIRCUMCISION ADULT;  Surgeon: Lucas Mallow, MD;  [facility];  Service: Urology;  Laterality: N/A;   COLONOSCOPY  07/26/11   Crohn's colitis, ileitis   ESOPHAGOGASTRODUODENOSCOPY  05/04/11, 07/26/11   granulomatous gastritis - Crohn's   ESOPHAGOGASTRODUODENOSCOPY (EGD) WITH PROPOFOL N/A 03/28/2017   Procedure: ESOPHAGOGASTRODUODENOSCOPY (EGD) WITH PROPOFOL  with balloon dilation of duodenum;  Surgeon: [name], MD;  [facility];  Service: Gastroenterology;  Laterality: N/A;   ESOPHAGOGASTRODUODENOSCOPY (EGD) WITH PROPOFOL N/A 12/06/2017   Procedure: ESOPHAGOGASTRODUODENOSCOPY (EGD) WITH PROPOFOL;  Surgeon: Gatha Mayer, MD;  [facility];  Service: Endoscopy;  Laterality: N/A;   ESOPHAGOGASTRODUODENOSCOPY (EGD) WITH PROPOFOL N/A 06/21/2018   Procedure: ESOPHAGOGASTRODUODENOSCOPY (EGD) WITH PROPOFOL;  Surgeon: Gatha Mayer, MD;  [facility];  Service: Endoscopy;  Laterality: N/A;   ETHMOIDECTOMY N/A 11/27/2018   Procedure: ENDOSCOPIC TOTAL ETHMOIDECTOMY;  Surgeon: Leta Baptist, MD;  Location: [place];  Service: ENT;  Laterality: N/A;   FLEXIBLE SIGMOIDOSCOPY     GASTROJEJUNOSTOMY  01/2018   MULTIPLE TOOTH EXTRACTIONS  2005   NASAL SEPTOPLASTY W/ TURBINOPLASTY Bilateral 11/27/2018   Procedure: NASAL SEPTOPLASTY WITH BILATERAL TURBINATE REDUCTION;  Surgeon: Leta Baptist, MD;  Location: [place];  Service: ENT;  Laterality: Bilateral;   SHOULDER SURGERY     right   SINUS ENDO WITH FUSION N/A  11/27/2018   Procedure: SINUS ENDO WITH FUSION;  Surgeon: Leta Baptist, MD;  Location: [place];  Service: ENT;  Laterality: N/A;   UPPER GASTROINTESTINAL ENDOSCOPY     VASECTOMY     bilateral w/lysis of penile adhesions   Allergies  Allergen Reactions   Zithromax [Azithromycin] Hypertension   Lisinopril Swelling    SWELLING REACTION UNSPECIFIED    Tessalon Perles [Benzonatate]     hypertension   Prior to Admission medications   Medication Sig Start Date End Date Taking? Authorizing Provider  cyanocobalamin (,VITAMIN B-12,) 1000 MCG/ML injection PLEASE SEE ATTACHED FOR DETAILED DIRECTIONS 10/22/20  Yes [provider]  cyclobenzaprine (FLEXERIL) 10 MG tablet Take 1 tablet (10 mg total) by mouth 3 (three) times daily as needed for muscle spasms. 04/02/20  Yes Raylene Everts, MD  doxazosin (CARDURA) 8 MG tablet TAKE ONE TABLET BY MOUTH AT BEDTIME 03/18/21  Yes [name], MD  famotidine (PEPCID) 40 MG tablet Take 1 tablet (40 mg total) by mouth daily. 03/05/20  Yes Silvano Rusk  E, MD  olmesartan (BENICAR) 20 MG tablet TAKE ONE TABLET BY MOUTH ONCE DAILY 03/18/21  Yes [name], MD  ondansetron (ZOFRAN-ODT) 4 MG disintegrating tablet Take 1 tablet (4 mg total) by mouth every 8 (eight) hours as needed for nausea or vomiting. 12/10/20  Yes Gatha Mayer, MD  prochlorperazine (COMPAZINE) 25 MG suppository Place 1 suppository (25 mg total) rectally every 12 (twelve) hours as needed for nausea or vomiting. 10/20/20  Yes Gatha Mayer, MD  rizatriptan (MAXALT-MLT) 10 MG disintegrating tablet TAKE 1 TABLET 3 TIME A DA Y AS NEEDED FOR MIGRAINE 01/26/20  Yes Gatha Mayer, MD  verapamil (CALAN-SR) 180 MG CR tablet TAKE TWO TABLETS BY MOUTH ONCE DAILY 03/18/21  Yes [name], MD  albuterol (VENTOLIN HFA) 108 (90 Base) MCG/ACT inhaler Inhale 1-2 puffs into the lungs every 6 (six) hours as needed for wheezing or shortness of breath. Patient not taking: Reported on 03/30/2021  03/01/21   [name], MD  azelastine (OPTIVAR) 0.05 % ophthalmic solution Place 1 drop into both eyes 2 (two) times daily. Patient not taking: Reported on 03/30/2021 09/08/20   [name], MD  clotrimazole [facility]) 10 MG troche Take 1 tablet (10 mg total) by mouth 5 (five) times daily. Patient not taking: Reported on 03/30/2021 09/08/20   [name], MD  hyoscyamine (ANASPAZ) 0.125 MG TBDP disintergrating tablet Place 1 tablet (0.125 mg total) under the tongue every 4 (four) hours as needed for cramping. Patient not taking: Reported on 03/30/2021 03/05/20   Gatha Mayer, MD  predniSONE (DELTASONE) 20 MG tablet Take 2 tablets (40 mg total) by mouth daily with breakfast. Patient not taking: Reported on 03/30/2021 02/07/21   Volney American, PA-C  promethazine-dextromethorphan (PROMETHAZINE-DM) 6.25-15 MG/5ML syrup Take 5 mLs by mouth 4 (four) times daily as needed for cough. Patient not taking: Reported on 03/30/2021 02/07/21   Volney American, PA-C  traMADol (ULTRAM) 50 MG tablet Take 1 tablet (50 mg total) by mouth every 6 (six) hours as needed. Patient not taking: Reported on 03/30/2021 04/02/20   Raylene Everts, MD  Vitamin D, Ergocalciferol, (DRISDOL) 1.25 MG (50000 UNIT) CAPS capsule Take 1 capsule (50,000 Units total) by mouth every 7 (seven) days. Patient not taking: Reported on 03/30/2021 10/22/20   Gatha Mayer, MD   Social History   Socioeconomic History   Marital status: Married    Spouse name: Not on file   Number of children: 3   Years of education: Some college   Highest education level: Not on file  Occupational History   Occupation: Environmental health practitioner: [employer]  Tobacco Use   Smoking status: Former    Packs/day: 0.75    Years: 21.00    Pack years: 15.75    Types: Cigarettes    Quit date: 12/01/2017    Years since quitting: 3.3   Smokeless tobacco: Former    Types: Snuff   Tobacco comments:    Counseling sheet given in exam room   Vaping  Use   Vaping Use: Never used  Substance and Sexual Activity   Alcohol use: Not Currently   Drug use: No   Sexual activity: Not on file  Other Topics Concern   Not on file  Social History Narrative   Married. Education: [employer].    Lives at home w/ his wife and Engineer, site for [employer] -on disability vocational rehab pursuing different career   Right-handed  Caffeine: 3 sodas per day   Occasional alcohol no drugs   Former smoker former snuff user   Social Determinants of Radio broadcast assistant Strain: Not on file  Food Insecurity: Not on file  Transportation Needs: Not on file  Physical Activity: Not on file  Stress: Not on file  Social Connections: Not on file  Intimate Partner Violence: Not on file    Review of Systems  Constitutional:  Negative for fatigue and unexpected weight change.  Eyes:  Negative for visual disturbance.  Respiratory:  Negative for cough, chest tightness and shortness of breath.   Cardiovascular:  Negative for chest pain, palpitations and leg swelling.  Gastrointestinal:  Negative for abdominal pain and blood in stool.  Neurological:  Negative for dizziness, light-headedness and headaches.   Per HPI.   Objective:   Vitals:   03/30/21 1008  BP: 128/84  Pulse: 89  Resp: 16  Temp: 98.2 F (36.8 C)  TempSrc: Temporal  SpO2: 98%  Weight: (!) 366 lb 9.6 oz (166.3 kg)  Height: 6' (1.829 m)     Physical Exam Vitals reviewed.  Constitutional:      Appearance: He is well-developed. He is obese.  HENT:     Head: Normocephalic and atraumatic.  Neck:     Vascular: No carotid bruit or JVD.  Cardiovascular:     Rate and Rhythm: Normal rate and regular rhythm.     Heart sounds: Normal heart sounds. No murmur heard. Pulmonary:     Effort: Pulmonary effort is normal. No respiratory distress.     Breath sounds: Normal breath sounds. No wheezing, rhonchi or rales.  Musculoskeletal:     Right lower leg: No edema.     Left lower leg:  No edema.  Skin:    General: Skin is warm and dry.  Neurological:     Mental Status: He is alert and oriented to person, place, and time.  Psychiatric:        Mood and Affect: Mood normal.        Behavior: Behavior normal.    Assessment & Plan:  Yehuda Printup. is a 50 y.o. male . Bronchitis due to 2019-nCoV  -Improved, no recent need for albuterol.  Anticipate fatigue will also continue to improve.  RTC precautions.  Discussed bivalent COVID booster recommended finding with recent infection.  Need for influenza vaccination - Plan: Flu Vaccine QUAD 6+ mos PF IM (Fluarix Quad PF)  Prediabetes - Plan: Hemoglobin A1c  -Commended on decreased dark sodas but also recommending avoid light sodas, option of sparkling flavored water due to need for belching with his underlying gastrointestinal issues.  Continue other lifestyle modifications including low intensity exercise.  Check A1c, recheck 6 months.  Hyperlipidemia, unspecified hyperlipidemia type - Plan: Comprehensive metabolic panel, Lipid panel  -No current statin, check labs, prior elevated LFTs.  Hold on new meds for now.  Essential hypertension  -Stable, no med changes.  Check labs.  No orders of the defined types were placed in this encounter.  Patient Instructions  [facility] or other sparkling water may be a better substitute than soda.  I will check some labs today including a prediabetes test.  Keep up the good work with trying to increase activity and weight loss.  Continue to try to avoid fast food.  I will also check your cholesterol levels and other electrolytes and let you know if any changes are needed.  I am glad to hear that the bronchitis and COVID  symptoms have improved.  Follow-up if any worsening symptoms.  As we discussed I would still recommend a COVID-19 bivalent booster but if you want to wait for a month to 6 weeks to have that done that is reasonable.  You can have that performed at any point in time though and  can have that done sooner.  Return to the clinic or go to the nearest emergency room if any of your symptoms worsen or new symptoms occur.    Signed,   Merri Ray, MD [place] [employer] 03/30/21 11:02 AM

## 2021-07-30 ENCOUNTER — Other Ambulatory Visit: Payer: Self-pay | Admitting: Family Medicine

## 2021-07-30 DIAGNOSIS — R351 Nocturia: Secondary | ICD-10-CM

## 2021-07-30 DIAGNOSIS — I10 Essential (primary) hypertension: Secondary | ICD-10-CM

## 2021-08-04 ENCOUNTER — Encounter: Payer: Self-pay | Admitting: Internal Medicine

## 2021-08-04 ENCOUNTER — Ambulatory Visit: Payer: Medicare (Managed Care) | Admitting: Internal Medicine

## 2021-08-04 VITALS — BP 130/90 | HR 100 | Ht 72.0 in | Wt 362.0 lb

## 2021-08-04 DIAGNOSIS — K911 Postgastric surgery syndromes: Secondary | ICD-10-CM | POA: Diagnosis not present

## 2021-08-04 DIAGNOSIS — R1013 Epigastric pain: Secondary | ICD-10-CM | POA: Diagnosis not present

## 2021-08-04 DIAGNOSIS — K50018 Crohn's disease of small intestine with other complication: Secondary | ICD-10-CM

## 2021-08-04 NOTE — Patient Instructions (Signed)
You have been scheduled for a CT enterography  scan of the abdomen and pelvis at [facility], 1st floor Radiology. You are scheduled on 08/11/21  at 2:30pm. You should arrive 60 minutes prior to your appointment time for registration and prepping.  Please follow the written instructions below on the day of your exam:   1) Do not eat anything after 10:30am (4 hours prior to your test)   If you have any questions regarding your exam or if you need to reschedule, you may call [facility] at [phone] between the hours of 8:00 am and 5:00 pm, Monday-Friday.   I appreciate the opportunity to care for you. Silvano Rusk, MD, [facility]

## 2021-08-04 NOTE — Progress Notes (Signed)
Craig Guzman. 51 y.o. 1970-07-31 [id number]  Assessment & Plan:   Encounter Diagnoses  Name Primary?   Gastroduodenal Crohn's disease with other complication (HCC) Yes   Postsurgical dumping syndrome    Abdominal pain, epigastric     The symptoms actually sound stable to me.  It is frustrating for the patient understandably so.  Over the years even before his surgery he has had a lot of similar symptoms though now has more of a dumping syndrome like problem.  I have not shown recurrent active Crohn's disease outside of the anastomosis but he has not developed a recurrent stricture.  Colonoscopy prior to his surgery (2019) did not show any of colitis.  He had some transiently years ago.  I am going to have him do a CT enterography to see what that might show [place].  I am not sure what else we might be able to do.  Consider referral to another dietitian I think he went to 1 preoperatively years ago but has not been to 1 since he had his surgery.  Consider tertiary referral.    Subjective:   Chief Complaint: Vomiting abdominal pain diarrhea  HPI 51 year old African-American man with gastroduodenal and prior colonic Crohn's disease status post gastroduodenal resection for a stricture who presents with persistent chronic vomiting issues and epigastric pain.  He has been on metoclopramide which he is intolerant of and domperidone with limited help in the past prior to his surgery.  Very continues to have a dull epigastric pain that waxes and wanes.  He has gotten off many of his medications without deterioration.  He finds that cold slushy's are tolerated better than warm foods he is unable to eat beef chicken or pork unless these foods are ground.  He has been eating on ice which he says suits things.  He continues to have to lie down after eating for vomiting or regurgitation will occur.  Once a week he will vomit acidic fluid any "keeps a sour stomach".  Also has issues with urgent  defecation after certain meals it does sound like high carbohydrate or sugar meals we will do that.  This is often proceeded with lower abdominal pain.  In the past he has been on cholestyramine but he had to stop that due to constipation.  He said he had been improving on weight loss but COVID that he had in the fall set him back.    Wt Readings from Last 3 Encounters:  08/04/21 (!) 362 lb (164.2 kg)  03/30/21 (!) 366 lb 9.6 oz (166.3 kg)  11/01/20 (!) 352 lb (159.7 kg)  Normal CMET September 2022 Hemoglobin A1c 6.2 Triglycerides 114 cholesterol 163 HDL 32 EGD in May with persistent mild inflammation that includes granulomas at the gastroenteric anastomosis Allergies  Allergen Reactions   Zithromax [Azithromycin] Hypertension   Lisinopril Swelling    SWELLING REACTION UNSPECIFIED    Tessalon Perles [Benzonatate]     hypertension   Current Meds  Medication Sig   cyanocobalamin (,VITAMIN B-12,) 1000 MCG/ML injection PLEASE SEE ATTACHED FOR DETAILED DIRECTIONS   cyclobenzaprine (FLEXERIL) 10 MG tablet Take 1 tablet (10 mg total) by mouth 3 (three) times daily as needed for muscle spasms.   doxazosin (CARDURA) 8 MG tablet TAKE ONE TABLET BY MOUTH AT BEDTIME   famotidine (PEPCID) 40 MG tablet Take 1 tablet (40 mg total) by mouth daily.   olmesartan (BENICAR) 20 MG tablet TAKE ONE TABLET BY MOUTH ONCE DAILY   ondansetron (ZOFRAN-ODT) 4  MG disintegrating tablet Take 1 tablet (4 mg total) by mouth every 8 (eight) hours as needed for nausea or vomiting.   prochlorperazine (COMPAZINE) 25 MG suppository Place 1 suppository (25 mg total) rectally every 12 (twelve) hours as needed for nausea or vomiting.   rizatriptan (MAXALT-MLT) 10 MG disintegrating tablet TAKE 1 TABLET 3 TIME A DA Y AS NEEDED FOR MIGRAINE   verapamil (CALAN-SR) 180 MG CR tablet TAKE TWO TABLETS BY MOUTH ONCE DAILY   Past Medical History:  Diagnosis Date   Allergy    trees/ grasses/ animals/dust/mold   Biliary dyskinesia     Colloid thyroid nodule    COVID-19 01/2021   Crohn's disease ([place])    Stomach, terminal ileum, cecum   Dyspnea    due to nasal /sinus congestion   Gastric ulcer    antral   GERD (gastroesophageal reflux disease)    History of kidney stones    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   History of migraine headaches    HTN (hypertension)    Kidney stone 09/20/2012   Migraine    Obesity    Pneumonia 02/27/2014   ED   Postsurgical dumping syndrome 02/14/2019   Sleep apnea    has cpap- does not wear    Past Surgical History:  Procedure Laterality Date   BALLOON DILATION N/A 12/06/2017   Procedure: BALLOON DILATION;  Surgeon: Gatha Mayer, MD;  [facility];  Service: Endoscopy;  Laterality: N/A;   BIOPSY  06/21/2018   Procedure: BIOPSY;  Surgeon: Gatha Mayer, MD;  [facility];  Service: Endoscopy;;   CHOLECYSTECTOMY  2011   [facility]   CIRCUMCISION N/A 12/09/2018   Procedure: CIRCUMCISION ADULT;  Surgeon: Lucas Mallow, MD;  [facility];  Service: Urology;  Laterality: N/A;   COLONOSCOPY  07/26/11   Crohn's colitis, ileitis   ESOPHAGOGASTRODUODENOSCOPY  05/04/11, 07/26/11   granulomatous gastritis - Crohn's   ESOPHAGOGASTRODUODENOSCOPY (EGD) WITH PROPOFOL N/A 03/28/2017   Procedure: ESOPHAGOGASTRODUODENOSCOPY (EGD) WITH PROPOFOL  with balloon dilation of duodenum;  Surgeon: [name], MD;  [facility];  Service: Gastroenterology;  Laterality: N/A;   ESOPHAGOGASTRODUODENOSCOPY (EGD) WITH PROPOFOL N/A 12/06/2017   Procedure: ESOPHAGOGASTRODUODENOSCOPY (EGD) WITH PROPOFOL;  Surgeon: Gatha Mayer, MD;  [facility];  Service: Endoscopy;  Laterality: N/A;   ESOPHAGOGASTRODUODENOSCOPY (EGD) WITH PROPOFOL N/A 06/21/2018   Procedure: ESOPHAGOGASTRODUODENOSCOPY (EGD) WITH PROPOFOL;  Surgeon: Gatha Mayer, MD;  [facility];  Service: Endoscopy;  Laterality: N/A;   ETHMOIDECTOMY N/A 11/27/2018    Procedure: ENDOSCOPIC TOTAL ETHMOIDECTOMY;  Surgeon: Leta Baptist, MD;  Location: [place];  Service: ENT;  Laterality: N/A;   FLEXIBLE SIGMOIDOSCOPY     GASTROJEJUNOSTOMY  01/2018   MULTIPLE TOOTH EXTRACTIONS  2005   NASAL SEPTOPLASTY W/ TURBINOPLASTY Bilateral 11/27/2018   Procedure: NASAL SEPTOPLASTY WITH BILATERAL TURBINATE REDUCTION;  Surgeon: Leta Baptist, MD;  Location: [place];  Service: ENT;  Laterality: Bilateral;   SHOULDER SURGERY     right   SINUS ENDO WITH FUSION N/A 11/27/2018   Procedure: SINUS ENDO WITH FUSION;  Surgeon: Leta Baptist, MD;  Location: [place];  Service: ENT;  Laterality: N/A;   UPPER GASTROINTESTINAL ENDOSCOPY     VASECTOMY     bilateral w/lysis of penile adhesions   Social History   Social History Narrative   Married. Education: [employer].    Lives at home w/ his wife and Engineer, site for [employer] -  on disability vocational rehab pursuing different career   Right-handed   Caffeine: 3 sodas per day   Occasional alcohol no drugs   Former smoker former snuff user   family history includes Asthma in his mother; Colon polyps in his father; Diabetes in his father; Heart disease in his maternal grandmother; Hypertension in his father and mother.   Review of Systems See above  Objective:   Physical Exam BP 130/90    Pulse 100    Ht 6' (1.829 m)    Wt (!) 362 lb (164.2 kg)    BMI 49.10 kg/m  Severely obese bm NAD Lungs cta Cor NL Abd very obese soft w/ tenderness LUQ no masses Alert and oriented x 3

## 2021-08-10 ENCOUNTER — Telehealth: Payer: Self-pay | Admitting: Internal Medicine

## 2021-08-10 NOTE — Telephone Encounter (Signed)
I spoke with Craig Guzman and he said he has to go to [facility] for his CT. I spoke to them at [phone]. They said fax over the order along with the authorization # to fax # [phone] and they will contact the patient to set this up. I called Craig Guzman back and told him we are working on getting the prior authorization # and that they will contact him to set up the scan.

## 2021-08-10 NOTE — Telephone Encounter (Signed)
Order faxed to [facility], authorization # [id number]

## 2021-08-10 NOTE — Telephone Encounter (Signed)
Patient called requesting to speak with you regarding his CT appt.

## 2021-08-11 ENCOUNTER — Ambulatory Visit (HOSPITAL_COMMUNITY): Payer: Medicare (Managed Care)

## 2021-08-12 NOTE — Telephone Encounter (Signed)
I spoke to [facility] and Fox has an appointment 2/15/202 and they will fax [place] the results.

## 2021-09-12 ENCOUNTER — Telehealth: Payer: Self-pay | Admitting: Internal Medicine

## 2021-09-12 ENCOUNTER — Other Ambulatory Visit: Payer: Self-pay

## 2021-09-12 DIAGNOSIS — K911 Postgastric surgery syndromes: Secondary | ICD-10-CM

## 2021-09-12 DIAGNOSIS — Z903 Acquired absence of stomach [part of]: Secondary | ICD-10-CM

## 2021-09-12 DIAGNOSIS — K912 Postsurgical malabsorption, not elsewhere classified: Secondary | ICD-10-CM

## 2021-09-12 NOTE — Telephone Encounter (Signed)
Called patient regarding CT enterography results.  [facility] to [facility] issues.  08/24/2021.  It showed postsurgical changes of his gastrojejunostomy no complications of Crohn's and some chronic mildly enlarged upper abdominal lymph nodes and aortic atherosclerosis. ? ?No cause of his abdominal pain which we both think may be musculoskeletal at this point. ? ?He continues to have this vomiting issue postoperatively and I wonder if some of it might not be related to his postsurgical anatomy and perhaps even some dumping syndrome.  I am going to have him see dietitian again please. ? ?Please refer to dietitian regarding postgastrectomy and postsurgical dumping syndrome. ? ? ?

## 2021-09-12 NOTE — Telephone Encounter (Signed)
Ambulatory referral placed in Epic for dietitian regarding postgastrectomy and postsurgical dumping syndrome. Nutrition Diabetes Education Service (NDES) Pt Made aware that referral has been placed: ?Pt verbalized understanding with all questions answered.  ? ?

## 2021-09-23 DIAGNOSIS — R351 Nocturia: Secondary | ICD-10-CM | POA: Insufficient documentation

## 2021-09-28 ENCOUNTER — Encounter: Payer: Self-pay | Admitting: Family Medicine

## 2021-09-28 ENCOUNTER — Ambulatory Visit (INDEPENDENT_AMBULATORY_CARE_PROVIDER_SITE_OTHER): Payer: Medicare (Managed Care) | Admitting: Family Medicine

## 2021-09-28 VITALS — BP 118/76 | HR 88 | Temp 98.2°F | Resp 18 | Ht 72.0 in | Wt 367.8 lb

## 2021-09-28 DIAGNOSIS — R739 Hyperglycemia, unspecified: Secondary | ICD-10-CM

## 2021-09-28 DIAGNOSIS — Z23 Encounter for immunization: Secondary | ICD-10-CM | POA: Diagnosis not present

## 2021-09-28 DIAGNOSIS — R7303 Prediabetes: Secondary | ICD-10-CM

## 2021-09-28 DIAGNOSIS — Z0000 Encounter for general adult medical examination without abnormal findings: Secondary | ICD-10-CM

## 2021-09-28 DIAGNOSIS — Z125 Encounter for screening for malignant neoplasm of prostate: Secondary | ICD-10-CM

## 2021-09-28 DIAGNOSIS — E785 Hyperlipidemia, unspecified: Secondary | ICD-10-CM

## 2021-09-28 DIAGNOSIS — I10 Essential (primary) hypertension: Secondary | ICD-10-CM | POA: Diagnosis not present

## 2021-09-28 DIAGNOSIS — R351 Nocturia: Secondary | ICD-10-CM

## 2021-09-28 MED ORDER — DOXAZOSIN MESYLATE 8 MG PO TABS: 8.0000 mg | ORAL_TABLET | Freq: Every day | ORAL | 1 refills | Status: DC

## 2021-09-28 MED ORDER — OLMESARTAN MEDOXOMIL 20 MG PO TABS: 20.0000 mg | ORAL_TABLET | Freq: Every day | ORAL | 1 refills | Status: DC

## 2021-09-28 MED ORDER — VERAPAMIL HCL ER 180 MG PO TBCR: 360.0000 mg | EXTENDED_RELEASE_TABLET | Freq: Every day | ORAL | 1 refills | Status: DC

## 2021-09-28 NOTE — Patient Instructions (Addendum)
Try to cut back more on soda for the prediabetes.  ?Try to increase time of low intensity exercise to 30 minutes 5 days per week - goal of 155mn per week.  ?No med changes today. Take care! ? ?Preventive Care [id number] Old, Male ?Preventive care refers to lifestyle choices and visits with your health care provider that can promote health and wellness. Preventive care visits are also called wellness exams. ?What can I expect for my preventive care visit? ?Counseling ?During your preventive care visit, your health care provider may ask about your: ?Medical history, including: ?Past medical problems. ?Family medical history. ?Current health, including: ?Emotional well-being. ?Home life and relationship well-being. ?Sexual activity. ?Lifestyle, including: ?Alcohol, nicotine or tobacco, and drug use. ?Access to firearms. ?Diet, exercise, and sleep habits. ?Safety issues such as seatbelt and bike helmet use. ?Sunscreen use. ?Work and work eStatistician ?Physical exam ?Your health care provider will check your: ?Height and weight. These may be used to calculate your BMI (body mass index). BMI is a measurement that tells if you are at a healthy weight. ?Waist circumference. This measures the distance around your waistline. This measurement also tells if you are at a healthy weight and may help predict your risk of certain diseases, such as type 2 diabetes and high blood pressure. ?Heart rate and blood pressure. ?Body temperature. ?Skin for abnormal spots. ?What immunizations do I need? ?Vaccines are usually given at various ages, according to a schedule. Your health care provider will recommend vaccines for you based on your age, medical history, and lifestyle or other factors, such as travel or where you work. ?What tests do I need? ?Screening ?Your health care provider may recommend screening tests for certain conditions. This may include: ?Lipid and cholesterol levels. ?Diabetes screening. This is done by checking your  blood sugar (glucose) after you have not eaten for a while (fasting). ?Hepatitis B test. ?Hepatitis C test. ?HIV (human immunodeficiency virus) test. ?STI (sexually transmitted infection) testing, if you are at risk. ?Lung cancer screening. ?Prostate cancer screening. ?Colorectal cancer screening. ?Talk with your health care provider about your test results, treatment options, and if necessary, the need for more tests. ?Follow these instructions at home: ?Eating and drinking ? ?Eat a diet that includes fresh fruits and vegetables, whole grains, lean protein, and low-fat dairy products. ?Take vitamin and mineral supplements as recommended by your health care provider. ?Do not drink alcohol if your health care provider tells you not to drink. ?If you drink alcohol: ?Limit how much you have to 0-2 drinks a day. ?Know how much alcohol is in your drink. In the [place] one drink equals one 12 oz bottle of beer (355 mL), one 5 oz glass of wine (148 mL), or one 1? oz glass of hard liquor (44 mL). ?Lifestyle ?Brush your teeth every morning and night with fluoride toothpaste. Floss one time each day. ?Exercise for at least 30 minutes 5 or more days each week. ?Do not use any products that contain nicotine or tobacco. These products include cigarettes, chewing tobacco, and vaping devices, such as e-cigarettes. If you need help quitting, ask your health care provider. ?Do not use drugs. ?If you are sexually active, practice safe sex. Use a condom or other form of protection to prevent STIs. ?Take aspirin only as told by your health care provider. Make sure that you understand how much to take and what form to take. Work with your health care provider to find out whether it is safe and beneficial  for you to take aspirin daily. ?Find healthy ways to manage stress, such as: ?Meditation, yoga, or listening to music. ?Journaling. ?Talking to a trusted person. ?Spending time with friends and family. ?Minimize exposure to UV radiation  to reduce your risk of skin cancer. ?Safety ?Always wear your seat belt while driving or riding in a vehicle. ?Do not drive: ?If you have been drinking alcohol. Do not ride with someone who has been drinking. ?When you are tired or distracted. ?While texting. ?If you have been using any mind-altering substances or drugs. ?Wear a helmet and other protective equipment during sports activities. ?If you have firearms in your house, make sure you follow all gun safety procedures. ?What's next? ?Go to your health care provider once a year for an annual wellness visit. ?Ask your health care provider how often you should have your eyes and teeth checked. ?Stay up to date on all vaccines. ?This information is not intended to replace advice given to you by your health care provider. Make sure you discuss any questions you have with your health care provider. ?Document Revised: 12/15/2020 Document Reviewed: 12/15/2020 ?Elsevier Patient Education ? 2022 [place]. ? ? ? ? ?

## 2021-09-28 NOTE — Progress Notes (Signed)
? ?Subjective:  ?Patient ID: Kenshin Splawn., male    DOB: June 02, 1971  Age: 51 y.o. MRN: [id number] ? ?CC:  ?Chief Complaint  ?Patient presents with  ? Annual Exam  ?  Pt here for annual no concerns would like Flu shot and Tetanus today if possible, also in need of refills curtesy given 07/25/21  ? ? ?HPI ?[name]. presents for Annual Exam ? ?Hypertension: ?Treated with doxazosin, olmesartan, verapamil. No new med side effects.  ?Home readings: 120-130/85-90. ?BP Readings from Last 3 Encounters:  ?09/28/21 118/76  ?08/04/21 130/90  ?03/30/21 128/84  ? ?Lab Results  ?Component Value Date  ? CREATININE 0.84 03/30/2021  ? ?Prediabetes: ?Weight similar to September visit. ?Exercise - walking in grocery store, walking to end of block and back - 10-35mn.  ?Sweet tea/soda- 16oz soda per day.  ?Fast food - less. 1-2x/week.  ?Will be seeing nutritionist next month.  ?Lab Results  ?Component Value Date  ? HGBA1C 6.2 03/30/2021  ? ?Wt Readings from Last 3 Encounters:  ?09/28/21 (!) 367 lb 12.8 oz (166.8 kg)  ?08/04/21 (!) 362 lb (164.2 kg)  ?03/30/21 (!) 366 lb 9.6 oz (166.3 kg)  ? ? ?Hyperlipidemia: ?Borderline ASCVD risk score in the past, previous elevated LFTs, plan for lifestyle modifications, no current statin. ?The 10-year ASCVD risk score (Arnett DK, et al., 2019) is: 7.7% ?  Values used to calculate the score: ?    Age: 51years ?    Sex: Male ?    Is Non-Hispanic African American: Yes ?    Diabetic: No ?    Tobacco smoker: No ?    Systolic Blood Pressure: 1998mmHg ?    Is BP treated: Yes ?    HDL Cholesterol: 32.6 mg/dL ?    Total Cholesterol: 163 mg/dL ?  ?Lab Results  ?Component Value Date  ? CHOL 163 03/30/2021  ? HDL 32.60 (L) 03/30/2021  ? LDLCALC 107 (H) 03/30/2021  ? TRIG 114.0 03/30/2021  ? CHOLHDL 5 03/30/2021  ? ?Lab Results  ?Component Value Date  ? ALT 32 03/30/2021  ? AST 35 03/30/2021  ? ALKPHOS 112 03/30/2021  ? BILITOT 0.4 03/30/2021  ? ?Followed by Dr. GCarlean Purl [facility] for Crohn's. No recent  prednisone.  ? ? ? ?  09/28/2021  ?  2:11 PM 03/30/2021  ? 10:14 AM 09/08/2020  ?  1:54 PM 03/10/2020  ?  3:55 PM 09/10/2019  ?  4:07 PM  ?Depression screen PHQ 2/9  ?Decreased Interest 0 0 0 0 0  ?Down, Depressed, Hopeless 0 0 0 0 0  ?PHQ - 2 Score 0 0 0 0 0  ?Altered sleeping 0      ?Tired, decreased energy 0      ?Change in appetite 0      ?Feeling bad or failure about yourself  0      ?Trouble concentrating 0      ?Moving slowly or fidgety/restless 0      ?Suicidal thoughts 0      ?PHQ-9 Score 0      ? ? ?Health Maintenance  ?Topic Date Due  ? TETANUS/TDAP  08/31/2021  ? COVID-19 Vaccine (4 - Booster for Moderna series) 10/14/2021 (Originally 09/11/2020)  ? COLONOSCOPY (Pts 45-444yrInsurance coverage will need to be confirmed)  01/25/2028  ? INFLUENZA VACCINE  Completed  ? Hepatitis C Screening  Completed  ? HIV Screening  Completed  ? HPV VACCINES  Aged Out  ?Tetanus and flu vaccine today.  ? ?  Colonscopy 2019 - regular GI follow up with Crohns.  ?Prostate: does not have family history of prostate cancer ?The natural history of prostate cancer and ongoing controversy regarding screening and potential treatment outcomes of prostate cancer has been discussed with the patient. The meaning of a false positive PSA and a false negative PSA has been discussed. He indicates understanding of the limitations of this screening test and wishes to proceed with screening PSA testing. ?Lab Results  ?Component Value Date  ? PSA1 0.3 11/08/2017  ? PSA1 0.3 11/06/2016  ? PSA 0.2 03/25/2016  ? PSA 0.20 09/19/2011  ? PSA 0.21 09/01/2011  ? ? ? ? ?Immunization History  ?Administered Date(s) Administered  ? Hepatitis A 09/01/2011, 03/22/2012  ? Influenza,inj,Quad PF,6+ Mos 05/01/2013, 04/07/2015, 06/26/2016, 04/12/2018, 03/06/2019  ? Moderna Sars-Covid-2 Vaccination 10/30/2019, 11/27/2019, 07/17/2020  ? PPD Test 05/19/2011, 12/24/2012, 12/28/2014  ? Pneumococcal Polysaccharide-23 09/01/2011  ? Tdap 09/01/2011  ? ? ?Vision Screening  ? Right  eye Left eye Both eyes  ?Without correction     ?With correction 20/20-1 20/20 20/20  ?Wears glasses. Plans to follow up with optho soon.  ? ?Dental:No planning on establishing with new dentist.  ? ?Alcohol: none ? ?Tobacco: none.  ? ?Exercise: ? As above  walking about 10-66mn per day.  ? ?History ?Patient Active Problem List  ? Diagnosis Date Noted  ? Abdominal migraine, not intractable ? 03/21/2019  ? Postsurgical dumping syndrome 02/14/2019  ? S/P functional endoscopic sinus surgery 11/27/2018  ? Abdominal pain, epigastric   ? Angioedema 04/19/2018  ? Intractable vomiting with nausea   ? Cigarette nicotine dependence without complication 184/16/6063 ? Shifting sleep-work schedule, affecting sleep 05/15/2016  ? Sleep deprivation 05/15/2016  ? Sleep related hypoventilation in conditions classified elsewhere 05/15/2016  ? Sleep related headaches 05/15/2016  ? Morbid obesity ([place] 09/19/2013  ? Diarrhea 07/10/2013  ? 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 Hypertension 11/26/2012  ? Gastroparesis??? 12/08/2011  ? Common migraine without aura 12/08/2011  ? Long-term use of immunosuppressant medication 08/08/2011  ? Gastroduodenal Crohn's disease ([place] 05/17/2011  ? GERD (gastroesophageal reflux disease) 04/21/2011  ? ?Past Medical History:  ?Diagnosis Date  ? Allergy   ? trees/ grasses/ animals/dust/mold  ? Biliary dyskinesia   ? Colloid thyroid nodule   ? COVID-19 01/2021  ? Crohn's disease ([place]   ? Stomach, terminal ileum, cecum  ? Dyspnea   ? due to nasal /sinus congestion  ? Gastric ulcer   ? antral  ? GERD (gastroesophageal reflux disease)   ? History of kidney stones   ? 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 History of migraine headaches   ? HTN (hypertension)   ? Kidney stone 09/20/2012  ? Migraine   ? Obesity   ? 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   ? Pneumonia 02/27/2014  ? ED  ? Postsurgical dumping syndrome 02/14/2019  ? Sleep apnea   ? has cpap- does not wear   ? ?Past Surgical  History:  ?Procedure Laterality Date  ? BALLOON DILATION N/A 12/06/2017  ? Procedure: BALLOON DILATION;  Surgeon: GGatha Mayer MD;  [facility];  Service: Endoscopy;  Laterality: N/A;  ? BIOPSY  06/21/2018  ? Procedure: BIOPSY;  Surgeon: GGatha Mayer MD;  [facility];  Service: Endoscopy;;  ? CHOLECYSTECTOMY  2011  ? [place]  ? CIRCUMCISION N/A 12/09/2018  ? Procedure: CIRCUMCISION ADULT;  Surgeon: BLucas Mallow MD;  [facility];  Service:  Urology;  Laterality: N/A;  ? COLONOSCOPY  07/26/11  ? Crohn's colitis, ileitis  ? ESOPHAGOGASTRODUODENOSCOPY  05/04/11, 07/26/11  ? granulomatous gastritis - Crohn's  ? ESOPHAGOGASTRODUODENOSCOPY (EGD) WITH PROPOFOL N/A 03/28/2017  ? Procedure: ESOPHAGOGASTRODUODENOSCOPY (EGD) WITH PROPOFOL  with balloon dilation of duodenum;  Surgeon: [name], MD;  [facility];  Service: Gastroenterology;  Laterality: N/A;  ? ESOPHAGOGASTRODUODENOSCOPY (EGD) WITH PROPOFOL N/A 12/06/2017  ? Procedure: ESOPHAGOGASTRODUODENOSCOPY (EGD) WITH PROPOFOL;  Surgeon: Gatha Mayer, MD;  [facility];  Service: Endoscopy;  Laterality: N/A;  ? ESOPHAGOGASTRODUODENOSCOPY (EGD) WITH PROPOFOL N/A 06/21/2018  ? Procedure: ESOPHAGOGASTRODUODENOSCOPY (EGD) WITH PROPOFOL;  Surgeon: Gatha Mayer, MD;  [facility];  Service: Endoscopy;  Laterality: N/A;  ? ETHMOIDECTOMY N/A 11/27/2018  ? Procedure: ENDOSCOPIC TOTAL ETHMOIDECTOMY;  Surgeon: Leta Baptist, MD;  Location: [place];  Service: ENT;  Laterality: N/A;  ? FLEXIBLE SIGMOIDOSCOPY    ? GASTROJEJUNOSTOMY  01/2018  ? MULTIPLE TOOTH EXTRACTIONS  2005  ? NASAL SEPTOPLASTY W/ TURBINOPLASTY Bilateral 11/27/2018  ? Procedure: NASAL SEPTOPLASTY WITH BILATERAL TURBINATE REDUCTION;  Surgeon: Leta Baptist, MD;  Location: [place];  Service: ENT;  Laterality: Bilateral;  ? SHOULDER SURGERY    ? right  ? SINUS ENDO WITH FUSION N/A 11/27/2018  ? Procedure: SINUS ENDO WITH FUSION;  Surgeon: Leta Baptist, MD;   Location: [place];  Service: ENT;  Laterality: N/A;  ? UPPER GASTROINTESTINAL ENDOSCOPY    ? VASECTOMY    ? bilateral w/lysis of penile adhesions  ? ?Allergies  ?Allergen Reactions  ? Zithromax [Azithromycin] H[place]

## 2021-09-29 LAB — COMPREHENSIVE METABOLIC PANEL
ALT: 34 U/L (ref 0–53)
AST: 36 U/L (ref 0–37)
Albumin: 4.1 g/dL (ref 3.5–5.2)
Alkaline Phosphatase: 106 U/L (ref 39–117)
BUN: 10 mg/dL (ref 6–23)
CO2: 22 mEq/L (ref 19–32)
Calcium: 9.1 mg/dL (ref 8.4–10.5)
Chloride: 105 mEq/L (ref 96–112)
Creatinine, Ser: 1.06 mg/dL (ref 0.40–1.50)
GFR: 82.28 mL/min (ref >60.00)
Glucose, Bld: 95 mg/dL (ref 70–99)
Potassium: 3.8 mEq/L (ref 3.5–5.1)
Sodium: 138 mEq/L (ref 135–145)
Total Bilirubin: 0.5 mg/dL (ref 0.2–1.2)
Total Protein: 7.6 g/dL (ref 6.0–8.3)

## 2021-09-29 LAB — LIPID PANEL
Cholesterol: 183 mg/dL (ref 0–200)
HDL: 30 mg/dL — ABNORMAL LOW (ref >39.00)
LDL Cholesterol: 121 mg/dL — ABNORMAL HIGH (ref 0–99)
NonHDL: 153.12
Total CHOL/HDL Ratio: 6
Triglycerides: 162 mg/dL — ABNORMAL HIGH (ref 0.0–149.0)
VLDL: 32.4 mg/dL (ref 0.0–40.0)

## 2021-09-29 LAB — HEMOGLOBIN A1C: Hgb A1c MFr Bld: 6 % (ref 4.6–6.5)

## 2021-09-29 LAB — PSA: PSA: 0.17 ng/mL (ref 0.10–4.00)

## 2021-10-26 ENCOUNTER — Other Ambulatory Visit: Payer: Self-pay | Admitting: Internal Medicine

## 2021-10-26 NOTE — Telephone Encounter (Signed)
Do you want him to continue this Sir? ?

## 2021-10-31 ENCOUNTER — Encounter: Payer: Medicare (Managed Care) | Attending: Internal Medicine | Admitting: Skilled Nursing Facility1

## 2021-10-31 DIAGNOSIS — Z713 Dietary counseling and surveillance: Secondary | ICD-10-CM | POA: Diagnosis not present

## 2021-10-31 DIAGNOSIS — Z903 Acquired absence of stomach [part of]: Secondary | ICD-10-CM | POA: Diagnosis not present

## 2021-10-31 DIAGNOSIS — K912 Postsurgical malabsorption, not elsewhere classified: Secondary | ICD-10-CM | POA: Insufficient documentation

## 2021-10-31 DIAGNOSIS — R6339 Other feeding difficulties: Secondary | ICD-10-CM | POA: Insufficient documentation

## 2021-10-31 DIAGNOSIS — K911 Postgastric surgery syndromes: Secondary | ICD-10-CM | POA: Diagnosis present

## 2021-10-31 DIAGNOSIS — Z6841 Body Mass Index (BMI) 40.0 and over, adult: Secondary | ICD-10-CM | POA: Diagnosis not present

## 2021-10-31 DIAGNOSIS — K50019 Crohn's disease of small intestine with unspecified complications: Secondary | ICD-10-CM

## 2021-10-31 NOTE — Progress Notes (Signed)
Medical Nutrition Therapy  ? ?Appointment Start time:  2:00  Appointment End time:  3:10 ? ?Primary concerns today: sick all the time from eating; need a diet to keep from getting sick  ?Referral diagnosis: K91.01 ?Preferred learning style: no preference indicated ?Learning readiness: contemplative ? ? ?NUTRITION ASSESSMENT  ? ?Anthropometrics  ?Wt: 359.7 (Fat % 40.3, Water % 40.6)  ?Ht: 72" ? ?Body Composition Scale 10/31/2021  ?Current Body Weight 359.7  ?Total Body Fat % 40.3  ?Visceral Fat 38  ?Fat-Free Mass % 59.6  ? Total Body Water % 40.6  ?Muscle-Mass lbs 67.9  ?BMI 48.9  ?Body Fat Displacement   ?       Torso  lbs 18.0  ?       Left Leg  lbs 18.0  ?       Right Leg  lbs 9.0  ?       Left Arm  lbs 9.0  ?       Right Arm   lbs   ? ? ?Clinical ?Medical Hx: GERD, Sleep Apnea, Hypertension, Kidney Disease, Thyroid disease, Chrones disease, post surgical dumping ?Medications: Olesartan, prochlorperazine, Zofran-odt, Compazine, Maxalt-mlt, Calan-SR, Flexeril, Cardura, Pepcid ?Labs: 09/28/2021 A1C 6.0, Triglycerides 162, HDL 30.0, LDL 121; 10/20/2020 Vit D 8.11 ?Notable Signs/Symptoms:  ? ?Lifestyle & Dietary Hx ? ?Pt arrives with his supportive wife who took notes.  ? ?Pt states he vomits his food easily, and everything he eats comes back up.  Pt states Steak, chicken come up. Pt states [place] lunch meat is more tolerated. ?Pt states he can't move around after eating, stating he needs to lay on his left side for food to empty out of his stomach to finish digesting. ?Pt states he is nauseas when waking up. ?Pt states he gets exhausted easily. ?Pt states he vomits juice.  Pt states carbonated soda feels better, stating he can burp. ?Pt states dark soda leads to kidney stones. ?Pt states he does not take his supplements regularly, and his wife gives him his B-12 shot. ?Pt states he does not tolerate steak or chicken, as it sits heavy and comes back up.  Dietitian recommended fish, yogurt, and plant based proteins may  be more tolerable.  ? ?Supplements: B12 injections (wife administers), B1 but pt states he doesn't remember to take it. ? ?Current average weekly physical activity: no activity ? ?24-Hr Dietary Recall ?First Meal: skip ?Snack: peanut butter cracker or applesauce (snacks are rare) ?Second Meal: skip ?Snack: no ?Third Meal: [place] sandwich ?Snack: chips, sunflower seeds ?Beverages: sprite, Gatorade, flavored seltzer water, dr pepper, a little water (makes him sick) ? ? ?NUTRITION DIAGNOSIS  ?NI-5.9.1 Inadequate vitamin intake As related to inadequate nutrient intake and rerouted intestines.  As evidenced by low Vit D values. ? ? ?NUTRITION INTERVENTION  ?Nutrition education (E-1) on the following topics:  ?Micro nutrient deficiency risk ?Digestion/absorption of food and nutrients ? ? ?Handouts Provided Include  ?Multi Vitamin and Calcium supplement guide ? ?Learning Style & Readiness for Change ?Teaching method utilized: Visual & Auditory  ?Demonstrated degree of understanding via: Teach Back  ?Barriers to learning/adherence to lifestyle change: fear of getting sick ? ?Goals Established by Pt ?Multi Vitamin and Calcium ?Ask PCP about Vitamin D supplement ?Retake of vitamin labs: thiamin, vit D, B12 and if insurance covers and PCP agrees, folate and zinc. ?Increase fluid intake ?Eat tolerable nutrient dense foods every 3-5 hours ? ? ? ?MONITORING & EVALUATION ?Dietary intake and tolerable foods, labs, adherence to goals ? ?  Next Steps  ?Patient is to return 3 weeks. ?

## 2021-11-01 ENCOUNTER — Telehealth: Payer: Self-pay | Admitting: Internal Medicine

## 2021-11-01 DIAGNOSIS — K912 Postsurgical malabsorption, not elsewhere classified: Secondary | ICD-10-CM

## 2021-11-01 DIAGNOSIS — K909 Intestinal malabsorption, unspecified: Secondary | ICD-10-CM

## 2021-11-01 DIAGNOSIS — K50019 Crohn's disease of small intestine with unspecified complications: Secondary | ICD-10-CM

## 2021-11-01 NOTE — Telephone Encounter (Signed)
I spoke with Craig Guzman and I read the note from his visit yesterday with nutrition department. It says for PCP to order labs but he wants [place] to do them since we sent him to them. Is it okay for me to order, if so dx please. Thank you. ?

## 2021-11-01 NOTE — Telephone Encounter (Signed)
Patient called requesting to speak with CMA. Per patient, went to a nutritionist and was told he needed labs done. Please advise. ?

## 2021-11-02 ENCOUNTER — Other Ambulatory Visit (INDEPENDENT_AMBULATORY_CARE_PROVIDER_SITE_OTHER): Payer: Medicare (Managed Care)

## 2021-11-02 DIAGNOSIS — Z903 Acquired absence of stomach [part of]: Secondary | ICD-10-CM

## 2021-11-02 DIAGNOSIS — K909 Intestinal malabsorption, unspecified: Secondary | ICD-10-CM

## 2021-11-02 DIAGNOSIS — K50019 Crohn's disease of small intestine with unspecified complications: Secondary | ICD-10-CM | POA: Diagnosis not present

## 2021-11-02 DIAGNOSIS — K912 Postsurgical malabsorption, not elsewhere classified: Secondary | ICD-10-CM | POA: Diagnosis not present

## 2021-11-02 LAB — B12 AND FOLATE PANEL
Folate: 9.2 ng/mL (ref >5.9)
Vitamin B-12: 494 pg/mL (ref 211–911)

## 2021-11-02 LAB — VITAMIN D 25 HYDROXY (VIT D DEFICIENCY, FRACTURES): VITD: 7.91 ng/mL — ABNORMAL LOW (ref 30.00–100.00)

## 2021-11-02 NOTE — Telephone Encounter (Signed)
Henrene Pastor informed and said thank you. ?

## 2021-11-02 NOTE — Telephone Encounter (Signed)
Labs ordered ? ?Let Theodus know ?

## 2021-11-04 ENCOUNTER — Other Ambulatory Visit: Payer: Self-pay

## 2021-11-04 ENCOUNTER — Telehealth: Payer: Self-pay

## 2021-11-04 DIAGNOSIS — E559 Vitamin D deficiency, unspecified: Secondary | ICD-10-CM

## 2021-11-04 DIAGNOSIS — K912 Postsurgical malabsorption, not elsewhere classified: Secondary | ICD-10-CM

## 2021-11-04 DIAGNOSIS — K909 Intestinal malabsorption, unspecified: Secondary | ICD-10-CM

## 2021-11-04 MED ORDER — VITAMIN D (ERGOCALCIFEROL) 50000 UNITS PO CAPS: 50000.0000 [IU] | ORAL_CAPSULE | ORAL | 0 refills | Status: DC

## 2021-11-04 NOTE — Telephone Encounter (Signed)
Craig Guzman is aware we have received disability forms and we are working on them for him . They are due May 12th. Labs are pending.  ?

## 2021-11-07 NOTE — Telephone Encounter (Signed)
Labs still pending. ?

## 2021-11-09 LAB — EXTRA SPECIMEN

## 2021-11-09 LAB — VITAMIN B1: Vitamin B1 (Thiamine): 8 nmol/L (ref 8–30)

## 2021-11-09 LAB — ZINC

## 2021-11-09 NOTE — Telephone Encounter (Signed)
Forms have been faxed, Dr Carlean Purl said we didn't need to wait on the labs. Craig Guzman has been informed and he said he doesn't need them mailed to him. I informed him that we need to redraw the zinc. He will come by tomorrow. ?

## 2021-11-10 ENCOUNTER — Other Ambulatory Visit: Payer: Medicare (Managed Care)

## 2021-11-10 DIAGNOSIS — K912 Postsurgical malabsorption, not elsewhere classified: Secondary | ICD-10-CM

## 2021-11-10 DIAGNOSIS — K909 Intestinal malabsorption, unspecified: Secondary | ICD-10-CM

## 2021-11-14 LAB — ZINC: Zinc: 55 ug/dL — ABNORMAL LOW (ref 60–130)

## 2021-11-29 ENCOUNTER — Encounter: Payer: Medicare (Managed Care) | Attending: Internal Medicine | Admitting: Skilled Nursing Facility1

## 2021-11-29 DIAGNOSIS — Z6841 Body Mass Index (BMI) 40.0 and over, adult: Secondary | ICD-10-CM | POA: Insufficient documentation

## 2021-11-29 DIAGNOSIS — Z713 Dietary counseling and surveillance: Secondary | ICD-10-CM | POA: Diagnosis not present

## 2021-11-29 DIAGNOSIS — K911 Postgastric surgery syndromes: Secondary | ICD-10-CM | POA: Diagnosis present

## 2021-11-29 DIAGNOSIS — K50019 Crohn's disease of small intestine with unspecified complications: Secondary | ICD-10-CM | POA: Diagnosis present

## 2021-11-29 NOTE — Progress Notes (Signed)
Medical Nutrition Therapy   Primary concerns today (original appt): sick all the time from eating; need a diet to keep from getting sick  Referral diagnosis: K91.01 Preferred learning style: no preference indicated Learning readiness: contemplative   NUTRITION ASSESSMENT   Anthropometrics  Wt: 364 pounds   Body Composition Scale 10/31/2021  Current Body Weight 359.7  Total Body Fat % 40.3  Visceral Fat 38  Fat-Free Mass % 59.6   Total Body Water % 40.6  Muscle-Mass lbs 67.9  BMI 48.9  Body Fat Displacement          Torso  lbs 18.0         Left Leg  lbs 18.0         Right Leg  lbs 9.0         Left Arm  lbs 9.0         Right Arm   lbs     Clinical Medical Hx: GERD, Sleep Apnea, Hypertension, Kidney Disease, Thyroid disease, Chrones disease, post surgical dumping Medications: Olesartan, prochlorperazine, Zofran-odt, Compazine, Maxalt-mlt, Calan-SR, Flexeril, Cardura, Pepcid Labs: thiamine 8, zinc 55, b12 494, vitamin D 7.91 Notable Signs/Symptoms:   Lifestyle & Dietary Hx  Pt arrives with his supportive wife.    Pt states his vomiting has gotten significantly better and no longer throws everything up. Pt states he did start a once weekly vitamin D and his Zinc did come back a little low. Pt states the chewable multi makes him feel nausea even with eating food lasting a couple hours so thinking he will try the capsule.  Pt states he past 2 kidney stones.  Pt state she is having more energy and trying to walk more.   Pt states in the last 30 days he has only vomited 2 times stating he has to lay down on his left side.   Pt states his soda intake has reduced but is drinking more juice. Pt state she does have diarrhea But less than before.   Pt states the alkaline water has helped.  Pt states he does not eat anything until 2-3 pm waking around 9am.   Supplements: B12 injections (wife administers), bariatric advantage ultra solo   Current average weekly physical activity:  no activity  24-Hr Dietary Recall First Meal: skip Snack:  Second Meal 2-3: [place] or bologna sandwich or pot pie + chips Snack: no Third Meal 7-8pm: country fried steak + yellow rice  Snack: short bread cookies, sunflower seeds, popcorn throughout the night in bed  Beverages: sprite, Gatorade, flavored seltzer water, dr pepper, a little water (makes him sick) (soda 20 ounces every 3-4 days), fruit juice 20-30 ounces, alkaline water   NUTRITION DIAGNOSIS  NI-5.9.1 Inadequate vitamin intake As related to inadequate nutrient intake and rerouted intestines.  As evidenced by low Vit D values.and 24 hr recall   NUTRITION INTERVENTION  Nutrition education (E-1) on the following topics:  Micro nutrient deficiency risk Digestion/absorption of food and nutrients Importance of avoiding eating in the night for proper rest  Handouts Previous Provided Include  Multi Vitamin and Calcium supplement guide  Learning Style & Readiness for Change Teaching method utilized: Visual & Auditory  Demonstrated degree of understanding via: Teach Back  Barriers to learning/adherence to lifestyle change: fear of getting sick  Goals Established by Pt Continue: Multi Vitamin and Calcium Ask PCP about Vitamin D supplement (complete) Retake of vitamin labs: thiamin, vit D, B12 and if insurance covers and PCP agrees, folate and zinc. (Complete) Continue:  Increase water intake Continue: Eat tolerable nutrient dense foods every 3-5 hours NEW: Avoid eating past 8pm NEW: avoid drinking caloric beverages such as juice    MONITORING & EVALUATION Dietary intake and tolerable foods, labs, adherence to goals  Next Steps  Patient is to return 6 weeks.

## 2021-12-02 ENCOUNTER — Encounter: Payer: Self-pay | Admitting: Internal Medicine

## 2021-12-02 DIAGNOSIS — R6339 Other feeding difficulties: Secondary | ICD-10-CM | POA: Insufficient documentation

## 2021-12-21 ENCOUNTER — Telehealth: Payer: Self-pay

## 2021-12-21 NOTE — Telephone Encounter (Signed)
Will contact [name].

## 2021-12-21 NOTE — Telephone Encounter (Signed)
Message was sent

## 2021-12-21 NOTE — Telephone Encounter (Signed)
Patient would need peer to peer for FMLA or Disability the RN was unsure. DR gessner would like a call at [phone] as soon as you have time

## 2021-12-26 NOTE — Telephone Encounter (Signed)
The Registered nurse Tacey Ruiz called twice and gave me the number listed and stated Dr. Elberta Leatherwood wanted to do a peer to peer and for you to call. I repeated the number and stated that it was correct. If she return the call I will try to get a different #

## 2021-12-26 NOTE — Telephone Encounter (Signed)
I tried that number - not Dr. Leone Payor. Sent him a message and no known reason for call. Am I supposed to be calling other individual to discuss FMLA? Thanks.

## 2022-01-17 ENCOUNTER — Encounter: Payer: Medicare (Managed Care) | Attending: Internal Medicine | Admitting: Skilled Nursing Facility1

## 2022-01-17 DIAGNOSIS — R6339 Other feeding difficulties: Secondary | ICD-10-CM | POA: Diagnosis not present

## 2022-01-17 DIAGNOSIS — K50019 Crohn's disease of small intestine with unspecified complications: Secondary | ICD-10-CM | POA: Insufficient documentation

## 2022-01-17 DIAGNOSIS — Z6841 Body Mass Index (BMI) 40.0 and over, adult: Secondary | ICD-10-CM | POA: Insufficient documentation

## 2022-01-17 DIAGNOSIS — K911 Postgastric surgery syndromes: Secondary | ICD-10-CM | POA: Insufficient documentation

## 2022-01-17 DIAGNOSIS — Z713 Dietary counseling and surveillance: Secondary | ICD-10-CM | POA: Diagnosis not present

## 2022-01-17 NOTE — Progress Notes (Addendum)
Medical Nutrition Therapy   Primary concerns today (original appt): sick all the time from eating; need a diet to keep from getting sick  Referral diagnosis: K91.01 Preferred learning style: no preference indicated Learning readiness: contemplative   NUTRITION ASSESSMENT   Anthropometrics  Wt: pt declined  Body Composition Scale 10/31/2021  Current Body Weight 359.7  Total Body Fat % 40.3  Visceral Fat 38  Fat-Free Mass % 59.6   Total Body Water % 40.6  Muscle-Mass lbs 67.9  BMI 48.9  Body Fat Displacement          Torso  lbs 18.0         Left Leg  lbs 18.0         Right Leg  lbs 9.0         Left Arm  lbs 9.0         Right Arm   lbs     Clinical Medical Hx: GERD, Sleep Apnea, Hypertension, Kidney Disease, Thyroid disease, Chrones disease, post surgical dumping Medications: Olesartan, prochlorperazine, Zofran-odt, Compazine, Maxalt-mlt, Calan-SR, Flexeril, Cardura, Pepcid Labs: thiamine 8, zinc 55, b12 494, vitamin D 7.91 Notable Signs/Symptoms:   Lifestyle & Dietary Hx  Pt arrives with his supportive wife.   Pt states his soda intake has reduced but is drinking more juice. Pt state she does have diarrhea But less than before.   Pt states the alkaline water has helped.  Pt states he does not eat anything until 2-3 pm waking around 9am.  Pt states he stopped eating in the middle of the night.  Pt states he did start taking a bari multivitamin.  Pt states his tail bone has been hurting for a few weeks.  Pt states he Feels nauseous all day and throws up a couple times a week   Pt states he has been dumping daily.  Supplements: bari multivitamin and calcium and b12 injection   Current plan is to have bowel rest for 2 weeks and then slowly introduce soft textured food and assist him in smaller portions eaten more often throughout the day.    Current average weekly physical activity: no activity  24-Hr Dietary Recall First Meal: skip Snack:  Second Meal 2-3:  [place] or bologna sandwich or pot pie + chips Snack: no Third Meal 9pm: country fried steak + yellow rice  Snack: short bread cookies, sunflower seeds, popcorn throughout the night in bed  Beverages: sprite, Gatorade, flavored seltzer water, dr pepper, a little water (makes him sick) (soda 20 ounces every 3-4 days), fruit juice 20-30 ounces, alkaline water    NUTRITION DIAGNOSIS  NI-5.9.1 Inadequate vitamin intake As related to inadequate nutrient intake and rerouted intestines.  As evidenced by low Vit D values.and 24 hr recall   NUTRITION INTERVENTION  Nutrition education (E-1) on the following topics:  Micro nutrient deficiency risk Digestion/absorption of food and nutrients Importance of avoiding eating in the night for proper rest  Handouts Previous Provided Include  Multi Vitamin and Calcium supplement guide  Learning Style & Readiness for Change Teaching method utilized: Visual & Auditory  Demonstrated degree of understanding via: Teach Back  Barriers to learning/adherence to lifestyle change: fear of getting sick  Goals Established by Pt Continue: Multi Vitamin and Calcium Ask PCP about Vitamin D supplement (complete) Retake of vitamin labs: thiamin, vit D, B12 and if insurance covers and PCP agrees, folate and zinc. (Complete) Continue: Increase water intake Continue: Eat tolerable nutrient dense foods every 3-5 hours continue: Avoid eating past  8pm continue: avoid drinking caloric beverages such as juice NEW: only liquid diet for 2 weeks for bowel rest due to chronic emesis, dumping, diarrhea  NEW: Chicken broth or vegetable broth or beef broth and add unflavored protein powder  NEW: 2 20 ounces of alkaline water per day NEW: Aim for 80 grams of protein per day NEW: Sugar free jello is okay NEW: Do NOT eat any solid foods at all for the next 7 days    MONITORING & EVALUATION Dietary intake and tolerable foods, labs, adherence to goals  Next Steps  Patient is to  return in 2 weeks.

## 2022-01-21 ENCOUNTER — Other Ambulatory Visit: Payer: Self-pay | Admitting: Internal Medicine

## 2022-01-21 DIAGNOSIS — E559 Vitamin D deficiency, unspecified: Secondary | ICD-10-CM

## 2022-01-28 ENCOUNTER — Other Ambulatory Visit: Payer: Self-pay | Admitting: Family Medicine

## 2022-01-28 DIAGNOSIS — R351 Nocturia: Secondary | ICD-10-CM

## 2022-01-28 DIAGNOSIS — I10 Essential (primary) hypertension: Secondary | ICD-10-CM

## 2022-03-31 ENCOUNTER — Ambulatory Visit (INDEPENDENT_AMBULATORY_CARE_PROVIDER_SITE_OTHER): Payer: Medicare (Managed Care) | Admitting: Family Medicine

## 2022-03-31 ENCOUNTER — Encounter: Payer: Self-pay | Admitting: Family Medicine

## 2022-03-31 VITALS — BP 138/88 | HR 82 | Temp 98.1°F | Ht 72.0 in | Wt 359.8 lb

## 2022-03-31 DIAGNOSIS — R7303 Prediabetes: Secondary | ICD-10-CM | POA: Diagnosis not present

## 2022-03-31 DIAGNOSIS — E785 Hyperlipidemia, unspecified: Secondary | ICD-10-CM

## 2022-03-31 DIAGNOSIS — I10 Essential (primary) hypertension: Secondary | ICD-10-CM

## 2022-03-31 DIAGNOSIS — E559 Vitamin D deficiency, unspecified: Secondary | ICD-10-CM | POA: Diagnosis not present

## 2022-03-31 DIAGNOSIS — R351 Nocturia: Secondary | ICD-10-CM

## 2022-03-31 DIAGNOSIS — Z23 Encounter for immunization: Secondary | ICD-10-CM | POA: Diagnosis not present

## 2022-03-31 LAB — COMPREHENSIVE METABOLIC PANEL
ALT: 38 U/L (ref 0–53)
AST: 33 U/L (ref 0–37)
Albumin: 3.9 g/dL (ref 3.5–5.2)
Alkaline Phosphatase: 116 U/L (ref 39–117)
BUN: 10 mg/dL (ref 6–23)
CO2: 26 mEq/L (ref 19–32)
Calcium: 9.1 mg/dL (ref 8.4–10.5)
Chloride: 105 mEq/L (ref 96–112)
Creatinine, Ser: 1.11 mg/dL (ref 0.40–1.50)
GFR: 77.58 mL/min (ref >60.00)
Glucose, Bld: 91 mg/dL (ref 70–99)
Potassium: 4 mEq/L (ref 3.5–5.1)
Sodium: 140 mEq/L (ref 135–145)
Total Bilirubin: 0.4 mg/dL (ref 0.2–1.2)
Total Protein: 7.8 g/dL (ref 6.0–8.3)

## 2022-03-31 LAB — LIPID PANEL
Cholesterol: 161 mg/dL (ref 0–200)
HDL: 32.2 mg/dL — ABNORMAL LOW (ref >39.00)
LDL Cholesterol: 105 mg/dL — ABNORMAL HIGH (ref 0–99)
NonHDL: 129.26
Total CHOL/HDL Ratio: 5
Triglycerides: 123 mg/dL (ref 0.0–149.0)
VLDL: 24.6 mg/dL (ref 0.0–40.0)

## 2022-03-31 LAB — VITAMIN D 25 HYDROXY (VIT D DEFICIENCY, FRACTURES): VITD: 22.09 ng/mL — ABNORMAL LOW (ref 30.00–100.00)

## 2022-03-31 LAB — HEMOGLOBIN A1C: Hgb A1c MFr Bld: 5.9 % (ref 4.6–6.5)

## 2022-03-31 MED ORDER — OLMESARTAN MEDOXOMIL 20 MG PO TABS: 20.0000 mg | ORAL_TABLET | Freq: Every day | ORAL | 1 refills | Status: DC

## 2022-03-31 MED ORDER — DOXAZOSIN MESYLATE 8 MG PO TABS: 8.0000 mg | ORAL_TABLET | Freq: Every day | ORAL | 1 refills | Status: DC

## 2022-03-31 MED ORDER — VERAPAMIL HCL ER 180 MG PO TBCR: 360.0000 mg | EXTENDED_RELEASE_TABLET | Freq: Every day | ORAL | 1 refills | Status: DC

## 2022-03-31 NOTE — Progress Notes (Signed)
Subjective:  Patient ID: Craig Clark., male    DOB: 03-12-1971  Age: 51 y.o. MRN: [id number]  CC:  Chief Complaint  Patient presents with   Hyperlipidemia    Pt states all is well   Hypertension   Depression    PHQ9 - 5    HPI Craig Clark. presents for   Hypertension: Treated with doxazosin, olmesartan, verapamil.  No mew med side effects.  Home readings 120-130/75-80.  Took meds late this am.  BP Readings from Last 3 Encounters:  03/31/22 138/88  09/28/21 118/76  08/04/21 130/90   Lab Results  Component Value Date   CREATININE 1.06 09/28/2021   Prediabetes: Recommended decrease soda at last visit in March.  Increased exercise, activity also recommended.  Weight has decreased by 8 pounds since that time.  He is followed by nutritionist with history of Crohn's disease and postsurgical dumping syndrome. Protein drinks and decreased calories., workin with nutritionist. Exercise limited as tired d/t diet and GI issues. Has been improving. On bariatric supplement - ca, vit D, Mg, iron.   Lab Results  Component Value Date   HGBA1C 6.0 09/28/2021   Wt Readings from Last 3 Encounters:  03/31/22 (!) 359 lb 12.8 oz (163.2 kg)  11/29/21 (!) 364 lb (165.1 kg)  10/31/21 (!) 359 lb 11.2 oz (163.2 kg)   Hyperlipidemia: Borderline ascvd risk score prior. No meds.   The 10-year ASCVD risk score (Arnett DK, et al., 2019) is: 11.4%   Values used to calculate the score:     Age: 30 years     Sex: Male     Is Non-Hispanic African American: Yes     Diabetic: No     Tobacco smoker: No     Systolic Blood Pressure: 110 mmHg     Is BP treated: Yes     HDL Cholesterol: 30 mg/dL     Total Cholesterol: 183 mg/dL   Lab Results  Component Value Date   CHOL 183 09/28/2021   HDL 30.00 (L) 09/28/2021   LDLCALC 121 (H) 09/28/2021   TRIG 162.0 (H) 09/28/2021   CHOLHDL 6 09/28/2021   Lab Results  Component Value Date   ALT 34 09/28/2021   AST 36 09/28/2021   ALKPHOS 106  09/28/2021   BILITOT 0.5 09/28/2021    Vitamin D deficiency: Last vitamin D Lab Results  Component Value Date   VD25OH 7.91 (L) 11/02/2021  Prior 50k units per week - stopped 1 month ago. Unknown dose of current supplement.  Needs levels rechecked.   B12, thiamine, folate looked ok in May.   HM: Shingrix recommended at pharmacy.  Side effects discussed.  Covid booster recommended at pharmacy.    Denies depression or anhedonia.  Fatigue with appetite due to gastrointestinal issues.    03/31/2022   11:15 AM 09/28/2021    2:11 PM 03/30/2021   10:14 AM 09/08/2020    1:54 PM 03/10/2020    3:55 PM  Depression screen PHQ 2/9  Decreased Interest 0 0 0 0 0  Down, Depressed, Hopeless 0 0 0 0 0  PHQ - 2 Score 0 0 0 0 0  Altered sleeping 0 0     Tired, decreased energy 2 0     Change in appetite 3 0     Feeling bad or failure about yourself  0 0     Trouble concentrating 0 0     Moving slowly or fidgety/restless 0 0     Suicidal  thoughts 0 0     PHQ-9 Score 5 0        History Patient Active Problem List   Diagnosis Date Noted   Feeding problems 12/02/2021   Abdominal migraine, not intractable ? 03/21/2019   Postsurgical dumping syndrome 02/14/2019   S/P functional endoscopic sinus surgery 11/27/2018   Abdominal pain, epigastric    Angioedema 04/19/2018   Intractable vomiting with nausea    Cigarette nicotine dependence without complication 05/02/5944   Shifting sleep-work schedule, affecting sleep 05/15/2016   Sleep deprivation 05/15/2016   Sleep related hypoventilation in conditions classified elsewhere 05/15/2016   Sleep related headaches 05/15/2016   Morbid obesity ([place]) 09/19/2013   Diarrhea 07/10/2013   Colloid thyroid nodule 04/04/2013   Hypertension 11/26/2012   Gastroparesis??? 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   History of migraine headaches    HTN (hypertension)    Kidney stone 09/20/2012   Migraine    Obesity    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    Pneumonia 02/27/2014   ED   Postsurgical dumping syndrome 02/14/2019   Sleep apnea    has cpap- does not wear    Past Surgical History:  Procedure Laterality Date   BALLOON DILATION N/A 12/06/2017   Procedure: BALLOON DILATION;  Surgeon: Gatha Mayer, MD;  Location: [facility];  Service: Endoscopy;  Laterality: N/A;   BIOPSY  06/21/2018   Procedure: BIOPSY;  Surgeon: Gatha Mayer, MD;  Location: [facility];  Service: Endoscopy;;   CHOLECYSTECTOMY  2011   [place]   CIRCUMCISION N/A 12/09/2018   Procedure: CIRCUMCISION ADULT;  Surgeon: Lucas Mallow, MD;  Location: [facility];  Service: Urology;  Laterality: N/A;   COLONOSCOPY  07/26/11   Crohn's colitis, ileitis   ESOPHAGOGASTRODUODENOSCOPY  05/04/11, 07/26/11   granulomatous gastritis - Crohn's   ESOPHAGOGASTRODUODENOSCOPY (EGD) WITH PROPOFOL N/A 03/28/2017   Procedure: ESOPHAGOGASTRODUODENOSCOPY (EGD) WITH PROPOFOL  with balloon dilation of duodenum;  Surgeon: [name], MD;  Location: [facility];  Service: Gastroenterology;  Laterality: N/A;   ESOPHAGOGASTRODUODENOSCOPY (EGD) WITH PROPOFOL N/A 12/06/2017   Procedure: ESOPHAGOGASTRODUODENOSCOPY (EGD) WITH PROPOFOL;  Surgeon: Gatha Mayer, MD;  Location: [facility];  Service: Endoscopy;  Laterality: N/A;   ESOPHAGOGASTRODUODENOSCOPY (EGD) WITH PROPOFOL N/A 06/21/2018    Procedure: ESOPHAGOGASTRODUODENOSCOPY (EGD) WITH PROPOFOL;  Surgeon: Gatha Mayer, MD;  Location: [facility];  Service: Endoscopy;  Laterality: N/A;   ETHMOIDECTOMY N/A 11/27/2018   Procedure: ENDOSCOPIC TOTAL ETHMOIDECTOMY;  Surgeon: Leta Baptist, MD;  Location: [facility];  Service: ENT;  Laterality: N/A;   FLEXIBLE SIGMOIDOSCOPY     GASTROJEJUNOSTOMY  01/2018   MULTIPLE TOOTH EXTRACTIONS  2005   NASAL SEPTOPLASTY W/ TURBINOPLASTY Bilateral 11/27/2018   Procedure: NASAL SEPTOPLASTY WITH BILATERAL TURBINATE REDUCTION;  Surgeon: Leta Baptist, MD;  Location: [facility];  Service: ENT;  Laterality:  Bilateral;   SHOULDER SURGERY     right   SINUS ENDO WITH FUSION N/A 11/27/2018   Procedure: SINUS ENDO WITH FUSION;  Surgeon: Leta Baptist, MD;  Location: [facility];  Service: ENT;  Laterality: N/A;   UPPER GASTROINTESTINAL ENDOSCOPY     VASECTOMY     bilateral w/lysis of penile adhesions   Allergies  Allergen Reactions   Zithromax [Azithromycin] Hypertension   Lisinopril Swelling    SWELLING REACTION UNSPECIFIED    Tessalon Perles [Benzonatate]     hypertension   Prior to Admission medications   Medication Sig Start Date End Date Taking? Authorizing Provider  cyanocobalamin (,VITAMIN B-12,) 1000 MCG/ML injection INJECT 1 ML (1,000 MCG TOTAL) INTO THE MUSCLE ONCE FOR 1 DOSE. PLEASE PROVIDE 3 CC SYRINGE #12, 1 1/2 [facility] 23 GAUGE NEEDLE #12 10/26/21  Yes Gatha Mayer, MD  cyclobenzaprine (FLEXERIL) 10 MG tablet Take 1 tablet (10 mg total) by mouth 3 (three) times daily as needed for muscle spasms. 04/02/20  Yes Raylene Everts, MD  doxazosin (CARDURA) 8 MG tablet TAKE ONE TABLET BY MOUTH AT BEDTIME 01/30/22  Yes [name], MD  olmesartan (BENICAR) 20 MG tablet TAKE ONE TABLET BY MOUTH EVERY DAY 01/30/22  Yes [name], MD  rizatriptan (MAXALT-MLT) 10 MG disintegrating tablet TAKE 1 TABLET 3 TIME A DA Y AS NEEDED FOR MIGRAINE 01/26/20  Yes Gatha Mayer, MD  verapamil (CALAN-SR) 180 MG CR tablet TAKE  TWO TABLETS BY MOUTH DAILY 01/30/22  Yes [name], MD  Vitamin D, Ergocalciferol, 50000 units CAPS Take 50,000 Units by mouth every 7 (seven) days. 11/04/21  Yes Gatha Mayer, MD  famotidine (PEPCID) 40 MG tablet Take 1 tablet (40 mg total) by mouth daily. Patient not taking: Reported on 03/31/2022 03/05/20   Gatha Mayer, MD  ondansetron (ZOFRAN-ODT) 4 MG disintegrating tablet Take 1 tablet (4 mg total) by mouth every 8 (eight) hours as needed for nausea or vomiting. Patient not taking: Reported on 03/31/2022 12/10/20   Gatha Mayer, MD  prochlorperazine (COMPAZINE) 25 MG suppository Place 1 suppository (25 mg total) rectally every 12 (twelve) hours as needed for nausea or vomiting. Patient not taking: Reported on 03/31/2022 10/20/20   Gatha Mayer, MD   Social History   Socioeconomic History   Marital status: Married    Spouse name: Not on file   Number of children: 3   Years of education: Some college   Highest education level: Not on file  Occupational History   Occupation: Environmental health practitioner: [employer]  Tobacco Use   Smoking status: Former    Packs/day: 0.75    Years: 21.00    Total pack years: 15.75    Types: Cigarettes    Quit date: 12/01/2017    Years since quitting: 4.3   Smokeless tobacco: Former    Types: Snuff   Tobacco comments:    Counseling sheet given in exam room   Vaping Use   Vaping Use: Never used  Substance and Sexual Activity   Alcohol use: Not Currently   Drug use: No   Sexual activity: Yes    Partners: Female  Other Topics Concern   Not on file  Social History Narrative   Married. Education: [employer].    Lives at home w/ his wife and Engineer, site for [employer] -on disability    Right-handed   Caffeine: 3 sodas per day   Occasional alcohol no drugs  Former smoker former snuff user   Social Determinants of Radio broadcast assistant Strain: Not on file  Food Insecurity: Not on file  Transportation Needs: Not on file  Physical  Activity: Not on file  Stress: Not on file  Social Connections: Not on file  Intimate Partner Violence: Not on file    Review of Systems  Constitutional:  Negative for fatigue and unexpected weight change.  Eyes:  Negative for visual disturbance.  Respiratory:  Negative for cough, chest tightness and shortness of breath.   Cardiovascular:  Negative for chest pain, palpitations and leg swelling.  Gastrointestinal:  Positive for abdominal pain (chronic). Negative for blood in stool.  Neurological:  Negative for dizziness, light-headedness and headaches.     Objective:   Vitals:   03/31/22 1119  BP: 138/88  Pulse: 82  Temp: 98.1 F (36.7 C)  SpO2: 100%  Weight: (!) 359 lb 12.8 oz (163.2 kg)  Height: 6' (1.829 m)     Physical Exam Vitals reviewed.  Constitutional:      Appearance: He is well-developed. He is obese.  HENT:     Head: Normocephalic and atraumatic.  Neck:     Vascular: No carotid bruit or JVD.  Cardiovascular:     Rate and Rhythm: Normal rate and regular rhythm.     Heart sounds: Normal heart sounds. No murmur heard. Pulmonary:     Effort: Pulmonary effort is normal.     Breath sounds: Normal breath sounds. No rales.  Abdominal:     Tenderness: There is abdominal tenderness (Diffuse without 1 focal location.).  Musculoskeletal:     Right lower leg: No edema.     Left lower leg: No edema.  Skin:    General: Skin is warm and dry.  Neurological:     Mental Status: He is alert and oriented to person, place, and time.  Psychiatric:        Mood and Affect: Mood normal.     Assessment & Plan:  Jazen Spraggins. is a 51 y.o. male . Essential hypertension - Plan: Comprehensive metabolic panel  -Borderline, better on home readings.  Goal of 130/80 or below discussed with patient.  Continue same regimen for now.  Check labs with medication adjustment accordingly.  Need for influenza vaccination - Plan: Flu Vaccine QUAD 6+ mos PF IM (Fluarix Quad  PF)  Prediabetes - Plan: Hemoglobin A1c  -Weight has improved as above.  Continue follow-up with nutritionist.    Hyperlipidemia, unspecified hyperlipidemia type - Plan: Lipid panel  -Slight elevated ASCVD risk score based on current readings, updated lipids ordered to decide on medication need.  Continue to watch weight, has improved since last visit.  Vitamin D deficiency - Plan: Vitamin D (25 hydroxy) Concern for prior deficiencies with his gastric issues noted, vitamin D was low, other labs looked okay.  Repeat vitamin D assessment, may need to know what dosing of vitamin D is in his home supplement, and likely may need repeat prescription dosing.  No orders of the defined types were placed in this encounter.  Patient Instructions  No medication changes today.  Depending on cholesterol levels we can discuss if another medication is needed.  Let me know what dose of vitamin D is in your current supplement but I will check the levels today to decide if the prescription strength will be needed again.  Follow-up in 6 months for physical but let me know if there are questions in the meantime.  Flu shot  given today but shingles vaccine will need to be given at your pharmacy as well as a COVID booster. Take care.      Signed,   Merri Ray, MD [place] 03/31/22 12:59 PM

## 2022-03-31 NOTE — Patient Instructions (Addendum)
No medication changes today.  Depending on cholesterol levels we can discuss if another medication is needed.  Let me know what dose of vitamin D is in your current supplement but I will check the levels today to decide if the prescription strength will be needed again.  Follow-up in 6 months for physical but let me know if there are questions in the meantime.  Flu shot given today but shingles vaccine will need to be given at your pharmacy as well as a COVID booster. Take care.

## 2022-04-14 ENCOUNTER — Telehealth: Payer: Self-pay

## 2022-04-14 ENCOUNTER — Other Ambulatory Visit: Payer: Self-pay | Admitting: Family Medicine

## 2022-04-14 DIAGNOSIS — R351 Nocturia: Secondary | ICD-10-CM

## 2022-04-14 DIAGNOSIS — I10 Essential (primary) hypertension: Secondary | ICD-10-CM

## 2022-04-14 NOTE — Telephone Encounter (Signed)
Added Dr Carlota Raspberry as PCP

## 2022-04-14 NOTE — Telephone Encounter (Signed)
Can we put Dr Carlota Raspberry as pt's PCP . He has been with Dr Carlota Raspberry for 10 years

## 2022-06-30 ENCOUNTER — Other Ambulatory Visit: Payer: Self-pay | Admitting: Family Medicine

## 2022-06-30 DIAGNOSIS — I10 Essential (primary) hypertension: Secondary | ICD-10-CM

## 2022-08-04 ENCOUNTER — Encounter (HOSPITAL_COMMUNITY): Payer: Self-pay | Admitting: Emergency Medicine

## 2022-08-04 ENCOUNTER — Ambulatory Visit (HOSPITAL_COMMUNITY)
Admission: EM | Admit: 2022-08-04 | Discharge: 2022-08-04 | Disposition: A | Discharge status: Home / Self Care | Payer: Medicare Other | Attending: Emergency Medicine | Admitting: Emergency Medicine

## 2022-08-04 DIAGNOSIS — R051 Acute cough: Secondary | ICD-10-CM

## 2022-08-04 DIAGNOSIS — J209 Acute bronchitis, unspecified: Secondary | ICD-10-CM

## 2022-08-04 MED ORDER — ALBUTEROL SULFATE HFA 108 (90 BASE) MCG/ACT IN AERS: 1.0000 | INHALATION_SPRAY | Freq: Four times a day (QID) | RESPIRATORY_TRACT | 0 refills | Status: DC | PRN

## 2022-08-04 MED ORDER — PREDNISONE 20 MG PO TABS: 40.0000 mg | ORAL_TABLET | Freq: Every day | ORAL | 0 refills | Status: AC

## 2022-08-04 MED ORDER — PROMETHAZINE-DM 6.25-15 MG/5ML PO SYRP: 5.0000 mL | ORAL_SOLUTION | Freq: Two times a day (BID) | ORAL | 0 refills | Status: AC | PRN

## 2022-08-04 NOTE — Discharge Instructions (Addendum)
You were diagnosed with bronchitis today.  Since your symptoms have been ongoing for a week we decided to defer COVID and flu testing.  Please use the albuterol inhaler 1 to 2 puffs every 6 hours as needed for shortness of breath.  Please take the prednisone with breakfast.  This might increase your appetite.  Please take promethazine cough syrup as needed up to twice daily.  This can be sedating please do not drink or drive on this medication.  If your symptoms do not improve within a week please follow-up with your primary care.  If your symptoms worsen, you develop a fever, or chest pain please seek care immediately.

## 2022-08-04 NOTE — ED Provider Notes (Signed)
[place]    CSN: [id number] Arrival date & time: 08/04/22  7829      History   Chief Complaint Chief Complaint  Patient presents with   Cough    HPI Craig Liew. is a 52 y.o. male.   Patient presents with 1 week of dry hacking cough.  At the beginning of his illness he had cough body aches hot and cold chills.  Reports last fever was Sunday.  Reports cough kept him up last night.  He ended up vomiting due to his cough.  Denies chest pain, shortness of breath, and diarrhea.  Has been taking Tylenol, Robitussin and Mucinex with minor relief.  Reports history of bronchitis.  Has not taken blood pressure medication yet today.   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 03/21/2019   Postsurgical dumping syndrome 02/14/2019   S/P functional endoscopic sinus surgery 11/27/2018   Abdominal pain, epigastric    Angioedema 04/19/2018   Intractable vomiting with nausea    Cigarette nicotine dependence without complication 56/21/3086   Shifting sleep-work schedule, affecting sleep 05/15/2016    Sleep deprivation 05/15/2016   Sleep related hypoventilation in conditions classified elsewhere 05/15/2016   Sleep related headaches 05/15/2016   Morbid obesity ([place]) 09/19/2013   Diarrhea 07/10/2013   Colloid thyroid nodule 04/04/2013   Hypertension 11/26/2012   Gastroparesis??? 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[place]) 05/17/2011   GERD (gastroesophageal reflux disease) 04/21/2011    Past Surgical History:  Procedure Laterality Date   BALLOON DILATION N/A 12/06/2017   Procedure: BALLOON DILATION;  Surgeon: Gatha Mayer, MD;  Location: [facility];  Service: Endoscopy;  Laterality: N/A;   BIOPSY  06/21/2018   Procedure: BIOPSY;  Surgeon: Gatha Mayer, MD;  Location: [facility];  Service: Endoscopy;;   CHOLECYSTECTOMY  2011   [place]   CIRCUMCISION N/A 12/09/2018   Procedure: CIRCUMCISION ADULT;  Surgeon: Lucas Mallow, MD;  Location: [facility];  Service: Urology;  Laterality: N/A;   COLONOSCOPY  07/26/11   Crohn's colitis, ileitis   ESOPHAGOGASTRODUODENOSCOPY  05/04/11, 07/26/11   granulomatous gastritis - Crohn's   ESOPHAGOGASTRODUODENOSCOPY (EGD) WITH PROPOFOL N/A 03/28/2017   Procedure: ESOPHAGOGASTRODUODENOSCOPY (EGD) WITH PROPOFOL  with balloon dilation of duodenum;  Surgeon: [name], MD;  Location: [facility];  Service: Gastroenterology;  Laterality: N/A;  ESOPHAGOGASTRODUODENOSCOPY (EGD) WITH PROPOFOL N/A 12/06/2017   Procedure: ESOPHAGOGASTRODUODENOSCOPY (EGD) WITH PROPOFOL;  Surgeon: Gatha Mayer, MD;  Location: [facility];  Service: Endoscopy;  Laterality: N/A;   ESOPHAGOGASTRODUODENOSCOPY (EGD) WITH PROPOFOL N/A 06/21/2018   Procedure: ESOPHAGOGASTRODUODENOSCOPY (EGD) WITH PROPOFOL;  Surgeon: Gatha Mayer, MD;  Location: [facility];  Service: Endoscopy;  Laterality: N/A;   ETHMOIDECTOMY N/A 11/27/2018   Procedure: ENDOSCOPIC TOTAL  ETHMOIDECTOMY;  Surgeon: Leta Baptist, MD;  Location: [facility];  Service: ENT;  Laterality: N/A;   FLEXIBLE SIGMOIDOSCOPY     GASTROJEJUNOSTOMY  01/2018   MULTIPLE TOOTH EXTRACTIONS  2005   NASAL SEPTOPLASTY W/ TURBINOPLASTY Bilateral 11/27/2018   Procedure: NASAL SEPTOPLASTY WITH BILATERAL TURBINATE REDUCTION;  Surgeon: Leta Baptist, MD;  Location: [facility];  Service: ENT;  Laterality: Bilateral;   SHOULDER SURGERY     right   SINUS ENDO WITH FUSION N/A 11/27/2018   Procedure: SINUS ENDO WITH FUSION;  Surgeon: Leta Baptist, MD;  Location: [facility];  Service: ENT;  Laterality: N/A;   UPPER GASTROINTESTINAL ENDOSCOPY     VASECTOMY     bilateral w/lysis of penile adhesions       Home Medications    Prior to Admission medications   Medication Sig Start Date End Date Taking? Authorizing Provider  albuterol (VENTOLIN HFA) 108 (90 Base) MCG/ACT inhaler Inhale 1-2 puffs into the lungs every 6 (six) hours as needed for wheezing or shortness of breath. 08/04/22  Yes Louretta Shorten, [place], FNP  cyanocobalamin (,VITAMIN B-12,) 1000 MCG/ML injection INJECT 1 ML (1,000 MCG TOTAL) INTO THE MUSCLE ONCE FOR 1 DOSE. PLEASE PROVIDE 3 CC SYRINGE #12, 1 1/2 [facility] 23 GAUGE NEEDLE #12 10/26/21   Gatha Mayer, MD  doxazosin (CARDURA) 8 MG tablet TAKE ONE TABLET BY MOUTH AT BEDTIME 04/14/22   [name], MD  olmesartan (BENICAR) 20 MG tablet TAKE ONE TABLET BY MOUTH EVERY DAY 06/30/22   [name], MD  predniSONE (DELTASONE) 20 MG tablet Take 2 tablets (40 mg total) by mouth daily with breakfast for 10 days. 08/04/22 08/14/22 Yes Louretta Shorten, [place], FNP  promethazine-dextromethorphan (PROMETHAZINE-DM) 6.25-15 MG/5ML syrup Take 5 mLs by mouth 2 (two) times daily as needed for up to 5 days for cough. 08/04/22 08/09/22 Yes Louretta Shorten, [place], FNP  rizatriptan (MAXALT-MLT) 10 MG disintegrating tablet TAKE 1 TABLET 3 TIME A DA Y AS NEEDED FOR MIGRAINE 01/26/20   Gatha Mayer, MD  verapamil (CALAN-SR) 180 MG CR tablet TAKE TWO  TABLETS BY MOUTH EVERY DAY 06/30/22   [name], MD  Vitamin D, Ergocalciferol, 50000 units CAPS Take 50,000 Units by mouth every 7 (seven) days. 11/04/21   Gatha Mayer, MD    Family History Family History  Problem Relation Age of Onset   Colon polyps Father    Diabetes Father    Hypertension Father    Hypertension Mother    Asthma Mother    Heart disease Maternal Grandmother    Colon cancer Neg Hx    Rectal cancer Neg Hx    Stomach cancer Neg Hx    Esophageal cancer Neg Hx     Social History Social History   Tobacco Use   Smoking status: Former    Packs/day: 0.75    Years: 21.00    Total pack years: 15.75    Types: Cigarettes    Quit date: 12/01/2017    Years since quitting: 4.6   Smokeless tobacco: Former    Types: Snuff  Tobacco comments:    Counseling sheet given in exam room   Vaping Use   Vaping Use: Never used  Substance Use Topics   Alcohol use: Not Currently   Drug use: No     Allergies   Zithromax [azithromycin], Lisinopril, and Tessalon perles [benzonatate]   Review of Systems Review of Systems  Constitutional:  Negative for chills, diaphoresis, fatigue and fever.  HENT:  Positive for sore throat. Negative for congestion, sinus pressure and sinus pain.   Respiratory:  Positive for cough.   Cardiovascular:  Negative for chest pain.  Gastrointestinal:  Positive for abdominal pain, diarrhea and vomiting.  Skin:  Negative for color change and pallor.     Physical Exam Triage Vital Signs ED Triage Vitals  Enc Vitals Group     BP 08/04/22 0830 (!) 166/116     Pulse Rate 08/04/22 0830 94     Resp 08/04/22 0830 20     Temp 08/04/22 0830 98 F (36.7 C)     Temp src --      SpO2 08/04/22 0830 98 %     Weight --      Height --      Head Circumference --      Peak Flow --      Pain Score 08/04/22 0827 8     Pain Loc --      Pain Edu? --      Excl. in [place]? --    No data found.  Updated Vital Signs BP (!) 166/116 (BP Location: Right  Arm) Comment: no HTN meds today  Pulse 94   Temp 98 F (36.7 C)   Resp 20   SpO2 98%   Visual Acuity Right Eye Distance:   Left Eye Distance:   Bilateral Distance:    Right Eye Near:   Left Eye Near:    Bilateral Near:     Physical Exam Vitals and nursing note reviewed.  Constitutional:      General: He is not in acute distress.    Appearance: Normal appearance. He is not ill-appearing.     Comments: Pleasant male who appears stated age.  HENT:     Head: Normocephalic and atraumatic.  Cardiovascular:     Rate and Rhythm: Normal rate and regular rhythm.     Heart sounds: Normal heart sounds, S1 normal and S2 normal.  Pulmonary:     Effort: Pulmonary effort is normal.     Breath sounds: Normal breath sounds.     Comments: Lungs vesicular bilaterally. Abdominal:     General: Bowel sounds are normal. There is no distension.     Palpations: Abdomen is soft.     Tenderness: There is abdominal tenderness.     Comments: Reports right upper quadrant tenderness.  Consistent with Crohn's.  Neurological:     Mental Status: He is alert.  Psychiatric:        Attention and Perception: Attention normal.        Mood and Affect: Mood normal.        Speech: Speech normal.        Behavior: Behavior normal. Behavior is cooperative.        Thought Content: Thought content normal.        Cognition and Memory: Cognition and memory normal.        Judgment: Judgment normal.      UC Treatments / Results  Labs (all labs ordered are listed, but only abnormal results are displayed) Labs Reviewed -  No data to display  EKG   Radiology No results found.  Procedures Procedures (including critical care time)  Medications Ordered in UC Medications - No data to display  Initial Impression / Assessment and Plan / UC Course  I have reviewed the triage vital signs and the nursing notes.  Pertinent labs & imaging results that were available during my care of the patient were reviewed by  me and considered in my medical decision making (see chart for details).    Pleasant male with 1 week of nonproductive cough.  Lungs were clear to auscultation bilaterally.  Due to duration of symptoms deferred viral testing.  Patient was not able to take antihypertensives due to coughing and emesis.  Otherwise vitals stable.  Afebrile.  Low concern for pneumonia or infectious process at this point.  Discussed that this is most likely bronchitis and will treat with as needed albuterol, prednisone and cough syrup as needed.  Return precautions discussed.  Final Clinical Impressions(s) / UC Diagnoses   Final diagnoses:  Acute bronchitis, unspecified organism  Acute cough     Discharge Instructions      You were diagnosed with bronchitis today.  Since your symptoms have been ongoing for a week we decided to defer COVID and flu testing.  Please use the albuterol inhaler 1 to 2 puffs every 6 hours as needed for shortness of breath.  Please take the prednisone with breakfast.  This might increase your appetite.  Please take promethazine cough syrup as needed up to twice daily.  This can be sedating please do not drink or drive on this medication.  If your symptoms do not improve within a week please follow-up with your primary care.  If your symptoms worsen, you develop a fever, or chest pain please seek care immediately.     ED Prescriptions     Medication Sig Dispense Auth. Provider   albuterol (VENTOLIN HFA) 108 (90 Base) MCG/ACT inhaler Inhale 1-2 puffs into the lungs every 6 (six) hours as needed for wheezing or shortness of breath. 18 g Louretta Shorten, [place], [place]   predniSONE (DELTASONE) 20 MG tablet Take 2 tablets (40 mg total) by mouth daily with breakfast for 10 days. 20 tablet Louretta Shorten, [place] N, Craig Guzman   promethazine-dextromethorphan (PROMETHAZINE-DM) 6.25-15 MG/5ML syrup Take 5 mLs by mouth 2 (two) times daily as needed for up to 5 days for cough. 50 mL Kaliya Shreiner, [place] N, [place]      I  have reviewed the PDMP during this encounter.   Louretta Shorten [place] N, [place] 08/04/22 [phone]

## 2022-08-04 NOTE — ED Triage Notes (Signed)
Had cough for over a week. Reports was productive at first and now just hacking cough. Reports hx bronchitis. Unable to sleep due to cough. Today vomiting up mucous and stomach acid.  Pt has body aches.  Pt took Delsym, Mucinex, last week took Tylenol.

## 2022-08-11 ENCOUNTER — Other Ambulatory Visit: Payer: Self-pay | Admitting: Family Medicine

## 2022-08-11 DIAGNOSIS — I10 Essential (primary) hypertension: Secondary | ICD-10-CM

## 2022-08-16 ENCOUNTER — Ambulatory Visit (INDEPENDENT_AMBULATORY_CARE_PROVIDER_SITE_OTHER): Payer: Medicare Other

## 2022-08-16 VITALS — Wt 359.0 lb

## 2022-08-16 DIAGNOSIS — Z0000 Encounter for general adult medical examination without abnormal findings: Secondary | ICD-10-CM

## 2022-08-16 NOTE — Progress Notes (Signed)
I connected with  [name]. on 08/16/22 by a audio enabled telemedicine application and verified that I am speaking with the correct person using two identifiers.  Patient Location: Home  Provider Location: Home Office  I discussed the limitations of evaluation and management by telemedicine. The patient expressed understanding and agreed to proceed.   Subjective:   Craig Guzman. is a 52 y.o. male who presents for an Initial Medicare Annual Wellness Visit.  Review of Systems     Cardiac Risk Factors include: advanced age (>20mn, >>91women);male gender;obesity (BMI >30kg/m2);hypertension     Objective:    Today's Vitals   08/16/22 1030  Weight: (!) 359 lb (162.8 kg)   Body mass index is 48.69 kg/m.     08/16/2022   10:37 AM 04/02/2020   11:23 AM 12/04/2018   10:03 AM 11/27/2018    4:00 PM 11/21/2018    9:38 AM 06/21/2018    6:23 AM 04/24/2018    3:35 PM  Advanced Directives  Does Patient Have a Medical Advance Directive? No No No No No No No  Would patient like information on creating a medical advance directive? No - Patient declined No - Patient declined No - Patient declined No - Patient declined Yes (MAU/Ambulatory/Procedural Areas - Information given) No - Patient declined     Current Medications (verified) Outpatient Encounter Medications as of 08/16/2022  Medication Sig   Calcium Carb-Cholecalciferol (CALCIUM 1000 + D PO) Take by mouth.   cyanocobalamin (,VITAMIN B-12,) 1000 MCG/ML injection INJECT 1 ML (1,000 MCG TOTAL) INTO THE MUSCLE ONCE FOR 1 DOSE. PLEASE PROVIDE 3 CC SYRINGE #12, 1 1/2 INCH 23 GAUGE NEEDLE #12   doxazosin (CARDURA) 8 MG tablet TAKE ONE TABLET BY MOUTH AT BEDTIME   Multiple Vitamins-Calcium (MULTI-DAY/CALCIUM/EXTRA IRON PO) Take by mouth.   olmesartan (BENICAR) 20 MG tablet TAKE ONE TABLET BY MOUTH EVERY DAY   rizatriptan (MAXALT-MLT) 10 MG disintegrating tablet TAKE 1 TABLET 3 TIME A DA Y AS NEEDED FOR MIGRAINE   verapamil (CALAN-SR)  180 MG CR tablet TAKE TWO TABLETS BY MOUTH EVERY DAY   Vitamin D, Ergocalciferol, 50000 units CAPS Take 50,000 Units by mouth every 7 (seven) days.   albuterol (VENTOLIN HFA) 108 (90 Base) MCG/ACT inhaler Inhale 1-2 puffs into the lungs every 6 (six) hours as needed for wheezing or shortness of breath. (Patient not taking: Reported on 08/16/2022)   No facility-administered encounter medications on file as of 08/16/2022.    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   History of migraine headaches    HTN (hypertension)    Kidney stone 09/20/2012   Migraine    Obesity    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    Pneumonia 02/27/2014   ED   Postsurgical dumping syndrome 02/14/2019   Sleep apnea    has cpap- does not wear    Past Surgical History:  Procedure Laterality Date   BALLOON DILATION N/A 12/06/2017  Procedure: BALLOON DILATION;  Surgeon: Gatha Mayer, MD;  Location: [facility];  Service: Endoscopy;  Laterality: N/A;   BIOPSY  06/21/2018   Procedure: BIOPSY;  Surgeon: Gatha Mayer, MD;  Location: [facility];  Service: Endoscopy;;   CHOLECYSTECTOMY  2011   [place]   CIRCUMCISION N/A 12/09/2018   Procedure: CIRCUMCISION ADULT;  Surgeon: Lucas Mallow, MD;  Location: [facility];  Service: Urology;  Laterality: N/A;   COLONOSCOPY  07/26/11   Crohn's colitis, ileitis   ESOPHAGOGASTRODUODENOSCOPY  05/04/11, 07/26/11   granulomatous gastritis - Crohn's   ESOPHAGOGASTRODUODENOSCOPY (EGD) WITH PROPOFOL  N/A 03/28/2017   Procedure: ESOPHAGOGASTRODUODENOSCOPY (EGD) WITH PROPOFOL  with balloon dilation of duodenum;  Surgeon: [name], MD;  Location: [facility];  Service: Gastroenterology;  Laterality: N/A;   ESOPHAGOGASTRODUODENOSCOPY (EGD) WITH PROPOFOL N/A 12/06/2017   Procedure: ESOPHAGOGASTRODUODENOSCOPY (EGD) WITH PROPOFOL;  Surgeon: Gatha Mayer, MD;  Location: [facility];  Service: Endoscopy;  Laterality: N/A;   ESOPHAGOGASTRODUODENOSCOPY (EGD) WITH PROPOFOL N/A 06/21/2018   Procedure: ESOPHAGOGASTRODUODENOSCOPY (EGD) WITH PROPOFOL;  Surgeon: Gatha Mayer, MD;  Location: [facility];  Service: Endoscopy;  Laterality: N/A;   ETHMOIDECTOMY N/A 11/27/2018   Procedure: ENDOSCOPIC TOTAL ETHMOIDECTOMY;  Surgeon: Leta Baptist, MD;  Location: [facility];  Service: ENT;  Laterality: N/A;   FLEXIBLE SIGMOIDOSCOPY     GASTROJEJUNOSTOMY  01/2018   MULTIPLE TOOTH EXTRACTIONS  2005   NASAL SEPTOPLASTY W/ TURBINOPLASTY Bilateral 11/27/2018   Procedure: NASAL SEPTOPLASTY WITH BILATERAL TURBINATE REDUCTION;  Surgeon: Leta Baptist, MD;  Location: [facility];  Service: ENT;  Laterality: Bilateral;   SHOULDER SURGERY     right   SINUS ENDO WITH FUSION N/A 11/27/2018   Procedure: SINUS ENDO WITH FUSION;  Surgeon: Leta Baptist, MD;  Location: [facility];  Service: ENT;  Laterality: N/A;   UPPER GASTROINTESTINAL ENDOSCOPY     VASECTOMY     bilateral w/lysis of penile adhesions   Family History  Problem Relation Age of Onset   Colon polyps Father    Diabetes Father    Hypertension Father    Hypertension Mother    Asthma Mother    Heart disease Maternal Grandmother    Colon cancer Neg Hx    Rectal cancer Neg Hx    Stomach cancer Neg Hx    Esophageal cancer Neg Hx    Social History   Socioeconomic History   Marital status: Married    Spouse name: Not on file   Number of children: 3   Years of education: Some college   Highest education level: Not on file  Occupational History   Occupation: Lobbyist: [employer]  Tobacco Use   Smoking status: Former    Packs/day: 0.75    Years: 21.00    Total pack years: 15.75    Types: Cigarettes    Quit date: 12/01/2017    Years since quitting: 4.7   Smokeless tobacco: Former    Types: Snuff   Tobacco comments:    Counseling sheet given in exam room   Vaping Use   Vaping Use: Never used  Substance and Sexual Activity   Alcohol use: Not Currently   Drug use: No   Sexual activity: Yes    Partners: Female  Other Topics Concern   Not on file  Social History Narrative   Married. Education: [employer].    Lives at home w/ his wife and Engineer, site for [employer] -on disability    Right-handed  Caffeine: 3 sodas per day   Occasional alcohol no drugs   Former smoker former snuff user   Social Determinants of Radio broadcast assistant Strain: Low Risk  (08/16/2022)   Overall Financial Resource Strain (CARDIA)    Difficulty of Paying Living Expenses: Not hard at all  Food Insecurity: No Food Insecurity (08/16/2022)   Hunger Vital Sign    Worried About Running Out of Food in the Last Year: Never true    [place] in the Last Year: Never true  Transportation Needs: No Transportation Needs (08/16/2022)   PRAPARE - Hydrologist (Medical): No    Lack of Transportation (Non-Medical): No  Physical Activity: Inactive (08/16/2022)   Exercise Vital Sign    Days of Exercise per Week: 0 days    Minutes of Exercise per Session: 0 min  Stress: No Stress Concern Present (08/16/2022)   [place]    Feeling of Stress : Not at all  Social Connections: Moderately Isolated (08/16/2022)   Social Connection and Isolation Panel [NHANES]    Frequency of Communication with Friends and Family: More than three times a week    Frequency of Social Gatherings with Friends and Family: More than three times a week    Attends Religious Services: Never    Building surveyor or Organizations: No    Attends Music therapist: Never    Marital Status: Married    Tobacco Counseling Counseling given: Not Answered Tobacco comments: Counseling sheet given in exam room    Clinical Intake:  Pre-visit preparation completed: Yes  Pain : No/denies pain     BMI - recorded: 48.69 Nutritional Status: BMI > 30  Obese Nutritional Risks: None Diabetes: No  How often do you need to have someone help you when you read instructions, pamphlets, or other written materials from your doctor or pharmacy?: 1 - Never  Diabetic?no  Interpreter Needed?: No  Information entered by :: Charlott Rakes, LPN   Activities of Daily Living    08/16/2022   10:38 AM  In your present state of health, do you have any difficulty performing the following activities:  Hearing? 0  Vision? 0  Difficulty concentrating or making decisions? 0  Walking or climbing stairs? 0  Dressing or bathing? 0  Doing errands, shopping? 0  Preparing Food and eating ? N  Using the Toilet? N  In the past six months, have you accidently leaked urine? N  Do you have problems with loss of bowel control? N  Managing your Medications? N  Managing your Finances? N  Housekeeping or managing your Housekeeping? N    Patient Care Team: [name], MD as PCP - General (Family Medicine) Juanita Craver, MD (Gastroenterology) Jackolyn Confer, MD (General Surgery) Rana Snare, MD (Inactive) (Urology) Kem Parkinson, MD as Referring Physician (Colon and Rectal Surgery) Gatha Mayer, MD as Consulting Physician (Gastroenterology)  Indicate any recent Medical Services you may have received from other than [facility] providers in the past year (date may be approximate).     Assessment:   This is a routine wellness examination for [facility].  Hearing/Vision screen Hearing Screening - Comments:: Pt denies any hearing issues  Vision Screening - Comments:: Pt follows up with my eye  Dr for annual eye exams   Dietary issues and exercise activities discussed: Current Exercise Habits: The patient does not participate in regular exercise at present  Goals Addressed             This Visit's Progress    Patient Stated       Lose some more weight        Depression Screen    08/16/2022   10:36 AM 03/31/2022   11:15 AM 09/28/2021    2:11 PM 03/30/2021   10:14 AM 09/08/2020    1:54 PM 03/10/2020    3:55 PM 09/10/2019    4:07 PM  PHQ 2/9 Scores  PHQ - 2 Score 0 0 0 0 0 0 0  PHQ- 9 Score  5 0        Fall Risk    08/16/2022   10:38 AM 03/31/2022   11:16 AM 09/28/2021    2:10 PM 03/30/2021   10:14 AM 09/08/2020    1:54 PM  [place] in the past year? 0 0 0 0 0  Number falls in past yr: 0 0 0 0   Injury with Fall? 0 0 0 0   Risk for fall due to : Impaired vision No Fall Risks No Fall Risks No Fall Risks   Follow up Falls prevention discussed  Falls evaluation completed Falls evaluation completed Falls evaluation completed    [place]:  Any stairs in or around the home? Yes  If so, are there any without handrails? No  Home free of loose throw rugs in walkways, pet beds, electrical cords, etc? Yes  Adequate lighting in your home to reduce risk of falls? Yes   ASSISTIVE DEVICES UTILIZED TO PREVENT FALLS:  Life alert? No  Use of a cane, walker or w/c? No  Grab bars in the bathroom? No  Shower chair or bench in shower? No  Elevated toilet seat or a handicapped toilet? No   TIMED UP AND GO:  Was the test performed? No .   Cognitive Function:        08/16/2022   10:39 AM  6CIT Screen  What Year? 0 points  What month? 0 points  What time? 3 points  Count back from 20 0 points  Months in reverse 0 points  Repeat phrase 0 points  Total Score 3 points    Immunizations Immunization History  Administered Date(s) Administered   Hepatitis A 09/01/2011, 03/22/2012   Hepatitis A, Adult 09/01/2011, 03/22/2012    Influenza,inj,Quad PF,6+ Mos 05/01/2013, 04/07/2015, 06/26/2016, 04/12/2018, 03/06/2019, 09/28/2021, 03/31/2022   Influenza-Unspecified 03/16/2020, 03/30/2021   Moderna Sars-Covid-2 Vaccination 10/30/2019, 11/27/2019, 07/17/2020   PPD Test 05/19/2011, 12/24/2012, 12/28/2014   Pneumococcal Polysaccharide-23 09/01/2011   Tdap 09/01/2011, 09/28/2021    TDAP status: Up to date  Flu Vaccine status: Up to date    Covid-19 vaccine status: Completed vaccines  Qualifies for Shingles Vaccine? Yes   Zostavax completed No   Shingrix Completed?: No.    Education has been provided regarding the importance of this vaccine. Patient has been advised to call insurance company to determine out of pocket expense if they have not yet received this vaccine. Advised may also receive vaccine at local pharmacy or Health Dept. Verbalized acceptance and understanding.  Screening Tests Health Maintenance  Topic Date Due   Zoster Vaccines- Shingrix (1 of 2) Never done   COVID-19 Vaccine (4 - 2023-24 season) 03/03/2022   Medicare Annual Wellness (AWV)  08/17/2023   COLONOSCOPY (Pts 45-10yr Insurance coverage will need to be confirmed)  01/25/2028   DTaP/Tdap/Td (3 - Td or Tdap) 09/29/2031  INFLUENZA VACCINE  Completed   Hepatitis C Screening  Completed   HIV Screening  Completed   HPV VACCINES  Aged Out    Health Maintenance  Health Maintenance Due  Topic Date Due   Zoster Vaccines- Shingrix (1 of 2) Never done   COVID-19 Vaccine (4 - 2023-24 season) 03/03/2022    Colorectal cancer screening: Type of screening: Colonoscopy. Completed 01/24/18. Repeat every 10 years  Additional Screening:  Hepatitis C Screening: Completed 10/01/19  Vision Screening: Recommended annual ophthalmology exams for early detection of glaucoma and other disorders of the eye. Is the patient up to date with their annual eye exam?  Yes  Who is the provider or what is the name of the office in which the patient attends annual  eye exams? [facility]  If pt is not established with a provider, would they like to be referred to a provider to establish care? No .   Dental Screening: Recommended annual dental exams for proper oral hygiene  Community Resource Referral / Chronic Care Management: CRR required this visit?  No   CCM required this visit?  No      Plan:     I have personally reviewed and noted the following in the patient's chart:   Medical and social history Use of alcohol, tobacco or illicit drugs  Current medications and supplements including opioid prescriptions. Patient is not currently taking opioid prescriptions. Functional ability and status Nutritional status Physical activity Advanced directives List of other physicians Hospitalizations, surgeries, and ER visits in previous 12 months Vitals Screenings to include cognitive, depression, and falls Referrals and appointments  In addition, I have reviewed and discussed with patient certain preventive protocols, quality metrics, and best practice recommendations. A written personalized care plan for preventive services as well as general preventive health recommendations were provided to patient.     Willette Brace, LPN   [phone]   Nurse Notes: none

## 2022-08-16 NOTE — Patient Instructions (Signed)
Craig Guzman , Thank you for taking time to come for your Medicare Wellness Visit. I appreciate your ongoing commitment to your health goals. Please review the following plan we discussed and let me know if I can assist you in the future.   These are the goals we discussed:  Goals      Patient Stated     Lose some more weight         This is a list of the screening recommended for you and due dates:  Health Maintenance  Topic Date Due   Zoster (Shingles) Vaccine (1 of 2) Never done   COVID-19 Vaccine (4 - 2023-24 season) 03/03/2022   Medicare Annual Wellness Visit  08/17/2023   Colon Cancer Screening  01/25/2028   DTaP/Tdap/Td vaccine (3 - Td or Tdap) 09/29/2031   Flu Shot  Completed   Hepatitis C Screening: USPSTF Recommendation to screen - Ages 18-79 yo.  Completed   HIV Screening  Completed   HPV Vaccine  Aged Out    Advanced directives: Advance directive discussed with you today. Even though you declined this today please call our office should you change your mind and we can give you the proper paperwork for you to fill out.  Conditions/risks identified: lose some more weight   Next appointment: Follow up in one year for your annual wellness visit   Preventive Care 40-64 Years, Male Preventive care refers to lifestyle choices and visits with your health care provider that can promote health and wellness. What does preventive care include? A yearly physical exam. This is also called an annual well check. Dental exams once or twice a year. Routine eye exams. Ask your health care provider how often you should have your eyes checked. Personal lifestyle choices, including: Daily care of your teeth and gums. Regular physical activity. Eating a healthy diet. Avoiding tobacco and drug use. Limiting alcohol use. Practicing safe sex. Taking low-dose aspirin every day starting at age 62. What happens during an annual well check? The services and screenings done by your health  care provider during your annual well check will depend on your age, overall health, lifestyle risk factors, and family history of disease. Counseling  Your health care provider may ask you questions about your: Alcohol use. Tobacco use. Drug use. Emotional well-being. Home and relationship well-being. Sexual activity. Eating habits. Work and work Statistician. Screening  You may have the following tests or measurements: Height, weight, and BMI. Blood pressure. Lipid and cholesterol levels. These may be checked every 5 years, or more frequently if you are over 51 years old. Skin check. Lung cancer screening. You may have this screening every year starting at age 45 if you have a 30-pack-year history of smoking and currently smoke or have quit within the past 15 years. Fecal occult blood test (FOBT) of the stool. You may have this test every year starting at age 85. Flexible sigmoidoscopy or colonoscopy. You may have a sigmoidoscopy every 5 years or a colonoscopy every 10 years starting at age 24. Prostate cancer screening. Recommendations will vary depending on your family history and other risks. Hepatitis C blood test. Hepatitis B blood test. Sexually transmitted disease (STD) testing. Diabetes screening. This is done by checking your blood sugar (glucose) after you have not eaten for a while (fasting). You may have this done every 1-3 years. Discuss your test results, treatment options, and if necessary, the need for more tests with your health care provider. Vaccines  Your health care  provider may recommend certain vaccines, such as: Influenza vaccine. This is recommended every year. Tetanus, diphtheria, and acellular pertussis (Tdap, Td) vaccine. You may need a Td booster every 10 years. Zoster vaccine. You may need this after age 24. Pneumococcal 13-valent conjugate (PCV13) vaccine. You may need this if you have certain conditions and have not been vaccinated. Pneumococcal  polysaccharide (PPSV23) vaccine. You may need one or two doses if you smoke cigarettes or if you have certain conditions. Talk to your health care provider about which screenings and vaccines you need and how often you need them. This information is not intended to replace advice given to you by your health care provider. Make sure you discuss any questions you have with your health care provider. Document Released: 07/16/2015 Document Revised: 03/08/2016 Document Reviewed: 04/20/2015 Elsevier Interactive Patient Education  2017 [employer] Prevention in the Home Falls can cause injuries. They can happen to people of all ages. There are many things you can do to make your home safe and to help prevent falls. What can I do on the outside of my home? Regularly fix the edges of walkways and driveways and fix any cracks. Remove anything that might make you trip as you walk through a door, such as a raised step or threshold. Trim any bushes or trees on the path to your home. Use bright outdoor lighting. Clear any walking paths of anything that might make someone trip, such as rocks or tools. Regularly check to see if handrails are loose or broken. Make sure that both sides of any steps have handrails. Any raised decks and porches should have guardrails on the edges. Have any leaves, snow, or ice cleared regularly. Use sand or salt on walking paths during winter. Clean up any spills in your garage right away. This includes oil or grease spills. What can I do in the bathroom? Use night lights. Install grab bars by the toilet and in the tub and shower. Do not use towel bars as grab bars. Use non-skid mats or decals in the tub or shower. If you need to sit down in the shower, use a plastic, non-slip stool. Keep the floor dry. Clean up any water that spills on the floor as soon as it happens. Remove soap buildup in the tub or shower regularly. Attach bath mats securely with double-sided  non-slip rug tape. Do not have throw rugs and other things on the floor that can make you trip. What can I do in the bedroom? Use night lights. Make sure that you have a light by your bed that is easy to reach. Do not use any sheets or blankets that are too big for your bed. They should not hang down onto the floor. Have a firm chair that has side arms. You can use this for support while you get dressed. Do not have throw rugs and other things on the floor that can make you trip. What can I do in the kitchen? Clean up any spills right away. Avoid walking on wet floors. Keep items that you use a lot in easy-to-reach places. If you need to reach something above you, use a strong step stool that has a grab bar. Keep electrical cords out of the way. Do not use floor polish or wax that makes floors slippery. If you must use wax, use non-skid floor wax. Do not have throw rugs and other things on the floor that can make you trip. What can I do with my  stairs? Do not leave any items on the stairs. Make sure that there are handrails on both sides of the stairs and use them. Fix handrails that are broken or loose. Make sure that handrails are as long as the stairways. Check any carpeting to make sure that it is firmly attached to the stairs. Fix any carpet that is loose or worn. Avoid having throw rugs at the top or bottom of the stairs. If you do have throw rugs, attach them to the floor with carpet tape. Make sure that you have a light switch at the top of the stairs and the bottom of the stairs. If you do not have them, ask someone to add them for you. What else can I do to help prevent falls? Wear shoes that: Do not have high heels. Have rubber bottoms. Are comfortable and fit you well. Are closed at the toe. Do not wear sandals. If you use a stepladder: Make sure that it is fully opened. Do not climb a closed stepladder. Make sure that both sides of the stepladder are locked into place. Ask  someone to hold it for you, if possible. Clearly mark and make sure that you can see: Any grab bars or handrails. First and last steps. Where the edge of each step is. Use tools that help you move around (mobility aids) if they are needed. These include: Canes. Walkers. Scooters. Crutches. Turn on the lights when you go into a dark area. Replace any light bulbs as soon as they burn out. Set up your furniture so you have a clear path. Avoid moving your furniture around. If any of your floors are uneven, fix them. If there are any pets around you, be aware of where they are. Review your medicines with your doctor. Some medicines can make you feel dizzy. This can increase your chance of falling. Ask your doctor what other things that you can do to help prevent falls. This information is not intended to replace advice given to you by your health care provider. Make sure you discuss any questions you have with your health care provider. Document Released: 04/15/2009 Document Revised: 11/25/2015 Document Reviewed: 07/24/2014 Elsevier Interactive Patient Education  2017 [employer].

## 2022-08-22 ENCOUNTER — Encounter: Payer: Self-pay | Admitting: Internal Medicine

## 2022-08-22 ENCOUNTER — Ambulatory Visit: Payer: Medicare Other | Admitting: Internal Medicine

## 2022-08-22 ENCOUNTER — Other Ambulatory Visit (INDEPENDENT_AMBULATORY_CARE_PROVIDER_SITE_OTHER): Payer: Medicare Other

## 2022-08-22 VITALS — BP 140/82 | HR 86 | Ht 72.0 in | Wt 358.0 lb

## 2022-08-22 DIAGNOSIS — K50019 Crohn's disease of small intestine with unspecified complications: Secondary | ICD-10-CM | POA: Diagnosis not present

## 2022-08-22 DIAGNOSIS — K911 Postgastric surgery syndromes: Secondary | ICD-10-CM

## 2022-08-22 DIAGNOSIS — K912 Postsurgical malabsorption, not elsewhere classified: Secondary | ICD-10-CM | POA: Diagnosis not present

## 2022-08-22 DIAGNOSIS — E559 Vitamin D deficiency, unspecified: Secondary | ICD-10-CM

## 2022-08-22 DIAGNOSIS — Z903 Acquired absence of stomach [part of]: Secondary | ICD-10-CM

## 2022-08-22 DIAGNOSIS — R0982 Postnasal drip: Secondary | ICD-10-CM | POA: Diagnosis not present

## 2022-08-22 LAB — VITAMIN D 25 HYDROXY (VIT D DEFICIENCY, FRACTURES): VITD: 11.94 ng/mL — ABNORMAL LOW (ref 30.00–100.00)

## 2022-08-22 LAB — CBC WITH DIFFERENTIAL/PLATELET
Basophils Absolute: 0 10*3/uL (ref 0.0–0.1)
Basophils Relative: 0.7 % (ref 0.0–3.0)
Eosinophils Absolute: 0.2 10*3/uL (ref 0.0–0.7)
Eosinophils Relative: 4 % (ref 0.0–5.0)
HCT: 40.9 % (ref 39.0–52.0)
Hemoglobin: 13.5 g/dL (ref 13.0–17.0)
Lymphocytes Relative: 31.2 % (ref 12.0–46.0)
Lymphs Abs: 1.6 10*3/uL (ref 0.7–4.0)
MCHC: 33 g/dL (ref 30.0–36.0)
MCV: 78.8 fl (ref 78.0–100.0)
Monocytes Absolute: 0.8 10*3/uL (ref 0.1–1.0)
Monocytes Relative: 14.4 % — ABNORMAL HIGH (ref 3.0–12.0)
Neutro Abs: 2.6 10*3/uL (ref 1.4–7.7)
Neutrophils Relative %: 49.7 % (ref 43.0–77.0)
Platelets: 199 10*3/uL (ref 150.0–400.0)
RBC: 5.19 Mil/uL (ref 4.22–5.81)
RDW: 15.9 % — ABNORMAL HIGH (ref 11.5–15.5)
WBC: 5.3 10*3/uL (ref 4.0–10.5)

## 2022-08-22 LAB — FERRITIN: Ferritin: 65.2 ng/mL (ref 22.0–322.0)

## 2022-08-22 NOTE — Patient Instructions (Addendum)
Your provider has requested that you go to the basement level for lab work before leaving today. Press "B" on the elevator. The lab is located at the first door on the left as you exit the elevator.  We have referred you to allergy and asthma they will contact you to schedule a appointment.    You can retry alkaline water and can try carbonated flavored water drinks - get off sugar!  Remember you have to eat several small meals a day.  Ask Dr. Benjamine Mola if you have inner ear disturbances that could make you vomit.   Box breathing to relax and reduce anxiety and induce sleep  Inhale to a count of 4; hold that 4 count; exhale over 4 count; hold exhale x 4  Then repeat this - a few times  _______________________________________________________  If your blood pressure at your visit was 140/90 or greater, please contact your primary care physician to follow up on this.  _______________________________________________________  If you are age 102 or older, your body mass index should be between 23-30. Your Body mass index is 48.55 kg/m. If this is out of the aforementioned range listed, please consider follow up with your Primary Care Provider.  If you are age 64 or younger, your body mass index should be between 19-25. Your Body mass index is 48.55 kg/m. If this is out of the aformentioned range listed, please consider follow up with your Primary Care Provider.   ________________________________________________________  The [facility] providers would like to encourage you to use [facility] to communicate with providers for non-urgent requests or questions.  Due to long hold times on the telephone, sending your provider a message by [facility] may be a faster and more efficient way to get a response.  Please allow 48 business hours for a response.  Please remember that this is for non-urgent requests.  _______________________________________________________ Thank you for trusting me with your  gastrointestinal care!   Silvano Rusk MD

## 2022-08-22 NOTE — Progress Notes (Signed)
Craig Guzman. 52 y.o. May 03, 1971 [id number]  Assessment & Plan:   Encounter Diagnoses  Name Primary?   Postgastrectomy malabsorption Yes   Postsurgical dumping syndrome    Gastroduodenal Crohn's disease with complication (HCC)    Vitamin D deficiency    Postnasal drip      Many of his symptoms are similar.  I wonder if he might not have some sort of inner ear disturbance since he tends to vomit while upright but not supine though he has this postnasal drip problem that may be making him gag.  It is a little confusing.   I reminded him that he really needs to be on a dumping diet and eat small frequent meals and to avoid high sugar beverages and foods.  I do not think his Crohn's disease is necessarily active and would not pursue any cross-sectional imaging or endoscopy at this time.  I am going to evaluate his situation with the testing below, we will refer him to allergy and he should follow-up with Dr. Lorelee Cover of ENT also.  I have asked him to get Dr. Deeann Saint opinion about any type of inner ear disturbance that could be causing him to vomit, i.e. is there a potential vestibular disturbance.  Probably not but I think it makes sense to evaluate that further at his ENT visit.  I will see him back in 2 months.  Orders Placed This Encounter  Procedures   CBC w/Diff   Vitamin D (25 hydroxy)   Ferritin   Ambulatory referral to Allergy   CC: [name], MD Dr. Charyl Bigger  Subjective:   Gastroenterology summary  Gastroduodenal Crohn's disease: Granulomatous gastritis on EGD in late 2012. Started on steroids after workup for other causes negative. Subsequently had EGD and colonoscopy which showed granulomatous gastritis, and inflammatory changes consistent with Crohn's disease in the terminal ileum and cecal area. Has had clinical response to prednisone. TPMT Phenotype is normal - 08/08/2011 - 6 MP begun 6TG level below therapeutic at 153 and 6MMPN level below toxic at 1189 -  increase 6MP to 150/day 12/20/2011 6 TG therapeutic  2016 - CimziaTx 11/04/2012 - IBD panel not consistent with Crohn's disease July 2014 Humira started - OFF early 2016 ($$) 2015 4 hr GES NL - improved (after Tx of inflammation) 2019 surgical resection of gastroduodenal stricture 08/2018 stopped Stelara and 6 MP ? Causing sxs CT scan 08/08/2018 abdomen and pelvis with contrast postoperative changes nothing else Domperidone (not helpful) and Reglan (intolerant) 2023 saw dietitian regarding dumping syndrome did not keep follow-up  Crohn's ileocolitis? Only seen at colonoscopy in  2012.  Never again.  Not on cross-sectional imaging either   Postsurgical dumping syndrome Postgastrectomy malabsorption B12 deficiency    Chief Complaint: Abdominal pain and vomiting  HPI 52 year old African-American man with gastroduodenal and prior colonic Crohn's disease status post gastroduodenal resection for a stricture who presents with persistent chronic vomiting issues and epigastric pain. He has been on metoclopramide which he is intolerant of and domperidone with limited help in the past prior to his surgery.     Name Primary?   Postgastrectomy malabsorption Yes   Postsurgical dumping syndrome    Gastroduodenal Crohn's disease with complication (HCC)    Vitamin D deficiency    Postnasal drip      Many of his symptoms are similar.  I wonder if he might not have some sort of inner ear disturbance since he tends to vomit while upright but not supine though he has this postnasal drip problem that may be making him gag.  It is a little confusing.   I reminded him that he really needs to be on a dumping diet and eat small frequent meals and to avoid high sugar beverages and foods.  I do not think his Crohn's disease is necessarily active and would not pursue any cross-sectional imaging or endoscopy at this time.  I am going to evaluate his situation with the testing below, we will refer him to allergy and he should follow-up with Dr. Lorelee Cover of ENT also.  I have asked him to get Dr. Deeann Saint opinion about any type of inner ear disturbance that could be causing him to vomit, i.e. is there a potential vestibular disturbance.  Probably not but I think it makes sense to evaluate that further at his ENT visit.  I will see him back in 2 months.  Orders Placed This Encounter  Procedures   CBC w/Diff   Vitamin D (25 hydroxy)   Ferritin   Ambulatory referral to Allergy   CC: [name], MD Dr. Charyl Bigger  Subjective:   Gastroenterology summary  Gastroduodenal Crohn's disease: Granulomatous gastritis on EGD in late 2012. Started on steroids after workup for other causes negative. Subsequently had EGD and colonoscopy which showed granulomatous gastritis, and inflammatory changes consistent with Crohn's disease in the terminal ileum and cecal area. Has had clinical response to prednisone. TPMT Phenotype is normal - 08/08/2011 - 6 MP begun 6TG level below therapeutic at 153 and 6MMPN level below toxic at 1189 -  increase 6MP to 150/day 12/20/2011 6 TG therapeutic  2016 - CimziaTx 11/04/2012 - IBD panel not consistent with Crohn's disease July 2014 Humira started - OFF early 2016 ($$) 2015 4 hr GES NL - improved (after Tx of inflammation) 2019 surgical resection of gastroduodenal stricture 08/2018 stopped Stelara and 6 MP ? Causing sxs CT scan 08/08/2018 abdomen and pelvis with contrast postoperative changes nothing else Domperidone (not helpful) and Reglan (intolerant) 2023 saw dietitian regarding dumping syndrome did not keep follow-up  Crohn's ileocolitis? Only seen at colonoscopy in  2012.  Never again.  Not on cross-sectional imaging either   Postsurgical dumping syndrome Postgastrectomy malabsorption B12 deficiency    Chief Complaint: Abdominal pain and vomiting  HPI 52 year old African-American man with gastroduodenal and prior colonic Crohn's disease status post gastroduodenal resection for a stricture who presents with persistent chronic vomiting issues and epigastric pain. He has been on metoclopramide which he is intolerant of and domperidone with limited help in the past prior to his surgery.    He is falling asleep during the day and if he is upright he will choke and gag and vomit.  This is worrying him he is concerned about potential aspiration.  Has sleep apnea has not been able to wear CPAP because it "dried everything up".  Has chronic postnasal drip and lots of allergies.  Many years ago he went to an allergist and he says "I am allergic  to [facility]".  Multiple tree and pollen allergies etc.  He is not really on any medication for that.  He does plan to go back to Dr. Benjamine Mola regarding ENT issues as well.  He has had sinus procedures before.  He has not been has not been in some time due to insurance change but now his insurance will cover Dr. Lorelee Cover.  Still has dumping issues but he drinks sugary beverages (e.g. Icee's) and will get his right lower quadrant cramps and have a urgent  diarrhea.  This is not new.  He is aware and has been to the dietitian but is not adhering to the post dumping diet.  Willing to go back to the dietitian but was not keeping follow-up due to "scheduling issues".  He was pretty sick with upper respiratory problems for several weeks and went to urgent care on 2 2 and ended up with prednisone and an albuterol inhaler.  He reports 4 episodes of vomiting in December and 6 in January.  When he lies down he does not regurgitate or vomit.  Didn't take bp meds yet today   Allergies  Allergen Reactions   Zithromax [Azithromycin] Hypertension   Lisinopril Swelling    SWELLING REACTION UNSPECIFIED    Tessalon Perles [Benzonatate]     hypertension   Current Meds  Medication Sig   albuterol (VENTOLIN HFA) 108 (90 Base) MCG/ACT inhaler Inhale 1-2 puffs into the lungs every 6 (six) hours as needed for wheezing or shortness of breath.   Calcium Carb-Cholecalciferol (CALCIUM 1000 + D PO) Take by mouth.   cyanocobalamin (,VITAMIN B-12,) 1000 MCG/ML injection INJECT 1 ML (1,000 MCG TOTAL) INTO THE MUSCLE ONCE FOR 1 DOSE. PLEASE PROVIDE 3 CC SYRINGE #12, 1 1/2 INCH 23 GAUGE NEEDLE #12   doxazosin (CARDURA) 8 MG tablet TAKE ONE TABLET BY MOUTH AT BEDTIME   Multiple Vitamins-Calcium (MULTI-DAY/CALCIUM/EXTRA IRON PO) Take by mouth.   olmesartan (BENICAR) 20 MG tablet TAKE ONE TABLET BY MOUTH EVERY DAY   rizatriptan (MAXALT-MLT) 10 MG disintegrating tablet TAKE 1 TABLET 3 TIME A DA Y AS NEEDED FOR MIGRAINE   verapamil (CALAN-SR) 180 MG CR tablet TAKE TWO TABLETS BY MOUTH EVERY DAY   Vitamin D, Ergocalciferol, 50000 units CAPS Take 50,000 Units by mouth every 7 (seven) days.   Past Medical History:  Diagnosis Date   Allergy    trees/ grasses/ animals/dust/mold   Biliary dyskinesia    Colloid thyroid nodule    COVID-19 01/2021   Crohn's disease ([place])    Stomach, terminal ileum, cecum   Dyspnea    due to nasal /sinus congestion   Gastric ulcer    antral    GERD (gastroesophageal reflux disease)    History of kidney stones    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   History of migraine headaches    HTN (hypertension)    Kidney stone 09/20/2012   Migraine    Obesity    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    Pneumonia 02/27/2014   ED   Postsurgical dumping syndrome 02/14/2019   Sleep apnea    has cpap- does not wear    Past Surgical History:  Procedure Laterality Date   BALLOON DILATION N/A 12/06/2017   Procedure: BALLOON DILATION;  Surgeon: Gatha Mayer, MD;  Location: [facility];  Service: Endoscopy;  Laterality: N/A;   BIOPSY  06/21/2018   Procedure: BIOPSY;  Surgeon: Gatha Mayer, MD;  Location: [facility];  Service:  Endoscopy;;   CHOLECYSTECTOMY  2011   [place]   CIRCUMCISION N/A 12/09/2018   Procedure: CIRCUMCISION ADULT;  Surgeon: Lucas Mallow, MD;  Location: [facility];  Service: Urology;  Laterality: N/A;   COLONOSCOPY  07/26/11   Crohn's colitis, ileitis   ESOPHAGOGASTRODUODENOSCOPY  05/04/11, 07/26/11   granulomatous gastritis - Crohn's   ESOPHAGOGASTRODUODENOSCOPY (EGD) WITH PROPOFOL N/A 03/28/2017   Procedure: ESOPHAGOGASTRODUODENOSCOPY (EGD) WITH PROPOFOL  with balloon dilation of duodenum;  Surgeon: [name], MD;  Location: [facility];  Service: Gastroenterology;  Laterality: N/A;   ESOPHAGOGASTRODUODENOSCOPY (EGD) WITH PROPOFOL N/A 12/06/2017   Procedure: ESOPHAGOGASTRODUODENOSCOPY (EGD) WITH PROPOFOL;  Surgeon: Gatha Mayer, MD;  Location: [facility];  Service: Endoscopy;  Laterality: N/A;   ESOPHAGOGASTRODUODENOSCOPY (EGD) WITH PROPOFOL N/A 06/21/2018   Procedure: ESOPHAGOGASTRODUODENOSCOPY (EGD) WITH PROPOFOL;  Surgeon: Gatha Mayer, MD;  Location: [facility];  Service: Endoscopy;  Laterality: N/A;   ETHMOIDECTOMY N/A 11/27/2018   Procedure: ENDOSCOPIC TOTAL ETHMOIDECTOMY;  Surgeon: Leta Baptist, MD;  Location: [facility];  Service: ENT;  Laterality: N/A;   FLEXIBLE  SIGMOIDOSCOPY     GASTROJEJUNOSTOMY  01/2018   MULTIPLE TOOTH EXTRACTIONS  2005   NASAL SEPTOPLASTY W/ TURBINOPLASTY Bilateral 11/27/2018   Procedure: NASAL SEPTOPLASTY WITH BILATERAL TURBINATE REDUCTION;  Surgeon: Leta Baptist, MD;  Location: [facility];  Service: ENT;  Laterality: Bilateral;   SHOULDER SURGERY     right   SINUS ENDO WITH FUSION N/A 11/27/2018   Procedure: SINUS ENDO WITH FUSION;  Surgeon: Leta Baptist, MD;  Location: [facility];  Service: ENT;  Laterality: N/A;   UPPER GASTROINTESTINAL ENDOSCOPY     VASECTOMY     bilateral w/lysis of penile adhesions   Social History   Social History Narrative   Married. Education: [employer].    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   Review of Systems As per HPI  Objective:   Physical Exam BP (!) 140/82   Pulse 86   Ht 6' (1.829 m)   Wt (!) 358 lb (162.4 kg)   SpO2 98%   BMI 48.55 kg/m  Very obese black man in no acute distress Eyes anicteric Lungs are clear Normal heart sounds Abdomen is very obese soft and tender in the right upper quadrant epigastrium and left upper quadrants.  Mild.  Similar to previous exams.  No masses no obvious organomegaly.    43 minutes total time on this visit today before during and after patient contact time

## 2022-08-25 ENCOUNTER — Other Ambulatory Visit: Payer: Self-pay | Admitting: Internal Medicine

## 2022-08-25 ENCOUNTER — Encounter: Payer: Self-pay | Admitting: Internal Medicine

## 2022-08-25 DIAGNOSIS — E559 Vitamin D deficiency, unspecified: Secondary | ICD-10-CM

## 2022-08-25 MED ORDER — VITAMIN D (ERGOCALCIFEROL) 1.25 MG (50000 UNIT) PO CAPS: 50000.0000 [IU] | ORAL_CAPSULE | ORAL | 0 refills | Status: AC

## 2022-09-01 ENCOUNTER — Other Ambulatory Visit: Payer: Self-pay

## 2022-09-01 ENCOUNTER — Encounter: Payer: Self-pay | Admitting: Allergy

## 2022-09-01 ENCOUNTER — Ambulatory Visit (INDEPENDENT_AMBULATORY_CARE_PROVIDER_SITE_OTHER): Payer: Medicare Other | Admitting: Allergy

## 2022-09-01 VITALS — BP 180/108 | HR 100 | Temp 97.6°F | Resp 20 | Ht 72.0 in | Wt 358.8 lb

## 2022-09-01 DIAGNOSIS — I10 Essential (primary) hypertension: Secondary | ICD-10-CM | POA: Diagnosis not present

## 2022-09-01 DIAGNOSIS — H1013 Acute atopic conjunctivitis, bilateral: Secondary | ICD-10-CM

## 2022-09-01 DIAGNOSIS — J3089 Other allergic rhinitis: Secondary | ICD-10-CM | POA: Diagnosis not present

## 2022-09-01 MED ORDER — RYALTRIS 665-25 MCG/ACT NA SUSP: 2.0000 | Freq: Two times a day (BID) | NASAL | 5 refills | Status: AC | PRN

## 2022-09-01 NOTE — Patient Instructions (Addendum)
-   Testing today showed: dust mites. - Copy of test results provided.  - Avoidance measures provided. - Continue with: Nasal saline rinse daily - Start taking: Zyrtec (cetirizine) '10mg'$  tablet once daily.  This is a long-acting antihistamine.  Ryaltris (olopatadine/mometasone) two sprays per nostril 1-2 times daily as needed for runny or stuffy nose.  Pataday (olopatadine) one drop per eye daily as needed for itchy/watery eyes.   None of these medications should have any impact on your blood pressure control.  - You can use an extra dose of the antihistamine, if needed, for breakthrough symptoms.  - Continue nasal saline rinses 1-2 times daily to remove allergens from the nasal cavities as well as help with mucous clearance (this is especially helpful to do before the nasal sprays are given).   Do this prior to nasal spray use.    - Blood pressure reading in office is elevated.  Recommend you talk with your PCP, Dr Carlota Raspberry, about your BP management - Recommend you perform your own BP readings at home if you have a monitor at home and keep a log for your PCP to review   Recommend at next visit you have intradermal skin testing done to see if you have any other allergens to add to dust mites.  You would need to hold antihistamines (zyrtec) for 3 days prior to the visit  Follow-up in 4-6 months or sooner if needed

## 2022-09-01 NOTE — Progress Notes (Signed)
New Patient Note  RE: Craig Guzman. MRN: [id number] DOB: 09-04-1970 Date of Office Visit: 09/01/2022   Primary care provider: [name], MD  Chief Complaint: allergies  History of present illness: Craig Guzman. is a 52 y.o. male presenting today for evaluation of allergic rhinitis.   It has been about 20+ years since he last had allergy testing (recalls being positive to trees, grasses, dog, cat, rabbit, mold). He would like to see what he is still allergic to at this time.   He reports symptoms of constant nasal drainage, occasional HA, always has pressure in forehead and across nose.  He states due to the drainage he swallows it and he then can throw it back up due to his GI anatomy at this time.  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  He also reports itchy eyes, throat clearing, occasional nasal congestion.  He states he hasn't had any sinus infections lately.  He does nasal saline rinse daily.  He does not take anything for his allergies at this time as he states was worried it would interfere with his BP control.   No history of eczema, food allergy or asthma. He has had an albuterol inhaler for bronchitis in the past.  He states eating egg alone makes him having to vomit but he can eat egg if it is mixed with other stuff like on a sandwich or in fried rice.  He also states he doesn't tolerate spaghetti sauce but he likes and eats tomatoes without issue.   He states he has taken his BP medications today and feels fine.   Review of systems: Review of Systems  Constitutional: Negative.   HENT:  Positive for congestion and postnasal drip.   Eyes: Negative.   Respiratory: Negative.    Cardiovascular: Negative.   Musculoskeletal: Negative.   Skin: Negative.   Allergic/Immunologic: Negative.   Neurological: Negative.     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   History of migraine headaches    HTN (hypertension)    Kidney stone 09/20/2012   Migraine    Obesity    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    Pneumonia 02/27/2014   ED   Postsurgical dumping syndrome 02/14/2019   Sleep apnea    has cpap- does not wear     Past surgical history: Past Surgical History:  Procedure Laterality Date   BALLOON DILATION N/A 12/06/2017   Procedure: BALLOON DILATION;  Surgeon: Gatha Mayer, MD;  Location: [facility];  Service: Endoscopy;  Laterality: N/A;   BIOPSY  06/21/2018   Procedure: BIOPSY;  Surgeon: Gatha Mayer, MD;  Location: [facility];  Service: Endoscopy;;   CHOLECYSTECTOMY  2011   [place]   CIRCUMCISION N/A 12/09/2018   Procedure: CIRCUMCISION ADULT;  Surgeon: Lucas Mallow, MD;  Location: [facility];  Service: Urology;  Laterality: N/A;   COLONOSCOPY  07/26/11   Crohn's colitis,  ileitis   ESOPHAGOGASTRODUODENOSCOPY  05/04/11, 07/26/11   granulomatous gastritis - Crohn's   ESOPHAGOGASTRODUODENOSCOPY (EGD) WITH PROPOFOL N/A 03/28/2017   Procedure: ESOPHAGOGASTRODUODENOSCOPY (EGD) WITH PROPOFOL  with balloon dilation of duodenum;  Surgeon: [name], MD;  Location: [facility];  Service: Gastroenterology;  Laterality: N/A;   ESOPHAGOGASTRODUODENOSCOPY (EGD) WITH PROPOFOL N/A 12/06/2017   Procedure: ESOPHAGOGASTRODUODENOSCOPY (EGD) WITH PROPOFOL;  Surgeon: Gatha Mayer, MD;  Location: [facility];  Service: Endoscopy;  Laterality: N/A;   ESOPHAGOGASTRODUODENOSCOPY (EGD) WITH PROPOFOL N/A 06/21/2018   Procedure: ESOPHAGOGASTRODUODENOSCOPY (EGD) WITH PROPOFOL;   Surgeon: Gatha Mayer, MD;  Location: [facility];  Service: Endoscopy;  Laterality: N/A;   ETHMOIDECTOMY N/A 11/27/2018   Procedure: ENDOSCOPIC TOTAL ETHMOIDECTOMY;  Surgeon: Leta Baptist, MD;  Location: [facility];  Service: ENT;  Laterality: N/A;   FLEXIBLE SIGMOIDOSCOPY     GASTROJEJUNOSTOMY  01/2018   MULTIPLE TOOTH EXTRACTIONS  2005   NASAL SEPTOPLASTY W/ TURBINOPLASTY Bilateral 11/27/2018   Procedure: NASAL SEPTOPLASTY WITH BILATERAL TURBINATE REDUCTION;  Surgeon: Leta Baptist, MD;  Location: [facility];  Service: ENT;  Laterality: Bilateral;   SHOULDER SURGERY     right   SINUS ENDO WITH FUSION N/A 11/27/2018   Procedure: SINUS ENDO WITH FUSION;  Surgeon: Leta Baptist, MD;  Location: [facility];  Service: ENT;  Laterality: N/A;   UPPER GASTROINTESTINAL ENDOSCOPY     VASECTOMY     bilateral w/lysis of penile adhesions    Family history:  Family History  Problem Relation Age of Onset   Colon polyps Father    Diabetes Father    Hypertension Father    Hypertension Mother    Asthma Mother    Heart disease Maternal Grandmother    Colon cancer Neg Hx    Rectal cancer Neg Hx    Stomach cancer Neg Hx    Esophageal cancer Neg Hx     Social history: Lives in a home with carpeting in the bedroom with heat pump heating and central cooling.  [place] pig in the home.  No concern for water damage, mildew or roaches in the home.  Not currently working.  Denies current smoking history.    Occupational History   Occupation: Environmental health practitioner: [employer]  Tobacco Use   Smoking status: Former    Packs/day: 0.75    Years: 21.00    Total pack years: 15.75    Types: Cigarettes    Quit date: 12/01/2017    Years since quitting: 4.7   Smokeless tobacco: Former    Types: Snuff   Tobacco comments:    Counseling sheet given in exam room   Vaping Use   Vaping Use: Never used    Medication List: Current Outpatient Medications  Medication Sig Dispense Refill   albuterol (VENTOLIN HFA) 108 (90 Base) MCG/ACT  inhaler Inhale 1-2 puffs into the lungs every 6 (six) hours as needed for wheezing or shortness of breath. 18 g 0   Calcium Carb-Cholecalciferol (CALCIUM 1000 + D PO) Take by mouth.     cyanocobalamin (,VITAMIN B-12,) 1000 MCG/ML injection INJECT 1 ML (1,000 MCG TOTAL) INTO THE MUSCLE ONCE FOR 1 DOSE. PLEASE PROVIDE 3 CC SYRINGE #12, 1 1/2 INCH 23 GAUGE NEEDLE #12 3 mL 4   cyclobenzaprine (FLEXERIL) 10 MG tablet Take 10 mg by mouth as needed.     doxazosin (CARDURA) 8 MG tablet TAKE ONE TABLET BY MOUTH AT BEDTIME 90 tablet 0   Multiple Vitamins-Calcium (MULTI-DAY/CALCIUM/EXTRA IRON PO) Take by mouth.  olmesartan (BENICAR) 20 MG tablet TAKE ONE TABLET BY MOUTH EVERY DAY 90 tablet 0   rizatriptan (MAXALT-MLT) 10 MG disintegrating tablet TAKE 1 TABLET 3 TIME A DA Y AS NEEDED FOR MIGRAINE 12 tablet 1   verapamil (CALAN-SR) 180 MG CR tablet TAKE TWO TABLETS BY MOUTH EVERY DAY 180 tablet 0   Vitamin D, Ergocalciferol, (DRISDOL) 1.25 MG (50000 UNIT) CAPS capsule Take 1 capsule (50,000 Units total) by mouth every 7 (seven) days for 12 doses. 12 capsule 0   No current facility-administered medications for this visit.    Known medication allergies: Allergies  Allergen Reactions   Zithromax [Azithromycin] Hypertension   Lisinopril Swelling    SWELLING REACTION UNSPECIFIED    Tessalon Perles [Benzonatate]     hypertension     Physical examination: Blood pressure (!) 180/108, pulse 100, temperature 97.6 F (36.4 C), temperature source Temporal, resp. rate 20, height 6' (1.829 m), weight (!) 358 lb 12.8 oz (162.8 kg), SpO2 99 %.  General: Alert, interactive, in no acute distress. HEENT: PERRLA, TMs pearly gray, turbinates moderately edematous with crusty discharge, post-pharynx non erythematous. Neck: Supple without lymphadenopathy. Lungs: Clear to auscultation without wheezing, rhonchi or rales. {no increased work of breathing. CV: Normal S1, S2 without murmurs. Abdomen: Nondistended,  nontender. Skin: Warm and dry, without lesions or rashes. Extremities:  No clubbing, cyanosis or edema. Neuro:   Grossly intact.  Diagnositics/Labs:  Allergy testing:   Airborne Adult Perc - 09/01/22 1413     Allergen Manufacturer Lavella Hammock    Location Back    Number of Test 59    1. Control-Buffer 50% Glycerol Negative    2. Control-Histamine 1 mg/ml 2+    3. Albumin saline Negative    4. [place] Negative    5. [place] Negative    6. Johnson Negative    7. [place] Blue Negative    8. Meadow Fescue Negative    9. Perennial Rye Negative    10. Sweet Vernal Negative    11. Timothy Negative    12. Cocklebur Negative    13. Burweed Marshelder Negative    14. Ragweed, short Negative    15. Ragweed, Giant Negative    16. Plantain,  English Negative    17. Lamb's Quarters Negative    18. Sheep Sorrell Negative    19. Rough Pigweed Negative    20. Marsh Elder, Rough Negative    21. Mugwort, Common Negative    22. Ash mix Negative    23. Birch mix Negative    24. Beech American Negative    25. Box, Elder Negative    26. Cedar, red Negative    27. Cottonwood, [place] Negative    28. Elm mix Negative    29. Hickory Negative    30. Maple mix Negative    31. Oak, [place] mix Negative    32. Pecan Pollen Negative    33. Pine mix Negative    34. Sycamore Eastern Negative    35. Conner, Black Pollen Negative    36. Alternaria alternata Negative    37. Cladosporium Herbarum Negative    38. Aspergillus mix Negative    39. Penicillium mix Negative    40. Bipolaris sorokiniana (Helminthosporium) Negative    41. Drechslera spicifera (Curvularia) Negative    42. Mucor plumbeus Negative    43. Fusarium moniliforme Negative    44. Aureobasidium pullulans (pullulara) Negative    45. Rhizopus oryzae Negative    46. Botrytis cinera Negative    47.  Epicoccum nigrum Negative    48. Phoma betae Negative    49. Candida Albicans Negative    50. Trichophyton mentagrophytes Negative    51.  Mite, D Farinae  5,000 AU/ml Negative    52. Mite, D Pteronyssinus  5,000 AU/ml 2+    53. Cat Hair 10,000 BAU/ml Negative    54.  Dog Epithelia Negative    55. Mixed Feathers Negative    56. Horse Epithelia Negative    57. Cockroach, German Negative    58. Mouse Negative    59. Tobacco Leaf Negative             Allergy testing results were read and interpreted by provider, documented by clinical staff.   Assessment and plan: Allergic rhinitis  - Testing today showed: dust mites. - Copy of test results provided.  - Avoidance measures provided. - Continue with: Nasal saline rinse daily - Start taking: Zyrtec (cetirizine) '10mg'$  tablet once daily.  This is a long-acting antihistamine.  Ryaltris (olopatadine/mometasone) two sprays per nostril 1-2 times daily as needed for runny or stuffy nose.  Pataday (olopatadine) one drop per eye daily as needed for itchy/watery eyes.   None of these medications should have any impact on your blood pressure control.  - You can use an extra dose of the antihistamine, if needed, for breakthrough symptoms.  - Continue nasal saline rinses 1-2 times daily to remove allergens from the nasal cavities as well as help with mucous clearance (this is especially helpful to do before the nasal sprays are given).   Do this prior to nasal spray use.    Elevated BP reading in setting of HTN - Blood pressure reading in office is elevated.  Recommend you talk with your PCP, Dr Carlota Raspberry, about your BP management  - Recommend you perform your own BP readings at home if you have a monitor at home and keep a log for your PCP to review  Recommend at next visit you have intradermal skin testing done to see if you have any other allergens to add to dust mites.  You would need to hold antihistamines (zyrtec) for 3 days prior to the visit  Follow-up in 4-6 months or sooner if needed  I appreciate the opportunity to take part in Ronnie's care. Please do not hesitate to contact  me with questions.  Sincerely,   Prudy Feeler, MD Allergy/Immunology [facility]

## 2022-09-07 ENCOUNTER — Encounter: Payer: Self-pay | Admitting: Family Medicine

## 2022-09-07 ENCOUNTER — Ambulatory Visit (INDEPENDENT_AMBULATORY_CARE_PROVIDER_SITE_OTHER): Payer: Medicare Other | Admitting: Family Medicine

## 2022-09-07 ENCOUNTER — Other Ambulatory Visit: Payer: Self-pay

## 2022-09-07 VITALS — BP 140/110 | HR 94 | Temp 98.3°F | Resp 20 | Ht 72.0 in | Wt 361.8 lb

## 2022-09-07 DIAGNOSIS — I10 Essential (primary) hypertension: Secondary | ICD-10-CM | POA: Diagnosis not present

## 2022-09-07 DIAGNOSIS — H1013 Acute atopic conjunctivitis, bilateral: Secondary | ICD-10-CM | POA: Diagnosis not present

## 2022-09-07 DIAGNOSIS — J3089 Other allergic rhinitis: Secondary | ICD-10-CM

## 2022-09-07 DIAGNOSIS — J302 Other seasonal allergic rhinitis: Secondary | ICD-10-CM | POA: Insufficient documentation

## 2022-09-07 MED ORDER — EPINEPHRINE 0.3 MG/0.3ML IJ SOAJ: 0.3000 mg | Freq: Once | INTRAMUSCULAR | 2 refills | Status: AC

## 2022-09-07 NOTE — Progress Notes (Signed)
[facility] [place] [address] Dept: [phone]  FOLLOW UP NOTE  Patient ID: Craig Deloe., male    DOB: 02-23-1971  Age: 52 y.o. MRN: [id number] Date of Office Visit: 09/07/2022  Assessment  Chief Complaint: Allergy Testing (No concerns)  HPI Craig Arrona. is a 52 year old male with visit.  He was last seen in this clinic on 09/01/2022 by Dr. Nelva Bush for evaluation of allergic rhinitis.  His percutaneous environmental allergy skin testing was positive to dust mite only and he did not have intradermal testing at that time.  He is accompanied by his wife who assists with history.  At today's visit, he reports his allergic rhinitis has been poorly controlled with symptoms including nasal congestion, clear thick rhinorrhea, sneezing, and postnasal drainage.  He reports this has been occurring year-round with no seasonal flare or relief.  He continues a daily antihistamine, nasal saline rinses, and Ryaltris nasal spray with no relief of symptoms.  He reports that he is very cautious about taking medications that may increase his blood pressure and is looking for an alternative solution.  He is interested in possibly moving forward with allergen immunotherapy in the near future.  His current medications are listed in the chart.   Drug Allergies:  Allergies  Allergen Reactions   Zithromax [Azithromycin] Hypertension   Lisinopril Swelling    SWELLING REACTION UNSPECIFIED    Tessalon Perles [Benzonatate]     hypertension    Physical Exam: BP (!) 140/110   Pulse 94   Temp 98.3 F (36.8 C)   Resp 20   Ht 6' (1.829 m)   Wt (!) 361 lb 12.8 oz (164.1 kg)   SpO2 98%   BMI 49.07 kg/m    Physical Exam Vitals reviewed.  Constitutional:      Appearance: Normal appearance.  HENT:     Head: Normocephalic and atraumatic.     Right Ear: Tympanic membrane normal.     Left Ear: Tympanic membrane normal.     Nose:     Comments: Bilateral nares edematous and pale with clear nasal  drainage noted.  Pharynx normal.  Ears normal.  Eyes normal.    Mouth/Throat:     Pharynx: Oropharynx is clear.  Eyes:     Conjunctiva/sclera: Conjunctivae normal.  Cardiovascular:     Rate and Rhythm: Normal rate and regular rhythm.     Heart sounds: Normal heart sounds. No murmur heard. Pulmonary:     Effort: Pulmonary effort is normal.     Breath sounds: Normal breath sounds.     Comments: Lungs clear to auscultation Musculoskeletal:        General: Normal range of motion.     Cervical back: Normal range of motion and neck supple.  Skin:    General: Skin is warm and dry.  Neurological:     Mental Status: He is alert and oriented to person, place, and time.  Psychiatric:        Mood and Affect: Mood normal.        Behavior: Behavior normal.        Thought Content: Thought content normal.        Judgment: Judgment normal.     Diagnostics: Intradermal environmental allergy skin testing was positive to grass pollen, weed pollen, and molds with adequate control.  Assessment and Plan: 1. Seasonal and perennial allergic rhinitis   2. Allergic conjunctivitis of both eyes   3. Elevated blood pressure reading in office with diagnosis of  hypertension     Meds ordered this encounter  Medications   EPINEPHrine (EPIPEN 2-PAK) 0.3 mg/0.3 mL IJ SOAJ injection    Sig: Inject 0.3 mg into the muscle once for 1 dose.    Dispense:  1 each    Refill:  2    Patient Instructions  Allergic rhinitis Your allergy skin testing was positive to grass pollen, weed pollen and molds at today's visit. Avoidance measures are listed below Continue to avoid dust mites Continue cetirizine 10 mg once a day as needed for runny nose or itch Continue Ryaltris 2 sprays in each nostril twice a day for nasal symptoms Consider saline nasal rinses as needed for nasal symptoms. Use this before any medicated nasal sprays for best result Consider allergen immunotherapy if the treatment plan is not controlling  your symptoms Make an appointment for your first allergy injection on the way out today  Allergic conjunctivitis Some over the counter eye drops include Pataday one drop in each eye once a day as needed for red, itchy eyes OR Zaditor one drop in each eye twice a day as needed for red itchy eyes. Avoid eye drops that say red eye relief as they may contain medications that dry out your eyes.   Your blood pressure was elevated at today's visit.  Follow-up with your primary care provider for management of your blood pressure  Call the clinic if this treatment plan is not working well for you.  Follow up in 3 months or sooner if needed.   Return in about 3 months (around 12/08/2022), or if symptoms worsen or fail to improve.    Thank you for the opportunity to care for this patient.  Please do not hesitate to contact me with questions.  Gareth Morgan, FNP [facility]

## 2022-09-07 NOTE — Patient Instructions (Addendum)
Allergic rhinitis Your allergy skin testing was positive to grass pollen, weed pollen and molds at today's visit. Avoidance measures are listed below Continue to avoid dust mites Continue cetirizine 10 mg once a day as needed for runny nose or itch Continue Ryaltris 2 sprays in each nostril twice a day for nasal symptoms Consider saline nasal rinses as needed for nasal symptoms. Use this before any medicated nasal sprays for best result Consider allergen immunotherapy if the treatment plan is not controlling your symptoms Make an appointment for your first allergy injection on the way out today  Allergic conjunctivitis Some over the counter eye drops include Pataday one drop in each eye once a day as needed for red, itchy eyes OR Zaditor one drop in each eye twice a day as needed for red itchy eyes. Avoid eye drops that say red eye relief as they may contain medications that dry out your eyes.   Your blood pressure was elevated at today's visit.  Follow-up with your primary care provider for management of your blood pressure  Call the clinic if this treatment plan is not working well for you.  Follow up in 3 months or sooner if needed.   Control of Dust Mite Allergen Dust mites play a major role in allergic asthma and rhinitis. They occur in environments with high humidity wherever human skin is found. Dust mites absorb humidity from the atmosphere (ie, they do not drink) and feed on organic matter (including shed human and animal skin). Dust mites are a microscopic type of insect that you cannot see with the naked eye. High levels of dust mites have been detected from mattresses, pillows, carpets, upholstered furniture, bed covers, clothes, soft toys and any woven material. The principal allergen of the dust mite is found in its feces. A gram of dust may contain 1,000 mites and 250,000 fecal particles. Mite antigen is easily measured in the air during house cleaning activities. Dust mites do  not bite and do not cause harm to humans, other than by triggering allergies/asthma.  Ways to decrease your exposure to dust mites in your home:  1. Encase mattresses, box springs and pillows with a mite-impermeable barrier or cover  2. Wash sheets, blankets and drapes weekly in hot water (130 F) with detergent and dry them in a dryer on the hot setting.  3. Have the room cleaned frequently with a vacuum cleaner and a damp dust-mop. For carpeting or rugs, vacuuming with a vacuum cleaner equipped with a high-efficiency particulate air (HEPA) filter. The dust mite allergic individual should not be in a room which is being cleaned and should wait 1 hour after cleaning before going into the room.  4. Do not sleep on upholstered furniture (eg, couches).  5. If possible removing carpeting, upholstered furniture and drapery from the home is ideal. Horizontal blinds should be eliminated in the rooms where the person spends the most time (bedroom, study, television room). Washable vinyl, roller-type shades are optimal.  6. Remove all non-washable stuffed toys from the bedroom. Wash stuffed toys weekly like sheets and blankets above.  7. Reduce indoor humidity to less than 50%. Inexpensive humidity monitors can be purchased at most hardware stores. Do not use a humidifier as can make the problem worse and are not recommended.  Reducing Pollen Exposure The American Academy of Allergy, Asthma and Immunology suggests the following steps to reduce your exposure to pollen during allergy seasons. Do not hang sheets or clothing out to dry; pollen may  collect on these items. Do not mow lawns or spend time around freshly cut grass; mowing stirs up pollen. Keep windows closed at night.  Keep car windows closed while driving. Minimize morning activities outdoors, a time when pollen counts are usually at their highest. Stay indoors as much as possible when pollen counts or humidity is high and on windy days when  pollen tends to remain in the air longer. Use air conditioning when possible.  Many air conditioners have filters that trap the pollen spores. Use a HEPA room air filter to remove pollen form the indoor air you breathe.  Control of Mold Allergen Mold and fungi can grow on a variety of surfaces provided certain temperature and moisture conditions exist.  Outdoor molds grow on plants, decaying vegetation and soil.  The major outdoor mold, Alternaria and Cladosporium, are found in very high numbers during hot and dry conditions.  Generally, a late Summer - Fall peak is seen for common outdoor fungal spores.  Rain will temporarily lower outdoor mold spore count, but counts rise rapidly when the rainy period ends.  The most important indoor molds are Aspergillus and Penicillium.  Dark, humid and poorly ventilated basements are ideal sites for mold growth.  The next most common sites of mold growth are the bathroom and the kitchen.  Outdoor [employer] Use air conditioning and keep windows closed Avoid exposure to decaying vegetation. Avoid leaf raking. Avoid grain handling. Consider wearing a face mask if working in moldy areas.  Indoor Mold Control Maintain humidity below 50%. Clean washable surfaces with 5% bleach solution. Remove sources e.g. Contaminated carpets.

## 2022-09-11 ENCOUNTER — Telehealth: Payer: Self-pay | Admitting: Internal Medicine

## 2022-09-11 NOTE — Telephone Encounter (Signed)
Pt stated that Dr. Leta Baptist at ENT no longer accepts [facility].Pt requesting that Referral be sent else where. Please advise:

## 2022-09-11 NOTE — Telephone Encounter (Signed)
PT is calling to get a new referral to another ENT. The one we referred him to does not take his insurance. Please advise.

## 2022-09-12 NOTE — Telephone Encounter (Signed)
Let's try [facility]

## 2022-09-12 NOTE — Telephone Encounter (Signed)
Pt made aware of [name] recommendations: Referral was faxed to [facility] along with pt records. Pt made aware. Pt notified that if he has not heard anything from them within two weeks to give our office a call back: Pt verbalized understanding with all questions answered.

## 2022-10-02 ENCOUNTER — Encounter: Payer: Self-pay | Admitting: Family Medicine

## 2022-10-02 ENCOUNTER — Ambulatory Visit (INDEPENDENT_AMBULATORY_CARE_PROVIDER_SITE_OTHER): Payer: Medicare Other | Admitting: Family Medicine

## 2022-10-02 VITALS — BP 138/82 | HR 89 | Temp 98.1°F | Ht 72.0 in | Wt 361.0 lb

## 2022-10-02 DIAGNOSIS — E559 Vitamin D deficiency, unspecified: Secondary | ICD-10-CM | POA: Diagnosis not present

## 2022-10-02 DIAGNOSIS — Z130 Encounter for screening for diseases of the blood and blood-forming organs and certain disorders involving the immune mechanism: Secondary | ICD-10-CM

## 2022-10-02 DIAGNOSIS — Z131 Encounter for screening for diabetes mellitus: Secondary | ICD-10-CM

## 2022-10-02 DIAGNOSIS — I10 Essential (primary) hypertension: Secondary | ICD-10-CM

## 2022-10-02 DIAGNOSIS — R351 Nocturia: Secondary | ICD-10-CM

## 2022-10-02 DIAGNOSIS — Z13220 Encounter for screening for lipoid disorders: Secondary | ICD-10-CM

## 2022-10-02 DIAGNOSIS — Z125 Encounter for screening for malignant neoplasm of prostate: Secondary | ICD-10-CM

## 2022-10-02 DIAGNOSIS — Z0000 Encounter for general adult medical examination without abnormal findings: Secondary | ICD-10-CM

## 2022-10-02 DIAGNOSIS — E785 Hyperlipidemia, unspecified: Secondary | ICD-10-CM

## 2022-10-02 DIAGNOSIS — R7303 Prediabetes: Secondary | ICD-10-CM | POA: Diagnosis not present

## 2022-10-02 DIAGNOSIS — Z136 Encounter for screening for cardiovascular disorders: Secondary | ICD-10-CM

## 2022-10-02 DIAGNOSIS — Z1329 Encounter for screening for other suspected endocrine disorder: Secondary | ICD-10-CM

## 2022-10-02 LAB — MICROALBUMIN / CREATININE URINE RATIO
Creatinine,U: 422 mg/dL
Microalb Creat Ratio: 6.9 mg/g (ref 0.0–30.0)
Microalb, Ur: 28.9 mg/dL — ABNORMAL HIGH (ref 0.0–1.9)

## 2022-10-02 LAB — COMPREHENSIVE METABOLIC PANEL
ALT: 30 U/L (ref 0–53)
AST: 27 U/L (ref 0–37)
Albumin: 4 g/dL (ref 3.5–5.2)
Alkaline Phosphatase: 109 U/L (ref 39–117)
BUN: 10 mg/dL (ref 6–23)
CO2: 25 mEq/L (ref 19–32)
Calcium: 8.9 mg/dL (ref 8.4–10.5)
Chloride: 105 mEq/L (ref 96–112)
Creatinine, Ser: 1 mg/dL (ref 0.40–1.50)
GFR: 87.61 mL/min (ref >60.00)
Glucose, Bld: 87 mg/dL (ref 70–99)
Potassium: 4.1 mEq/L (ref 3.5–5.1)
Sodium: 136 mEq/L (ref 135–145)
Total Bilirubin: 0.5 mg/dL (ref 0.2–1.2)
Total Protein: 7.4 g/dL (ref 6.0–8.3)

## 2022-10-02 LAB — LIPID PANEL
Cholesterol: 170 mg/dL (ref 0–200)
HDL: 33.7 mg/dL — ABNORMAL LOW (ref >39.00)
LDL Cholesterol: 106 mg/dL — ABNORMAL HIGH (ref 0–99)
NonHDL: 135.97
Total CHOL/HDL Ratio: 5
Triglycerides: 151 mg/dL — ABNORMAL HIGH (ref 0.0–149.0)
VLDL: 30.2 mg/dL (ref 0.0–40.0)

## 2022-10-02 LAB — PSA, MEDICARE: PSA: 0.88 ng/ml (ref 0.10–4.00)

## 2022-10-02 LAB — HEMOGLOBIN A1C: Hgb A1c MFr Bld: 5.9 % (ref 4.6–6.5)

## 2022-10-02 LAB — VITAMIN D 25 HYDROXY (VIT D DEFICIENCY, FRACTURES): VITD: 24.09 ng/mL — ABNORMAL LOW (ref 30.00–100.00)

## 2022-10-02 MED ORDER — VERAPAMIL HCL ER 180 MG PO TBCR: 360.0000 mg | EXTENDED_RELEASE_TABLET | Freq: Every day | ORAL | 2 refills | Status: DC

## 2022-10-02 MED ORDER — DOXAZOSIN MESYLATE 8 MG PO TABS: 8.0000 mg | ORAL_TABLET | Freq: Every day | ORAL | 2 refills | Status: DC

## 2022-10-02 MED ORDER — OLMESARTAN MEDOXOMIL 20 MG PO TABS: 20.0000 mg | ORAL_TABLET | Freq: Every day | ORAL | 2 refills | Status: DC

## 2022-10-02 NOTE — Progress Notes (Unsigned)
Subjective:  Patient ID: Craig Clark., male    DOB: 01-Jan-1971  Age: 52 y.o. MRN: [id number]  CC:  Chief Complaint  Patient presents with   Annual Exam   Medication Problem    Pt wants to discuss any alternatives to taking prednisone because it has caused weight gain.      HPI Craig Guzman. presents for Annual Exam  Followed by allergist, Dr. Nelva Bush with seasonal allergic rhinitis.  Prednisone has been given for acute visits with respiratory infections, asymptomatic currently.  We discussed trying to avoid prednisone if possible unless significant bronchospasm, other treatment options but would recommend in person visit if he were to have cough or wheezing to discuss treatment plan.  Gastroenterology, [name], Crohn's, postgastrectomy malabsorption and dumping syndrome.  Hypertension: Treated with doxazosin, olmesartan, verapamil, no new side effects.  Home readings: normal at home - 130/80 range. Elevated BP at allergist - small cuff.  BP Readings from Last 3 Encounters:  10/02/22 138/82  09/07/22 (!) 140/110  09/01/22 (!) 180/108   Lab Results  Component Value Date   CREATININE 1.11 03/31/2022   Prediabetes: Weight stable.  Has seen nutritionist previously with his Crohn's and postsurgical dumping syndrome.  He is on bariatric supplement. Lab Results  Component Value Date   HGBA1C 5.9 03/31/2022   Wt Readings from Last 3 Encounters:  10/02/22 (!) 361 lb (163.7 kg)  09/07/22 (!) 361 lb 12.8 oz (164.1 kg)  09/01/22 (!) 358 lb 12.8 oz (162.8 kg)   Rizatriptan for migraines - intestinal issues at times - last dose month and a half ago.   Vitamin D deficiency On supplementation, 50,000 units previously.  Last level low in February, not sure if on supplement at that time - now taking weekly.  Last vitamin D Lab Results  Component Value Date   VD25OH 11.94 (L) 08/22/2022   Denies depression, anhedonia, fatigue with appetite due to his GI issues discussed  previously.    10/02/2022   10:20 AM 08/16/2022   10:36 AM 03/31/2022   11:15 AM 09/28/2021    2:11 PM 03/30/2021   10:14 AM  Depression screen PHQ 2/9  Decreased Interest 0 0 0 0 0  Down, Depressed, Hopeless 0 0 0 0 0  PHQ - 2 Score 0 0 0 0 0  Altered sleeping 0  0 0   Tired, decreased energy 1  2 0   Change in appetite 3  3 0   Feeling bad or failure about yourself  0  0 0   Trouble concentrating 0  0 0   Moving slowly or fidgety/restless 0  0 0   Suicidal thoughts 0  0 0   PHQ-9 Score 4  5 0     Health Maintenance  Topic Date Due   Zoster Vaccines- Shingrix (1 of 2) Never done   COVID-19 Vaccine (4 - 2023-24 season) 03/03/2022   INFLUENZA VACCINE  02/01/2023   Medicare Annual Wellness (AWV)  08/17/2023   COLONOSCOPY (Pts 45-28yrs Insurance coverage will need to be confirmed)  01/25/2028   DTaP/Tdap/Td (3 - Td or Tdap) 09/29/2031   Hepatitis C Screening  Completed   HIV Screening  Completed   HPV VACCINES  Aged Out  Colonoscopy 2019, ongoing care with GI d/t Crohns.   Prostate: does not have family history of prostate cancer The natural history of prostate cancer and ongoing controversy regarding screening and potential treatment outcomes of prostate cancer has been discussed with the patient.  The meaning of a false positive PSA and a false negative PSA has been discussed. He indicates understanding of the limitations of this screening test and wishes to proceed with screening PSA testing. Lab Results  Component Value Date   PSA1 0.3 11/08/2017   PSA1 0.3 11/06/2016   PSA 0.17 09/28/2021   PSA 0.2 03/25/2016   PSA 0.20 09/19/2011    Immunization History  Administered Date(s) Administered   Hepatitis A 09/01/2011, 03/22/2012   Hepatitis A, Adult 09/01/2011, 03/22/2012   Influenza,inj,Quad PF,6+ Mos 05/01/2013, 04/07/2015, 06/26/2016, 04/12/2018, 03/06/2019, 09/28/2021, 03/31/2022   Influenza-Unspecified 03/16/2020, 03/30/2021   Moderna Sars-Covid-2 Vaccination  10/30/2019, 11/27/2019, 07/17/2020   PPD Test 05/19/2011, 12/24/2012, 12/28/2014   Pneumococcal Polysaccharide-23 09/01/2011   Tdap 09/01/2011, 09/28/2021  Shingrix and COVID-vaccine discussed at pharmacy at his September visit. Did not receive.   No results found. Due for optho eval.   Dental:overdue.   Alcohol:none  Tobacco: none.   Exercise:walking up and down street, every other day. 71min.  Body mass index is 48.96 kg/m. Lab Results  Component Value Date   CHOL 161 03/31/2022   HDL 32.20 (L) 03/31/2022   LDLCALC 105 (H) 03/31/2022   TRIG 123.0 03/31/2022   CHOLHDL 5 03/31/2022      History Patient Active Problem List   Diagnosis Date Noted   Seasonal and perennial allergic rhinitis 09/07/2022   Allergic conjunctivitis of both eyes 09/07/2022   Feeding problems 12/02/2021   Abdominal migraine, not intractable ? 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   History of migraine headaches    HTN (hypertension)    Kidney stone 09/20/2012   Migraine    Obesity    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    Pneumonia 02/27/2014   ED   Postsurgical dumping syndrome 02/14/2019   Sleep apnea    has cpap- does not wear    Past Surgical History:  Procedure Laterality Date   BALLOON DILATION N/A 12/06/2017   Procedure: BALLOON DILATION;  Surgeon: Gatha Mayer, MD;  Location: [facility];  Service: Endoscopy;  Laterality: N/A;   BIOPSY  06/21/2018   Procedure: BIOPSY;  Surgeon: Gatha Mayer, MD;  Location: [facility];  Service: Endoscopy;;   CHOLECYSTECTOMY  2011   [place] N/A 12/09/2018   Procedure: CIRCUMCISION ADULT;  Surgeon: Lucas Mallow, MD;  Location: [facility];  Service: Urology;  Laterality: N/A;   COLONOSCOPY  07/26/11   Crohn's colitis, ileitis   ESOPHAGOGASTRODUODENOSCOPY  05/04/11, 07/26/11   granulomatous gastritis - Crohn's   ESOPHAGOGASTRODUODENOSCOPY (EGD) WITH PROPOFOL N/A 03/28/2017   Procedure: ESOPHAGOGASTRODUODENOSCOPY (EGD) WITH PROPOFOL  with balloon dilation of duodenum;  Surgeon: [name], MD;  Location: [facility];  Service: Gastroenterology;  Laterality: N/A;   ESOPHAGOGASTRODUODENOSCOPY (EGD) WITH PROPOFOL N/A 12/06/2017   Procedure: ESOPHAGOGASTRODUODENOSCOPY (EGD) WITH PROPOFOL;  Surgeon: Gatha Mayer, MD;  Location: [facility];  Service: Endoscopy;  Laterality: N/A;   ESOPHAGOGASTRODUODENOSCOPY (EGD) WITH PROPOFOL N/A 06/21/2018   Procedure: ESOPHAGOGASTRODUODENOSCOPY (EGD) WITH PROPOFOL;  Surgeon: Gatha Mayer, MD;  Location: [facility];  Service: Endoscopy;  Laterality: N/A;   ETHMOIDECTOMY N/A 11/27/2018   Procedure: ENDOSCOPIC TOTAL ETHMOIDECTOMY;  Surgeon: Leta Baptist, MD;  Location: [place];  Service: ENT;   Laterality: N/A;   FLEXIBLE SIGMOIDOSCOPY     GASTROJEJUNOSTOMY  01/2018   MULTIPLE TOOTH EXTRACTIONS  2005   NASAL SEPTOPLASTY W/ TURBINOPLASTY Bilateral 11/27/2018   Procedure: NASAL SEPTOPLASTY WITH BILATERAL TURBINATE REDUCTION;  Surgeon: Leta Baptist, MD;  Location: [place];  Service: ENT;  Laterality: Bilateral;   SHOULDER SURGERY     right   SINUS ENDO WITH FUSION N/A 11/27/2018   Procedure: SINUS ENDO WITH FUSION;  Surgeon: Leta Baptist, MD;  Location: [place];  Service: ENT;  Laterality: N/A;   UPPER GASTROINTESTINAL ENDOSCOPY     VASECTOMY     bilateral w/lysis of penile adhesions   Allergies  Allergen Reactions   Zithromax [Azithromycin] Hypertension   Lisinopril Swelling    SWELLING REACTION UNSPECIFIED    Tessalon Perles [Benzonatate]     hypertension   Prior to Admission medications   Medication Sig Start Date End Date Taking? Authorizing Provider  albuterol (VENTOLIN HFA) 108 (90 Base) MCG/ACT inhaler Inhale 1-2 puffs into the lungs every 6 (six) hours as needed for wheezing or shortness of breath. 08/04/22  Yes Louretta Shorten, [place] N, FNP  Calcium Carb-Cholecalciferol (CALCIUM 1000 + D PO) Take by mouth.   Yes [provider]  cyanocobalamin (,VITAMIN B-12,) 1000 MCG/ML injection INJECT 1 ML (1,000 MCG TOTAL) INTO THE MUSCLE ONCE FOR 1 DOSE. PLEASE PROVIDE 3 CC SYRINGE #12, 1 1/2 [facility] 23 GAUGE NEEDLE #12 10/26/21  Yes Gatha Mayer, MD  cyclobenzaprine (FLEXERIL) 10 MG tablet Take 10 mg by mouth as needed. 07/11/13  Yes [provider]  Multiple Vitamins-Calcium (MULTI-DAY/CALCIUM/EXTRA IRON PO) Take by mouth.   Yes [provider]  olmesartan (BENICAR) 20 MG tablet TAKE ONE TABLET BY MOUTH EVERY DAY 08/14/22  Yes [name], MD  Olopatadine-Mometasone [facility] [id number] MCG/ACT SUSP Place 2 sprays into the nose 2 (two) times daily as needed (runny/stuffy nose). 09/01/22  Yes Kennith Gain, MD  rizatriptan (MAXALT-MLT) 10 MG disintegrating tablet  TAKE 1 TABLET 3 TIME A DA Y AS NEEDED FOR MIGRAINE 01/26/20  Yes Gatha Mayer, MD  verapamil (CALAN-SR) 180 MG CR tablet TAKE TWO TABLETS BY MOUTH EVERY DAY 06/30/22  Yes [name], MD  Vitamin D, Ergocalciferol, (DRISDOL) 1.25 MG (50000 UNIT) CAPS capsule Take 1 capsule (50,000 Units total) by mouth every 7 (seven) days for 12 doses. 08/25/22 11/11/22 Yes Gatha Mayer, MD  doxazosin (CARDURA) 8 MG tablet TAKE ONE TABLET BY MOUTH AT BEDTIME 04/14/22   [name], MD   Social History  Socioeconomic History   Marital status: Married    Spouse name: Not on file   Number of children: 3   Years of education: Some college   Highest education level: Not on file  Occupational History   Occupation: Environmental health practitioner: [employer]  Tobacco Use   Smoking status: Former    Packs/day: 0.75    Years: 21.00    Additional pack years: 0.00    Total pack years: 15.75    Types: Cigarettes    Quit date: 12/01/2017    Years since quitting: 4.8    Passive exposure: Current (once in a while)   Smokeless tobacco: Former    Types: Snuff   Tobacco comments:    Counseling sheet given in exam room   Vaping Use   Vaping Use: Never used  Substance and Sexual Activity   Alcohol use: Not Currently   Drug use: No   Sexual activity: Yes    Partners: Female  Other Topics Concern   Not on file  Social History Narrative   Married. Education: [employer].    Lives at home w/ his wife and Engineer, site for [employer] -on disability    Right-handed   Caffeine: 3 sodas per day   Occasional alcohol no drugs   Former smoker former snuff user   Social Determinants of Health   Financial Resource Strain: Low Risk  (08/16/2022)   Overall Financial Resource Strain (CARDIA)    Difficulty of Paying Living Expenses: Not hard at all  Food Insecurity: No Food Insecurity (08/16/2022)   Hunger Vital Sign    Worried About Running Out of Food in the Last Year: Never true    [place] in the Last  Year: Never true  Transportation Needs: No Transportation Needs (08/16/2022)   PRAPARE - Hydrologist (Medical): No    Lack of Transportation (Non-Medical): No  Physical Activity: Inactive (08/16/2022)   Exercise Vital Sign    Days of Exercise per Week: 0 days    Minutes of Exercise per Session: 0 min  Stress: No Stress Concern Present (08/16/2022)   [place]    Feeling of Stress : Not at all  Social Connections: Moderately Isolated (08/16/2022)   Social Connection and Isolation Panel [NHANES]    Frequency of Communication with Friends and Family: More than three times a week    Frequency of Social Gatherings with Friends and Family: More than three times a week    Attends Religious Services: Never    Marine scientist or Organizations: No    Attends Archivist Meetings: Never    Marital Status: Married  Human resources officer Violence: Not At Risk (08/16/2022)   Humiliation, Afraid, Rape, and Kick questionnaire    Fear of Current or Ex-Partner: No    Emotionally Abused: No    Physically Abused: No    Sexually Abused: No    Review of Systems 13 point review of systems per patient health survey noted.  Negative other than as indicated above or in HPI.    Objective:   Vitals:   10/02/22 1022 10/02/22 1037  BP: (!) 142/92 138/82  Pulse: 89   Temp: 98.1 F (36.7 C)   TempSrc: Temporal   SpO2: 100%   Weight: (!) 361 lb (163.7 kg)   Height: 6' (1.829 m)      Physical Exam Vitals reviewed.  Constitutional:  Appearance: He is well-developed.  HENT:     Head: Normocephalic and atraumatic.     Right Ear: External ear normal.     Left Ear: External ear normal.  Eyes:     Conjunctiva/sclera: Conjunctivae normal.     Pupils: Pupils are equal, round, and reactive to light.  Neck:     Thyroid: No thyromegaly.  Cardiovascular:     Rate and Rhythm: Normal rate and  regular rhythm.     Heart sounds: Normal heart sounds.  Pulmonary:     Effort: Pulmonary effort is normal. No respiratory distress.     Breath sounds: Normal breath sounds. No wheezing.  Abdominal:     General: There is no distension.     Palpations: Abdomen is soft.     Tenderness: There is no abdominal tenderness.  Musculoskeletal:        General: No tenderness. Normal range of motion.     Cervical back: Normal range of motion and neck supple.  Lymphadenopathy:     Cervical: No cervical adenopathy.  Skin:    General: Skin is warm and dry.  Neurological:     Mental Status: He is alert and oriented to person, place, and time.     Deep Tendon Reflexes: Reflexes are normal and symmetric.  Psychiatric:        Behavior: Behavior normal.        Assessment & Plan:  Craig Mole. is a 52 y.o. male . Annual physical exam - Plan: Comp Met (CMET), Lipid panel, HgB A1c, Urine Microalbumin w/creat. ratio  - -anticipatory guidance as below in AVS, screening labs above. Health maintenance items as above in HPI discussed/recommended as applicable.   Prediabetes - Plan: HgB A1c  -Diet, exercise as able for weight management, check updated labs.  Hyperlipidemia, unspecified hyperlipidemia type - Plan: Lipid panel  -Check labs with adjustment in plan or medication recommendations accordingly.  Hypertension, unspecified type - Plan: Lipid panel  -Stable in office, elevated outside reading likely due to small cuff.  Monitor home readings with RTC precautions.  Continue same regimen for now.  Nocturia - Plan: doxazosin (CARDURA) 8 MG tablet  -Stable, continue doxazosin.  Essential hypertension - Plan: doxazosin (CARDURA) 8 MG tablet, olmesartan (BENICAR) 20 MG tablet, verapamil (CALAN-SR) 180 MG CR tablet   - As above  Vitamin D deficiency - Plan: Vitamin D (25 hydroxy)  -Now on replacement, check updated readings.  Screening for prostate cancer - Plan: PSA, Medicare ( [place]  only)   Meds ordered this encounter  Medications   doxazosin (CARDURA) 8 MG tablet    Sig: Take 1 tablet (8 mg total) by mouth at bedtime.    Dispense:  90 tablet    Refill:  2   olmesartan (BENICAR) 20 MG tablet    Sig: Take 1 tablet (20 mg total) by mouth daily.    Dispense:  90 tablet    Refill:  2   verapamil (CALAN-SR) 180 MG CR tablet    Sig: Take 2 tablets (360 mg total) by mouth daily.    Dispense:  180 tablet    Refill:  2   Patient Instructions  I recommend shingles vaccine at your pharmacy.  Schedule eye doctor and dentist appointments.   Take care!  Preventive Care 46-67 Years Old, Male Preventive care refers to lifestyle choices and visits with your health care provider that can promote health and wellness. Preventive care visits are also called wellness exams. What can I  expect for my preventive care visit? Counseling During your preventive care visit, your health care provider may ask about your: Medical history, including: Past medical problems. Family medical history. Current health, including: Emotional well-being. Home life and relationship well-being. Sexual activity. Lifestyle, including: Alcohol, nicotine or tobacco, and drug use. Access to firearms. Diet, exercise, and sleep habits. Safety issues such as seatbelt and bike helmet use. Sunscreen use. Work and work Statistician. Physical exam Your health care provider will check your: Height and weight. These may be used to calculate your BMI (body mass index). BMI is a measurement that tells if you are at a healthy weight. Waist circumference. This measures the distance around your waistline. This measurement also tells if you are at a healthy weight and may help predict your risk of certain diseases, such as type 2 diabetes and high blood pressure. Heart rate and blood pressure. Body temperature. Skin for abnormal spots. What immunizations do I need?  Vaccines are usually given at various ages,  according to a schedule. Your health care provider will recommend vaccines for you based on your age, medical history, and lifestyle or other factors, such as travel or where you work. What tests do I need? Screening Your health care provider may recommend screening tests for certain conditions. This may include: Lipid and cholesterol levels. Diabetes screening. This is done by checking your blood sugar (glucose) after you have not eaten for a while (fasting). Hepatitis B test. Hepatitis C test. HIV (human immunodeficiency virus) test. STI (sexually transmitted infection) testing, if you are at risk. Lung cancer screening. Prostate cancer screening. Colorectal cancer screening. Talk with your health care provider about your test results, treatment options, and if necessary, the need for more tests. Follow these instructions at home: Eating and drinking  Eat a diet that includes fresh fruits and vegetables, whole grains, lean protein, and low-fat dairy products. Take vitamin and mineral supplements as recommended by your health care provider. Do not drink alcohol if your health care provider tells you not to drink. If you drink alcohol: Limit how much you have to 0-2 drinks a day. Know how much alcohol is in your drink. In the [place] one drink equals one 12 oz bottle of beer (355 mL), one 5 oz glass of wine (148 mL), or one 1 oz glass of hard liquor (44 mL). Lifestyle Brush your teeth every morning and night with fluoride toothpaste. Floss one time each day. Exercise for at least 30 minutes 5 or more days each week. Do not use any products that contain nicotine or tobacco. These products include cigarettes, chewing tobacco, and vaping devices, such as e-cigarettes. If you need help quitting, ask your health care provider. Do not use drugs. If you are sexually active, practice safe sex. Use a condom or other form of protection to prevent STIs. Take aspirin only as told by your health care  provider. Make sure that you understand how much to take and what form to take. Work with your health care provider to find out whether it is safe and beneficial for you to take aspirin daily. Find healthy ways to manage stress, such as: Meditation, yoga, or listening to music. Journaling. Talking to a trusted person. Spending time with friends and family. Minimize exposure to UV radiation to reduce your risk of skin cancer. Safety Always wear your seat belt while driving or riding in a vehicle. Do not drive: If you have been drinking alcohol. Do not ride with someone who has been  drinking. When you are tired or distracted. While texting. If you have been using any mind-altering substances or drugs. Wear a helmet and other protective equipment during sports activities. If you have firearms in your house, make sure you follow all gun safety procedures. What's next? Go to your health care provider once a year for an annual wellness visit. Ask your health care provider how often you should have your eyes and teeth checked. Stay up to date on all vaccines. This information is not intended to replace advice given to you by your health care provider. Make sure you discuss any questions you have with your health care provider. Document Revised: 12/15/2020 Document Reviewed: 12/15/2020 Elsevier Patient Education  [place],   Merri Ray, MD [place] 10/02/22 11:22 AM

## 2022-10-02 NOTE — Patient Instructions (Addendum)
I recommend shingles vaccine at your pharmacy.  Schedule eye doctor and dentist appointments.   Take care!  Preventive Care 27-51 Years Old, Male Preventive care refers to lifestyle choices and visits with your health care provider that can promote health and wellness. Preventive care visits are also called wellness exams. What can I expect for my preventive care visit? Counseling During your preventive care visit, your health care provider may ask about your: Medical history, including: Past medical problems. Family medical history. Current health, including: Emotional well-being. Home life and relationship well-being. Sexual activity. Lifestyle, including: Alcohol, nicotine or tobacco, and drug use. Access to firearms. Diet, exercise, and sleep habits. Safety issues such as seatbelt and bike helmet use. Sunscreen use. Work and work Statistician. Physical exam Your health care provider will check your: Height and weight. These may be used to calculate your BMI (body mass index). BMI is a measurement that tells if you are at a healthy weight. Waist circumference. This measures the distance around your waistline. This measurement also tells if you are at a healthy weight and may help predict your risk of certain diseases, such as type 2 diabetes and high blood pressure. Heart rate and blood pressure. Body temperature. Skin for abnormal spots. What immunizations do I need?  Vaccines are usually given at various ages, according to a schedule. Your health care provider will recommend vaccines for you based on your age, medical history, and lifestyle or other factors, such as travel or where you work. What tests do I need? Screening Your health care provider may recommend screening tests for certain conditions. This may include: Lipid and cholesterol levels. Diabetes screening. This is done by checking your blood sugar (glucose) after you have not eaten for a while  (fasting). Hepatitis B test. Hepatitis C test. HIV (human immunodeficiency virus) test. STI (sexually transmitted infection) testing, if you are at risk. Lung cancer screening. Prostate cancer screening. Colorectal cancer screening. Talk with your health care provider about your test results, treatment options, and if necessary, the need for more tests. Follow these instructions at home: Eating and drinking  Eat a diet that includes fresh fruits and vegetables, whole grains, lean protein, and low-fat dairy products. Take vitamin and mineral supplements as recommended by your health care provider. Do not drink alcohol if your health care provider tells you not to drink. If you drink alcohol: Limit how much you have to 0-2 drinks a day. Know how much alcohol is in your drink. In the [place] one drink equals one 12 oz bottle of beer (355 mL), one 5 oz glass of wine (148 mL), or one 1 oz glass of hard liquor (44 mL). Lifestyle Brush your teeth every morning and night with fluoride toothpaste. Floss one time each day. Exercise for at least 30 minutes 5 or more days each week. Do not use any products that contain nicotine or tobacco. These products include cigarettes, chewing tobacco, and vaping devices, such as e-cigarettes. If you need help quitting, ask your health care provider. Do not use drugs. If you are sexually active, practice safe sex. Use a condom or other form of protection to prevent STIs. Take aspirin only as told by your health care provider. Make sure that you understand how much to take and what form to take. Work with your health care provider to find out whether it is safe and beneficial for you to take aspirin daily. Find healthy ways to manage stress, such as: Meditation, yoga, or listening to music.  Journaling. Talking to a trusted person. Spending time with friends and family. Minimize exposure to UV radiation to reduce your risk of skin cancer. Safety Always wear  your seat belt while driving or riding in a vehicle. Do not drive: If you have been drinking alcohol. Do not ride with someone who has been drinking. When you are tired or distracted. While texting. If you have been using any mind-altering substances or drugs. Wear a helmet and other protective equipment during sports activities. If you have firearms in your house, make sure you follow all gun safety procedures. What's next? Go to your health care provider once a year for an annual wellness visit. Ask your health care provider how often you should have your eyes and teeth checked. Stay up to date on all vaccines. This information is not intended to replace advice given to you by your health care provider. Make sure you discuss any questions you have with your health care provider. Document Revised: 12/15/2020 Document Reviewed: 12/15/2020 Elsevier Patient Education  [place].

## 2022-10-03 ENCOUNTER — Encounter: Payer: Self-pay | Admitting: Family Medicine

## 2022-10-18 ENCOUNTER — Other Ambulatory Visit: Payer: Self-pay | Admitting: Internal Medicine

## 2022-10-23 ENCOUNTER — Encounter: Payer: Self-pay | Admitting: Internal Medicine

## 2022-10-23 ENCOUNTER — Ambulatory Visit: Payer: Medicare Other | Admitting: Internal Medicine

## 2022-10-23 ENCOUNTER — Telehealth: Payer: Self-pay

## 2022-10-23 VITALS — BP 130/88 | HR 87 | Ht 72.0 in | Wt 361.4 lb

## 2022-10-23 DIAGNOSIS — K911 Postgastric surgery syndromes: Secondary | ICD-10-CM

## 2022-10-23 DIAGNOSIS — R1115 Cyclical vomiting syndrome unrelated to migraine: Secondary | ICD-10-CM | POA: Diagnosis not present

## 2022-10-23 DIAGNOSIS — E559 Vitamin D deficiency, unspecified: Secondary | ICD-10-CM

## 2022-10-23 DIAGNOSIS — Z903 Acquired absence of stomach [part of]: Secondary | ICD-10-CM | POA: Diagnosis not present

## 2022-10-23 DIAGNOSIS — K912 Postsurgical malabsorption, not elsewhere classified: Secondary | ICD-10-CM

## 2022-10-23 DIAGNOSIS — K50019 Crohn's disease of small intestine with unspecified complications: Secondary | ICD-10-CM

## 2022-10-23 DIAGNOSIS — R0982 Postnasal drip: Secondary | ICD-10-CM | POA: Diagnosis not present

## 2022-10-23 NOTE — Patient Instructions (Signed)
Keep up the good work on the dumping diet.  Continue working to eliminate sugary soda's.  If you don't hear from ENT in a week or so call us back.  _______________________________________________________  If your blood pressure at your visit was 140/90 or greater, please contact your primary care physician to follow up on this.  _______________________________________________________  If you are age 51 or older, your body mass index should be between 23-30. Your Body mass index is 49.01 kg/m. If this is out of the aforementioned range listed, please consider follow up with your Primary Care Provider.  If you are age 62 or younger, your body mass index should be between 19-25. Your Body mass index is 49.01 kg/m. If this is out of the aformentioned range listed, please consider follow up with your Primary Care Provider.   ________________________________________________________  The [place] [facility] providers would like to encourage you to use [facility] to communicate with providers for non-urgent requests or questions.  Due to long hold times on the telephone, sending your provider a message by [facility] may be a faster and more efficient way to get a response.  Please allow 48 business hours for a response.  Please remember that this is for non-urgent requests.  _______________________________________________________  I appreciate the opportunity to care for you. Stan Head, MD, [facility]

## 2022-10-23 NOTE — Progress Notes (Signed)
Craig Guzman. 51 y.o. [birth year] [id number]  Assessment & Plan:   Encounter Diagnoses  Name Primary?   Postgastrectomy malabsorption Yes   Postsurgical dumping syndrome    Postnasal drip    Gastroduodenal Crohn's disease with complication    Persistent vomiting    Vitamin D deficiency     He seems improved in the past 2 months.  Diet change has helped.  He did describe an episode of possible slight vertigo We will look into the ENT referral again, he knows to contact me if that does not happen and also to let me know when his appointment is.  We did not make a follow-up with me right now he will continue on the post dumping diet.  Agree with allergy treatment for postnasal drip which can help.  Dr. Chilton Si saw him and reassessed his vitamin D level that was low and has plans to recheck it again in the future.  I had started him on 50,000 units weekly replacement for his extremely low vitamin D level.  After this I think he is going to need some regular supplementation at a lower dose level.  I have encouraged him to continue to try to stop sodas and sugary beverages.  Note A1c 5.9% prediabetic.   CC: Craig Flood, MD   Subjective:  Gastroenterology summary   Gastroduodenal Crohn's disease: Granulomatous gastritis on EGD in late 2012. Started on steroids after workup for other causes negative. Subsequently had EGD and colonoscopy which showed granulomatous gastritis, and inflammatory changes consistent with Crohn's disease in the terminal ileum and cecal area. Has had clinical response to prednisone. TPMT Phenotype is normal - 08/08/2011 - 6 MP begun 6TG level below therapeutic at 153 and level below toxic at 1189 - increase to 150/day 12/20/2011 6 TG therapeutic  2016 - CimziaTx 11/04/2012 - IBD panel not consistent with Crohn's disease July 2014 Humira started - OFF early 2016 ($$) 2015 4 hr GES NL - improved (after Tx of inflammation) 2019 surgical resection of  gastroduodenal stricture 08/2018 stopped Stelara and 6 MP ? Causing sxs CT scan 08/08/2018 abdomen and pelvis with contrast postoperative changes nothing else Domperidone (not helpful) and Reglan (intolerant) 2023 saw dietitian regarding dumping syndrome did not keep follow-up 08/22/2022-office visit Craig Guzman resuming post dumping diet referral to ENT for questionable in her ear abnormalities that could cause vomiting Crohn's ileocolitis? Only seen at colonoscopy in  2012.  Never again.  Not on cross-sectional imaging either    Postsurgical dumping syndrome Postgastrectomy malabsorption B12 deficiency Vitamin D deficiency     Chief Complaint: Follow-up of Crohn's disease and GI dysfunction  HPI Craig Guzman and his wife Craig Guzman returns for follow-up after seeing 08/22/2022.  We had referred to ENT but his former otolaryngologist Dr. Rae Guzman was not on his insurance anymore and we made a referral to a different practice but he has not heard from them and has not been seen yet.  He is actually improved by following post dumping diet more strictly.  He eats 2-3 times a day and smaller meals rather than 1 large meal a day.  He has not vomited since I saw him in February.  Recall that he does better if he lies down and tends to vomit when upright which is why I recommended ENT evaluation for possible inner ear disturbance.  He did have 1 episode where it felt like the room was spinning somewhat when he was rising up.  No vomiting with that  as I mentioned.  He has seen the allergist and tried topical therapies for postnasal drip i.e. inhalation steroids etc. and have been helping so they are considering allergy shots though it is costly they are trying to save the money to get started on that.  No other new issues.  He says he still has not been able to stop drinking sodas.  Wt Readings from Last 3 Encounters:  10/23/22 (!) 361 lb 6 oz (163.9 kg)  10/02/22 (!) 361 lb (163.7 kg)  09/07/22 (!) 361 lb 12.8  oz (164.1 kg)        Last vitamin D Lab Results  Component Value Date   VD25OH 24.09 (L) 10/02/2022   Lab Results  Component Value Date   HGBA1C 5.9 10/02/2022     Allergies  Allergen Reactions   Zithromax [Azithromycin] Hypertension   Lisinopril Swelling    SWELLING REACTION UNSPECIFIED    Tessalon Perles [Benzonatate]     hypertension    Current Outpatient Medications:    albuterol (VENTOLIN HFA) 108 (90 Base) MCG/ACT inhaler, Inhale 1-2 puffs into the lungs every 6 (six) hours as needed for wheezing or shortness of breath., Disp: 18 g, Rfl: 0   Calcium Carb-Cholecalciferol (CALCIUM 1000 + D PO), Take by mouth., Disp: , Rfl:    cyanocobalamin (VITAMIN B12) 1000 MCG/ML injection, INJECT INTRAMuscularly ONCE FOR A DOSE, Disp: 3 mL, Rfl: 0   cyclobenzaprine (FLEXERIL) 10 MG tablet, Take 10 mg by mouth as needed., Disp: , Rfl:    doxazosin (CARDURA) 8 MG tablet, Take 1 tablet (8 mg total) by mouth at bedtime., Disp: 90 tablet, Rfl: 2   Multiple Vitamins-Calcium (MULTI-DAY/CALCIUM/EXTRA IRON PO), Take by mouth., Disp: , Rfl:    olmesartan (BENICAR) 20 MG tablet, Take 1 tablet (20 mg total) by mouth daily., Disp: 90 tablet, Rfl: 2   Olopatadine-Mometasone (RYALTRIS) 665-25 MCG/ACT SUSP, Place 2 sprays into the nose 2 (two) times daily as needed (runny/stuffy nose)., Disp: 29 g, Rfl: 5   rizatriptan (MAXALT-MLT) 10 MG disintegrating tablet, TAKE 1 TABLET 3 TIME A DA Y AS NEEDED FOR MIGRAINE, Disp: 12 tablet, Rfl: 1   verapamil (CALAN-SR) 180 MG CR tablet, Take 2 tablets (360 mg total) by mouth daily., Disp: 180 tablet, Rfl: 2   Vitamin D, Ergocalciferol, (DRISDOL) 1.25 MG (50000 UNIT) CAPS capsule, Take 1 capsule (50,000 Units total) by mouth every 7 (seven) days for 12 doses., Disp: 12 capsule, Rfl: 0  Past Medical History:  Diagnosis Date   Allergy    trees/ grasses/ animals/dust/mold   Biliary dyskinesia    Colloid thyroid nodule    COVID-19 01/2021   Crohn's disease     Stomach, terminal ileum, cecum   Dyspnea    due to nasal /sinus congestion   Gastric ulcer    antral   GERD (gastroesophageal reflux disease)    History of kidney stones    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   History of migraine headaches    HTN (hypertension)    Kidney stone 09/20/2012   Migraine    Obesity    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    Pneumonia 02/27/2014   ED   Postsurgical dumping syndrome 02/14/2019   Sleep apnea    has cpap- does not wear    Past Surgical History:  Procedure Laterality Date   BALLOON DILATION N/A 12/06/2017   Procedure: BALLOON DILATION;  Surgeon: Iva Boop, MD;  Location: [facility];  Service:  Endoscopy;  Laterality: N/A;   BIOPSY  06/21/2018   Procedure: BIOPSY;  Surgeon: Iva Boop, MD;  Location: [facility];  Service: Endoscopy;;   CHOLECYSTECTOMY  2011   [place]   CIRCUMCISION N/A 12/09/2018   Procedure: CIRCUMCISION ADULT;  Surgeon: Crista Elliot, MD;  Location: [facility];  Service: Urology;  Laterality: N/A;   COLONOSCOPY  07/26/11   Crohn's colitis, ileitis   ESOPHAGOGASTRODUODENOSCOPY  05/04/11, 07/26/11   granulomatous gastritis - Crohn's   ESOPHAGOGASTRODUODENOSCOPY (EGD) WITH PROPOFOL N/A 03/28/2017   Procedure: ESOPHAGOGASTRODUODENOSCOPY (EGD) WITH PROPOFOL  with balloon dilation of duodenum;  Surgeon: Benancio Deeds, MD;  Location: [facility];  Service: Gastroenterology;  Laterality: N/A;   ESOPHAGOGASTRODUODENOSCOPY (EGD) WITH PROPOFOL N/A 12/06/2017   Procedure: ESOPHAGOGASTRODUODENOSCOPY (EGD) WITH PROPOFOL;  Surgeon: Iva Boop, MD;  Location: [facility];  Service: Endoscopy;  Laterality: N/A;   ESOPHAGOGASTRODUODENOSCOPY (EGD) WITH PROPOFOL N/A 06/21/2018   Procedure: ESOPHAGOGASTRODUODENOSCOPY (EGD) WITH PROPOFOL;  Surgeon: Iva Boop, MD;  Location: [facility];  Service: Endoscopy;  Laterality: N/A;   ETHMOIDECTOMY N/A 11/27/2018   Procedure: ENDOSCOPIC TOTAL  ETHMOIDECTOMY;  Surgeon: Newman Pies, MD;  Location: [facility];  Service: ENT;  Laterality: N/A;   FLEXIBLE SIGMOIDOSCOPY     GASTROJEJUNOSTOMY  01/2018   MULTIPLE TOOTH EXTRACTIONS  2005   NASAL SEPTOPLASTY W/ TURBINOPLASTY Bilateral 11/27/2018   Procedure: NASAL SEPTOPLASTY WITH BILATERAL TURBINATE REDUCTION;  Surgeon: Newman Pies, MD;  Location: [facility];  Service: ENT;  Laterality: Bilateral;   SHOULDER SURGERY     right   SINUS ENDO WITH FUSION N/A 11/27/2018   Procedure: SINUS ENDO WITH FUSION;  Surgeon: Newman Pies, MD;  Location: [facility];  Service: ENT;  Laterality: N/A;   UPPER GASTROINTESTINAL ENDOSCOPY     VASECTOMY     bilateral w/lysis of penile adhesions   Social History   Social History Narrative   Married. Education: [employer].    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   Review of Systems As per HPI  Objective:   Physical Exam BP 130/88   Pulse 87   Ht 6' (1.829 m)   Wt (!) 361 lb 6 oz (163.9 kg)   SpO2 97%   BMI 49.01 kg/m

## 2022-10-23 NOTE — Telephone Encounter (Signed)
Pt stated that he has not heard from the ENT Dr.  Milinda Pointer reviewed and noted previous documentation.  Encounter Date: 09/11/2022   Signed      Pt made aware of Dr. Leone Payor recommendations: Referral was faxed to [facility] along with pt records. Pt made aware. Pt notified that if he has not heard anything from them within two weeks to give our office a call back: Pt verbalized understanding with all questions answered.           Electronically signed by Emeline Darling, RN at 09/12/2022  9:18 AM   Paper fax was located with conformation page that fax was sent successfully  Left message for [facility] to call back.  Direct number provided.

## 2022-10-25 NOTE — Telephone Encounter (Signed)
Left message for referral coordinator at [facility] to call back to check on the status of the referral.

## 2022-10-27 NOTE — Telephone Encounter (Signed)
Called [facility], and confirmed that referral was received. Front desk stated they have been behind on referrals, but if patient calls their office they will go ahead and make the appointment. Patient is aware & has been provided their number. No further questions.

## 2022-12-11 DIAGNOSIS — R0982 Postnasal drip: Secondary | ICD-10-CM | POA: Diagnosis not present

## 2022-12-11 DIAGNOSIS — R42 Dizziness and giddiness: Secondary | ICD-10-CM | POA: Diagnosis not present

## 2022-12-11 DIAGNOSIS — J3489 Other specified disorders of nose and nasal sinuses: Secondary | ICD-10-CM | POA: Diagnosis not present

## 2022-12-11 DIAGNOSIS — H9313 Tinnitus, bilateral: Secondary | ICD-10-CM | POA: Diagnosis not present

## 2022-12-14 DIAGNOSIS — R42 Dizziness and giddiness: Secondary | ICD-10-CM | POA: Diagnosis not present

## 2023-01-12 ENCOUNTER — Other Ambulatory Visit: Payer: Self-pay

## 2023-01-12 ENCOUNTER — Ambulatory Visit: Payer: Medicare Other | Admitting: Allergy

## 2023-01-12 ENCOUNTER — Encounter: Payer: Self-pay | Admitting: Allergy

## 2023-01-12 VITALS — BP 150/108 | HR 78 | Temp 98.6°F | Ht 72.0 in | Wt 370.0 lb

## 2023-01-12 DIAGNOSIS — J3089 Other allergic rhinitis: Secondary | ICD-10-CM

## 2023-01-12 DIAGNOSIS — I10 Essential (primary) hypertension: Secondary | ICD-10-CM

## 2023-01-12 DIAGNOSIS — J302 Other seasonal allergic rhinitis: Secondary | ICD-10-CM

## 2023-01-12 DIAGNOSIS — H1013 Acute atopic conjunctivitis, bilateral: Secondary | ICD-10-CM | POA: Diagnosis not present

## 2023-01-12 MED ORDER — CETIRIZINE HCL 10 MG PO TABS: 10.0000 mg | ORAL_TABLET | Freq: Two times a day (BID) | ORAL | 5 refills | Status: AC

## 2023-01-12 MED ORDER — MONTELUKAST SODIUM 10 MG PO TABS: 10.0000 mg | ORAL_TABLET | Freq: Every day | ORAL | 1 refills | Status: DC

## 2023-01-12 NOTE — Patient Instructions (Addendum)
Allergic rhinitis with conjunctivitis Continue avoidance measures for grass pollen, weed pollen, molds, dust mites Continue Cetirizine increased to 2 tabs daily.  This is an antihistamine.  Start Singulair 10mg  1 tab daily.  This is an antileukotriene that can help with both allergy and asthma symptom control.  If you notice any change in mood/behavior/sleep after starting Singulair then stop this medication and let us know.  Symptoms resolve after stopping the medication.   Continue your nasal irrigation daily. Call to start allergy shots when you are ready.      Your blood pressure is elevated at today's visit.  Follow-up with your primary care provider for management of your blood pressure   Follow up in 4-6 months or sooner if needed.   Control of Dust Mite Allergen Dust mites play a major role in allergic asthma and rhinitis. They occur in environments with high humidity wherever human skin is found. Dust mites absorb humidity from the atmosphere (ie, they do not drink) and feed on organic matter (including shed human and animal skin). Dust mites are a microscopic type of insect that you cannot see with the naked eye. High levels of dust mites have been detected from mattresses, pillows, carpets, upholstered furniture, bed covers, clothes, soft toys and any woven material. The principal allergen of the dust mite is found in its feces. A gram of dust may contain 1,000 mites and 250,000 fecal particles. Mite antigen is easily measured in the air during house cleaning activities. Dust mites do not bite and do not cause harm to humans, other than by triggering allergies/asthma.  Ways to decrease your exposure to dust mites in your home:  1. Encase mattresses, box springs and pillows with a mite-impermeable barrier or cover  2. Wash sheets, blankets and drapes weekly in hot water (130 F) with detergent and dry them in a dryer on the hot setting.  3. Have the room cleaned frequently with a  vacuum cleaner and a damp dust-mop. For carpeting or rugs, vacuuming with a vacuum cleaner equipped with a high-efficiency particulate air (HEPA) filter. The dust mite allergic individual should not be in a room which is being cleaned and should wait 1 hour after cleaning before going into the room.  4. Do not sleep on upholstered furniture (eg, couches).  5. If possible removing carpeting, upholstered furniture and drapery from the home is ideal. Horizontal blinds should be eliminated in the rooms where the person spends the most time (bedroom, study, television room). Washable vinyl, roller-type shades are optimal.  6. Remove all non-washable stuffed toys from the bedroom. Wash stuffed toys weekly like sheets and blankets above.  7. Reduce indoor humidity to less than 50%. Inexpensive humidity monitors can be purchased at most hardware stores. Do not use a humidifier as can make the problem worse and are not recommended.  Reducing Pollen Exposure The American Academy of Allergy, Asthma and Immunology suggests the following steps to reduce your exposure to pollen during allergy seasons. Do not hang sheets or clothing out to dry; pollen may collect on these items. Do not mow lawns or spend time around freshly cut grass; mowing stirs up pollen. Keep windows closed at night.  Keep car windows closed while driving. Minimize morning activities outdoors, a time when pollen counts are usually at their highest. Stay indoors as much as possible when pollen counts or humidity is high and on windy days when pollen tends to remain in the air longer. Use air conditioning when possible.  Many air conditioners have filters that trap the pollen spores. Use a HEPA room air filter to remove pollen form the indoor air you breathe.  Control of Mold Allergen Mold and fungi can grow on a variety of surfaces provided certain temperature and moisture conditions exist.  Outdoor molds grow on plants, decaying vegetation  and soil.  The major outdoor mold, Alternaria and Cladosporium, are found in very high numbers during hot and dry conditions.  Generally, a late Summer - Fall peak is seen for common outdoor fungal spores.  Rain will temporarily lower outdoor mold spore count, but counts rise rapidly when the rainy period ends.  The most important indoor molds are Aspergillus and Penicillium.  Dark, humid and poorly ventilated basements are ideal sites for mold growth.  The next most common sites of mold growth are the bathroom and the kitchen.  Outdoor [employer] Use air conditioning and keep windows closed Avoid exposure to decaying vegetation. Avoid leaf raking. Avoid grain handling. Consider wearing a face mask if working in moldy areas.  Indoor Mold Control Maintain humidity below 50%. Clean washable surfaces with 5% bleach solution. Remove sources e.g. Contaminated carpets.

## 2023-01-12 NOTE — Progress Notes (Signed)
Follow-up Note  RE: Craig Guzman. MRN: [id number] DOB: [birth year] Date of Office Visit: 01/12/2023   History of present illness: Craig Lampa. is a 52 y.o. male presenting today for follow-up of allergic rhinitis with conjunctivitis.  He has history of hypertension as well.  He was last seen in the office on 09/07/2022 by our nurse practitioner [facility] for skin testing.  He was positive to grass pollen, weed pollen and molds. He states symptoms are essentially the same.  The Ryaltris medication he was not able to use as it made him sick to his stomach.  He states any type of nasal spray that gets in his throat and swallowed causes him to have nausea and then he vomits.  He states they all seem to drain down his throat.  He has tried other nasal sprays with similar results.  He is able to do nasal irrigations however because he states he can close off his mouth where it does not drain and it comes out the other nostril.  He does try to perform this daily.  He is taking cetirizine daily.  This provides some relief but does not provide complete control.  He states with using eyedrops is also difficult.  He cannot do the eyedrop himself.  If he has his wife do it she has to put it in the corner with his eyes closed and then he can blink.  He would like to start allergy shots however he is needing to collect more findings for the portion that he will need to pay a and the insurance will cover the rest.  He states he will call to schedule a [facility] appointment when he is ready.  Review of systems: Review of Systems  Constitutional: Negative.   HENT:  Positive for congestion and postnasal drip.   Eyes:  Positive for itching.  Respiratory: Negative.    Cardiovascular: Negative.   Musculoskeletal: Negative.   Skin: Negative.   Allergic/Immunologic: Negative.   Neurological: Negative.      All other systems negative unless noted above in HPI  Past medical/social/surgical/family history have been  reviewed and are unchanged unless specifically indicated below.  No changes  Medication List: Current Outpatient Medications  Medication Sig Dispense Refill   albuterol (VENTOLIN HFA) 108 (90 Base) MCG/ACT inhaler Inhale 1-2 puffs into the lungs every 6 (six) hours as needed for wheezing or shortness of breath. 18 g 0   Calcium Carb-Cholecalciferol (CALCIUM 1000 + D PO) Take by mouth.     cetirizine (ZYRTEC) 10 MG tablet Take 1 tablet (10 mg total) by mouth 2 (two) times daily. 30 tablet 5   cyanocobalamin (VITAMIN B12) 1000 MCG/ML injection INJECT INTRAMuscularly ONCE FOR A DOSE 3 mL 0   cyclobenzaprine (FLEXERIL) 10 MG tablet Take 10 mg by mouth as needed.     doxazosin (CARDURA) 8 MG tablet Take 1 tablet (8 mg total) by mouth at bedtime. 90 tablet 2   montelukast (SINGULAIR) 10 MG tablet Take 1 tablet (10 mg total) by mouth at bedtime. 30 tablet 1   Multiple Vitamins-Calcium (MULTI-DAY/CALCIUM/EXTRA IRON PO) Take by mouth.     olmesartan (BENICAR) 20 MG tablet Take 1 tablet (20 mg total) by mouth daily. 90 tablet 2   rizatriptan (MAXALT-MLT) 10 MG disintegrating tablet TAKE 1 TABLET 3 TIME A DA Y AS NEEDED FOR MIGRAINE 12 tablet 1   verapamil (CALAN-SR) 180 MG CR tablet Take 2 tablets (360 mg total) by mouth daily. 180 tablet  2   Olopatadine-Mometasone (RYALTRIS) 665-25 MCG/ACT SUSP Place 2 sprays into the nose 2 (two) times daily as needed (runny/stuffy nose). (Patient not taking: Reported on 01/12/2023) 29 g 5   No current facility-administered medications for this visit.     Known medication allergies: Allergies  Allergen Reactions   Zithromax [Azithromycin] Hypertension   Lisinopril Swelling    SWELLING REACTION UNSPECIFIED    Tessalon Perles [Benzonatate]     hypertension     Physical examination: Blood pressure (!) 150/108, pulse 78, temperature 98.6 F (37 C), height 6' (1.829 m), weight (!) 370 lb (167.8 kg), SpO2 99%.  General: Alert, interactive, in no acute  distress. HEENT: PERRLA, TMs pearly gray, turbinates moderately edematous with crusty discharge, post-pharynx non erythematous. Neck: Supple without lymphadenopathy. Lungs: Clear to auscultation without wheezing, rhonchi or rales. {no increased work of breathing. CV: Normal S1, S2 without murmurs. Abdomen: Nondistended, nontender. Skin: Warm and dry, without lesions or rashes. Extremities:  No clubbing, cyanosis or edema. Neuro:   Grossly intact.  Diagnositics/Labs: None today  Assessment and plan:   Allergic rhinitis with conjunctivitis Continue avoidance measures for grass pollen, weed pollen, molds, dust mites Since we are a bit limited to using essentially oral medications we will maximize this at this time Continue Cetirizine increased to 2 tabs daily.  This is an antihistamine.  Start Singulair 10mg  1 tab daily.  This is an antileukotriene that can help with both allergy and asthma symptom control.  If you notice any change in mood/behavior/sleep after starting Singulair then stop this medication and let us know.  Symptoms resolve after stopping the medication.   Continue your nasal irrigation daily. Call to start allergy shots when you are ready.      Your blood pressure is elevated at today's visit.  Follow-up with your primary care provider for management of your blood pressure   Follow up in 4-6 months or sooner if needed.  I appreciate the opportunity to take part in Craig Guzman's care. Please do not hesitate to contact me with questions.  Sincerely,   Margo Aye, MD Allergy/Immunology [facility]

## 2023-01-25 ENCOUNTER — Other Ambulatory Visit: Payer: Self-pay | Admitting: Allergy

## 2023-02-05 ENCOUNTER — Other Ambulatory Visit: Payer: Self-pay | Admitting: Family Medicine

## 2023-02-05 DIAGNOSIS — R351 Nocturia: Secondary | ICD-10-CM

## 2023-02-05 DIAGNOSIS — I10 Essential (primary) hypertension: Secondary | ICD-10-CM

## 2023-02-13 ENCOUNTER — Telehealth: Payer: Self-pay | Admitting: Internal Medicine

## 2023-02-13 NOTE — Telephone Encounter (Signed)
I spoke with both Mr and Mrs Craig Guzman. His zofran is expired that he has. He is having some Nausea that OTC products haven't helped. His wife said could we send in both zofran 4mg  and 8 mg ODT since they are not sure which one the insurance will cover. I confirmed the pharmacy as [facility] They are aware Dr Leone Payor is out today and it may be tomorrow before this is addressed.

## 2023-02-13 NOTE — Telephone Encounter (Signed)
Inbound call from patient requesting a call back to discuss if he is able to receive a refill for Zofran. Patient stated he has not taken medication in over a year. Please advise, thank you.

## 2023-02-14 MED ORDER — ONDANSETRON 8 MG PO TBDP: 8.0000 mg | ORAL_TABLET | Freq: Three times a day (TID) | ORAL | 5 refills | Status: AC | PRN

## 2023-02-14 NOTE — Telephone Encounter (Signed)
I sent in 8 mg - it looks like it is covered  If problems let us know

## 2023-02-14 NOTE — Telephone Encounter (Signed)
Patient informed and said thank you. 

## 2023-02-16 ENCOUNTER — Other Ambulatory Visit: Payer: Self-pay | Admitting: Allergy

## 2023-04-09 ENCOUNTER — Ambulatory Visit (INDEPENDENT_AMBULATORY_CARE_PROVIDER_SITE_OTHER): Payer: Medicare Other | Admitting: Family Medicine

## 2023-04-09 VITALS — BP 134/74 | HR 91 | Temp 97.9°F | Ht 72.0 in | Wt 367.4 lb

## 2023-04-09 DIAGNOSIS — J069 Acute upper respiratory infection, unspecified: Secondary | ICD-10-CM

## 2023-04-09 DIAGNOSIS — R051 Acute cough: Secondary | ICD-10-CM

## 2023-04-09 LAB — POCT INFLUENZA A/B
Influenza A, POC: NEGATIVE
Influenza B, POC: NEGATIVE

## 2023-04-09 LAB — POC COVID19 BINAXNOW: SARS Coronavirus 2 Ag: NEGATIVE

## 2023-04-09 MED ORDER — HYDROCODONE BIT-HOMATROP MBR 5-1.5 MG/5ML PO SOLN: ORAL | 0 refills | Status: DC

## 2023-04-09 NOTE — Progress Notes (Signed)
Subjective:  Patient ID: Craig Living., male    DOB: [birth year]  Age: 52 y.o. MRN: [id number]  CC:  Chief Complaint  Patient presents with   Cough    Notes cough, sneeze, congestion, nausea, no vomiting , notes started Thursday, diarrhea, notes body aches and chills, no known fever, facial pain     HPI Craig Living. presents for   Acute visit for cough, congestion as above. Started 4 days ago with body ache, diarrhea, cough, congestion,chills but no known fever.  Sneezing and congestion with some nausea but no vomiting. Using zofran.  Dyspena with cough or prolonged walking. Some fatigue Cough keeping up at night.   sick contacts - 8th grade dtr - similar sx's, spouse - similar sx's. Took cefdinir for sinus issues and cough. No covid.  Last COVID infection in 2022.  No recent booster, has not yet had flu vaccine this year.  Attempted treatments: Zofran, coricidin HBP, tylenol. Cough keeping up at night. No relief with tessalon perles.     History Patient Active Problem List   Diagnosis Date Noted   Seasonal and perennial allergic rhinitis 09/07/2022   Allergic conjunctivitis of both eyes 09/07/2022   Feeding problems 12/02/2021   Abdominal migraine, not intractable ? 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   History of migraine headaches    HTN (hypertension)    Kidney stone 09/20/2012   Migraine    Obesity    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    Pneumonia 02/27/2014   ED   Postsurgical dumping syndrome 02/14/2019   Sleep apnea    has cpap- does not wear    Past Surgical History:  Procedure Laterality Date   BALLOON DILATION N/A 12/06/2017   Procedure: BALLOON DILATION;  Surgeon: Iva Boop, MD;  Location: [facility];  Service: Endoscopy;  Laterality: N/A;   BIOPSY  06/21/2018   Procedure: BIOPSY;  Surgeon: Iva Boop, MD;  Location: [facility];  Service: Endoscopy;;   CHOLECYSTECTOMY  2011   [facility]   CIRCUMCISION N/A 12/09/2018   Procedure: CIRCUMCISION ADULT;  Surgeon: Crista Elliot, MD;  Location: [facility];  Service: Urology;  Laterality: N/A;   COLONOSCOPY  07/26/11   Crohn's colitis, ileitis   ESOPHAGOGASTRODUODENOSCOPY  05/04/11, 07/26/11   granulomatous  gastritis - Crohn's   ESOPHAGOGASTRODUODENOSCOPY (EGD) WITH PROPOFOL N/A 03/28/2017   Procedure: ESOPHAGOGASTRODUODENOSCOPY (EGD) WITH PROPOFOL  with balloon dilation of duodenum;  Surgeon: Benancio Deeds, MD;  Location: [facility];  Service: Gastroenterology;  Laterality: N/A;   ESOPHAGOGASTRODUODENOSCOPY (EGD) WITH PROPOFOL N/A 12/06/2017   Procedure: ESOPHAGOGASTRODUODENOSCOPY (EGD) WITH PROPOFOL;  Surgeon: Iva Boop, MD;  Location: [facility];  Service: Endoscopy;   Laterality: N/A;   ESOPHAGOGASTRODUODENOSCOPY (EGD) WITH PROPOFOL N/A 06/21/2018   Procedure: ESOPHAGOGASTRODUODENOSCOPY (EGD) WITH PROPOFOL;  Surgeon: Iva Boop, MD;  Location: [facility];  Service: Endoscopy;  Laterality: N/A;   ETHMOIDECTOMY N/A 11/27/2018   Procedure: ENDOSCOPIC TOTAL ETHMOIDECTOMY;  Surgeon: Newman Pies, MD;  Location: [facility];  Service: ENT;  Laterality: N/A;   FLEXIBLE SIGMOIDOSCOPY     GASTROJEJUNOSTOMY  01/2018   MULTIPLE TOOTH EXTRACTIONS  2005   NASAL SEPTOPLASTY W/ TURBINOPLASTY Bilateral 11/27/2018   Procedure: NASAL SEPTOPLASTY WITH BILATERAL TURBINATE REDUCTION;  Surgeon: Newman Pies, MD;  Location: [facility];  Service: ENT;  Laterality: Bilateral;   SHOULDER SURGERY     right   SINUS ENDO WITH FUSION N/A 11/27/2018   Procedure: SINUS ENDO WITH FUSION;  Surgeon: Newman Pies, MD;  Location: [facility];  Service: ENT;  Laterality: N/A;   UPPER GASTROINTESTINAL ENDOSCOPY     VASECTOMY     bilateral w/lysis of penile adhesions   Allergies  Allergen Reactions   Zithromax [Azithromycin] Hypertension   Lisinopril Swelling    SWELLING REACTION UNSPECIFIED    Tessalon Perles [Benzonatate]     hypertension   Prior to Admission medications   Medication Sig Start Date End Date Taking? Authorizing Provider  albuterol (VENTOLIN HFA) 108 (90 Base) MCG/ACT inhaler Inhale 1-2 puffs into the lungs every 6 (six) hours as needed for wheezing or shortness of breath. 08/04/22  Yes Rinaldo Ratel, [place] N, FNP  Calcium Carb-Cholecalciferol (CALCIUM 1000 + D PO) Take by mouth.   Yes [provider]  cetirizine (ZYRTEC) 10 MG tablet Take 1 tablet (10 mg total) by mouth 2 (two) times daily. 01/12/23  Yes Padgett, Pilar Grammes, MD  cyanocobalamin (VITAMIN B12) 1000 MCG/ML injection INJECT INTRAMuscularly ONCE FOR A DOSE 10/20/22  Yes Iva Boop, MD  cyclobenzaprine (FLEXERIL) 10 MG tablet Take 10 mg by mouth as needed. 07/11/13  Yes [provider]  doxazosin (CARDURA) 8  MG tablet TAKE ONE TABLET BY MOUTH AT BEDTIME 02/05/23  Yes [name], MD  montelukast (SINGULAIR) 10 MG tablet TAKE 1 TABLET BY MOUTH EVERYDAY AT BEDTIME 02/16/23  Yes Padgett, Pilar Grammes, MD  Multiple Vitamins-Calcium (MULTI-DAY/CALCIUM/EXTRA IRON PO) Take by mouth.   Yes [provider]  olmesartan (BENICAR) 20 MG tablet Take 1 tablet (20 mg total) by mouth daily. 10/02/22  Yes [name], MD  ondansetron (ZOFRAN-ODT) 8 MG disintegrating tablet Take 1 tablet (8 mg total) by mouth every 8 (eight) hours as needed for nausea or vomiting. 02/14/23  Yes Iva Boop, MD  rizatriptan (MAXALT-MLT) 10 MG disintegrating tablet TAKE 1 TABLET 3 TIME A DA Y AS NEEDED FOR MIGRAINE 01/26/20  Yes Iva Boop, MD  verapamil (CALAN-SR) 180 MG CR tablet Take 2 tablets (360 mg total) by mouth daily. 10/02/22  Yes [name], MD  Olopatadine-Mometasone Cristal [facility]ous) [phone] MCG/ACT SUSP Place 2 sprays into the nose 2 (two) times daily as needed (runny/stuffy nose). Patient not taking: Reported on 01/12/2023 09/01/22   Marcelyn Bruins, MD   Social History  Socioeconomic History   Marital status: Married    Spouse name: Not on file   Number of children: 3   Years of education: Some college   Highest education level: Associate degree: occupational, Scientist, product/process development, or vocational program  Occupational History   Occupation: Set designer: [employer]  Tobacco Use   Smoking status: Former    Current packs/day: 0.00    Average packs/day: 0.8 packs/day for 21.0 years (15.8 ttl pk-yrs)    Types: Cigarettes    Start date: 12/01/1996    Quit date: 12/01/2017    Years since quitting: 5.3    Passive exposure: Current (once in a while)   Smokeless tobacco: Former    Types: Snuff   Tobacco comments:    Counseling sheet given in exam room   Vaping Use   Vaping status: Never Used  Substance and Sexual Activity   Alcohol use: Not Currently   Drug use: No   Sexual activity: Yes     Partners: Female  Other Topics Concern   Not on file  Social History Narrative   Married. Education: [employer].    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    Right-handed   Caffeine: 3 sodas per day   Occasional alcohol no drugs   Former smoker former snuff user   Social Determinants of Corporate investment banker Strain: Low Risk  (04/09/2023)   Overall Financial Resource Strain (CARDIA)    Difficulty of Paying Living Expenses: Not hard at all  Food Insecurity: No Food Insecurity (04/09/2023)   Hunger Vital Sign    Worried About Running Out of Food in the Last Year: Never true    Ran Out of Food in the Last Year: Never true  Transportation Needs: No Transportation Needs (04/09/2023)   PRAPARE - Administrator, Civil Service (Medical): No    Lack of Transportation (Non-Medical): No  Physical Activity: Insufficiently Active (04/09/2023)   Exercise Vital Sign    Days of Exercise per Week: 2 days    Minutes of Exercise per Session: 10 min  Stress: No Stress Concern Present (04/09/2023)   [employer] of Occupational Health - Occupational Stress Questionnaire    Feeling of Stress : Not at all  Social Connections: Moderately Integrated (04/09/2023)   Social Connection and Isolation Panel [NHANES]    Frequency of Communication with Friends and Family: More than three times a week    Frequency of Social Gatherings with Friends and Family: Once a week    Attends Religious Services: 1 to 4 times per year    Active Member of [employer] or Organizations: No    Attends Banker Meetings: Never    Marital Status: Married  Catering manager Violence: Not At Risk (08/16/2022)   Humiliation, Afraid, Rape, and Kick questionnaire    Fear of Current or Ex-Partner: No    Emotionally Abused: No    Physically Abused: No    Sexually Abused: No    Review of Systems Per HPI.   Objective:   Vitals:   04/09/23 1102  BP: 134/74  Pulse: 91   Temp: 97.9 F (36.6 C)  TempSrc: Temporal  SpO2: 100%  Weight: (!) 367 lb 6.4 oz (166.7 kg)  Height: 6' (1.829 m)    Physical Exam Vitals reviewed.  Constitutional:      Appearance: He is well-developed.  HENT:     Head: Normocephalic and atraumatic.  Right Ear: Tympanic membrane, ear canal and external ear normal.     Left Ear: Tympanic membrane, ear canal and external ear normal.     Nose: No rhinorrhea.     Mouth/Throat:     Pharynx: No oropharyngeal exudate or posterior oropharyngeal erythema.  Eyes:     Conjunctiva/sclera: Conjunctivae normal.     Pupils: Pupils are equal, round, and reactive to light.  Cardiovascular:     Rate and Rhythm: Normal rate and regular rhythm.     Heart sounds: Normal heart sounds. No murmur heard. Pulmonary:     Effort: Pulmonary effort is normal.     Breath sounds: Normal breath sounds. No wheezing, rhonchi or rales.  Abdominal:     Palpations: Abdomen is soft.     Tenderness: There is no abdominal tenderness.  Musculoskeletal:     Cervical back: Neck supple.  Lymphadenopathy:     Cervical: No cervical adenopathy.  Skin:    General: Skin is warm and dry.     Findings: No rash.  Neurological:     Mental Status: He is alert and oriented to person, place, and time.  Psychiatric:        Behavior: Behavior normal.      Results for orders placed or performed in visit on 04/09/23  POC COVID-19  Result Value Ref Range   SARS Coronavirus 2 Ag Negative Negative  POCT Influenza A/B  Result Value Ref Range   Influenza A, POC Negative Negative   Influenza B, POC Negative Negative     Assessment & Plan:  Craig Archie. is a 52 y.o. male . Acute cough - Plan: POC COVID-19, POCT Influenza A/B, HYDROcodone bit-homatropine (HYCODAN) 5-1.5 MG/5ML syrup  Upper respiratory tract infection, unspecified type - Plan: HYDROcodone bit-homatropine (HYCODAN) 5-1.5 MG/5ML syrup  Suspected viral illness with sick contacts with same.   Reassuring vital signs and exam, negative COVID and flu testing.  Symptomatic care discussed with Mucinex or Mucinex DM during the day, fluids, rest and hydrocodone cough syrup provided at night.  He has taken this previously without apparent issues.  Side effects of meds discussed, RTC precautions given. Meds ordered this encounter  Medications   HYDROcodone bit-homatropine (HYCODAN) 5-1.5 MG/5ML syrup    Sig: 34m by mouth a bedtime as needed for cough.    Dispense:  120 mL    Refill:  0   Patient Instructions  Sorry you are sick. Covid and flu tests were both negative or normal.  I suspect you have picked up a different virus.  Mucinex or Mucinex DM for cough, I will prescribe a cough medicine to use at night if needed.  Make sure to drink plenty of fluids and rest.  I expect you to be improving as the week goes on but if any worsening symptoms or cough is not improving next week, return for recheck. Hang in there! Upper Respiratory Infection, Adult An upper respiratory infection (URI) is a common viral infection of the nose, throat, and upper air passages that lead to the lungs. The most common type of URI is the common cold. URIs usually get better on their own, without medical treatment. What are the causes? A URI is caused by a virus. You may catch a virus by: Breathing in droplets from an infected person's cough or sneeze. Touching something that has been exposed to the virus (is contaminated) and then touching your mouth, nose, or eyes. What increases the risk? You are more likely to get a  URI if: You are very young or very old. You have close contact with others, such as at work, school, or a health care facility. You smoke. You have long-term (chronic) heart or lung disease. You have a weakened disease-fighting system (immune system). You have nasal allergies or asthma. You are experiencing a lot of stress. You have poor nutrition. What are the signs or symptoms? A URI usually  involves some of the following symptoms: Runny or stuffy (congested) nose. Cough. Sneezing. Sore throat. Headache. Fatigue. Fever. Loss of appetite. Pain in your forehead, behind your eyes, and over your cheekbones (sinus pain). Muscle aches. Redness or irritation of the eyes. Pressure in the ears or face. How is this diagnosed? This condition may be diagnosed based on your medical history and symptoms, and a physical exam. Your health care provider may use a swab to take a mucus sample from your nose (nasal swab). This sample can be tested to determine what virus is causing the illness. How is this treated? URIs usually get better on their own within 7-10 days. Medicines cannot cure URIs, but your health care provider may recommend certain medicines to help relieve symptoms, such as: Over-the-counter cold medicines. Cough suppressants. Coughing is a type of defense against infection that helps to clear the respiratory system, so take these medicines only as recommended by your health care provider. Fever-reducing medicines. Follow these instructions at home: Activity Rest as needed. If you have a fever, stay home from work or school until your fever is gone or until your health care provider says your URI cannot spread to other people (is no longer contagious). Your health care provider may have you wear a face mask to prevent your infection from spreading. Relieving symptoms Gargle with a mixture of salt and water 3-4 times a day or as needed. To make salt water, completely dissolve -1 tsp (3-6 g) of salt in 1 cup (237 mL) of warm water. Use a cool-mist humidifier to add moisture to the air. This can help you breathe more easily. Eating and drinking  Drink enough fluid to keep your urine pale yellow. Eat soups and other clear broths. General instructions  Take over-the-counter and prescription medicines only as told by your health care provider. These include cold medicines, fever  reducers, and cough suppressants. Do not use any products that contain nicotine or tobacco. These products include cigarettes, chewing tobacco, and vaping devices, such as e-cigarettes. If you need help quitting, ask your health care provider. Stay away from secondhand smoke. Stay up to date on all immunizations, including the yearly (annual) flu vaccine. Keep all follow-up visits. This is important. How to prevent the spread of infection to others URIs can be contagious. To prevent the infection from spreading: Wash your hands with soap and water for at least 20 seconds. If soap and water are not available, use hand sanitizer. Avoid touching your mouth, face, eyes, or nose. Cough or sneeze into a tissue or your sleeve or elbow instead of into your hand or into the air.  Contact a health care provider if: You are getting worse instead of better. You have a fever or chills. Your mucus is brown or red. You have yellow or brown discharge coming from your nose. You have pain in your face, especially when you bend forward. You have swollen neck glands. You have pain while swallowing. You have white areas in the back of your throat. Get help right away if: You have shortness of breath that gets  worse. You have severe or persistent: Headache. Ear pain. Sinus pain. Chest pain. You have chronic lung disease along with any of the following: Making high-pitched whistling sounds when you breathe, most often when you breathe out (wheezing). Prolonged cough (more than 14 days). Coughing up blood. A change in your usual mucus. You have a stiff neck. You have changes in your: Vision. Hearing. Thinking. Mood. These symptoms may be an emergency. Get help right away. Call 911. Do not wait to see if the symptoms will go away. Do not drive yourself to the hospital. Summary An upper respiratory infection (URI) is a common infection of the nose, throat, and upper air passages that lead to the  lungs. A URI is caused by a virus. URIs usually get better on their own within 7-10 days. Medicines cannot cure URIs, but your health care provider may recommend certain medicines to help relieve symptoms. This information is not intended to replace advice given to you by your health care provider. Make sure you discuss any questions you have with your health care provider. Document Revised: 01/19/2021 Document Reviewed: 01/19/2021 Elsevier Patient Education  2024 Elsevier Inc.     Signed,   Meredith Staggers, MD [facility], [facility] 04/09/23 12:57 PM

## 2023-04-09 NOTE — Patient Instructions (Addendum)
Sorry you are sick. Covid and flu tests were both negative or normal.  I suspect you have picked up a different virus.  Mucinex or Mucinex DM for cough, I will prescribe a cough medicine to use at night if needed.  Make sure to drink plenty of fluids and rest.  I expect you to be improving as the week goes on but if any worsening symptoms or cough is not improving next week, return for recheck. Hang in there! Upper Respiratory Infection, Adult An upper respiratory infection (URI) is a common viral infection of the nose, throat, and upper air passages that lead to the lungs. The most common type of URI is the common cold. URIs usually get better on their own, without medical treatment. What are the causes? A URI is caused by a virus. You may catch a virus by: Breathing in droplets from an infected person's cough or sneeze. Touching something that has been exposed to the virus (is contaminated) and then touching your mouth, nose, or eyes. What increases the risk? You are more likely to get a URI if: You are very young or very old. You have close contact with others, such as at work, school, or a health care facility. You smoke. You have long-term (chronic) heart or lung disease. You have a weakened disease-fighting system (immune system). You have nasal allergies or asthma. You are experiencing a lot of stress. You have poor nutrition. What are the signs or symptoms? A URI usually involves some of the following symptoms: Runny or stuffy (congested) nose. Cough. Sneezing. Sore throat. Headache. Fatigue. Fever. Loss of appetite. Pain in your forehead, behind your eyes, and over your cheekbones (sinus pain). Muscle aches. Redness or irritation of the eyes. Pressure in the ears or face. How is this diagnosed? This condition may be diagnosed based on your medical history and symptoms, and a physical exam. Your health care provider may use a swab to take a mucus sample from your nose (nasal  swab). This sample can be tested to determine what virus is causing the illness. How is this treated? URIs usually get better on their own within 7-10 days. Medicines cannot cure URIs, but your health care provider may recommend certain medicines to help relieve symptoms, such as: Over-the-counter cold medicines. Cough suppressants. Coughing is a type of defense against infection that helps to clear the respiratory system, so take these medicines only as recommended by your health care provider. Fever-reducing medicines. Follow these instructions at home: Activity Rest as needed. If you have a fever, stay home from work or school until your fever is gone or until your health care provider says your URI cannot spread to other people (is no longer contagious). Your health care provider may have you wear a face mask to prevent your infection from spreading. Relieving symptoms Gargle with a mixture of salt and water 3-4 times a day or as needed. To make salt water, completely dissolve -1 tsp (3-6 g) of salt in 1 cup (237 mL) of warm water. Use a cool-mist humidifier to add moisture to the air. This can help you breathe more easily. Eating and drinking  Drink enough fluid to keep your urine pale yellow. Eat soups and other clear broths. General instructions  Take over-the-counter and prescription medicines only as told by your health care provider. These include cold medicines, fever reducers, and cough suppressants. Do not use any products that contain nicotine or tobacco. These products include cigarettes, chewing tobacco, and vaping devices, such  as e-cigarettes. If you need help quitting, ask your health care provider. Stay away from secondhand smoke. Stay up to date on all immunizations, including the yearly (annual) flu vaccine. Keep all follow-up visits. This is important. How to prevent the spread of infection to others URIs can be contagious. To prevent the infection from  spreading: Wash your hands with soap and water for at least 20 seconds. If soap and water are not available, use hand sanitizer. Avoid touching your mouth, face, eyes, or nose. Cough or sneeze into a tissue or your sleeve or elbow instead of into your hand or into the air.  Contact a health care provider if: You are getting worse instead of better. You have a fever or chills. Your mucus is brown or red. You have yellow or brown discharge coming from your nose. You have pain in your face, especially when you bend forward. You have swollen neck glands. You have pain while swallowing. You have white areas in the back of your throat. Get help right away if: You have shortness of breath that gets worse. You have severe or persistent: Headache. Ear pain. Sinus pain. Chest pain. You have chronic lung disease along with any of the following: Making high-pitched whistling sounds when you breathe, most often when you breathe out (wheezing). Prolonged cough (more than 14 days). Coughing up blood. A change in your usual mucus. You have a stiff neck. You have changes in your: Vision. Hearing. Thinking. Mood. These symptoms may be an emergency. Get help right away. Call 911. Do not wait to see if the symptoms will go away. Do not drive yourself to the hospital. Summary An upper respiratory infection (URI) is a common infection of the nose, throat, and upper air passages that lead to the lungs. A URI is caused by a virus. URIs usually get better on their own within 7-10 days. Medicines cannot cure URIs, but your health care provider may recommend certain medicines to help relieve symptoms. This information is not intended to replace advice given to you by your health care provider. Make sure you discuss any questions you have with your health care provider. Document Revised: 01/19/2021 Document Reviewed: 01/19/2021 Elsevier Patient Education  2024 [employer].

## 2023-04-25 ENCOUNTER — Ambulatory Visit (INDEPENDENT_AMBULATORY_CARE_PROVIDER_SITE_OTHER): Payer: Medicare Other | Admitting: Family Medicine

## 2023-04-25 ENCOUNTER — Encounter: Payer: Self-pay | Admitting: Family Medicine

## 2023-04-25 VITALS — BP 138/80 | HR 89 | Temp 98.3°F | Ht 72.0 in | Wt 366.8 lb

## 2023-04-25 DIAGNOSIS — M549 Dorsalgia, unspecified: Secondary | ICD-10-CM | POA: Diagnosis not present

## 2023-04-25 DIAGNOSIS — M545 Low back pain, unspecified: Secondary | ICD-10-CM | POA: Diagnosis not present

## 2023-04-25 DIAGNOSIS — M25551 Pain in right hip: Secondary | ICD-10-CM | POA: Diagnosis not present

## 2023-04-25 DIAGNOSIS — Z23 Encounter for immunization: Secondary | ICD-10-CM

## 2023-04-25 NOTE — Patient Instructions (Signed)
Back pain, hip pain could be related to arthritis.  X-rays at the [facility] location below, and once I review those results can discuss next steps.  See information below on chronic back pain.  Physical therapy may be helpful but again can discuss once I see x-ray results and then can decide if PT or orthopedic evaluation recommended.  You should be able to pull up your x-ray results for your chiropractor as well once those reports are available.  Return to the clinic or go to the nearest emergency room if any of your symptoms worsen or new symptoms occur.  Chronic Back Pain Chronic back pain is back pain that lasts longer than 3 months. The cause of your back pain may not be known. Some common causes include: Wear and tear (degenerative disease) of the bones, disks, or tissues that connect bones to each other (ligaments) in your back. Inflammation and stiffness in your back (arthritis). If you have chronic back pain, you may have times when the pain is more intense (flare-ups). You can also learn to manage the pain with home care. Follow these instructions at home: Watch for any changes in your symptoms. Take these actions to help with your pain: Managing pain and stiffness     If told, put ice on the painful area. You may be told to apply ice for the first 24-48 hours after a flare-up starts. Put ice in a plastic bag. Place a towel between your skin and the bag. Leave the ice on for 20 minutes, 2-3 times per day. If told, apply heat to the affected area as often as told by your health care provider. Use the heat source that your provider recommends, such as a moist heat pack or a heating pad. Place a towel between your skin and the heat source. Leave the heat on for 20-30 minutes. If your skin turns bright red, remove the ice or heat right away to prevent skin damage. The risk of damage is higher if you cannot feel pain, heat, or cold. Try soaking in a warm tub. Activity        Avoid  bending and other activities that make the pain worse. Have good posture when you stand or sit. When you stand, keep your upper back and neck straight, with your shoulders pulled back. Avoid slouching. When you sit, keep your back straight. Relax your shoulders. Do not round your shoulders or pull them backward. Do not sit or stand in one place for too long. Take brief periods of rest during the day. This will reduce your pain. Resting in a lying or standing position is often better than sitting to rest. When you rest for longer periods, mix in some mild activity or stretching between periods of rest. This will help to prevent stiffness and pain. Get regular exercise. Ask your provider what activities are safe for you. You may have to avoid lifting. Ask your provider how much you can safely lift. If you do lift, always use the right technique. This means you should: Bend your knees. Keep the load close to your body. Avoid twisting. Medicines Take over-the-counter and prescription medicines only as told by your provider. You may need to take medicines for pain and inflammation. These may be taken by mouth or put on the skin. You may also be given muscle relaxants. Ask your provider if the medicine prescribed to you: Requires you to avoid driving or using machinery. Can cause constipation. You may need to take these actions  to prevent or treat constipation: Drink enough fluid to keep your pee (urine) pale yellow. Take over-the-counter or prescription medicines. Eat foods that are high in fiber, such as beans, whole grains, and fresh fruits and vegetables. Limit foods that are high in fat and processed sugars, such as fried or sweet foods. General instructions  Sleep on a firm mattress in a comfortable position. Try lying on your side with your knees slightly bent. If you lie on your back, put a pillow under your knees. Do not use any products that contain nicotine or tobacco. These products  include cigarettes, chewing tobacco, and vaping devices, such as e-cigarettes. If you need help quitting, ask your provider. Contact a health care provider if: You have pain that does not get better with rest or medicine. You have new pain. You have a fever. You lose weight quickly. You have trouble doing your normal activities. You feel weak or numb in one or both of your legs or feet. Get help right away if: You are not able to control when you pee or poop. You have severe back pain and: Nausea or vomiting. Pain in your chest or abdomen. Shortness of breath. You faint. These symptoms may be an emergency. Get help right away. Call 911. Do not wait to see if the symptoms will go away. Do not drive yourself to the hospital. This information is not intended to replace advice given to you by your health care provider. Make sure you discuss any questions you have with your health care provider. Document Revised: 02/06/2022 Document Reviewed: 02/06/2022 [employer] Patient Education  2024 [employer]   Hip Pain The hip is the joint between the upper legs and the lower pelvis. The bones, cartilage, tendons, and muscles of your hip joint support your body and allow you to move around. Hip pain can range from a minor ache to severe pain in one or both of your hips. The pain may be felt on the inside of the hip joint near the groin, or on the outside near the buttocks and upper thigh. You may also have swelling or stiffness in your hip area. Follow these instructions at home: Managing pain, stiffness, and swelling     If told, put ice on the painful area. Put ice in a plastic bag. Place a towel between your skin and the bag. Leave the ice on for 20 minutes, 2-3 times a day. If told, apply heat to the affected area as often as told by your health care provider. Use the heat source that your provider recommends, such as a moist heat pack or a heating pad. Place a towel between your skin and  the heat source. Leave the heat on for 20-30 minutes. If your skin turns bright red, remove the ice or heat right away to prevent skin damage. The risk of damage is higher if you cannot feel pain, heat, or cold. Activity Do exercises as told by your provider. Avoid activities that cause pain. General instructions  Take over-the-counter and prescription medicines only as told by your provider. Keep a journal of your symptoms. Write down: How often you have hip pain. The location of your pain. What the pain feels like. What makes the pain worse. Sleep with a pillow between your legs on your most comfortable side. Keep all follow-up visits. Your provider will monitor your pain and activity. Contact a health care provider if: You cannot put weight on your leg. Your pain or swelling gets worse after a  week. It gets harder to walk. You have a fever. Get help right away if: You fall. You have a sudden increase in pain and swelling in your hip. Your hip is red or swollen or very tender to touch. This information is not intended to replace advice given to you by your health care provider. Make sure you discuss any questions you have with your health care provider. Document Revised: 02/21/2022 Document Reviewed: 02/21/2022 [employer] Patient Education  2024 [employer].

## 2023-04-25 NOTE — Progress Notes (Signed)
Subjective:  Patient ID: Craig Living., male    DOB: 1970-09-23  Age: 52 y.o. MRN: [id number]  CC:  Chief Complaint  Patient presents with   Back Pain    Pt has been seeing chiropractor for hip and back pain and requested xRays of these areas so they can better treat    HPI Craig Guzman. presents for   Hip/back pain: R hip pain on and off past year. Low back and mid back at times. No preceding fall/injury known. Fall on stairs 4-5 months ago - no back pain at that time.  Trying to exercise, but some soreness in back, R hip after walking constantly for about 15 min. No prior eval.  No radicular leg symptoms.  No bowel or bladder incontinence, no saddle anesthesia, no lower extremity weakness.  No fever, unexplained weight loss or night sweats. Sleeping ok - back not keeping awake.   History of prolonged steroid use with GI issues and intermittent use with bronchitis.   Tx: went to chiropractor yesterday, told was tilted. Recommended xray through PCP for cost reasons. No tx or xr performed. [facility].    History Patient Active Problem List   Diagnosis Date Noted   Seasonal and perennial allergic rhinitis 09/07/2022   Allergic conjunctivitis of both eyes 09/07/2022   Feeding problems 12/02/2021   Abdominal migraine, not intractable ? 03/21/2019   Postsurgical dumping syndrome 02/14/2019   S/P functional endoscopic sinus surgery 11/27/2018   Abdominal pain, epigastric    Angioedema 04/19/2018   Intractable vomiting with nausea    Cigarette nicotine dependence without complication 05/15/2016   Shifting sleep-work schedule, affecting sleep 05/15/2016   Sleep deprivation 05/15/2016   Sleep related hypoventilation in conditions classified elsewhere 05/15/2016   Sleep related headaches 05/15/2016   Morbid obesity (HCC) 09/19/2013   Diarrhea 07/10/2013   Colloid thyroid nodule 04/04/2013   Elevated blood pressure reading in office with diagnosis of hypertension  11/26/2012   Gastroparesis??? 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   History of migraine headaches    HTN (hypertension)    Kidney stone 09/20/2012   Migraine    Obesity    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    Pneumonia 02/27/2014   ED   Postsurgical dumping syndrome 02/14/2019   Sleep apnea    has cpap- does not wear    Past Surgical History:  Procedure Laterality Date   BALLOON DILATION N/A 12/06/2017   Procedure: BALLOON DILATION;  Surgeon: Iva Boop, MD;  Location: [facility];  Service: Endoscopy;  Laterality: N/A;   BIOPSY  06/21/2018   Procedure: BIOPSY;  Surgeon: Iva Boop, MD;  Location: [facility];  Service: Endoscopy;;   CHOLECYSTECTOMY  2011   [place]   CIRCUMCISION N/A 12/09/2018   Procedure: CIRCUMCISION ADULT;  Surgeon: Crista Elliot, MD;  Location: [facility];  Service: Urology;  Laterality:  N/A;   COLONOSCOPY  07/26/11   Crohn's colitis, ileitis   ESOPHAGOGASTRODUODENOSCOPY  05/04/11, 07/26/11   granulomatous gastritis - Crohn's   ESOPHAGOGASTRODUODENOSCOPY (EGD) WITH PROPOFOL N/A 03/28/2017   Procedure: ESOPHAGOGASTRODUODENOSCOPY (EGD) WITH PROPOFOL  with balloon dilation of duodenum;  Surgeon: Benancio Deeds, MD;  Location: [facility];  Service: Gastroenterology;  Laterality: N/A;   ESOPHAGOGASTRODUODENOSCOPY (EGD) WITH PROPOFOL N/A 12/06/2017   Procedure: ESOPHAGOGASTRODUODENOSCOPY (EGD) WITH PROPOFOL;   Surgeon: Iva Boop, MD;  Location: [facility];  Service: Endoscopy;  Laterality: N/A;   ESOPHAGOGASTRODUODENOSCOPY (EGD) WITH PROPOFOL N/A 06/21/2018   Procedure: ESOPHAGOGASTRODUODENOSCOPY (EGD) WITH PROPOFOL;  Surgeon: Iva Boop, MD;  Location: [facility];  Service: Endoscopy;  Laterality: N/A;   ETHMOIDECTOMY N/A 11/27/2018   Procedure: ENDOSCOPIC TOTAL ETHMOIDECTOMY;  Surgeon: Newman Pies, MD;  Location: [facility];  Service: ENT;  Laterality: N/A;   FLEXIBLE SIGMOIDOSCOPY     GASTROJEJUNOSTOMY  01/2018   MULTIPLE TOOTH EXTRACTIONS  2005   NASAL SEPTOPLASTY W/ TURBINOPLASTY Bilateral 11/27/2018   Procedure: NASAL SEPTOPLASTY WITH BILATERAL TURBINATE REDUCTION;  Surgeon: Newman Pies, MD;  Location: [facility];  Service: ENT;  Laterality: Bilateral;   SHOULDER SURGERY     right   SINUS ENDO WITH FUSION N/A 11/27/2018   Procedure: SINUS ENDO WITH FUSION;  Surgeon: Newman Pies, MD;  Location: [facility];  Service: ENT;  Laterality: N/A;   UPPER GASTROINTESTINAL ENDOSCOPY     VASECTOMY     bilateral w/lysis of penile adhesions   Allergies  Allergen Reactions   Zithromax [Azithromycin] Hypertension   Lisinopril Swelling    SWELLING REACTION UNSPECIFIED    Tessalon Perles [Benzonatate]     hypertension   Prior to Admission medications   Medication Sig Start Date End Date Taking? Authorizing Provider  albuterol (VENTOLIN HFA) 108 (90 Base) MCG/ACT inhaler Inhale 1-2 puffs into the lungs every 6 (six) hours as needed for wheezing or shortness of breath. 08/04/22   Garrison, [place] N, FNP  Calcium Carb-Cholecalciferol (CALCIUM 1000 + D PO) Take by mouth.    [provider]  cetirizine (ZYRTEC) 10 MG tablet Take 1 tablet (10 mg total) by mouth 2 (two) times daily. 01/12/23   Marcelyn Bruins, MD  cyanocobalamin (VITAMIN B12) 1000 MCG/ML injection INJECT INTRAMuscularly ONCE FOR A DOSE 10/20/22   Iva Boop, MD  cyclobenzaprine (FLEXERIL) 10 MG tablet Take 10 mg by mouth as  needed. 07/11/13   [provider]  doxazosin (CARDURA) 8 MG tablet TAKE ONE TABLET BY MOUTH AT BEDTIME 02/05/23   [name], MD  HYDROcodone bit-homatropine [facility]) 5-1.5 MG/5ML syrup 85m by mouth a bedtime as needed for cough. 04/09/23   [name], MD  montelukast (SINGULAIR) 10 MG tablet TAKE 1 TABLET BY MOUTH EVERYDAY AT BEDTIME 02/16/23   Marcelyn Bruins, MD  Multiple Vitamins-Calcium (MULTI-DAY/CALCIUM/EXTRA IRON PO) Take by mouth.    [provider]  olmesartan (BENICAR) 20 MG tablet Take 1 tablet (20 mg total) by mouth daily. 10/02/22   [name], MD  Olopatadine-Mometasone [facility]) [phone] MCG/ACT SUSP Place 2 sprays into the nose 2 (two) times daily as needed (runny/stuffy nose). Patient not taking: Reported on 01/12/2023 09/01/22   Marcelyn Bruins, MD  ondansetron (ZOFRAN-ODT) 8 MG disintegrating tablet Take 1 tablet (8 mg total) by mouth every 8 (eight) hours as needed for nausea or vomiting. 02/14/23   Iva Boop, MD  rizatriptan (MAXALT-MLT) 10 MG disintegrating tablet TAKE 1 TABLET 3  TIME A DA Y AS NEEDED FOR MIGRAINE 01/26/20   Iva Boop, MD  verapamil (CALAN-SR) 180 MG CR tablet Take 2 tablets (360 mg total) by mouth daily. 10/02/22   [name], MD   Social History   Socioeconomic History   Marital status: Married    Spouse name: Not on file   Number of children: 3   Years of education: Some college   Highest education level: Associate degree: occupational, Scientist, product/process development, or vocational program  Occupational History   Occupation: Set designer: [employer]  Tobacco Use   Smoking status: Former    Current packs/day: 0.00    Average packs/day: 0.8 packs/day for 21.0 years (15.8 ttl pk-yrs)    Types: Cigarettes    Start date: 12/01/1996    Quit date: 12/01/2017    Years since quitting: 5.4    Passive exposure: Current (once in a while)   Smokeless tobacco: Former    Types: Snuff   Tobacco comments:     Counseling sheet given in exam room   Vaping Use   Vaping status: Never Used  Substance and Sexual Activity   Alcohol use: Not Currently   Drug use: No   Sexual activity: Yes    Partners: Female  Other Topics Concern   Not on file  Social History Narrative   Married. Education: [employer].    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    Right-handed   Caffeine: 3 sodas per day   Occasional alcohol no drugs   Former smoker former snuff user   Social Determinants of Corporate investment banker Strain: Low Risk  (04/09/2023)   Overall Financial Resource Strain (CARDIA)    Difficulty of Paying Living Expenses: Not hard at all  Food Insecurity: No Food Insecurity (04/09/2023)   Hunger Vital Sign    Worried About Running Out of Food in the Last Year: Never true    Ran Out of Food in the Last Year: Never true  Transportation Needs: No Transportation Needs (04/09/2023)   PRAPARE - Administrator, Civil Service (Medical): No    Lack of Transportation (Non-Medical): No  Physical Activity: Insufficiently Active (04/09/2023)   Exercise Vital Sign    Days of Exercise per Week: 2 days    Minutes of Exercise per Session: 10 min  Stress: No Stress Concern Present (04/09/2023)   [employer] of Occupational Health - Occupational Stress Questionnaire    Feeling of Stress : Not at all  Social Connections: Moderately Integrated (04/09/2023)   Social Connection and Isolation Panel [NHANES]    Frequency of Communication with Friends and Family: More than three times a week    Frequency of Social Gatherings with Friends and Family: Once a week    Attends Religious Services: 1 to 4 times per year    Active Member of [employer] or Organizations: No    Attends Banker Meetings: Never    Marital Status: Married  Catering manager Violence: Not At Risk (08/16/2022)   Humiliation, Afraid, Rape, and Kick questionnaire    Fear of Current or Ex-Partner:  No    Emotionally Abused: No    Physically Abused: No    Sexually Abused: No    Review of Systems Per HPI  Objective:   Vitals:   04/25/23 1426  BP: 138/80  Pulse: 89  Temp: 98.3 F (36.8 C)  TempSrc: Temporal  SpO2: 99%  Weight: Marland Kitchen)  366 lb 12.8 oz (166.4 kg)  Height: 6' (1.829 m)     Physical Exam Vitals reviewed.  Constitutional:      General: He is not in acute distress.    Appearance: Normal appearance. He is well-developed.  HENT:     Head: Normocephalic and atraumatic.  Cardiovascular:     Rate and Rhythm: Normal rate.  Pulmonary:     Effort: Pulmonary effort is normal.  Musculoskeletal:     Comments: Spine: slight discomfort of mid T-spine, multiple levels with percussion, no appreciable paraspinal spasm.  Lumbar spine, minimal discomfort midline, no focal spasm appreciated.  SI joint nontender, negative seated straight leg raise with slight discomfort into hamstrings only.  No radicular symptoms or change in back pain.  Able to heel and toe walk without difficulty.  Ambulating without assistive device.  Right hip: Discomfort with terminal hip flexion, internal rotation.  Locates area of discomfort into the ASIS area.  Trochanteric bursa nontender.  Neurological:     Mental Status: He is alert and oriented to person, place, and time.  Psychiatric:        Mood and Affect: Mood normal.        Assessment & Plan:  Craig Guzman. is a 52 y.o. male . Midline low back pain without sciatica, unspecified chronicity - Plan: DG Lumbar Spine Complete Non-traumatic mid back pain - Plan: DG Thoracic Spine 2 View Right hip pain - Plan: DG HIP UNILAT W OR W/O PELVIS 2-3 VIEWS RIGHT  -No known specific injury.  Does report fall a few months ago but pain had preceded the fall and denies any change in pain with that fall.  He does have a few areas of midline tenderness and based on timing of symptoms we will check some imaging to rule out underlying compression fracture  or bony involvement given prior prolonged use of prednisone.  Depending next results would consider PT eval versus Ortho eval.  Symptomatic care for now, handout given on chronic back pain.  X-ray results should be available for his chiropractor if needed once those are read.  -Additionally with hip symptoms could have component of arthritic changes, and depending on x-ray results consider Ortho eval versus PT as above.  RTC precautions.  Flu vaccine need - Plan: Flu vaccine trivalent PF, 6mos and older(Flulaval,Afluria,Fluarix,Fluzone)   No orders of the defined types were placed in this encounter.  Patient Instructions  Back pain, hip pain could be related to arthritis.  [facility] at the [facility] location below, and once I review those results can discuss next steps.  See information below on chronic back pain.  Physical therapy may be helpful but again can discuss once I see x-ray results and then can decide if PT or orthopedic evaluation recommended.  You should be able to pull up your x-ray results for your chiropractor as well once those reports are available.  Return to the clinic or go to the nearest emergency room if any of your symptoms worsen or new symptoms occur.  Chronic Back Pain Chronic back pain is back pain that lasts longer than 3 months. The cause of your back pain may not be known. Some common causes include: Wear and tear (degenerative disease) of the bones, disks, or tissues that connect bones to each other (ligaments) in your back. Inflammation and stiffness in your back (arthritis). If you have chronic back pain, you may have times when the pain is more intense (flare-ups). You can also learn to manage the  pain with home care. Follow these instructions at home: Watch for any changes in your symptoms. Take these actions to help with your pain: Managing pain and stiffness     If told, put ice on the painful area. You may be told to apply ice for the first 24-48 hours after a  flare-up starts. Put ice in a plastic bag. Place a towel between your skin and the bag. Leave the ice on for 20 minutes, 2-3 times per day. If told, apply heat to the affected area as often as told by your health care provider. Use the heat source that your provider recommends, such as a moist heat pack or a heating pad. Place a towel between your skin and the heat source. Leave the heat on for 20-30 minutes. If your skin turns bright red, remove the ice or heat right away to prevent skin damage. The risk of damage is higher if you cannot feel pain, heat, or cold. Try soaking in a warm tub. Activity        Avoid bending and other activities that make the pain worse. Have good posture when you stand or sit. When you stand, keep your upper back and neck straight, with your shoulders pulled back. Avoid slouching. When you sit, keep your back straight. Relax your shoulders. Do not round your shoulders or pull them backward. Do not sit or stand in one place for too long. Take brief periods of rest during the day. This will reduce your pain. Resting in a lying or standing position is often better than sitting to rest. When you rest for longer periods, mix in some mild activity or stretching between periods of rest. This will help to prevent stiffness and pain. Get regular exercise. Ask your provider what activities are safe for you. You may have to avoid lifting. Ask your provider how much you can safely lift. If you do lift, always use the right technique. This means you should: Bend your knees. Keep the load close to your body. Avoid twisting. Medicines Take over-the-counter and prescription medicines only as told by your provider. You may need to take medicines for pain and inflammation. These may be taken by mouth or put on the skin. You may also be given muscle relaxants. Ask your provider if the medicine prescribed to you: Requires you to avoid driving or using machinery. Can cause  constipation. You may need to take these actions to prevent or treat constipation: Drink enough fluid to keep your pee (urine) pale yellow. Take over-the-counter or prescription medicines. Eat foods that are high in fiber, such as beans, whole grains, and fresh fruits and vegetables. Limit foods that are high in fat and processed sugars, such as fried or sweet foods. General instructions  Sleep on a firm mattress in a comfortable position. Try lying on your side with your knees slightly bent. If you lie on your back, put a pillow under your knees. Do not use any products that contain nicotine or tobacco. These products include cigarettes, chewing tobacco, and vaping devices, such as e-cigarettes. If you need help quitting, ask your provider. Contact a health care provider if: You have pain that does not get better with rest or medicine. You have new pain. You have a fever. You lose weight quickly. You have trouble doing your normal activities. You feel weak or numb in one or both of your legs or feet. Get help right away if: You are not able to control when you pee or  poop. You have severe back pain and: Nausea or vomiting. Pain in your chest or abdomen. Shortness of breath. You faint. These symptoms may be an emergency. Get help right away. Call 911. Do not wait to see if the symptoms will go away. Do not drive yourself to the hospital. This information is not intended to replace advice given to you by your health care provider. Make sure you discuss any questions you have with your health care provider. Document Revised: 02/06/2022 Document Reviewed: 02/06/2022 Elsevier Patient Education  2024 Elsevier Inc.   Hip Pain The hip is the joint between the upper legs and the lower pelvis. The bones, cartilage, tendons, and muscles of your hip joint support your body and allow you to move around. Hip pain can range from a minor ache to severe pain in one or both of your hips. The pain  may be felt on the inside of the hip joint near the groin, or on the outside near the buttocks and upper thigh. You may also have swelling or stiffness in your hip area. Follow these instructions at home: Managing pain, stiffness, and swelling     If told, put ice on the painful area. Put ice in a plastic bag. Place a towel between your skin and the bag. Leave the ice on for 20 minutes, 2-3 times a day. If told, apply heat to the affected area as often as told by your health care provider. Use the heat source that your provider recommends, such as a moist heat pack or a heating pad. Place a towel between your skin and the heat source. Leave the heat on for 20-30 minutes. If your skin turns bright red, remove the ice or heat right away to prevent skin damage. The risk of damage is higher if you cannot feel pain, heat, or cold. Activity Do exercises as told by your provider. Avoid activities that cause pain. General instructions  Take over-the-counter and prescription medicines only as told by your provider. Keep a journal of your symptoms. Write down: How often you have hip pain. The location of your pain. What the pain feels like. What makes the pain worse. Sleep with a pillow between your legs on your most comfortable side. Keep all follow-up visits. Your provider will monitor your pain and activity. Contact a health care provider if: You cannot put weight on your leg. Your pain or swelling gets worse after a week. It gets harder to walk. You have a fever. Get help right away if: You fall. You have a sudden increase in pain and swelling in your hip. Your hip is red or swollen or very tender to touch. This information is not intended to replace advice given to you by your health care provider. Make sure you discuss any questions you have with your health care provider. Document Revised: 02/21/2022 Document Reviewed: 02/21/2022 Elsevier Patient Education  2024 Elsevier  Inc.     Signed,   Meredith Staggers, MD [facility], [facility] 04/25/23 3:14 PM

## 2023-04-26 ENCOUNTER — Ambulatory Visit (INDEPENDENT_AMBULATORY_CARE_PROVIDER_SITE_OTHER)
Admission: RE | Admit: 2023-04-26 | Discharge: 2023-04-26 | Disposition: A | Discharge status: Home / Self Care | Payer: Medicare Other | Source: Ambulatory Visit | Attending: Family Medicine | Admitting: Family Medicine

## 2023-04-26 DIAGNOSIS — M549 Dorsalgia, unspecified: Secondary | ICD-10-CM | POA: Diagnosis not present

## 2023-04-26 DIAGNOSIS — M545 Low back pain, unspecified: Secondary | ICD-10-CM | POA: Diagnosis not present

## 2023-04-26 DIAGNOSIS — M5136 Other intervertebral disc degeneration, lumbar region with discogenic back pain only: Secondary | ICD-10-CM | POA: Diagnosis not present

## 2023-04-26 DIAGNOSIS — M25551 Pain in right hip: Secondary | ICD-10-CM | POA: Diagnosis not present

## 2023-04-26 DIAGNOSIS — M48061 Spinal stenosis, lumbar region without neurogenic claudication: Secondary | ICD-10-CM | POA: Diagnosis not present

## 2023-04-26 DIAGNOSIS — M160 Bilateral primary osteoarthritis of hip: Secondary | ICD-10-CM | POA: Diagnosis not present

## 2023-04-26 DIAGNOSIS — M438X4 Other specified deforming dorsopathies, thoracic region: Secondary | ICD-10-CM | POA: Diagnosis not present

## 2023-04-26 DIAGNOSIS — M546 Pain in thoracic spine: Secondary | ICD-10-CM | POA: Diagnosis not present

## 2023-05-16 ENCOUNTER — Ambulatory Visit: Payer: Medicare Other | Admitting: Family Medicine

## 2023-05-16 VITALS — BP 130/84 | HR 87 | Temp 98.5°F | Ht 72.0 in | Wt 369.2 lb

## 2023-05-16 DIAGNOSIS — M545 Low back pain, unspecified: Secondary | ICD-10-CM

## 2023-05-16 DIAGNOSIS — M5136 Other intervertebral disc degeneration, lumbar region with discogenic back pain only: Secondary | ICD-10-CM | POA: Diagnosis not present

## 2023-05-16 DIAGNOSIS — M160 Bilateral primary osteoarthritis of hip: Secondary | ICD-10-CM | POA: Diagnosis not present

## 2023-05-16 DIAGNOSIS — M25552 Pain in left hip: Secondary | ICD-10-CM | POA: Diagnosis not present

## 2023-05-16 DIAGNOSIS — M25551 Pain in right hip: Secondary | ICD-10-CM

## 2023-05-16 MED ORDER — CYCLOBENZAPRINE HCL 10 MG PO TABS: 5.0000 mg | ORAL_TABLET | Freq: Three times a day (TID) | ORAL | 0 refills | Status: AC | PRN

## 2023-05-16 NOTE — Patient Instructions (Signed)
I will refer you to physical therapy for back pain and orthopedics for your back pain and hip pain to decide on other treatments.  Muscle relaxant Flexeril refilled temporarily for now but let me know if additional medication needed.  We would want to be careful with what we use with your intestinal issues.  Let me know if additional medication needed.  Follow-up in 6 weeks and we can review chronic medications at that time with labs and check-in on your back at that time.  If any new or worsening symptoms sooner please be seen.  Take care!  Chronic Back Pain Chronic back pain is back pain that lasts longer than 3 months. The cause of your back pain may not be known. Some common causes include: Wear and tear (degenerative disease) of the bones, disks, or tissues that connect bones to each other (ligaments) in your back. Inflammation and stiffness in your back (arthritis). If you have chronic back pain, you may have times when the pain is more intense (flare-ups). You can also learn to manage the pain with home care. Follow these instructions at home: Watch for any changes in your symptoms. Take these actions to help with your pain: Managing pain and stiffness     If told, put ice on the painful area. You may be told to apply ice for the first 24-48 hours after a flare-up starts. Put ice in a plastic bag. Place a towel between your skin and the bag. Leave the ice on for 20 minutes, 2-3 times per day. If told, apply heat to the affected area as often as told by your health care provider. Use the heat source that your provider recommends, such as a moist heat pack or a heating pad. Place a towel between your skin and the heat source. Leave the heat on for 20-30 minutes. If your skin turns bright red, remove the ice or heat right away to prevent skin damage. The risk of damage is higher if you cannot feel pain, heat, or cold. Try soaking in a warm tub. Activity        Avoid bending and other  activities that make the pain worse. Have good posture when you stand or sit. When you stand, keep your upper back and neck straight, with your shoulders pulled back. Avoid slouching. When you sit, keep your back straight. Relax your shoulders. Do not round your shoulders or pull them backward. Do not sit or stand in one place for too long. Take brief periods of rest during the day. This will reduce your pain. Resting in a lying or standing position is often better than sitting to rest. When you rest for longer periods, mix in some mild activity or stretching between periods of rest. This will help to prevent stiffness and pain. Get regular exercise. Ask your provider what activities are safe for you. You may have to avoid lifting. Ask your provider how much you can safely lift. If you do lift, always use the right technique. This means you should: Bend your knees. Keep the load close to your body. Avoid twisting. Medicines Take over-the-counter and prescription medicines only as told by your provider. You may need to take medicines for pain and inflammation. These may be taken by mouth or put on the skin. You may also be given muscle relaxants. Ask your provider if the medicine prescribed to you: Requires you to avoid driving or using machinery. Can cause constipation. You may need to take these actions to  prevent or treat constipation: Drink enough fluid to keep your pee (urine) pale yellow. Take over-the-counter or prescription medicines. Eat foods that are high in fiber, such as beans, whole grains, and fresh fruits and vegetables. Limit foods that are high in fat and processed sugars, such as fried or sweet foods. General instructions  Sleep on a firm mattress in a comfortable position. Try lying on your side with your knees slightly bent. If you lie on your back, put a pillow under your knees. Do not use any products that contain nicotine or tobacco. These products include cigarettes,  chewing tobacco, and vaping devices, such as e-cigarettes. If you need help quitting, ask your provider. Contact a health care provider if: You have pain that does not get better with rest or medicine. You have new pain. You have a fever. You lose weight quickly. You have trouble doing your normal activities. You feel weak or numb in one or both of your legs or feet. Get help right away if: You are not able to control when you pee or poop. You have severe back pain and: Nausea or vomiting. Pain in your chest or abdomen. Shortness of breath. You faint. These symptoms may be an emergency. Get help right away. Call 911. Do not wait to see if the symptoms will go away. Do not drive yourself to the hospital. This information is not intended to replace advice given to you by your health care provider. Make sure you discuss any questions you have with your health care provider. Document Revised: 02/06/2022 Document Reviewed: 02/06/2022 Elsevier Patient Education  2024 [employer].

## 2023-05-16 NOTE — Progress Notes (Signed)
Subjective:  Patient ID: Craig Guzman., male    DOB: [birth year]  Age: 52 y.o. MRN: [id number]  CC:  Chief Complaint  Patient presents with   Referral    Pt had some recent XR done and needs PT for hip and lower back notes he is here for review of treatment plan     HPI Craig Guzman. presents for    Follow-up with hip pain, back pain.  Last visit October 23.  Pain had preceded a fall few months ago.  Few areas of midline tenderness, imaging was performed.  He was under the care of chiropractor previously.  Imaging October 24 with x-rays of hip, thoracic spine, lumbar spine.  Mild bilateral hip osteoarthritis.  Thoracic spine, mild degenerative spurring in thoracic spine.  Lumbar spine x-ray with multilevel degenerative disc disease most prominent at L3-4 and L4-5.  No evidence of pars defects or focal bony abnormalities.   Has taken flexeril in past for migraines, none recently. No current meds for back pain.  No fever, weight loss, or night sweats. No bowel or bladder incontinence, no saddle anesthesia, no lower extremity weakness.   History Patient Active Problem List   Diagnosis Date Noted   Seasonal and perennial allergic rhinitis 09/07/2022   Allergic conjunctivitis of both eyes 09/07/2022   Feeding problems 12/02/2021   Abdominal migraine, not intractable ? 03/21/2019   Postsurgical dumping syndrome 02/14/2019   S/P functional endoscopic sinus surgery 11/27/2018   Abdominal pain, epigastric    Angioedema 04/19/2018   Intractable vomiting with nausea    Cigarette nicotine dependence without complication 05/15/2016   Shifting sleep-work schedule, affecting sleep 05/15/2016   Sleep deprivation 05/15/2016   Sleep related hypoventilation in conditions classified elsewhere 05/15/2016   Sleep related headaches 05/15/2016   Morbid obesity (HCC) 09/19/2013   Diarrhea 07/10/2013   Colloid thyroid nodule 04/04/2013   Elevated blood pressure reading in office with diagnosis  of hypertension 11/26/2012   Gastroparesis??? 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   History of migraine headaches    HTN (hypertension)    Kidney stone 09/20/2012   Migraine    Obesity    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    Pneumonia 02/27/2014   ED   Postsurgical dumping syndrome 02/14/2019   Sleep apnea    has cpap- does not wear    Past Surgical History:  Procedure Laterality Date   BALLOON DILATION N/A 12/06/2017   Procedure: BALLOON DILATION;  Surgeon: Iva Boop, MD;  Location: [facility];  Service: Endoscopy;  Laterality: N/A;   BIOPSY  06/21/2018   Procedure: BIOPSY;  Surgeon: Iva Boop, MD;  Location: [facility];  Service: Endoscopy;;   CHOLECYSTECTOMY  2011   [place]   CIRCUMCISION N/A 12/09/2018   Procedure: CIRCUMCISION ADULT;  Surgeon: Crista Elliot, MD;  Location: [facility];  Service: Urology;  Laterality:  N/A;   COLONOSCOPY  07/26/11   Crohn's colitis, ileitis   ESOPHAGOGASTRODUODENOSCOPY  05/04/11, 07/26/11   granulomatous gastritis - Crohn's   ESOPHAGOGASTRODUODENOSCOPY (EGD) WITH PROPOFOL N/A 03/28/2017   Procedure: ESOPHAGOGASTRODUODENOSCOPY (EGD) WITH PROPOFOL  with balloon dilation of duodenum;  Surgeon: Benancio Deeds, MD;  Location: [facility];  Service: Gastroenterology;  Laterality: N/A;   ESOPHAGOGASTRODUODENOSCOPY (EGD) WITH PROPOFOL N/A 12/06/2017   Procedure: ESOPHAGOGASTRODUODENOSCOPY (EGD)  WITH PROPOFOL;  Surgeon: Iva Boop, MD;  Location: [facility];  Service: Endoscopy;  Laterality: N/A;   ESOPHAGOGASTRODUODENOSCOPY (EGD) WITH PROPOFOL N/A 06/21/2018   Procedure: ESOPHAGOGASTRODUODENOSCOPY (EGD) WITH PROPOFOL;  Surgeon: Iva Boop, MD;  Location: [facility];  Service: Endoscopy;  Laterality: N/A;   ETHMOIDECTOMY N/A 11/27/2018   Procedure: ENDOSCOPIC TOTAL ETHMOIDECTOMY;  Surgeon: Newman Pies, MD;  Location: [facility];  Service: ENT;  Laterality: N/A;   FLEXIBLE SIGMOIDOSCOPY     GASTROJEJUNOSTOMY  01/2018   MULTIPLE TOOTH EXTRACTIONS  2005   NASAL SEPTOPLASTY W/ TURBINOPLASTY Bilateral 11/27/2018   Procedure: NASAL SEPTOPLASTY WITH BILATERAL TURBINATE REDUCTION;  Surgeon: Newman Pies, MD;  Location: [facility];  Service: ENT;  Laterality: Bilateral;   SHOULDER SURGERY     right   SINUS ENDO WITH FUSION N/A 11/27/2018   Procedure: SINUS ENDO WITH FUSION;  Surgeon: Newman Pies, MD;  Location: [facility];  Service: ENT;  Laterality: N/A;   UPPER GASTROINTESTINAL ENDOSCOPY     VASECTOMY     bilateral w/lysis of penile adhesions   Allergies  Allergen Reactions   Zithromax [Azithromycin] Hypertension   Lisinopril Swelling    SWELLING REACTION UNSPECIFIED    Tessalon Perles [Benzonatate]     hypertension   Prior to Admission medications   Medication Sig Start Date End Date Taking? Authorizing Provider  albuterol (VENTOLIN HFA) 108 (90 Base) MCG/ACT inhaler Inhale 1-2 puffs into the lungs every 6 (six) hours as needed for wheezing or shortness of breath. 08/04/22   Garrison, [place] N, FNP  Calcium Carb-Cholecalciferol (CALCIUM 1000 + D PO) Take by mouth.    [provider]  cetirizine (ZYRTEC) 10 MG tablet Take 1 tablet (10 mg total) by mouth 2 (two) times daily. 01/12/23   Marcelyn Bruins, MD  cyanocobalamin (VITAMIN B12) 1000 MCG/ML injection INJECT INTRAMuscularly ONCE FOR A DOSE 10/20/22   Iva Boop, MD  cyclobenzaprine (FLEXERIL) 10 MG tablet Take 10 mg  by mouth as needed. 07/11/13   [provider]  doxazosin (CARDURA) 8 MG tablet TAKE ONE TABLET BY MOUTH AT BEDTIME 02/05/23   [name], MD  HYDROcodone bit-homatropine [facility]) 5-1.5 MG/5ML syrup 1m by mouth a bedtime as needed for cough. 04/09/23   [name], MD  montelukast (SINGULAIR) 10 MG tablet TAKE 1 TABLET BY MOUTH EVERYDAY AT BEDTIME 02/16/23   Marcelyn Bruins, MD  Multiple Vitamins-Calcium (MULTI-DAY/CALCIUM/EXTRA IRON PO) Take by mouth.    [provider]  olmesartan (BENICAR) 20 MG tablet Take 1 tablet (20 mg total) by mouth daily. 10/02/22   [name], MD  Olopatadine-Mometasone [facility]) [phone] MCG/ACT SUSP Place 2 sprays into the nose 2 (two) times daily as needed (runny/stuffy nose). Patient not taking: Reported on 01/12/2023 09/01/22   Marcelyn Bruins, MD  ondansetron (ZOFRAN-ODT) 8 MG disintegrating tablet Take 1 tablet (8 mg total) by mouth every 8 (eight) hours as needed for nausea or vomiting. 02/14/23   Iva Boop, MD  rizatriptan (MAXALT-MLT) 10 MG disintegrating tablet TAKE 1 TABLET 3  TIME A DA Y AS NEEDED FOR MIGRAINE 01/26/20   Iva Boop, MD  verapamil (CALAN-SR) 180 MG CR tablet Take 2 tablets (360 mg total) by mouth daily. 10/02/22   [name], MD   Social History   Socioeconomic History   Marital status: Married    Spouse name: Not on file   Number of children: 3   Years of education: Some college   Highest education level: Associate degree: occupational, Scientist, product/process development, or vocational program  Occupational History   Occupation: Set designer: [employer]  Tobacco Use   Smoking status: Former    Current packs/day: 0.00    Average packs/day: 0.8 packs/day for 21.0 years (15.8 ttl pk-yrs)    Types: Cigarettes    Start date: 12/01/1996    Quit date: 12/01/2017    Years since quitting: 5.4    Passive exposure: Current (once in a while)   Smokeless tobacco: Former    Types: Snuff   Tobacco  comments:    Counseling sheet given in exam room   Vaping Use   Vaping status: Never Used  Substance and Sexual Activity   Alcohol use: Not Currently   Drug use: No   Sexual activity: Yes    Partners: Female  Other Topics Concern   Not on file  Social History Narrative   Married. Education: [employer].    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    Right-handed   Caffeine: 3 sodas per day   Occasional alcohol no drugs   Former smoker former snuff user   Social Determinants of Corporate investment banker Strain: Low Risk  (05/16/2023)   Overall Financial Resource Strain (CARDIA)    Difficulty of Paying Guzman Expenses: Not very hard  Food Insecurity: No Food Insecurity (05/16/2023)   Hunger Vital Sign    Worried About Running Out of Food in the Last Year: Never true    Ran Out of Food in the Last Year: Never true  Transportation Needs: No Transportation Needs (05/16/2023)   PRAPARE - Administrator, Civil Service (Medical): No    Lack of Transportation (Non-Medical): No  Physical Activity: Inactive (05/16/2023)   Exercise Vital Sign    Days of Exercise per Week: 0 days    Minutes of Exercise per Session: 10 min  Stress: No Stress Concern Present (05/16/2023)   [employer] of Occupational Health - Occupational Stress Questionnaire    Feeling of Stress : Not at all  Social Connections: Moderately Integrated (05/16/2023)   Social Connection and Isolation Panel [NHANES]    Frequency of Communication with Friends and Family: More than three times a week    Frequency of Social Gatherings with Friends and Family: Once a week    Attends Religious Services: 1 to 4 times per year    Active Member of [employer] or Organizations: No    Attends Banker Meetings: Never    Marital Status: Married  Catering manager Violence: Not At Risk (08/16/2022)   Humiliation, Afraid, Rape, and Kick questionnaire    Fear of Current or  Ex-Partner: No    Emotionally Abused: No    Physically Abused: No    Sexually Abused: No    Review of Systems   Objective:   Vitals:   05/16/23 1554  BP: 130/84  Pulse: 87  Temp: 98.5 F (36.9 C)  TempSrc: Temporal  SpO2: 98%  Weight: (!) 369 lb  3.2 oz (167.5 kg)  Height: 6' (1.829 m)     Physical Exam Constitutional:      General: He is not in acute distress.    Appearance: Normal appearance. He is well-developed.  HENT:     Head: Normocephalic and atraumatic.  Cardiovascular:     Rate and Rhythm: Normal rate.  Pulmonary:     Effort: Pulmonary effort is normal.  Neurological:     Mental Status: He is alert and oriented to person, place, and time.  Psychiatric:        Mood and Affect: Mood normal.    Given no change in symptoms from last visit,  MSK exam deferred.    Assessment & Plan:  Craig Guzman. is a 52 y.o. male . Midline low back pain without sciatica, unspecified chronicity - Plan: Ambulatory referral to Physical Therapy, Ambulatory referral to Orthopedic Surgery, cyclobenzaprine (FLEXERIL) 10 MG tablet  Bilateral hip pain - Plan: Ambulatory referral to Physical Therapy, Ambulatory referral to Orthopedic Surgery, cyclobenzaprine (FLEXERIL) 10 MG tablet  Osteoarthritis of both hips, unspecified osteoarthritis type - Plan: Ambulatory referral to Physical Therapy, Ambulatory referral to Orthopedic Surgery, cyclobenzaprine (FLEXERIL) 10 MG tablet  Degeneration of intervertebral disc of lumbar region with discogenic back pain - Plan: Ambulatory referral to Physical Therapy, Ambulatory referral to Orthopedic Surgery, cyclobenzaprine (FLEXERIL) 10 MG tablet  Low back pain with degenerative disc disease and mild osteoarthritis of both hips.  Some of his hip pain may be radiating from low back.  Short-term Flexeril provided, refer to physical therapy as well as orthopedics to decide on additional treatment.  Will need to be cautious with other pain medications  or treatments given his gastrointestinal issues.  Advised to let me know if additional medication or change in meds needed with potential side effects and risk of muscle relaxant discussed, lowest effective dose.  RTC/ER precautions if acute worsening or new symptoms.  Meds ordered this encounter  Medications   cyclobenzaprine (FLEXERIL) 10 MG tablet    Sig: Take 0.5-1 tablets (5-10 mg total) by mouth 3 (three) times daily as needed for muscle spasms (back pain.).    Dispense:  30 tablet    Refill:  0   Patient Instructions  I will refer you to physical therapy for back pain and orthopedics for your back pain and hip pain to decide on other treatments.  Muscle relaxant Flexeril refilled temporarily for now but let me know if additional medication needed.  We would want to be careful with what we use with your intestinal issues.  Let me know if additional medication needed.  Follow-up in 6 weeks and we can review chronic medications at that time with labs and check-in on your back at that time.  If any new or worsening symptoms sooner please be seen.  Take care!  Chronic Back Pain Chronic back pain is back pain that lasts longer than 3 months. The cause of your back pain may not be known. Some common causes include: Wear and tear (degenerative disease) of the bones, disks, or tissues that connect bones to each other (ligaments) in your back. Inflammation and stiffness in your back (arthritis). If you have chronic back pain, you may have times when the pain is more intense (flare-ups). You can also learn to manage the pain with home care. Follow these instructions at home: Watch for any changes in your symptoms. Take these actions to help with your pain: Managing pain and stiffness     If told,  put ice on the painful area. You may be told to apply ice for the first 24-48 hours after a flare-up starts. Put ice in a plastic bag. Place a towel between your skin and the bag. Leave the ice on for  20 minutes, 2-3 times per day. If told, apply heat to the affected area as often as told by your health care provider. Use the heat source that your provider recommends, such as a moist heat pack or a heating pad. Place a towel between your skin and the heat source. Leave the heat on for 20-30 minutes. If your skin turns bright red, remove the ice or heat right away to prevent skin damage. The risk of damage is higher if you cannot feel pain, heat, or cold. Try soaking in a warm tub. Activity        Avoid bending and other activities that make the pain worse. Have good posture when you stand or sit. When you stand, keep your upper back and neck straight, with your shoulders pulled back. Avoid slouching. When you sit, keep your back straight. Relax your shoulders. Do not round your shoulders or pull them backward. Do not sit or stand in one place for too long. Take brief periods of rest during the day. This will reduce your pain. Resting in a lying or standing position is often better than sitting to rest. When you rest for longer periods, mix in some mild activity or stretching between periods of rest. This will help to prevent stiffness and pain. Get regular exercise. Ask your provider what activities are safe for you. You may have to avoid lifting. Ask your provider how much you can safely lift. If you do lift, always use the right technique. This means you should: Bend your knees. Keep the load close to your body. Avoid twisting. Medicines Take over-the-counter and prescription medicines only as told by your provider. You may need to take medicines for pain and inflammation. These may be taken by mouth or put on the skin. You may also be given muscle relaxants. Ask your provider if the medicine prescribed to you: Requires you to avoid driving or using machinery. Can cause constipation. You may need to take these actions to prevent or treat constipation: Drink enough fluid to keep  your pee (urine) pale yellow. Take over-the-counter or prescription medicines. Eat foods that are high in fiber, such as beans, whole grains, and fresh fruits and vegetables. Limit foods that are high in fat and processed sugars, such as fried or sweet foods. General instructions  Sleep on a firm mattress in a comfortable position. Try lying on your side with your knees slightly bent. If you lie on your back, put a pillow under your knees. Do not use any products that contain nicotine or tobacco. These products include cigarettes, chewing tobacco, and vaping devices, such as e-cigarettes. If you need help quitting, ask your provider. Contact a health care provider if: You have pain that does not get better with rest or medicine. You have new pain. You have a fever. You lose weight quickly. You have trouble doing your normal activities. You feel weak or numb in one or both of your legs or feet. Get help right away if: You are not able to control when you pee or poop. You have severe back pain and: Nausea or vomiting. Pain in your chest or abdomen. Shortness of breath. You faint. These symptoms may be an emergency. Get help right away. Call 911. Do  not wait to see if the symptoms will go away. Do not drive yourself to the hospital. This information is not intended to replace advice given to you by your health care provider. Make sure you discuss any questions you have with your health care provider. Document Revised: 02/06/2022 Document Reviewed: 02/06/2022 Elsevier Patient Education  2024 Elsevier Inc.     Signed,   Meredith Staggers, MD [facility], [facility] 05/16/23 5:37 PM

## 2023-05-17 ENCOUNTER — Other Ambulatory Visit: Payer: Self-pay | Admitting: Family Medicine

## 2023-05-17 DIAGNOSIS — I10 Essential (primary) hypertension: Secondary | ICD-10-CM

## 2023-05-29 ENCOUNTER — Encounter (HOSPITAL_COMMUNITY): Payer: Self-pay | Admitting: Emergency Medicine

## 2023-05-29 ENCOUNTER — Ambulatory Visit: Payer: Medicare Other | Admitting: Physician Assistant

## 2023-05-29 ENCOUNTER — Ambulatory Visit (INDEPENDENT_AMBULATORY_CARE_PROVIDER_SITE_OTHER): Payer: Medicare Other

## 2023-05-29 ENCOUNTER — Other Ambulatory Visit: Payer: Self-pay

## 2023-05-29 ENCOUNTER — Ambulatory Visit (HOSPITAL_COMMUNITY)
Admission: EM | Admit: 2023-05-29 | Discharge: 2023-05-29 | Disposition: A | Discharge status: Home / Self Care | Payer: Medicare Other | Attending: Internal Medicine | Admitting: Internal Medicine

## 2023-05-29 DIAGNOSIS — R053 Chronic cough: Secondary | ICD-10-CM

## 2023-05-29 DIAGNOSIS — J069 Acute upper respiratory infection, unspecified: Secondary | ICD-10-CM

## 2023-05-29 DIAGNOSIS — R059 Cough, unspecified: Secondary | ICD-10-CM | POA: Diagnosis not present

## 2023-05-29 MED ORDER — DEXAMETHASONE SODIUM PHOSPHATE 10 MG/ML IJ SOLN
10.0000 mg | Freq: Once | INTRAMUSCULAR | Status: AC
  Administered 2023-05-29: 10 mg via INTRAMUSCULAR

## 2023-05-29 MED ORDER — DEXAMETHASONE SODIUM PHOSPHATE 10 MG/ML IJ SOLN
INTRAMUSCULAR | Status: AC
  Filled 2023-05-29: qty 1

## 2023-05-29 MED ORDER — AMOXICILLIN-POT CLAVULANATE 875-125 MG PO TABS: 1.0000 | ORAL_TABLET | Freq: Two times a day (BID) | ORAL | 0 refills | Status: DC

## 2023-05-29 NOTE — ED Notes (Signed)
Light yellow frothy phlegm.  Reports blood tinged phlegm a few days ago

## 2023-05-29 NOTE — ED Triage Notes (Signed)
Symptoms 2-3 weeks ago.  Symptoms went away.  One week ago symptoms reoccurred with fever, coughing, particularly at night.  Nyquil, coricidin have been tried.  Patient sounds and reports sob.  Unable to sleep due to coughing.  Patient has a history of bronchitis.  He feels like this is going on now.  Coughs, feels phlegm move, but cannot get phlegm out, sometimes resulting in vomiting.  Patient reports fevers-wakes in a puddle of water-no known measurement of temperature.

## 2023-05-29 NOTE — Discharge Instructions (Signed)
I will call if x-ray is abnormal.  I have prescribed you an antibiotic and you were given a steroid shot today in urgent care.  Please follow-up if any symptoms persist or worsen.

## 2023-05-29 NOTE — ED Provider Notes (Signed)
[facility]    CSN: [id number] Arrival date & time: 05/29/23  4010      History   Chief Complaint Chief Complaint  Patient presents with   Cough    HPI Craig Guzman. is a 52 y.o. male.   Patient presents with approximately 2 to 3-week history of nasal congestion, runny nose, productive cough.  Reports that he saw his PCP when symptoms first started and was prescribed a hydrocodone cough medication which seemed to minimally improve symptoms, but they returned with increased intensity a few days ago.  His wife has had similar symptoms.  He denies any documented fevers at home but reports that he has been waking up in sweats so he may have had a fever.  Denies history of asthma or COPD but does report that he gets bronchitis quite often.  He was a previous cigarette smoker.  Reports mild shortness of breath.   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 03/21/2019   Postsurgical dumping syndrome 02/14/2019   S/P functional endoscopic sinus surgery 11/27/2018   Abdominal pain, epigastric     Angioedema 04/19/2018   Intractable vomiting with nausea    Cigarette nicotine dependence without complication 05/15/2016   Shifting sleep-work schedule, affecting sleep 05/15/2016   Sleep deprivation 05/15/2016   Sleep related hypoventilation in conditions classified elsewhere 05/15/2016   Sleep related headaches 05/15/2016   Morbid obesity (HCC) 09/19/2013   Diarrhea 07/10/2013   Colloid thyroid nodule 04/04/2013   Elevated blood pressure reading in office with diagnosis of hypertension 11/26/2012   Gastroparesis??? 12/08/2011   Common migraine without aura 12/08/2011   Gastroduodenal Crohn's disease (HCC) 05/17/2011   GERD (gastroesophageal reflux disease) 04/21/2011    Past Surgical History:  Procedure Laterality Date   BALLOON DILATION N/A 12/06/2017   Procedure: BALLOON DILATION;  Surgeon: Iva Boop, MD;  Location: [facility];  Service: Endoscopy;  Laterality: N/A;   BIOPSY  06/21/2018   Procedure: BIOPSY;  Surgeon: Iva Boop, MD;  Location: [facility];  Service: Endoscopy;;   CHOLECYSTECTOMY  2011   [facility]   CIRCUMCISION N/A 12/09/2018   Procedure: CIRCUMCISION ADULT;  Surgeon: Crista Elliot, MD;  Location: [facility];  Service: Urology;  Laterality: N/A;   COLONOSCOPY  07/26/11   Crohn's colitis, ileitis   ESOPHAGOGASTRODUODENOSCOPY  05/04/11, 07/26/11   granulomatous gastritis - Crohn's   ESOPHAGOGASTRODUODENOSCOPY (EGD) WITH PROPOFOL N/A 03/28/2017   Procedure: ESOPHAGOGASTRODUODENOSCOPY (  EGD) WITH PROPOFOL  with balloon dilation of duodenum;  Surgeon: Benancio Deeds, MD;  Location: [facility];  Service: Gastroenterology;  Laterality: N/A;   ESOPHAGOGASTRODUODENOSCOPY (EGD) WITH PROPOFOL N/A 12/06/2017   Procedure: ESOPHAGOGASTRODUODENOSCOPY (EGD) WITH PROPOFOL;  Surgeon: Iva Boop, MD;  Location: [facility];  Service: Endoscopy;  Laterality: N/A;   ESOPHAGOGASTRODUODENOSCOPY (EGD) WITH PROPOFOL N/A 06/21/2018   Procedure:  ESOPHAGOGASTRODUODENOSCOPY (EGD) WITH PROPOFOL;  Surgeon: Iva Boop, MD;  Location: [facility];  Service: Endoscopy;  Laterality: N/A;   ETHMOIDECTOMY N/A 11/27/2018   Procedure: ENDOSCOPIC TOTAL ETHMOIDECTOMY;  Surgeon: Newman Pies, MD;  Location: [facility];  Service: ENT;  Laterality: N/A;   FLEXIBLE SIGMOIDOSCOPY     GASTROJEJUNOSTOMY  01/2018   MULTIPLE TOOTH EXTRACTIONS  2005   NASAL SEPTOPLASTY W/ TURBINOPLASTY Bilateral 11/27/2018   Procedure: NASAL SEPTOPLASTY WITH BILATERAL TURBINATE REDUCTION;  Surgeon: Newman Pies, MD;  Location: [facility];  Service: ENT;  Laterality: Bilateral;   SHOULDER SURGERY     right   SINUS ENDO WITH FUSION N/A 11/27/2018   Procedure: SINUS ENDO WITH FUSION;  Surgeon: Newman Pies, MD;  [facility];  Service: ENT;  Laterality: N/A;   UPPER GASTROINTESTINAL ENDOSCOPY     VASECTOMY     bilateral w/lysis of penile adhesions       Home Medications    Prior to Admission medications   Medication Sig Start Date End Date Taking? Authorizing Provider  amoxicillin-clavulanate (AUGMENTIN) 875-125 MG tablet Take 1 tablet by mouth every 12 (twelve) hours. 05/29/23  Yes Karleigh Bunte, Acie Fredrickson, FNP  albuterol (VENTOLIN HFA) 108 (90 Base) MCG/ACT inhaler Inhale 1-2 puffs into the lungs every 6 (six) hours as needed for wheezing or shortness of breath. Patient not taking: Reported on 05/29/2023 08/04/22   [place], [place] N, FNP  Calcium Carb-Cholecalciferol (CALCIUM 1000 + D PO) Take by mouth.    [provider]  cetirizine (ZYRTEC) 10 MG tablet Take 1 tablet (10 mg total) by mouth 2 (two) times daily. 01/12/23   Marcelyn Bruins, MD  cyanocobalamin (VITAMIN B12) 1000 MCG/ML injection INJECT INTRAMuscularly ONCE FOR A DOSE 10/20/22   Iva Boop, MD  cyclobenzaprine (FLEXERIL) 10 MG tablet Take 0.5-1 tablets (5-10 mg total) by mouth 3 (three) times daily as needed for muscle spasms (back pain.). 05/16/23   [name], MD  doxazosin (CARDURA) 8 MG tablet  TAKE ONE TABLET BY MOUTH AT BEDTIME 02/05/23   [name], MD  HYDROcodone bit-homatropine [facility]) 5-1.5 MG/5ML syrup 36m by mouth a bedtime as needed for cough. Patient not taking: Reported on 05/29/2023 04/09/23   [name], MD  montelukast (SINGULAIR) 10 MG tablet TAKE 1 TABLET BY MOUTH EVERYDAY AT BEDTIME 02/16/23   Marcelyn Bruins, MD  Multiple Vitamins-Calcium (MULTI-DAY/CALCIUM/EXTRA IRON PO) Take by mouth.    [provider]  olmesartan (BENICAR) 20 MG tablet Take 1 tablet (20 mg total) by mouth daily. 10/02/22   [name], MD  Olopatadine-Mometasone [facility]) [phone] MCG/ACT SUSP Place 2 sprays into the nose 2 (two) times daily as needed (runny/stuffy nose). Patient not taking: Reported on 01/12/2023 09/01/22   Marcelyn Bruins, MD  ondansetron (ZOFRAN-ODT) 8 MG disintegrating tablet Take 1 tablet (8 mg total) by mouth every 8 (eight) hours as needed for nausea or vomiting. 02/14/23   Iva Boop, MD  rizatriptan (MAXALT-MLT) 10 MG disintegrating tablet TAKE 1 TABLET 3 TIME A DA Y AS NEEDED FOR MIGRAINE 01/26/20  Iva Boop, MD  verapamil (CALAN-SR) 180 MG CR tablet TAKE TWO TABLETS BY MOUTH EVERY DAY 05/17/23   [name], MD    Family History Family History  Problem Relation Age of Onset   Colon polyps Father    Diabetes Father    Hypertension Father    Hypertension Mother    Asthma Mother    Heart disease Maternal Grandmother    Colon cancer Neg Hx    Rectal cancer Neg Hx    Stomach cancer Neg Hx    Esophageal cancer Neg Hx     Social History Social History   Tobacco Use   Smoking status: Former    Current packs/day: 0.00    Average packs/day: 0.8 packs/day for 21.0 years (15.8 ttl pk-yrs)    Types: Cigarettes    Start date: 12/01/1996    Quit date: 12/01/2017    Years since quitting: 5.4    Passive exposure: Current (once in a while)   Smokeless tobacco: Former    Types: Snuff   Tobacco comments:     Counseling sheet given in exam room   Vaping Use   Vaping status: Never Used  Substance Use Topics   Alcohol use: Not Currently   Drug use: No     Allergies   Zithromax [azithromycin], Lisinopril, and Tessalon perles [benzonatate]   Review of Systems Review of Systems Per HPI  Physical Exam Triage Vital Signs ED Triage Vitals [05/29/23 0949]  Encounter Vitals Group     BP (!) 143/100     Systolic BP Percentile      Diastolic BP Percentile      Pulse Rate (!) 114     Resp (!) 26     Temp 99.5 F (37.5 C)     Temp Source Oral     SpO2 98 %     Weight      Height      Head Circumference      Peak Flow      Pain Score      Pain Loc      Pain Education      Exclude from Growth Chart    No data found.  Updated Vital Signs BP 123/81 (BP Location: Left Arm) Comment (BP Location): large cuff  Pulse (!) 114   Temp 99.5 F (37.5 C) (Oral)   Resp (!) 26 Comment: coughing  SpO2 98%   Visual Acuity Right Eye Distance:   Left Eye Distance:   Bilateral Distance:    Right Eye Near:   Left Eye Near:    Bilateral Near:     Physical Exam Constitutional:      General: He is not in acute distress.    Appearance: Normal appearance. He is not toxic-appearing or diaphoretic.  HENT:     Head: Normocephalic and atraumatic.     Right Ear: Tympanic membrane and ear canal normal.     Left Ear: Tympanic membrane and ear canal normal.     Nose: Congestion present.     Mouth/Throat:     Mouth: Mucous membranes are moist.     Pharynx: No posterior oropharyngeal erythema.  Eyes:     Extraocular Movements: Extraocular movements intact.     Conjunctiva/sclera: Conjunctivae normal.     Pupils: Pupils are equal, round, and reactive to light.  Cardiovascular:     Rate and Rhythm: Normal rate and regular rhythm.     Pulses: Normal pulses.     Heart sounds:  Normal heart sounds.  Pulmonary:     Effort: Pulmonary effort is normal. No respiratory distress.     Breath sounds: Normal  breath sounds. No stridor. No wheezing, rhonchi or rales.  Abdominal:     General: Abdomen is flat. Bowel sounds are normal.     Palpations: Abdomen is soft.  Musculoskeletal:        General: Normal range of motion.     Cervical back: Normal range of motion.  Skin:    General: Skin is warm and dry.  Neurological:     General: No focal deficit present.     Mental Status: He is alert and oriented to person, place, and time. Mental status is at baseline.  Psychiatric:        Mood and Affect: Mood normal.        Behavior: Behavior normal.      UC Treatments / Results  Labs (all labs ordered are listed, but only abnormal results are displayed) Labs Reviewed - No data to display  EKG   Radiology DG Chest 2 View  Result Date: 05/29/2023 CLINICAL DATA:  Cough for several weeks. EXAM: CHEST - 2 VIEW COMPARISON:  May 21, 2018. FINDINGS: The heart size and mediastinal contours are within normal limits. Both lungs are clear. The visualized skeletal structures are unremarkable. IMPRESSION: No active cardiopulmonary disease. Electronically Signed   By: Lupita Raider M.D.   On: 05/29/2023 10:25    Procedures Procedures (including critical care time)  Medications Ordered in UC Medications  dexamethasone (DECADRON) injection 10 mg (10 mg Intramuscular Given 05/29/23 1045)    Initial Impression / Assessment and Plan / UC Course  I have reviewed the triage vital signs and the nursing notes.  Pertinent labs & imaging results that were available during my care of the patient were reviewed by me and considered in my medical decision making (see chart for details).     Suspect acute bronchitis versus secondary bacterial infection related to viral illness.  Chest x-ray completed that was negative for any acute cardiopulmonary process.  Patient is not tachypneic on exam and oxygen is normal so do not think that emergent evaluation is necessary.  Will treat with Augmentin antibiotic  today to cover for secondary bacterial infection.  Patient requesting steroid medication which I do think is reasonable given duration of symptoms.  Patient has taken steroids previously and tolerated well despite chronic gastrointestinal problems.  Patient requesting IM steroid as opposed to oral steroids so IM Decadron was administered today in urgent care.  Advised strict follow-up precautions if any symptoms persist or worsen.  Heart rate elevated during triage but was normal during physical exam which is reassuring.  Patient verbalized understanding and was agreeable with plan. Final Clinical Impressions(s) / UC Diagnoses   Final diagnoses:  Acute upper respiratory infection  Persistent cough     Discharge Instructions      I will call if x-ray is abnormal.  I have prescribed you an antibiotic and you were given a steroid shot today in urgent care.  Please follow-up if any symptoms persist or worsen.    ED Prescriptions     Medication Sig Dispense Auth. Provider   amoxicillin-clavulanate (AUGMENTIN) 875-125 MG tablet Take 1 tablet by mouth every 12 (twelve) hours. 14 tablet Barnhart, Acie Fredrickson, [place]      PDMP not reviewed this encounter.   Gustavus Bryant, [place] 05/29/23 1054

## 2023-06-05 ENCOUNTER — Ambulatory Visit (INDEPENDENT_AMBULATORY_CARE_PROVIDER_SITE_OTHER): Payer: Medicare Other | Admitting: Physician Assistant

## 2023-06-05 ENCOUNTER — Encounter: Payer: Self-pay | Admitting: Physician Assistant

## 2023-06-05 DIAGNOSIS — M5440 Lumbago with sciatica, unspecified side: Secondary | ICD-10-CM | POA: Diagnosis not present

## 2023-06-05 DIAGNOSIS — M545 Low back pain, unspecified: Secondary | ICD-10-CM | POA: Insufficient documentation

## 2023-06-05 NOTE — Progress Notes (Signed)
Office Visit Note   Patient: Craig Guzman.           Date of Birth: Dec 21, 1970           MRN: [id number] Visit Date: 06/05/2023              Requested by: [name], MD [address] [place],  [place] [address] PCP: [name], MD   Assessment & Plan: Visit Diagnoses:  1. Acute bilateral low back pain with sciatica, sciatica laterality unspecified     Plan: Patient is a 52 year old gentleman with a history of Crohn's disease he has been on steroids many times.  He has had a 63-month history of low back pain.  He is not weak he does not have any radicular findings he does have back pain that just radiates out to the outside of his hips no groin pain.  he does have degenerative changes throughout his lumbar and thoracic spine.  He cannot take any anti-inflammatories because of his Crohn's disease.  He has had no recent injury.  He is neurologically intact both strength wise and sensation wise.  Talked to he and his wife.  Would think he would be helped with some physical therapy.  We also discussed weight loss.  I would like him to do therapy for 6 weeks.  As long as does not have advancement of his symptoms I can see him back at that time.  Could consider an MRI if he is not better  Follow-Up Instructions: 6 weeks  Orders:  Orders Placed This Encounter  Procedures   Ambulatory referral to Physical Therapy   No orders of the defined types were placed in this encounter.     Procedures: No procedures performed   Clinical Data: No additional findings.   Subjective: Chief Complaint  Patient presents with   Middle Back - Pain   Right Hip - Pain    HPI pleasant 52 year old gentleman with 38-month history of mid thoracic and low back pain.  Denies any injury any loss of bowel or bladder control.  He did play sports when he was younger.  He has not really had much treatment he has Crohn's disease so he cannot use anti-inflammatories  Review of Systems  All  other systems reviewed and are negative.    Objective: Vital Signs: There were no vitals taken for this visit.  Physical Exam Constitutional:      Appearance: Normal appearance.  Pulmonary:     Effort: Pulmonary effort is normal.  Skin:    General: Skin is warm and dry.  Neurological:     Mental Status: He is alert.    Ortho Exam Examination he has excellent strength with resisted dorsiflexion plantarflexion of his ankles extension and flexion of his legs.  Sensation is intact.  Mild straight leg raise on right not really on the left.  He has no deformity or step-offs on his spine.  He does have pain that is accentuated with extension but not with flexion Specialty Comments:  No specialty comments available.  Imaging: No results found.   PMFS History: Patient Active Problem List   Diagnosis Date Noted   Low back pain 06/05/2023   Seasonal and perennial allergic rhinitis 09/07/2022   Allergic conjunctivitis of both eyes 09/07/2022   Feeding problems 12/02/2021   Abdominal migraine, not intractable ? 03/21/2019   Postsurgical dumping syndrome 02/14/2019   S/P functional endoscopic sinus surgery 11/27/2018   Abdominal pain, epigastric  Angioedema 04/19/2018   Intractable vomiting with nausea    Cigarette nicotine dependence without complication 05/15/2016   Shifting sleep-work schedule, affecting sleep 05/15/2016   Sleep deprivation 05/15/2016   Sleep related hypoventilation in conditions classified elsewhere 05/15/2016   Sleep related headaches 05/15/2016   Morbid obesity (HCC) 09/19/2013   Diarrhea 07/10/2013   Colloid thyroid nodule 04/04/2013   Elevated blood pressure reading in office with diagnosis of hypertension 11/26/2012   Gastroparesis??? 12/08/2011   Common migraine without aura 12/08/2011   Gastroduodenal Crohn's disease (HCC) 05/17/2011   GERD (gastroesophageal reflux disease) 04/21/2011   Past Medical History:  Diagnosis Date   Allergy    trees/  grasses/ animals/dust/mold   Biliary dyskinesia    Colloid thyroid nodule    COVID-19 01/2021   Crohn's disease (HCC)    Stomach, terminal ileum, cecum   Dyspnea    due to nasal /sinus congestion   Gastric ulcer    antral   GERD (gastroesophageal reflux disease)    History of kidney stones    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   History of migraine headaches    HTN (hypertension)    Kidney stone 09/20/2012   Migraine    Obesity    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    Pneumonia 02/27/2014   ED   Postsurgical dumping syndrome 02/14/2019   Sleep apnea    has cpap- does not wear     Family History  Problem Relation Age of Onset   Colon polyps Father    Diabetes Father    Hypertension Father    Hypertension Mother    Asthma Mother    Heart disease Maternal Grandmother    Colon cancer Neg Hx    Rectal cancer Neg Hx    Stomach cancer Neg Hx    Esophageal cancer Neg Hx     Past Surgical History:  Procedure Laterality Date   BALLOON DILATION N/A 12/06/2017   Procedure: BALLOON DILATION;  Surgeon: Iva Boop, MD;  [facility];  Service: Endoscopy;  Laterality: N/A;   BIOPSY  06/21/2018   Procedure: BIOPSY;  Surgeon: Iva Boop, MD;  Location: [facility];  Service: Endoscopy;;   CHOLECYSTECTOMY  2011   [facility]   CIRCUMCISION N/A 12/09/2018   Procedure: CIRCUMCISION ADULT;  Surgeon: Crista Elliot, MD;  Location: [facility];  Service: Urology;  Laterality: N/A;   COLONOSCOPY  07/26/11   Crohn's colitis, ileitis   ESOPHAGOGASTRODUODENOSCOPY  05/04/11, 07/26/11   granulomatous gastritis - Crohn's   ESOPHAGOGASTRODUODENOSCOPY (EGD) WITH PROPOFOL N/A 03/28/2017   Procedure: ESOPHAGOGASTRODUODENOSCOPY (EGD) WITH PROPOFOL  with balloon dilation of duodenum;  Surgeon: Benancio Deeds, MD;  [facility];  Service: Gastroenterology;  Laterality: N/A;   ESOPHAGOGASTRODUODENOSCOPY (EGD) WITH PROPOFOL N/A 12/06/2017   Procedure:  ESOPHAGOGASTRODUODENOSCOPY (EGD) WITH PROPOFOL;  Surgeon: Iva Boop, MD;  [facility]: [facility];  Service: Endoscopy;  Laterality: N/A;   ESOPHAGOGASTRODUODENOSCOPY (EGD) WITH PROPOFOL N/A 06/21/2018   Procedure: ESOPHAGOGASTRODUODENOSCOPY (EGD) WITH PROPOFOL;  Surgeon: Iva Boop, MD;  Location: [facility];  Service: Endoscopy;  Laterality: N/A;   ETHMOIDECTOMY N/A 11/27/2018   Procedure: ENDOSCOPIC TOTAL ETHMOIDECTOMY;  Surgeon: Newman Pies, MD;  Location: [facility];  Service: ENT;  Laterality: N/A;   FLEXIBLE SIGMOIDOSCOPY     GASTROJEJUNOSTOMY  01/2018   MULTIPLE TOOTH EXTRACTIONS  2005   NASAL SEPTOPLASTY W/ TURBINOPLASTY Bilateral 11/27/2018   Procedure: NASAL SEPTOPLASTY WITH BILATERAL TURBINATE REDUCTION;  Surgeon: Suszanne Conners,  Su, MD;  Location: [facility];  Service: ENT;  Laterality: Bilateral;   SHOULDER SURGERY     right   SINUS ENDO WITH FUSION N/A 11/27/2018   Procedure: SINUS ENDO WITH FUSION;  Surgeon: Newman Pies, MD;  Location: [facility];  Service: ENT;  Laterality: N/A;   UPPER GASTROINTESTINAL ENDOSCOPY     VASECTOMY     bilateral w/lysis of penile adhesions   Social History   Occupational History   Occupation: Set designer: [employer]  Tobacco Use   Smoking status: Former    Current packs/day: 0.00    Average packs/day: 0.8 packs/day for 21.0 years (15.8 ttl pk-yrs)    Types: Cigarettes    Start date: 12/01/1996    Quit date: 12/01/2017    Years since quitting: 5.5    Passive exposure: Current (once in a while)   Smokeless tobacco: Former    Types: Snuff   Tobacco comments:    Counseling sheet given in exam room   Vaping Use   Vaping status: Never Used  Substance and Sexual Activity   Alcohol use: Not Currently   Drug use: No   Sexual activity: Yes    Partners: Female

## 2023-06-20 ENCOUNTER — Ambulatory Visit: Payer: Medicare Other | Admitting: Physical Therapy

## 2023-06-20 ENCOUNTER — Encounter: Payer: Self-pay | Admitting: Physical Therapy

## 2023-06-20 DIAGNOSIS — R262 Difficulty in walking, not elsewhere classified: Secondary | ICD-10-CM

## 2023-06-20 DIAGNOSIS — M5459 Other low back pain: Secondary | ICD-10-CM

## 2023-06-20 DIAGNOSIS — M6281 Muscle weakness (generalized): Secondary | ICD-10-CM

## 2023-06-20 NOTE — Therapy (Signed)
OUTPATIENT PHYSICAL THERAPY THORACOLUMBAR EVALUATION   Patient Name: Craig Guzman. MRN: [id number] DOB:February 07, 1971, 52 y.o., male Today's Date: 06/20/2023  END OF SESSION:  PT End of Session - 06/20/23 1037     Visit Number 1    Number of Visits 12    Date for PT Re-Evaluation 08/15/23    Authorization Type [employer] MCR    PT Start Time 1014    PT Stop Time 1055    PT Time Calculation (min) 41 min    Activity Tolerance Patient tolerated treatment well    Behavior During Therapy WFL for tasks assessed/performed             Past Medical History:  Diagnosis Date   Allergy    trees/ grasses/ animals/dust/mold   Biliary dyskinesia    Colloid thyroid nodule    COVID-19 01/2021   Crohn's disease (HCC)    Stomach, terminal ileum, cecum   Dyspnea    due to nasal /sinus congestion   Gastric ulcer    antral   GERD (gastroesophageal reflux disease)    History of kidney stones    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   History of migraine headaches    HTN (hypertension)    Kidney stone 09/20/2012   Migraine    Obesity    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    Pneumonia 02/27/2014   ED   Postsurgical dumping syndrome 02/14/2019   Sleep apnea    has cpap- does not wear    Past Surgical History:  Procedure Laterality Date   BALLOON DILATION N/A 12/06/2017   Procedure: BALLOON DILATION;  Surgeon: Iva Boop, MD;  Location: [facility];  Service: Endoscopy;  Laterality: N/A;   BIOPSY  06/21/2018   Procedure: BIOPSY;  Surgeon: Iva Boop, MD;  Location: [facility];  Service: Endoscopy;;   CHOLECYSTECTOMY  2011   [place]   CIRCUMCISION N/A 12/09/2018   Procedure: CIRCUMCISION ADULT;  Surgeon: Crista Elliot, MD;  Location: [facility];  Service: Urology;  Laterality: N/A;   COLONOSCOPY  07/26/11   Crohn's colitis, ileitis   ESOPHAGOGASTRODUODENOSCOPY  05/04/11, 07/26/11   granulomatous gastritis - Crohn's   ESOPHAGOGASTRODUODENOSCOPY (EGD)  WITH PROPOFOL N/A 03/28/2017   Procedure: ESOPHAGOGASTRODUODENOSCOPY (EGD) WITH PROPOFOL  with balloon dilation of duodenum;  Surgeon: Benancio Deeds, MD;  Location: [facility];  Service: Gastroenterology;  Laterality: N/A;   ESOPHAGOGASTRODUODENOSCOPY (EGD) WITH PROPOFOL N/A 12/06/2017   Procedure: ESOPHAGOGASTRODUODENOSCOPY (EGD) WITH PROPOFOL;  Surgeon: Iva Boop, MD;  Location: [facility];  Service: Endoscopy;  Laterality: N/A;   ESOPHAGOGASTRODUODENOSCOPY (EGD) WITH PROPOFOL N/A 06/21/2018   Procedure: ESOPHAGOGASTRODUODENOSCOPY (EGD) WITH PROPOFOL;  Surgeon: Iva Boop, MD;  Location: [facility];  Service: Endoscopy;  Laterality: N/A;   ETHMOIDECTOMY N/A 11/27/2018   Procedure: ENDOSCOPIC TOTAL ETHMOIDECTOMY;  Surgeon: Newman Pies, MD;  Location: [facility];  Service: ENT;  Laterality: N/A;   FLEXIBLE SIGMOIDOSCOPY     GASTROJEJUNOSTOMY  01/2018   MULTIPLE TOOTH EXTRACTIONS  2005   NASAL SEPTOPLASTY W/ TURBINOPLASTY Bilateral 11/27/2018   Procedure: NASAL SEPTOPLASTY WITH BILATERAL TURBINATE REDUCTION;  Surgeon: Newman Pies, MD;  Location: [facility];  Service: ENT;  Laterality: Bilateral;   SHOULDER SURGERY     right   SINUS ENDO WITH FUSION N/A 11/27/2018   Procedure: SINUS ENDO WITH FUSION;  Surgeon: Newman Pies, MD;  Location: [facility];  Service: ENT;  Laterality: N/A;   UPPER GASTROINTESTINAL ENDOSCOPY  VASECTOMY     bilateral w/lysis of penile adhesions   Patient Active Problem List   Diagnosis Date Noted   Low back pain 06/05/2023   Seasonal and perennial allergic rhinitis 09/07/2022   Allergic conjunctivitis of both eyes 09/07/2022   Feeding problems 12/02/2021   Abdominal migraine, not intractable ? 03/21/2019   Postsurgical dumping syndrome 02/14/2019   S/P functional endoscopic sinus surgery 11/27/2018   Abdominal pain, epigastric    Angioedema 04/19/2018   Intractable vomiting with nausea    Cigarette nicotine dependence without complication 05/15/2016   Shifting  sleep-work schedule, affecting sleep 05/15/2016   Sleep deprivation 05/15/2016   Sleep related hypoventilation in conditions classified elsewhere 05/15/2016   Sleep related headaches 05/15/2016   Morbid obesity (HCC) 09/19/2013   Diarrhea 07/10/2013   Colloid thyroid nodule 04/04/2013   Elevated blood pressure reading in office with diagnosis of hypertension 11/26/2012   Gastroparesis??? 12/08/2011   Common migraine without aura 12/08/2011   Gastroduodenal Crohn's disease (HCC) 05/17/2011   GERD (gastroesophageal reflux disease) 04/21/2011    PCP: [name], MD   REFERRING PROVIDER: Persons, [place], [place]   REFERRING DIAG:    M54.40 (ICD-10-CM) - Acute bilateral low back pain with sciatica, sciatica laterality unspecified    Rationale for Evaluation and Treatment: Rehabilitation  THERAPY DIAG:  Other low back pain  Muscle weakness (generalized)  Difficulty in walking, not elsewhere classified  ONSET DATE: 6 month history of back pain  SUBJECTIVE:                                                                                                                                                                                           SUBJECTIVE STATEMENT: He has had back pain for at least 6 months. He has Crohn's disease so he cannot use anti-inflammatories. He just came back from trip to [place] which as 22 hour train ride so his back is really hurting today. He says pain is in center of the back but does not run down his legs.   PERTINENT HISTORY:  Chrohns disease, obesity, sleep apnea  PAIN:  NPRS scale: 9.5/10 upon arrival Pain location:center of low back  Pain description: constant, achy, sharp Aggravating factors: putting on socks and shoes. Standing, walking, sitting straight up Relieving factors: leaning back   PRECAUTIONS: None  RED FLAGS: None   WEIGHT BEARING RESTRICTIONS: No  FALLS:  Has patient fallen in last 6 months? Yes. Number of  falls 1, slipped on last step on clothes  OCCUPATION: disabillity  PLOF: Independent with basic ADLS  PATIENT GOALS: reduce the pain, loose weight  NEXT MD  VISIT: 07/31/23  OBJECTIVE:  Note: Objective measures were completed at Evaluation unless otherwise noted.  DIAGNOSTIC FINDINGS:  XR IMPRESSION: Mild bilateral hip osteoarthritis. "Multilevel degenerative disc disease, most prominent at L3-L4 and L4-L5. Mild thoracic degenerative spurring"  PATIENT SURVEYS:  Eval FOTO 26% functional, goal is 47%  COGNITION: Overall cognitive status: Within functional limits for tasks assessed     SENSATION: WFL  MUSCLE LENGTH: Hamstrings and paraspinals tight bilat  LUMBAR ROM:   AROM eval  Flexion 50% and pain worse  Extension 75% and pain improves  Right lateral flexion   Left lateral flexion   Right rotation 50%   Left rotation 50% and pain   (Blank rows = not tested)  LOWER EXTREMITY MMT:    MMT Right eval Left eval  Hip flexion 4 4  Hip extension    Hip abduction 4 4  Hip adduction    Hip internal rotation    Hip external rotation    Knee flexion 5 5  Knee extension 5 5  Ankle dorsiflexion    Ankle plantarflexion    Ankle inversion    Ankle eversion     (Blank rows = not tested)  LUMBAR SPECIAL TESTS:  Slump test: Negative  FUNCTIONAL TESTS:  Eval: Timed up and go (TUG): 16:16  GAIT: Comments: antalgic gait with wide BOS, slow gait velocity  TODAY'S TREATMENT:  Eval HEP creation and review with demonstration and trial set preformed, see below for details IFC Estim X 15 min to lumbar/thoracic P.S, with intensity turned up to tolerance and  with moist heat    PATIENT EDUCATION: Education details: HEP, PT plan of care, weight loss strategies Person educated: Patient Education method: Explanation, Demonstration, Verbal cues, and Handouts Education comprehension: verbalized understanding and needs further education   HOME EXERCISE PROGRAM: Access  Code: [email or web address] URL: [email or web address] Date: 06/20/2023 Prepared by: Ivery Quale  Exercises - Standing Lumbar Extension  - 2 x daily - 6 x weekly - 2 sets - 10 reps - 5 hold - Scapular Retraction with Resistance  - 2 x daily - 6 x weekly - 2 sets - 15 reps - Shoulder extension with resistance - Neutral  - 2 x daily - 6 x weekly - 2 sets - 15 reps - Standing Hip Extension with Counter Support  - 2 x daily - 6 x weekly - 1 sets - 15 reps - Standing 'L' Stretch at Counter  - 2 x daily - 6 x weekly - 1 sets - 10 reps - 5 hold - Supine Lower Trunk Rotation  - 2 x daily - 6 x weekly - 1 sets - 5 reps - 10 sec hold  ASSESSMENT:  CLINICAL IMPRESSION: Patient referred to PT for chronic back pain. We did discuss general weight loss strategies including tracking calories and walking program. He does not have any radicular signs present today. Patient will benefit from skilled PT to address below impairments, limitations and improve overall function.  OBJECTIVE IMPAIRMENTS: decreased activity tolerance, difficulty walking, decreased balance, decreased endurance, decreased mobility, decreased ROM, decreased strength, impaired flexibility, impaired LE use, postural dysfunction, and pain.  ACTIVITY LIMITATIONS: bending, lifting, carry, locomotion, cleaning, community activity,   PERSONAL FACTORS: see above PMH are also affecting patient's functional outcome.  REHAB POTENTIAL: Good  CLINICAL DECISION MAKING: Stable/uncomplicated  EVALUATION COMPLEXITY: Low    GOALS: Short term PT Goals Target date: 07/18/2023   Pt will be I and compliant with HEP. Baseline:  Goal status: New Pt will decrease  pain by 25% overall Baseline: Goal status: New  Long term PT goals Target date:08/15/2023   Pt will improve lumbar AROM to [facility] to improve functional mobility Baseline: Goal status: New Pt will improve  hip/knee strength to at least 4+/5 MMT to improve functional  strength Baseline: Goal status: New Pt will improve FOTO to at least 47 % functional to show improved function Baseline: Goal status: New Pt will reduce pain by overall 50% so less than 5/10 overall with usual activity Baseline:9.5/10 Goal status: Ne Pt will improve TUG time to less than 13 seconds to show improved gait speed and sit to stand ability.  Baseline: Goal status: New  PLAN: PT FREQUENCY: 1-2 times per week   PT DURATION: 6-8 weeks  PLANNED INTERVENTIONS (unless contraindicated): aquatic PT, Canalith repositioning, cryotherapy, Electrical stimulation, Iontophoresis with 4 mg/ml dexamethasome, Moist heat, traction, Ultrasound, gait training, Therapeutic exercise, balance training, neuromuscular re-education, patient/family education, prosthetic training, manual techniques, passive ROM, dry needling, taping, vasopnuematic device, vestibular, spinal manipulations, joint manipulations 97110-Therapeutic exercises, 97530- Therapeutic activity, 97112- Neuromuscular re-education, 97535- Self Care, [id number]- Manual therapy, [id number]- Gait training, [id number]- Aquatic Therapy, [id number]- Electrical stimulation (manual), and Dry Needling  PLAN FOR NEXT SESSION: TENS if desired, review HEP, begin Nu step, strength training and walking tolerance progressions as tolerated.   Date of referral: 06/05/23 Referring provider: Persons, [place], [place]  Referring diagnosis? M54.40 (ICD-10-CM) - Acute bilateral low back pain with sciatica, sciatica laterality unspecified Treatment diagnosis? (if different than referring diagnosis) m54.59  What was this (referring dx) caused by? Ongoing Issue and Arthritis  Ashby Dawes of Condition: Chronic (continuous duration > 3 months)   Laterality: Both  Current Functional Measure Score: FOTO 26% functional intake  Objective measurements identify impairments when they are compared to normal values, the uninvolved extremity, and prior level of function.  [x]  Yes  []   No  Objective assessment of functional ability: Severe functional limitations   Briefly describe symptoms: chronic back pain that limits his ability for standing and walking  How did symptoms start: unspecified, insidious onset  Average pain intensity:  Last 24 hours: 9.5/10  Past week: 03/07/09  How often does the pt experience symptoms? Constantly  How much have the symptoms interfered with usual daily activities? Extremely  How has condition changed since care began at this facility? NA - initial visit  In general, how is the patients overall health? Poor   BACK PAIN (STarT Back Screening Tool) No, did FOTO instead   April Manson, PT,DPT 06/20/2023, 10:38 AM

## 2023-06-25 ENCOUNTER — Ambulatory Visit: Payer: Medicare Other | Admitting: Family Medicine

## 2023-06-25 VITALS — BP 154/80 | HR 102 | Temp 99.3°F | Ht 72.0 in | Wt 371.2 lb

## 2023-06-25 DIAGNOSIS — E559 Vitamin D deficiency, unspecified: Secondary | ICD-10-CM

## 2023-06-25 DIAGNOSIS — R051 Acute cough: Secondary | ICD-10-CM

## 2023-06-25 DIAGNOSIS — J069 Acute upper respiratory infection, unspecified: Secondary | ICD-10-CM | POA: Diagnosis not present

## 2023-06-25 DIAGNOSIS — R7303 Prediabetes: Secondary | ICD-10-CM | POA: Diagnosis not present

## 2023-06-25 DIAGNOSIS — I10 Essential (primary) hypertension: Secondary | ICD-10-CM | POA: Diagnosis not present

## 2023-06-25 DIAGNOSIS — M5136 Other intervertebral disc degeneration, lumbar region with discogenic back pain only: Secondary | ICD-10-CM

## 2023-06-25 DIAGNOSIS — M545 Low back pain, unspecified: Secondary | ICD-10-CM | POA: Diagnosis not present

## 2023-06-25 LAB — COMPREHENSIVE METABOLIC PANEL
ALT: 30 U/L (ref 0–53)
AST: 34 U/L (ref 0–37)
Albumin: 3.6 g/dL (ref 3.5–5.2)
Alkaline Phosphatase: 109 U/L (ref 39–117)
BUN: 10 mg/dL (ref 6–23)
CO2: 25 meq/L (ref 19–32)
Calcium: 8.6 mg/dL (ref 8.4–10.5)
Chloride: 105 meq/L (ref 96–112)
Creatinine, Ser: 1.11 mg/dL (ref 0.40–1.50)
GFR: 76.91 mL/min (ref >60.00)
Glucose, Bld: 111 mg/dL — ABNORMAL HIGH (ref 70–99)
Potassium: 3.7 meq/L (ref 3.5–5.1)
Sodium: 139 meq/L (ref 135–145)
Total Bilirubin: 0.4 mg/dL (ref 0.2–1.2)
Total Protein: 7.6 g/dL (ref 6.0–8.3)

## 2023-06-25 LAB — LIPID PANEL
Cholesterol: 155 mg/dL (ref 0–200)
HDL: 33.4 mg/dL — ABNORMAL LOW (ref >39.00)
LDL Cholesterol: 96 mg/dL (ref 0–99)
NonHDL: 121.64
Total CHOL/HDL Ratio: 5
Triglycerides: 129 mg/dL (ref 0.0–149.0)
VLDL: 25.8 mg/dL (ref 0.0–40.0)

## 2023-06-25 LAB — HEMOGLOBIN A1C: Hgb A1c MFr Bld: 6.1 % (ref 4.6–6.5)

## 2023-06-25 LAB — POCT INFLUENZA A/B
Influenza A, POC: NEGATIVE
Influenza B, POC: NEGATIVE

## 2023-06-25 LAB — VITAMIN D 25 HYDROXY (VIT D DEFICIENCY, FRACTURES): VITD: 14.53 ng/mL — ABNORMAL LOW (ref 30.00–100.00)

## 2023-06-25 LAB — POC COVID19 BINAXNOW: SARS Coronavirus 2 Ag: NEGATIVE

## 2023-06-25 NOTE — Progress Notes (Signed)
Subjective:  Patient ID: Craig Living., male    DOB: 1970/08/09  Age: 52 y.o. MRN: [id number]  CC:  Chief Complaint  Patient presents with   Nasal Congestion    Pt notes went to [place] and when he came back got cold like sxs starting saturday, cough, phlem, pt notes no fever     HPI Craig Living. presents for   Nasal congestion: Started two days ago after trip to [place].  Some nasal congestion, PND, then cough next day. Dry cough usually. no fever, wheeze or dyspnea.  Tx. Mucinex some relief. No insomnia w/ cough. Few cough gtts. Nasal washes.   Hx of allergies, followed by allergist. no recent need for albuterol. Zyrtec daily. Not sure if taking singulair.   Back pain Referred to PT, Ortho at last visit, degenerative disc disease and mild osteoarthritis of both hips.  Short-term Flexeril prescription given.  Orthopedic eval noted on December 3.  Given his gastrointestinal history, held on anti-inflammatories.  6 weeks of physical therapy initially, then follow-up with consideration of MRI at that time if not improving. Improving some with PT. Still some noticeable pain at times but getting better.   Hypertension: Treated with doxazosin, olmesartan, verapamil without new side effects when last discussed at his physical.  No new side effects currently. Just took meds prior to ov.  Home readings:120-130/80-85.  BP Readings from Last 3 Encounters:  06/25/23 (!) 154/80  05/29/23 123/81  05/16/23 130/84   Lab Results  Component Value Date   CREATININE 1.00 10/02/2022   Prediabetes: Weight up a few pounds from last visit. Back issues as above. Walking 15 mins daily  Lab Results  Component Value Date   HGBA1C 5.9 10/02/2022   Wt Readings from Last 3 Encounters:  06/25/23 (!) 371 lb 3.2 oz (168.4 kg)  05/16/23 (!) 369 lb 3.2 oz (167.5 kg)  04/25/23 (!) 366 lb 12.8 oz (166.4 kg)   Migraine headaches Treated with rizatriptan - last dose 4 weeks ago. Intestinal  symptoms as well.   Vitamin D deficiency Treated with 50,000 units supplement weekly - prior. Off for a few months.  Last vitamin D Lab Results  Component Value Date   VD25OH 24.09 (L) 10/02/2022     History Patient Active Problem List   Diagnosis Date Noted   Low back pain 06/05/2023   Seasonal and perennial allergic rhinitis 09/07/2022   Allergic conjunctivitis of both eyes 09/07/2022   Feeding problems 12/02/2021   Abdominal migraine, not intractable ? 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   History of migraine headaches    HTN (hypertension)    Kidney stone 09/20/2012   Migraine    Obesity    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    Pneumonia 02/27/2014   ED   Postsurgical dumping  syndrome 02/14/2019   Sleep apnea    has cpap- does not wear    Past Surgical History:  Procedure Laterality Date   BALLOON DILATION N/A 12/06/2017   Procedure: BALLOON DILATION;  Surgeon: Iva Boop, MD;  Location: [facility];  Service: Endoscopy;  Laterality: N/A;   BIOPSY  06/21/2018   Procedure: BIOPSY;  Surgeon: Iva Boop, MD;  Location: [facility];  Service: Endoscopy;;   CHOLECYSTECTOMY  2011   [facility]   CIRCUMCISION N/A 12/09/2018   Procedure: CIRCUMCISION ADULT;  Surgeon: Crista Elliot, MD;  Location: [facility];  Service: Urology;  Laterality: N/A;   COLONOSCOPY  07/26/11   Crohn's colitis, ileitis   ESOPHAGOGASTRODUODENOSCOPY  05/04/11, 07/26/11   granulomatous gastritis - Crohn's   ESOPHAGOGASTRODUODENOSCOPY (EGD) WITH PROPOFOL N/A 03/28/2017   Procedure: ESOPHAGOGASTRODUODENOSCOPY (EGD) WITH PROPOFOL  with balloon dilation of duodenum;  Surgeon: Benancio Deeds, MD;  Location: [facility];  Service: Gastroenterology;  Laterality: N/A;   ESOPHAGOGASTRODUODENOSCOPY (EGD) WITH PROPOFOL N/A 12/06/2017   Procedure: ESOPHAGOGASTRODUODENOSCOPY (EGD) WITH PROPOFOL;  Surgeon: Iva Boop, MD;  Location: [facility];  Service: Endoscopy;  Laterality: N/A;   ESOPHAGOGASTRODUODENOSCOPY (EGD) WITH PROPOFOL N/A 06/21/2018   Procedure: ESOPHAGOGASTRODUODENOSCOPY (EGD) WITH PROPOFOL;  Surgeon: Iva Boop, MD;  Location: [facility];  Service: Endoscopy;  Laterality: N/A;   ETHMOIDECTOMY N/A 11/27/2018   Procedure: ENDOSCOPIC TOTAL ETHMOIDECTOMY;  Surgeon: Newman Pies, MD;  Location: [facility];  Service: ENT;  Laterality: N/A;   FLEXIBLE SIGMOIDOSCOPY     GASTROJEJUNOSTOMY  01/2018   MULTIPLE TOOTH EXTRACTIONS  2005   NASAL SEPTOPLASTY W/ TURBINOPLASTY Bilateral 11/27/2018   Procedure: NASAL SEPTOPLASTY WITH BILATERAL TURBINATE REDUCTION;  Surgeon: Newman Pies, MD;  Location: [facility];  Service: ENT;  Laterality: Bilateral;   SHOULDER SURGERY     right   SINUS ENDO WITH FUSION  N/A 11/27/2018   Procedure: SINUS ENDO WITH FUSION;  Surgeon: Newman Pies, MD;  Location: [facility];  Service: ENT;  Laterality: N/A;   UPPER GASTROINTESTINAL ENDOSCOPY     VASECTOMY     bilateral w/lysis of penile adhesions   Allergies  Allergen Reactions   Zithromax [Azithromycin] Hypertension   Lisinopril Swelling    SWELLING REACTION UNSPECIFIED    Tessalon Perles [Benzonatate]     hypertension   Prior to Admission medications   Medication Sig Start Date End Date Taking? Authorizing Provider  amoxicillin-clavulanate (AUGMENTIN) 875-125 MG tablet Take 1 tablet by mouth every 12 (twelve) hours. 05/29/23  Yes Mound, Rolly Salter E, FNP  Calcium Carb-Cholecalciferol (CALCIUM 1000 + D PO) Take by mouth.   Yes [provider]  cetirizine (ZYRTEC) 10 MG tablet Take 1 tablet (10 mg total) by mouth 2 (two) times daily. 01/12/23  Yes Padgett, Pilar Grammes, MD  cyanocobalamin (VITAMIN B12) 1000 MCG/ML injection INJECT INTRAMuscularly ONCE FOR A DOSE 10/20/22  Yes Iva Boop, MD  cyclobenzaprine (FLEXERIL) 10 MG tablet Take 0.5-1 tablets (5-10 mg total)  by mouth 3 (three) times daily as needed for muscle spasms (back pain.). 05/16/23  Yes [name], MD  doxazosin (CARDURA) 8 MG tablet TAKE ONE TABLET BY MOUTH AT BEDTIME 02/05/23  Yes [name], MD  montelukast (SINGULAIR) 10 MG tablet TAKE 1 TABLET BY MOUTH EVERYDAY AT BEDTIME 02/16/23  Yes Padgett, Pilar Grammes, MD  Multiple Vitamins-Calcium (MULTI-DAY/CALCIUM/EXTRA IRON PO) Take by mouth.   Yes [provider]  olmesartan (BENICAR) 20 MG tablet Take 1 tablet (20 mg total) by mouth daily. 10/02/22  Yes [name], MD  ondansetron (ZOFRAN-ODT) 8 MG disintegrating tablet Take 1 tablet (8 mg total) by mouth every 8 (eight) hours as needed for nausea or vomiting. 02/14/23  Yes Iva Boop, MD  rizatriptan (MAXALT-MLT) 10 MG disintegrating tablet TAKE 1 TABLET 3 TIME A DA Y AS NEEDED FOR MIGRAINE 01/26/20  Yes  Iva Boop, MD  verapamil (CALAN-SR) 180 MG CR tablet TAKE TWO TABLETS BY MOUTH EVERY DAY 05/17/23  Yes [name], MD  albuterol (VENTOLIN HFA) 108 (90 Base) MCG/ACT inhaler Inhale 1-2 puffs into the lungs every 6 (six) hours as needed for wheezing or shortness of breath. Patient not taking: Reported on 06/25/2023 08/04/22   Garrison, [place] N, FNP  HYDROcodone bit-homatropine [facility]) 5-1.5 MG/5ML syrup 76m by mouth a bedtime as needed for cough. Patient not taking: Reported on 06/25/2023 04/09/23   [name], MD  Olopatadine-Mometasone [facility]) [phone] MCG/ACT SUSP Place 2 sprays into the nose 2 (two) times daily as needed (runny/stuffy nose). Patient not taking: Reported on 06/25/2023 09/01/22   Marcelyn Bruins, MD   Social History   Socioeconomic History   Marital status: Married    Spouse name: Not on file   Number of children: 3   Years of education: Some college   Highest education level: Associate degree: occupational, Scientist, product/process development, or vocational program  Occupational History   Occupation: Set designer: [employer]  Tobacco Use   Smoking status: Former    Current packs/day: 0.00    Average packs/day: 0.8 packs/day for 21.0 years (15.8 ttl pk-yrs)    Types: Cigarettes    Start date: 12/01/1996    Quit date: 12/01/2017    Years since quitting: 5.5    Passive exposure: Current (once in a while)   Smokeless tobacco: Former    Types: Snuff   Tobacco comments:    Counseling sheet given in exam room   Vaping Use   Vaping status: Never Used  Substance and Sexual Activity   Alcohol use: Not Currently   Drug use: No   Sexual activity: Yes    Partners: Female  Other Topics Concern   Not on file  Social History Narrative   Married. Education: [employer].    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    Right-handed   Caffeine: 3 sodas per day   Occasional alcohol no drugs   Former smoker former snuff user   Social  Drivers of Corporate investment banker Strain: Low Risk  (06/25/2023)   Overall Financial Resource Strain (CARDIA)    Difficulty of Paying Living Expenses: Not hard at all  Food Insecurity: No Food Insecurity (06/25/2023)   Hunger Vital Sign    Worried About Running Out of Food in the Last Year: Never true    Ran Out of Food in the Last Year: Never true  Transportation Needs: No Transportation Needs (06/25/2023)  PRAPARE - Administrator, Civil Service (Medical): No    Lack of Transportation (Non-Medical): No  Physical Activity: Inactive (06/25/2023)   Exercise Vital Sign    Days of Exercise per Week: 0 days    Minutes of Exercise per Session: 10 min  Stress: No Stress Concern Present (06/25/2023)   [employer] of Occupational Health - Occupational Stress Questionnaire    Feeling of Stress : Not at all  Social Connections: Moderately Integrated (06/25/2023)   Social Connection and Isolation Panel [NHANES]    Frequency of Communication with Friends and Family: More than three times a week    Frequency of Social Gatherings with Friends and Family: Three times a week    Attends Religious Services: 1 to 4 times per year    Active Member of Clubs or Organizations: No    Attends Banker Meetings: Never    Marital Status: Married  Catering manager Violence: Not At Risk (08/16/2022)   Humiliation, Afraid, Rape, and Kick questionnaire    Fear of Current or Ex-Partner: No    Emotionally Abused: No    Physically Abused: No    Sexually Abused: No    Review of Systems Per HPI  Objective:   Vitals:   06/25/23 1019  BP: (!) 154/80  Pulse: (!) 102  Temp: 99.3 F (37.4 C)  TempSrc: Temporal  SpO2: 100%  Weight: (!) 371 lb 3.2 oz (168.4 kg)  Height: 6' (1.829 m)     Physical Exam Vitals reviewed.  Constitutional:      Appearance: He is well-developed.  HENT:     Head: Normocephalic and atraumatic.     Right Ear: Tympanic membrane, ear canal and  external ear normal.     Left Ear: Tympanic membrane, ear canal and external ear normal.     Nose: No rhinorrhea.     Mouth/Throat:     Pharynx: No oropharyngeal exudate or posterior oropharyngeal erythema.  Eyes:     Conjunctiva/sclera: Conjunctivae normal.     Pupils: Pupils are equal, round, and reactive to light.  Neck:     Vascular: No carotid bruit or JVD.  Cardiovascular:     Rate and Rhythm: Normal rate and regular rhythm.     Heart sounds: Normal heart sounds. No murmur heard. Pulmonary:     Effort: Pulmonary effort is normal.     Breath sounds: Normal breath sounds. No wheezing, rhonchi or rales.  Abdominal:     Palpations: Abdomen is soft.     Tenderness: There is no abdominal tenderness.  Musculoskeletal:     Cervical back: Neck supple.     Right lower leg: No edema.     Left lower leg: No edema.  Lymphadenopathy:     Cervical: No cervical adenopathy.  Skin:    General: Skin is warm and dry.     Findings: No rash.  Neurological:     Mental Status: He is alert and oriented to person, place, and time.  Psychiatric:        Mood and Affect: Mood normal.        Behavior: Behavior normal.       Results for orders placed or performed in visit on 06/25/23  POC COVID-19   Collection Time: 06/25/23 10:34 AM  Result Value Ref Range   SARS Coronavirus 2 Ag Negative Negative  POCT Influenza A/B   Collection Time: 06/25/23 10:35 AM  Result Value Ref Range   Influenza A, POC Negative Negative  Influenza B, POC Negative Negative    Assessment & Plan:  Mclain Bradway. is a 52 y.o. male . Acute cough - Plan: POC COVID-19, POCT Influenza A/B Upper respiratory tract infection, unspecified type  -Suspected viral illness, reassuring COVID and flu testing in office, afebrile.  Reassuring exam.  Continue symptomatic care with Mucinex, fluids, rest with RTC precautions.  Vitamin D deficiency - Plan: Vitamin D (25 hydroxy)  -No current supplementation, check labs and then  prescription strength or over-the-counter vitamin D supplement depending on results.  Essential hypertension - Plan: Comprehensive metabolic panel, Lipid panel  -Elevated in office, normal at home, just took meds today.  Home monitoring with RTC precautions given.  Midline low back pain without sciatica, unspecified chronicity Degeneration of intervertebral disc of lumbar region with discogenic back pain  -Improving with physical therapy, continue same and follow-up with orthopedics as planned, RTC precautions.  Prediabetes - Plan: Hemoglobin A1c  -Some walking but exercise somewhat limited by back pain.  Check labs and adjust plan accordingly.  No orders of the defined types were placed in this encounter.  Patient Instructions  You likely have a viral infection, and that should improve in the next few days.  COVID and flu test in the office were normal/negative.  Okay to use over-the-counter Mucinex, drink plenty fluids and rest.  Follow-up if any fevers, shortness of breath or worsening symptoms.  I do not expect that to occur.  Depending on vitamin D level I will send in a supplement or we can discuss over-the-counter supplement.  Continue to monitor blood pressure and if any persistent elevated readings at home, let me know and we can adjust your medications.  Keep follow-up with orthopedics regarding your back pain, glad to hear that it is improving.  Hopefully physical therapy will continue to improve symptoms.  Thank you for coming in today and take care!    Signed,   Meredith Staggers, MD [facility], [facility] 06/25/23 10:55 AM

## 2023-06-25 NOTE — Patient Instructions (Signed)
You likely have a viral infection, and that should improve in the next few days.  COVID and flu test in the office were normal/negative.  Okay to use over-the-counter Mucinex, drink plenty fluids and rest.  Follow-up if any fevers, shortness of breath or worsening symptoms.  I do not expect that to occur.  Depending on vitamin D level I will send in a supplement or we can discuss over-the-counter supplement.  Continue to monitor blood pressure and if any persistent elevated readings at home, let me know and we can adjust your medications.  Keep follow-up with orthopedics regarding your back pain, glad to hear that it is improving.  Hopefully physical therapy will continue to improve symptoms.  Thank you for coming in today and take care!

## 2023-07-01 ENCOUNTER — Other Ambulatory Visit: Payer: Self-pay | Admitting: Family Medicine

## 2023-07-01 DIAGNOSIS — E559 Vitamin D deficiency, unspecified: Secondary | ICD-10-CM

## 2023-07-01 MED ORDER — VITAMIN D (ERGOCALCIFEROL) 1.25 MG (50000 UNIT) PO CAPS: 50000.0000 [IU] | ORAL_CAPSULE | ORAL | 1 refills | Status: DC

## 2023-07-01 NOTE — Progress Notes (Signed)
See labs.  Vitamin D ordered.

## 2023-07-03 ENCOUNTER — Encounter: Payer: Self-pay | Admitting: Rehabilitative and Restorative Service Providers"

## 2023-07-03 ENCOUNTER — Ambulatory Visit: Payer: Medicare Other | Admitting: Rehabilitative and Restorative Service Providers"

## 2023-07-03 DIAGNOSIS — R262 Difficulty in walking, not elsewhere classified: Secondary | ICD-10-CM | POA: Diagnosis not present

## 2023-07-03 DIAGNOSIS — M6281 Muscle weakness (generalized): Secondary | ICD-10-CM | POA: Diagnosis not present

## 2023-07-03 DIAGNOSIS — M5459 Other low back pain: Secondary | ICD-10-CM | POA: Diagnosis not present

## 2023-07-03 NOTE — Therapy (Signed)
 OUTPATIENT PHYSICAL THERAPY TREATMENT   Patient Name: Wael Maestas. MRN: [id number] DOB:June 28, 1971, 52 y.o., male Today's Date: 07/03/2023  END OF SESSION:  PT End of Session - 07/03/23 1054     Visit Number 2    Number of Visits 12    Date for PT Re-Evaluation 08/15/23    Authorization Type [employer] MCR    PT Start Time 1053    PT Stop Time 1132    PT Time Calculation (min) 39 min    Activity Tolerance Patient limited by pain    Behavior During Therapy [facility] for tasks assessed/performed              Past Medical History:  Diagnosis Date   Allergy     trees/ grasses/ animals/dust/mold   Biliary dyskinesia    Colloid thyroid  nodule    COVID-19 01/2021   Crohn's disease (HCC)    Stomach, terminal ileum, cecum   Dyspnea    due to nasal /sinus congestion   Gastric ulcer    antral   GERD (gastroesophageal reflux disease)    History of kidney stones    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   History of migraine headaches    HTN (hypertension)    Kidney stone 09/20/2012   Migraine    Obesity    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    Pneumonia 02/27/2014   ED   Postsurgical dumping syndrome 02/14/2019   Sleep apnea    has cpap- does not wear    Past Surgical History:  Procedure Laterality Date   BALLOON DILATION N/A 12/06/2017   Procedure: BALLOON DILATION;  Surgeon: Avram Lupita BRAVO, MD;  Location: [facility];  Service: Endoscopy;  Laterality: N/A;   BIOPSY  06/21/2018   Procedure: BIOPSY;  Surgeon: Avram Lupita BRAVO, MD;  Location: [facility];  Service: Endoscopy;;   CHOLECYSTECTOMY  2011   [place]   CIRCUMCISION N/A 12/09/2018   Procedure: CIRCUMCISION ADULT;  Surgeon: Carolee Sherwood JONETTA DOUGLAS, MD;  Location: [facility];  Service: Urology;  Laterality: N/A;   COLONOSCOPY  07/26/11   Crohn's colitis, ileitis   ESOPHAGOGASTRODUODENOSCOPY  05/04/11, 07/26/11   granulomatous gastritis - Crohn's   ESOPHAGOGASTRODUODENOSCOPY (EGD) WITH PROPOFOL  N/A  03/28/2017   Procedure: ESOPHAGOGASTRODUODENOSCOPY (EGD) WITH PROPOFOL   with balloon dilation of duodenum;  Surgeon: Leigh Elspeth SQUIBB, MD;  Location: [facility];  Service: Gastroenterology;  Laterality: N/A;   ESOPHAGOGASTRODUODENOSCOPY (EGD) WITH PROPOFOL  N/A 12/06/2017   Procedure: ESOPHAGOGASTRODUODENOSCOPY (EGD) WITH PROPOFOL ;  Surgeon: Avram Lupita BRAVO, MD;  Location: [facility];  Service: Endoscopy;  Laterality: N/A;   ESOPHAGOGASTRODUODENOSCOPY (EGD) WITH PROPOFOL  N/A 06/21/2018   Procedure: ESOPHAGOGASTRODUODENOSCOPY (EGD) WITH PROPOFOL ;  Surgeon: Avram Lupita BRAVO, MD;  Location: [facility];  Service: Endoscopy;  Laterality: N/A;   ETHMOIDECTOMY N/A 11/27/2018   Procedure: ENDOSCOPIC TOTAL ETHMOIDECTOMY;  Surgeon: Karis Clunes, MD;  Location: [facility];  Service: ENT;  Laterality: N/A;   FLEXIBLE SIGMOIDOSCOPY     GASTROJEJUNOSTOMY  01/2018   MULTIPLE TOOTH EXTRACTIONS  2005   NASAL SEPTOPLASTY W/ TURBINOPLASTY Bilateral 11/27/2018   Procedure: NASAL SEPTOPLASTY WITH BILATERAL TURBINATE REDUCTION;  Surgeon: Karis Clunes, MD;  Location: [facility];  Service: ENT;  Laterality: Bilateral;   SHOULDER SURGERY     right   SINUS ENDO WITH FUSION N/A 11/27/2018   Procedure: SINUS ENDO WITH FUSION;  Surgeon: Karis Clunes, MD;  Location: [facility];  Service: ENT;  Laterality: N/A;   UPPER GASTROINTESTINAL ENDOSCOPY  VASECTOMY     bilateral w/lysis of penile adhesions   Patient Active Problem List   Diagnosis Date Noted   Low back pain 06/05/2023   Seasonal and perennial allergic rhinitis 09/07/2022   Allergic conjunctivitis of both eyes 09/07/2022   Feeding problems 12/02/2021   Abdominal migraine, not intractable ? 03/21/2019   Postsurgical dumping syndrome 02/14/2019   S/P functional endoscopic sinus surgery 11/27/2018   Abdominal pain, epigastric    Angioedema 04/19/2018   Intractable vomiting with nausea    Cigarette nicotine dependence without complication 05/15/2016   Shifting sleep-work schedule,  affecting sleep 05/15/2016   Sleep deprivation 05/15/2016   Sleep related hypoventilation in conditions classified elsewhere 05/15/2016   Sleep related headaches 05/15/2016   Morbid obesity (HCC) 09/19/2013   Diarrhea 07/10/2013   Colloid thyroid  nodule 04/04/2013   Elevated blood pressure reading in office with diagnosis of hypertension 11/26/2012   Gastroparesis??? 12/08/2011   Common migraine without aura 12/08/2011   Gastroduodenal Crohn's disease (HCC) 05/17/2011   GERD (gastroesophageal reflux disease) 04/21/2011    PCP: Levora Reyes SAUNDERS, MD   REFERRING PROVIDER: Persons, Ronal Dragon, [place]   REFERRING DIAG:    M54.40 (ICD-10-CM) - Acute bilateral low back pain with sciatica, sciatica laterality unspecified    Rationale for Evaluation and Treatment: Rehabilitation  THERAPY DIAG:  Other low back pain  Muscle weakness (generalized)  Difficulty in walking, not elsewhere classified  ONSET DATE: 6 month history of back pain  SUBJECTIVE:                                                                                                                                                                                           SUBJECTIVE STATEMENT: Pt indicated feeling a little better.  Stretching was helpful.  Hasn't been able to walk due to being sick.   Pt indicated a 3/10 upon arrival.  Pt indicated when lying on Rt side this morning was 8/10.   PERTINENT HISTORY:  Chrohns disease, obesity, sleep apnea  PAIN:  NPRS scale: 3/10 upon arrival.  Pain location:center of low back  Pain description: constant, achy, sharp Aggravating factors: putting on socks and shoes. Standing, walking, sitting straight up Relieving factors: leaning back   PRECAUTIONS: None  RED FLAGS: None   WEIGHT BEARING RESTRICTIONS: No  FALLS:  Has patient fallen in last 6 months? Yes. Number of falls 1, slipped on last step on clothes  OCCUPATION: disabillity  PLOF: Independent with basic  ADLS  PATIENT GOALS: reduce the pain, loose weight  NEXT MD VISIT: 07/31/23  OBJECTIVE:  Note: Objective measures were completed at Evaluation unless otherwise noted.  DIAGNOSTIC FINDINGS:  06/20/2023 review:  XR IMPRESSION: Mild bilateral hip osteoarthritis. Multilevel degenerative disc disease, most prominent at L3-L4 and L4-L5. Mild thoracic degenerative spurring  PATIENT SURVEYS:  Eval FOTO 26% functional, goal is 47%  COGNITION: 06/20/2023 Overall cognitive status: Within functional limits for tasks assessed     SENSATION: 06/20/2023 [facility]  MUSCLE LENGTH: 06/20/2023 Hamstrings and paraspinals tight bilat  LUMBAR ROM:   AROM 06/20/2023 07/03/2023  Flexion 50% and pain worse   Extension 75% and pain improves 75% WFL  Right lateral flexion    Left lateral flexion    Right rotation 50%    Left rotation 50% and pain    (Blank rows = not tested)  LOWER EXTREMITY MMT:    MMT Right 06/20/2023 Left 06/20/2023  Hip flexion 4 4  Hip extension    Hip abduction 4 4  Hip adduction    Hip internal rotation    Hip external rotation    Knee flexion 5 5  Knee extension 5 5  Ankle dorsiflexion    Ankle plantarflexion    Ankle inversion    Ankle eversion     (Blank rows = not tested)  LUMBAR SPECIAL TESTS:  06/20/2023 Slump test: Negative  FUNCTIONAL TESTS:  06/20/2023 Eval: Timed up and go (TUG): 16:16  GAIT: 06/20/2023 Comments: antalgic gait with wide BOS, slow gait velocity                     TODAY'S TREATMENT:                                                     DATE: 07/03/2023 Manual Percussive device low level to mid and lower back paraspinals, QL bilateral.  Discussed and education for home use if desired.   Therex Supine lumbar trunk rotation 15 sec x 3 bilateral Supine bridge 2 x 10 2-3 sec hold  Seated pball press down ab activation 10 sec hold x 10 submax Seated multifidi isometric UE lift into table 10 sec hold x 10  Standing lumbar  extension AROM 2 x 5  Nustep Lvl 5 UE/LE for aerobic exercise/mobility 7 mins - stopped due to burning in Lt hip  Discussed benefits for aquatic therapy option.    TODAY'S TREATMENT:                                                     DATE: 06/20/2023 Eval HEP creation and review with demonstration and trial set preformed, see below for details IFC Estim X 15 min to lumbar/thoracic P.S, with intensity turned up to tolerance and  with moist heat    PATIENT EDUCATION: Education details: HEP, PT plan of care, weight loss strategies Person educated: Patient Education method: Explanation, Demonstration, Verbal cues, and Handouts Education comprehension: verbalized understanding and needs further education   HOME EXERCISE PROGRAM: Access Code: [email or web address] URL: [email or web address] Date: 06/20/2023 Prepared by: [name]  Exercises - Standing Lumbar Extension  - 2 x daily - 6 x weekly - 2 sets - 10 reps - 5 hold - Scapular Retraction with Resistance  - 2 x daily - 6 x weekly - 2 sets - 15 reps - Shoulder  extension with resistance - Neutral  - 2 x daily - 6 x weekly - 2 sets - 15 reps - Standing Hip Extension with Counter Support  - 2 x daily - 6 x weekly - 1 sets - 15 reps - Standing 'L' Stretch at Counter  - 2 x daily - 6 x weekly - 1 sets - 10 reps - 5 hold - Supine Lower Trunk Rotation  - 2 x daily - 6 x weekly - 1 sets - 5 reps - 10 sec hold  ASSESSMENT:  CLINICAL IMPRESSION: Good overall recall of existing HEP.  Pt reported TENS units at home so held use of it in clinic today.  Focused on progressive lumbar/hip mobility improvements to help reduce tightness/guarding of lower back.  May continue to benefit from skilled PT services.   Pt to attend aquatic therapy for exercise and learning options for use in [employer] if he gets membership.   OBJECTIVE IMPAIRMENTS: decreased activity tolerance, difficulty walking, decreased balance, decreased endurance, decreased  mobility, decreased ROM, decreased strength, impaired flexibility, impaired LE use, postural dysfunction, and pain.  ACTIVITY LIMITATIONS: bending, lifting, carry, locomotion, cleaning, community activity,   PERSONAL FACTORS: see above PMH are also affecting patient's functional outcome.  REHAB POTENTIAL: Good  CLINICAL DECISION MAKING: Stable/uncomplicated  EVALUATION COMPLEXITY: Low    GOALS: Short term PT Goals Target date: 07/18/2023   Pt will be I and compliant with HEP. Baseline:  Goal status: New Pt will decrease pain by 25% overall Baseline: Goal status: New  Long term PT goals Target date:08/15/2023   Pt will improve lumbar AROM to [facility] to improve functional mobility Baseline: Goal status: New Pt will improve  hip/knee strength to at least 4+/5 MMT to improve functional strength Baseline: Goal status: New Pt will improve FOTO to at least 47 % functional to show improved function Baseline: Goal status: New Pt will reduce pain by overall 50% so less than 5/10 overall with usual activity Baseline:9.5/10 Goal status: Ne Pt will improve TUG time to less than 13 seconds to show improved gait speed and sit to stand ability.  Baseline: Goal status: New  PLAN: PT FREQUENCY: 1-2 times per week   PT DURATION: 6-8 weeks  PLANNED INTERVENTIONS (unless contraindicated): aquatic PT, Canalith repositioning, cryotherapy, Electrical stimulation, Iontophoresis with 4 mg/ml dexamethasome, Moist heat, traction, Ultrasound, gait training, Therapeutic exercise, balance training, neuromuscular re-education, patient/family education, prosthetic training, manual techniques, passive ROM, dry needling, taping, vasopnuematic device, vestibular, spinal manipulations, joint manipulations [id number]-Therapeutic exercises, [id number]- Therapeutic activity, [id number]- Neuromuscular re-education, [id number]- Self Care, [id number]- Manual therapy, [id number]- Gait training, [id number]- Aquatic Therapy, [id number]- Electrical  stimulation (manual), and Dry Needling  PLAN FOR NEXT SESSION: aquatic therapy next visit.   Ozell Silvan, PT, DPT, OCS, ATC 07/03/23  12:02 PM     Date of referral: 06/05/23 Referring provider: Persons, Ronal Dragon, [place]  Referring diagnosis? M54.40 (ICD-10-CM) - Acute bilateral low back pain with sciatica, sciatica laterality unspecified Treatment diagnosis? (if different than referring diagnosis) m54.59  What was this (referring dx) caused by? Ongoing Issue and Arthritis  Lysle of Condition: Chronic (continuous duration > 3 months)   Laterality: Both  Current Functional Measure Score: FOTO 26% functional intake  Objective measurements identify impairments when they are compared to normal values, the uninvolved extremity, and prior level of function.  [x]  Yes  []  No  Objective assessment of functional ability: Severe functional limitations   Briefly describe symptoms: chronic back pain that limits his ability for standing and walking  How did symptoms start: unspecified, insidious onset  Average pain intensity:  Last 24 hours: 9.5/10  Past week: 03/07/09  How often does the pt experience symptoms? Constantly  How much have the symptoms interfered with usual daily activities? Extremely  How has condition changed since care began at this facility? NA - initial visit  In general, how is the patients overall health? Poor   BACK PAIN (STarT Back Screening Tool) No, did FOTO instead

## 2023-07-05 ENCOUNTER — Other Ambulatory Visit: Payer: Self-pay

## 2023-07-05 MED ORDER — CYANOCOBALAMIN 1000 MCG/ML IJ SOLN: 1000.0000 ug | INTRAMUSCULAR | 1 refills | Status: AC

## 2023-07-05 NOTE — Telephone Encounter (Signed)
 Received fax from [facility] and I called to confirm with Abran this new pharmacy. His insurance is making him use mail order. I sent in a refill for his B12 monthly injections. He said he needs the needles and syringes to give it so I put that in the comments. He is up to date on his appointments.

## 2023-07-06 ENCOUNTER — Encounter: Payer: Self-pay | Admitting: Physical Therapy

## 2023-07-06 ENCOUNTER — Ambulatory Visit (HOSPITAL_BASED_OUTPATIENT_CLINIC_OR_DEPARTMENT_OTHER): Payer: Medicare Other | Admitting: Physical Therapy

## 2023-07-06 DIAGNOSIS — R262 Difficulty in walking, not elsewhere classified: Secondary | ICD-10-CM | POA: Diagnosis not present

## 2023-07-06 DIAGNOSIS — M6281 Muscle weakness (generalized): Secondary | ICD-10-CM

## 2023-07-06 DIAGNOSIS — M5459 Other low back pain: Secondary | ICD-10-CM

## 2023-07-06 NOTE — Therapy (Signed)
 OUTPATIENT PHYSICAL THERAPY TREATMENT   Patient Name: Craig Guzman. MRN: [id number] DOB:December 15, 1970, 53 y.o., male Today's Date: 07/06/2023  END OF SESSION:  PT End of Session - 07/06/23 0939     Visit Number 3    Number of Visits 12    Date for PT Re-Evaluation 08/15/23    Authorization Type [employer] MCR    PT Start Time 0935    PT Stop Time 1015    PT Time Calculation (min) 40 min    Activity Tolerance Patient limited by pain    Behavior During Therapy [facility] for tasks assessed/performed              Past Medical History:  Diagnosis Date   Allergy     trees/ grasses/ animals/dust/mold   Biliary dyskinesia    Colloid thyroid  nodule    COVID-19 01/2021   Crohn's disease (HCC)    Stomach, terminal ileum, cecum   Dyspnea    due to nasal /sinus congestion   Gastric ulcer    antral   GERD (gastroesophageal reflux disease)    History of kidney stones    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   History of migraine headaches    HTN (hypertension)    Kidney stone 09/20/2012   Migraine    Obesity    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    Pneumonia 02/27/2014   ED   Postsurgical dumping syndrome 02/14/2019   Sleep apnea    has cpap- does not wear    Past Surgical History:  Procedure Laterality Date   BALLOON DILATION N/A 12/06/2017   Procedure: BALLOON DILATION;  Surgeon: Avram Lupita BRAVO, MD;  Location: [facility];  Service: Endoscopy;  Laterality: N/A;   BIOPSY  06/21/2018   Procedure: BIOPSY;  Surgeon: Avram Lupita BRAVO, MD;  Location: [facility];  Service: Endoscopy;;   CHOLECYSTECTOMY  2011   [place]   CIRCUMCISION N/A 12/09/2018   Procedure: CIRCUMCISION ADULT;  Surgeon: Carolee Sherwood JONETTA DOUGLAS, MD;  Location: [facility];  Service: Urology;  Laterality: N/A;   COLONOSCOPY  07/26/11   Crohn's colitis, ileitis   ESOPHAGOGASTRODUODENOSCOPY  05/04/11, 07/26/11   granulomatous gastritis - Crohn's   ESOPHAGOGASTRODUODENOSCOPY (EGD) WITH PROPOFOL  N/A  03/28/2017   Procedure: ESOPHAGOGASTRODUODENOSCOPY (EGD) WITH PROPOFOL   with balloon dilation of duodenum;  Surgeon: Leigh Elspeth SQUIBB, MD;  Location: [facility];  Service: Gastroenterology;  Laterality: N/A;   ESOPHAGOGASTRODUODENOSCOPY (EGD) WITH PROPOFOL  N/A 12/06/2017   Procedure: ESOPHAGOGASTRODUODENOSCOPY (EGD) WITH PROPOFOL ;  Surgeon: Avram Lupita BRAVO, MD;  Location: [facility];  Service: Endoscopy;  Laterality: N/A;   ESOPHAGOGASTRODUODENOSCOPY (EGD) WITH PROPOFOL  N/A 06/21/2018   Procedure: ESOPHAGOGASTRODUODENOSCOPY (EGD) WITH PROPOFOL ;  Surgeon: Avram Lupita BRAVO, MD;  Location: [facility];  Service: Endoscopy;  Laterality: N/A;   ETHMOIDECTOMY N/A 11/27/2018   Procedure: ENDOSCOPIC TOTAL ETHMOIDECTOMY;  Surgeon: Karis Clunes, MD;  Location: [facility];  Service: ENT;  Laterality: N/A;   FLEXIBLE SIGMOIDOSCOPY     GASTROJEJUNOSTOMY  01/2018   MULTIPLE TOOTH EXTRACTIONS  2005   NASAL SEPTOPLASTY W/ TURBINOPLASTY Bilateral 11/27/2018   Procedure: NASAL SEPTOPLASTY WITH BILATERAL TURBINATE REDUCTION;  Surgeon: Karis Clunes, MD;  Location: [facility];  Service: ENT;  Laterality: Bilateral;   SHOULDER SURGERY     right   SINUS ENDO WITH FUSION N/A 11/27/2018   Procedure: SINUS ENDO WITH FUSION;  Surgeon: Karis Clunes, MD;  Location: [facility];  Service: ENT;  Laterality: N/A;   UPPER GASTROINTESTINAL ENDOSCOPY  VASECTOMY     bilateral w/lysis of penile adhesions   Patient Active Problem List   Diagnosis Date Noted   Low back pain 06/05/2023   Seasonal and perennial allergic rhinitis 09/07/2022   Allergic conjunctivitis of both eyes 09/07/2022   Feeding problems 12/02/2021   Abdominal migraine, not intractable ? 03/21/2019   Postsurgical dumping syndrome 02/14/2019   S/P functional endoscopic sinus surgery 11/27/2018   Abdominal pain, epigastric    Angioedema 04/19/2018   Intractable vomiting with nausea    Cigarette nicotine dependence without complication 05/15/2016   Shifting sleep-work schedule,  affecting sleep 05/15/2016   Sleep deprivation 05/15/2016   Sleep related hypoventilation in conditions classified elsewhere 05/15/2016   Sleep related headaches 05/15/2016   Morbid obesity (HCC) 09/19/2013   Diarrhea 07/10/2013   Colloid thyroid  nodule 04/04/2013   Elevated blood pressure reading in office with diagnosis of hypertension 11/26/2012   Gastroparesis??? 12/08/2011   Common migraine without aura 12/08/2011   Gastroduodenal Crohn's disease (HCC) 05/17/2011   GERD (gastroesophageal reflux disease) 04/21/2011    PCP: Levora Reyes SAUNDERS, MD   REFERRING PROVIDER: Persons, Ronal Dragon, [place]   REFERRING DIAG:    M54.40 (ICD-10-CM) - Acute bilateral low back pain with sciatica, sciatica laterality unspecified    Rationale for Evaluation and Treatment: Rehabilitation  THERAPY DIAG:  Other low back pain  Muscle weakness (generalized)  Difficulty in walking, not elsewhere classified  ONSET DATE: 6 month history of back pain  SUBJECTIVE:                                                                                                                                                                                           SUBJECTIVE STATEMENT: Pt indicated feeling a little better.  Stretching was helpful.  Hasn't been able to walk due to being sick.   Pt indicated a 3/10 upon arrival.  Pt indicated when lying on Rt side this morning was 8/10.   PERTINENT HISTORY:  Chrohns disease, obesity, sleep apnea  PAIN:  NPRS scale: 3/10 upon arrival.  Pain location:center of low back  Pain description: constant, achy, sharp Aggravating factors: putting on socks and shoes. Standing, walking, sitting straight up Relieving factors: leaning back   PRECAUTIONS: None  RED FLAGS: None   WEIGHT BEARING RESTRICTIONS: No  FALLS:  Has patient fallen in last 6 months? Yes. Number of falls 1, slipped on last step on clothes  OCCUPATION: disabillity  PLOF: Independent with basic  ADLS  PATIENT GOALS: reduce the pain, loose weight  NEXT MD VISIT: 07/31/23  OBJECTIVE:  Note: Objective measures were completed at Evaluation unless otherwise noted.  DIAGNOSTIC FINDINGS:  06/20/2023 review:  XR IMPRESSION: Mild bilateral hip osteoarthritis. Multilevel degenerative disc disease, most prominent at L3-L4 and L4-L5. Mild thoracic degenerative spurring  PATIENT SURVEYS:  Eval FOTO 26% functional, goal is 47%  COGNITION: 06/20/2023 Overall cognitive status: Within functional limits for tasks assessed     SENSATION: 06/20/2023 [facility]  MUSCLE LENGTH: 06/20/2023 Hamstrings and paraspinals tight bilat  LUMBAR ROM:   AROM 06/20/2023 07/03/2023  Flexion 50% and pain worse   Extension 75% and pain improves 75% WFL  Right lateral flexion    Left lateral flexion    Right rotation 50%    Left rotation 50% and pain    (Blank rows = not tested)  LOWER EXTREMITY MMT:    MMT Right 06/20/2023 Left 06/20/2023  Hip flexion 4 4  Hip extension    Hip abduction 4 4  Hip adduction    Hip internal rotation    Hip external rotation    Knee flexion 5 5  Knee extension 5 5  Ankle dorsiflexion    Ankle plantarflexion    Ankle inversion    Ankle eversion     (Blank rows = not tested)  LUMBAR SPECIAL TESTS:  06/20/2023 Slump test: Negative  FUNCTIONAL TESTS:  06/20/2023 Eval: Timed up and go (TUG): 16:16  GAIT: 06/20/2023 Comments: antalgic gait with wide BOS, slow gait velocity                     TODAY'S TREATMENT:                                                     DATE:  DATE: 07/06/23 Pt seen for aquatic therapy today.  Treatment took place in water 3.5-4.75 ft in depth at the [facility] pool. Temp of water was 91.  Pt entered/exited the pool via stairs with hand rail.   Pt requires the buoyancy and hydrostatic pressure of water for support, and to offload joints by unweighting joint load by at least 50 % in navel deep water and by at least  75-80% in chest to neck deep water.  Viscosity of the water is needed for resistance of strengthening. Water current perturbations provides challenge to standing balance requiring increased core activation. Aquatic PT exercises Forward walking and backward walking 4 round trips, no UE support Sidestepping 4 round trips Tandem walk 3 round trips with UE support of noodle, then 1 trip without noodle Lumbar flexion L stretch 5 sec X 10 holding at pool wall Leg swings hip abd/add X20 bilat with UE support with UE support Leg swings hip flexion/extension X 20 bilat Push/pull with kickboard 2 X 15 in lunge stance  Shoulder extension with kickboard X 20 Shoulder adduction/abduction X 15 bilat with water dumbells Shoulder push/pull X 20 bilat with water dumbells Stir the pot with single water dumbell X 15 Clockwise, X 15 CCW. Monster walks 3 round trips March walk 3 round trips Seated on noodle bicycle X 5 min for spinal decompression   07/03/2023 Manual Percussive device low level to mid and lower back paraspinals, QL bilateral.  Discussed and education for home use if desired.   Therex Supine lumbar trunk rotation 15 sec x 3 bilateral Supine bridge 2 x 10 2-3 sec hold  Seated pball press down ab activation 10 sec hold x 10 submax  Seated multifidi isometric UE lift into table 10 sec hold x 10  Standing lumbar extension AROM 2 x 5  Nustep Lvl 5 UE/LE for aerobic exercise/mobility 7 mins - stopped due to burning in Lt hip  Discussed benefits for aquatic therapy option.    TODAY'S TREATMENT:                                                     DATE: 06/20/2023 Eval HEP creation and review with demonstration and trial set preformed, see below for details IFC Estim X 15 min to lumbar/thoracic P.S, with intensity turned up to tolerance and  with moist heat    PATIENT EDUCATION: Education details: HEP, PT plan of care, weight loss strategies Person educated: Patient Education method:  Explanation, Demonstration, Verbal cues, and Handouts Education comprehension: verbalized understanding and needs further education   HOME EXERCISE PROGRAM: Access Code: [email or web address] URL: [email or web address] Date: 06/20/2023 Prepared by: [name]  Exercises - Standing Lumbar Extension  - 2 x daily - 6 x weekly - 2 sets - 10 reps - 5 hold - Scapular Retraction with Resistance  - 2 x daily - 6 x weekly - 2 sets - 15 reps - Shoulder extension with resistance - Neutral  - 2 x daily - 6 x weekly - 2 sets - 15 reps - Standing Hip Extension with Counter Support  - 2 x daily - 6 x weekly - 1 sets - 15 reps - Standing 'L' Stretch at Counter  - 2 x daily - 6 x weekly - 1 sets - 10 reps - 5 hold - Supine Lower Trunk Rotation  - 2 x daily - 6 x weekly - 1 sets - 5 reps - 10 sec hold  ASSESSMENT:  CLINICAL IMPRESSION: He had first aquatic PT session today with good overall tolerance in session. We will assess his longer term response and monitor for any soreness.   OBJECTIVE IMPAIRMENTS: decreased activity tolerance, difficulty walking, decreased balance, decreased endurance, decreased mobility, decreased ROM, decreased strength, impaired flexibility, impaired LE use, postural dysfunction, and pain.  ACTIVITY LIMITATIONS: bending, lifting, carry, locomotion, cleaning, community activity,   PERSONAL FACTORS: see above PMH are also affecting patient's functional outcome.  REHAB POTENTIAL: Good  CLINICAL DECISION MAKING: Stable/uncomplicated  EVALUATION COMPLEXITY: Low    GOALS: Short term PT Goals Target date: 07/18/2023   Pt will be I and compliant with HEP. Baseline:  Goal status: New Pt will decrease pain by 25% overall Baseline: Goal status: New  Long term PT goals Target date:08/15/2023   Pt will improve lumbar AROM to [facility] to improve functional mobility Baseline: Goal status: New Pt will improve  hip/knee strength to at least 4+/5 MMT to improve functional  strength Baseline: Goal status: New Pt will improve FOTO to at least 47 % functional to show improved function Baseline: Goal status: New Pt will reduce pain by overall 50% so less than 5/10 overall with usual activity Baseline:9.5/10 Goal status: Ne Pt will improve TUG time to less than 13 seconds to show improved gait speed and sit to stand ability.  Baseline: Goal status: New  PLAN: PT FREQUENCY: 1-2 times per week   PT DURATION: 6-8 weeks  PLANNED INTERVENTIONS (unless contraindicated): aquatic PT, Canalith repositioning, cryotherapy, Electrical stimulation, Iontophoresis with 4 mg/ml dexamethasome, Moist heat, traction, Ultrasound, gait training, Therapeutic  exercise, balance training, neuromuscular re-education, patient/family education, prosthetic training, manual techniques, passive ROM, dry needling, taping, vasopnuematic device, vestibular, spinal manipulations, joint manipulations [id number]-Therapeutic exercises, 97530- Therapeutic activity, 97112- Neuromuscular re-education, 97535- Self Care, [id number]- Manual therapy, [phone]- Gait training, [phone]- Aquatic Therapy, [phone]- Electrical stimulation (manual), and Dry Needling  PLAN FOR NEXT SESSION: assess response to aquatic PT, if he joins gym with pool then get him aquatic HEP.   [name], PT, DPT 07/06/23 10:03 AM     Date of referral: 06/05/23 Referring provider: Persons, Ronal Dragon, [place]  Referring diagnosis? M54.40 (ICD-10-CM) - Acute bilateral low back pain with sciatica, sciatica laterality unspecified Treatment diagnosis? (if different than referring diagnosis) m54.59  What was this (referring dx) caused by? Ongoing Issue and Arthritis  Lysle of Condition: Chronic (continuous duration > 3 months)   Laterality: Both  Current Functional Measure Score: FOTO 26% functional intake  Objective measurements identify impairments when they are compared to normal values, the uninvolved extremity, and prior level of  function.  [x]  Yes  []  No  Objective assessment of functional ability: Severe functional limitations   Briefly describe symptoms: chronic back pain that limits his ability for standing and walking  How did symptoms start: unspecified, insidious onset  Average pain intensity:  Last 24 hours: 9.5/10  Past week: 03/07/09  How often does the pt experience symptoms? Constantly  How much have the symptoms interfered with usual daily activities? Extremely  How has condition changed since care began at this facility? NA - initial visit  In general, how is the patients overall health? Poor   BACK PAIN (STarT Back Screening Tool) No, did FOTO instead

## 2023-07-10 ENCOUNTER — Other Ambulatory Visit: Payer: Self-pay

## 2023-07-10 ENCOUNTER — Ambulatory Visit: Payer: Medicare Other | Admitting: Physical Therapy

## 2023-07-10 ENCOUNTER — Encounter: Payer: Self-pay | Admitting: Physical Therapy

## 2023-07-10 DIAGNOSIS — M5459 Other low back pain: Secondary | ICD-10-CM

## 2023-07-10 DIAGNOSIS — R262 Difficulty in walking, not elsewhere classified: Secondary | ICD-10-CM | POA: Diagnosis not present

## 2023-07-10 DIAGNOSIS — M6281 Muscle weakness (generalized): Secondary | ICD-10-CM

## 2023-07-10 DIAGNOSIS — R351 Nocturia: Secondary | ICD-10-CM

## 2023-07-10 DIAGNOSIS — I10 Essential (primary) hypertension: Secondary | ICD-10-CM

## 2023-07-10 MED ORDER — OLMESARTAN MEDOXOMIL 20 MG PO TABS: 20.0000 mg | ORAL_TABLET | Freq: Every day | ORAL | 2 refills | Status: DC

## 2023-07-10 MED ORDER — DOXAZOSIN MESYLATE 8 MG PO TABS
8.0000 mg | ORAL_TABLET | Freq: Every day | ORAL | 1 refills | Status: DC
Start: 2023-07-10 — End: 2023-07-17

## 2023-07-10 MED ORDER — VERAPAMIL HCL ER 180 MG PO TBCR: 360.0000 mg | EXTENDED_RELEASE_TABLET | Freq: Every day | ORAL | 0 refills | Status: DC

## 2023-07-10 NOTE — Therapy (Signed)
 OUTPATIENT PHYSICAL THERAPY TREATMENT   Patient Name: Craig Guzman. MRN: [id number] DOB:1971-02-15, 53 y.o., male Today's Date: 07/10/2023  END OF SESSION:  PT End of Session - 07/10/23 1151     Visit Number 4    Number of Visits 12    Date for PT Re-Evaluation 08/15/23    Authorization Type [employer] MCR    PT Start Time 1142    PT Stop Time 1222    PT Time Calculation (min) 40 min    Activity Tolerance Patient tolerated treatment well    Behavior During Therapy WFL for tasks assessed/performed               Past Medical History:  Diagnosis Date   Allergy     trees/ grasses/ animals/dust/mold   Biliary dyskinesia    Colloid thyroid  nodule    COVID-19 01/2021   Crohn's disease (HCC)    Stomach, terminal ileum, cecum   Dyspnea    due to nasal /sinus congestion   Gastric ulcer    antral   GERD (gastroesophageal reflux disease)    History of kidney stones    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   History of migraine headaches    HTN (hypertension)    Kidney stone 09/20/2012   Migraine    Obesity    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    Pneumonia 02/27/2014   ED   Postsurgical dumping syndrome 02/14/2019   Sleep apnea    has cpap- does not wear    Past Surgical History:  Procedure Laterality Date   BALLOON DILATION N/A 12/06/2017   Procedure: BALLOON DILATION;  Surgeon: Avram Lupita BRAVO, MD;  Location: [facility];  Service: Endoscopy;  Laterality: N/A;   BIOPSY  06/21/2018   Procedure: BIOPSY;  Surgeon: Avram Lupita BRAVO, MD;  Location: [facility];  Service: Endoscopy;;   CHOLECYSTECTOMY  2011   [facility]   CIRCUMCISION N/A 12/09/2018   Procedure: CIRCUMCISION ADULT;  Surgeon: Carolee Sherwood JONETTA DOUGLAS, MD;  Location: [facility];  Service: Urology;  Laterality: N/A;   COLONOSCOPY  07/26/11   Crohn's colitis, ileitis   ESOPHAGOGASTRODUODENOSCOPY  05/04/11, 07/26/11   granulomatous gastritis - Crohn's   ESOPHAGOGASTRODUODENOSCOPY (EGD) WITH PROPOFOL   N/A 03/28/2017   Procedure: ESOPHAGOGASTRODUODENOSCOPY (EGD) WITH PROPOFOL   with balloon dilation of duodenum;  Surgeon: Leigh Elspeth SQUIBB, MD;  Location: [facility];  Service: Gastroenterology;  Laterality: N/A;   ESOPHAGOGASTRODUODENOSCOPY (EGD) WITH PROPOFOL  N/A 12/06/2017   Procedure: ESOPHAGOGASTRODUODENOSCOPY (EGD) WITH PROPOFOL ;  Surgeon: Avram Lupita BRAVO, MD;  Location: [facility];  Service: Endoscopy;  Laterality: N/A;   ESOPHAGOGASTRODUODENOSCOPY (EGD) WITH PROPOFOL  N/A 06/21/2018   Procedure: ESOPHAGOGASTRODUODENOSCOPY (EGD) WITH PROPOFOL ;  Surgeon: Avram Lupita BRAVO, MD;  Location: [facility];  Service: Endoscopy;  Laterality: N/A;   ETHMOIDECTOMY N/A 11/27/2018   Procedure: ENDOSCOPIC TOTAL ETHMOIDECTOMY;  Surgeon: Karis Clunes, MD;  Location: [facility];  Service: ENT;  Laterality: N/A;   FLEXIBLE SIGMOIDOSCOPY     GASTROJEJUNOSTOMY  01/2018   MULTIPLE TOOTH EXTRACTIONS  2005   NASAL SEPTOPLASTY W/ TURBINOPLASTY Bilateral 11/27/2018   Procedure: NASAL SEPTOPLASTY WITH BILATERAL TURBINATE REDUCTION;  Surgeon: Karis Clunes, MD;  Location: [facility];  Service: ENT;  Laterality: Bilateral;   SHOULDER SURGERY     right   SINUS ENDO WITH FUSION N/A 11/27/2018   Procedure: SINUS ENDO WITH FUSION;  Surgeon: Karis Clunes, MD;  Location: [facility];  Service: ENT;  Laterality: N/A;   UPPER GASTROINTESTINAL ENDOSCOPY  VASECTOMY     bilateral w/lysis of penile adhesions   Patient Active Problem List   Diagnosis Date Noted   Low back pain 06/05/2023   Seasonal and perennial allergic rhinitis 09/07/2022   Allergic conjunctivitis of both eyes 09/07/2022   Feeding problems 12/02/2021   Abdominal migraine, not intractable ? 03/21/2019   Postsurgical dumping syndrome 02/14/2019   S/P functional endoscopic sinus surgery 11/27/2018   Abdominal pain, epigastric    Angioedema 04/19/2018   Intractable vomiting with nausea    Cigarette nicotine dependence without complication 05/15/2016   Shifting sleep-work  schedule, affecting sleep 05/15/2016   Sleep deprivation 05/15/2016   Sleep related hypoventilation in conditions classified elsewhere 05/15/2016   Sleep related headaches 05/15/2016   Morbid obesity (HCC) 09/19/2013   Diarrhea 07/10/2013   Colloid thyroid  nodule 04/04/2013   Elevated blood pressure reading in office with diagnosis of hypertension 11/26/2012   Gastroparesis??? 12/08/2011   Common migraine without aura 12/08/2011   Gastroduodenal Crohn's disease (HCC) 05/17/2011   GERD (gastroesophageal reflux disease) 04/21/2011    PCP: Levora Reyes SAUNDERS, MD   REFERRING PROVIDER: Persons, Ronal Dragon, [place]   REFERRING DIAG:    M54.40 (ICD-10-CM) - Acute bilateral low back pain with sciatica, sciatica laterality unspecified    Rationale for Evaluation and Treatment: Rehabilitation  THERAPY DIAG:  Other low back pain  Muscle weakness (generalized)  Difficulty in walking, not elsewhere classified  ONSET DATE: 6 month history of back pain  SUBJECTIVE:                                                                                                                                                                                           SUBJECTIVE STATEMENT: Pt indicated having some pain in his left hip.   Pt indicated a 3/10 upon arrival PERTINENT HISTORY:  Chrohns disease, obesity, sleep apnea  PAIN:  NPRS scale: 3/10 upon arrival.  Pain location:center of low back  Pain description: constant, achy, sharp Aggravating factors: putting on socks and shoes. Standing, walking, sitting straight up Relieving factors: leaning back   PRECAUTIONS: None  RED FLAGS: None   WEIGHT BEARING RESTRICTIONS: No  FALLS:  Has patient fallen in last 6 months? Yes. Number of falls 1, slipped on last step on clothes  OCCUPATION: disabillity  PLOF: Independent with basic ADLS  PATIENT GOALS: reduce the pain, loose weight  NEXT MD VISIT: 07/31/23  OBJECTIVE:  Note: Objective  measures were completed at Evaluation unless otherwise noted.  DIAGNOSTIC FINDINGS:  06/20/2023 review:  XR IMPRESSION: Mild bilateral hip osteoarthritis. Multilevel degenerative disc disease, most prominent at L3-L4 and L4-L5. Mild thoracic degenerative  spurring  PATIENT SURVEYS:  Eval FOTO 26% functional, goal is 47%  COGNITION: 06/20/2023 Overall cognitive status: Within functional limits for tasks assessed     SENSATION: 06/20/2023 [facility]  MUSCLE LENGTH: 06/20/2023 Hamstrings and paraspinals tight bilat  LUMBAR ROM:   AROM 06/20/2023 07/03/2023  Flexion 50% and pain worse   Extension 75% and pain improves 75% WFL  Right lateral flexion    Left lateral flexion    Right rotation 50%    Left rotation 50% and pain    (Blank rows = not tested)  LOWER EXTREMITY MMT:    MMT Right 06/20/2023 Left 06/20/2023  Hip flexion 4 4  Hip extension    Hip abduction 4 4  Hip adduction    Hip internal rotation    Hip external rotation    Knee flexion 5 5  Knee extension 5 5  Ankle dorsiflexion    Ankle plantarflexion    Ankle inversion    Ankle eversion     (Blank rows = not tested)  LUMBAR SPECIAL TESTS:  06/20/2023 Slump test: Negative  FUNCTIONAL TESTS:  06/20/2023 Eval: Timed up and go (TUG): 16:16  GAIT: 06/20/2023 Comments: antalgic gait with wide BOS, slow gait velocity                     TODAY'S TREATMENT:                                                     DATE:  07/10/2023 Manual Percussive device low level to mid and lower back paraspinals, QL bilateral.  Discussed and education for home use if desired.   Therex Supine lumbar trunk rotation 5 sec x 10 bilateral Supine bridge 2 x 10 2-3 sec hold  Supine clams blue 2X15 Supine SLR 2X5 reps Seated pball press down ab activation 5 sec hold x 10 submax Seated pball isometric hip flexion ab activation 5 sec X 5 bilat Standing lumbar extension AROM 2 x 5  Nustep Lvl 4 UE/LE for aerobic  exercise/mobility 8 mins    DATE: 07/06/23 Pt seen for aquatic therapy today.  Treatment took place in water 3.5-4.75 ft in depth at the [facility] pool. Temp of water was 91.  Pt entered/exited the pool via stairs with hand rail.   Pt requires the buoyancy and hydrostatic pressure of water for support, and to offload joints by unweighting joint load by at least 50 % in navel deep water and by at least 75-80% in chest to neck deep water.  Viscosity of the water is needed for resistance of strengthening. Water current perturbations provides challenge to standing balance requiring increased core activation. Aquatic PT exercises Forward walking and backward walking 4 round trips, no UE support Sidestepping 4 round trips Tandem walk 3 round trips with UE support of noodle, then 1 trip without noodle Lumbar flexion L stretch 5 sec X 10 holding at pool wall Leg swings hip abd/add X20 bilat with UE support with UE support Leg swings hip flexion/extension X 20 bilat Push/pull with kickboard 2 X 15 in lunge stance  Shoulder extension with kickboard X 20 Shoulder adduction/abduction X 15 bilat with water dumbells Shoulder push/pull X 20 bilat with water dumbells Stir the pot with single water dumbell X 15 Clockwise, X 15 CCW. Monster walks 3 round trips March walk  3 round trips Seated on noodle bicycle X 5 min for spinal decompression   07/03/2023 Manual Percussive device low level to mid and lower back paraspinals, QL bilateral.  Discussed and education for home use if desired.   Therex Supine lumbar trunk rotation 15 sec x 3 bilateral Supine bridge 2 x 10 2-3 sec hold  Seated pball press down ab activation 10 sec hold x 10 submax Seated multifidi isometric UE lift into table 10 sec hold x 10  Standing lumbar extension AROM 2 x 5  Nustep Lvl 5 UE/LE for aerobic exercise/mobility 7 mins - stopped due to burning in Lt hip  Discussed benefits for aquatic therapy option.     TODAY'S TREATMENT:                                                     DATE: 06/20/2023 Eval HEP creation and review with demonstration and trial set preformed, see below for details IFC Estim X 15 min to lumbar/thoracic P.S, with intensity turned up to tolerance and  with moist heat    PATIENT EDUCATION: Education details: HEP, PT plan of care, weight loss strategies Person educated: Patient Education method: Explanation, Demonstration, Verbal cues, and Handouts Education comprehension: verbalized understanding and needs further education   HOME EXERCISE PROGRAM: Access Code: [email or web address] URL: [email or web address] Date: 06/20/2023 Prepared by: [name]  Exercises - Standing Lumbar Extension  - 2 x daily - 6 x weekly - 2 sets - 10 reps - 5 hold - Scapular Retraction with Resistance  - 2 x daily - 6 x weekly - 2 sets - 15 reps - Shoulder extension with resistance - Neutral  - 2 x daily - 6 x weekly - 2 sets - 15 reps - Standing Hip Extension with Counter Support  - 2 x daily - 6 x weekly - 1 sets - 15 reps - Standing 'L' Stretch at Counter  - 2 x daily - 6 x weekly - 1 sets - 10 reps - 5 hold - Supine Lower Trunk Rotation  - 2 x daily - 6 x weekly - 1 sets - 5 reps - 10 sec hold  ASSESSMENT:  CLINICAL IMPRESSION: He is overall doing a little better with activity tolerance in pain but still with some pain in his hip. PT recommending to continue with land and water PT to improve function.   OBJECTIVE IMPAIRMENTS: decreased activity tolerance, difficulty walking, decreased balance, decreased endurance, decreased mobility, decreased ROM, decreased strength, impaired flexibility, impaired LE use, postural dysfunction, and pain.  ACTIVITY LIMITATIONS: bending, lifting, carry, locomotion, cleaning, community activity,   PERSONAL FACTORS: see above PMH are also affecting patient's functional outcome.  REHAB POTENTIAL: Good  CLINICAL DECISION MAKING:  Stable/uncomplicated  EVALUATION COMPLEXITY: Low    GOALS: Short term PT Goals Target date: 07/18/2023   Pt will be I and compliant with HEP. Baseline:  Goal status: New Pt will decrease pain by 25% overall Baseline: Goal status: New  Long term PT goals Target date:08/15/2023   Pt will improve lumbar AROM to [facility] to improve functional mobility Baseline: Goal status: New Pt will improve  hip/knee strength to at least 4+/5 MMT to improve functional strength Baseline: Goal status: New Pt will improve FOTO to at least 47 % functional to show improved function Baseline: Goal status: New Pt will reduce pain  by overall 50% so less than 5/10 overall with usual activity Baseline:9.5/10 Goal status: Ne Pt will improve TUG time to less than 13 seconds to show improved gait speed and sit to stand ability.  Baseline: Goal status: New  PLAN: PT FREQUENCY: 1-2 times per week   PT DURATION: 6-8 weeks  PLANNED INTERVENTIONS (unless contraindicated): aquatic PT, Canalith repositioning, cryotherapy, Electrical stimulation, Iontophoresis with 4 mg/ml dexamethasome, Moist heat, traction, Ultrasound, gait training, Therapeutic exercise, balance training, neuromuscular re-education, patient/family education, prosthetic training, manual techniques, passive ROM, dry needling, taping, vasopnuematic device, vestibular, spinal manipulations, joint manipulations [id number]-Therapeutic exercises, 97530- Therapeutic activity, [id number]- Neuromuscular re-education, 97535- Self Care, [id number]- Manual therapy, [id number]- Gait training, [phone]- Aquatic Therapy, [phone]- Electrical stimulation (manual), and Dry Needling  PLAN FOR NEXT SESSION: assess response to aquatic PT, if he joins gym with pool then get him aquatic HEP.   [name], PT, DPT 07/10/23 2:28 PM     Date of referral: 06/05/23 Referring provider: Persons, Ronal Dragon, [place]  Referring diagnosis? M54.40 (ICD-10-CM) - Acute bilateral low back pain with  sciatica, sciatica laterality unspecified Treatment diagnosis? (if different than referring diagnosis) m54.59  What was this (referring dx) caused by? Ongoing Issue and Arthritis  Lysle of Condition: Chronic (continuous duration > 3 months)   Laterality: Both  Current Functional Measure Score: FOTO 26% functional intake  Objective measurements identify impairments when they are compared to normal values, the uninvolved extremity, and prior level of function.  [x]  Yes  []  No  Objective assessment of functional ability: Severe functional limitations   Briefly describe symptoms: chronic back pain that limits his ability for standing and walking  How did symptoms start: unspecified, insidious onset  Average pain intensity:  Last 24 hours: 9.5/10  Past week: 03/07/09  How often does the pt experience symptoms? Constantly  How much have the symptoms interfered with usual daily activities? Extremely  How has condition changed since care began at this facility? NA - initial visit  In general, how is the patients overall health? Poor   BACK PAIN (STarT Back Screening Tool) No, did FOTO instead

## 2023-07-12 ENCOUNTER — Encounter: Payer: Medicare Other | Admitting: Physical Therapy

## 2023-07-13 ENCOUNTER — Ambulatory Visit: Payer: Medicare Other | Admitting: Physical Therapy

## 2023-07-17 ENCOUNTER — Ambulatory Visit: Payer: Medicare Other | Admitting: Physical Therapy

## 2023-07-17 ENCOUNTER — Encounter: Payer: Self-pay | Admitting: Physical Therapy

## 2023-07-17 ENCOUNTER — Telehealth: Payer: Self-pay

## 2023-07-17 DIAGNOSIS — R262 Difficulty in walking, not elsewhere classified: Secondary | ICD-10-CM

## 2023-07-17 DIAGNOSIS — M6281 Muscle weakness (generalized): Secondary | ICD-10-CM | POA: Diagnosis not present

## 2023-07-17 DIAGNOSIS — M5459 Other low back pain: Secondary | ICD-10-CM

## 2023-07-17 DIAGNOSIS — I10 Essential (primary) hypertension: Secondary | ICD-10-CM

## 2023-07-17 DIAGNOSIS — R351 Nocturia: Secondary | ICD-10-CM

## 2023-07-17 MED ORDER — OLMESARTAN MEDOXOMIL 20 MG PO TABS: 20.0000 mg | ORAL_TABLET | Freq: Every day | ORAL | 2 refills | Status: DC

## 2023-07-17 MED ORDER — DOXAZOSIN MESYLATE 8 MG PO TABS: 8.0000 mg | ORAL_TABLET | Freq: Every day | ORAL | 1 refills | Status: DC

## 2023-07-17 MED ORDER — VERAPAMIL HCL ER 180 MG PO TBCR: 360.0000 mg | EXTENDED_RELEASE_TABLET | Freq: Every day | ORAL | 0 refills | Status: DC

## 2023-07-17 NOTE — Therapy (Signed)
 OUTPATIENT PHYSICAL THERAPY TREATMENT   Patient Name: Craig Guzman. MRN: [id number] DOB:Nov 14, 1970, 53 y.o., male Today's Date: 07/17/2023  END OF SESSION:  PT End of Session - 07/17/23 1123     Visit Number 5    Number of Visits 12    Date for PT Re-Evaluation 08/15/23    Authorization Type [employer] MCR    PT Start Time 1115    PT Stop Time 1153    PT Time Calculation (min) 38 min    Activity Tolerance Patient tolerated treatment well    Behavior During Therapy WFL for tasks assessed/performed               Past Medical History:  Diagnosis Date   Allergy     trees/ grasses/ animals/dust/mold   Biliary dyskinesia    Colloid thyroid  nodule    COVID-19 01/2021   Crohn's disease (HCC)    Stomach, terminal ileum, cecum   Dyspnea    due to nasal /sinus congestion   Gastric ulcer    antral   GERD (gastroesophageal reflux disease)    History of kidney stones    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   History of migraine headaches    HTN (hypertension)    Kidney stone 09/20/2012   Migraine    Obesity    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    Pneumonia 02/27/2014   ED   Postsurgical dumping syndrome 02/14/2019   Sleep apnea    has cpap- does not wear    Past Surgical History:  Procedure Laterality Date   BALLOON DILATION N/A 12/06/2017   Procedure: BALLOON DILATION;  Surgeon: Avram Lupita BRAVO, MD;  Location: [facility];  Service: Endoscopy;  Laterality: N/A;   BIOPSY  06/21/2018   Procedure: BIOPSY;  Surgeon: Avram Lupita BRAVO, MD;  Location: [facility];  Service: Endoscopy;;   CHOLECYSTECTOMY  2011   [facility]   CIRCUMCISION N/A 12/09/2018   Procedure: CIRCUMCISION ADULT;  Surgeon: Carolee Sherwood JONETTA DOUGLAS, MD;  Location: [facility];  Service: Urology;  Laterality: N/A;   COLONOSCOPY  07/26/11   Crohn's colitis, ileitis   ESOPHAGOGASTRODUODENOSCOPY  05/04/11, 07/26/11   granulomatous gastritis - Crohn's   ESOPHAGOGASTRODUODENOSCOPY (EGD) WITH PROPOFOL   N/A 03/28/2017   Procedure: ESOPHAGOGASTRODUODENOSCOPY (EGD) WITH PROPOFOL   with balloon dilation of duodenum;  Surgeon: Leigh Elspeth SQUIBB, MD;  Location: [facility];  Service: Gastroenterology;  Laterality: N/A;   ESOPHAGOGASTRODUODENOSCOPY (EGD) WITH PROPOFOL  N/A 12/06/2017   Procedure: ESOPHAGOGASTRODUODENOSCOPY (EGD) WITH PROPOFOL ;  Surgeon: Avram Lupita BRAVO, MD;  Location: [facility];  Service: Endoscopy;  Laterality: N/A;   ESOPHAGOGASTRODUODENOSCOPY (EGD) WITH PROPOFOL  N/A 06/21/2018   Procedure: ESOPHAGOGASTRODUODENOSCOPY (EGD) WITH PROPOFOL ;  Surgeon: Avram Lupita BRAVO, MD;  Location: [facility];  Service: Endoscopy;  Laterality: N/A;   ETHMOIDECTOMY N/A 11/27/2018   Procedure: ENDOSCOPIC TOTAL ETHMOIDECTOMY;  Surgeon: Karis Clunes, MD;  Location: [facility];  Service: ENT;  Laterality: N/A;   FLEXIBLE SIGMOIDOSCOPY     GASTROJEJUNOSTOMY  01/2018   MULTIPLE TOOTH EXTRACTIONS  2005   NASAL SEPTOPLASTY W/ TURBINOPLASTY Bilateral 11/27/2018   Procedure: NASAL SEPTOPLASTY WITH BILATERAL TURBINATE REDUCTION;  Surgeon: Karis Clunes, MD;  Location: [facility];  Service: ENT;  Laterality: Bilateral;   SHOULDER SURGERY     right   SINUS ENDO WITH FUSION N/A 11/27/2018   Procedure: SINUS ENDO WITH FUSION;  Surgeon: Karis Clunes, MD;  Location: [facility];  Service: ENT;  Laterality: N/A;   UPPER GASTROINTESTINAL ENDOSCOPY  VASECTOMY     bilateral w/lysis of penile adhesions   Patient Active Problem List   Diagnosis Date Noted   Low back pain 06/05/2023   Seasonal and perennial allergic rhinitis 09/07/2022   Allergic conjunctivitis of both eyes 09/07/2022   Feeding problems 12/02/2021   Abdominal migraine, not intractable ? 03/21/2019   Postsurgical dumping syndrome 02/14/2019   S/P functional endoscopic sinus surgery 11/27/2018   Abdominal pain, epigastric    Angioedema 04/19/2018   Intractable vomiting with nausea    Cigarette nicotine dependence without complication 05/15/2016   Shifting sleep-work  schedule, affecting sleep 05/15/2016   Sleep deprivation 05/15/2016   Sleep related hypoventilation in conditions classified elsewhere 05/15/2016   Sleep related headaches 05/15/2016   Morbid obesity (HCC) 09/19/2013   Diarrhea 07/10/2013   Colloid thyroid  nodule 04/04/2013   Elevated blood pressure reading in office with diagnosis of hypertension 11/26/2012   Gastroparesis??? 12/08/2011   Common migraine without aura 12/08/2011   Gastroduodenal Crohn's disease (HCC) 05/17/2011   GERD (gastroesophageal reflux disease) 04/21/2011    PCP: Levora Reyes SAUNDERS, MD   REFERRING PROVIDER: Persons, Ronal Dragon, [place]   REFERRING DIAG:    M54.40 (ICD-10-CM) - Acute bilateral low back pain with sciatica, sciatica laterality unspecified    Rationale for Evaluation and Treatment: Rehabilitation  THERAPY DIAG:  Other low back pain  Muscle weakness (generalized)  Difficulty in walking, not elsewhere classified  ONSET DATE: 6 month history of back pain  SUBJECTIVE:                                                                                                                                                                                           SUBJECTIVE STATEMENT: Pt indicated some burning in his left hip, in the socket  Pt indicated a 3/10 upon arrival PERTINENT HISTORY:  Chrohns disease, obesity, sleep apnea  PAIN:  NPRS scale: 3/10 upon arrival.  Pain location:left hip Pain description: constant, achy, sharp Aggravating factors: putting on socks and shoes. Standing, walking, sitting straight up Relieving factors: leaning back   PRECAUTIONS: None  RED FLAGS: None   WEIGHT BEARING RESTRICTIONS: No  FALLS:  Has patient fallen in last 6 months? Yes. Number of falls 1, slipped on last step on clothes  OCCUPATION: disabillity  PLOF: Independent with basic ADLS  PATIENT GOALS: reduce the pain, loose weight  NEXT MD VISIT: 07/31/23  OBJECTIVE:  Note: Objective  measures were completed at Evaluation unless otherwise noted.  DIAGNOSTIC FINDINGS:  06/20/2023 review:  XR IMPRESSION: Mild bilateral hip osteoarthritis. Multilevel degenerative disc disease, most prominent at L3-L4 and L4-L5. Mild thoracic degenerative spurring  PATIENT SURVEYS:  Eval FOTO 26% functional, goal is 47%  COGNITION: 06/20/2023 Overall cognitive status: Within functional limits for tasks assessed     SENSATION: 06/20/2023 [facility]  MUSCLE LENGTH: 06/20/2023 Hamstrings and paraspinals tight bilat  LUMBAR ROM:   AROM 06/20/2023 07/03/2023  Flexion 50% and pain worse   Extension 75% and pain improves 75% WFL  Right lateral flexion    Left lateral flexion    Right rotation 50%    Left rotation 50% and pain    (Blank rows = not tested)  LOWER EXTREMITY MMT:    MMT Right 06/20/2023 Left 06/20/2023  Hip flexion 4 4  Hip extension    Hip abduction 4 4  Hip adduction    Hip internal rotation    Hip external rotation    Knee flexion 5 5  Knee extension 5 5  Ankle dorsiflexion    Ankle plantarflexion    Ankle inversion    Ankle eversion     (Blank rows = not tested)  LUMBAR SPECIAL TESTS:  06/20/2023 Slump test: Negative  FUNCTIONAL TESTS:  06/20/2023 Eval: Timed up and go (TUG): 16:16  GAIT: 06/20/2023 Comments: antalgic gait with wide BOS, slow gait velocity                     TODAY'S TREATMENT:                                                     DATE:  07/17/2023  Therex Nustep Lvl 4-3 UE/LE for aerobic exercise/mobility 8 mins Standing alternating unilateral rows X 20 bilat with blue band Standing shoulder extensions blue X 20 bilat Supine lumbar trunk rotation 5 sec x 10 bilateral Supine bridge 2 x 10 2-3 sec hold  Supine clams blue 2X15 Supine ball squeeze hip adduction isometric 5 sec X 10 Supine SLR 2X5 reps Seated pball press down ab activation 5 sec hold x 10 submax Seated pball isometric hip flexion ab activation 5 sec X 5  bilat Standing lumbar extension AROM X10   07/10/2023 Manual Percussive device low level to mid and lower back paraspinals, QL bilateral.  Discussed and education for home use if desired.   Therex Supine lumbar trunk rotation 5 sec x 10 bilateral Supine bridge 2 x 10 2-3 sec hold  Supine clams blue 2X15 Supine SLR 2X5 reps Seated pball press down ab activation 5 sec hold x 10 submax Seated pball isometric hip flexion ab activation 5 sec X 5 bilat Standing lumbar extension AROM 2 x 5  Nustep Lvl 4 UE/LE for aerobic exercise/mobility 8 mins    DATE: 07/06/23 Pt seen for aquatic therapy today.  Treatment took place in water 3.5-4.75 ft in depth at the [facility] pool. Temp of water was 91.  Pt entered/exited the pool via stairs with hand rail.   Pt requires the buoyancy and hydrostatic pressure of water for support, and to offload joints by unweighting joint load by at least 50 % in navel deep water and by at least 75-80% in chest to neck deep water.  Viscosity of the water is needed for resistance of strengthening. Water current perturbations provides challenge to standing balance requiring increased core activation. Aquatic PT exercises Forward walking and backward walking 4 round trips, no UE support Sidestepping 4 round trips Tandem walk 3 round  trips with UE support of noodle, then 1 trip without noodle Lumbar flexion L stretch 5 sec X 10 holding at pool wall Leg swings hip abd/add X20 bilat with UE support with UE support Leg swings hip flexion/extension X 20 bilat Push/pull with kickboard 2 X 15 in lunge stance  Shoulder extension with kickboard X 20 Shoulder adduction/abduction X 15 bilat with water dumbells Shoulder push/pull X 20 bilat with water dumbells Stir the pot with single water dumbell X 15 Clockwise, X 15 CCW. Monster walks 3 round trips March walk 3 round trips Seated on noodle bicycle X 5 min for spinal decompression    PATIENT EDUCATION: Education  details: HEP, PT plan of care, weight loss strategies Person educated: Patient Education method: Explanation, Demonstration, Verbal cues, and Handouts Education comprehension: verbalized understanding and needs further education   HOME EXERCISE PROGRAM: Access Code: [email or web address] URL: [email or web address] Date: 06/20/2023 Prepared by: [name]  Exercises - Standing Lumbar Extension  - 2 x daily - 6 x weekly - 2 sets - 10 reps - 5 hold - Scapular Retraction with Resistance  - 2 x daily - 6 x weekly - 2 sets - 15 reps - Shoulder extension with resistance - Neutral  - 2 x daily - 6 x weekly - 2 sets - 15 reps - Standing Hip Extension with Counter Support  - 2 x daily - 6 x weekly - 1 sets - 15 reps - Standing 'L' Stretch at Counter  - 2 x daily - 6 x weekly - 1 sets - 10 reps - 5 hold - Supine Lower Trunk Rotation  - 2 x daily - 6 x weekly - 1 sets - 5 reps - 10 sec hold  ASSESSMENT:  CLINICAL IMPRESSION: He showed fair overall tolerance to exercise progressions today but sill limited in his left hip for flexion activities. PT will continue to work on strengthening and overall activity levels to improve function.   OBJECTIVE IMPAIRMENTS: decreased activity tolerance, difficulty walking, decreased balance, decreased endurance, decreased mobility, decreased ROM, decreased strength, impaired flexibility, impaired LE use, postural dysfunction, and pain.  ACTIVITY LIMITATIONS: bending, lifting, carry, locomotion, cleaning, community activity,   PERSONAL FACTORS: see above PMH are also affecting patient's functional outcome.  REHAB POTENTIAL: Good  CLINICAL DECISION MAKING: Stable/uncomplicated  EVALUATION COMPLEXITY: Low    GOALS: Short term PT Goals Target date: 07/18/2023   Pt will be I and compliant with HEP. Baseline:  Goal status: New Pt will decrease pain by 25% overall Baseline: Goal status: New  Long term PT goals Target date:08/15/2023   Pt will  improve lumbar AROM to [facility] to improve functional mobility Baseline: Goal status: New Pt will improve  hip/knee strength to at least 4+/5 MMT to improve functional strength Baseline: Goal status: New Pt will improve FOTO to at least 47 % functional to show improved function Baseline: Goal status: New Pt will reduce pain by overall 50% so less than 5/10 overall with usual activity Baseline:9.5/10 Goal status: Ne Pt will improve TUG time to less than 13 seconds to show improved gait speed and sit to stand ability.  Baseline: Goal status: New  PLAN: PT FREQUENCY: 1-2 times per week   PT DURATION: 6-8 weeks  PLANNED INTERVENTIONS (unless contraindicated): aquatic PT, Canalith repositioning, cryotherapy, Electrical stimulation, Iontophoresis with 4 mg/ml dexamethasome, Moist heat, traction, Ultrasound, gait training, Therapeutic exercise, balance training, neuromuscular re-education, patient/family education, prosthetic training, manual techniques, passive ROM, dry needling, taping, vasopnuematic device, vestibular, spinal manipulations, joint manipulations  [id number]-Therapeutic exercises, 97530- Therapeutic activity, [id number]- Neuromuscular re-education, [phone]- Self Care, [id number]- Manual therapy, [phone]- Gait training, [phone]- Aquatic Therapy, [phone]- Electrical stimulation (manual), and Dry Needling  PLAN FOR NEXT SESSION: aquatic PT next session   [name], PT, DPT 07/17/23 11:57 AM     Date of referral: 06/05/23 Referring provider: Persons, Ronal Dragon, [place]  Referring diagnosis? M54.40 (ICD-10-CM) - Acute bilateral low back pain with sciatica, sciatica laterality unspecified Treatment diagnosis? (if different than referring diagnosis) m54.59  What was this (referring dx) caused by? Ongoing Issue and Arthritis  Lysle of Condition: Chronic (continuous duration > 3 months)   Laterality: Both  Current Functional Measure Score: FOTO 26% functional intake  Objective measurements identify  impairments when they are compared to normal values, the uninvolved extremity, and prior level of function.  [x]  Yes  []  No  Objective assessment of functional ability: Severe functional limitations   Briefly describe symptoms: chronic back pain that limits his ability for standing and walking  How did symptoms start: unspecified, insidious onset  Average pain intensity:  Last 24 hours: 9.5/10  Past week: 03/07/09  How often does the pt experience symptoms? Constantly  How much have the symptoms interfered with usual daily activities? Extremely  How has condition changed since care began at this facility? NA - initial visit  In general, how is the patients overall health? Poor   BACK PAIN (STarT Back Screening Tool) No, did FOTO instead

## 2023-07-17 NOTE — Telephone Encounter (Signed)
 Copied from CRM [id number]. Topic: Clinical - Medication Question >> Jul 17, 2023  9:13 AM Deaijah H wrote: Reason for CRM: [facility] called in to request for 3 prescriptions and would like them sent there: [facility]  [address] [place], [place]  [address]  Phone: [phone] Fax : [phone] / verapamil  (CALAN -SR) 180 MG CR tablet, olmesartan  (BENICAR ) 20 MG tablet, doxazosin  (CARDURA ) 8 MG tablet. / tried reaching out to patient, no answer

## 2023-07-19 ENCOUNTER — Encounter: Payer: Medicare Other | Admitting: Physical Therapy

## 2023-07-20 ENCOUNTER — Ambulatory Visit: Payer: Medicare Other | Admitting: Physical Therapy

## 2023-07-24 ENCOUNTER — Ambulatory Visit: Payer: Medicare Other | Admitting: Physical Therapy

## 2023-07-24 ENCOUNTER — Encounter: Payer: Self-pay | Admitting: Physical Therapy

## 2023-07-24 DIAGNOSIS — R262 Difficulty in walking, not elsewhere classified: Secondary | ICD-10-CM

## 2023-07-24 DIAGNOSIS — M6281 Muscle weakness (generalized): Secondary | ICD-10-CM | POA: Diagnosis not present

## 2023-07-24 DIAGNOSIS — M5459 Other low back pain: Secondary | ICD-10-CM | POA: Diagnosis not present

## 2023-07-24 NOTE — Therapy (Signed)
OUTPATIENT PHYSICAL THERAPY TREATMENT   Patient Name: Craig Guzman. MRN: [id number] DOB:[birth year], 53 y.o., male Today's Date: 07/24/2023  END OF SESSION:  PT End of Session - 07/24/23 1216     Visit Number 6    Number of Visits 12    Date for PT Re-Evaluation 08/15/23    Authorization Type [employer] MCR    PT Start Time 1140    PT Stop Time 1218    PT Time Calculation (min) 38 min    Activity Tolerance Patient tolerated treatment well    Behavior During Therapy WFL for tasks assessed/performed                Past Medical History:  Diagnosis Date   Allergy    trees/ grasses/ animals/dust/mold   Biliary dyskinesia    Colloid thyroid nodule    COVID-19 01/2021   Crohn's disease (HCC)    Stomach, terminal ileum, cecum   Dyspnea    due to nasal /sinus congestion   Gastric ulcer    antral   GERD (gastroesophageal reflux disease)    History of kidney stones    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   History of migraine headaches    HTN (hypertension)    Kidney stone 09/20/2012   Migraine    Obesity    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    Pneumonia 02/27/2014   ED   Postsurgical dumping syndrome 02/14/2019   Sleep apnea    has cpap- does not wear    Past Surgical History:  Procedure Laterality Date   BALLOON DILATION N/A 12/06/2017   Procedure: BALLOON DILATION;  Surgeon: Iva Boop, MD;  Location: [facility];  Service: Endoscopy;  Laterality: N/A;   BIOPSY  06/21/2018   Procedure: BIOPSY;  Surgeon: Iva Boop, MD;  Location: [facility];  Service: Endoscopy;;   CHOLECYSTECTOMY  2011   [facility]   CIRCUMCISION N/A 12/09/2018   Procedure: CIRCUMCISION ADULT;  Surgeon: Crista Elliot, MD;  Location: [facility];  Service: Urology;  Laterality: N/A;   COLONOSCOPY  07/26/11   Crohn's colitis, ileitis   ESOPHAGOGASTRODUODENOSCOPY  05/04/11, 07/26/11   granulomatous gastritis - Crohn's   ESOPHAGOGASTRODUODENOSCOPY (EGD) WITH  PROPOFOL N/A 03/28/2017   Procedure: ESOPHAGOGASTRODUODENOSCOPY (EGD) WITH PROPOFOL  with balloon dilation of duodenum;  Surgeon: Benancio Deeds, MD;  Location: [facility];  Service: Gastroenterology;  Laterality: N/A;   ESOPHAGOGASTRODUODENOSCOPY (EGD) WITH PROPOFOL N/A 12/06/2017   Procedure: ESOPHAGOGASTRODUODENOSCOPY (EGD) WITH PROPOFOL;  Surgeon: Iva Boop, MD;  Location: [facility];  Service: Endoscopy;  Laterality: N/A;   ESOPHAGOGASTRODUODENOSCOPY (EGD) WITH PROPOFOL N/A 06/21/2018   Procedure: ESOPHAGOGASTRODUODENOSCOPY (EGD) WITH PROPOFOL;  Surgeon: Iva Boop, MD;  Location: [facility];  Service: Endoscopy;  Laterality: N/A;   ETHMOIDECTOMY N/A 11/27/2018   Procedure: ENDOSCOPIC TOTAL ETHMOIDECTOMY;  Surgeon: Newman Pies, MD;  Location: [facility];  Service: ENT;  Laterality: N/A;   FLEXIBLE SIGMOIDOSCOPY     GASTROJEJUNOSTOMY  01/2018   MULTIPLE TOOTH EXTRACTIONS  2005   NASAL SEPTOPLASTY W/ TURBINOPLASTY Bilateral 11/27/2018   Procedure: NASAL SEPTOPLASTY WITH BILATERAL TURBINATE REDUCTION;  Surgeon: Newman Pies, MD;  Location: [facility];  Service: ENT;  Laterality: Bilateral;   SHOULDER SURGERY     right   SINUS ENDO WITH FUSION N/A 11/27/2018   Procedure: SINUS ENDO WITH FUSION;  Surgeon: Newman Pies, MD;  Location: [facility];  Service: ENT;  Laterality: N/A;   UPPER GASTROINTESTINAL ENDOSCOPY  VASECTOMY     bilateral w/lysis of penile adhesions   Patient Active Problem List   Diagnosis Date Noted   Low back pain 06/05/2023   Seasonal and perennial allergic rhinitis 09/07/2022   Allergic conjunctivitis of both eyes 09/07/2022   Feeding problems 12/02/2021   Abdominal migraine, not intractable ? 03/21/2019   Postsurgical dumping syndrome 02/14/2019   S/P functional endoscopic sinus surgery 11/27/2018   Abdominal pain, epigastric    Angioedema 04/19/2018   Intractable vomiting with nausea    Cigarette nicotine dependence without complication 05/15/2016   Shifting  sleep-work schedule, affecting sleep 05/15/2016   Sleep deprivation 05/15/2016   Sleep related hypoventilation in conditions classified elsewhere 05/15/2016   Sleep related headaches 05/15/2016   Morbid obesity (HCC) 09/19/2013   Diarrhea 07/10/2013   Colloid thyroid nodule 04/04/2013   Elevated blood pressure reading in office with diagnosis of hypertension 11/26/2012   Gastroparesis??? 12/08/2011   Common migraine without aura 12/08/2011   Gastroduodenal Crohn's disease (HCC) 05/17/2011   GERD (gastroesophageal reflux disease) 04/21/2011    PCP: [name], MD   REFERRING PROVIDER: Persons, [place], [place]   REFERRING DIAG:    M54.40 (ICD-10-CM) - Acute bilateral low back pain with sciatica, sciatica laterality unspecified    Rationale for Evaluation and Treatment: Rehabilitation  THERAPY DIAG:  Other low back pain  Muscle weakness (generalized)  Difficulty in walking, not elsewhere classified  ONSET DATE: 6 month history of back pain  SUBJECTIVE:                                                                                                                                                                                           SUBJECTIVE STATEMENT: Pt indicated when he is walking the pain is fine but with certain hip flexion movements the pain is bad in left hip PERTINENT HISTORY:  Chrohns disease, obesity, sleep apnea  PAIN:  NPRS scale: 7/10 left hip pain witch certain hip flexion movements  Pain location:left hip Pain description: constant, achy, sharp Aggravating factors: putting on socks and shoes. Standing, walking, sitting straight up Relieving factors: leaning back   PRECAUTIONS: None  RED FLAGS: None   WEIGHT BEARING RESTRICTIONS: No  FALLS:  Has patient fallen in last 6 months? Yes. Number of falls 1, slipped on last step on clothes  OCCUPATION: disabillity  PLOF: Independent with basic ADLS  PATIENT GOALS: reduce the pain, loose  weight  NEXT MD VISIT: 07/31/23  OBJECTIVE:  Note: Objective measures were completed at Evaluation unless otherwise noted.  DIAGNOSTIC FINDINGS:  06/20/2023 review:  XR IMPRESSION: Mild bilateral hip osteoarthritis. "Multilevel degenerative disc disease,  most prominent at L3-L4 and L4-L5. Mild thoracic degenerative spurring"  PATIENT SURVEYS:  Eval FOTO 26% functional, goal is 47%  COGNITION: 06/20/2023 Overall cognitive status: Within functional limits for tasks assessed     SENSATION: 06/20/2023 [facility]  MUSCLE LENGTH: 06/20/2023 Hamstrings and paraspinals tight bilat  LUMBAR ROM:   AROM 06/20/2023 07/03/2023  Flexion 50% and pain worse   Extension 75% and pain improves 75% WFL  Right lateral flexion    Left lateral flexion    Right rotation 50%    Left rotation 50% and pain    (Blank rows = not tested)  LOWER EXTREMITY MMT:    MMT Right 06/20/2023 Left 06/20/2023  Hip flexion 4 4  Hip extension    Hip abduction 4 4  Hip adduction    Hip internal rotation    Hip external rotation    Knee flexion 5 5  Knee extension 5 5  Ankle dorsiflexion    Ankle plantarflexion    Ankle inversion    Ankle eversion     (Blank rows = not tested)  LUMBAR SPECIAL TESTS:  06/20/2023 Slump test: Negative  FUNCTIONAL TESTS:  06/20/2023 Eval: Timed up and go (TUG): 16:16  GAIT: 06/20/2023 Comments: antalgic gait with wide BOS, slow gait velocity                     TODAY'S TREATMENT:                                                     DATE:  07/24/2023  Therex Nustep Lvl 3 UE/LE for aerobic exercise/mobility 9 mins Seated row machine 45# 2X15 Seated lat pulldown machine 45 #2X15 Seated chest press machine 25# 2X15 Seated pball press down ab activation 5 sec hold x 10 submax Seated pball isometric hip flexion ab activation 5 sec X 5 bilat Seated lumbar flexion stretch with ball 5 sec  X10 Standing lumbar extension AROM X10 Standing hip abduction red X 15  bilat Standing hip extension red X 15 bilat  07/17/2023  Therex Nustep Lvl 4-3 UE/LE for aerobic exercise/mobility 8 mins Standing alternating unilateral rows X 20 bilat with blue band Standing shoulder extensions blue X 20 bilat Supine lumbar trunk rotation 5 sec x 10 bilateral Supine bridge 2 x 10 2-3 sec hold  Supine clams blue 2X15 Supine ball squeeze hip adduction isometric 5 sec X 10 Supine SLR 2X5 reps Seated pball press down ab activation 5 sec hold x 10 submax Seated pball isometric hip flexion ab activation 5 sec X 5 bilat Standing lumbar extension AROM X10   07/10/2023 Manual Percussive device low level to mid and lower back paraspinals, QL bilateral.  Discussed and education for home use if desired.   Therex Supine lumbar trunk rotation 5 sec x 10 bilateral Supine bridge 2 x 10 2-3 sec hold  Supine clams blue 2X15 Supine SLR 2X5 reps Seated pball press down ab activation 5 sec hold x 10 submax Seated pball isometric hip flexion ab activation 5 sec X 5 bilat Standing lumbar extension AROM 2 x 5  Nustep Lvl 4 UE/LE for aerobic exercise/mobility 8 mins    DATE: 07/06/23 Pt seen for aquatic therapy today.  Treatment took place in water 3.5-4.75 ft in depth at the [facility] pool. Temp of water was 91.  Pt  entered/exited the pool via stairs with hand rail.   Pt requires the buoyancy and hydrostatic pressure of water for support, and to offload joints by unweighting joint load by at least 50 % in navel deep water and by at least 75-80% in chest to neck deep water.  Viscosity of the water is needed for resistance of strengthening. Water current perturbations provides challenge to standing balance requiring increased core activation. Aquatic PT exercises Forward walking and backward walking 4 round trips, no UE support Sidestepping 4 round trips Tandem walk 3 round trips with UE support of noodle, then 1 trip without noodle Lumbar flexion L stretch 5 sec X 10  holding at pool wall Leg swings hip abd/add X20 bilat with UE support with UE support Leg swings hip flexion/extension X 20 bilat Push/pull with kickboard 2 X 15 in lunge stance  Shoulder extension with kickboard X 20 Shoulder adduction/abduction X 15 bilat with water dumbells Shoulder push/pull X 20 bilat with water dumbells Stir the pot with single water dumbell X 15 Clockwise, X 15 CCW. Monster walks 3 round trips March walk 3 round trips Seated on noodle bicycle X 5 min for spinal decompression    PATIENT EDUCATION: Education details: HEP, PT plan of care, weight loss strategies Person educated: Patient Education method: Explanation, Demonstration, Verbal cues, and Handouts Education comprehension: verbalized understanding and needs further education   HOME EXERCISE PROGRAM: Access Code: [email or web address] URL: [email or web address] Date: 06/20/2023 Prepared by: Ivery Quale  Exercises - Standing Lumbar Extension  - 2 x daily - 6 x weekly - 2 sets - 10 reps - 5 hold - Scapular Retraction with Resistance  - 2 x daily - 6 x weekly - 2 sets - 15 reps - Shoulder extension with resistance - Neutral  - 2 x daily - 6 x weekly - 2 sets - 15 reps - Standing Hip Extension with Counter Support  - 2 x daily - 6 x weekly - 1 sets - 15 reps - Standing 'L' Stretch at Counter  - 2 x daily - 6 x weekly - 1 sets - 10 reps - 5 hold - Supine Lower Trunk Rotation  - 2 x daily - 6 x weekly - 1 sets - 5 reps - 10 sec hold  ASSESSMENT:  CLINICAL IMPRESSION: Worked on strength progressions today as tolerated without flaring up his left hip when possible. PT will continue to work on strengthening and overall activity levels to improve function.   OBJECTIVE IMPAIRMENTS: decreased activity tolerance, difficulty walking, decreased balance, decreased endurance, decreased mobility, decreased ROM, decreased strength, impaired flexibility, impaired LE use, postural dysfunction, and pain.  ACTIVITY  LIMITATIONS: bending, lifting, carry, locomotion, cleaning, community activity,   PERSONAL FACTORS: see above PMH are also affecting patient's functional outcome.  REHAB POTENTIAL: Good  CLINICAL DECISION MAKING: Stable/uncomplicated  EVALUATION COMPLEXITY: Low    GOALS: Short term PT Goals Target date: 07/18/2023   Pt will be I and compliant with HEP. Baseline:  Goal status: New Pt will decrease pain by 25% overall Baseline: Goal status: New  Long term PT goals Target date:08/15/2023   Pt will improve lumbar AROM to [facility] to improve functional mobility Baseline: Goal status: New Pt will improve  hip/knee strength to at least 4+/5 MMT to improve functional strength Baseline: Goal status: New Pt will improve FOTO to at least 47 % functional to show improved function Baseline: Goal status: New Pt will reduce pain by overall 50% so less than 5/10 overall with usual activity  Baseline:9.5/10 Goal status: Ne Pt will improve TUG time to less than 13 seconds to show improved gait speed and sit to stand ability.  Baseline: Goal status: New  PLAN: PT FREQUENCY: 1-2 times per week   PT DURATION: 6-8 weeks  PLANNED INTERVENTIONS (unless contraindicated): aquatic PT, Canalith repositioning, cryotherapy, Electrical stimulation, Iontophoresis with 4 mg/ml dexamethasome, Moist heat, traction, Ultrasound, gait training, Therapeutic exercise, balance training, neuromuscular re-education, patient/family education, prosthetic training, manual techniques, passive ROM, dry needling, taping, vasopnuematic device, vestibular, spinal manipulations, joint manipulations [id number]-Therapeutic exercises, 97530- Therapeutic activity, 97112- Neuromuscular re-education, 97535- Self Care, [id number]- Manual therapy, [phone]- Gait training, [phone]- Aquatic Therapy, [phone]- Electrical stimulation (manual), and Dry Needling  PLAN FOR NEXT SESSION: aquatic PT next session   Ivery Quale, PT, DPT 07/24/23 12:16  PM     Date of referral: 06/05/23 Referring provider: Persons, [place], [place]  Referring diagnosis? M54.40 (ICD-10-CM) - Acute bilateral low back pain with sciatica, sciatica laterality unspecified Treatment diagnosis? (if different than referring diagnosis) m54.59  What was this (referring dx) caused by? Ongoing Issue and Arthritis  Ashby Dawes of Condition: Chronic (continuous duration > 3 months)   Laterality: Both  Current Functional Measure Score: FOTO 26% functional intake  Objective measurements identify impairments when they are compared to normal values, the uninvolved extremity, and prior level of function.  [x]  Yes  []  No  Objective assessment of functional ability: Severe functional limitations   Briefly describe symptoms: chronic back pain that limits his ability for standing and walking  How did symptoms start: unspecified, insidious onset  Average pain intensity:  Last 24 hours: 9.5/10  Past week: 03/07/09  How often does the pt experience symptoms? Constantly  How much have the symptoms interfered with usual daily activities? Extremely  How has condition changed since care began at this facility? NA - initial visit  In general, how is the patients overall health? Poor   BACK PAIN (STarT Back Screening Tool) No, did FOTO instead

## 2023-07-26 ENCOUNTER — Encounter: Payer: Medicare Other | Admitting: Physical Therapy

## 2023-07-27 ENCOUNTER — Encounter: Payer: Self-pay | Admitting: Physical Therapy

## 2023-07-27 ENCOUNTER — Ambulatory Visit (HOSPITAL_BASED_OUTPATIENT_CLINIC_OR_DEPARTMENT_OTHER): Payer: Medicare Other | Admitting: Physical Therapy

## 2023-07-27 DIAGNOSIS — M5459 Other low back pain: Secondary | ICD-10-CM

## 2023-07-27 DIAGNOSIS — R262 Difficulty in walking, not elsewhere classified: Secondary | ICD-10-CM

## 2023-07-27 DIAGNOSIS — M6281 Muscle weakness (generalized): Secondary | ICD-10-CM

## 2023-07-27 NOTE — Therapy (Signed)
OUTPATIENT PHYSICAL THERAPY TREATMENT   Patient Name: Craig Guzman. MRN: [id number] DOB:02-Jul-1971, 53 y.o., male Today's Date: 07/27/2023  END OF SESSION:  PT End of Session - 07/27/23 1015     Visit Number 7    Number of Visits 12    Date for PT Re-Evaluation 08/15/23    Authorization Type [employer] MCR    PT Start Time 1000    PT Stop Time 1040    PT Time Calculation (min) 40 min    Activity Tolerance Patient tolerated treatment well    Behavior During Therapy WFL for tasks assessed/performed                Past Medical History:  Diagnosis Date   Allergy    trees/ grasses/ animals/dust/mold   Biliary dyskinesia    Colloid thyroid nodule    COVID-19 01/2021   Crohn's disease (HCC)    Stomach, terminal ileum, cecum   Dyspnea    due to nasal /sinus congestion   Gastric ulcer    antral   GERD (gastroesophageal reflux disease)    History of kidney stones    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   History of migraine headaches    HTN (hypertension)    Kidney stone 09/20/2012   Migraine    Obesity    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    Pneumonia 02/27/2014   ED   Postsurgical dumping syndrome 02/14/2019   Sleep apnea    has cpap- does not wear    Past Surgical History:  Procedure Laterality Date   BALLOON DILATION N/A 12/06/2017   Procedure: BALLOON DILATION;  Surgeon: Iva Boop, MD;  Location: [facility];  Service: Endoscopy;  Laterality: N/A;   BIOPSY  06/21/2018   Procedure: BIOPSY;  Surgeon: Iva Boop, MD;  Location: [facility];  Service: Endoscopy;;   CHOLECYSTECTOMY  2011   [facility]   CIRCUMCISION N/A 12/09/2018   Procedure: CIRCUMCISION ADULT;  Surgeon: Crista Elliot, MD;  Location: [facility];  Service: Urology;  Laterality: N/A;   COLONOSCOPY  07/26/11   Crohn's colitis, ileitis   ESOPHAGOGASTRODUODENOSCOPY  05/04/11, 07/26/11   granulomatous gastritis - Crohn's   ESOPHAGOGASTRODUODENOSCOPY (EGD) WITH  PROPOFOL N/A 03/28/2017   Procedure: ESOPHAGOGASTRODUODENOSCOPY (EGD) WITH PROPOFOL  with balloon dilation of duodenum;  Surgeon: Benancio Deeds, MD;  Location: [facility];  Service: Gastroenterology;  Laterality: N/A;   ESOPHAGOGASTRODUODENOSCOPY (EGD) WITH PROPOFOL N/A 12/06/2017   Procedure: ESOPHAGOGASTRODUODENOSCOPY (EGD) WITH PROPOFOL;  Surgeon: Iva Boop, MD;  Location: [facility];  Service: Endoscopy;  Laterality: N/A;   ESOPHAGOGASTRODUODENOSCOPY (EGD) WITH PROPOFOL N/A 06/21/2018   Procedure: ESOPHAGOGASTRODUODENOSCOPY (EGD) WITH PROPOFOL;  Surgeon: Iva Boop, MD;  Location: [facility];  Service: Endoscopy;  Laterality: N/A;   ETHMOIDECTOMY N/A 11/27/2018   Procedure: ENDOSCOPIC TOTAL ETHMOIDECTOMY;  Surgeon: Newman Pies, MD;  Location: [facility];  Service: ENT;  Laterality: N/A;   FLEXIBLE SIGMOIDOSCOPY     GASTROJEJUNOSTOMY  01/2018   MULTIPLE TOOTH EXTRACTIONS  2005   NASAL SEPTOPLASTY W/ TURBINOPLASTY Bilateral 11/27/2018   Procedure: NASAL SEPTOPLASTY WITH BILATERAL TURBINATE REDUCTION;  Surgeon: Newman Pies, MD;  Location: [facility];  Service: ENT;  Laterality: Bilateral;   SHOULDER SURGERY     right   SINUS ENDO WITH FUSION N/A 11/27/2018   Procedure: SINUS ENDO WITH FUSION;  Surgeon: Newman Pies, MD;  Location: [facility];  Service: ENT;  Laterality: N/A;   UPPER GASTROINTESTINAL ENDOSCOPY  VASECTOMY     bilateral w/lysis of penile adhesions   Patient Active Problem List   Diagnosis Date Noted   Low back pain 06/05/2023   Seasonal and perennial allergic rhinitis 09/07/2022   Allergic conjunctivitis of both eyes 09/07/2022   Feeding problems 12/02/2021   Abdominal migraine, not intractable ? 03/21/2019   Postsurgical dumping syndrome 02/14/2019   S/P functional endoscopic sinus surgery 11/27/2018   Abdominal pain, epigastric    Angioedema 04/19/2018   Intractable vomiting with nausea    Cigarette nicotine dependence without complication 05/15/2016   Shifting  sleep-work schedule, affecting sleep 05/15/2016   Sleep deprivation 05/15/2016   Sleep related hypoventilation in conditions classified elsewhere 05/15/2016   Sleep related headaches 05/15/2016   Morbid obesity (HCC) 09/19/2013   Diarrhea 07/10/2013   Colloid thyroid nodule 04/04/2013   Elevated blood pressure reading in office with diagnosis of hypertension 11/26/2012   Gastroparesis??? 12/08/2011   Common migraine without aura 12/08/2011   Gastroduodenal Crohn's disease (HCC) 05/17/2011   GERD (gastroesophageal reflux disease) 04/21/2011    PCP: [name], MD   REFERRING PROVIDER: [name], MD   REFERRING DIAG:    M54.40 (ICD-10-CM) - Acute bilateral low back pain with sciatica, sciatica laterality unspecified    Rationale for Evaluation and Treatment: Rehabilitation  THERAPY DIAG:  Other low back pain  Muscle weakness (generalized)  Difficulty in walking, not elsewhere classified  ONSET DATE: 6 month history of back pain  SUBJECTIVE:                                                                                                                                                                                           SUBJECTIVE STATEMENT: Pt indicated his hip is not hurting as much today but feeling it more in his back PERTINENT HISTORY:  Chrohns disease, obesity, sleep apnea  PAIN:  NPRS scale: 3/10 hip, 5/10 back Pain location:left hip Pain description: constant, achy, sharp Aggravating factors: putting on socks and shoes. Standing, walking, sitting straight up Relieving factors: leaning back   PRECAUTIONS: None  RED FLAGS: None   WEIGHT BEARING RESTRICTIONS: No  FALLS:  Has patient fallen in last 6 months? Yes. Number of falls 1, slipped on last step on clothes  OCCUPATION: disabillity  PLOF: Independent with basic ADLS  PATIENT GOALS: reduce the pain, loose weight  NEXT MD VISIT: 07/31/23  OBJECTIVE:  Note: Objective measures  were completed at Evaluation unless otherwise noted.  DIAGNOSTIC FINDINGS:  06/20/2023 review:  XR IMPRESSION: Mild bilateral hip osteoarthritis. "Multilevel degenerative disc disease, most prominent at L3-L4 and L4-L5. Mild thoracic degenerative spurring"  PATIENT  SURVEYS:  Eval FOTO 26% functional, goal is 47%  COGNITION: 06/20/2023 Overall cognitive status: Within functional limits for tasks assessed     SENSATION: 06/20/2023 [facility]  MUSCLE LENGTH: 06/20/2023 Hamstrings and paraspinals tight bilat  LUMBAR ROM:   AROM 06/20/2023 07/03/2023  Flexion 50% and pain worse   Extension 75% and pain improves 75% WFL  Right lateral flexion    Left lateral flexion    Right rotation 50%    Left rotation 50% and pain    (Blank rows = not tested)  LOWER EXTREMITY MMT:    MMT Right 06/20/2023 Left 06/20/2023  Hip flexion 4 4  Hip extension    Hip abduction 4 4  Hip adduction    Hip internal rotation    Hip external rotation    Knee flexion 5 5  Knee extension 5 5  Ankle dorsiflexion    Ankle plantarflexion    Ankle inversion    Ankle eversion     (Blank rows = not tested)  LUMBAR SPECIAL TESTS:  06/20/2023 Slump test: Negative  FUNCTIONAL TESTS:  06/20/2023 Eval: Timed up and go (TUG): 16:16  GAIT: 06/20/2023 Comments: antalgic gait with wide BOS, slow gait velocity                     TODAY'S TREATMENT:                                                     DATE:    DATE: 07/27/23 Pt seen for aquatic therapy today.  Treatment took place in water 3.5-4.75 ft in depth at the [facility] pool. Temp of water was 91.  Pt entered/exited the pool via stairs with hand rail.   Pt requires the buoyancy and hydrostatic pressure of water for support, and to offload joints by unweighting joint load by at least 50 % in navel deep water and by at least 75-80% in chest to neck deep water.  Viscosity of the water is needed for resistance of strengthening. Water current  perturbations provides challenge to standing balance requiring increased core activation. Aquatic PT exercises Forward walking and backward walking 4 round trips, no UE support Sidestepping 4 round trips Tandem walk 3 round trips with UE support of noodle, then 1 trip without noodle Lumbar flexion L stretch 5 sec X 10 holding at pool wall Leg swings hip abd/add X20 bilat with UE support with UE support Leg swings hip flexion/extension X 20 bilat Push/pull with kickboard 2 X 15 in lunge stance  Shoulder extension with kickboard X 20 Shoulder adduction/abduction X 15 bilat with water dumbells Shoulder push/pull X 20 bilat with water dumbells Stir the pot with single water dumbell X 15 Clockwise, X 15 CCW. Monster walks 3 round trips Horizontal abd/add with dumbells  X20 Seated on noodle bicycle X 5 min for spinal decompression    07/24/2023  Therex Nustep Lvl 3 UE/LE for aerobic exercise/mobility 9 mins Seated row machine 45# 2X15 Seated lat pulldown machine 45 #2X15 Seated chest press machine 25# 2X15 Seated pball press down ab activation 5 sec hold x 10 submax Seated pball isometric hip flexion ab activation 5 sec X 5 bilat Seated lumbar flexion stretch with ball 5 sec  X10 Standing lumbar extension AROM X10 Standing hip abduction red X 15 bilat Standing hip extension red X  15 bilat  07/17/2023  Therex Nustep Lvl 4-3 UE/LE for aerobic exercise/mobility 8 mins Standing alternating unilateral rows X 20 bilat with blue band Standing shoulder extensions blue X 20 bilat Supine lumbar trunk rotation 5 sec x 10 bilateral Supine bridge 2 x 10 2-3 sec hold  Supine clams blue 2X15 Supine ball squeeze hip adduction isometric 5 sec X 10 Supine SLR 2X5 reps Seated pball press down ab activation 5 sec hold x 10 submax Seated pball isometric hip flexion ab activation 5 sec X 5 bilat Standing lumbar extension AROM X10   07/10/2023 Manual Percussive device low level to mid and lower  back paraspinals, QL bilateral.  Discussed and education for home use if desired.   Therex Supine lumbar trunk rotation 5 sec x 10 bilateral Supine bridge 2 x 10 2-3 sec hold  Supine clams blue 2X15 Supine SLR 2X5 reps Seated pball press down ab activation 5 sec hold x 10 submax Seated pball isometric hip flexion ab activation 5 sec X 5 bilat Standing lumbar extension AROM 2 x 5  Nustep Lvl 4 UE/LE for aerobic exercise/mobility 8 mins      PATIENT EDUCATION: Education details: HEP, PT plan of care, weight loss strategies Person educated: Patient Education method: Explanation, Demonstration, Verbal cues, and Handouts Education comprehension: verbalized understanding and needs further education   HOME EXERCISE PROGRAM: Access Code: [email or web address] URL: [email or web address] Date: 06/20/2023 Prepared by: Ivery Quale  Exercises - Standing Lumbar Extension  - 2 x daily - 6 x weekly - 2 sets - 10 reps - 5 hold - Scapular Retraction with Resistance  - 2 x daily - 6 x weekly - 2 sets - 15 reps - Shoulder extension with resistance - Neutral  - 2 x daily - 6 x weekly - 2 sets - 15 reps - Standing Hip Extension with Counter Support  - 2 x daily - 6 x weekly - 1 sets - 15 reps - Standing 'L' Stretch at Counter  - 2 x daily - 6 x weekly - 1 sets - 10 reps - 5 hold - Supine Lower Trunk Rotation  - 2 x daily - 6 x weekly - 1 sets - 5 reps - 10 sec hold  ASSESSMENT:  CLINICAL IMPRESSION: He had good tolerance to water session. The water helps with activity tolerance in his left hip which has been bothering him lately especially with flexion activities. Did feel better today in pool.    OBJECTIVE IMPAIRMENTS: decreased activity tolerance, difficulty walking, decreased balance, decreased endurance, decreased mobility, decreased ROM, decreased strength, impaired flexibility, impaired LE use, postural dysfunction, and pain.  ACTIVITY LIMITATIONS: bending, lifting, carry, locomotion,  cleaning, community activity,   PERSONAL FACTORS: see above PMH are also affecting patient's functional outcome.  REHAB POTENTIAL: Good  CLINICAL DECISION MAKING: Stable/uncomplicated  EVALUATION COMPLEXITY: Low    GOALS: Short term PT Goals Target date: 07/18/2023   Pt will be I and compliant with HEP. Baseline:  Goal status: New Pt will decrease pain by 25% overall Baseline: Goal status: New  Long term PT goals Target date:08/15/2023   Pt will improve lumbar AROM to [facility] to improve functional mobility Baseline: Goal status: New Pt will improve  hip/knee strength to at least 4+/5 MMT to improve functional strength Baseline: Goal status: New Pt will improve FOTO to at least 47 % functional to show improved function Baseline: Goal status: New Pt will reduce pain by overall 50% so less than 5/10 overall with usual activity Baseline:9.5/10 Goal  status: Ne Pt will improve TUG time to less than 13 seconds to show improved gait speed and sit to stand ability.  Baseline: Goal status: New  PLAN: PT FREQUENCY: 1-2 times per week   PT DURATION: 6-8 weeks  PLANNED INTERVENTIONS (unless contraindicated): aquatic PT, Canalith repositioning, cryotherapy, Electrical stimulation, Iontophoresis with 4 mg/ml dexamethasome, Moist heat, traction, Ultrasound, gait training, Therapeutic exercise, balance training, neuromuscular re-education, patient/family education, prosthetic training, manual techniques, passive ROM, dry needling, taping, vasopnuematic device, vestibular, spinal manipulations, joint manipulations [id number]-Therapeutic exercises, 97530- Therapeutic activity, [id number]- Neuromuscular re-education, 97535- Self Care, [id number]- Manual therapy, [id number]- Gait training, [id number]- Aquatic Therapy, [id number]- Electrical stimulation (manual), and Dry Needling  PLAN FOR NEXT SESSION: mix on land and water visits, core strength and hip exercises as tolerated. General strength and conditioning progressions  as tolerated.  Ivery Quale, PT, DPT 07/27/23 10:16 AM     Date of referral: 06/05/23 Referring provider: [name], MD  Referring diagnosis? M54.40 (ICD-10-CM) - Acute bilateral low back pain with sciatica, sciatica laterality unspecified Treatment diagnosis? (if different than referring diagnosis) m54.59  What was this (referring dx) caused by? Ongoing Issue and Arthritis  Ashby Dawes of Condition: Chronic (continuous duration > 3 months)   Laterality: Both  Current Functional Measure Score: FOTO 26% functional intake  Objective measurements identify impairments when they are compared to normal values, the uninvolved extremity, and prior level of function.  [x]  Yes  []  No  Objective assessment of functional ability: Severe functional limitations   Briefly describe symptoms: chronic back pain that limits his ability for standing and walking  How did symptoms start: unspecified, insidious onset  Average pain intensity:  Last 24 hours: 9.5/10  Past week: 03/07/09  How often does the pt experience symptoms? Constantly  How much have the symptoms interfered with usual daily activities? Extremely  How has condition changed since care began at this facility? NA - initial visit  In general, how is the patients overall health? Poor   BACK PAIN (STarT Back Screening Tool) No, did FOTO instead

## 2023-07-31 ENCOUNTER — Ambulatory Visit: Payer: Medicare Other | Admitting: Physical Therapy

## 2023-07-31 ENCOUNTER — Ambulatory Visit (INDEPENDENT_AMBULATORY_CARE_PROVIDER_SITE_OTHER): Payer: Medicare Other | Admitting: Physician Assistant

## 2023-07-31 ENCOUNTER — Encounter: Payer: Self-pay | Admitting: Sports Medicine

## 2023-07-31 ENCOUNTER — Other Ambulatory Visit: Payer: Self-pay

## 2023-07-31 ENCOUNTER — Ambulatory Visit (INDEPENDENT_AMBULATORY_CARE_PROVIDER_SITE_OTHER): Payer: Medicare Other | Admitting: Sports Medicine

## 2023-07-31 ENCOUNTER — Encounter: Payer: Self-pay | Admitting: Physical Therapy

## 2023-07-31 DIAGNOSIS — M1612 Unilateral primary osteoarthritis, left hip: Secondary | ICD-10-CM

## 2023-07-31 DIAGNOSIS — M6281 Muscle weakness (generalized): Secondary | ICD-10-CM | POA: Diagnosis not present

## 2023-07-31 DIAGNOSIS — M5459 Other low back pain: Secondary | ICD-10-CM | POA: Diagnosis not present

## 2023-07-31 DIAGNOSIS — M25552 Pain in left hip: Secondary | ICD-10-CM | POA: Diagnosis not present

## 2023-07-31 DIAGNOSIS — R262 Difficulty in walking, not elsewhere classified: Secondary | ICD-10-CM

## 2023-07-31 MED ORDER — METHYLPREDNISOLONE ACETATE 40 MG/ML IJ SUSP
80.0000 mg | INTRAMUSCULAR | Status: AC | PRN
  Administered 2023-07-31: 80 mg via INTRA_ARTICULAR

## 2023-07-31 MED ORDER — LIDOCAINE HCL 1 % IJ SOLN
4.0000 mL | INTRAMUSCULAR | Status: AC | PRN
  Administered 2023-07-31: 4 mL

## 2023-07-31 NOTE — Therapy (Signed)
OUTPATIENT PHYSICAL THERAPY TREATMENT   Patient Name: Datron Brakebill. MRN: [id number] DOB:1971-01-14, 53 y.o., male Today's Date: 07/31/2023  END OF SESSION:  PT End of Session - 07/31/23 1152     Visit Number 8    Number of Visits 12    Date for PT Re-Evaluation 08/15/23    Authorization Type [employer] MCR    PT Start Time 1140    PT Stop Time 1218    PT Time Calculation (min) 38 min    Activity Tolerance Patient tolerated treatment well    Behavior During Therapy WFL for tasks assessed/performed                Past Medical History:  Diagnosis Date   Allergy    trees/ grasses/ animals/dust/mold   Biliary dyskinesia    Colloid thyroid nodule    COVID-19 01/2021   Crohn's disease (HCC)    Stomach, terminal ileum, cecum   Dyspnea    due to nasal /sinus congestion   Gastric ulcer    antral   GERD (gastroesophageal reflux disease)    History of kidney stones    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   History of migraine headaches    HTN (hypertension)    Kidney stone 09/20/2012   Migraine    Obesity    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    Pneumonia 02/27/2014   ED   Postsurgical dumping syndrome 02/14/2019   Sleep apnea    has cpap- does not wear    Past Surgical History:  Procedure Laterality Date   BALLOON DILATION N/A 12/06/2017   Procedure: BALLOON DILATION;  Surgeon: Iva Boop, MD;  Location: [facility];  Service: Endoscopy;  Laterality: N/A;   BIOPSY  06/21/2018   Procedure: BIOPSY;  Surgeon: Iva Boop, MD;  Location: [facility];  Service: Endoscopy;;   CHOLECYSTECTOMY  2011   [facility]   CIRCUMCISION N/A 12/09/2018   Procedure: CIRCUMCISION ADULT;  Surgeon: Crista Elliot, MD;  Location: [facility];  Service: Urology;  Laterality: N/A;   COLONOSCOPY  07/26/11   Crohn's colitis, ileitis   ESOPHAGOGASTRODUODENOSCOPY  05/04/11, 07/26/11   granulomatous gastritis - Crohn's   ESOPHAGOGASTRODUODENOSCOPY (EGD) WITH  PROPOFOL N/A 03/28/2017   Procedure: ESOPHAGOGASTRODUODENOSCOPY (EGD) WITH PROPOFOL  with balloon dilation of duodenum;  Surgeon: Benancio Deeds, MD;  [facility];  Service: Gastroenterology;  Laterality: N/A;   ESOPHAGOGASTRODUODENOSCOPY (EGD) WITH PROPOFOL N/A 12/06/2017   Procedure: ESOPHAGOGASTRODUODENOSCOPY (EGD) WITH PROPOFOL;  Surgeon: Iva Boop, MD;  [facility];  Service: Endoscopy;  Laterality: N/A;   ESOPHAGOGASTRODUODENOSCOPY (EGD) WITH PROPOFOL N/A 06/21/2018   Procedure: ESOPHAGOGASTRODUODENOSCOPY (EGD) WITH PROPOFOL;  Surgeon: Iva Boop, MD;  [facility];  Service: Endoscopy;  Laterality: N/A;   ETHMOIDECTOMY N/A 11/27/2018   Procedure: ENDOSCOPIC TOTAL ETHMOIDECTOMY;  Surgeon: Newman Pies, MD;  [facility];  Service: ENT;  Laterality: N/A;   FLEXIBLE SIGMOIDOSCOPY     GASTROJEJUNOSTOMY  01/2018   MULTIPLE TOOTH EXTRACTIONS  2005   NASAL SEPTOPLASTY W/ TURBINOPLASTY Bilateral 11/27/2018   Procedure: NASAL SEPTOPLASTY WITH BILATERAL TURBINATE REDUCTION;  Surgeon: Newman Pies, MD;  [facility];  Service: ENT;  Laterality: Bilateral;   SHOULDER SURGERY     right   SINUS ENDO WITH FUSION N/A 11/27/2018   Procedure: SINUS ENDO WITH FUSION;  Surgeon: Newman Pies, MD;  [facility];  Service: ENT;  Laterality: N/A;   UPPER GASTROINTESTINAL ENDOSCOPY  VASECTOMY     bilateral w/lysis of penile adhesions   Patient Active Problem List   Diagnosis Date Noted   Low back pain 06/05/2023   Seasonal and perennial allergic rhinitis 09/07/2022   Allergic conjunctivitis of both eyes 09/07/2022   Feeding problems 12/02/2021   Abdominal migraine, not intractable ? 03/21/2019   Postsurgical dumping syndrome 02/14/2019   S/P functional endoscopic sinus surgery 11/27/2018   Abdominal pain, epigastric    Angioedema 04/19/2018   Intractable vomiting with nausea    Cigarette nicotine dependence without complication 05/15/2016   Shifting  sleep-work schedule, affecting sleep 05/15/2016   Sleep deprivation 05/15/2016   Sleep related hypoventilation in conditions classified elsewhere 05/15/2016   Sleep related headaches 05/15/2016   Morbid obesity (HCC) 09/19/2013   Diarrhea 07/10/2013   Colloid thyroid nodule 04/04/2013   Elevated blood pressure reading in office with diagnosis of hypertension 11/26/2012   Gastroparesis??? 12/08/2011   Common migraine without aura 12/08/2011   Gastroduodenal Crohn's disease (HCC) 05/17/2011   GERD (gastroesophageal reflux disease) 04/21/2011    PCP: [name], MD   REFERRING PROVIDER: Persons, [place], [place]   REFERRING DIAG:    M54.40 (ICD-10-CM) - Acute bilateral low back pain with sciatica, sciatica laterality unspecified    Rationale for Evaluation and Treatment: Rehabilitation  THERAPY DIAG:  Other low back pain  Muscle weakness (generalized)  Difficulty in walking, not elsewhere classified  ONSET DATE: 6 month history of back pain  SUBJECTIVE:                                                                                                                                                                                           SUBJECTIVE STATEMENT: Pt indicated his back is doing good today but his left hip continues to bother him. He saw PA today for this and will have injection later today  PERTINENT HISTORY:  Chrohns disease, obesity, sleep apnea  PAIN:  NPRS scale: 2-3/10 back Pain location:left hip Pain description: constant, achy, sharp Aggravating factors: putting on socks and shoes. Standing, walking, sitting straight up Relieving factors: leaning back   PRECAUTIONS: None  RED FLAGS: None   WEIGHT BEARING RESTRICTIONS: No  FALLS:  Has patient fallen in last 6 months? Yes. Number of falls 1, slipped on last step on clothes  OCCUPATION: disabillity  PLOF: Independent with basic ADLS  PATIENT GOALS: reduce the pain, loose weight  NEXT  MD VISIT: 07/31/23  OBJECTIVE:  Note: Objective measures were completed at Evaluation unless otherwise noted.  DIAGNOSTIC FINDINGS:  06/20/2023 review:  XR IMPRESSION: Mild bilateral hip osteoarthritis. "Multilevel degenerative disc disease, most prominent  at L3-L4 and L4-L5. Mild thoracic degenerative spurring"  PATIENT SURVEYS:  Eval FOTO 26% functional, goal is 47%  COGNITION: 06/20/2023 Overall cognitive status: Within functional limits for tasks assessed     SENSATION: 06/20/2023 [facility]  MUSCLE LENGTH: 06/20/2023 Hamstrings and paraspinals tight bilat  LUMBAR ROM:   AROM 06/20/2023 07/03/2023 07/31/23  Flexion 50% and pain worse    Extension 75% and pain improves 75% WFL   Right lateral flexion     Left lateral flexion     Right rotation 50%     Left rotation 50% and pain     (Blank rows = not tested)  LOWER EXTREMITY MMT:    MMT Right 06/20/2023 Left 06/20/2023  Hip flexion 4 4  Hip extension    Hip abduction 4 4  Hip adduction    Hip internal rotation    Hip external rotation    Knee flexion 5 5  Knee extension 5 5  Ankle dorsiflexion    Ankle plantarflexion    Ankle inversion    Ankle eversion     (Blank rows = not tested)  LUMBAR SPECIAL TESTS:  06/20/2023 Slump test: Negative  FUNCTIONAL TESTS:  06/20/2023 Eval: Timed up and go (TUG): 16:16  GAIT: 06/20/2023 Comments: antalgic gait with wide BOS, slow gait velocity                     TODAY'S TREATMENT:                                                     DATE:  07/31/2023  Therex Nustep Lvl 3 UE/LE for aerobic exercise/mobility 9 mins Seated row machine 45# 2X15 Seated lat pulldown machine 45 #2X15 Seated chest press machine 25# 2X15 Seated pball press down ab activation 5 sec hold x 10 submax Seated lumbar flexion stretch with ball 5 sec  X10 Standing lumbar extension AROM X10 with pball roll up wall 5 sec X 10 Standing Lumbar L stretch 5 sec  X10 Standing hip abductions  X15  bilat Standing hip extension X 15 bilat   DATE: 07/27/23 Pt seen for aquatic therapy today.  Treatment took place in water 3.5-4.75 ft in depth at the [facility] pool. Temp of water was 91.  Pt entered/exited the pool via stairs with hand rail.   Pt requires the buoyancy and hydrostatic pressure of water for support, and to offload joints by unweighting joint load by at least 50 % in navel deep water and by at least 75-80% in chest to neck deep water.  Viscosity of the water is needed for resistance of strengthening. Water current perturbations provides challenge to standing balance requiring increased core activation. Aquatic PT exercises Forward walking and backward walking 4 round trips, no UE support Sidestepping 4 round trips Tandem walk 3 round trips with UE support of noodle, then 1 trip without noodle Lumbar flexion L stretch 5 sec X 10 holding at pool wall Leg swings hip abd/add X20 bilat with UE support with UE support Leg swings hip flexion/extension X 20 bilat Push/pull with kickboard 2 X 15 in lunge stance  Shoulder extension with kickboard X 20 Shoulder adduction/abduction X 15 bilat with water dumbells Shoulder push/pull X 20 bilat with water dumbells Stir the pot with single water dumbell X 15 Clockwise, X 15 CCW. Monster walks  3 round trips Horizontal abd/add with dumbells  X20 Seated on noodle bicycle X 5 min for spinal decompression     PATIENT EDUCATION: Education details: HEP, PT plan of care, weight loss strategies Person educated: Patient Education method: Explanation, Demonstration, Verbal cues, and Handouts Education comprehension: verbalized understanding and needs further education   HOME EXERCISE PROGRAM: Access Code: [email or web address] URL: [email or web address] Date: 06/20/2023 Prepared by: Ivery Quale  Exercises - Standing Lumbar Extension  - 2 x daily - 6 x weekly - 2 sets - 10 reps - 5 hold - Scapular Retraction with Resistance   - 2 x daily - 6 x weekly - 2 sets - 15 reps - Shoulder extension with resistance - Neutral  - 2 x daily - 6 x weekly - 2 sets - 15 reps - Standing Hip Extension with Counter Support  - 2 x daily - 6 x weekly - 1 sets - 15 reps - Standing 'L' Stretch at Counter  - 2 x daily - 6 x weekly - 1 sets - 10 reps - 5 hold - Supine Lower Trunk Rotation  - 2 x daily - 6 x weekly - 1 sets - 5 reps - 10 sec hold  ASSESSMENT:  CLINICAL IMPRESSION: He is improving with back pain but has been limited more with left hip pain so he will get injection for this today.    OBJECTIVE IMPAIRMENTS: decreased activity tolerance, difficulty walking, decreased balance, decreased endurance, decreased mobility, decreased ROM, decreased strength, impaired flexibility, impaired LE use, postural dysfunction, and pain.  ACTIVITY LIMITATIONS: bending, lifting, carry, locomotion, cleaning, community activity,   PERSONAL FACTORS: see above PMH are also affecting patient's functional outcome.  REHAB POTENTIAL: Good  CLINICAL DECISION MAKING: Stable/uncomplicated  EVALUATION COMPLEXITY: Low    GOALS: Short term PT Goals Target date: 07/18/2023   Pt will be I and compliant with HEP. Baseline:  Goal status: New Pt will decrease pain by 25% overall Baseline: Goal status: New  Long term PT goals Target date:08/15/2023   Pt will improve lumbar AROM to [facility] to improve functional mobility Baseline: Goal status: New Pt will improve  hip/knee strength to at least 4+/5 MMT to improve functional strength Baseline: Goal status: New Pt will improve FOTO to at least 47 % functional to show improved function Baseline: Goal status: New Pt will reduce pain by overall 50% so less than 5/10 overall with usual activity Baseline:9.5/10 Goal status: Ne Pt will improve TUG time to less than 13 seconds to show improved gait speed and sit to stand ability.  Baseline: Goal status: New  PLAN: PT FREQUENCY: 1-2 times per week    PT DURATION: 6-8 weeks  PLANNED INTERVENTIONS (unless contraindicated): aquatic PT, Canalith repositioning, cryotherapy, Electrical stimulation, Iontophoresis with 4 mg/ml dexamethasome, Moist heat, traction, Ultrasound, gait training, Therapeutic exercise, balance training, neuromuscular re-education, patient/family education, prosthetic training, manual techniques, passive ROM, dry needling, taping, vasopnuematic device, vestibular, spinal manipulations, joint manipulations 97110-Therapeutic exercises, 97530- Therapeutic activity, [id number]- Neuromuscular re-education, 97535- Self Care, 13086- Manual therapy, [id number]- Gait training, [id number]- Aquatic Therapy, [phone]- Electrical stimulation (manual), and Dry Needling  PLAN FOR NEXT SESSION: mix on land and water visits, core strength and hip exercises as tolerated. General strength and conditioning progressions as tolerated.  Ivery Quale, PT, DPT 07/31/23 12:21 PM     Date of referral: 06/05/23 Referring provider: Persons, [place], [place]  Referring diagnosis? M54.40 (ICD-10-CM) - Acute bilateral low back pain with sciatica, sciatica laterality unspecified Treatment diagnosis? (if different than referring  diagnosis) m54.59  What was this (referring dx) caused by? Ongoing Issue and Arthritis  Ashby Dawes of Condition: Chronic (continuous duration > 3 months)   Laterality: Both  Current Functional Measure Score: FOTO 26% functional intake  Objective measurements identify impairments when they are compared to normal values, the uninvolved extremity, and prior level of function.  [x]  Yes  []  No  Objective assessment of functional ability: Severe functional limitations   Briefly describe symptoms: chronic back pain that limits his ability for standing and walking  How did symptoms start: unspecified, insidious onset  Average pain intensity:  Last 24 hours: 9.5/10  Past week: 03/07/09  How often does the pt experience symptoms? Constantly  How  much have the symptoms interfered with usual daily activities? Extremely  How has condition changed since care began at this facility? NA - initial visit  In general, how is the patients overall health? Poor   BACK PAIN (STarT Back Screening Tool) No, did FOTO instead

## 2023-07-31 NOTE — Progress Notes (Signed)
   Procedure Note  Patient: Craig Guzman.             Date of Birth: 02/24/1971           MRN: [id number]             Visit Date: 07/31/2023  Procedures: Visit Diagnoses:  1. Pain of left hip   2. Unilateral primary osteoarthritis, left hip    Large Joint Inj: L hip joint on 07/31/2023 2:07 PM Indications: pain Details: 22 G 3.5 in needle, ultrasound-guided anterior approach Medications: 4 mL lidocaine 1 %; 80 mg methylPREDNISolone acetate 40 MG/ML Outcome: tolerated well, no immediate complications  Procedure: US-guided intra-articular hip injection, left After discussion on risks/benefits/indications and informed verbal consent was obtained, a timeout was performed. Patient was lying supine on exam table. The hip was cleaned with betadine and alcohol swabs. Then utilizing ultrasound guidance, the patient's femoral head and neck junction was identified and subsequently injected with 4:2 lidocaine:depomedrol via an in-plane approach with ultrasound visualization of the injectate administered into the hip joint. Patient tolerated procedure well without immediate complications.  Procedure, treatment alternatives, risks and benefits explained, specific risks discussed. Consent was given by the patient. Immediately prior to procedure a time out was called to verify the correct patient, procedure, equipment, support staff and site/side marked as required. Patient was prepped and draped in the usual sterile fashion.     - follow-up with Mary-Anne Persons as indicated; I am happy to see them as needed  Madelyn Brunner, DO Primary Care Sports Medicine Physician  [facility] - Orthopedics  This note was dictated using Dragon naturally speaking software and may contain errors in syntax, spelling, or content which have not been identified prior to signing this note.

## 2023-07-31 NOTE — Progress Notes (Signed)
Office Visit Note   Patient: Craig Guzman.           Date of Birth: February 04, 1971           MRN: [id number] Visit Date: 07/31/2023              Requested by: [name], MD [address] [place],  [place] [address] PCP: [name], MD  Chief Complaint  Patient presents with   Left Hip - Pain      HPI: Patient comes in today complaining of left groin pain.  He does have a history of some arthritis in his left hip.  He has been working with physical therapy on his back and he actually feels like that is gotten better.  He denies any radicular symptoms going down his left leg pain is increased with internal/external rotation of his hip  Assessment & Plan: Visit Diagnoses: Osteoarthritis left hip.  Plan: Talked to the patient today.  His symptoms and exam findings are more consistent with left hip pain.  Talked him about the options.  Has had steroid injections in the past not in his hip but these seem to have helped him.  He is not diabetic.  I have given him the option and discussed this with Dr. Shon Baton that he could come back this afternoon and try ultrasound-guided injection into the left hip.  This would be both diagnostic and hopefully therapeutic  Follow-Up Instructions: No follow-ups on file.   Ortho Exam  Patient is alert, oriented, no adenopathy, well-dressed, normal affect, normal respiratory effort. He is neurovascularly intact he has pain with logrolling of his hip.  Pain is focused in the groin and lateral groin.  No radicular findings he is neurovascularly intact pain with flexion of his hip reproduces the in the groin  Imaging: No results found. No images are attached to the encounter.  Labs: Lab Results  Component Value Date   HGBA1C 6.1 06/25/2023   HGBA1C 5.9 10/02/2022   HGBA1C 5.9 03/31/2022   ESRSEDRATE 69 (H) 09/23/2019   ESRSEDRATE 25 (H) 03/27/2017   ESRSEDRATE 17 05/19/2011   CRP <0.8 03/27/2017   CRP 0.6 09/25/2012   CRP 1  12/08/2011   REPTSTATUS 03/28/2017 FINAL 03/27/2017   CULT  03/27/2017    NO GROWTH Performed at [facility], [address] [place], [place] [address]    [facility] ESCHERICHIA COLI 02/10/2015     Lab Results  Component Value Date   ALBUMIN 3.6 06/25/2023   ALBUMIN 4.0 10/02/2022   ALBUMIN 3.9 03/31/2022    Lab Results  Component Value Date   MG 2.1 03/27/2017   MG 1.8 06/26/2016   Lab Results  Component Value Date   VD25OH 14.53 (L) 06/25/2023   VD25OH 24.09 (L) 10/02/2022   VD25OH 11.94 (L) 08/22/2022    No results found for: "PREALBUMIN"    Latest Ref Rng & Units 08/22/2022    9:59 AM 10/20/2020    9:28 AM 09/23/2019    4:49 PM  CBC EXTENDED  WBC 4.0 - 10.5 K/uL 5.3  4.9  5.3   RBC 4.22 - 5.81 Mil/uL 5.19  5.16  5.41   Hemoglobin 13.0 - 17.0 g/dL 32.4  40.1  02.7   HCT 39.0 - 52.0 % 40.9  41.2  43.7   Platelets 150.0 - 400.0 K/uL 199.0  241.0  231.0   NEUT# 1.4 - 7.7 K/uL 2.6   2.4   Lymph# 0.7 -  4.0 K/uL 1.6   2.0      There is no height or weight on file to calculate BMI.  Orders:  No orders of the defined types were placed in this encounter.  No orders of the defined types were placed in this encounter.    Procedures: No procedures performed  Clinical Data: No additional findings.  ROS:  All other systems negative, except as noted in the HPI. Review of Systems  Objective: Vital Signs: There were no vitals taken for this visit.  Specialty Comments:  No specialty comments available.  PMFS History: Patient Active Problem List   Diagnosis Date Noted   Low back pain 06/05/2023   Seasonal and perennial allergic rhinitis 09/07/2022   Allergic conjunctivitis of both eyes 09/07/2022   Feeding problems 12/02/2021   Abdominal migraine, not intractable ? 03/21/2019   Postsurgical dumping syndrome 02/14/2019   S/P functional endoscopic sinus surgery 11/27/2018   Abdominal pain, epigastric    Angioedema 04/19/2018   Intractable vomiting  with nausea    Cigarette nicotine dependence without complication 05/15/2016   Shifting sleep-work schedule, affecting sleep 05/15/2016   Sleep deprivation 05/15/2016   Sleep related hypoventilation in conditions classified elsewhere 05/15/2016   Sleep related headaches 05/15/2016   Morbid obesity (HCC) 09/19/2013   Diarrhea 07/10/2013   Colloid thyroid nodule 04/04/2013   Elevated blood pressure reading in office with diagnosis of hypertension 11/26/2012   Gastroparesis??? 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   History of migraine headaches    HTN (hypertension)    Kidney stone 09/20/2012   Migraine    Obesity    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    Pneumonia 02/27/2014   ED   Postsurgical dumping syndrome 02/14/2019   Sleep apnea    has cpap- does not wear     Family History  Problem Relation Age of Onset   Colon polyps Father    Diabetes Father    Hypertension Father    Hypertension Mother    Asthma Mother    Heart disease Maternal Grandmother    Colon cancer Neg Hx    Rectal cancer Neg Hx    Stomach cancer Neg Hx    Esophageal cancer Neg Hx     Past Surgical History:  Procedure Laterality Date   BALLOON DILATION N/A 12/06/2017   Procedure: BALLOON DILATION;  Surgeon: Iva Boop, MD;  Location: [facility];  Service: Endoscopy;  Laterality: N/A;   BIOPSY  06/21/2018   Procedure: BIOPSY;  Surgeon: Iva Boop, MD;   Location: [facility];  Service: Endoscopy;;   CHOLECYSTECTOMY  2011   [facility]   CIRCUMCISION N/A 12/09/2018   Procedure: CIRCUMCISION ADULT;  Surgeon: Crista Elliot, MD;  Location: [facility];  Service: Urology;  Laterality: N/A;   COLONOSCOPY  07/26/11   Crohn's colitis, ileitis   ESOPHAGOGASTRODUODENOSCOPY  05/04/11, 07/26/11   granulomatous  gastritis - Crohn's   ESOPHAGOGASTRODUODENOSCOPY (EGD) WITH PROPOFOL N/A 03/28/2017   Procedure: ESOPHAGOGASTRODUODENOSCOPY (EGD) WITH PROPOFOL  with balloon dilation of duodenum;  Surgeon: Benancio Deeds, MD;  Location: [facility];  Service: Gastroenterology;  Laterality: N/A;   ESOPHAGOGASTRODUODENOSCOPY (EGD) WITH PROPOFOL N/A 12/06/2017   Procedure: ESOPHAGOGASTRODUODENOSCOPY (EGD) WITH PROPOFOL;  Surgeon: Iva Boop, MD;  Location: [facility];  Service: Endoscopy;  Laterality: N/A;   ESOPHAGOGASTRODUODENOSCOPY (EGD) WITH PROPOFOL N/A 06/21/2018   Procedure: ESOPHAGOGASTRODUODENOSCOPY (EGD) WITH PROPOFOL;  Surgeon: Iva Boop, MD;  Location: [facility];  Service: Endoscopy;  Laterality: N/A;   ETHMOIDECTOMY N/A 11/27/2018   Procedure: ENDOSCOPIC TOTAL ETHMOIDECTOMY;  Surgeon: Newman Pies, MD;  Location: [facility];  Service: ENT;  Laterality: N/A;   FLEXIBLE SIGMOIDOSCOPY     GASTROJEJUNOSTOMY  01/2018   MULTIPLE TOOTH EXTRACTIONS  2005   NASAL SEPTOPLASTY W/ TURBINOPLASTY Bilateral 11/27/2018   Procedure: NASAL SEPTOPLASTY WITH BILATERAL TURBINATE REDUCTION;  Surgeon: Newman Pies, MD;  Location: [facility];  Service: ENT;  Laterality: Bilateral;   SHOULDER SURGERY     right   SINUS ENDO WITH FUSION N/A 11/27/2018   Procedure: SINUS ENDO WITH FUSION;  Surgeon: Newman Pies, MD;  Location: [facility];  Service: ENT;  Laterality: N/A;   UPPER GASTROINTESTINAL ENDOSCOPY     VASECTOMY     bilateral w/lysis of penile adhesions   Social History   Occupational History   Occupation: Set designer: [employer]  Tobacco Use   Smoking status: Former     Current packs/day: 0.00    Average packs/day: 0.8 packs/day for 21.0 years (15.8 ttl pk-yrs)    Types: Cigarettes    Start date: 12/01/1996    Quit date: 12/01/2017    Years since quitting: 5.6    Passive exposure: Current (once in a while)   Smokeless tobacco: Former    Types: Snuff   Tobacco comments:    Counseling sheet given in exam room   Vaping Use   Vaping status: Never Used  Substance and Sexual Activity   Alcohol use: Not Currently   Drug use: No   Sexual activity: Yes    Partners: Female

## 2023-08-02 ENCOUNTER — Encounter: Payer: Medicare Other | Admitting: Physical Therapy

## 2023-08-09 ENCOUNTER — Other Ambulatory Visit: Payer: Self-pay | Admitting: Family Medicine

## 2023-08-09 DIAGNOSIS — I10 Essential (primary) hypertension: Secondary | ICD-10-CM

## 2023-08-10 ENCOUNTER — Other Ambulatory Visit: Payer: Self-pay | Admitting: Family Medicine

## 2023-08-10 ENCOUNTER — Ambulatory Visit (HOSPITAL_BASED_OUTPATIENT_CLINIC_OR_DEPARTMENT_OTHER): Payer: Medicare Other | Admitting: Physical Therapy

## 2023-08-10 ENCOUNTER — Encounter: Payer: Self-pay | Admitting: Physical Therapy

## 2023-08-10 DIAGNOSIS — M6281 Muscle weakness (generalized): Secondary | ICD-10-CM

## 2023-08-10 DIAGNOSIS — R351 Nocturia: Secondary | ICD-10-CM

## 2023-08-10 DIAGNOSIS — M5459 Other low back pain: Secondary | ICD-10-CM

## 2023-08-10 DIAGNOSIS — R262 Difficulty in walking, not elsewhere classified: Secondary | ICD-10-CM

## 2023-08-10 DIAGNOSIS — I10 Essential (primary) hypertension: Secondary | ICD-10-CM

## 2023-08-10 NOTE — Therapy (Addendum)
 OUTPATIENT PHYSICAL THERAPY TREATMENT/discharge PHYSICAL THERAPY DISCHARGE SUMMARY  Visits from Start of Care: 9  Current functional level related to goals / functional outcomes: See below   Remaining deficits: See below   Education / Equipment: HEP  Plan:  Patient goals were not all met. Patient is being discharged due to not returning since last visit >30 days  Craig Guzman, PT, DPT 10/15/23 3:11 PM     Patient Name: Craig Guzman. MRN: [id number] DOB:[birth year], 53 y.o., male Today's Date: 08/10/2023  END OF SESSION:  PT End of Session - 08/10/23 1056     Visit Number 9    Number of Visits 12    Date for PT Re-Evaluation 08/15/23    Authorization Type [employer] MCR    PT Start Time 1050    PT Stop Time 1130    PT Time Calculation (min) 40 min    Activity Tolerance Patient tolerated treatment well    Behavior During Therapy WFL for tasks assessed/performed                Past Medical History:  Diagnosis Date   Allergy     trees/ grasses/ animals/dust/mold   Biliary dyskinesia    Colloid thyroid  nodule    COVID-19 01/2021   Crohn's disease (HCC)    Stomach, terminal ileum, cecum   Dyspnea    due to nasal /sinus congestion   Gastric ulcer    antral   GERD (gastroesophageal reflux disease)    History of kidney stones    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   History of migraine headaches    HTN (hypertension)    Kidney stone 09/20/2012   Migraine    Obesity    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    Pneumonia 02/27/2014   ED   Postsurgical dumping syndrome 02/14/2019   Sleep apnea    has cpap- does not wear    Past Surgical History:  Procedure Laterality Date   BALLOON DILATION N/A 12/06/2017   Procedure: BALLOON DILATION;  Surgeon: Avram Lupita BRAVO, MD;  Location: [facility];  Service: Endoscopy;  Laterality: N/A;   BIOPSY  06/21/2018   Procedure: BIOPSY;  Surgeon: Avram Lupita BRAVO, MD;  Location: [facility];  Service:  Endoscopy;;   CHOLECYSTECTOMY  2011   [facility]   CIRCUMCISION N/A 12/09/2018   Procedure: CIRCUMCISION ADULT;  Surgeon: Carolee Sherwood JONETTA DOUGLAS, MD;  [facility]: [facility];  Service: Urology;  Laterality: N/A;   COLONOSCOPY  07/26/11   Crohn's colitis, ileitis   ESOPHAGOGASTRODUODENOSCOPY  05/04/11, 07/26/11   granulomatous gastritis - Crohn's   ESOPHAGOGASTRODUODENOSCOPY (EGD) WITH PROPOFOL  N/A 03/28/2017   Procedure: ESOPHAGOGASTRODUODENOSCOPY (EGD) WITH PROPOFOL   with balloon dilation of duodenum;  Surgeon: Leigh Elspeth SQUIBB, MD;  [facility];  Service: Gastroenterology;  Laterality: N/A;   ESOPHAGOGASTRODUODENOSCOPY (EGD) WITH PROPOFOL  N/A 12/06/2017   Procedure: ESOPHAGOGASTRODUODENOSCOPY (EGD) WITH PROPOFOL ;  Surgeon: Avram Lupita BRAVO, MD;  [facility];  Service: Endoscopy;  Laterality: N/A;   ESOPHAGOGASTRODUODENOSCOPY (EGD) WITH PROPOFOL  N/A 06/21/2018   Procedure: ESOPHAGOGASTRODUODENOSCOPY (EGD) WITH PROPOFOL ;  Surgeon: Avram Lupita BRAVO, MD;  [facility];  Service: Endoscopy;  Laterality: N/A;   ETHMOIDECTOMY N/A 11/27/2018   Procedure: ENDOSCOPIC TOTAL ETHMOIDECTOMY;  Surgeon: Karis Clunes, MD;  [facility];  Service: ENT;  Laterality: N/A;   FLEXIBLE SIGMOIDOSCOPY     GASTROJEJUNOSTOMY  01/2018   MULTIPLE TOOTH EXTRACTIONS  2005   NASAL SEPTOPLASTY W/ TURBINOPLASTY Bilateral 11/27/2018   Procedure:  NASAL SEPTOPLASTY WITH BILATERAL TURBINATE REDUCTION;  Surgeon: Karis Clunes, MD;  [facility];  Service: ENT;  Laterality: Bilateral;   SHOULDER SURGERY     right   SINUS ENDO WITH FUSION N/A 11/27/2018   Procedure: SINUS ENDO WITH FUSION;  Surgeon: Karis Clunes, MD;  Location: [facility];  Service: ENT;  Laterality: N/A;   UPPER GASTROINTESTINAL ENDOSCOPY     VASECTOMY     bilateral w/lysis of penile adhesions   Patient Active Problem List   Diagnosis Date Noted   Low back pain 06/05/2023   Seasonal and perennial allergic rhinitis 09/07/2022   Allergic conjunctivitis  of both eyes 09/07/2022   Feeding problems 12/02/2021   Abdominal migraine, not intractable ? 03/21/2019   Postsurgical dumping syndrome 02/14/2019   S/P functional endoscopic sinus surgery 11/27/2018   Abdominal pain, epigastric    Angioedema 04/19/2018   Intractable vomiting with nausea    Cigarette nicotine dependence without complication 05/15/2016   Shifting sleep-work schedule, affecting sleep 05/15/2016   Sleep deprivation 05/15/2016   Sleep related hypoventilation in conditions classified elsewhere 05/15/2016   Sleep related headaches 05/15/2016   Morbid obesity (HCC) 09/19/2013   Diarrhea 07/10/2013   Colloid thyroid  nodule 04/04/2013   Elevated blood pressure reading in office with diagnosis of hypertension 11/26/2012   Gastroparesis??? 12/08/2011   Common migraine without aura 12/08/2011   Gastroduodenal Crohn's disease (HCC) 05/17/2011   GERD (gastroesophageal reflux disease) 04/21/2011    PCP: Levora Reyes SAUNDERS, MD   REFERRING PROVIDER: Levora Reyes SAUNDERS, MD   REFERRING DIAG:    M54.40 (ICD-10-CM) - Acute bilateral low back pain with sciatica, sciatica laterality unspecified    Rationale for Evaluation and Treatment: Rehabilitation  THERAPY DIAG:  Other low back pain  Muscle weakness (generalized)  Difficulty in walking, not elsewhere classified  ONSET DATE: 6 month history of back pain  SUBJECTIVE:                                                                                                                                                                                           SUBJECTIVE STATEMENT: Pt indicated no back pain upon arrival, he had hip injection but relays it did not really help  PERTINENT HISTORY:  Chrohns disease, obesity, sleep apnea  PAIN:  NPRS scale: 0 in back today Pain location:left hip Pain description: constant, achy, sharp Aggravating factors: putting on socks and shoes. Standing, walking, sitting straight  up Relieving factors: leaning back   PRECAUTIONS: None  RED FLAGS: None   WEIGHT BEARING RESTRICTIONS: No  FALLS:  Has patient fallen in last 6 months? Yes. Number of falls  1, slipped on last step on clothes  OCCUPATION: disabillity  PLOF: Independent with basic ADLS  PATIENT GOALS: reduce the pain, loose weight  NEXT MD VISIT: 07/31/23  OBJECTIVE:  Note: Objective measures were completed at Evaluation unless otherwise noted.  DIAGNOSTIC FINDINGS:  06/20/2023 review:  XR IMPRESSION: Mild bilateral hip osteoarthritis. Multilevel degenerative disc disease, most prominent at L3-L4 and L4-L5. Mild thoracic degenerative spurring  PATIENT SURVEYS:  Eval FOTO 26% functional, goal is 47%  COGNITION: 06/20/2023 Overall cognitive status: Within functional limits for tasks assessed     SENSATION: 06/20/2023 [facility]  MUSCLE LENGTH: 06/20/2023 Hamstrings and paraspinals tight bilat  LUMBAR ROM:   AROM 06/20/2023 07/03/2023 07/31/23  Flexion 50% and pain worse    Extension 75% and pain improves 75% WFL   Right lateral flexion     Left lateral flexion     Right rotation 50%     Left rotation 50% and pain     (Blank rows = not tested)  LOWER EXTREMITY MMT:    MMT Right 06/20/2023 Left 06/20/2023  Hip flexion 4 4  Hip extension    Hip abduction 4 4  Hip adduction    Hip internal rotation    Hip external rotation    Knee flexion 5 5  Knee extension 5 5  Ankle dorsiflexion    Ankle plantarflexion    Ankle inversion    Ankle eversion     (Blank rows = not tested)  LUMBAR SPECIAL TESTS:  06/20/2023 Slump test: Negative  FUNCTIONAL TESTS:  06/20/2023 Eval: Timed up and go (TUG): 16:16  GAIT: 06/20/2023 Comments: antalgic gait with wide BOS, slow gait velocity                     TODAY'S TREATMENT:                                                     DATE:  DATE: 08/10/23 Pt seen for aquatic therapy today.  Treatment took place in water 3.5-4.75 ft in  depth at the [facility] pool. Temp of water was 91.  Pt entered/exited the pool via stairs with hand rail.   Pt requires the buoyancy and hydrostatic pressure of water for support, and to offload joints by unweighting joint load by at least 50 % in navel deep water and by at least 75-80% in chest to neck deep water.  Viscosity of the water is needed for resistance of strengthening. Water current perturbations provides challenge to standing balance requiring increased core activation. Aquatic PT exercises Forward walking and backward walking 4 round trips, no UE support Sidestepping 4 round trips Tandem walk 3 round trips with UE support of noodle, then 1 trip without noodle Lumbar flexion L stretch 5 sec X 10 holding at pool wall Leg swings hip abd/add X20 bilat with UE support with UE support Leg swings hip flexion/extension X 20 bilat Push/pull with kickboard 2 X 15 in lunge stance  Shoulder extension with kickboard X 20 Shoulder adduction/abduction X 15 bilat with water dumbells Shoulder push/pull X 20 bilat with water dumbells Stir the pot with single water dumbell X 15 Clockwise, X 15 CCW. Monster walks 3 round trips Horizontal abd/add with dumbells  X20 Seated on noodle bicycle X 5 min for spinal decompression  07/31/2023  Therex Nustep Lvl 3  UE/LE for aerobic exercise/mobility 9 mins Seated row machine 45# 2X15 Seated lat pulldown machine 45 #2X15 Seated chest press machine 25# 2X15 Seated pball press down ab activation 5 sec hold x 10 submax Seated lumbar flexion stretch with ball 5 sec  X10 Standing lumbar extension AROM X10 with pball roll up wall 5 sec X 10 Standing Lumbar L stretch 5 sec  X10 Standing hip abductions  X15 bilat Standing hip extension X 15 bila     PATIENT EDUCATION: Education details: HEP, PT plan of care, weight loss strategies Person educated: Patient Education method: Explanation, Demonstration, Verbal cues, and Handouts Education  comprehension: verbalized understanding and needs further education   HOME EXERCISE PROGRAM: Access Code: [email or web address] URL: [email or web address] Date: 06/20/2023 Prepared by: Craig Guzman  Exercises - Standing Lumbar Extension  - 2 x daily - 6 x weekly - 2 sets - 10 reps - 5 hold - Scapular Retraction with Resistance  - 2 x daily - 6 x weekly - 2 sets - 15 reps - Shoulder extension with resistance - Neutral  - 2 x daily - 6 x weekly - 2 sets - 15 reps - Standing Hip Extension with Counter Support  - 2 x daily - 6 x weekly - 1 sets - 15 reps - Standing 'L' Stretch at Counter  - 2 x daily - 6 x weekly - 1 sets - 10 reps - 5 hold - Supine Lower Trunk Rotation  - 2 x daily - 6 x weekly - 1 sets - 5 reps - 10 sec hold  Aquatic HEP Access Code: [email or web address] URL: [email or web address] Date: 08/10/2023 Prepared by: Craig Guzman  Exercises - Standing Hip Flexion Extension at [facility]  - 1 x daily - 2 x weekly - 15 reps - Standing Hip Abduction Adduction at Pool Wall  - 1 x daily - 2 x weekly - 20 reps - Forward Walking  - 1 x daily - 2 x weekly - 4 sets - Backward Walking  - 1 x daily - 2 x weekly - 4 sets - Side Stepping  - 2 x daily - 2 x weekly - 4 sets - 10 reps - Bilateral Shoulder Horizontal Abduction Adduction AROM  - 1 x daily - 2 x weekly - 4 sets - uplifting, shoulder flexion and extension yoga  - 1 x daily - 2 x weekly - 1 sets - 20 reps - Forward Monster Walk  - 1 x daily - 2 x weekly - 4 sets - Standing 'L' Stretch at Counter  - 2 x daily - 6 x weekly - 1 sets - 10 reps - 5 hold  ASSESSMENT:  CLINICAL IMPRESSION: He was doing well with back pain, still limited with left hip pain and had recent injection for this which did not help. The aquatic PT does help improve exercise tolerance of left hip.     OBJECTIVE IMPAIRMENTS: decreased activity tolerance, difficulty walking, decreased balance, decreased endurance, decreased mobility, decreased ROM,  decreased strength, impaired flexibility, impaired LE use, postural dysfunction, and pain.  ACTIVITY LIMITATIONS: bending, lifting, carry, locomotion, cleaning, community activity,   PERSONAL FACTORS: see above PMH are also affecting patient's functional outcome.  REHAB POTENTIAL: Good  CLINICAL DECISION MAKING: Stable/uncomplicated  EVALUATION COMPLEXITY: Low    GOALS: Short term PT Goals Target date: 07/18/2023   Pt will be I and compliant with HEP. Baseline:  Goal status: New Pt will decrease pain by 25% overall Baseline: Goal status: New  Long term PT goals  Target date:08/15/2023   Pt will improve lumbar AROM to [facility] to improve functional mobility Baseline: Goal status: New Pt will improve  hip/knee strength to at least 4+/5 MMT to improve functional strength Baseline: Goal status: New Pt will improve FOTO to at least 47 % functional to show improved function Baseline: Goal status: New Pt will reduce pain by overall 50% so less than 5/10 overall with usual activity Baseline:9.5/10 Goal status: Ne Pt will improve TUG time to less than 13 seconds to show improved gait speed and sit to stand ability.  Baseline: Goal status: New  PLAN: PT FREQUENCY: 1-2 times per week   PT DURATION: 6-8 weeks  PLANNED INTERVENTIONS (unless contraindicated): aquatic PT, Canalith repositioning, cryotherapy, Electrical stimulation, Iontophoresis with 4 mg/ml dexamethasome, Moist heat, traction, Ultrasound, gait training, Therapeutic exercise, balance training, neuromuscular re-education, patient/family education, prosthetic training, manual techniques, passive ROM, dry needling, taping, vasopnuematic device, vestibular, spinal manipulations, joint manipulations 97110-Therapeutic exercises, 97530- Therapeutic activity, [id number]- Neuromuscular re-education, 97535- Self Care, [id number]- Manual therapy, [id number]- Gait training, [id number]- Aquatic Therapy, [phone]- Electrical stimulation (manual), and Dry  Needling  PLAN FOR NEXT SESSION:  Update measurements next visit mix on land and water visits, core strength and hip exercises as tolerated. General strength and conditioning progressions as tolerated.  Craig Guzman, PT, DPT 08/10/23 10:56 AM     Date of referral: 06/05/23 Referring provider: Levora Reyes SAUNDERS, MD  Referring diagnosis? M54.40 (ICD-10-CM) - Acute bilateral low back pain with sciatica, sciatica laterality unspecified Treatment diagnosis? (if different than referring diagnosis) m54.59  What was this (referring dx) caused by? Ongoing Issue and Arthritis  Lysle of Condition: Chronic (continuous duration > 3 months)   Laterality: Both  Current Functional Measure Score: FOTO 26% functional intake  Objective measurements identify impairments when they are compared to normal values, the uninvolved extremity, and prior level of function.  [x]  Yes  []  No  Objective assessment of functional ability: Severe functional limitations   Briefly describe symptoms: chronic back pain that limits his ability for standing and walking  How did symptoms start: unspecified, insidious onset  Average pain intensity:  Last 24 hours: 9.5/10  Past week: 03/07/09  How often does the pt experience symptoms? Constantly  How much have the symptoms interfered with usual daily activities? Extremely  How has condition changed since care began at this facility? NA - initial visit  In general, how is the patients overall health? Poor   BACK PAIN (STarT Back Screening Tool) No, did FOTO instead

## 2023-08-13 ENCOUNTER — Other Ambulatory Visit: Payer: Self-pay

## 2023-08-14 ENCOUNTER — Telehealth: Payer: Self-pay

## 2023-08-14 ENCOUNTER — Encounter: Payer: Self-pay | Admitting: Physician Assistant

## 2023-08-14 ENCOUNTER — Ambulatory Visit: Payer: Medicare Other | Admitting: Physician Assistant

## 2023-08-14 DIAGNOSIS — M1612 Unilateral primary osteoarthritis, left hip: Secondary | ICD-10-CM | POA: Diagnosis not present

## 2023-08-14 NOTE — Telephone Encounter (Signed)
Bilateral gel injectons

## 2023-08-14 NOTE — Progress Notes (Signed)
Office Visit Note   Patient: Craig Guzman.           Date of Birth: 1970-07-10           MRN: [id number] Visit Date: 08/14/2023              Requested by: [name], MD [address] [place],  [place] [address] PCP: [name], MD  Left hip pain    HPI: Patient is a pleasant 53 year old gentleman with a history of left hip pain and arthritis by x-ray.  Pain is focally in his groin.  He did have an injection with Dr. Shon Baton which she said did help him but following the injection he did go to a casino with his mother and is on his feet quite a bit.  Returns some of the pain.  Denies any radicular symptoms he also has done physical therapy which she believes helps quite a bit.  Assessment & Plan: Visit Diagnoses: Osteoarthritis left hip  Plan: I discussed the natural history of this.  He can get another injection but has to be at least 3 months.  In the meantime I recommend he continue to work on keeping his hip mobile and strong.  We did briefly discuss weight loss as at some point he may need hip replacement surgery and it would also help to delay this given his young age.  He cannot take anti-inflammatories but will continue to take arthritis medication Tylenol may follow-up with me as needed  Follow-Up Instructions: No follow-ups on file.   Ortho Exam  Patient is alert, oriented, no adenopathy, well-dressed, normal affect, normal respiratory effort. Left hip pain focally in the groin no radicular findings he has pain with external/internal rotation of the hip he is neurovascularly intact no signs of infection  Imaging: No results found. No images are attached to the encounter.  Labs: Lab Results  Component Value Date   HGBA1C 6.1 06/25/2023   HGBA1C 5.9 10/02/2022   HGBA1C 5.9 03/31/2022   ESRSEDRATE 69 (H) 09/23/2019   ESRSEDRATE 25 (H) 03/27/2017   ESRSEDRATE 17 05/19/2011   CRP <0.8 03/27/2017   CRP 0.6 09/25/2012   CRP 1 12/08/2011   REPTSTATUS  03/28/2017 FINAL 03/27/2017   CULT  03/27/2017    NO GROWTH Performed at [facility], [address], [place], [place] [address]    [facility] ESCHERICHIA COLI 02/10/2015     Lab Results  Component Value Date   ALBUMIN 3.6 06/25/2023   ALBUMIN 4.0 10/02/2022   ALBUMIN 3.9 03/31/2022    Lab Results  Component Value Date   MG 2.1 03/27/2017   MG 1.8 06/26/2016   Lab Results  Component Value Date   VD25OH 14.53 (L) 06/25/2023   VD25OH 24.09 (L) 10/02/2022   VD25OH 11.94 (L) 08/22/2022    No results found for: "PREALBUMIN"    Latest Ref Rng & Units 08/22/2022    9:59 AM 10/20/2020    9:28 AM 09/23/2019    4:49 PM  CBC EXTENDED  WBC 4.0 - 10.5 K/uL 5.3  4.9  5.3   RBC 4.22 - 5.81 Mil/uL 5.19  5.16  5.41   Hemoglobin 13.0 - 17.0 g/dL 29.5  18.8  41.6   HCT 39.0 - 52.0 % 40.9  41.2  43.7   Platelets 150.0 - 400.0 K/uL 199.0  241.0  231.0   NEUT# 1.4 - 7.7 K/uL 2.6   2.4   Lymph# 0.7 - 4.0  K/uL 1.6   2.0      There is no height or weight on file to calculate BMI.  Orders:  No orders of the defined types were placed in this encounter.  No orders of the defined types were placed in this encounter.    Procedures: No procedures performed  Clinical Data: No additional findings.  ROS:  All other systems negative, except as noted in the HPI. Review of Systems  Objective: Vital Signs: There were no vitals taken for this visit.  Specialty Comments:  No specialty comments available.  PMFS History: Patient Active Problem List   Diagnosis Date Noted   Low back pain 06/05/2023   Seasonal and perennial allergic rhinitis 09/07/2022   Allergic conjunctivitis of both eyes 09/07/2022   Feeding problems 12/02/2021   Abdominal migraine, not intractable ? 03/21/2019   Postsurgical dumping syndrome 02/14/2019   S/P functional endoscopic sinus surgery 11/27/2018   Abdominal pain, epigastric    Angioedema 04/19/2018   Intractable vomiting with nausea    Cigarette  nicotine dependence without complication 05/15/2016   Shifting sleep-work schedule, affecting sleep 05/15/2016   Sleep deprivation 05/15/2016   Sleep related hypoventilation in conditions classified elsewhere 05/15/2016   Sleep related headaches 05/15/2016   Morbid obesity (HCC) 09/19/2013   Diarrhea 07/10/2013   Colloid thyroid nodule 04/04/2013   Elevated blood pressure reading in office with diagnosis of hypertension 11/26/2012   Gastroparesis??? 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   History of migraine headaches    HTN (hypertension)    Kidney stone 09/20/2012   Migraine    Obesity    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    Pneumonia 02/27/2014   ED   Postsurgical dumping syndrome 02/14/2019   Sleep apnea    has cpap- does not wear     Family History  Problem Relation Age of Onset   Colon polyps Father    Diabetes Father    Hypertension Father    Hypertension Mother    Asthma Mother    Heart disease Maternal Grandmother    Colon cancer Neg Hx    Rectal cancer Neg Hx    Stomach cancer Neg Hx    Esophageal cancer Neg Hx     Past Surgical History:  Procedure Laterality Date   BALLOON DILATION N/A 12/06/2017   Procedure: BALLOON DILATION;  Surgeon: Iva Boop, MD;  [facility]: [facility];  Service: Endoscopy;  Laterality: N/A;   BIOPSY  06/21/2018   Procedure: BIOPSY;  Surgeon: Iva Boop, MD;  Location: [facility];  Service:  Endoscopy;;   CHOLECYSTECTOMY  2011   [facility]   CIRCUMCISION N/A 12/09/2018   Procedure: CIRCUMCISION ADULT;  Surgeon: Crista Elliot, MD;  [facility]: [facility];  Service: Urology;  Laterality: N/A;   COLONOSCOPY  07/26/11   Crohn's colitis, ileitis   ESOPHAGOGASTRODUODENOSCOPY  05/04/11, 07/26/11   granulomatous gastritis -  Crohn's   ESOPHAGOGASTRODUODENOSCOPY (EGD) WITH PROPOFOL N/A 03/28/2017   Procedure: ESOPHAGOGASTRODUODENOSCOPY (EGD) WITH PROPOFOL  with balloon dilation of duodenum;  Surgeon: Benancio Deeds, MD;  [facility];  Service: Gastroenterology;  Laterality: N/A;   ESOPHAGOGASTRODUODENOSCOPY (EGD) WITH PROPOFOL N/A 12/06/2017   Procedure: ESOPHAGOGASTRODUODENOSCOPY (EGD) WITH PROPOFOL;  Surgeon: Iva Boop, MD;  [facility];  Service: Endoscopy;  Laterality: N/A;   ESOPHAGOGASTRODUODENOSCOPY (EGD) WITH PROPOFOL N/A 06/21/2018   Procedure: ESOPHAGOGASTRODUODENOSCOPY (EGD) WITH PROPOFOL;  Surgeon: Iva Boop, MD;  [facility];  Service: Endoscopy;  Laterality: N/A;   ETHMOIDECTOMY N/A 11/27/2018   Procedure: ENDOSCOPIC TOTAL ETHMOIDECTOMY;  Surgeon: Newman Pies, MD;  [facility];  Service: ENT;  Laterality: N/A;   FLEXIBLE SIGMOIDOSCOPY     GASTROJEJUNOSTOMY  01/2018   MULTIPLE TOOTH EXTRACTIONS  2005   NASAL SEPTOPLASTY W/ TURBINOPLASTY Bilateral 11/27/2018   Procedure: NASAL SEPTOPLASTY WITH BILATERAL TURBINATE REDUCTION;  Surgeon: Newman Pies, MD;  Location: [facility];  Service: ENT;  Laterality: Bilateral;   SHOULDER SURGERY     right   SINUS ENDO WITH FUSION N/A 11/27/2018   Procedure: SINUS ENDO WITH FUSION;  Surgeon: Newman Pies, MD;  Location: [facility];  Service: ENT;  Laterality: N/A;   UPPER GASTROINTESTINAL ENDOSCOPY     VASECTOMY     bilateral w/lysis of penile adhesions   Social History   Occupational History   Occupation: Set designer: [employer]  Tobacco Use   Smoking status: Former    Current packs/day: 0.00     Average packs/day: 0.8 packs/day for 21.0 years (15.8 ttl pk-yrs)    Types: Cigarettes    Start date: 12/01/1996    Quit date: 12/01/2017    Years since quitting: 5.7    Passive exposure: Current (once in a while)   Smokeless tobacco: Former    Types: Snuff   Tobacco comments:    Counseling sheet given in exam room   Vaping Use   Vaping status: Never Used  Substance and Sexual Activity   Alcohol use: Not Currently   Drug use: No   Sexual activity: Yes    Partners: Female

## 2023-09-07 NOTE — Telephone Encounter (Signed)
VOB submitted for Durolane, bilateral knee  

## 2023-09-17 ENCOUNTER — Other Ambulatory Visit: Payer: Self-pay | Admitting: Family Medicine

## 2023-09-17 DIAGNOSIS — I10 Essential (primary) hypertension: Secondary | ICD-10-CM

## 2023-09-24 ENCOUNTER — Other Ambulatory Visit: Payer: Self-pay | Admitting: Family Medicine

## 2023-09-24 DIAGNOSIS — R351 Nocturia: Secondary | ICD-10-CM

## 2023-09-24 DIAGNOSIS — I10 Essential (primary) hypertension: Secondary | ICD-10-CM

## 2023-10-30 ENCOUNTER — Other Ambulatory Visit: Payer: Self-pay | Admitting: Family Medicine

## 2023-10-30 DIAGNOSIS — I10 Essential (primary) hypertension: Secondary | ICD-10-CM

## 2023-12-27 ENCOUNTER — Ambulatory Visit: Payer: Medicare Other | Admitting: Family Medicine

## 2023-12-27 VITALS — BP 136/80 | HR 74 | Temp 98.6°F | Ht 72.0 in | Wt 372.8 lb

## 2023-12-27 DIAGNOSIS — E559 Vitamin D deficiency, unspecified: Secondary | ICD-10-CM | POA: Diagnosis not present

## 2023-12-27 DIAGNOSIS — Z23 Encounter for immunization: Secondary | ICD-10-CM

## 2023-12-27 DIAGNOSIS — R7303 Prediabetes: Secondary | ICD-10-CM

## 2023-12-27 DIAGNOSIS — I10 Essential (primary) hypertension: Secondary | ICD-10-CM

## 2023-12-27 DIAGNOSIS — Z0000 Encounter for general adult medical examination without abnormal findings: Secondary | ICD-10-CM | POA: Diagnosis not present

## 2023-12-27 DIAGNOSIS — E538 Deficiency of other specified B group vitamins: Secondary | ICD-10-CM | POA: Diagnosis not present

## 2023-12-27 DIAGNOSIS — R351 Nocturia: Secondary | ICD-10-CM

## 2023-12-27 LAB — LIPID PANEL
Cholesterol: 166 mg/dL (ref 0–200)
HDL: 33.2 mg/dL — ABNORMAL LOW (ref >39.00)
LDL Cholesterol: 109 mg/dL — ABNORMAL HIGH (ref 0–99)
NonHDL: 132.51
Total CHOL/HDL Ratio: 5
Triglycerides: 117 mg/dL (ref 0.0–149.0)
VLDL: 23.4 mg/dL (ref 0.0–40.0)

## 2023-12-27 LAB — CBC
HCT: 40.3 % (ref 39.0–52.0)
Hemoglobin: 13.3 g/dL (ref 13.0–17.0)
MCHC: 32.9 g/dL (ref 30.0–36.0)
MCV: 76.6 fl — ABNORMAL LOW (ref 78.0–100.0)
Platelets: 217 10*3/uL (ref 150.0–400.0)
RBC: 5.26 Mil/uL (ref 4.22–5.81)
RDW: 16.8 % — ABNORMAL HIGH (ref 11.5–15.5)
WBC: 4.9 10*3/uL (ref 4.0–10.5)

## 2023-12-27 LAB — VITAMIN D 25 HYDROXY (VIT D DEFICIENCY, FRACTURES): VITD: 20.48 ng/mL — ABNORMAL LOW (ref 30.00–100.00)

## 2023-12-27 LAB — COMPREHENSIVE METABOLIC PANEL WITH GFR
ALT: 23 U/L (ref 0–53)
AST: 27 U/L (ref 0–37)
Albumin: 3.9 g/dL (ref 3.5–5.2)
Alkaline Phosphatase: 101 U/L (ref 39–117)
BUN: 14 mg/dL (ref 6–23)
CO2: 25 meq/L (ref 19–32)
Calcium: 8.6 mg/dL (ref 8.4–10.5)
Chloride: 104 meq/L (ref 96–112)
Creatinine, Ser: 1.17 mg/dL (ref 0.40–1.50)
GFR: 71.94 mL/min (ref >60.00)
Glucose, Bld: 85 mg/dL (ref 70–99)
Potassium: 3.7 meq/L (ref 3.5–5.1)
Sodium: 137 meq/L (ref 135–145)
Total Bilirubin: 0.4 mg/dL (ref 0.2–1.2)
Total Protein: 7.8 g/dL (ref 6.0–8.3)

## 2023-12-27 LAB — HEMOGLOBIN A1C: Hgb A1c MFr Bld: 6.1 % (ref 4.6–6.5)

## 2023-12-27 LAB — VITAMIN B12: Vitamin B-12: 382 pg/mL (ref 211–911)

## 2023-12-27 MED ORDER — VERAPAMIL HCL ER 180 MG PO TBCR: 360.0000 mg | EXTENDED_RELEASE_TABLET | Freq: Every day | ORAL | 3 refills | Status: DC

## 2023-12-27 MED ORDER — VITAMIN D (ERGOCALCIFEROL) 1.25 MG (50000 UNIT) PO CAPS: 50000.0000 [IU] | ORAL_CAPSULE | ORAL | 1 refills | Status: DC

## 2023-12-27 MED ORDER — DOXAZOSIN MESYLATE 8 MG PO TABS: 8.0000 mg | ORAL_TABLET | Freq: Every day | ORAL | 2 refills | Status: DC

## 2023-12-27 MED ORDER — OLMESARTAN MEDOXOMIL 20 MG PO TABS: 20.0000 mg | ORAL_TABLET | Freq: Every day | ORAL | 2 refills | Status: AC

## 2023-12-27 NOTE — Patient Instructions (Addendum)
 If higher blood pressure readings let me know, no changes for now. See info below on blood pressure and diet as well.  Start low, go slow with exercise - walking initially. Ideally 150 minutes per week.  If any concerns on labs I will let you know.  Take care  Managing Your Hypertension Hypertension, also called high blood pressure, is when the force of the blood pressing against the walls of the arteries is too strong. Arteries are blood vessels that carry blood from your heart throughout your body. Hypertension forces the heart to work harder to pump blood and may cause the arteries to become narrow or stiff. Understanding blood pressure readings A blood pressure reading includes a higher number over a lower number: The first, or top, number is called the systolic pressure. It is a measure of the pressure in your arteries as your heart beats. The second, or bottom number, is called the diastolic pressure. It is a measure of the pressure in your arteries as the heart relaxes. For most people, a normal blood pressure is below 120/80. Your personal target blood pressure may vary depending on your medical conditions, your age, and other factors. Blood pressure is classified into four stages. Based on your blood pressure reading, your health care provider may use the following stages to determine what type of treatment you need, if any. Systolic pressure and diastolic pressure are measured in a unit called millimeters of mercury (mmHg). Normal Systolic pressure: below 120. Diastolic pressure: below 80. Elevated Systolic pressure: 120-129. Diastolic pressure: below 80. Hypertension stage 1 Systolic pressure: 130-139. Diastolic pressure: 80-89. Hypertension stage 2 Systolic pressure: 140 or above. Diastolic pressure: 90 or above. How can this condition affect me? Managing your hypertension is very important. Over time, hypertension can damage the arteries and decrease blood flow to parts of the  body, including the brain, heart, and kidneys. Having untreated or uncontrolled hypertension can lead to: A heart attack. A stroke. A weakened blood vessel (aneurysm). Heart failure. Kidney damage. Eye damage. Memory and concentration problems. Vascular dementia. What actions can I take to manage this condition? Hypertension can be managed by making lifestyle changes and possibly by taking medicines. Your health care provider will help you make a plan to bring your blood pressure within a normal range. You may be referred for counseling on a healthy diet and physical activity. Nutrition  Eat a diet that is high in fiber and potassium, and low in salt (sodium), added sugar, and fat. An example eating plan is called the DASH diet. DASH stands for Dietary Approaches to Stop Hypertension. To eat this way: Eat plenty of fresh fruits and vegetables. Try to fill one-half of your plate at each meal with fruits and vegetables. Eat whole grains, such as whole-wheat pasta, brown rice, or whole-grain bread. Fill about one-fourth of your plate with whole grains. Eat low-fat dairy products. Avoid fatty cuts of meat, processed or cured meats, and poultry with skin. Fill about one-fourth of your plate with lean proteins such as fish, chicken without skin, beans, eggs, and tofu. Avoid pre-made and processed foods. These tend to be higher in sodium, added sugar, and fat. Reduce your daily sodium intake. Many people with hypertension should eat less than 1,500 mg of sodium a day. Lifestyle  Work with your health care provider to maintain a healthy body weight or to lose weight. Ask what an ideal weight is for you. Get at least 30 minutes of exercise that causes your heart to  beat faster (aerobic exercise) most days of the week. Activities may include walking, swimming, or biking. Include exercise to strengthen your muscles (resistance exercise), such as weight lifting, as part of your weekly exercise routine.  Try to do these types of exercises for 30 minutes at least 3 days a week. Do not use any products that contain nicotine or tobacco. These products include cigarettes, chewing tobacco, and vaping devices, such as e-cigarettes. If you need help quitting, ask your health care provider. Control any long-term (chronic) conditions you have, such as high cholesterol or diabetes. Identify your sources of stress and find ways to manage stress. This may include meditation, deep breathing, or making time for fun activities. Alcohol use Do not drink alcohol if: Your health care provider tells you not to drink. You are pregnant, may be pregnant, or are planning to become pregnant. If you drink alcohol: Limit how much you have to: 0-1 drink a day for women. 0-2 drinks a day for men. Know how much alcohol is in your drink. In the [place] one drink equals one 12 oz bottle of beer (355 mL), one 5 oz glass of wine (148 mL), or one 1 oz glass of hard liquor (44 mL). Medicines Your health care provider may prescribe medicine if lifestyle changes are not enough to get your blood pressure under control and if: Your systolic blood pressure is 130 or higher. Your diastolic blood pressure is 80 or higher. Take medicines only as told by your health care provider. Follow the directions carefully. Blood pressure medicines must be taken as told by your health care provider. The medicine does not work as well when you skip doses. Skipping doses also puts you at risk for problems. Monitoring Before you monitor your blood pressure: Do not smoke, drink caffeinated beverages, or exercise within 30 minutes before taking a measurement. Use the bathroom and empty your bladder (urinate). Sit quietly for at least 5 minutes before taking measurements. Monitor your blood pressure at home as told by your health care provider. To do this: Sit with your back straight and supported. Place your feet flat on the floor. Do not cross your  legs. Support your arm on a flat surface, such as a table. Make sure your upper arm is at heart level. Each time you measure, take two or three readings one minute apart and record the results. You may also need to have your blood pressure checked regularly by your health care provider. General information Talk with your health care provider about your diet, exercise habits, and other lifestyle factors that may be contributing to hypertension. Review all the medicines you take with your health care provider because there may be side effects or interactions. Keep all follow-up visits. Your health care provider can help you create and adjust your plan for managing your high blood pressure. Where to find more information National Heart, Lung, and Blood Institute: [employer] American Heart Association: www.heart.org Contact a health care provider if: You think you are having a reaction to medicines you have taken. You have repeated (recurrent) headaches. You feel dizzy. You have swelling in your ankles. You have trouble with your vision. Get help right away if: You develop a severe headache or confusion. You have unusual weakness or numbness, or you feel faint. You have severe pain in your chest or abdomen. You vomit repeatedly. You have trouble breathing. These symptoms may be an emergency. Get help right away. Call 911. Do not wait to see if the symptoms  will go away. Do not drive yourself to the hospital. Summary Hypertension is when the force of blood pumping through your arteries is too strong. If this condition is not controlled, it may put you at risk for serious complications. Your personal target blood pressure may vary depending on your medical conditions, your age, and other factors. For most people, a normal blood pressure is less than 120/80. Hypertension is managed by lifestyle changes, medicines, or both. Lifestyle changes to help manage hypertension include losing  weight, eating a healthy, low-sodium diet, exercising more, stopping smoking, and limiting alcohol. This information is not intended to replace advice given to you by your health care provider. Make sure you discuss any questions you have with your health care provider. Document Revised: 03/03/2021 Document Reviewed: 03/03/2021 Elsevier Patient Education  2024 [employer].  Preventive Care 42-39 Years Old, Male Preventive care refers to lifestyle choices and visits with your health care provider that can promote health and wellness. Preventive care visits are also called wellness exams. What can I expect for my preventive care visit? Counseling During your preventive care visit, your health care provider may ask about your: Medical history, including: Past medical problems. Family medical history. Current health, including: Emotional well-being. Home life and relationship well-being. Sexual activity. Lifestyle, including: Alcohol, nicotine or tobacco, and drug use. Access to firearms. Diet, exercise, and sleep habits. Safety issues such as seatbelt and bike helmet use. Sunscreen use. Work and work Astronomer. Physical exam Your health care provider will check your: Height and weight. These may be used to calculate your BMI (body mass index). BMI is a measurement that tells if you are at a healthy weight. Waist circumference. This measures the distance around your waistline. This measurement also tells if you are at a healthy weight and may help predict your risk of certain diseases, such as type 2 diabetes and high blood pressure. Heart rate and blood pressure. Body temperature. Skin for abnormal spots. What immunizations do I need?  Vaccines are usually given at various ages, according to a schedule. Your health care provider will recommend vaccines for you based on your age, medical history, and lifestyle or other factors, such as travel or where you work. What tests do I  need? Screening Your health care provider may recommend screening tests for certain conditions. This may include: Lipid and cholesterol levels. Diabetes screening. This is done by checking your blood sugar (glucose) after you have not eaten for a while (fasting). Hepatitis B test. Hepatitis C test. HIV (human immunodeficiency virus) test. STI (sexually transmitted infection) testing, if you are at risk. Lung cancer screening. Prostate cancer screening. Colorectal cancer screening. Talk with your health care provider about your test results, treatment options, and if necessary, the need for more tests. Follow these instructions at home: Eating and drinking  Eat a diet that includes fresh fruits and vegetables, whole grains, lean protein, and low-fat dairy products. Take vitamin and mineral supplements as recommended by your health care provider. Do not drink alcohol if your health care provider tells you not to drink. If you drink alcohol: Limit how much you have to 0-2 drinks a day. Know how much alcohol is in your drink. In the [place] one drink equals one 12 oz bottle of beer (355 mL), one 5 oz glass of wine (148 mL), or one 1 oz glass of hard liquor (44 mL). Lifestyle Brush your teeth every morning and night with fluoride toothpaste. Floss one time each day. Exercise for at  least 30 minutes 5 or more days each week. Do not use any products that contain nicotine or tobacco. These products include cigarettes, chewing tobacco, and vaping devices, such as e-cigarettes. If you need help quitting, ask your health care provider. Do not use drugs. If you are sexually active, practice safe sex. Use a condom or other form of protection to prevent STIs. Take aspirin  only as told by your health care provider. Make sure that you understand how much to take and what form to take. Work with your health care provider to find out whether it is safe and beneficial for you to take aspirin  daily. Find  healthy ways to manage stress, such as: Meditation, yoga, or listening to music. Journaling. Talking to a trusted person. Spending time with friends and family. Minimize exposure to UV radiation to reduce your risk of skin cancer. Safety Always wear your seat belt while driving or riding in a vehicle. Do not drive: If you have been drinking alcohol. Do not ride with someone who has been drinking. When you are tired or distracted. While texting. If you have been using any mind-altering substances or drugs. Wear a helmet and other protective equipment during sports activities. If you have firearms in your house, make sure you follow all gun safety procedures. What's next? Go to your health care provider once a year for an annual wellness visit. Ask your health care provider how often you should have your eyes and teeth checked. Stay up to date on all vaccines. This information is not intended to replace advice given to you by your health care provider. Make sure you discuss any questions you have with your health care provider. Document Revised: 12/15/2020 Document Reviewed: 12/15/2020 Elsevier Patient Education  2024 [employer].

## 2023-12-27 NOTE — Progress Notes (Signed)
 Subjective:  Patient ID: Craig Kayla Raddle., male    DOB: Sep 26, 1970  Age: 53 y.o. MRN: [id number]  CC:  Chief Complaint  Patient presents with   Annual Exam    Pt is doing well, expresses no concerns, pt is fasting     HPI Craig Kayla Raddle. presents for Annual Exam  PCP, me Gastroenterology, Dr. Avram, history of Crohn's, postgastrectomy malabsorption and dumping syndrome - same.  Ortho/sports medicine, Dr. Burnetta, left hip pain Allergy , Dr. Jeneal, seasonal and perennial allergic rhinitis, allergic conjunctivitis. Treated with ryaltris  prior. No recent use - did not tolerate.  ENT, Dr. Llewellyn, history of nasal septal perforation, tinnitus   Hypertension:  Doxazosin  8mg  at bedtime - also helps nocturia. Olmesartan  20mg  every day.  verapamil   360mg  every day., without any new  side effects. Home readings: 1125-130/80-90.   BP Readings from Last 3 Encounters:  12/27/23 136/80  06/25/23 (!) 154/80  05/29/23 123/81   Lab Results  Component Value Date   CREATININE 1.11 06/25/2023   Prediabetes: Minimal weight changes since last visit.  Previously has met with nutritionist given his Crohn's and postsurgical dumping syndrome and has been on a bariatric supplement. Trying to watch diet. Some limitations with exercise. Low intensity exercise discussed - does some walking around the house.  Lab Results  Component Value Date   HGBA1C 6.1 06/25/2023   Wt Readings from Last 3 Encounters:  12/27/23 (!) 372 lb 12.8 oz (169.1 kg)  06/25/23 (!) 371 lb 3.2 oz (168.4 kg)  05/16/23 (!) 369 lb 3.2 oz (167.5 kg)   History of vitamin D  deficiency Prior 50,000 unit dosing weekly. Has been taking weekly, off meds at last visit for a few weeks, and again few weeks ago. Back on meds past 2 weeks. Monthly B12 injections. Last vitamin D  Lab Results  Component Value Date   VD25OH 14.53 (L) 06/25/2023   Lab Results  Component Value Date   VITAMINB12 494 11/02/2021         12/27/2023    10:31 AM 04/09/2023   11:06 AM 10/02/2022   10:20 AM 08/16/2022   10:36 AM 03/31/2022   11:15 AM  Depression screen PHQ 2/9  Decreased Interest 1 1 0 0 0  Down, Depressed, Hopeless 0 0 0 0 0  PHQ - 2 Score 1 1 0 0 0  Altered sleeping 0 0 0  0  Tired, decreased energy 2 2 1  2   Change in appetite 0 0 3  3  Feeling bad or failure about yourself  0 0 0  0  Trouble concentrating 0 0 0  0  Moving slowly or fidgety/restless 0 0 0  0  Suicidal thoughts 0 0 0  0  PHQ-9 Score 3 3 4  5   Difficult doing work/chores Not difficult at all        Health Maintenance  Topic Date Due   Hepatitis B Vaccines (1 of 3 - 19+ 3-dose series) Never done   Pneumococcal Vaccine 84-43 Years old (2 of 2 - PCV) 08/31/2012   Zoster Vaccines- Shingrix  (1 of 2) Never done   COVID-19 Vaccine (4 - 2024-25 season) 03/04/2023   Medicare Annual Wellness (AWV)  08/17/2023   INFLUENZA VACCINE  02/01/2024   Colonoscopy  01/25/2028   DTaP/Tdap/Td (3 - Td or Tdap) 09/29/2031   Hepatitis C Screening  Completed   HIV Screening  Completed   HPV VACCINES  Aged Out   Meningococcal B Vaccine  Aged Out  Colonoscopy 2019, ongoing follow-up with GI due to his Crohn's as above. Prostate: does not have family history of prostate cancer The natural history of prostate cancer and ongoing controversy regarding screening and potential treatment outcomes of prostate cancer has been discussed with the patient. The meaning of a false positive PSA and a false negative PSA has been discussed. He indicates understanding of the limitations of this screening test and wishes to proceed with screening PSA testing.  Lab Results  Component Value Date   PSA1 0.3 11/08/2017   PSA1 0.3 11/06/2016   PSA 0.88 10/02/2022   PSA 0.17 09/28/2021   PSA 0.2 03/25/2016      Immunization History  Administered Date(s) Administered   Hepatitis A 09/01/2011, 03/22/2012   Hepatitis A, Adult 09/01/2011, 03/22/2012   Influenza, Seasonal, Injecte,  Preservative Fre 04/25/2023   Influenza,inj,Quad PF,6+ Mos 05/01/2013, 04/07/2015, 06/26/2016, 04/12/2018, 03/06/2019, 09/28/2021, 03/31/2022   Influenza-Unspecified 03/16/2020, 03/30/2021   Moderna Sars-Covid-2 Vaccination 10/30/2019, 11/27/2019, 07/17/2020   PPD Test 05/19/2011, 12/24/2012, 12/28/2014   Pneumococcal Polysaccharide-23 09/01/2011   Tdap 09/01/2011, 09/28/2021  Prevnar 20 today.  Shingrix  today.  Flu vaccine in fall. Covid booster - recommended.   No results found. Wears glasses, appt 1 year ago.   Dental: usually every 6-8 months.   Alcohol:none  Tobacco: none.   Exercise: as above - around home - FITT principle discussed, start low/go slow.    History Patient Active Problem List   Diagnosis Date Noted   Low back pain 06/05/2023   Seasonal and perennial allergic rhinitis 09/07/2022   Allergic conjunctivitis of both eyes 09/07/2022   Feeding problems 12/02/2021   Nocturia 09/23/2021   Nasal septal perforation 11/05/2020   Aortic atherosclerosis (HCC) 10/27/2020   Diastolic congestive heart failure (HCC) 10/27/2020   Advance care planning 10/27/2020   Healthcare maintenance 10/27/2020   Hyperlipidemia 10/27/2020   Prediabetes 10/27/2020   Abdominal migraine, not intractable ? 03/21/2019   Postsurgical dumping syndrome 02/14/2019   S/P functional endoscopic sinus surgery 11/27/2018   Abdominal pain, epigastric    Angioedema 04/19/2018   Intractable vomiting with nausea    Cigarette nicotine dependence without complication 05/15/2016   Shifting sleep-work schedule, affecting sleep 05/15/2016   Sleep deprivation 05/15/2016   Sleep related hypoventilation in conditions classified elsewhere 05/15/2016   Sleep related headaches 05/15/2016   Nausea 12/31/2013   Morbid obesity (HCC) 09/19/2013   Diarrhea 07/10/2013   Colloid thyroid  nodule 04/04/2013   Elevated blood pressure reading in office with diagnosis of hypertension 11/26/2012   Gastroparesis???  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   History of migraine headaches    HTN (hypertension)    Kidney stone 09/20/2012   Migraine    Obesity    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    Pneumonia 02/27/2014   ED   Postsurgical dumping syndrome 02/14/2019   Sleep apnea    has  cpap- does not wear    Past Surgical History:  Procedure Laterality Date   BALLOON DILATION N/A 12/06/2017   Procedure: BALLOON DILATION;  Surgeon: Avram Lupita BRAVO, MD;  Location: [facility];  Service: Endoscopy;  Laterality: N/A;   BIOPSY  06/21/2018   Procedure: BIOPSY;  Surgeon: Avram Lupita BRAVO, MD;  Location: [facility];  Service: Endoscopy;;   CHOLECYSTECTOMY  2011   [place]   CIRCUMCISION N/A 12/09/2018   Procedure: CIRCUMCISION ADULT;  Surgeon: Carolee Sherwood JONETTA DOUGLAS, MD;  Location: [facility];  Service: Urology;  Laterality: N/A;   COLONOSCOPY  07/26/11   Crohn's colitis, ileitis   ESOPHAGOGASTRODUODENOSCOPY  05/04/11, 07/26/11   granulomatous gastritis - Crohn's   ESOPHAGOGASTRODUODENOSCOPY (EGD) WITH PROPOFOL  N/A 03/28/2017   Procedure: ESOPHAGOGASTRODUODENOSCOPY (EGD) WITH PROPOFOL   with balloon dilation of duodenum;  Surgeon: Leigh Elspeth SQUIBB, MD;  Location: [facility];  Service: Gastroenterology;  Laterality: N/A;   ESOPHAGOGASTRODUODENOSCOPY (EGD) WITH PROPOFOL  N/A 12/06/2017   Procedure: ESOPHAGOGASTRODUODENOSCOPY (EGD) WITH PROPOFOL ;  Surgeon: Avram Lupita BRAVO, MD;   Location: [facility];  Service: Endoscopy;  Laterality: N/A;   ESOPHAGOGASTRODUODENOSCOPY (EGD) WITH PROPOFOL  N/A 06/21/2018   Procedure: ESOPHAGOGASTRODUODENOSCOPY (EGD) WITH PROPOFOL ;  Surgeon: Avram Lupita BRAVO, MD;  Location: [facility];  Service: Endoscopy;  Laterality: N/A;   ETHMOIDECTOMY N/A 11/27/2018   Procedure: ENDOSCOPIC TOTAL ETHMOIDECTOMY;  Surgeon: Karis Clunes, MD;  Location: [facility];  Service: ENT;  Laterality: N/A;   FLEXIBLE SIGMOIDOSCOPY     GASTROJEJUNOSTOMY  01/2018   MULTIPLE TOOTH EXTRACTIONS  2005   NASAL SEPTOPLASTY W/ TURBINOPLASTY Bilateral 11/27/2018   Procedure: NASAL SEPTOPLASTY WITH BILATERAL TURBINATE REDUCTION;  Surgeon: Karis Clunes, MD;  Location: [facility];  Service: ENT;  Laterality: Bilateral;   SHOULDER SURGERY     right   SINUS ENDO WITH FUSION N/A 11/27/2018   Procedure: SINUS ENDO WITH FUSION;  Surgeon: Karis Clunes, MD;  Location: [facility];  Service: ENT;  Laterality: N/A;   UPPER GASTROINTESTINAL ENDOSCOPY     VASECTOMY     bilateral w/lysis of penile adhesions   Allergies  Allergen Reactions   Zithromax [Azithromycin] Hypertension   Lisinopril  Swelling    SWELLING REACTION UNSPECIFIED    Tessalon  Perles [Benzonatate ]     hypertension   Prior to Admission medications   Medication Sig Start Date End Date Taking? Authorizing Provider  Calcium Carb-Cholecalciferol (CALCIUM 1000 + D PO) Take by mouth.   Yes [provider]  cetirizine  (ZYRTEC ) 10 MG tablet Take 1 tablet (10 mg total) by mouth 2 (two) times daily. 01/12/23  Yes Padgett, Danita Macintosh, MD  cyanocobalamin  (VITAMIN B12) 1000 MCG/ML injection Inject 1 mL (1,000 mcg total) into the muscle every 30 (thirty) days. 07/05/23  Yes Avram Lupita BRAVO, MD  cyclobenzaprine  (FLEXERIL ) 10 MG tablet Take 0.5-1 tablets (5-10 mg total) by mouth 3 (three) times daily as needed for muscle spasms (back pain.). 05/16/23  Yes Levora Reyes SAUNDERS, MD  doxazosin  (CARDURA ) 8 MG tablet TAKE 1 TABLET BY MOUTH AT  BEDTIME  09/24/23  Yes Levora Reyes SAUNDERS, MD  montelukast  (SINGULAIR ) 10 MG tablet TAKE 1 TABLET BY MOUTH EVERYDAY AT BEDTIME 02/16/23  Yes Padgett, Danita Macintosh, MD  Multiple Vitamins-Calcium (MULTI-DAY/CALCIUM/EXTRA IRON PO) Take by mouth.   Yes [provider]  olmesartan  (BENICAR ) 20 MG tablet TAKE ONE TABLET poqd 10/30/23  Yes Levora Reyes SAUNDERS, MD  Olopatadine-Mometasone (RYALTRIS ) 665-25 MCG/ACT SUSP Place 2 sprays into the nose 2 (two) times daily as needed (runny/stuffy nose). 09/01/22  Yes Jeneal Danita Macintosh, MD  ondansetron  (ZOFRAN -ODT) 8 MG disintegrating tablet Take 1 tablet (8 mg total) by mouth every 8 (eight) hours as needed for nausea or vomiting. 02/14/23  Yes Avram Lupita BRAVO, MD  rizatriptan  (MAXALT -MLT) 10 MG disintegrating tablet TAKE 1 TABLET 3 TIME A DA Y AS NEEDED FOR MIGRAINE 01/26/20  Yes Avram Lupita BRAVO, MD  verapamil  (CALAN -SR) 180 MG CR tablet TAKE 2 TABLETS BY MOUTH DAILY 09/17/23  Yes Levora Reyes SAUNDERS, MD  Vitamin D , Ergocalciferol , (DRISDOL ) 1.25 MG (50000 UNIT) CAPS capsule Take 1 capsule (50,000 Units total) by mouth every 7 (seven) days. 07/01/23  Yes Levora Reyes SAUNDERS, MD   Social History   Socioeconomic History   Marital status: Married    Spouse name: Not on file   Number of children: 3   Years of education: Some college   Highest education level: Associate degree: occupational, Scientist, product/process development, or vocational program  Occupational History   Occupation: Set designer: [employer]  Tobacco Use   Smoking status: Former    Current packs/day: 0.00    Average packs/day: 0.8 packs/day for 21.0 years (15.8 ttl pk-yrs)    Types: Cigarettes    Start date: 12/01/1996    Quit date: 12/01/2017    Years since quitting: 6.0    Passive exposure: Current (once in a while)   Smokeless tobacco: Former    Types: Snuff   Tobacco comments:    Counseling sheet given in exam room   Vaping Use   Vaping status: Never Used  Substance and Sexual Activity   Alcohol use:  Not Currently   Drug use: No   Sexual activity: Yes    Partners: Female  Other Topics Concern   Not on file  Social History Narrative   Married. Education: [employer].    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    Right-handed   Caffeine: 3 sodas per day   Occasional alcohol no drugs   Former smoker former snuff user   Social Drivers of Corporate investment banker Strain: Low Risk  (12/27/2023)   Overall Financial Resource Strain (CARDIA)    Difficulty of Paying Living Expenses: Not hard at all  Food Insecurity: No Food Insecurity (12/27/2023)   Hunger Vital Sign    Worried About Running Out of Food in the Last Year: Never true    Ran Out of Food in the Last Year: Never true  Transportation Needs: No Transportation Needs (12/27/2023)   PRAPARE - Administrator, Civil Service (Medical): No    Lack of Transportation (Non-Medical): No  Physical Activity: Inactive (12/27/2023)   Exercise Vital Sign    Days of Exercise per Week: 0 days    Minutes of Exercise per Session: Not on file  Stress: No Stress Concern Present (12/27/2023)   [employer] of Occupational Health - Occupational Stress Questionnaire    Feeling of Stress: Not at all  Social Connections: Moderately Isolated (12/27/2023)   Social Connection and Isolation Panel    Frequency of Communication with Friends and Family: More than three times a week    Frequency of Social Gatherings with Friends and Family: Twice a week    Attends Religious Services: Never    Database administrator or Organizations: No    Attends Banker Meetings: Not on file    Marital Status: Married  Intimate Partner Violence: Not At Risk (08/16/2022)   Humiliation,  Afraid, Rape, and Kick questionnaire    Fear of Current or Ex-Partner: No    Emotionally Abused: No    Physically Abused: No    Sexually Abused: No    Review of Systems  Per HPI Objective:   Vitals:   12/27/23 1034  BP:  136/80  Pulse: 74  Temp: 98.6 F (37 C)  TempSrc: Temporal  SpO2: 99%  Weight: (!) 372 lb 12.8 oz (169.1 kg)  Height: 6' (1.829 m)     Physical Exam Vitals reviewed.  Constitutional:      Appearance: He is well-developed. He is obese.  HENT:     Head: Normocephalic and atraumatic.     Right Ear: External ear normal.     Left Ear: External ear normal.   Eyes:     Conjunctiva/sclera: Conjunctivae normal.     Pupils: Pupils are equal, round, and reactive to light.   Neck:     Thyroid : No thyromegaly.   Cardiovascular:     Rate and Rhythm: Normal rate and regular rhythm.     Heart sounds: Normal heart sounds.  Pulmonary:     Effort: Pulmonary effort is normal. No respiratory distress.     Breath sounds: Normal breath sounds. No wheezing.  Abdominal:     General: There is no distension.     Palpations: Abdomen is soft.     Tenderness: There is no abdominal tenderness.   Musculoskeletal:        General: No tenderness. Normal range of motion.     Cervical back: Normal range of motion and neck supple.  Lymphadenopathy:     Cervical: No cervical adenopathy.   Skin:    General: Skin is warm and dry.   Neurological:     Mental Status: He is alert and oriented to person, place, and time.     Deep Tendon Reflexes: Reflexes are normal and symmetric.   Psychiatric:        Behavior: Behavior normal.        Assessment & Plan:  Craig Guzman. is a 53 y.o. male . Annual physical exam  - -anticipatory guidance as below in AVS, screening labs above. Health maintenance items as above in HPI discussed/recommended as applicable.   Need for vaccination against Streptococcus pneumoniae - Plan: Pneumococcal conjugate vaccine 20-valent (Prevnar 20)  Need for shingles vaccine - Plan: Varicella-zoster vaccine IM  Vitamin D  deficiency - Plan: Vitamin D  (25 hydroxy), Vitamin D , Ergocalciferol , (DRISDOL ) 1.25 MG (50000 UNIT) CAPS capsule  - Check labs and adjust medications  accordingly.  Essential hypertension - Plan: Comprehensive metabolic panel with GFR, Lipid panel, CBC, doxazosin  (CARDURA ) 8 MG tablet, olmesartan  (BENICAR ) 20 MG tablet, verapamil  (CALAN -SR) 180 MG CR tablet  - Borderline but overall stable on current regimen, check home readings, RTC precautions.  Handout on salty 6 foods as well as management of hypertension for possible dietary changes.  Prediabetes - Plan: Comprehensive metabolic panel with GFR, Lipid panel, Hemoglobin A1c  - Check labs and adjust plan accordingly.  B12 deficiency - Plan: CBC, B12  - Continue supplementation, check levels and adjust plan accordingly  Nocturia - Plan: doxazosin  (CARDURA ) 8 MG tablet Stable with current regimen, continue same.  Meds ordered this encounter  Medications   doxazosin  (CARDURA ) 8 MG tablet    Sig: Take 1 tablet (8 mg total) by mouth at bedtime.    Dispense:  100 tablet    Refill:  2    Please send a replace/new response with  100-Day Supply if appropriate to maximize member benefit. Requesting 1 year supply.   olmesartan  (BENICAR ) 20 MG tablet    Sig: Take 1 tablet (20 mg total) by mouth daily.    Dispense:  90 tablet    Refill:  2   verapamil  (CALAN -SR) 180 MG CR tablet    Sig: Take 2 tablets (360 mg total) by mouth daily.    Dispense:  180 tablet    Refill:  3    Please send a replace/new response with 100-Day Supply if appropriate to maximize member benefit. Requesting 1 year supply.   Vitamin D , Ergocalciferol , (DRISDOL ) 1.25 MG (50000 UNIT) CAPS capsule    Sig: Take 1 capsule (50,000 Units total) by mouth every 7 (seven) days.    Dispense:  12 capsule    Refill:  1   Patient Instructions  If higher blood pressure readings let me know, no changes for now. See info below on blood pressure and diet as well.  Start low, go slow with exercise - walking initially. Ideally 150 minutes per week.  If any concerns on labs I will let you know.  Take care  Managing Your  Hypertension Hypertension, also called high blood pressure, is when the force of the blood pressing against the walls of the arteries is too strong. Arteries are blood vessels that carry blood from your heart throughout your body. Hypertension forces the heart to work harder to pump blood and may cause the arteries to become narrow or stiff. Understanding blood pressure readings A blood pressure reading includes a higher number over a lower number: The first, or top, number is called the systolic pressure. It is a measure of the pressure in your arteries as your heart beats. The second, or bottom number, is called the diastolic pressure. It is a measure of the pressure in your arteries as the heart relaxes. For most people, a normal blood pressure is below 120/80. Your personal target blood pressure may vary depending on your medical conditions, your age, and other factors. Blood pressure is classified into four stages. Based on your blood pressure reading, your health care provider may use the following stages to determine what type of treatment you need, if any. Systolic pressure and diastolic pressure are measured in a unit called millimeters of mercury (mmHg). Normal Systolic pressure: below 120. Diastolic pressure: below 80. Elevated Systolic pressure: 120-129. Diastolic pressure: below 80. Hypertension stage 1 Systolic pressure: 130-139. Diastolic pressure: 80-89. Hypertension stage 2 Systolic pressure: 140 or above. Diastolic pressure: 90 or above. How can this condition affect me? Managing your hypertension is very important. Over time, hypertension can damage the arteries and decrease blood flow to parts of the body, including the brain, heart, and kidneys. Having untreated or uncontrolled hypertension can lead to: A heart attack. A stroke. A weakened blood vessel (aneurysm). Heart failure. Kidney damage. Eye damage. Memory and concentration problems. Vascular dementia. What  actions can I take to manage this condition? Hypertension can be managed by making lifestyle changes and possibly by taking medicines. Your health care provider will help you make a plan to bring your blood pressure within a normal range. You may be referred for counseling on a healthy diet and physical activity. Nutrition  Eat a diet that is high in fiber and potassium, and low in salt (sodium), added sugar, and fat. An example eating plan is called the DASH diet. DASH stands for Dietary Approaches to Stop Hypertension. To eat this way: Eat plenty of fresh  fruits and vegetables. Try to fill one-half of your plate at each meal with fruits and vegetables. Eat whole grains, such as whole-wheat pasta, brown rice, or whole-grain bread. Fill about one-fourth of your plate with whole grains. Eat low-fat dairy products. Avoid fatty cuts of meat, processed or cured meats, and poultry with skin. Fill about one-fourth of your plate with lean proteins such as fish, chicken without skin, beans, eggs, and tofu. Avoid pre-made and processed foods. These tend to be higher in sodium, added sugar, and fat. Reduce your daily sodium intake. Many people with hypertension should eat less than 1,500 mg of sodium a day. Lifestyle  Work with your health care provider to maintain a healthy body weight or to lose weight. Ask what an ideal weight is for you. Get at least 30 minutes of exercise that causes your heart to beat faster (aerobic exercise) most days of the week. Activities may include walking, swimming, or biking. Include exercise to strengthen your muscles (resistance exercise), such as weight lifting, as part of your weekly exercise routine. Try to do these types of exercises for 30 minutes at least 3 days a week. Do not use any products that contain nicotine or tobacco. These products include cigarettes, chewing tobacco, and vaping devices, such as e-cigarettes. If you need help quitting, ask your health care  provider. Control any long-term (chronic) conditions you have, such as high cholesterol or diabetes. Identify your sources of stress and find ways to manage stress. This may include meditation, deep breathing, or making time for fun activities. Alcohol use Do not drink alcohol if: Your health care provider tells you not to drink. You are pregnant, may be pregnant, or are planning to become pregnant. If you drink alcohol: Limit how much you have to: 0-1 drink a day for women. 0-2 drinks a day for men. Know how much alcohol is in your drink. In the [place] one drink equals one 12 oz bottle of beer (355 mL), one 5 oz glass of wine (148 mL), or one 1 oz glass of hard liquor (44 mL). Medicines Your health care provider may prescribe medicine if lifestyle changes are not enough to get your blood pressure under control and if: Your systolic blood pressure is 130 or higher. Your diastolic blood pressure is 80 or higher. Take medicines only as told by your health care provider. Follow the directions carefully. Blood pressure medicines must be taken as told by your health care provider. The medicine does not work as well when you skip doses. Skipping doses also puts you at risk for problems. Monitoring Before you monitor your blood pressure: Do not smoke, drink caffeinated beverages, or exercise within 30 minutes before taking a measurement. Use the bathroom and empty your bladder (urinate). Sit quietly for at least 5 minutes before taking measurements. Monitor your blood pressure at home as told by your health care provider. To do this: Sit with your back straight and supported. Place your feet flat on the floor. Do not cross your legs. Support your arm on a flat surface, such as a table. Make sure your upper arm is at heart level. Each time you measure, take two or three readings one minute apart and record the results. You may also need to have your blood pressure checked regularly by your health  care provider. General information Talk with your health care provider about your diet, exercise habits, and other lifestyle factors that may be contributing to hypertension. Review all the medicines you take  with your health care provider because there may be side effects or interactions. Keep all follow-up visits. Your health care provider can help you create and adjust your plan for managing your high blood pressure. Where to find more information National Heart, Lung, and Blood Institute: PopSte[employer].is American Heart Association: www.heart.org Contact a health care provider if: You think you are having a reaction to medicines you have taken. You have repeated (recurrent) headaches. You feel dizzy. You have swelling in your ankles. You have trouble with your vision. Get help right away if: You develop a severe headache or confusion. You have unusual weakness or numbness, or you feel faint. You have severe pain in your chest or abdomen. You vomit repeatedly. You have trouble breathing. These symptoms may be an emergency. Get help right away. Call 911. Do not wait to see if the symptoms will go away. Do not drive yourself to the hospital. Summary Hypertension is when the force of blood pumping through your arteries is too strong. If this condition is not controlled, it may put you at risk for serious complications. Your personal target blood pressure may vary depending on your medical conditions, your age, and other factors. For most people, a normal blood pressure is less than 120/80. Hypertension is managed by lifestyle changes, medicines, or both. Lifestyle changes to help manage hypertension include losing weight, eating a healthy, low-sodium diet, exercising more, stopping smoking, and limiting alcohol. This information is not intended to replace advice given to you by your health care provider. Make sure you discuss any questions you have with your health care  provider. Document Revised: 03/03/2021 Document Reviewed: 03/03/2021 Elsevier Patient Education  2024 [employer].  Preventive Care 35-68 Years Old, Male Preventive care refers to lifestyle choices and visits with your health care provider that can promote health and wellness. Preventive care visits are also called wellness exams. What can I expect for my preventive care visit? Counseling During your preventive care visit, your health care provider may ask about your: Medical history, including: Past medical problems. Family medical history. Current health, including: Emotional well-being. Home life and relationship well-being. Sexual activity. Lifestyle, including: Alcohol, nicotine or tobacco, and drug use. Access to firearms. Diet, exercise, and sleep habits. Safety issues such as seatbelt and bike helmet use. Sunscreen use. Work and work Astronomer. Physical exam Your health care provider will check your: Height and weight. These may be used to calculate your BMI (body mass index). BMI is a measurement that tells if you are at a healthy weight. Waist circumference. This measures the distance around your waistline. This measurement also tells if you are at a healthy weight and may help predict your risk of certain diseases, such as type 2 diabetes and high blood pressure. Heart rate and blood pressure. Body temperature. Skin for abnormal spots. What immunizations do I need?  Vaccines are usually given at various ages, according to a schedule. Your health care provider will recommend vaccines for you based on your age, medical history, and lifestyle or other factors, such as travel or where you work. What tests do I need? Screening Your health care provider may recommend screening tests for certain conditions. This may include: Lipid and cholesterol levels. Diabetes screening. This is done by checking your blood sugar (glucose) after you have not eaten for a while  (fasting). Hepatitis B test. Hepatitis C test. HIV (human immunodeficiency virus) test. STI (sexually transmitted infection) testing, if you are at risk. Lung cancer screening. Prostate cancer screening. Colorectal  cancer screening. Talk with your health care provider about your test results, treatment options, and if necessary, the need for more tests. Follow these instructions at home: Eating and drinking  Eat a diet that includes fresh fruits and vegetables, whole grains, lean protein, and low-fat dairy products. Take vitamin and mineral supplements as recommended by your health care provider. Do not drink alcohol if your health care provider tells you not to drink. If you drink alcohol: Limit how much you have to 0-2 drinks a day. Know how much alcohol is in your drink. In the [place] one drink equals one 12 oz bottle of beer (355 mL), one 5 oz glass of wine (148 mL), or one 1 oz glass of hard liquor (44 mL). Lifestyle Brush your teeth every morning and night with fluoride toothpaste. Floss one time each day. Exercise for at least 30 minutes 5 or more days each week. Do not use any products that contain nicotine or tobacco. These products include cigarettes, chewing tobacco, and vaping devices, such as e-cigarettes. If you need help quitting, ask your health care provider. Do not use drugs. If you are sexually active, practice safe sex. Use a condom or other form of protection to prevent STIs. Take aspirin  only as told by your health care provider. Make sure that you understand how much to take and what form to take. Work with your health care provider to find out whether it is safe and beneficial for you to take aspirin  daily. Find healthy ways to manage stress, such as: Meditation, yoga, or listening to music. Journaling. Talking to a trusted person. Spending time with friends and family. Minimize exposure to UV radiation to reduce your risk of skin cancer. Safety Always wear  your seat belt while driving or riding in a vehicle. Do not drive: If you have been drinking alcohol. Do not ride with someone who has been drinking. When you are tired or distracted. While texting. If you have been using any mind-altering substances or drugs. Wear a helmet and other protective equipment during sports activities. If you have firearms in your house, make sure you follow all gun safety procedures. What's next? Go to your health care provider once a year for an annual wellness visit. Ask your health care provider how often you should have your eyes and teeth checked. Stay up to date on all vaccines. This information is not intended to replace advice given to you by your health care provider. Make sure you discuss any questions you have with your health care provider. Document Revised: 12/15/2020 Document Reviewed: 12/15/2020 Elsevier Patient Education  2024 Elsevier Inc.    Signed,   Reyes Pines, MD [facility], [facility] 12/27/23 11:20 AM

## 2023-12-29 ENCOUNTER — Encounter: Payer: Self-pay | Admitting: Family Medicine

## 2023-12-30 ENCOUNTER — Ambulatory Visit: Payer: Self-pay | Admitting: Family Medicine

## 2024-03-01 ENCOUNTER — Other Ambulatory Visit: Payer: Self-pay | Admitting: Family Medicine

## 2024-03-01 DIAGNOSIS — E559 Vitamin D deficiency, unspecified: Secondary | ICD-10-CM

## 2024-04-19 ENCOUNTER — Other Ambulatory Visit: Payer: Self-pay | Admitting: Family Medicine

## 2024-04-19 DIAGNOSIS — E559 Vitamin D deficiency, unspecified: Secondary | ICD-10-CM

## 2024-04-21 NOTE — Telephone Encounter (Signed)
 Requested Prescriptions   Pending Prescriptions Disp Refills   Vitamin D , Ergocalciferol , (DRISDOL ) 1.25 MG (50000 UNIT) CAPS capsule [Pharmacy Med Name: Vitamin D  (Ergocalciferol ) 1.25 MG (50000 UT) Oral Capsule] 15 capsule 2    Sig: TAKE 1 CAPSULE BY MOUTH EVERY 7  DAYS     Date of patient request: 04/21/24 Last office visit: 12/27/2023 Upcoming visit: 06/30/2024 Date of last refill: 12/27/23 Last refill amount: 15 with 2 refills

## 2024-05-05 ENCOUNTER — Encounter: Payer: Self-pay | Admitting: Radiology

## 2024-06-13 ENCOUNTER — Other Ambulatory Visit: Payer: Self-pay | Admitting: Podiatry

## 2024-06-13 ENCOUNTER — Encounter: Payer: Self-pay | Admitting: Podiatry

## 2024-06-13 ENCOUNTER — Ambulatory Visit: Admitting: Podiatry

## 2024-06-13 DIAGNOSIS — M79672 Pain in left foot: Secondary | ICD-10-CM | POA: Diagnosis not present

## 2024-06-13 DIAGNOSIS — B351 Tinea unguium: Secondary | ICD-10-CM | POA: Diagnosis not present

## 2024-06-13 DIAGNOSIS — M79671 Pain in right foot: Secondary | ICD-10-CM | POA: Diagnosis not present

## 2024-06-13 DIAGNOSIS — B353 Tinea pedis: Secondary | ICD-10-CM

## 2024-06-13 MED ORDER — TERBINAFINE HCL 250 MG PO TABS: 250.0000 mg | ORAL_TABLET | Freq: Every day | ORAL | 1 refills | Status: AC

## 2024-06-13 MED ORDER — TERBINAFINE HCL 250 MG PO TABS: 250.0000 mg | ORAL_TABLET | Freq: Every day | ORAL | 0 refills | Status: DC

## 2024-06-13 NOTE — Progress Notes (Signed)
° °  Subjective:    HPI Presents with complaint of thick painful nails that causes discomfort with  walking and wearing shoes.  Have been this way for many years and have been getting worse.  Nails have sometimes come off and grown back the same way.   Objective:  Physical Exam   General: AAO x3, NAD  Vascular: DP and PT pulses palpable bilaterally.  Immedate capillary fill time digits. No significant lower extremity edema bilaterally.  Dermatological:  Onychomycotic mycotic changes nails 1 through 5 with discoloration nail and subungual debris and thickening of the nail with redness along the nail folds.  Tenderness with pressure on nail plates..  Dry scaly erythematous rash feet bilaterally in a moccasin distribution.  Neruologic:  Grossly intact B/L.  Light touch intact bilaterally normal Achilles tendon reflex bilaterally  Musculoskeletal:  Normal lower extremity muscle strength.  Assessment:  Painful onychomycotic nails 1 through 5 bilaterally. Pain feet b/l 3.  tinea pedis bilateral    Plan:  - New patient office visit level 3 for evaluation and management -Discussed with with patient onychomycosis and etiology and treatment.  Discussed topical versus oral agents.  Discussed risk and benefits of both.  No history of hepatic or renal disease.  Patient would like to start oral Lamisil treatment.  Explained that we would check labs every 6 weeks during course of treatment to monitor for any side effects. -Rx: Lamisil 250 mg p.o. daily, refill x 1 -Labs ordered today for liver function tests to monitor for any hepatic side effects from the Lamisil.  Return 6 weeks Lamisil 3

## 2024-06-14 LAB — HEPATIC FUNCTION PANEL
ALT: 37 IU/L (ref 0–44)
AST: 49 IU/L — ABNORMAL HIGH (ref 0–40)
Albumin: 4 g/dL (ref 3.8–4.9)
Alkaline Phosphatase: 119 IU/L (ref 47–123)
Bilirubin Total: 0.4 mg/dL (ref 0.0–1.2)
Bilirubin, Direct: 0.16 mg/dL (ref 0.00–0.40)
Total Protein: 7.6 g/dL (ref 6.0–8.5)

## 2024-06-30 ENCOUNTER — Ambulatory Visit: Admitting: Family Medicine

## 2024-07-10 ENCOUNTER — Ambulatory Visit (INDEPENDENT_AMBULATORY_CARE_PROVIDER_SITE_OTHER): Admitting: *Deleted

## 2024-07-10 VITALS — Ht 72.0 in | Wt 372.0 lb

## 2024-07-10 DIAGNOSIS — Z0000 Encounter for general adult medical examination without abnormal findings: Secondary | ICD-10-CM | POA: Diagnosis not present

## 2024-07-10 NOTE — Progress Notes (Signed)
 "  Chief Complaint  Patient presents with   Medicare Wellness      No voiced or noted concerns at this time Patient advised to keep follow-up appointment with PCP (07-23-2024)       Patient would like to discuss getting a referral for a therapist  he states he doesn't feel depressed but his wife said she has notice that he seems down since he isn't able to work .  Discussed immunizations will get at visit on 07-23-2024    Subjective:   Craig Guzman. is a 54 y.o. male who presents for a Medicare Annual Wellness Visit.  Visit info / Clinical Intake: Medicare Wellness Visit Type:: Subsequent Annual Wellness Visit Persons participating in visit and providing information:: patient Medicare Wellness Visit Mode:: Telephone If telephone:: video declined Since this visit was completed virtually, some vitals may be partially provided or unavailable. Missing vitals are due to the limitations of the virtual format.: Unable to obtain vitals - no equipment If Telephone or Video please confirm:: I connected with patient using audio/video enable telemedicine. I verified patient identity with two identifiers, discussed telehealth limitations, and patient agreed to proceed. Patient Location:: home Provider Location:: home Interpreter Needed?: No Pre-visit prep was completed: no AWV questionnaire completed by patient prior to visit?: no Living arrangements:: lives with spouse/significant other Patient's Overall Health Status Rating: (!) fair Typical amount of pain: some Does pain affect daily life?: no Are you currently prescribed opioids?: no  Dietary Habits and Nutritional Risks How many meals a day?: 2 Eats fruit and vegetables daily?: (!) no Most meals are obtained by: eating out; preparing own meals In the last 2 weeks, have you had any of the following?: none Diabetic:: no  Functional Status Activities of Daily Living (to include ambulation/medication): Independent Ambulation:  Independent Medication Administration: Independent Home Management (perform basic housework or laundry): Independent Manage your own finances?: yes Primary transportation is: driving Concerns about vision?: no *vision screening is required for WTM* Concerns about hearing?: no  Fall Screening Falls in the past year?: 0 Number of falls in past year: 0 Was there an injury with Fall?: 0 Fall Risk Category Calculator: 0 Patient Fall Risk Level: Low Fall Risk  Fall Risk Patient at Risk for Falls Due to: No Fall Risks Fall risk Follow up: Falls evaluation completed; Education provided; Falls prevention discussed  Home and Transportation Safety: All rugs have non-skid backing?: N/A, no rugs All stairs or steps have railings?: yes Grab bars in the bathtub or shower?: yes Have non-skid surface in bathtub or shower?: (!) no Good home lighting?: yes Regular seat belt use?: yes Hospital stays in the last year:: no  Cognitive Assessment Difficulty concentrating, remembering, or making decisions? : no Will 6CIT or Mini Cog be Completed: yes What year is it?: 0 points What month is it?: 0 points Give patient an address phrase to remember (5 components): Its very sunny outside today in January About what time is it?: 0 points Count backwards from 20 to 1: 0 points Say the months of the year in reverse: 0 points Repeat the address phrase from earlier: 0 points 6 CIT Score: 0 points  Advance Directives (For Healthcare) Does Patient Have a Medical Advance Directive?: No Would patient like information on creating a medical advance directive?: No - Patient declined  Reviewed/Updated  Reviewed/Updated: Reviewed All (Medical, Surgical, Family, Medications, Allergies, Care Teams, Patient Goals); Surgical History; Family History; Medications; Allergies; Care Teams; Patient Goals; Medical History    Allergies (  verified) Zithromax [azithromycin], Lisinopril , and Tessalon  perles [benzonatate ]    Current Medications (verified) Outpatient Encounter Medications as of 07/10/2024  Medication Sig   Calcium Carb-Cholecalciferol (CALCIUM 1000 + D PO) Take by mouth.   cetirizine  (ZYRTEC ) 10 MG tablet Take 1 tablet (10 mg total) by mouth 2 (two) times daily.   cyanocobalamin  (VITAMIN B12) 1000 MCG/ML injection Inject 1 mL (1,000 mcg total) into the muscle every 30 (thirty) days.   cyclobenzaprine  (FLEXERIL ) 10 MG tablet Take 0.5-1 tablets (5-10 mg total) by mouth 3 (three) times daily as needed for muscle spasms (back pain.).   doxazosin  (CARDURA ) 8 MG tablet Take 1 tablet (8 mg total) by mouth at bedtime.   montelukast  (SINGULAIR ) 10 MG tablet TAKE 1 TABLET BY MOUTH EVERYDAY AT BEDTIME   Multiple Vitamins-Calcium (MULTI-DAY/CALCIUM/EXTRA IRON PO) Take by mouth.   olmesartan  (BENICAR ) 20 MG tablet Take 1 tablet (20 mg total) by mouth daily.   Olopatadine-Mometasone (RYALTRIS ) 665-25 MCG/ACT SUSP Place 2 sprays into the nose 2 (two) times daily as needed (runny/stuffy nose).   ondansetron  (ZOFRAN -ODT) 8 MG disintegrating tablet Take 1 tablet (8 mg total) by mouth every 8 (eight) hours as needed for nausea or vomiting.   rizatriptan  (MAXALT -MLT) 10 MG disintegrating tablet TAKE 1 TABLET 3 TIME A DA Y AS NEEDED FOR MIGRAINE   terbinafine  (LAMISIL ) 250 MG tablet Take 1 tablet (250 mg total) by mouth daily.   verapamil  (CALAN -SR) 180 MG CR tablet Take 2 tablets (360 mg total) by mouth daily.   Vitamin D , Ergocalciferol , (DRISDOL ) 1.25 MG (50000 UNIT) CAPS capsule TAKE 1 CAPSULE BY MOUTH EVERY 7  DAYS   No facility-administered encounter medications on file as of 07/10/2024.    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    Pneumonia 02/27/2014   ED   Postsurgical dumping syndrome 02/14/2019   Sleep apnea    has cpap- does not wear    Past Surgical History:  Procedure Laterality Date   BALLOON DILATION N/A 12/06/2017   Procedure: BALLOON DILATION;  Surgeon: Avram Lupita BRAVO, MD;  Location: [facility];  Service: Endoscopy;  Laterality: N/A;   BIOPSY  06/21/2018   Procedure: BIOPSY;  Surgeon: Avram Lupita BRAVO, MD;  Location: [facility];  Service: Endoscopy;;   CHOLECYSTECTOMY  2011   [place]   CIRCUMCISION N/A 12/09/2018   Procedure: CIRCUMCISION ADULT;  Surgeon: Carolee Sherwood JONETTA DOUGLAS, MD;  Location: [facility];  Service: Urology;  Laterality: N/A;   COLONOSCOPY  07/26/11   Crohn's colitis, ileitis   ESOPHAGOGASTRODUODENOSCOPY  05/04/11, 07/26/11   granulomatous gastritis - Crohn's   ESOPHAGOGASTRODUODENOSCOPY (EGD) WITH PROPOFOL  N/A 03/28/2017   Procedure: ESOPHAGOGASTRODUODENOSCOPY (EGD) WITH PROPOFOL   with balloon dilation of duodenum;  Surgeon: Leigh Elspeth SQUIBB, MD;  Location: [facility];  Service: Gastroenterology;  Laterality: N/A;   ESOPHAGOGASTRODUODENOSCOPY (EGD) WITH PROPOFOL  N/A  12/06/2017   Procedure: ESOPHAGOGASTRODUODENOSCOPY (EGD) WITH PROPOFOL ;  Surgeon: Avram Lupita BRAVO, MD;  Location: [facility];  Service: Endoscopy;  Laterality: N/A;   ESOPHAGOGASTRODUODENOSCOPY (EGD) WITH PROPOFOL  N/A 06/21/2018   Procedure: ESOPHAGOGASTRODUODENOSCOPY (EGD) WITH PROPOFOL ;  Surgeon: Avram Lupita BRAVO, MD;  Location: [facility];  Service: Endoscopy;  Laterality: N/A;   ETHMOIDECTOMY N/A 11/27/2018   Procedure: ENDOSCOPIC TOTAL ETHMOIDECTOMY;  Surgeon: Karis Clunes, MD;  Location: [facility];  Service: ENT;  Laterality: N/A;   FLEXIBLE SIGMOIDOSCOPY      GASTROJEJUNOSTOMY  01/2018   MULTIPLE TOOTH EXTRACTIONS  2005   NASAL SEPTOPLASTY W/ TURBINOPLASTY Bilateral 11/27/2018   Procedure: NASAL SEPTOPLASTY WITH BILATERAL TURBINATE REDUCTION;  Surgeon: Karis Clunes, MD;  Location: [facility];  Service: ENT;  Laterality: Bilateral;   SHOULDER SURGERY     right   SINUS ENDO WITH FUSION N/A 11/27/2018   Procedure: SINUS ENDO WITH FUSION;  Surgeon: Karis Clunes, MD;  Location: [facility];  Service: ENT;  Laterality: N/A;   UPPER GASTROINTESTINAL ENDOSCOPY     VASECTOMY     bilateral w/lysis of penile adhesions   Family History  Problem Relation Age of Onset   Colon polyps Father    Diabetes Father    Hypertension Father    Hypertension Mother    Asthma Mother    Heart disease Maternal Grandmother    Colon cancer Neg Hx    Rectal cancer Neg Hx    Stomach cancer Neg Hx    Esophageal cancer Neg Hx    Social History   Occupational History   Occupation: Set Designer: [employer]  Tobacco Use   Smoking status: Former    Current packs/day: 0.00    Average packs/day: 0.8 packs/day for 21.0 years (15.8 ttl pk-yrs)    Types: Cigarettes    Start date: 12/01/1996    Quit date: 12/01/2017    Years since quitting: 6.6    Passive exposure: Current (once in a while)   Smokeless tobacco: Former    Types: Snuff   Tobacco comments:    Counseling sheet given in exam room   Vaping Use   Vaping status: Never Used  Substance and Sexual Activity   Alcohol use: Not Currently   Drug use: No   Sexual activity: Yes    Partners: Female   Tobacco Counseling Counseling given: Not Answered Tobacco comments: Counseling sheet given in exam room   SDOH Screenings   Food Insecurity: No Food Insecurity (07/10/2024)  Housing: Unknown (07/10/2024)  Transportation Needs: No Transportation Needs (07/10/2024)  Utilities: Not At Risk (07/10/2024)  Depression (PHQ2-9): Low Risk (07/10/2024)  Financial Resource Strain: Low Risk (06/29/2024)  Physical Activity: Insufficiently Active  (07/10/2024)  Social Connections: Moderately Integrated (07/10/2024)  Stress: No Stress Concern Present (07/10/2024)  Tobacco Use: Medium Risk (07/10/2024)  Health Literacy: Adequate Health Literacy (07/10/2024)   See flowsheets for full screening details  Depression Screen PHQ 2 & 9 Depression Scale- Over the past 2 weeks, how often have you been bothered by any of the following problems? Little interest or pleasure in doing things: 0 Feeling down, depressed, or hopeless (PHQ Adolescent also includes...irritable): 1 PHQ-2 Total Score: 1 Trouble falling or staying asleep, or sleeping too much: 2 Feeling tired or having little energy: 0 Poor appetite or overeating (PHQ Adolescent also includes...weight loss): 0 Feeling bad about yourself - or that you are a failure or have let yourself or your family down: 0 Trouble concentrating on things, such as reading the  newspaper or watching television [facility] Adolescent also includes...like school work): 0 Moving or speaking so slowly that other people could have noticed. Or the opposite - being so fidgety or restless that you have been moving around a lot more than usual: 0 Thoughts that you would be better off dead, or of hurting yourself in some way: 0 PHQ-9 Total Score: 3 If you checked off any problems, how difficult have these problems made it for you to do your work, take care of things at home, or get along with other people?: Not difficult at all     Goals Addressed             This Visit's Progress    Weight (lb) < 200 lb (90.7 kg)   372 lb (168.7 kg)            Objective:    Today's Vitals   07/10/24 1257  Weight: (!) 372 lb (168.7 kg)  Height: 6' (1.829 m)   Body mass index is 50.45 kg/m.  Hearing/Vision screen Hearing Screening - Comments:: No trouble hearing Vision Screening - Comments:: [facility]    Not up to date Immunizations and Health Maintenance Health Maintenance  Topic Date Due   Hepatitis B Vaccines 19-59  Average Risk (1 of 3 - 19+ 3-dose series) Never done   Influenza Vaccine  02/01/2024   Zoster Vaccines- Shingrix  (2 of 2) 02/21/2024   COVID-19 Vaccine (4 - 2025-26 season) 03/03/2024   Medicare Annual Wellness (AWV)  07/10/2025   Colonoscopy  01/25/2028   DTaP/Tdap/Td (3 - Td or Tdap) 09/29/2031   Pneumococcal Vaccine: 50+ Years  Completed   Hepatitis C Screening  Completed   HIV Screening  Completed   HPV VACCINES  Aged Out   Meningococcal B Vaccine  Aged Out        Assessment/Plan:  This is a routine wellness examination for [facility].  Patient Care Team: Levora Reyes SAUNDERS, MD as PCP - General (Family Medicine) Kristie Lamprey, MD (Gastroenterology) Lily Boas, MD (General Surgery) Alline Lenis, MD (Inactive) (Urology) Frederik Clarity, MD as Referring Physician (Colon and Rectal Surgery) Avram Lupita BRAVO, MD as Consulting Physician (Gastroenterology)  I have personally reviewed and noted the following in the patients chart:   Medical and social history Use of alcohol, tobacco or illicit drugs  Current medications and supplements including opioid prescriptions. Functional ability and status Nutritional status Physical activity Advanced directives List of other physicians Hospitalizations, surgeries, and ER visits in previous 12 months Vitals Screenings to include cognitive, depression, and falls Referrals and appointments  No orders of the defined types were placed in this encounter.  In addition, I have reviewed and discussed with patient certain preventive protocols, quality metrics, and best practice recommendations. A written personalized care plan for preventive services as well as general preventive health recommendations were provided to patient.   Sierria Bruney, LPN   02/01/7972   Return in 1 year (on 07/10/2025).  After Visit Summary: (MyChart) Due to this being a telephonic visit, the after visit summary with patients personalized plan was offered to patient via  MyChart   Nurse Notes:  "

## 2024-07-10 NOTE — Patient Instructions (Signed)
 Craig Guzman,  Thank you for taking the time for your Medicare Wellness Visit. I appreciate your continued commitment to your health goals. Please review the care plan we discussed, and feel free to reach out if I can assist you further.  Please note that Annual Wellness Visits do not include a physical exam. Some assessments may be limited, especially if the visit was conducted virtually. If needed, we may recommend an in-person follow-up with your provider.  Ongoing Care Seeing your primary care provider every 3 to 6 months helps us  monitor your health and provide consistent, personalized care.   Referrals If a referral was made during today's visit and you haven't received any updates within two weeks, please contact the referred provider directly to check on the status.  Recommended Screenings:  Health Maintenance  Topic Date Due   Hepatitis B Vaccine (1 of 3 - 19+ 3-dose series) Never done   Flu Shot  02/01/2024   Zoster (Shingles) Vaccine (2 of 2) 02/21/2024   COVID-19 Vaccine (4 - 2025-26 season) 03/03/2024   Medicare Annual Wellness Visit  07/10/2025   Colon Cancer Screening  01/25/2028   DTaP/Tdap/Td vaccine (3 - Td or Tdap) 09/29/2031   Pneumococcal Vaccine for age over 91  Completed   Hepatitis C Screening  Completed   HIV Screening  Completed   HPV Vaccine  Aged Out   Meningitis B Vaccine  Aged Out       07/10/2024   12:58 PM  Advanced Directives  Does Patient Have a Medical Advance Directive? No  Would patient like information on creating a medical advance directive? No - Patient declined    Vision: Annual vision screenings are recommended for early detection of glaucoma, cataracts, and diabetic retinopathy. These exams can also reveal signs of chronic conditions such as diabetes and high blood pressure.  Dental: Annual dental screenings help detect early signs of oral cancer, gum disease, and other conditions linked to overall health, including heart disease and  diabetes.  Please see the attached documents for additional preventive care recommendations.    Craig Guzman , Thank you for taking time to come for your Medicare Wellness Visit. I appreciate your ongoing commitment to your health goals. Please review the following plan we discussed and let me know if I can assist you in the future.   Screening recommendations/referrals: Colonoscopy:  Recommended yearly ophthalmology/optometry visit for glaucoma screening and checkup Recommended yearly dental visit for hygiene and checkup  Vaccinations: Influenza vaccine:  Pneumococcal vaccine:  Tdap vaccine:  Shingles vaccine:     Preventive Care 40-64 Years, Male Preventive care refers to lifestyle choices and visits with your health care provider that can promote health and wellness. What does preventive care include? A yearly physical exam. This is also called an annual well check. Dental exams once or twice a year. Routine eye exams. Ask your health care provider how often you should have your eyes checked. Personal lifestyle choices, including: Daily care of your teeth and gums. Regular physical activity. Eating a healthy diet. Avoiding tobacco and drug use. Limiting alcohol use. Practicing safe sex. Taking low-dose aspirin  every day starting at age 77. What happens during an annual well check? The services and screenings done by your health care provider during your annual well check will depend on your age, overall health, lifestyle risk factors, and family history of disease. Counseling  Your health care provider may ask you questions about your: Alcohol use. Tobacco use. Drug use. Emotional well-being. Home and  relationship well-being. Sexual activity. Eating habits. Work and work astronomer. Screening  You may have the following tests or measurements: Height, weight, and BMI. Blood pressure. Lipid and cholesterol levels. These may be checked every 5 years, or more frequently  if you are over 45 years old. Skin check. Lung cancer screening. You may have this screening every year starting at age 87 if you have a 30-pack-year history of smoking and currently smoke or have quit within the past 15 years. Fecal occult blood test (FOBT) of the stool. You may have this test every year starting at age 108. Flexible sigmoidoscopy or colonoscopy. You may have a sigmoidoscopy every 5 years or a colonoscopy every 10 years starting at age 24. Prostate cancer screening. Recommendations will vary depending on your family history and other risks. Hepatitis C blood test. Hepatitis B blood test. Sexually transmitted disease (STD) testing. Diabetes screening. This is done by checking your blood sugar (glucose) after you have not eaten for a while (fasting). You may have this done every 1-3 years. Discuss your test results, treatment options, and if necessary, the need for more tests with your health care provider. Vaccines  Your health care provider may recommend certain vaccines, such as: Influenza vaccine. This is recommended every year. Tetanus, diphtheria, and acellular pertussis (Tdap, Td) vaccine. You may need a Td booster every 10 years. Zoster vaccine. You may need this after age 30. Pneumococcal 13-valent conjugate (PCV13) vaccine. You may need this if you have certain conditions and have not been vaccinated. Pneumococcal polysaccharide (PPSV23) vaccine. You may need one or two doses if you smoke cigarettes or if you have certain conditions. Talk to your health care provider about which screenings and vaccines you need and how often you need them. This information is not intended to replace advice given to you by your health care provider. Make sure you discuss any questions you have with your health care provider. Document Released: 07/16/2015 Document Revised: 03/08/2016 Document Reviewed: 04/20/2015 Elsevier Interactive Patient Education  2017 [employer].  Fall  Prevention in the Home Falls can cause injuries. They can happen to people of all ages. There are many things you can do to make your home safe and to help prevent falls. What can I do on the outside of my home? Regularly fix the edges of walkways and driveways and fix any cracks. Remove anything that might make you trip as you walk through a door, such as a raised step or threshold. Trim any bushes or trees on the path to your home. Use bright outdoor lighting. Clear any walking paths of anything that might make someone trip, such as rocks or tools. Regularly check to see if handrails are loose or broken. Make sure that both sides of any steps have handrails. Any raised decks and porches should have guardrails on the edges. Have any leaves, snow, or ice cleared regularly. Use sand or salt on walking paths during winter. Clean up any spills in your garage right away. This includes oil or grease spills. What can I do in the bathroom? Use night lights. Install grab bars by the toilet and in the tub and shower. Do not use towel bars as grab bars. Use non-skid mats or decals in the tub or shower. If you need to sit down in the shower, use a plastic, non-slip stool. Keep the floor dry. Clean up any water that spills on the floor as soon as it happens. Remove soap buildup in the tub or shower  regularly. Attach bath mats securely with double-sided non-slip rug tape. Do not have throw rugs and other things on the floor that can make you trip. What can I do in the bedroom? Use night lights. Make sure that you have a light by your bed that is easy to reach. Do not use any sheets or blankets that are too big for your bed. They should not hang down onto the floor. Have a firm chair that has side arms. You can use this for support while you get dressed. Do not have throw rugs and other things on the floor that can make you trip. What can I do in the kitchen? Clean up any spills right away. Avoid  walking on wet floors. Keep items that you use a lot in easy-to-reach places. If you need to reach something above you, use a strong step stool that has a grab bar. Keep electrical cords out of the way. Do not use floor polish or wax that makes floors slippery. If you must use wax, use non-skid floor wax. Do not have throw rugs and other things on the floor that can make you trip. What can I do with my stairs? Do not leave any items on the stairs. Make sure that there are handrails on both sides of the stairs and use them. Fix handrails that are broken or loose. Make sure that handrails are as long as the stairways. Check any carpeting to make sure that it is firmly attached to the stairs. Fix any carpet that is loose or worn. Avoid having throw rugs at the top or bottom of the stairs. If you do have throw rugs, attach them to the floor with carpet tape. Make sure that you have a light switch at the top of the stairs and the bottom of the stairs. If you do not have them, ask someone to add them for you. What else can I do to help prevent falls? Wear shoes that: Do not have high heels. Have rubber bottoms. Are comfortable and fit you well. Are closed at the toe. Do not wear sandals. If you use a stepladder: Make sure that it is fully opened. Do not climb a closed stepladder. Make sure that both sides of the stepladder are locked into place. Ask someone to hold it for you, if possible. Clearly mark and make sure that you can see: Any grab bars or handrails. First and last steps. Where the edge of each step is. Use tools that help you move around (mobility aids) if they are needed. These include: Canes. Walkers. Scooters. Crutches. Turn on the lights when you go into a dark area. Replace any light bulbs as soon as they burn out. Set up your furniture so you have a clear path. Avoid moving your furniture around. If any of your floors are uneven, fix them. If there are any pets around  you, be aware of where they are. Review your medicines with your doctor. Some medicines can make you feel dizzy. This can increase your chance of falling. Ask your doctor what other things that you can do to help prevent falls. This information is not intended to replace advice given to you by your health care provider. Make sure you discuss any questions you have with your health care provider. Document Released: 04/15/2009 Document Revised: 11/25/2015 Document Reviewed: 07/24/2014 Elsevier Interactive Patient Education  2017 [employer].

## 2024-07-23 ENCOUNTER — Ambulatory Visit: Admitting: Family Medicine

## 2024-07-23 ENCOUNTER — Other Ambulatory Visit (HOSPITAL_COMMUNITY)
Admission: RE | Admit: 2024-07-23 | Discharge: 2024-07-23 | Disposition: A | Source: Ambulatory Visit | Attending: Family Medicine | Admitting: Family Medicine

## 2024-07-23 ENCOUNTER — Encounter: Payer: Self-pay | Admitting: Family Medicine

## 2024-07-23 VITALS — BP 138/88 | HR 95 | Temp 98.0°F | Resp 22 | Ht 72.0 in | Wt 375.8 lb

## 2024-07-23 DIAGNOSIS — K50019 Crohn's disease of small intestine with unspecified complications: Secondary | ICD-10-CM | POA: Diagnosis not present

## 2024-07-23 DIAGNOSIS — L918 Other hypertrophic disorders of the skin: Secondary | ICD-10-CM | POA: Insufficient documentation

## 2024-07-23 DIAGNOSIS — R351 Nocturia: Secondary | ICD-10-CM

## 2024-07-23 DIAGNOSIS — I10 Essential (primary) hypertension: Secondary | ICD-10-CM

## 2024-07-23 DIAGNOSIS — Z23 Encounter for immunization: Secondary | ICD-10-CM

## 2024-07-23 DIAGNOSIS — R4584 Anhedonia: Secondary | ICD-10-CM

## 2024-07-23 DIAGNOSIS — E559 Vitamin D deficiency, unspecified: Secondary | ICD-10-CM | POA: Diagnosis not present

## 2024-07-23 DIAGNOSIS — Z6841 Body Mass Index (BMI) 40.0 and over, adult: Secondary | ICD-10-CM

## 2024-07-23 DIAGNOSIS — R7303 Prediabetes: Secondary | ICD-10-CM

## 2024-07-23 LAB — LIPID PANEL
Cholesterol: 173 mg/dL (ref 28–200)
HDL: 33.6 mg/dL — ABNORMAL LOW
LDL Cholesterol: 109 mg/dL — ABNORMAL HIGH (ref 10–99)
NonHDL: 139.27
Total CHOL/HDL Ratio: 5
Triglycerides: 153 mg/dL — ABNORMAL HIGH (ref 10.0–149.0)
VLDL: 30.6 mg/dL (ref 0.0–40.0)

## 2024-07-23 LAB — COMPREHENSIVE METABOLIC PANEL WITH GFR
ALT: 31 U/L (ref 3–53)
AST: 36 U/L (ref 5–37)
Albumin: 3.9 g/dL (ref 3.5–5.2)
Alkaline Phosphatase: 105 U/L (ref 39–117)
BUN: 14 mg/dL (ref 6–23)
CO2: 25 meq/L (ref 19–32)
Calcium: 9.1 mg/dL (ref 8.4–10.5)
Chloride: 104 meq/L (ref 96–112)
Creatinine, Ser: 1.18 mg/dL (ref 0.40–1.50)
GFR: 70.93 mL/min
Glucose, Bld: 111 mg/dL — ABNORMAL HIGH (ref 70–99)
Potassium: 4.3 meq/L (ref 3.5–5.1)
Sodium: 138 meq/L (ref 135–145)
Total Bilirubin: 0.4 mg/dL (ref 0.2–1.2)
Total Protein: 7.6 g/dL (ref 6.0–8.3)

## 2024-07-23 LAB — HEMOGLOBIN A1C: Hgb A1c MFr Bld: 6.2 % (ref 4.6–6.5)

## 2024-07-23 MED ORDER — VITAMIN D (ERGOCALCIFEROL) 1.25 MG (50000 UNIT) PO CAPS: 50000.0000 [IU] | ORAL_CAPSULE | ORAL | 2 refills | Status: AC

## 2024-07-23 MED ORDER — VERAPAMIL HCL ER 180 MG PO TBCR: 360.0000 mg | EXTENDED_RELEASE_TABLET | Freq: Every day | ORAL | 3 refills | Status: AC

## 2024-07-23 MED ORDER — DOXAZOSIN MESYLATE 8 MG PO TABS: 8.0000 mg | ORAL_TABLET | Freq: Every day | ORAL | 2 refills | Status: AC

## 2024-07-23 NOTE — Progress Notes (Signed)
 "  Subjective:  Patient ID: Craig Guzman., male    DOB: Aug 26, 1970  Age: 54 y.o. MRN: [id number]  CC:  Chief Complaint  Patient presents with   Follow-up    6 month follow up. Patient would like a referral for a therapist. Referral to dermatology to remove skin tags    HPI Matheson Vandehei. presents for   Hypertension with morbid obesity.  Doxazosin  8 mg at bedtime which has also been helpful for nocturia, verapamil  360 mg daily, olmesartan  20 mg daily.  Denies any side effects with meds.  BP has been borderline in the 130/80 range. Home readings: 135/80's, up to 90's at times.  BP Readings from Last 3 Encounters:  07/23/24 138/88  12/27/23 136/80  06/25/23 (!) 154/80   Lab Results  Component Value Date   CREATININE 1.17 12/27/2023   Prediabetes: Weight has increased slightly since last visit.  He has met with a nutritionist previously, with some difficulty with diet given postsurgical dumping syndrome, bariatric supplement.  Some limitations with exercise previously.  Low intensity exercise has been discussed, with some walking.  Less walking with colder weather. Eating more protein, protein shakes. Still having to wathc diet with dumping syndrome. Better with sitting up after eating - small portions.  Lab Results  Component Value Date   HGBA1C 6.1 12/27/2023   Wt Readings from Last 3 Encounters:  07/23/24 (!) 375 lb 12.8 oz (170.5 kg)  07/10/24 (!) 372 lb (168.7 kg)  12/27/23 (!) 372 lb 12.8 oz (169.1 kg)   Vitamin D  deficiency 50,000 units weekly.  Had been off meds for a few weeks and then return to weekly dosing when discussed last year.  Labs planned today. No recent missed doses.  Last vitamin D  Lab Results  Component Value Date   VD25OH 20.48 (L) 12/27/2023   B12 deficiency Has received monthly B12 injections, at GI.  Lab Results  Component Value Date   VITAMINB12 382 12/27/2023   Additional new concerns today:  Skin tags Left forearm and left side of  abdomen are most symptomatic - catches on clothing. Would like to have removed. Left forearm lesion increased in size over time.   Depressed mood, anhedonia, Spouse has noticed that he is down at times, decreased interest in activity. Has requested to meet with a therapist.  No current medications for mood disorder. Denies recent health or life changes.     07/23/2024    8:52 AM 07/10/2024    1:01 PM 12/27/2023   10:31 AM 04/09/2023   11:06 AM 10/02/2022   10:20 AM  Depression screen PHQ 2/9  Decreased Interest 1 0 1 1 0  Down, Depressed, Hopeless 0 1 0 0 0  PHQ - 2 Score 1 1 1 1  0  Altered sleeping 2 2 0 0 0  Tired, decreased energy 2 0 2 2 1   Change in appetite 3 0 0 0 3  Feeling bad or failure about yourself  0 0 0 0 0  Trouble concentrating 1 0 0 0 0  Moving slowly or fidgety/restless 0 0 0 0 0  Suicidal thoughts 0  0 0 0  PHQ-9 Score 9 3 3  3  4    Difficult doing work/chores Not difficult at all Not difficult at all Not difficult at all       Data saved with a previous flowsheet row definition     History Patient Active Problem List   Diagnosis Date Noted   Low back pain  06/05/2023   Seasonal and perennial allergic rhinitis 09/07/2022   Allergic conjunctivitis of both eyes 09/07/2022   Feeding problems 12/02/2021   Nocturia 09/23/2021   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 03/21/2019   Postsurgical dumping syndrome 02/14/2019   S/P functional endoscopic sinus surgery 11/27/2018   Abdominal pain, epigastric    Angioedema 04/19/2018   Intractable vomiting with nausea    Cigarette nicotine dependence without complication 05/15/2016   Shifting sleep-work schedule, affecting sleep 05/15/2016   Sleep deprivation 05/15/2016   Sleep related hypoventilation in conditions  classified elsewhere 05/15/2016   Sleep related headaches 05/15/2016   Nausea 12/31/2013   Morbid obesity (HCC) 09/19/2013   Diarrhea 07/10/2013   Colloid thyroid  nodule 04/04/2013   Elevated blood pressure reading in office with diagnosis of hypertension 11/26/2012   Gastroparesis??? 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   History of migraine headaches    HTN (hypertension)    Kidney stone 09/20/2012   Migraine    Obesity    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    Pneumonia 02/27/2014   ED   Postsurgical dumping syndrome 02/14/2019   Sleep apnea    has cpap- does not wear    Past Surgical History:  Procedure Laterality Date   BALLOON DILATION N/A 12/06/2017   Procedure: BALLOON DILATION;  Surgeon: Avram Lupita BRAVO, MD;  Location: [facility];  Service: Endoscopy;  Laterality: N/A;   BIOPSY  06/21/2018   Procedure: BIOPSY;  Surgeon: Avram Lupita BRAVO, MD;  Location: [facility];  Service: Endoscopy;;   CHOLECYSTECTOMY  2011   [place]   CIRCUMCISION N/A 12/09/2018   Procedure: CIRCUMCISION ADULT;  Surgeon: Carolee Sherwood JONETTA DOUGLAS, MD;  Location: [facility];  Service: Urology;  Laterality: N/A;   COLONOSCOPY  07/26/11   Crohn's colitis, ileitis   ESOPHAGOGASTRODUODENOSCOPY  05/04/11, 07/26/11   granulomatous gastritis - Crohn's   ESOPHAGOGASTRODUODENOSCOPY (EGD) WITH PROPOFOL  N/A 03/28/2017   Procedure: ESOPHAGOGASTRODUODENOSCOPY (EGD) WITH PROPOFOL   with  balloon dilation of duodenum;  Surgeon: Leigh Elspeth SQUIBB, MD;  Location: [facility];  Service: Gastroenterology;  Laterality: N/A;   ESOPHAGOGASTRODUODENOSCOPY (EGD) WITH PROPOFOL  N/A 12/06/2017   Procedure: ESOPHAGOGASTRODUODENOSCOPY (EGD) WITH PROPOFOL ;  Surgeon: Avram Lupita BRAVO, MD;  Location: [facility];  Service: Endoscopy;  Laterality: N/A;   ESOPHAGOGASTRODUODENOSCOPY (EGD) WITH PROPOFOL  N/A 06/21/2018   Procedure: ESOPHAGOGASTRODUODENOSCOPY (EGD) WITH PROPOFOL ;  Surgeon: Avram Lupita BRAVO, MD;  Location: [facility];  Service: Endoscopy;  Laterality: N/A;   ETHMOIDECTOMY N/A 11/27/2018   Procedure: ENDOSCOPIC TOTAL ETHMOIDECTOMY;  Surgeon: Karis Clunes, MD;  Location: [facility];  Service: ENT;  Laterality: N/A;   FLEXIBLE SIGMOIDOSCOPY     GASTROJEJUNOSTOMY  01/2018   MULTIPLE TOOTH EXTRACTIONS  2005   NASAL  SEPTOPLASTY W/ TURBINOPLASTY Bilateral 11/27/2018   Procedure: NASAL SEPTOPLASTY WITH BILATERAL TURBINATE REDUCTION;  Surgeon: Karis Clunes, MD;  Location: [facility];  Service: ENT;  Laterality: Bilateral;   SHOULDER SURGERY     right   SINUS ENDO WITH FUSION N/A 11/27/2018   Procedure: SINUS ENDO WITH FUSION;  Surgeon: Karis Clunes, MD;  Location: [facility];  Service: ENT;  Laterality: N/A;   UPPER GASTROINTESTINAL ENDOSCOPY     VASECTOMY     bilateral w/lysis of penile adhesions   Allergies[1] Prior to Admission medications  Medication Sig Start Date End Date Taking? Authorizing Provider  Calcium Carb-Cholecalciferol (CALCIUM 1000 + D PO) Take by mouth.   Yes [provider]  cetirizine  (ZYRTEC ) 10 MG tablet Take 1 tablet (10 mg total) by mouth 2 (two) times daily. 01/12/23  Yes Padgett, Danita Macintosh, MD  cyanocobalamin  (VITAMIN B12) 1000 MCG/ML injection Inject 1 mL (1,000 mcg total) into the muscle every 30 (thirty) days. 07/05/23  Yes Avram Lupita BRAVO, MD  cyclobenzaprine  (FLEXERIL ) 10 MG tablet Take 0.5-1 tablets (5-10 mg total) by mouth 3 (three) times daily as needed for muscle  spasms (back pain.). 05/16/23  Yes Levora Reyes SAUNDERS, MD  doxazosin  (CARDURA ) 8 MG tablet Take 1 tablet (8 mg total) by mouth at bedtime. 12/27/23  Yes Levora Reyes SAUNDERS, MD  montelukast  (SINGULAIR ) 10 MG tablet TAKE 1 TABLET BY MOUTH EVERYDAY AT BEDTIME 02/16/23  Yes Padgett, Danita Macintosh, MD  Multiple Vitamins-Calcium (MULTI-DAY/CALCIUM/EXTRA IRON PO) Take by mouth.   Yes [provider]  olmesartan  (BENICAR ) 20 MG tablet Take 1 tablet (20 mg total) by mouth daily. 12/27/23  Yes Levora Reyes SAUNDERS, MD  Olopatadine-Mometasone (RYALTRIS ) 665-25 MCG/ACT SUSP Place 2 sprays into the nose 2 (two) times daily as needed (runny/stuffy nose). 09/01/22  Yes Padgett, Danita Macintosh, MD  ondansetron  (ZOFRAN -ODT) 8 MG disintegrating tablet Take 1 tablet (8 mg total) by mouth every 8 (eight) hours as needed for nausea or vomiting. 02/14/23  Yes Avram Lupita BRAVO, MD  rizatriptan  (MAXALT -MLT) 10 MG disintegrating tablet TAKE 1 TABLET 3 TIME A DA Y AS NEEDED FOR MIGRAINE 01/26/20  Yes Avram Lupita BRAVO, MD  terbinafine  (LAMISIL ) 250 MG tablet Take 1 tablet (250 mg total) by mouth daily. 06/13/24  Yes Petery, Norleen, DPM  verapamil  (CALAN -SR) 180 MG CR tablet Take 2 tablets (360 mg total) by mouth daily. 12/27/23  Yes Levora Reyes SAUNDERS, MD  Vitamin D , Ergocalciferol , (DRISDOL ) 1.25 MG (50000 UNIT) CAPS capsule TAKE 1 CAPSULE BY MOUTH EVERY 7  DAYS 04/21/24  Yes Levora Reyes SAUNDERS, MD   Social History   Socioeconomic History   Marital status: Married    Spouse name: Not on file   Number of children: 3   Years of education: Some college   Highest education level: Associate degree: occupational, scientist, product/process development, or vocational program  Occupational History   Occupation: Set Designer: [employer]  Tobacco Use   Smoking status: Former    Current packs/day: 0.00    Average packs/day: 0.8 packs/day for 21.0 years (15.8 ttl pk-yrs)    Types: Cigarettes    Start date: 12/01/1996    Quit date: 12/01/2017    Years  since quitting: 6.6    Passive exposure: Current (once in a while)   Smokeless tobacco: Former    Types: Snuff   Tobacco comments:    Counseling sheet given in exam room   Vaping Use   Vaping status: Never Used  Substance and Sexual  Activity   Alcohol use: Not Currently   Drug use: No   Sexual activity: Yes    Partners: Female  Other Topics Concern   Not on file  Social History Narrative   Married. Education: [employer].    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    Right-handed   Caffeine: 3 sodas per day   Occasional alcohol no drugs   Former smoker former snuff user   Social Drivers of Health   Tobacco Use: Medium Risk (07/23/2024)   Patient History    Smoking Tobacco Use: Former    Smokeless Tobacco Use: Former    Passive Exposure: Dispensing Optician: Low Risk (06/29/2024)   Overall Financial Resource Strain (CARDIA)    Difficulty of Paying Living Expenses: Not hard at all  Food Insecurity: No Food Insecurity (07/10/2024)   Epic    Worried About Programme Researcher, Broadcasting/film/video in the Last Year: Never true    Ran Out of Food in the Last Year: Never true  Transportation Needs: No Transportation Needs (07/10/2024)   Epic    Lack of Transportation (Medical): No    Lack of Transportation (Non-Medical): No  Physical Activity: Insufficiently Active (07/10/2024)   Exercise Vital Sign    Days of Exercise per Week: 3 days    Minutes of Exercise per Session: 20 min  Stress: No Stress Concern Present (07/10/2024)   [employer] of Occupational Health - Occupational Stress Questionnaire    Feeling of Stress: Not at all  Social Connections: Moderately Integrated (07/10/2024)   Social Connection and Isolation Panel    Frequency of Communication with Friends and Family: More than three times a week    Frequency of Social Gatherings with Friends and Family: Twice a week    Attends Religious Services: 1 to 4 times per year    Active Member of  [employer] or Organizations: No    Attends Banker Meetings: Never    Marital Status: Married  Catering Manager Violence: Not At Risk (07/10/2024)   Epic    Fear of Current or Ex-Partner: No    Emotionally Abused: No    Physically Abused: No    Sexually Abused: No  Depression (PHQ2-9): Medium Risk (07/23/2024)   Depression (PHQ2-9)    PHQ-2 Score: 9  Alcohol Screen: Not on file  Housing: Unknown (07/10/2024)   Epic    Unable to Pay for Housing in the Last Year: No    Number of Times Moved in the Last Year: Not on file    Homeless in the Last Year: No  Utilities: Not At Risk (07/10/2024)   Epic    Threatened with loss of utilities: No  Health Literacy: Adequate Health Literacy (07/10/2024)   B1300 Health Literacy    Frequency of need for help with medical instructions: Never    Review of Systems  Constitutional:  Negative for fatigue and unexpected weight change.  Eyes:  Negative for visual disturbance.  Respiratory:  Negative for cough, chest tightness and shortness of breath.   Cardiovascular:  Negative for chest pain, palpitations and leg swelling.  Gastrointestinal:  Negative for abdominal pain and blood in stool.  Neurological:  Negative for dizziness, light-headedness and headaches.   Per HPI.   Objective:   Vitals:   07/23/24 0849  BP: 138/88  Pulse: 95  Resp: (!) 22  Temp: 98 F (36.7 C)  TempSrc: Temporal  SpO2: 99%  Weight: (!) 375  lb 12.8 oz (170.5 kg)  Height: 6' (1.829 m)     Physical Exam Vitals reviewed.  Constitutional:      Appearance: He is well-developed.  HENT:     Head: Normocephalic and atraumatic.  Neck:     Vascular: No carotid bruit or JVD.  Cardiovascular:     Rate and Rhythm: Normal rate and regular rhythm.     Heart sounds: Normal heart sounds. No murmur heard. Pulmonary:     Effort: Pulmonary effort is normal.     Breath sounds: Normal breath sounds. No rales.  Musculoskeletal:     Right lower leg: No edema.     Left lower  leg: No edema.  Skin:    General: Skin is warm and dry.     Comments: Left forearm, left flank -flesh-colored approximately 3 to 4 mm lesions with narrow base.  See photos.  No induration, no surrounding erythema.  Nontender.  Neurological:     Mental Status: He is alert and oriented to person, place, and time.  Psychiatric:        Mood and Affect: Mood normal.   Left flank picture #1, left forearm picture #2.     Risks (including but not limited to bleeding and infection), benefits, and alternatives discussed for skin tag removal x2 .  Verbal consent obtained after any questions were answered. Landmarks noted, and marked as needed. Area cleansed with Betadine, ethyl chloride spray for topical anesthesia, followed by removal with pickups, iris scissors. Hemostasis with pressure, then pressure bandage.  No complications.   RTC precautions discussed as well as aftercare.  Assessment & Plan:  Praveen Coia. is a 54 y.o. male . Essential hypertension - Plan: doxazosin  (CARDURA ) 8 MG tablet, verapamil  (CALAN -SR) 180 MG CR tablet  - Decreased control with some readings into the 90s at home, 130s over 80s, opportunity for slight improvement.  He will try increasing his olmesartan  to 40 mg daily with hypotension, orthostatic precautions given.  He will advise me on tolerability of that dose over the next week or 2 and I can send in the 40 mg dose if tolerated, otherwise we will send in refills of 20 mg.  No other med changes at this time.  Check labs.  Need for influenza vaccination - Plan: Flu vaccine trivalent PF, 6mos and older(Flulaval,Afluria,Fluarix,Fluzone), CANCELED: Flu vaccine HIGH DOSE PF(Fluzone Trivalent)  Gastroduodenal Crohn's disease with complication (HCC)  - Continue follow-up with gastroenterology, overall stable, adjusting diet and portions/timing of foods.  Body mass index (BMI) 45.0-49.9, adult (HCC)  - Low intensity activity/exercise has been discussed.  Check labs as  above.  Anhedonia - Plan: Ambulatory referral to Psychology  - New concern, may have a component of anhedonia versus underlying depression.  Request initial evaluation with therapist, referral placed to therapist he has requested.  Skin tags, multiple acquired - Plan: Surgical pathology  - With irritation on clothing, requests removal.  Option of in office removal versus dermatology eval and removal, he requested to have those removed here today.  We went through risk benefits and alternatives as above, no complications with removal of both lesions, forearm lesion sent for pathology but suspected benign skin tag.  Aftercare discussed, RTC precautions given.  Vitamin D  deficiency - Plan: Vitamin D , Ergocalciferol , (DRISDOL ) 1.25 MG (50000 UNIT) CAPS capsule  - Check labs and adjust plan accordingly, continue vitamin D  50,000 units weekly for now.  Prediabetes - Plan: Comprehensive metabolic panel with GFR, Lipid panel, Hemoglobin A1c  -  Updated labs and adjust plan accordingly.  Obesity discussed as above with low intensity exercise, dietary limitations due to his Crohn's and dumping syndrome.  Nocturia - Plan: doxazosin  (CARDURA ) 8 MG tablet  - Tolerating doxazosin , stable, no changes.  Meds ordered this encounter  Medications   doxazosin  (CARDURA ) 8 MG tablet    Sig: Take 1 tablet (8 mg total) by mouth at bedtime.    Dispense:  100 tablet    Refill:  2    Please send a replace/new response with 100-Day Supply if appropriate to maximize member benefit. Requesting 1 year supply.   verapamil  (CALAN -SR) 180 MG CR tablet    Sig: Take 2 tablets (360 mg total) by mouth daily.    Dispense:  200 tablet    Refill:  3    Please send a replace/new response with 100-Day Supply if appropriate to maximize member benefit. Requesting 1 year supply.   Vitamin D , Ergocalciferol , (DRISDOL ) 1.25 MG (50000 UNIT) CAPS capsule    Sig: Take 1 capsule (50,000 Units total) by mouth every 7 (seven) days.     Dispense:  16 capsule    Refill:  2    Please send a replace/new response with 100-Day Supply if appropriate to maximize member benefit. Requesting 1 year supply.   Patient Instructions  Goal blood pressure under 130/80.  With the elevated readings at home and some readings in the 90s I think it would be reasonable to try increasing the olmesartan  to 40 mg/day.  Try taking 2 of your current dosage and if you tolerate that dose okay, let me know and I will send in the 40 mg dose.  Watch for lightheadedness, dizziness or low blood pressures on that dose and if that occurs return to the 20 mg dose and let me know.  Either way let me know, and I will send in the refills.   I do think the 2 lesions removed were in tags, but I did send the forearm lesion for pathology review just to make sure since it had increased in size.  Again I think these are benign skin tags.  If any bleeding from the wound sites, apply pressure for 10 minutes.  I do not expect this to be an issue.  Clean area with soap and water, watch for any signs of infection including redness, discharge or increasing pain.  Again this would be unlikely.  Let me know if there are questions and take care.   If any concerns on labs I will let you know.    Signed,   Reyes Pines, MD [facility] 07/23/24 9:57 AM       [1]  Allergies Allergen Reactions   Zithromax [Azithromycin] Hypertension   Lisinopril  Swelling    SWELLING REACTION UNSPECIFIED    Tessalon  Perles [Benzonatate ]     hypertension   "

## 2024-07-23 NOTE — Patient Instructions (Addendum)
 Goal blood pressure under 130/80.  With the elevated readings at home and some readings in the 90s I think it would be reasonable to try increasing the olmesartan  to 40 mg/day.  Try taking 2 of your current dosage and if you tolerate that dose okay, let me know and I will send in the 40 mg dose.  Watch for lightheadedness, dizziness or low blood pressures on that dose and if that occurs return to the 20 mg dose and let me know.  Either way let me know, and I will send in the refills.   I do think the 2 lesions removed were in tags, but I did send the forearm lesion for pathology review just to make sure since it had increased in size.  Again I think these are benign skin tags.  If any bleeding from the wound sites, apply pressure for 10 minutes.  I do not expect this to be an issue.  Clean area with soap and water, watch for any signs of infection including redness, discharge or increasing pain.  Again this would be unlikely.  Let me know if there are questions and take care.   If any concerns on labs I will let you know.

## 2024-07-25 ENCOUNTER — Ambulatory Visit: Admitting: Podiatry

## 2024-07-25 ENCOUNTER — Encounter: Payer: Self-pay | Admitting: Family Medicine

## 2024-07-25 DIAGNOSIS — M79674 Pain in right toe(s): Secondary | ICD-10-CM

## 2024-07-25 DIAGNOSIS — M79675 Pain in left toe(s): Secondary | ICD-10-CM | POA: Diagnosis not present

## 2024-07-25 DIAGNOSIS — B351 Tinea unguium: Secondary | ICD-10-CM | POA: Diagnosis not present

## 2024-07-25 LAB — SURGICAL PATHOLOGY

## 2024-07-25 MED ORDER — TERBINAFINE HCL 250 MG PO TABS: 250.0000 mg | ORAL_TABLET | Freq: Every day | ORAL | 0 refills | Status: AC

## 2024-07-25 NOTE — Progress Notes (Signed)
" ° °  Subjective:    HPI Presents for follow-up onychomycosis treatment with p.o. Lamisil .  No problems taking medicine with no side effects noted.  Tenderness around toes with walking and wearing shoes.   Objective:  Physical Exam   General: AAO x3, NAD  Vascular: DP and PT pulses palpable bilaterally.  Immedate capillary fill time digits. No significant lower extremity edema bilaterally.  Dermatological: Onychomycotic mycotic changes nails 1 through 5 with discoloration nail and subungual debris and thickening of the nail. 0% Clearance of onychomycotic nail changes noted. Tenderness with walking and wearing shoes.  Neruologic: Grossly intact B/L  Musculoskeletal:   Assessment:  Painful onychomycotic nails 1 through 5 bilaterally. Pain feet b/l     Plan:  -Established office visit level 3 for evaluation and management -Patient is tolerating Lamisil  treatment for onychomycotic nails well.  No side effects noted. Will continue this treatment. -Rx: Lamisil  250 mg p.o. daily - Had CMP's done on July 23, 2024.  Liver enzymes on the CMP were normal - Return in 6 weeks for  Lamisil  final  "

## 2024-07-26 ENCOUNTER — Ambulatory Visit: Payer: Self-pay | Admitting: Family Medicine

## 2024-07-28 NOTE — Telephone Encounter (Signed)
 Update on increasing olmasartan.

## 2024-08-07 ENCOUNTER — Ambulatory Visit: Admitting: Family Medicine

## 2024-09-05 ENCOUNTER — Ambulatory Visit: Admitting: Podiatry

## 2024-12-31 ENCOUNTER — Ambulatory Visit: Admitting: Family Medicine
